# Patient Record
Sex: Female | Born: 1935 | Race: White | Hispanic: No | Smoking: Never smoker
Health system: Southern US, Community
[De-identification: ages and names within clinical notes are randomized; demographics above are authoritative.]

## PROBLEM LIST (undated history)

## (undated) DIAGNOSIS — N281 Cyst of kidney, acquired: Secondary | ICD-10-CM

## (undated) DIAGNOSIS — M4800 Spinal stenosis, site unspecified: Secondary | ICD-10-CM

## (undated) DIAGNOSIS — E119 Type 2 diabetes mellitus without complications: Secondary | ICD-10-CM

## (undated) DIAGNOSIS — F329 Major depressive disorder, single episode, unspecified: Secondary | ICD-10-CM

## (undated) DIAGNOSIS — E1140 Type 2 diabetes mellitus with diabetic neuropathy, unspecified: Secondary | ICD-10-CM

## (undated) DIAGNOSIS — I2510 Atherosclerotic heart disease of native coronary artery without angina pectoris: Secondary | ICD-10-CM

## (undated) DIAGNOSIS — I700 Atherosclerosis of aorta: Secondary | ICD-10-CM

## (undated) DIAGNOSIS — I519 Heart disease, unspecified: Secondary | ICD-10-CM

## (undated) DIAGNOSIS — R55 Syncope and collapse: Secondary | ICD-10-CM

## (undated) DIAGNOSIS — R32 Unspecified urinary incontinence: Secondary | ICD-10-CM

## (undated) DIAGNOSIS — R011 Cardiac murmur, unspecified: Secondary | ICD-10-CM

## (undated) DIAGNOSIS — I10 Essential (primary) hypertension: Secondary | ICD-10-CM

## (undated) DIAGNOSIS — M1990 Unspecified osteoarthritis, unspecified site: Secondary | ICD-10-CM

## (undated) DIAGNOSIS — D649 Anemia, unspecified: Secondary | ICD-10-CM

## (undated) DIAGNOSIS — R51 Headache: Secondary | ICD-10-CM

## (undated) DIAGNOSIS — I200 Unstable angina: Secondary | ICD-10-CM

## (undated) DIAGNOSIS — R112 Nausea with vomiting, unspecified: Secondary | ICD-10-CM

## (undated) DIAGNOSIS — Z955 Presence of coronary angioplasty implant and graft: Secondary | ICD-10-CM

## (undated) DIAGNOSIS — K219 Gastro-esophageal reflux disease without esophagitis: Secondary | ICD-10-CM

## (undated) DIAGNOSIS — G4730 Sleep apnea, unspecified: Secondary | ICD-10-CM

## (undated) DIAGNOSIS — F419 Anxiety disorder, unspecified: Secondary | ICD-10-CM

## (undated) DIAGNOSIS — Z951 Presence of aortocoronary bypass graft: Secondary | ICD-10-CM

## (undated) DIAGNOSIS — N3941 Urge incontinence: Secondary | ICD-10-CM

## (undated) DIAGNOSIS — G4700 Insomnia, unspecified: Secondary | ICD-10-CM

## (undated) DIAGNOSIS — R519 Headache, unspecified: Secondary | ICD-10-CM

## (undated) DIAGNOSIS — N8110 Cystocele, unspecified: Secondary | ICD-10-CM

## (undated) DIAGNOSIS — G4733 Obstructive sleep apnea (adult) (pediatric): Secondary | ICD-10-CM

## (undated) DIAGNOSIS — E669 Obesity, unspecified: Secondary | ICD-10-CM

## (undated) DIAGNOSIS — R0600 Dyspnea, unspecified: Secondary | ICD-10-CM

## (undated) DIAGNOSIS — N3020 Other chronic cystitis without hematuria: Secondary | ICD-10-CM

## (undated) DIAGNOSIS — A419 Sepsis, unspecified organism: Secondary | ICD-10-CM

## (undated) DIAGNOSIS — Z9889 Other specified postprocedural states: Secondary | ICD-10-CM

## (undated) DIAGNOSIS — I4891 Unspecified atrial fibrillation: Secondary | ICD-10-CM

## (undated) DIAGNOSIS — F32A Depression, unspecified: Secondary | ICD-10-CM

## (undated) DIAGNOSIS — E785 Hyperlipidemia, unspecified: Secondary | ICD-10-CM

## (undated) HISTORY — DX: Major depressive disorder, single episode, unspecified: F32.9

## (undated) HISTORY — PX: BUNIONECTOMY: SHX129

## (undated) HISTORY — DX: Obesity, unspecified: E66.9

## (undated) HISTORY — DX: Urge incontinence: N39.41

## (undated) HISTORY — DX: Gastro-esophageal reflux disease without esophagitis: K21.9

## (undated) HISTORY — PX: SEPTOPLASTY: SUR1290

## (undated) HISTORY — DX: Depression, unspecified: F32.A

## (undated) HISTORY — DX: Anxiety disorder, unspecified: F41.9

## (undated) HISTORY — PX: CHOLECYSTECTOMY: SHX55

## (undated) HISTORY — DX: Unspecified urinary incontinence: R32

## (undated) HISTORY — DX: Other chronic cystitis without hematuria: N30.20

## (undated) HISTORY — DX: Headache, unspecified: R51.9

## (undated) HISTORY — DX: Essential (primary) hypertension: I10

## (undated) HISTORY — DX: Headache: R51

## (undated) HISTORY — DX: Heart disease, unspecified: I51.9

## (undated) HISTORY — DX: Anemia, unspecified: D64.9

## (undated) HISTORY — DX: Unspecified osteoarthritis, unspecified site: M19.90

## (undated) HISTORY — DX: Hyperlipidemia, unspecified: E78.5

## (undated) HISTORY — PX: TOTAL KNEE ARTHROPLASTY: SHX125

## (undated) HISTORY — DX: Sleep apnea, unspecified: G47.30

## (undated) HISTORY — PX: CARDIAC SURGERY: SHX584

## (undated) HISTORY — DX: Cardiac murmur, unspecified: R01.1

## (undated) HISTORY — PX: JOINT REPLACEMENT: SHX530

## (undated) SURGERY — LOOP RECORDER INSERTION
Anesthesia: Moderate Sedation

---

## 1998-10-12 ENCOUNTER — Ambulatory Visit (HOSPITAL_COMMUNITY): Admission: RE | Admit: 1998-10-12 | Discharge: 1998-10-12 | Payer: Self-pay | Admitting: Obstetrics & Gynecology

## 2002-06-19 HISTORY — PX: BREAST BIOPSY: SHX20

## 2003-12-17 ENCOUNTER — Ambulatory Visit (HOSPITAL_COMMUNITY): Admission: RE | Admit: 2003-12-17 | Discharge: 2003-12-17 | Payer: Self-pay | Admitting: Neurology

## 2004-04-07 ENCOUNTER — Ambulatory Visit (HOSPITAL_COMMUNITY): Admission: RE | Admit: 2004-04-07 | Discharge: 2004-04-07 | Payer: Self-pay | Admitting: Neurology

## 2004-05-17 ENCOUNTER — Ambulatory Visit (HOSPITAL_COMMUNITY): Admission: RE | Admit: 2004-05-17 | Discharge: 2004-05-17 | Payer: Self-pay | Admitting: Neurology

## 2004-06-09 ENCOUNTER — Ambulatory Visit: Payer: Self-pay | Admitting: Internal Medicine

## 2004-07-14 ENCOUNTER — Ambulatory Visit: Payer: Self-pay | Admitting: Internal Medicine

## 2004-08-09 ENCOUNTER — Ambulatory Visit: Payer: Self-pay | Admitting: Obstetrics and Gynecology

## 2004-08-17 ENCOUNTER — Ambulatory Visit: Payer: Self-pay | Admitting: Internal Medicine

## 2005-10-03 ENCOUNTER — Ambulatory Visit: Payer: Self-pay | Admitting: Internal Medicine

## 2006-02-13 ENCOUNTER — Ambulatory Visit: Payer: Self-pay | Admitting: Gastroenterology

## 2006-10-10 ENCOUNTER — Ambulatory Visit: Payer: Self-pay | Admitting: Gastroenterology

## 2006-12-18 ENCOUNTER — Ambulatory Visit: Payer: Self-pay | Admitting: Internal Medicine

## 2008-01-03 ENCOUNTER — Ambulatory Visit: Payer: Self-pay | Admitting: Internal Medicine

## 2008-09-28 ENCOUNTER — Ambulatory Visit: Payer: Self-pay | Admitting: Specialist

## 2008-10-08 ENCOUNTER — Inpatient Hospital Stay: Payer: Self-pay | Admitting: Specialist

## 2009-01-11 ENCOUNTER — Ambulatory Visit: Payer: Self-pay | Admitting: Gastroenterology

## 2009-02-19 ENCOUNTER — Ambulatory Visit: Payer: Self-pay | Admitting: Internal Medicine

## 2009-08-21 ENCOUNTER — Inpatient Hospital Stay: Payer: Self-pay | Admitting: Internal Medicine

## 2010-04-07 ENCOUNTER — Ambulatory Visit: Payer: Self-pay | Admitting: Internal Medicine

## 2010-04-15 ENCOUNTER — Ambulatory Visit: Payer: Self-pay | Admitting: Internal Medicine

## 2010-08-05 ENCOUNTER — Ambulatory Visit: Payer: Self-pay | Admitting: Internal Medicine

## 2010-08-05 DIAGNOSIS — I2510 Atherosclerotic heart disease of native coronary artery without angina pectoris: Secondary | ICD-10-CM

## 2010-08-05 HISTORY — DX: Atherosclerotic heart disease of native coronary artery without angina pectoris: I25.10

## 2010-08-05 HISTORY — PX: CORONARY ANGIOPLASTY WITH STENT PLACEMENT: SHX49

## 2010-09-28 ENCOUNTER — Encounter: Payer: Self-pay | Admitting: Cardiology

## 2010-10-18 ENCOUNTER — Encounter: Payer: Self-pay | Admitting: Cardiology

## 2010-11-03 ENCOUNTER — Ambulatory Visit: Payer: Self-pay | Admitting: Specialist

## 2010-11-18 ENCOUNTER — Encounter: Payer: Self-pay | Admitting: Cardiology

## 2010-12-07 ENCOUNTER — Ambulatory Visit: Payer: Self-pay | Admitting: Pain Medicine

## 2010-12-15 ENCOUNTER — Ambulatory Visit: Payer: Self-pay | Admitting: Pain Medicine

## 2011-01-02 ENCOUNTER — Ambulatory Visit: Payer: Self-pay | Admitting: Pain Medicine

## 2011-01-17 ENCOUNTER — Ambulatory Visit: Payer: Self-pay | Admitting: Pain Medicine

## 2011-02-01 ENCOUNTER — Ambulatory Visit: Payer: Self-pay | Admitting: Pain Medicine

## 2011-02-18 ENCOUNTER — Inpatient Hospital Stay: Payer: Self-pay | Admitting: Internal Medicine

## 2011-06-26 ENCOUNTER — Ambulatory Visit: Payer: Self-pay | Admitting: Internal Medicine

## 2011-06-29 LAB — TROPONIN I
Troponin-I: <0.02 ng/mL
Troponin-I: <0.02 ng/mL

## 2011-06-29 LAB — COMPREHENSIVE METABOLIC PANEL
Albumin: 3.7 g/dL (ref 3.4–5.0)
Alkaline Phosphatase: 75 U/L (ref 50–136)
Anion Gap: 6 — ABNORMAL LOW (ref 7–16)
BUN: 13 mg/dL (ref 7–18)
Bilirubin,Total: 0.4 mg/dL (ref 0.2–1.0)
Calcium, Total: 8.8 mg/dL (ref 8.5–10.1)
Chloride: 95 mmol/L — ABNORMAL LOW (ref 98–107)
Co2: 29 mmol/L (ref 21–32)
Creatinine: 0.77 mg/dL (ref 0.60–1.30)
EGFR (African American): >60
EGFR (Non-African Amer.): >60
Glucose: 170 mg/dL — ABNORMAL HIGH (ref 65–99)
Osmolality: 265 (ref 275–301)
Potassium: 4.2 mmol/L (ref 3.5–5.1)
SGOT(AST): 16 U/L (ref 15–37)
SGPT (ALT): 20 U/L
Sodium: 130 mmol/L — ABNORMAL LOW (ref 136–145)
Total Protein: 7.1 g/dL (ref 6.4–8.2)

## 2011-06-29 LAB — CBC
HCT: 38.4 % (ref 35.0–47.0)
HGB: 12.7 g/dL (ref 12.0–16.0)
MCH: 26.3 pg (ref 26.0–34.0)
MCHC: 33 g/dL (ref 32.0–36.0)
MCV: 80 fL (ref 80–100)
Platelet: 186 10*3/uL (ref 150–440)
RBC: 4.81 10*6/uL (ref 3.80–5.20)
RDW: 14.8 % — ABNORMAL HIGH (ref 11.5–14.5)
WBC: 6.9 10*3/uL (ref 3.6–11.0)

## 2011-06-29 LAB — CK TOTAL AND CKMB (NOT AT ARMC)
CK, Total: 71 U/L (ref 21–215)
CK-MB: 0.8 ng/mL (ref 0.5–3.6)

## 2011-06-30 LAB — BASIC METABOLIC PANEL
Anion Gap: 10 (ref 7–16)
BUN: 14 mg/dL (ref 7–18)
Calcium, Total: 8.8 mg/dL (ref 8.5–10.1)
Chloride: 101 mmol/L (ref 98–107)
Co2: 28 mmol/L (ref 21–32)
Creatinine: 0.6 mg/dL (ref 0.60–1.30)
EGFR (African American): >60
EGFR (Non-African Amer.): >60
Glucose: 111 mg/dL — ABNORMAL HIGH (ref 65–99)
Osmolality: 279 (ref 275–301)
Potassium: 3.7 mmol/L (ref 3.5–5.1)
Sodium: 139 mmol/L (ref 136–145)

## 2011-06-30 LAB — CBC WITH DIFFERENTIAL/PLATELET
Basophil #: 0 10*3/uL (ref 0.0–0.1)
Basophil %: 0.5 %
Eosinophil #: 0.2 10*3/uL (ref 0.0–0.7)
Eosinophil %: 5.5 %
HCT: 38.2 % (ref 35.0–47.0)
HGB: 12.5 g/dL (ref 12.0–16.0)
Lymphocyte #: 1.6 10*3/uL (ref 1.0–3.6)
Lymphocyte %: 36 %
MCH: 26.1 pg (ref 26.0–34.0)
MCHC: 32.7 g/dL (ref 32.0–36.0)
MCV: 80 fL (ref 80–100)
Monocyte #: 0.5 10*3/uL (ref 0.0–0.7)
Monocyte %: 10.9 %
Neutrophil #: 2.1 10*3/uL (ref 1.4–6.5)
Neutrophil %: 47.1 %
Platelet: 186 10*3/uL (ref 150–440)
RBC: 4.79 10*6/uL (ref 3.80–5.20)
RDW: 15.3 % — ABNORMAL HIGH (ref 11.5–14.5)
WBC: 4.4 10*3/uL (ref 3.6–11.0)

## 2011-06-30 LAB — TROPONIN I: Troponin-I: <0.02 ng/mL

## 2011-06-30 LAB — LIPID PANEL
Cholesterol: 141 mg/dL (ref 0–200)
HDL Cholesterol: 63 mg/dL — ABNORMAL HIGH (ref 40–60)
Ldl Cholesterol, Calc: 64 mg/dL (ref 0–100)
Triglycerides: 71 mg/dL (ref 0–200)
VLDL Cholesterol, Calc: 14 mg/dL (ref 5–40)

## 2011-06-30 LAB — HEMOGLOBIN A1C: Hemoglobin A1C: 7.7 % — ABNORMAL HIGH (ref 4.2–6.3)

## 2011-06-30 LAB — CK TOTAL AND CKMB (NOT AT ARMC)
CK, Total: 54 U/L (ref 21–215)
CK-MB: <0.5 ng/mL — ABNORMAL LOW (ref 0.5–3.6)

## 2011-07-01 ENCOUNTER — Inpatient Hospital Stay: Payer: Self-pay | Admitting: Internal Medicine

## 2011-07-03 HISTORY — PX: LEFT HEART CATH AND CORONARY ANGIOGRAPHY: CATH118249

## 2011-07-03 LAB — BASIC METABOLIC PANEL
Anion Gap: 10 (ref 7–16)
BUN: 12 mg/dL (ref 7–18)
Calcium, Total: 8.9 mg/dL (ref 8.5–10.1)
Chloride: 103 mmol/L (ref 98–107)
Co2: 26 mmol/L (ref 21–32)
Creatinine: 0.69 mg/dL (ref 0.60–1.30)
EGFR (African American): >60
EGFR (Non-African Amer.): >60
Glucose: 127 mg/dL — ABNORMAL HIGH (ref 65–99)
Osmolality: 279 (ref 275–301)
Potassium: 4 mmol/L (ref 3.5–5.1)
Sodium: 139 mmol/L (ref 136–145)

## 2012-01-12 ENCOUNTER — Ambulatory Visit: Payer: Self-pay | Admitting: Gastroenterology

## 2012-02-08 ENCOUNTER — Ambulatory Visit: Payer: Self-pay | Admitting: Gastroenterology

## 2012-02-14 LAB — PATHOLOGY REPORT

## 2012-02-22 ENCOUNTER — Emergency Department: Payer: Self-pay | Admitting: Emergency Medicine

## 2012-02-22 LAB — CBC WITH DIFFERENTIAL/PLATELET
Basophil #: 0 10*3/uL (ref 0.0–0.1)
Basophil %: 0.3 %
Eosinophil #: 0 10*3/uL (ref 0.0–0.7)
Eosinophil %: 0 %
HCT: 39.4 % (ref 35.0–47.0)
HGB: 13.3 g/dL (ref 12.0–16.0)
Lymphocyte #: 0.6 10*3/uL — ABNORMAL LOW (ref 1.0–3.6)
Lymphocyte %: 5.8 %
MCH: 26.1 pg (ref 26.0–34.0)
MCHC: 33.8 g/dL (ref 32.0–36.0)
MCV: 77 fL — ABNORMAL LOW (ref 80–100)
Monocyte #: 1 x10 3/mm — ABNORMAL HIGH (ref 0.2–0.9)
Monocyte %: 9.1 %
Neutrophil #: 9.4 10*3/uL — ABNORMAL HIGH (ref 1.4–6.5)
Neutrophil %: 84.8 %
Platelet: 189 10*3/uL (ref 150–440)
RBC: 5.11 10*6/uL (ref 3.80–5.20)
RDW: 15.2 % — ABNORMAL HIGH (ref 11.5–14.5)
WBC: 11.1 10*3/uL — ABNORMAL HIGH (ref 3.6–11.0)

## 2012-02-22 LAB — URINALYSIS, COMPLETE
Bacteria: NONE SEEN
Bilirubin,UR: NEGATIVE
Glucose,UR: NEGATIVE mg/dL (ref 0–75)
Nitrite: NEGATIVE
Ph: 6 (ref 4.5–8.0)
Protein: NEGATIVE
RBC,UR: 10 /HPF (ref 0–5)
Specific Gravity: 1.01 (ref 1.003–1.030)
Squamous Epithelial: NONE SEEN
WBC UR: 110 /HPF (ref 0–5)

## 2012-02-22 LAB — BASIC METABOLIC PANEL
Anion Gap: 10 (ref 7–16)
BUN: 6 mg/dL — ABNORMAL LOW (ref 7–18)
Calcium, Total: 9.1 mg/dL (ref 8.5–10.1)
Chloride: 91 mmol/L — ABNORMAL LOW (ref 98–107)
Co2: 27 mmol/L (ref 21–32)
Creatinine: 0.65 mg/dL (ref 0.60–1.30)
EGFR (African American): >60
EGFR (Non-African Amer.): >60
Glucose: 192 mg/dL — ABNORMAL HIGH (ref 65–99)
Osmolality: 260 (ref 275–301)
Potassium: 3.2 mmol/L — ABNORMAL LOW (ref 3.5–5.1)
Sodium: 128 mmol/L — ABNORMAL LOW (ref 136–145)

## 2012-02-23 LAB — URINE CULTURE

## 2012-03-11 DIAGNOSIS — N3941 Urge incontinence: Secondary | ICD-10-CM | POA: Insufficient documentation

## 2012-03-11 DIAGNOSIS — N3020 Other chronic cystitis without hematuria: Secondary | ICD-10-CM | POA: Insufficient documentation

## 2012-06-06 ENCOUNTER — Ambulatory Visit: Payer: Self-pay | Admitting: Ophthalmology

## 2012-06-10 ENCOUNTER — Ambulatory Visit: Payer: Self-pay | Admitting: Ophthalmology

## 2012-06-10 LAB — POTASSIUM: Potassium: 4.2 mmol/L (ref 3.5–5.1)

## 2012-06-17 ENCOUNTER — Ambulatory Visit: Payer: Self-pay | Admitting: Ophthalmology

## 2012-07-09 ENCOUNTER — Ambulatory Visit: Payer: Self-pay | Admitting: Ophthalmology

## 2012-07-09 LAB — POTASSIUM: Potassium: 4.5 mmol/L (ref 3.5–5.1)

## 2012-07-15 ENCOUNTER — Ambulatory Visit: Payer: Self-pay | Admitting: Ophthalmology

## 2012-09-03 ENCOUNTER — Ambulatory Visit: Payer: Self-pay | Admitting: Internal Medicine

## 2013-05-02 DIAGNOSIS — N281 Cyst of kidney, acquired: Secondary | ICD-10-CM | POA: Insufficient documentation

## 2013-11-06 ENCOUNTER — Ambulatory Visit: Payer: Self-pay | Admitting: Internal Medicine

## 2013-11-26 ENCOUNTER — Ambulatory Visit: Payer: Self-pay | Admitting: Otolaryngology

## 2013-12-01 DIAGNOSIS — E669 Obesity, unspecified: Secondary | ICD-10-CM | POA: Insufficient documentation

## 2013-12-01 DIAGNOSIS — I10 Essential (primary) hypertension: Secondary | ICD-10-CM | POA: Insufficient documentation

## 2013-12-16 ENCOUNTER — Ambulatory Visit: Payer: Self-pay | Admitting: Otolaryngology

## 2014-01-06 DIAGNOSIS — G4730 Sleep apnea, unspecified: Secondary | ICD-10-CM | POA: Insufficient documentation

## 2014-01-06 DIAGNOSIS — G4733 Obstructive sleep apnea (adult) (pediatric): Secondary | ICD-10-CM | POA: Insufficient documentation

## 2014-01-06 DIAGNOSIS — I2510 Atherosclerotic heart disease of native coronary artery without angina pectoris: Secondary | ICD-10-CM | POA: Insufficient documentation

## 2014-10-09 NOTE — Op Note (Signed)
DATE OF BIRTH:  Dec 02, 1935  DATE OF PROCEDURE:  07/15/2012  PREOPERATIVE DIAGNOSIS:  Cataract, right eye.   POSTOPERATIVE DIAGNOSIS:  Cataract, right eye.   PROCEDURE PERFORMED:  Extracapsular cataract extraction using phacoemulsification with placement of an Alcon SN6CWS 15.5-diopter posterior chamber lens, serial number [id number].   ANESTHESIA:  Four percent lidocaine, 0.75% Marcaine, a 50-50 mixture with 10 units/mL of Hylenex added, given as a peribulbar.   ANESTHESIOLOGIST:  Dr. Boston Service.   COMPLICATIONS:  None.   ESTIMATED BLOOD LOSS:  Less than 1 mL.   DESCRIPTION OF PROCEDURE:  The patient was brought to the operating room and given a peribulbar block.  The patient was then prepped and draped in the usual fashion.  The vertical rectus muscles were imbricated using 5-0 silk sutures.  These sutures were then clamped to the sterile drapes as bridle sutures.  A limbal peritomy was performed extending two clock hours and hemostasis was obtained with cautery.  A partial thickness scleral groove was made at the surgical limbus and dissected anteriorly in a lamellar dissection using an Alcon crescent knife.  The anterior chamber was entered superonasally with a Superblade and through the lamellar dissection with a 2.6 mm keratome.  DisCoVisc was used to replace the aqueous and a continuous tear capsulorrhexis was carried out.  Hydrodissection and hydrodelineation were carried out with balanced salt and a 27 gauge canula.  The nucleus was rotated to confirm the effectiveness of the hydrodissection.  Phacoemulsification was carried out using a divide-and-conquer technique.  Total ultrasound time was 1 minute and 42 seconds with an average power of 18.5 percent, CDE of 31.32.  Irrigation/aspiration was used to remove the residual cortex.  DisCoVisc was used to inflate the capsule and the internal incision was enlarged to 3 mm with the crescent knife.  The intraocular lens was folded and  inserted into the capsular bag using the AcrySert delivery system.  Irrigation/aspiration was used to remove the residual DisCoVisc.  Miostat was injected into the anterior chamber through the paracentesis track to inflate the anterior chamber and induce miosis.  One-tenth of a milliliter of cefuroxime was injected into the anterior chamber via the paracentesis.  The wound was checked for leaks and none were found. The conjunctiva was closed with cautery and the bridle sutures were removed.  Two drops of 0.3% Vigamox were placed on the eye.   An eye shield was placed on the eye.  The patient was discharged to the recovery room in good condition.    ____________________________ Loura Back Tykel Badie, MD sad:ms D: 07/15/2012 13:15:01 ET T: 07/15/2012 22:38:10 ET JOB#: [id number]  cc: Remo Lipps A. Luccas Towell, MD, <Dictator> Martie Lee MD ELECTRONICALLY SIGNED 07/22/2012 12:44

## 2014-10-09 NOTE — Op Note (Signed)
PATIENT NAME:  Brandi [name], Brandi [name] MR#:  [id number] DATE OF BIRTH:  04-06-1936  DATE OF PROCEDURE:  06/17/2012  PREOPERATIVE DIAGNOSIS: Cataract left eye.   POSTOPERATIVE DIAGNOSIS: Cataract left eye.   PROCEDURE PERFORMED: Extracapsular cataract extraction using phacoemulsification with placement of Alcon SN6CWS, 16.0 diopter posterior chamber lens, serial number [id number].   SURGEON: Brandi Back. Oyuki Hogan, M.D.   ANESTHESIA: 4% lidocaine and 0.75% Marcaine 50-50 mixture with 10 units/mL of Hylenex added given as a peribulbar.   ANESTHESIOLOGIST: Dr. Boston Service.   COMPLICATIONS: None.   ESTIMATED BLOOD LOSS: Less than 1 mL.   DESCRIPTION OF PROCEDURE:  The patient was brought to the operating room and given a peribulbar block.  The patient was then prepped and draped in the usual fashion.  The vertical rectus muscles were imbricated using 5-0 silk sutures.  These sutures were then clamped to the sterile drapes as bridle sutures.  A limbal peritomy was performed extending two clock hours and hemostasis was obtained with cautery.  A partial thickness scleral groove was made at the surgical limbus and dissected anteriorly in a lamellar dissection using an Alcon crescent knife.  The anterior chamber was entered supero-temporally with a Superblade and through the lamellar dissection with a 2.6 mm keratome.  DisCoVisc was used to replace the aqueous and a continuous tear capsulorrhexis was carried out.  Hydrodissection and hydrodelineation were carried out with balanced salt and a 27 gauge canula.  The nucleus was rotated to confirm the effectiveness of the hydrodissection.  Phacoemulsification was carried out using a divide-and-conquer technique.  Total ultrasound time was 1 minute and 11 seconds with an average power of 24.3%.  Irrigation/aspiration was used to remove the residual cortex.  DisCoVisc was used to inflate the capsule and the internal incision was enlarged to 3 mm with the crescent  knife.  The intraocular lens was folded and inserted into the capsular bag using the AcrySert delivery system.  Irrigation/aspiration was used to remove the residual DisCoVisc.  Miostat was injected into the anterior chamber through the paracentesis track to inflate the anterior chamber and induce miosis.  The wound was checked for leaks and none were found. The conjunctiva was closed with cautery and the bridle sutures were removed.  Two drops of 0.3% Vigamox were placed on the eye.   An eye shield was placed on the eye.  The patient was discharged to the recovery room in good condition.  CDE 28.82.  No suture was placed.   Keflex 0.1 mL was introduced into the anterior chamber with the 27-gauge cannula pointed underneath the capsular edge.    ____________________________ Brandi Back Cortavius Montesinos, MD sad:cs D: 06/17/2012 13:19:36 ET T: 06/17/2012 14:52:06 ET JOB#: [id number]  cc: Remo Lipps A. Amnah Breuer, MD, <Dictator> Martie Lee MD ELECTRONICALLY SIGNED 06/24/2012 13:46

## 2014-10-11 NOTE — Consult Note (Signed)
PATIENT NAME:  Brandi [name], MURREN MR#:  [id number] DATE OF BIRTH:  1936/01/24  DATE OF CONSULTATION:  06/30/2011  REFERRING PHYSICIAN:   CONSULTING PHYSICIAN:  Isaias Cowman, MD  PRIMARY CARE PHYSICIAN: Dr. Gilford Rile  CARDIOLOGIST: Dr. Ubaldo Glassing.   CHIEF COMPLAINT: Chest pain.   HISTORY OF PRESENT ILLNESS: The patient is a 79 year old female referred for evaluation of chest pain and positive stress test. The patient has known coronary artery disease, status post PCI and stent to proximal left anterior descending coronary artery in 2010. The patient reports that she was in her usual state of health until 06/28/2011 when she developed substernal chest pain described as pressure sensation associated with nausea without radiation. She presented to [facility] Room where EKG was nondiagnostic. The patient ruled out for myocardial infarction by CPK isoenzymes and troponin. The patient underwent Lexiscan sestamibi study today which revealed evidence for anterior wall ischemia.   PAST MEDICAL HISTORY:  1. Status post Xience 3.0 x 23 mm drug-eluting stent proximal left anterior descending coronary artery in 2010.  2. Hypertension.  3. Hyperlipidemia.  4. Diabetes.  5. Hypothyroidism.  6. Obesity.    MEDICATIONS ON ADMISSION:  1. Aspirin 81 mg daily.  2. Plavix 75 mg daily.  3. Diovan 160 mg daily.  4. Klor-Con 20 milliequivalents daily.  5. Simvastatin 40 mg daily.  6. Verapamil 30 mg daily.  7. Alprazolam 0.25 mg b.i.d. p.r.n.  8. Biotin 1 tab b.i.d.  9. Calcium 600 mg with vitamin D b.i.d.  10. Lyrica 75 mg b.i.d.  11. Meloxicam 15 mg daily.  12. Metformin 500 mg daily.  13. Oxybutynin 15 mg daily.  14. PreserVision 1 tab b.i.d.  15. Vitamin C 1500 mg daily.   SOCIAL HISTORY: The patient is a widow. She currently lives with her sons and mother. She denies tobacco abuse.   FAMILY HISTORY: No immediate family history for coronary artery disease or myocardial infarction.   REVIEW OF  SYSTEMS: CONSTITUTIONAL: No fever or chills. EYES: No blurry vision. EARS: No hearing loss. RESPIRATORY: No shortness of breath. CARDIOVASCULAR: Chest pain as described above. GASTROINTESTINAL: Mild nausea. GENITOURINARY: The patient denies dysuria or hematuria. ENDOCRINE: The patient denies polyuria or polydipsia. MUSCULOSKELETAL: The patient has arthritis but denies myalgias. NEUROLOGICAL: The patient does have disk disease and neuropathy in both arms. PSYCHIATRIC: The patient denies anxiety or depression.   PHYSICAL EXAMINATION:  VITAL SIGNS: Blood pressure 166/82, pulse 66, respirations 20, temperature 97.8, pulse oximetry 94%.   HEENT: Pupils equal and reactive to light and accommodation.   NECK: Supple without thyromegaly.   LUNGS: Clear.   HEART: Normal jugular venous pressure. Normal point of maximal impulse. Regular rate and rhythm. Normal S1, S2. No appreciable gallop, murmur, or rub.   ABDOMEN: Soft and nontender. Pulses were intact bilaterally.   MUSCULOSKELETAL: Normal muscle tone.   NEUROLOGIC: The patient is alert and oriented x3. Motor and sensory both grossly intact.   IMPRESSION: This is a 79 year old female with known coronary artery disease who presents with chest pain, has ruled out for myocardial infarction. Lexiscan sestamibi study reveals evidence for anterior wall ischemia.   RECOMMENDATIONS:  1. Agree with current therapy.  2. Defer full dose anticoagulation at this time since the patient is chest pain free.  3. Proceed with cardiac catheterization with selective coronary arteriography on 07/03/2011. The risks, benefits, and alternatives were explained and informed written consent obtained.    ____________________________ Isaias Cowman, MD ap:rbg D: 06/30/2011 15:53:34 ET T: 06/30/2011 16:31:05 ET  JOB#: [id number]  cc: Isaias Cowman, MD, <Dictator> Isaias Cowman MD ELECTRONICALLY SIGNED 07/01/2011 10:04

## 2014-10-11 NOTE — H&P (Signed)
PATIENT NAME:  Brandi [name], Brandi [name] MR#:  [id number] DATE OF BIRTH:  17-Dec-1935  DATE OF ADMISSION:  06/29/2011  REFERRING PHYSICIAN: Fonnie Birkenhead, MD  PRIMARY CARE PHYSICIAN: Lisette Grinder III, MD  CHIEF COMPLAINT: Chest pain last night.   HISTORY OF PRESENT ILLNESS: The patient is a 79 year old Caucasian female with history of coronary artery disease status post stenting in the proximal LAD last year, hypertension, diabetes, and peptic ulcer disease who presents after being referred by her PCP to be evaluated in the ER as she had an episode of chest pain last night. The chest pain was in the sternum and it was more pressure like. The patient does not know how long it lasted and did not time it, however, she stated that it was not hours and it relieved by itself. She had some nausea at the same time however had radiation of this pain, no visual disturbances or dizziness. On arrival here her blood pressure was elevated. Currently her blood pressure is 187. She has no chest pain, shortness of breath, dizziness, or palpitations currently. The hospitalist services were contacted for admission and further evaluation.   PAST MEDICAL HISTORY:  1. Hyperlipidemia.  2. Peptic ulcer disease. 3. Fibrocystic disorder of the breast. 4. Obesity. 5. Urinary incontinence. 6. Macular degeneration. 7. Hypertension. 8. Coronary artery disease status post catheterization in 07/2008.   PAST SURGICAL HISTORY: 1. Right bunionectomy. 2. Nasal septal repair. 3. Laparoscopic cholecystectomy. 4. Bilateral knee replacement.   ALLERGIES: Accupril and Micardis.   CURRENT MEDICATIONS:  1. Alprazolam 0.25 mg twice a day as needed.  2. Aspirin 81 mg daily.  3. Biotin 1 tab p.o. twice a day. 4. Calcium 600 mg with vitamin D twice a day. 5. Diovan 160 mg daily.  6. Klor-Con 20 mEq p.o. twice a day. 7. Lyrica 75 mg twice a day. 8. Meloxicam 15 mg daily.  9. Metformin 500 mg daily.  10. Omeprazole 20 mg  daily. 11. Oxybutynin 15 mg daily, extended-release.  12. Plavix 75 mg daily.  13. PreserVision one tab twice a day. 14. Simvastatin 40 mg daily.  15. Verapamil 300 mg nightly.  16. Vitamin C 1500 mg daily.  17. Viibryd 20 mg at bedtime.   SOCIAL HISTORY: No tobacco, alcohol or drug use. She lives with her sons and mother. 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   FAMILY HISTORY: Cancer and mother with diabetes. Father died at a young age in an airplane crash.  REVIEW OF SYSTEMS: CONSTITUTIONAL: No fever or fatigue. No weakness or weight changes. EYES: No blurry vision or double vision. Has history of macular degeneration. ENT: No tinnitus or hearing loss. RESPIRATORY: No cough, wheezing, or hemoptysis. CARDIOVASCULAR: Chest pain as above. No orthopnea or edema. No arrhythmia or dyspnea on exertion. GASTROINTESTINAL: No nausea, vomiting, diarrhea, or abdominal pain. GENITOURINARY: The patient has a history of incontinence. No dysuria or hematuria. ENDOCRINE: No polyuria, nocturia, or thyroid problems. HEME/LYMPH: No anemia or easy bruising. SKIN: No rashes. MUSCULOSKELETAL: Chronic arthritis. PSYCH: No anxiety. There is history of depression.     PHYSICAL EXAMINATION:   VITAL SIGNS: Temperature 98, respiratory rate 18, pulse 70, blood pressure on arrival 183/86 and currently 187/ 94, and oxygen saturation 100% on room air.   GENERAL: The patient is a pleasant elderly female sitting in bed in no obvious distress.   HEENT: Normocephalic, atraumatic. Pupils are equal and reactive. Anicteric sclerae. Moist mucous membranes.   NECK: Supple. No thyroid tenderness.   HEART: S1 and S2  regular rate and rhythm. No murmurs, rubs, or gallops.   LUNGS: Clear to auscultation. No wheezing.   ABDOMEN: Soft, nontender, and nondistended. Positive bowel sounds.   EXTREMITIES: No significant edema. There are some varicose veins.   NEUROLOGIC: Strength 5/5 in all extremities. Cranial nerves II through XII  grossly intact.   LABS/STUDIES: Glucose 170 sodium 130, potassium 4.2, and chloride 95. LFTs within normal limits. Troponin negative x1. WBC 6.9, hemoglobin 12.7, hematocrit 38.4, and platelets 186.   EKG: Normal sinus rhythm. No acute ST elevations or depressions.   ASSESSMENT AND PLAN: We have a 79 year old Caucasian female with coronary artery disease status post stenting last year on Plavix and aspirin with hypertension, diabetes, and hyperlipidemia who presents with chest pain last night which has resolved. She has had no further chest pain. She has a negative troponin and EKG which does not show any acute ST elevations or depressions. However, the patient has multiple risk factors and stated that the chest pain was just like her previous episode of chest pain last year which ended up with the patient having a stent. At this point we would admit the patient for observation and rule out myocardial infarction with cyclic cardiac markers. Would admit the patient to telemetry with monitoring for arrhythmias. The patient has had an echocardiogram in September of last year showing ejection fraction of 45 to 50% and we would not repeat that. If the patient does rule out for acute myocardial infarction, we would order a stress test which could be done tomorrow. Would check a lipid profile as well. Cardiology can be consulted if the patient does rule in for myocardial infarction. For high blood pressure we would start amlodipine and continue her other outpatient medications and add hydralazine IV as needed. I would hold hydrochlorothiazide given that it was recently added and the patient has hyponatremia now. I would check a BMP tomorrow to reevaluate the hyponatremia. This is the likely culprit of the hyponatremia and I believe hydrochlorothiazide can be held for now. I would continue the patient's other medications.   CODE STATUS: FULL CODE.   TOTAL TIME SPENT: 45 minutes.   ____________________________ Vivien Presto, MD sa:slb D: 06/29/2011 16:34:41 ET T: 06/29/2011 17:19:37 ET JOB#: [id number]  cc: Vivien Presto, MD, <Dictator> John B. Sarina Ser, MD Karel Jarvis [facility] MD ELECTRONICALLY SIGNED 06/30/2011 20:38

## 2014-10-11 NOTE — Consult Note (Signed)
Brief Consult Note: Diagnosis: CP, known CAD prior PCI and stent prox LAD with anterior wall ischemia on Lexiscan-sestamibi.   Patient was seen by consultant.   Consult note dictated.   Comments: REC  Agree with current therapy, cardiac cath 07/03/11.  Electronic Signatures: Isaias Cowman (MD)  (Signed 11-Jan-13 15:55)  Authored: Brief Consult Note   Last Updated: 11-Jan-13 15:55 by Isaias Cowman (MD)

## 2014-10-11 NOTE — Discharge Summary (Signed)
PATIENT NAME:  Brandi [name], Brandi [name] MR#:  [id number] DATE OF BIRTH:  May 28, 1936  DATE OF ADMISSION:  06/29/2011 DATE OF DISCHARGE:  07/04/2011  HISTORY OF PRESENT ILLNESS: Brandi [name] is a 79 year old white lady with known coronary artery disease who was sent to the emergency room after she called the office stating that she had had substernal chest pain overnight that felt like an elephant sitting on her chest. The patient was evaluated in the ER by the hospitalist and admitted for further evaluation.   PAST MEDICAL HISTORY:  1. Coronary artery disease with known stenting of the LAD in the past.  2. History of hypertension.  3. Hyperlipidemia.  4. Peptic ulcer disease. 5. Obesity.  6. Macular degeneration.  7. Urinary incontinence.   ALLERGIES: Accupril and Micardis.   MEDICATIONS ON ADMISSION:  1. Alprazolam 0.25 mg twice a day p.r.n.  2. Aspirin 81 mg daily.  3. Diovan 160 mg daily.  4. Klor-Con 20 mEq twice a day. 5. Lyrica 75 mg twice a day. 6. Meloxicam 15 mg daily.  7. Metformin 500 mg daily.  8. Omeprazole 20 mg daily.  9. Oxybutynin 15 mg extended release daily.  10. Plavix 75 mg daily.  11. PreserVision 1 tablet twice a day. 12. Simvastatin 40 mg at bedtime.  13. Verapamil ER 300 mg daily.  14. Vitamin C 1500 mg daily. 15. Viibryd 20 mg at bedtime.   ADMISSION PHYSICAL EXAMINATION: Temperature was 98, respiratory rate 18, pulse 70, and blood pressure was 183/86. Oxygen saturation was 100% on room air. Admission physical examination as described by the admitting physician was totally within normal limits.   LABS/STUDIES: Admission laboratory included a CBC which showed a hemoglobin of 12.5 with a hematocrit of 38.2. Admission comprehensive metabolic panel showed a random blood sugar of 170, a sodium of 130, and a chloride of 95. Hemoglobin A1c on admission was 7.7%. Admission lipid panel showed triglycerides to be 71, HDL cholesterol 63, and LDL cholesterol was 64.   HOSPITAL  COURSE: The patient was admitted to the regular medical floor where she was placed on telemetry. Serial cardiac enzymes were obtained, all of which were normal. The patient underwent stress Myoview which was interpreted as being positive. She was seen in consultation by cardiology and underwent repeat cardiac catheterization. Cardiac catheterization revealed a patent stent in the proximal LAD with 40 to 50% stenosis in the mid LAD. Continued medical management was recommended as the patient had been asymptomatic since admission. The patient had rather variable blood pressure during her hospitalization. She had been on a diuretic as an outpatient which was discontinued because of her hyponatremia. Her Diovan was increased during the hospitalization, but it was decided to go back to her regular dose at the time of discharge and follow up in the office.   DISCHARGE DIAGNOSIS: Acute angina pectoris.   PROCEDURE: Cardiac catheterization.   DISCHARGE DISPOSITION: The patient is being discharged on a diabetic diet with activity as tolerated. No changes were made in her medications. She will follow up with Dr. Ubaldo Glassing in 1 to 2 weeks. She already has an appointment to see me on 07/31/2011 which time we will recheck her blood pressure. ____________________________ Hewitt Blade. Sarina Ser, MD jbw:slb D: 07/04/2011 08:02:00 ET     T: 07/05/2011 07:55:43 ET        JOB#: [id number] Lottie Mussel III MD ELECTRONICALLY SIGNED 07/07/2011 13:39

## 2014-12-23 ENCOUNTER — Other Ambulatory Visit: Payer: Self-pay | Admitting: Internal Medicine

## 2014-12-23 DIAGNOSIS — D510 Vitamin B12 deficiency anemia due to intrinsic factor deficiency: Secondary | ICD-10-CM | POA: Insufficient documentation

## 2014-12-23 DIAGNOSIS — Z1231 Encounter for screening mammogram for malignant neoplasm of breast: Secondary | ICD-10-CM

## 2014-12-23 DIAGNOSIS — F325 Major depressive disorder, single episode, in full remission: Secondary | ICD-10-CM | POA: Insufficient documentation

## 2014-12-30 ENCOUNTER — Ambulatory Visit
Admission: RE | Admit: 2014-12-30 | Discharge: 2014-12-30 | Disposition: A | Discharge status: Home / Self Care | Payer: Medicare PPO | Source: Ambulatory Visit | Attending: Internal Medicine | Admitting: Internal Medicine

## 2014-12-30 DIAGNOSIS — Z1231 Encounter for screening mammogram for malignant neoplasm of breast: Secondary | ICD-10-CM | POA: Diagnosis not present

## 2015-01-16 ENCOUNTER — Other Ambulatory Visit: Payer: Self-pay

## 2015-01-16 ENCOUNTER — Emergency Department: Payer: Medicare PPO

## 2015-01-16 ENCOUNTER — Encounter: Payer: Self-pay | Admitting: Emergency Medicine

## 2015-01-16 ENCOUNTER — Emergency Department
Admission: EM | Admit: 2015-01-16 | Discharge: 2015-01-16 | Disposition: A | Discharge status: Home / Self Care | Payer: Medicare PPO | Attending: Emergency Medicine | Admitting: Emergency Medicine

## 2015-01-16 DIAGNOSIS — I10 Essential (primary) hypertension: Secondary | ICD-10-CM | POA: Insufficient documentation

## 2015-01-16 DIAGNOSIS — R110 Nausea: Secondary | ICD-10-CM | POA: Diagnosis present

## 2015-01-16 DIAGNOSIS — R55 Syncope and collapse: Secondary | ICD-10-CM | POA: Diagnosis not present

## 2015-01-16 DIAGNOSIS — E119 Type 2 diabetes mellitus without complications: Secondary | ICD-10-CM | POA: Insufficient documentation

## 2015-01-16 DIAGNOSIS — N3090 Cystitis, unspecified without hematuria: Secondary | ICD-10-CM | POA: Diagnosis not present

## 2015-01-16 HISTORY — DX: Type 2 diabetes mellitus without complications: E11.9

## 2015-01-16 HISTORY — DX: Essential (primary) hypertension: I10

## 2015-01-16 LAB — CBC
HEMATOCRIT: 38.4 % (ref 35.0–47.0)
Hemoglobin: 12.5 g/dL (ref 12.0–16.0)
MCH: 24 pg — AB (ref 26.0–34.0)
MCHC: 32.5 g/dL (ref 32.0–36.0)
MCV: 74 fL — ABNORMAL LOW (ref 80.0–100.0)
PLATELETS: 190 10*3/uL (ref 150–440)
RBC: 5.19 MIL/uL (ref 3.80–5.20)
RDW: 16 % — ABNORMAL HIGH (ref 11.5–14.5)
WBC: 9.2 10*3/uL (ref 3.6–11.0)

## 2015-01-16 LAB — URINALYSIS COMPLETE WITH MICROSCOPIC (ARMC ONLY)
BILIRUBIN URINE: NEGATIVE
Bacteria, UA: NONE SEEN
Glucose, UA: NEGATIVE mg/dL
HGB URINE DIPSTICK: NEGATIVE
Ketones, ur: NEGATIVE mg/dL
NITRITE: NEGATIVE
PH: 5 (ref 5.0–8.0)
PROTEIN: 100 mg/dL — AB
Specific Gravity, Urine: 1.013 (ref 1.005–1.030)
Squamous Epithelial / LPF: NONE SEEN

## 2015-01-16 LAB — BASIC METABOLIC PANEL
ANION GAP: 12 (ref 5–15)
BUN: 15 mg/dL (ref 6–20)
CHLORIDE: 99 mmol/L — AB (ref 101–111)
CO2: 25 mmol/L (ref 22–32)
Calcium: 9.3 mg/dL (ref 8.9–10.3)
Creatinine, Ser: 0.97 mg/dL (ref 0.44–1.00)
GFR calc non Af Amer: 55 mL/min — ABNORMAL LOW (ref >60)
Glucose, Bld: 243 mg/dL — ABNORMAL HIGH (ref 65–99)
Potassium: 4.2 mmol/L (ref 3.5–5.1)
Sodium: 136 mmol/L (ref 135–145)

## 2015-01-16 LAB — TROPONIN I: Troponin I: <0.03 ng/mL (ref <0.031)

## 2015-01-16 MED ORDER — SULFAMETHOXAZOLE-TRIMETHOPRIM 800-160 MG PO TABS: 1.0000 | ORAL_TABLET | Freq: Two times a day (BID) | ORAL | Status: DC

## 2015-01-16 MED ORDER — DEXTROSE 5 % IV SOLN
1.0000 g | Freq: Once | INTRAVENOUS | Status: AC
  Administered 2015-01-16: 1 g via INTRAVENOUS
  Filled 2015-01-16: qty 10

## 2015-01-16 NOTE — ED Notes (Signed)
Patient reports she had frequent urination on Thursday night. Developed dysuria on Friday. States she was at [facility] to see if she had UTI. Was standing in line waiting and had syncopal episode. Patient did hit head at time of incident. Also reports nausea.

## 2015-01-16 NOTE — Discharge Instructions (Signed)
Syncope °Syncope is a medical term for fainting or passing out. This means you lose consciousness and drop to the ground. People are generally unconscious for less than 5 minutes. You may have some muscle twitches for up to 15 seconds before waking up and returning to normal. Syncope occurs more often in older adults, but it can happen to anyone. While most causes of syncope are not dangerous, syncope can be a sign of a serious medical problem. It is important to seek medical care.  °CAUSES  °Syncope is caused by a sudden drop in blood flow to the brain. The specific cause is often not determined. Factors that can bring on syncope include: °· Taking medicines that lower blood pressure. °· Sudden changes in posture, such as standing up quickly. °· Taking more medicine than prescribed. °· Standing in one place for too long. °· Seizure disorders. °· Dehydration and excessive exposure to heat. °· Low blood sugar (hypoglycemia). °· Straining to have a bowel movement. °· Heart disease, irregular heartbeat, or other circulatory problems. °· Fear, emotional distress, seeing blood, or severe pain. °SYMPTOMS  °Right before fainting, you may: °· Feel dizzy or light-headed. °· Feel nauseous. °· See all white or all black in your field of vision. °· Have cold, clammy skin. °DIAGNOSIS  °Your health care provider will ask about your symptoms, perform a physical exam, and perform an electrocardiogram (ECG) to record the electrical activity of your heart. Your health care provider may also perform other heart or blood tests to determine the cause of your syncope which may include: °· Transthoracic echocardiogram (TTE). During echocardiography, sound waves are used to evaluate how blood flows through your heart. °· Transesophageal echocardiogram (TEE). °· Cardiac monitoring. This allows your health care provider to monitor your heart rate and rhythm in real time. °· Holter monitor. This is a portable device that records your  heartbeat and can help diagnose heart arrhythmias. It allows your health care provider to track your heart activity for several days, if needed. °· Stress tests by exercise or by giving medicine that makes the heart beat faster. °TREATMENT  °In most cases, no treatment is needed. Depending on the cause of your syncope, your health care provider may recommend changing or stopping some of your medicines. °HOME CARE INSTRUCTIONS °· Have someone stay with you until you feel stable. °· Do not drive, use machinery, or play sports until your health care provider says it is okay. °· Keep all follow-up appointments as directed by your health care provider. °· Lie down right away if you start feeling like you might faint. Breathe deeply and steadily. Wait until all the symptoms have passed. °· Drink enough fluids to keep your urine clear or pale yellow. °· If you are taking blood pressure or heart medicine, get up slowly and take several minutes to sit and then stand. This can reduce dizziness. °SEEK IMMEDIATE MEDICAL CARE IF:  °· You have a severe headache. °· You have unusual pain in the chest, abdomen, or back. °· You are bleeding from your mouth or rectum, or you have black or tarry stool. °· You have an irregular or very fast heartbeat. °· You have pain with breathing. °· You have repeated fainting or seizure-like jerking during an episode. °· You faint when sitting or lying down. °· You have confusion. °· You have trouble walking. °· You have severe weakness. °· You have vision problems. °If you fainted, call your local emergency services (911 in [place] Do not drive   yourself to the hospital.  MAKE SURE YOU:  Understand these instructions.  Will watch your condition.  Will get help right away if you are not doing well or get worse. Document Released: 06/05/2005 Document Revised: 06/10/2013 Document Reviewed: 08/04/2011 [facility] Patient Information 2015 [place], [place]. This information is not intended to replace  advice given to you by your health care provider. Make sure you discuss any questions you have with your health care provider.  Syncope Syncope means a person passes out (faints). The person usually wakes up in less than 5 minutes. It is important to seek medical care for syncope. HOME CARE  Have someone stay with you until you feel normal.  Do not drive, use machines, or play sports until your doctor says it is okay.  Keep all doctor visits as told.  Lie down when you feel like you might pass out. Take deep breaths. Wait until you feel normal before standing up.  Drink enough fluids to keep your pee (urine) clear or pale yellow.  If you take blood pressure or heart medicine, get up slowly. Take several minutes to sit and then stand. GET HELP RIGHT AWAY IF:   You have a severe headache.  You have pain in the chest, belly (abdomen), or back.  You are bleeding from the mouth or butt (rectum).  You have black or tarry poop (stool).  You have an irregular or very fast heartbeat.  You have pain with breathing.  You keep passing out, or you have shaking (seizures) when you pass out.  You pass out when sitting or lying down.  You feel confused.  You have trouble walking.  You have severe weakness.  You have vision problems. If you fainted, call your local emergency services (911 in [place] Do not drive yourself to the hospital. MAKE SURE YOU:   Understand these instructions.  Will watch your condition.  Will get help right away if you are not doing well or get worse. Document Released: 11/22/2007 Document Revised: 12/05/2011 Document Reviewed: 08/04/2011 [facility] Patient Information 2015 [place], [place]. This information is not intended to replace advice given to you by your health care provider. Make sure you discuss any questions you have with your health care provider.  Urinary Tract Infection Urinary tract infections (UTIs) can develop anywhere along your urinary  tract. Your urinary tract is your body's drainage system for removing wastes and extra water. Your urinary tract includes two kidneys, two ureters, a bladder, and a urethra. Your kidneys are a pair of bean-shaped organs. Each kidney is about the size of your fist. They are located below your ribs, one on each side of your spine. CAUSES Infections are caused by microbes, which are microscopic organisms, including fungi, viruses, and bacteria. These organisms are so small that they can only be seen through a microscope. Bacteria are the microbes that most commonly cause UTIs. SYMPTOMS  Symptoms of UTIs may vary by age and gender of the patient and by the location of the infection. Symptoms in young women typically include a frequent and intense urge to urinate and a painful, burning feeling in the bladder or urethra during urination. Older women and men are more likely to be tired, shaky, and weak and have muscle aches and abdominal pain. A fever may mean the infection is in your kidneys. Other symptoms of a kidney infection include pain in your back or sides below the ribs, nausea, and vomiting. DIAGNOSIS To diagnose a UTI, your caregiver will ask you about  your symptoms. Your caregiver also will ask to provide a urine sample. The urine sample will be tested for bacteria and white blood cells. White blood cells are made by your body to help fight infection. TREATMENT  Typically, UTIs can be treated with medication. Because most UTIs are caused by a bacterial infection, they usually can be treated with the use of antibiotics. The choice of antibiotic and length of treatment depend on your symptoms and the type of bacteria causing your infection. HOME CARE INSTRUCTIONS  If you were prescribed antibiotics, take them exactly as your caregiver instructs you. Finish the medication even if you feel better after you have only taken some of the medication.  Drink enough water and fluids to keep your urine clear  or pale yellow.  Avoid caffeine, tea, and carbonated beverages. They tend to irritate your bladder.  Empty your bladder often. Avoid holding urine for long periods of time.  Empty your bladder before and after sexual intercourse.  After a bowel movement, women should cleanse from front to back. Use each tissue only once. SEEK MEDICAL CARE IF:   You have back pain.  You develop a fever.  Your symptoms do not begin to resolve within 3 days. SEEK IMMEDIATE MEDICAL CARE IF:   You have severe back pain or lower abdominal pain.  You develop chills.  You have nausea or vomiting.  You have continued burning or discomfort with urination. MAKE SURE YOU:   Understand these instructions.  Will watch your condition.  Will get help right away if you are not doing well or get worse. Document Released: 03/15/2005 Document Revised: 12/05/2011 Document Reviewed: 07/14/2011 [facility] Patient Information 2015 [place], [place]. This information is not intended to replace advice given to you by your health care provider. Make sure you discuss any questions you have with your health care provider.

## 2015-01-16 NOTE — ED Notes (Signed)
Pt informed to return if any life threatening symptoms occur.  

## 2015-01-16 NOTE — ED Provider Notes (Signed)
[facility] Emergency Department Provider Note     Time seen: ----------------------------------------- 12:11 PM on 01/16/2015 -----------------------------------------    I have reviewed the triage vital signs and the nursing notes.   HISTORY  Chief Complaint Loss of Consciousness and Nausea    HPI Brandi [name] is a 79 y.o. female who presents ER after she had a syncopal episode while waiting in line. Patient was going to see the doctor for having frequent urination and some dysuria this week. She is going to see if she had a UTI. Also had some nausea. Patient denies any fevers chills other complaints, states she woke up just not feeling well. She's had a syncopal episode the past but was never formally diagnosed with anything.   Past Medical History  Diagnosis Date  . Hypertension   . Diabetes mellitus without complication     There are no active problems to display for this patient.   Past Surgical History  Procedure Laterality Date  . Breast biopsy Right 2004    neg  . Cardiac surgery      Stent  . Cholecystectomy    . Joint replacement      TKR Bilateral  . Septoplasty    . Bunionectomy      Allergies Micardis and Accupril  Social History History  Substance Use Topics  . Smoking status: Never Smoker   . Smokeless tobacco: Never Used  . Alcohol Use: No    Review of Systems Constitutional: Negative for fever. Eyes: Negative for visual changes. ENT: Negative for sore throat. Cardiovascular: Negative for chest pain. Positive for syncope Respiratory: Negative for shortness of breath. Gastrointestinal: Negative for abdominal pain, vomiting and diarrhea. Genitourinary: Positive for dysuria Musculoskeletal: Negative for back pain. Skin: Negative for rash. Neurological: Negative for headaches, focal weakness or numbness.  10-point ROS otherwise negative.  ____________________________________________   PHYSICAL  EXAM:  VITAL SIGNS: ED Triage Vitals  Enc Vitals Group     BP 01/16/15 1149 140/72 mmHg     Pulse Rate 01/16/15 1149 92     Resp 01/16/15 1149 20     Temp 01/16/15 1149 98.3 F (36.8 C)     Temp Source 01/16/15 1149 Oral     SpO2 01/16/15 1149 95 %     Weight 01/16/15 1149 185 lb (83.915 kg)     Height 01/16/15 1149 5\' 1"  (1.549 m)     Head Cir --      Peak Flow --      Pain Score 01/16/15 1150 5     Pain Loc --      Pain Edu? --      Excl. in [place]? --     Constitutional: Alert and oriented. Well appearing and in no distress. Eyes: Conjunctivae are normal. PERRL. Normal extraocular movements. ENT   Head: Normocephalic and atraumatic.   Nose: No congestion/rhinnorhea.   Mouth/Throat: Mucous membranes are moist.   Neck: No stridor. Cardiovascular: Normal rate, regular rhythm. Normal and symmetric distal pulses are present in all extremities. No murmurs, rubs, or gallops. Respiratory: Normal respiratory effort without tachypnea nor retractions. Breath sounds are clear and equal bilaterally. No wheezes/rales/rhonchi. Gastrointestinal: Soft and nontender. No distention. No abdominal bruits. Musculoskeletal: Nontender with normal range of motion in all extremities. No joint effusions.  No lower extremity tenderness nor edema. Neurologic:  Normal speech and language. No gross focal neurologic deficits are appreciated. Speech is normal. No gait instability. Skin:  Skin is warm, dry and intact. No  rash noted. Psychiatric: Mood and affect are normal. Speech and behavior are normal. Patient exhibits appropriate insight and judgment. ____________________________________________  EKG: Interpreted by me. Normal sinus rhythm, LVH, normal PR interval, normal QRS with, normal QT interval. Borderline evidence of LVH  ____________________________________________  ED COURSE:  Pertinent labs & imaging results that were available during my care of the patient were reviewed by me and  considered in my medical decision making (see chart for details). We'll check cardiac labs, UA and reevaluate. ____________________________________________    LABS (pertinent positives/negatives)  Labs Reviewed  BASIC METABOLIC PANEL - Abnormal; Notable for the following:    Chloride 99 (*)    Glucose, Bld 243 (*)    GFR calc non Af Amer 55 (*)    All other components within normal limits  CBC - Abnormal; Notable for the following:    MCV 74.0 (*)    MCH 24.0 (*)    RDW 16.0 (*)    All other components within normal limits  URINALYSIS COMPLETEWITH MICROSCOPIC ([facility] ONLY) - Abnormal; Notable for the following:    Color, Urine YELLOW (*)    APPearance TURBID (*)    Protein, ur 100 (*)    Leukocytes, UA 3+ (*)    All other components within normal limits  URINE CULTURE  TROPONIN I    RADIOLOGY   Chest x-ray is unremarkable  ____________________________________________  FINAL ASSESSMENT AND PLAN  Syncope, cystitis  Plan: Patient with labs and imaging as dictated above. No clear etiology for the syncopal event. She does have significant UTI received 1 g of IV Rocephin. She is feeling better, ambulating here without difficulty. I offered her admission again and she declined. She is advised to follow-up with her doctor in 2 days reevaluation.   Earleen Newport, MD   Earleen Newport, MD 01/16/15 [phone]

## 2015-01-18 LAB — URINE CULTURE: Culture: >=100000

## 2015-04-08 ENCOUNTER — Other Ambulatory Visit: Payer: Self-pay | Admitting: Orthopedic Surgery

## 2015-04-08 DIAGNOSIS — M25551 Pain in right hip: Secondary | ICD-10-CM

## 2015-04-09 ENCOUNTER — Ambulatory Visit
Admission: RE | Admit: 2015-04-09 | Discharge: 2015-04-09 | Disposition: A | Discharge status: Home / Self Care | Payer: Medicare PPO | Source: Ambulatory Visit | Attending: Orthopedic Surgery | Admitting: Orthopedic Surgery

## 2015-04-09 DIAGNOSIS — M25551 Pain in right hip: Secondary | ICD-10-CM

## 2015-04-09 DIAGNOSIS — W19XXXA Unspecified fall, initial encounter: Secondary | ICD-10-CM | POA: Diagnosis not present

## 2015-04-09 DIAGNOSIS — S32501A Unspecified fracture of right pubis, initial encounter for closed fracture: Secondary | ICD-10-CM | POA: Diagnosis not present

## 2015-04-09 DIAGNOSIS — S3210XA Unspecified fracture of sacrum, initial encounter for closed fracture: Secondary | ICD-10-CM | POA: Diagnosis not present

## 2015-04-26 ENCOUNTER — Emergency Department: Payer: Medicare PPO

## 2015-04-26 ENCOUNTER — Emergency Department
Admission: EM | Admit: 2015-04-26 | Discharge: 2015-04-26 | Disposition: A | Discharge status: Home / Self Care | Payer: Medicare PPO | Attending: Emergency Medicine | Admitting: Emergency Medicine

## 2015-04-26 DIAGNOSIS — S329XXS Fracture of unspecified parts of lumbosacral spine and pelvis, sequela: Secondary | ICD-10-CM

## 2015-04-26 DIAGNOSIS — Z7982 Long term (current) use of aspirin: Secondary | ICD-10-CM | POA: Diagnosis not present

## 2015-04-26 DIAGNOSIS — S3289XS Fracture of other parts of pelvis, sequela: Secondary | ICD-10-CM | POA: Insufficient documentation

## 2015-04-26 DIAGNOSIS — S0093XA Contusion of unspecified part of head, initial encounter: Secondary | ICD-10-CM

## 2015-04-26 DIAGNOSIS — Z791 Long term (current) use of non-steroidal anti-inflammatories (NSAID): Secondary | ICD-10-CM | POA: Diagnosis not present

## 2015-04-26 DIAGNOSIS — I10 Essential (primary) hypertension: Secondary | ICD-10-CM | POA: Insufficient documentation

## 2015-04-26 DIAGNOSIS — S0083XA Contusion of other part of head, initial encounter: Secondary | ICD-10-CM | POA: Diagnosis not present

## 2015-04-26 DIAGNOSIS — Y998 Other external cause status: Secondary | ICD-10-CM | POA: Diagnosis not present

## 2015-04-26 DIAGNOSIS — W19XXXA Unspecified fall, initial encounter: Secondary | ICD-10-CM

## 2015-04-26 DIAGNOSIS — E119 Type 2 diabetes mellitus without complications: Secondary | ICD-10-CM | POA: Insufficient documentation

## 2015-04-26 DIAGNOSIS — Z79899 Other long term (current) drug therapy: Secondary | ICD-10-CM | POA: Diagnosis not present

## 2015-04-26 DIAGNOSIS — W228XXA Striking against or struck by other objects, initial encounter: Secondary | ICD-10-CM | POA: Diagnosis not present

## 2015-04-26 DIAGNOSIS — S0990XA Unspecified injury of head, initial encounter: Secondary | ICD-10-CM | POA: Diagnosis present

## 2015-04-26 DIAGNOSIS — Y9289 Other specified places as the place of occurrence of the external cause: Secondary | ICD-10-CM | POA: Diagnosis not present

## 2015-04-26 DIAGNOSIS — X58XXXS Exposure to other specified factors, sequela: Secondary | ICD-10-CM | POA: Diagnosis not present

## 2015-04-26 DIAGNOSIS — Y9389 Activity, other specified: Secondary | ICD-10-CM | POA: Insufficient documentation

## 2015-04-26 MED ORDER — HYDROCODONE-ACETAMINOPHEN 5-325 MG PO TABS
1.0000 | ORAL_TABLET | Freq: Once | ORAL | Status: AC
  Administered 2015-04-26: 1 via ORAL
  Filled 2015-04-26: qty 1

## 2015-04-26 MED ORDER — HYDROCODONE-ACETAMINOPHEN 5-325 MG PO TABS: 1.0000 | ORAL_TABLET | Freq: Once | ORAL | Status: DC

## 2015-04-26 NOTE — ED Notes (Signed)
PT mechanical fall with walker today. Hit back left side of head, pt is on blood thinners. Pain to head only. Ambulatory since the fall, but unsteady. Pt also mentions that she has a fractured pelvis X5 weeks-from a fall. Pt denies LOC.

## 2015-04-26 NOTE — Discharge Instructions (Signed)
Your head CT looked okay. Your x-ray of the pelvis was negative for new fracture. Continue light activity as tolerated. Follow-up with orthopedics as needed for the pelvic fractures. Return to the emergency department if you have uncontrolled pain or other urgent concerns.  Contusion A contusion is a deep bruise. Contusions happen when an injury causes bleeding under the skin. Symptoms of bruising include pain, swelling, and discolored skin. The skin may turn blue, purple, or yellow. HOME CARE   Rest the injured area.  If told, put ice on the injured area.  Put ice in a plastic bag.  Place a towel between your skin and the bag.  Leave the ice on for 20 minutes, 2-3 times per day.  If told, put light pressure (compression) on the injured area using an elastic bandage. Make sure the bandage is not too tight. Remove it and put it back on as told by your doctor.  If possible, raise (elevate) the injured area above the level of your heart while you are sitting or lying down.  Take over-the-counter and prescription medicines only as told by your doctor. GET HELP IF:  Your symptoms do not get better after several days of treatment.  Your symptoms get worse.  You have trouble moving the injured area. GET HELP RIGHT AWAY IF:   You have very bad pain.  You have a loss of feeling (numbness) in a hand or foot.  Your hand or foot turns pale or cold.   This information is not intended to replace advice given to you by your health care provider. Make sure you discuss any questions you have with your health care provider.   Document Released: 11/22/2007 Document Revised: 02/24/2015 Document Reviewed: 10/21/2014 Elsevier Interactive Patient Education [employer].

## 2015-04-26 NOTE — ED Provider Notes (Signed)
[facility] Emergency Department Provider Note  ____________________________________________  Time seen: 2020 p.m.  I have reviewed the triage vital signs and the nursing notes.   HISTORY  Chief Complaint Fall     HPI Brandi [name] is a 79 y.o. female who sustained a pelvic fracture on the right approximate 5 weeks ago.  Today, she attempted to sit down on her walker, but it rolled out from underneath her. She landed on her buttocks and rolled backwards hitting the back of her head. This occurred at approximately 2 PM today.  She reports ongoing discomfort in the right pelvis. She denies any significant head pain or neck pain. She is very pleasant and interactive and laughs through much of the interview. No acute distress.  She denies loss of consciousness. She does take Plavix and aspirin and meloxicam.    Past Medical History  Diagnosis Date  . Hypertension   . Diabetes mellitus without complication ([place])     There are no active problems to display for this patient.   Past Surgical History  Procedure Laterality Date  . Breast biopsy Right 2004    neg  . Cardiac surgery      Stent  . Cholecystectomy    . Joint replacement      TKR Bilateral  . Septoplasty    . Bunionectomy      Current Outpatient Rx  Name  Route  Sig  Dispense  Refill  . aspirin EC 81 MG tablet   Oral   Take 81 mg by mouth daily.         . budesonide-formoterol (SYMBICORT) 80-4.5 MCG/ACT inhaler   Inhalation   Inhale 2 puffs into the lungs 2 (two) times daily.         . Calcium Polycarbophil (FIBER-LAX PO)   Oral   Take 1 tablet by mouth daily.         . clopidogrel (PLAVIX) 75 MG tablet   Oral   Take 75 mg by mouth daily.         Marland Kitchen CRANBERRY PO   Oral   Take 1 capsule by mouth daily.         . Cyanocobalamin (VITAMIN B-12 PO)   Oral   Take 1 tablet by mouth daily.         . meloxicam (MOBIC) 15 MG tablet   Oral   Take 15 mg by mouth  daily.         . Multiple Vitamins-Minerals (OCUVITE PRESERVISION PO)   Oral   Take 1 capsule by mouth 2 (two) times daily.         . pantoprazole (PROTONIX) 40 MG tablet   Oral   Take 40 mg by mouth daily.         . potassium chloride SA (K-DUR,KLOR-CON) 20 MEQ tablet   Oral   Take 20 mEq by mouth 2 (two) times daily.         . pregabalin (LYRICA) 75 MG capsule   Oral   Take 75 mg by mouth 2 (two) times daily.         . simvastatin (ZOCOR) 40 MG tablet   Oral   Take 40 mg by mouth daily at 6 PM.         . SitaGLIPtin-MetFORMIN HCl (JANUMET XR) 50-1000 MG TB24   Oral   Take 1 tablet by mouth 2 (two) times daily.         Marland Kitchen sulfamethoxazole-trimethoprim (BACTRIM DS) 800-160  MG per tablet   Oral   Take 1 tablet by mouth 2 (two) times daily.   20 tablet   0   . valsartan (DIOVAN) 160 MG tablet   Oral   Take 160 mg by mouth daily.         . Verapamil HCl CR 300 MG CP24   Oral   Take 1 capsule by mouth daily.           Allergies Micardis and Accupril  Family History  Problem Relation Age of Onset  . Breast cancer Cousin     Social History Social History  Substance Use Topics  . Smoking status: Never Smoker   . Smokeless tobacco: Never Used  . Alcohol Use: No    Review of Systems  Constitutional: Negative for fatigue. ENT: Negative for congestion. Cardiovascular: Negative for chest pain. Respiratory: Negative for cough. Gastrointestinal: Negative for abdominal pain, vomiting and diarrhea. Genitourinary: Negative for dysuria. Musculoskeletal: Recent pelvic fracture with her new fall today.. Skin: Negative for rash. Neurological: Negative for headache or focal weakness   10-point ROS otherwise negative.  ____________________________________________   PHYSICAL EXAM:  VITAL SIGNS: ED Triage Vitals  Enc Vitals Group     BP 04/26/15 1606 151/90 mmHg     Pulse Rate 04/26/15 1606 96     Resp 04/26/15 1606 18     Temp 04/26/15 1606  98.1 F (36.7 C)     Temp Source 04/26/15 1606 Oral     SpO2 04/26/15 1606 95 %     Weight 04/26/15 1606 200 lb (90.719 kg)     Height 04/26/15 1606 5\' 1"  (1.549 m)     Head Cir --      Peak Flow --      Pain Score 04/26/15 1607 5     Pain Loc --      Pain Edu? --      Excl. in [place]? --     Constitutional:  Alert and oriented. Well appearing and in no distress. Very personable. Laughing quite a bit during the examination, including with discomfort. ENT   Head: Normocephalic with a minor hematoma in the posterior area..   Nose: No congestion/rhinnorhea.       Mouth: No erythema, no swelling   Cervical: Nontender with normal spontaneous movement. Cardiovascular: Normal rate, regular rhythm, no murmur noted Respiratory:  Normal respiratory effort, no tachypnea.    Breath sounds are clear and equal bilaterally.  Gastrointestinal: Soft and nontender. No distention.  Back: No muscle spasm, no tenderness, no CVA tenderness. Musculoskeletal: No deformity noted. Normal range of motion in all extremities, but with discomfort in the right pelvis as she flexes the right knee..  No noted edema. Neurologic:  Communicative. Normal appearing spontaneous movement in all 4 extremities. No gross focal neurologic deficits are appreciated.  Skin:  Skin is warm, dry. No rash noted. Psychiatric: Mood and affect are normal. Speech and behavior are normal.  ____________________________________________ ____________________________________________    RADIOLOGY  CT head: IMPRESSION: No acute abnormality.  Mild atrophy and chronic microvascular ischemic change   Pelvic x-ray:  IMPRESSION: Stable right pubic rami fractures. No new/acute pelvic or hip fractures.  ____________________________________________  INITIAL IMPRESSION / ASSESSMENT AND PLAN / ED COURSE  Pertinent labs & imaging results that were available during my care of the patient were reviewed by me and considered in my medical  decision making (see chart for details).   Very pleasant 79 year old female in no acute distress. She has some  tenderness in the right pelvis, but no crepitus and no significant reduction in mobility. She laughs a moderate amount through this part of the exam.  Head CT is negative. X-ray of the pelvis is pending.  ----------------------------------------- 9:43 PM on 04/26/2015 -----------------------------------------  X-ray of the pelvis shows stable right pubic gray my fractures without any acute changes.  We've treated the patient's pain with a Vicodin. She feels little bit better. She is in no acute distress. We will discharge her home to continue recovery for her prior pelvic fractures.  ____________________________________________   FINAL CLINICAL IMPRESSION(S) / ED DIAGNOSES  Final diagnoses:  Fall, initial encounter  Head contusion, initial encounter  Pelvic fracture, sequela      Ahmed Prima, MD 04/26/15 2146

## 2015-06-30 ENCOUNTER — Other Ambulatory Visit: Payer: Self-pay | Admitting: Internal Medicine

## 2015-06-30 ENCOUNTER — Other Ambulatory Visit: Payer: Self-pay

## 2015-06-30 DIAGNOSIS — Z1231 Encounter for screening mammogram for malignant neoplasm of breast: Secondary | ICD-10-CM

## 2015-09-07 ENCOUNTER — Encounter: Payer: Self-pay | Admitting: *Deleted

## 2015-09-08 ENCOUNTER — Ambulatory Visit: Payer: Self-pay | Admitting: Urology

## 2015-09-09 ENCOUNTER — Ambulatory Visit (INDEPENDENT_AMBULATORY_CARE_PROVIDER_SITE_OTHER): Payer: Medicare Other | Admitting: Urology

## 2015-09-09 ENCOUNTER — Encounter: Payer: Self-pay | Admitting: Urology

## 2015-09-09 VITALS — BP 191/74 | HR 83 | Ht 61.0 in | Wt 205.9 lb

## 2015-09-09 DIAGNOSIS — R32 Unspecified urinary incontinence: Secondary | ICD-10-CM

## 2015-09-09 DIAGNOSIS — N390 Urinary tract infection, site not specified: Secondary | ICD-10-CM

## 2015-09-09 DIAGNOSIS — IMO0001 Reserved for inherently not codable concepts without codable children: Secondary | ICD-10-CM

## 2015-09-09 DIAGNOSIS — N8110 Cystocele, unspecified: Secondary | ICD-10-CM

## 2015-09-09 DIAGNOSIS — IMO0002 Reserved for concepts with insufficient information to code with codable children: Secondary | ICD-10-CM

## 2015-09-09 LAB — MICROSCOPIC EXAMINATION

## 2015-09-09 LAB — BLADDER SCAN AMB NON-IMAGING: SCAN RESULT: 0

## 2015-09-09 LAB — URINALYSIS, COMPLETE
Bilirubin, UA: NEGATIVE
GLUCOSE, UA: NEGATIVE
Ketones, UA: NEGATIVE
LEUKOCYTES UA: NEGATIVE
Nitrite, UA: NEGATIVE
Protein, UA: NEGATIVE
RBC, UA: NEGATIVE
Specific Gravity, UA: 1.01 (ref 1.005–1.030)
UUROB: 0.2 mg/dL (ref 0.2–1.0)
pH, UA: 6.5 (ref 5.0–7.5)

## 2015-09-09 MED ORDER — MIRABEGRON ER 25 MG PO TB24: 25.0000 mg | ORAL_TABLET | Freq: Every day | ORAL | Status: DC

## 2015-09-09 NOTE — Progress Notes (Signed)
09/09/2015 2:27 PM   [name] 1935/11/16 [id number]  Referring provider: Madelyn Brunner, MD [facility] [place], [place] [address]  Chief Complaint  Patient presents with  . Recurrent UTI    1 year recheck    HPI: Patient is 80 year old Caucasian female who has a history of recurrent urinary tract infections and incontinence that is controlled Myrbetriq 25 mg daily who presents today for a 1 year follow-up.  Patient has had 3 urinary tract infections over the past year.  One for Escherichia coli and 2 for Enterobacter.  They're treated with the appropriate antibiotics.  She is not complaining of urinary tract infection at today's visit.  She denies gross hematuria, dysuria and suprapubic pain.  She has not had any recent fevers, chills, nausea or vomiting.  Her UA today is unremarkable.  She is still continuing the Myrbetriq 25 mg daily with good control of her incontinence.  Her PVR on today's exam is 0 mL.  She does get up once a night to urinate.     PMH: Past Medical History  Diagnosis Date  . Hypertension   . Diabetes mellitus without complication ([place])   . Incontinence in female   . Chronic cystitis   . Anemia   . Arthritis, degenerative   . HTN (hypertension)   . Depression   . GERD (gastroesophageal reflux disease)   . Heart disease   . Anxiety   . Murmur   . HLD (hyperlipidemia)   . Sleep apnea   . Urge incontinence of urine   . Obesity   . Headache     Surgical History: Past Surgical History  Procedure Laterality Date  . Breast biopsy Right 2004    neg  . Cardiac surgery      Stent  . Cholecystectomy    . Joint replacement      TKR Bilateral  . Septoplasty    . Bunionectomy      Home Medications:    Medication List       This list is accurate as of: 09/09/15  2:27 PM.  Always use your most recent med list.               aspirin EC 81 MG tablet  Take 81 mg by mouth daily.     budesonide-formoterol 80-4.5 MCG/ACT inhaler  Commonly known as:  SYMBICORT  Inhale 2 puffs into the lungs 2 (two) times daily.     Butalbital-APAP-Caffeine 50-325-40 MG capsule  Take by mouth.     clopidogrel 75 MG tablet  Commonly known as:  PLAVIX  Take 75 mg by mouth daily.     CRANBERRY PO  Take 1 capsule by mouth daily.     FIBER-LAX PO  Take 1 tablet by mouth daily.     glipiZIDE 5 MG tablet  Commonly known as:  GLUCOTROL  5 mg 2 (two) times daily.     HYDROcodone-acetaminophen 5-325 MG tablet  Commonly known as:  NORCO/VICODIN  Take 1 tablet by mouth once.     JANUMET XR 50-1000 MG Tb24  Generic drug:  SitaGLIPtin-MetFORMIN HCl  Take 1 tablet by mouth 2 (two) times daily.     LANTUS SOLOSTAR 100 UNIT/ML Solostar Pen  Generic drug:  Insulin Glargine  Inject into the skin.     meloxicam 15 MG tablet  Commonly known as:  MOBIC  Take 15 mg by mouth daily.     metFORMIN 500 MG 24  hr tablet  Commonly known as:  GLUCOPHAGE-XR  Take 500 mg by mouth. Reported on 09/09/2015     mirabegron ER 25 MG Tb24 tablet  Commonly known as:  MYRBETRIQ  Take 1 tablet (25 mg total) by mouth daily.     OCUVITE PRESERVISION PO  Take 1 capsule by mouth 2 (two) times daily. Reported on 09/09/2015     pantoprazole 40 MG tablet  Commonly known as:  PROTONIX  Take 40 mg by mouth daily.     potassium chloride SA 20 MEQ tablet  Commonly known as:  K-DUR,KLOR-CON  Take 20 mEq by mouth 2 (two) times daily.     pregabalin 75 MG capsule  Commonly known as:  LYRICA  Take 75 mg by mouth 2 (two) times daily.     simvastatin 40 MG tablet  Commonly known as:  ZOCOR  Take 40 mg by mouth daily at 6 PM.     sulfamethoxazole-trimethoprim 800-160 MG tablet  Commonly known as:  BACTRIM DS  Take 1 tablet by mouth 2 (two) times daily.     valsartan 160 MG tablet  Commonly known as:  DIOVAN  Take 160 mg by mouth daily. Reported on 09/09/2015     Verapamil HCl CR 300 MG Cp24  Take 1 capsule  by mouth daily.     VIIBRYD 20 MG Tabs  Generic drug:  Vilazodone HCl  Take 40 mg by mouth.     VITAMIN B-12 PO  Take 1 tablet by mouth daily.        Allergies:  Allergies  Allergen Reactions  . Micardis [Telmisartan] Other (See Comments)    Dizziness  . Accupril [Quinapril Hcl] Rash  . Quinapril Rash    Family History: Family History  Problem Relation Age of Onset  . Breast cancer Cousin   . Skin cancer    . Diabetes    . Hypercholesterolemia    . Hypertension    . Kidney disease Neg Hx   . Bladder Cancer Maternal Aunt   . COPD      Social History:  reports that she has never smoked. She has never used smokeless tobacco. She reports that she does not drink alcohol or use illicit drugs.  ROS: UROLOGY Frequent Urination?: No Hard to postpone urination?: No Burning/pain with urination?: No Get up at night to urinate?: Yes Leakage of urine?: No Urine stream starts and stops?: No Trouble starting stream?: No Do you have to strain to urinate?: No Blood in urine?: No Urinary tract infection?: No Sexually transmitted disease?: No Injury to kidneys or bladder?: No Painful intercourse?: No Weak stream?: No Currently pregnant?: No Vaginal bleeding?: No Last menstrual period?: n  Gastrointestinal Nausea?: No Vomiting?: No Indigestion/heartburn?: No Diarrhea?: No Constipation?: No  Constitutional Fever: No Night sweats?: No Weight loss?: No Fatigue?: No  Skin Skin rash/lesions?: Yes Itching?: No  Eyes Blurred vision?: No Double vision?: No  Ears/Nose/Throat Sore throat?: No Sinus problems?: No  Hematologic/Lymphatic Swollen glands?: No Easy bruising?: No  Cardiovascular Leg swelling?: No Chest pain?: No  Respiratory Cough?: No Shortness of breath?: No  Endocrine Excessive thirst?: No  Musculoskeletal Back pain?: No Joint pain?: Yes  Neurological Headaches?: Yes Dizziness?: No  Psychologic Depression?: Yes Anxiety?:  No  Physical Exam: BP 191/74 mmHg  Pulse 83  Ht 5\' 1"  (1.549 m)  Wt 205 lb 14.4 oz (93.396 kg)  BMI 38.92 kg/m2  Constitutional: Well nourished. Alert and oriented, No acute distress. HEENT: [place] AT, moist mucus membranes.  Trachea midline, no masses. Cardiovascular: No clubbing, cyanosis, or edema. Respiratory: Normal respiratory effort, no increased work of breathing. GI: Abdomen is soft, non tender, non distended, no abdominal masses. Liver and spleen not palpable.  No hernias appreciated.  Stool sample for occult testing is not indicated.   GU: No CVA tenderness.  No bladder fullness or masses.  Atrophic external genitalia, normal pubic hair distribution, no lesions.  Normal urethral meatus, no lesions, no prolapse, no discharge.   Urethral caruncle.   No bladder fullness, tenderness or masses. Normal vagina mucosa, good estrogen effect, no discharge, no lesions, poor pelvic support, Grade III cystocele is noted.  Rectocele is  noted.  No cervical motion tenderness.  Uterus is freely mobile and non-fixed.  No adnexal/parametria masses or tenderness noted.  Anus and perineum are without rashes or lesions.    Skin: No rashes, bruises or suspicious lesions. Lymph: No cervical or inguinal adenopathy. Neurologic: Grossly intact, no focal deficits, moving all 4 extremities. Psychiatric: Normal mood and affect.  Laboratory Data: Lab Results  Component Value Date   WBC 9.2 01/16/2015   HGB 12.5 01/16/2015   HCT 38.4 01/16/2015   MCV 74.0* 01/16/2015   PLT 190 01/16/2015   Lab Results  Component Value Date   CREATININE 0.97 01/16/2015   Lab Results  Component Value Date   HGBA1C 7.7* 06/30/2011      Component Value Date/Time   CHOL 141 06/30/2011 0551   HDL 63* 06/30/2011 0551   VLDL 14 06/30/2011 0551   LDLCALC 64 06/30/2011 0551   Lab Results  Component Value Date   AST 16 06/29/2011   Lab Results  Component Value Date   ALT 20 06/29/2011     Urinalysis Microscopic  Examination  Result Value Ref Range   WBC, UA 0-5 0 -  5 /hpf   RBC, UA 0-2 0 -  2 /hpf   Epithelial Cells (non renal) 0-10 0 - 10 /hpf   Mucus, UA Present (A) Not Estab.   Bacteria, UA Few (A) None seen/Few  Urinalysis, Complete  Result Value Ref Range   Specific Gravity, UA 1.010 1.005 - 1.030   pH, UA 6.5 5.0 - 7.5   Color, UA Yellow Yellow   Appearance Ur Clear Clear   Leukocytes, UA Negative Negative   Protein, UA Negative Negative/Trace   Glucose, UA Negative Negative   Ketones, UA Negative Negative   RBC, UA Negative Negative   Bilirubin, UA Negative Negative   Urobilinogen, Ur 0.2 0.2 - 1.0 mg/dL   Nitrite, UA Negative Negative   Microscopic Examination See below:    Pertinent Imaging: Results for MAGALINE, KOSIOREK (MRN [id number]) as of 09/09/2015 14:24  Ref. Range 09/09/2015 14:09  Scan Result Unknown 0     Assessment & Plan:    1. Recurrent UTI:   Patient's UA was unremarkable, but she would like a culture sent to make sure her infection has cleared from January.  She did not find the vaginal estrogen cream helpful.  She will contact us if she should experience symptoms of an UTI.  - Urinalysis, Complete - CULTURE, URINE COMPREHENSIVE  2. Incontinence:   Patient's incontinence is well controlled on Myrbetriq 25 mg daily. She will continue that medication.  Refills are sent to her pharmacy.  She will follow up in one year for a PVR and UA.    - BLADDER SCAN AMB NON-IMAGING  3. Cystocele:   Manage conservatively at this time.  We will reassess  when she returns in one year.     Return in about 1 year (around 09/08/2016) for PVR and UA.  These notes generated with voice recognition software. I apologize for typographical errors.  Zara Council, [facility] [address] [place], [place] [address] [phone]

## 2015-09-11 LAB — CULTURE, URINE COMPREHENSIVE

## 2015-09-13 DIAGNOSIS — R32 Unspecified urinary incontinence: Secondary | ICD-10-CM | POA: Insufficient documentation

## 2015-09-13 DIAGNOSIS — IMO0002 Reserved for concepts with insufficient information to code with codable children: Secondary | ICD-10-CM

## 2015-09-13 DIAGNOSIS — N390 Urinary tract infection, site not specified: Secondary | ICD-10-CM | POA: Insufficient documentation

## 2015-09-13 DIAGNOSIS — IMO0001 Reserved for inherently not codable concepts without codable children: Secondary | ICD-10-CM | POA: Insufficient documentation

## 2015-10-01 ENCOUNTER — Encounter: Payer: Self-pay | Admitting: Emergency Medicine

## 2015-10-01 ENCOUNTER — Emergency Department: Payer: Medicare Other

## 2015-10-01 ENCOUNTER — Emergency Department
Admission: EM | Admit: 2015-10-01 | Discharge: 2015-10-01 | Disposition: A | Discharge status: Home / Self Care | Payer: Medicare Other | Attending: Emergency Medicine | Admitting: Emergency Medicine

## 2015-10-01 DIAGNOSIS — E669 Obesity, unspecified: Secondary | ICD-10-CM | POA: Diagnosis not present

## 2015-10-01 DIAGNOSIS — E119 Type 2 diabetes mellitus without complications: Secondary | ICD-10-CM | POA: Diagnosis not present

## 2015-10-01 DIAGNOSIS — M1990 Unspecified osteoarthritis, unspecified site: Secondary | ICD-10-CM | POA: Diagnosis not present

## 2015-10-01 DIAGNOSIS — F329 Major depressive disorder, single episode, unspecified: Secondary | ICD-10-CM | POA: Insufficient documentation

## 2015-10-01 DIAGNOSIS — R42 Dizziness and giddiness: Secondary | ICD-10-CM | POA: Diagnosis present

## 2015-10-01 DIAGNOSIS — Z7982 Long term (current) use of aspirin: Secondary | ICD-10-CM | POA: Insufficient documentation

## 2015-10-01 DIAGNOSIS — Z7984 Long term (current) use of oral hypoglycemic drugs: Secondary | ICD-10-CM | POA: Insufficient documentation

## 2015-10-01 DIAGNOSIS — I119 Hypertensive heart disease without heart failure: Secondary | ICD-10-CM | POA: Insufficient documentation

## 2015-10-01 DIAGNOSIS — Z79899 Other long term (current) drug therapy: Secondary | ICD-10-CM | POA: Diagnosis not present

## 2015-10-01 DIAGNOSIS — E785 Hyperlipidemia, unspecified: Secondary | ICD-10-CM | POA: Diagnosis not present

## 2015-10-01 DIAGNOSIS — J0120 Acute ethmoidal sinusitis, unspecified: Secondary | ICD-10-CM | POA: Insufficient documentation

## 2015-10-01 DIAGNOSIS — N390 Urinary tract infection, site not specified: Secondary | ICD-10-CM | POA: Insufficient documentation

## 2015-10-01 DIAGNOSIS — Z794 Long term (current) use of insulin: Secondary | ICD-10-CM | POA: Insufficient documentation

## 2015-10-01 LAB — URINALYSIS COMPLETE WITH MICROSCOPIC (ARMC ONLY)
BILIRUBIN URINE: NEGATIVE
Bacteria, UA: NONE SEEN
GLUCOSE, UA: NEGATIVE mg/dL
Hgb urine dipstick: NEGATIVE
NITRITE: NEGATIVE
Protein, ur: 30 mg/dL — AB
Specific Gravity, Urine: 1.017 (ref 1.005–1.030)
pH: 5 (ref 5.0–8.0)

## 2015-10-01 LAB — BASIC METABOLIC PANEL
ANION GAP: 10 (ref 5–15)
BUN: 19 mg/dL (ref 6–20)
CALCIUM: 8.9 mg/dL (ref 8.9–10.3)
CO2: 23 mmol/L (ref 22–32)
Chloride: 97 mmol/L — ABNORMAL LOW (ref 101–111)
Creatinine, Ser: 1.42 mg/dL — ABNORMAL HIGH (ref 0.44–1.00)
GFR calc Af Amer: 40 mL/min — ABNORMAL LOW (ref >60)
GFR, EST NON AFRICAN AMERICAN: 35 mL/min — AB (ref >60)
GLUCOSE: 275 mg/dL — AB (ref 65–99)
Potassium: 4.1 mmol/L (ref 3.5–5.1)
Sodium: 130 mmol/L — ABNORMAL LOW (ref 135–145)

## 2015-10-01 LAB — CBC
HCT: 38.7 % (ref 35.0–47.0)
HEMOGLOBIN: 12.6 g/dL (ref 12.0–16.0)
MCH: 24.6 pg — ABNORMAL LOW (ref 26.0–34.0)
MCHC: 32.5 g/dL (ref 32.0–36.0)
MCV: 75.7 fL — ABNORMAL LOW (ref 80.0–100.0)
Platelets: 228 10*3/uL (ref 150–440)
RBC: 5.12 MIL/uL (ref 3.80–5.20)
RDW: 16.4 % — AB (ref 11.5–14.5)
WBC: 9.5 10*3/uL (ref 3.6–11.0)

## 2015-10-01 MED ORDER — SODIUM CHLORIDE 0.9 % IV BOLUS (SEPSIS)
1000.0000 mL | Freq: Once | INTRAVENOUS | Status: AC
  Administered 2015-10-01: 1000 mL via INTRAVENOUS

## 2015-10-01 MED ORDER — AMOXICILLIN-POT CLAVULANATE 875-125 MG PO TABS: 1.0000 | ORAL_TABLET | Freq: Two times a day (BID) | ORAL | Status: DC

## 2015-10-01 MED ORDER — DEXTROSE 5 % IV SOLN
1.0000 g | Freq: Once | INTRAVENOUS | Status: AC
  Administered 2015-10-01: 1 g via INTRAVENOUS
  Filled 2015-10-01: qty 10

## 2015-10-01 NOTE — ED Notes (Signed)
Patient arrives via [facility] from home with complaint of Syncope. Patient has been seen by PCP for same with a referral to Neurology scheduled in May. Patient states she was seen by Brigitte Pulse MD (Neuro) and sent home with "exercises like im watching a tennis match". + left sided [place] upon arrival. Patient was at home cooking eggs when she had an acute episode of "not feeling well", Patient syncopized upon sitting, witnessed by son. 4mg  Zofran given enroute, CBG 259 via EMS. Pt also c/o of headache for 6 weeks

## 2015-10-01 NOTE — Discharge Instructions (Signed)
Dizziness Dizziness is a common problem. It is a feeling of unsteadiness or light-headedness. You may feel like you are about to faint. Dizziness can lead to injury if you stumble or fall. Anyone can become dizzy, but dizziness is more common in older adults. This condition can be caused by a number of things, including medicines, dehydration, or illness. HOME CARE INSTRUCTIONS Taking these steps may help with your condition: Eating and Drinking  Drink enough fluid to keep your urine clear or pale yellow. This helps to keep you from becoming dehydrated. Try to drink more clear fluids, such as water.  Do not drink alcohol.  Limit your caffeine intake if directed by your health care provider.  Limit your salt intake if directed by your health care provider. Activity  Avoid making quick movements.  Rise slowly from chairs and steady yourself until you feel okay.  In the morning, first sit up on the side of the bed. When you feel okay, stand slowly while you hold onto something until you know that your balance is fine.  Move your legs often if you need to stand in one place for a long time. Tighten and relax your muscles in your legs while you are standing.  Do not drive or operate heavy machinery if you feel dizzy.  Avoid bending down if you feel dizzy. Place items in your home so that they are easy for you to reach without leaning over. Lifestyle  Do not use any tobacco products, including cigarettes, chewing tobacco, or electronic cigarettes. If you need help quitting, ask your health care provider.  Try to reduce your stress level, such as with yoga or meditation. Talk with your health care provider if you need help. General Instructions  Watch your dizziness for any changes.  Take medicines only as directed by your health care provider. Talk with your health care provider if you think that your dizziness is caused by a medicine that you are taking.  Tell a friend or a family  member that you are feeling dizzy. If he or she notices any changes in your behavior, have this person call your health care provider.  Keep all follow-up visits as directed by your health care provider. This is important. SEEK MEDICAL CARE IF:  Your dizziness does not go away.  Your dizziness or light-headedness gets worse.  You feel nauseous.  You have reduced hearing.  You have new symptoms.  You are unsteady on your feet or you feel like the room is spinning. SEEK IMMEDIATE MEDICAL CARE IF:  You vomit or have diarrhea and are unable to eat or drink anything.  You have problems talking, walking, swallowing, or using your arms, hands, or legs.  You feel generally weak.  You are not thinking clearly or you have trouble forming sentences. It may take a friend or family member to notice this.  You have chest pain, abdominal pain, shortness of breath, or sweating.  Your vision changes.  You notice any bleeding.  You have a headache.  You have neck pain or a stiff neck.  You have a fever.   This information is not intended to replace advice given to you by your health care provider. Make sure you discuss any questions you have with your health care provider.   Document Released: 11/29/2000 Document Revised: 10/20/2014 Document Reviewed: 06/01/2014 Elsevier Interactive Patient Education 2016 [employer]  Sinusitis, Adult Sinusitis is redness, soreness, and inflammation of the paranasal sinuses. Paranasal sinuses are air pockets within  the bones of your face. They are located beneath your eyes, in the middle of your forehead, and above your eyes. In healthy paranasal sinuses, mucus is able to drain out, and air is able to circulate through them by way of your nose. However, when your paranasal sinuses are inflamed, mucus and air can become trapped. This can allow bacteria and other germs to grow and cause infection. Sinusitis can develop quickly and last only a short time  (acute) or continue over a long period (chronic). Sinusitis that lasts for more than 12 weeks is considered chronic. CAUSES Causes of sinusitis include:  Allergies.  Structural abnormalities, such as displacement of the cartilage that separates your nostrils (deviated septum), which can decrease the air flow through your nose and sinuses and affect sinus drainage.  Functional abnormalities, such as when the small hairs (cilia) that line your sinuses and help remove mucus do not work properly or are not present. SIGNS AND SYMPTOMS Symptoms of acute and chronic sinusitis are the same. The primary symptoms are pain and pressure around the affected sinuses. Other symptoms include:  Upper toothache.  Earache.  Headache.  Bad breath.  Decreased sense of smell and taste.  A cough, which worsens when you are lying flat.  Fatigue.  Fever.  Thick drainage from your nose, which often is green and may contain pus (purulent).  Swelling and warmth over the affected sinuses. DIAGNOSIS Your health care provider will perform a physical exam. During your exam, your health care provider may perform any of the following to help determine if you have acute sinusitis or chronic sinusitis:  Look in your nose for signs of abnormal growths in your nostrils (nasal polyps).  Tap over the affected sinus to check for signs of infection.  View the inside of your sinuses using an imaging device that has a light attached (endoscope). If your health care provider suspects that you have chronic sinusitis, one or more of the following tests may be recommended:  Allergy tests.  Nasal culture. A sample of mucus is taken from your nose, sent to a lab, and screened for bacteria.  Nasal cytology. A sample of mucus is taken from your nose and examined by your health care provider to determine if your sinusitis is related to an allergy. TREATMENT Most cases of acute sinusitis are related to a viral infection  and will resolve on their own within 10 days. Sometimes, medicines are prescribed to help relieve symptoms of both acute and chronic sinusitis. These may include pain medicines, decongestants, nasal steroid sprays, or saline sprays. However, for sinusitis related to a bacterial infection, your health care provider will prescribe antibiotic medicines. These are medicines that will help kill the bacteria causing the infection. Rarely, sinusitis is caused by a fungal infection. In these cases, your health care provider will prescribe antifungal medicine. For some cases of chronic sinusitis, surgery is needed. Generally, these are cases in which sinusitis recurs more than 3 times per year, despite other treatments. HOME CARE INSTRUCTIONS  Drink plenty of water. Water helps thin the mucus so your sinuses can drain more easily.  Use a humidifier.  Inhale steam 3-4 times a day (for example, sit in the bathroom with the shower running).  Apply a warm, moist washcloth to your face 3-4 times a day, or as directed by your health care provider.  Use saline nasal sprays to help moisten and clean your sinuses.  Take medicines only as directed by your health care provider.  If you were prescribed either an antibiotic or antifungal medicine, finish it all even if you start to feel better. SEEK IMMEDIATE MEDICAL CARE IF:  You have increasing pain or severe headaches.  You have nausea, vomiting, or drowsiness.  You have swelling around your face.  You have vision problems.  You have a stiff neck.  You have difficulty breathing.   This information is not intended to replace advice given to you by your health care provider. Make sure you discuss any questions you have with your health care provider.   Document Released: 06/05/2005 Document Revised: 06/26/2014 Document Reviewed: 06/20/2011 Elsevier Interactive Patient Education 2016 Elsevier Inc.  Urinary Tract Infection Urinary tract infections  (UTIs) can develop anywhere along your urinary tract. Your urinary tract is your body's drainage system for removing wastes and extra water. Your urinary tract includes two kidneys, two ureters, a bladder, and a urethra. Your kidneys are a pair of bean-shaped organs. Each kidney is about the size of your fist. They are located below your ribs, one on each side of your spine. CAUSES Infections are caused by microbes, which are microscopic organisms, including fungi, viruses, and bacteria. These organisms are so small that they can only be seen through a microscope. Bacteria are the microbes that most commonly cause UTIs. SYMPTOMS  Symptoms of UTIs may vary by age and gender of the patient and by the location of the infection. Symptoms in young women typically include a frequent and intense urge to urinate and a painful, burning feeling in the bladder or urethra during urination. Older women and men are more likely to be tired, shaky, and weak and have muscle aches and abdominal pain. A fever may mean the infection is in your kidneys. Other symptoms of a kidney infection include pain in your back or sides below the ribs, nausea, and vomiting. DIAGNOSIS To diagnose a UTI, your caregiver will ask you about your symptoms. Your caregiver will also ask you to provide a urine sample. The urine sample will be tested for bacteria and white blood cells. White blood cells are made by your body to help fight infection. TREATMENT  Typically, UTIs can be treated with medication. Because most UTIs are caused by a bacterial infection, they usually can be treated with the use of antibiotics. The choice of antibiotic and length of treatment depend on your symptoms and the type of bacteria causing your infection. HOME CARE INSTRUCTIONS  If you were prescribed antibiotics, take them exactly as your caregiver instructs you. Finish the medication even if you feel better after you have only taken some of the medication.  Drink  enough water and fluids to keep your urine clear or pale yellow.  Avoid caffeine, tea, and carbonated beverages. They tend to irritate your bladder.  Empty your bladder often. Avoid holding urine for long periods of time.  Empty your bladder before and after sexual intercourse.  After a bowel movement, women should cleanse from front to back. Use each tissue only once. SEEK MEDICAL CARE IF:   You have back pain.  You develop a fever.  Your symptoms do not begin to resolve within 3 days. SEEK IMMEDIATE MEDICAL CARE IF:   You have severe back pain or lower abdominal pain.  You develop chills.  You have nausea or vomiting.  You have continued burning or discomfort with urination. MAKE SURE YOU:   Understand these instructions.  Will watch your condition.  Will get help right away if you are not doing  well or get worse.   This information is not intended to replace advice given to you by your health care provider. Make sure you discuss any questions you have with your health care provider.   Document Released: 03/15/2005 Document Revised: 02/24/2015 Document Reviewed: 07/14/2011 Elsevier Interactive Patient Education [employer].

## 2015-10-01 NOTE — ED Provider Notes (Signed)
[facility] Emergency Department Provider Note  ____________________________________________  Time seen: 6:50 PM  I have reviewed the triage vital signs and the nursing notes.   HISTORY  Chief Complaint Loss of Consciousness    HPI Brandi [name] is a 80 y.o. female brought to the ED after a fall today at home. Patient has had frequent episodes of getting dizzy and lightheaded. Today she got lightheaded and then passed out for a brief moment approximately just a few seconds. She has been treated for vertigo by her doctor, notes that her dizziness does get worse with the sensation of motion when she turns her head as well. Also complaining of pain in the bilateral frontal head but worse on the left. No vision changes numbness tingling or weakness. No vomiting. She also has frequent urinary tract infections.  No sudden or significant preceding pain such as chest pain abdominal pain back pain headache or neurologic symptoms. No acute symptoms after the syncope to suggest a vascular phenomenon.   Past Medical History  Diagnosis Date  . Hypertension   . Diabetes mellitus without complication ([place])   . Incontinence in female   . Chronic cystitis   . Anemia   . Arthritis, degenerative   . HTN (hypertension)   . Depression   . GERD (gastroesophageal reflux disease)   . Heart disease   . Anxiety   . Murmur   . HLD (hyperlipidemia)   . Sleep apnea   . Urge incontinence of urine   . Obesity   . Headache      Patient Active Problem List   Diagnosis Date Noted  . Recurrent UTI 09/13/2015  . Cystocele, grade 3 09/13/2015  . Incontinence 09/13/2015     Past Surgical History  Procedure Laterality Date  . Breast biopsy Right 2004    neg  . Cardiac surgery      Stent  . Cholecystectomy    . Joint replacement      TKR Bilateral  . Septoplasty    . Bunionectomy       Current Outpatient Rx  Name  Route  Sig  Dispense  Refill  .  amoxicillin-clavulanate (AUGMENTIN) 875-125 MG tablet   Oral   Take 1 tablet by mouth 2 (two) times daily.   20 tablet   0   . aspirin EC 81 MG tablet   Oral   Take 81 mg by mouth daily.         . budesonide-formoterol (SYMBICORT) 80-4.5 MCG/ACT inhaler   Inhalation   Inhale 2 puffs into the lungs 2 (two) times daily.         . Calcium Polycarbophil (FIBER-LAX PO)   Oral   Take 1 tablet by mouth daily.         . clopidogrel (PLAVIX) 75 MG tablet   Oral   Take 75 mg by mouth daily.         Marland Kitchen CRANBERRY PO   Oral   Take 1 capsule by mouth daily.         . Cyanocobalamin (VITAMIN B-12 PO)   Oral   Take 1 tablet by mouth daily.         Marland Kitchen glipiZIDE (GLUCOTROL) 5 MG tablet      5 mg 2 (two) times daily.          Marland Kitchen HYDROcodone-acetaminophen (NORCO/VICODIN) 5-325 MG tablet   Oral   Take 1 tablet by mouth once. Patient not taking: Reported on 09/09/2015  12 tablet   0   . Insulin Glargine (LANTUS SOLOSTAR) 100 UNIT/ML Solostar Pen   Subcutaneous   Inject into the skin.         . meloxicam (MOBIC) 15 MG tablet   Oral   Take 15 mg by mouth daily.         . metFORMIN (GLUCOPHAGE-XR) 500 MG 24 hr tablet   Oral   Take 500 mg by mouth. Reported on 09/09/2015         . mirabegron ER (MYRBETRIQ) 25 MG TB24 tablet   Oral   Take 1 tablet (25 mg total) by mouth daily.   90 tablet   3   . Multiple Vitamins-Minerals (OCUVITE PRESERVISION PO)   Oral   Take 1 capsule by mouth 2 (two) times daily. Reported on 09/09/2015         . pantoprazole (PROTONIX) 40 MG tablet   Oral   Take 40 mg by mouth daily.         . potassium chloride SA (K-DUR,KLOR-CON) 20 MEQ tablet   Oral   Take 20 mEq by mouth 2 (two) times daily.         . pregabalin (LYRICA) 75 MG capsule   Oral   Take 75 mg by mouth 2 (two) times daily.         . simvastatin (ZOCOR) 40 MG tablet   Oral   Take 40 mg by mouth daily at 6 PM.         . SitaGLIPtin-MetFORMIN HCl (JANUMET  XR) 50-1000 MG TB24   Oral   Take 1 tablet by mouth 2 (two) times daily.         Marland Kitchen sulfamethoxazole-trimethoprim (BACTRIM DS) 800-160 MG per tablet   Oral   Take 1 tablet by mouth 2 (two) times daily. Patient not taking: Reported on 09/09/2015   20 tablet   0   . valsartan (DIOVAN) 160 MG tablet   Oral   Take 160 mg by mouth daily. Reported on 09/09/2015         . Verapamil HCl CR 300 MG CP24   Oral   Take 1 capsule by mouth daily.         . Vilazodone HCl (VIIBRYD) 20 MG TABS   Oral   Take 40 mg by mouth.             Allergies Micardis; Accupril; and Quinapril   Family History  Problem Relation Age of Onset  . Breast cancer Cousin   . Skin cancer    . Diabetes    . Hypercholesterolemia    . Hypertension    . Kidney disease Neg Hx   . Bladder Cancer Maternal Aunt   . COPD      Social History Social History  Substance Use Topics  . Smoking status: Never Smoker   . Smokeless tobacco: Never Used  . Alcohol Use: No    Review of Systems  Constitutional:   No fever or chills.  Eyes:   No vision changes.  ENT:   No sore throat. No rhinorrhea. Cardiovascular:   No chest pain. Respiratory:   No dyspnea or cough. Gastrointestinal:   Negative for abdominal pain, vomiting and diarrhea.  No bloody stool. Genitourinary:   Negative for dysuria or difficulty urinating. Musculoskeletal:   Left lower back pain, right elbow bruising and pain. Neurological:   Positive headache dizziness and syncope. 10-point ROS otherwise negative.  ____________________________________________   PHYSICAL EXAM:  VITAL SIGNS:  ED Triage Vitals  Enc Vitals Group     BP 10/01/15 1837 150/69 mmHg     Pulse Rate 10/01/15 1837 86     Resp 10/01/15 1837 18     Temp 10/01/15 1837 98.5 F (36.9 C)     Temp Source 10/01/15 1837 Oral     SpO2 10/01/15 1837 99 %     Weight 10/01/15 1837 206 lb (93.441 kg)     Height 10/01/15 1837 5\' 1"  (1.549 m)     Head Cir --      Peak Flow --       Pain Score --      Pain Loc --      Pain Edu? --      Excl. in [place]? --     Vital signs reviewed, nursing assessments reviewed.   Constitutional:   Alert and oriented. Well appearing and in no distress. Eyes:   No scleral icterus. No conjunctival pallor. PERRL. EOMI. No nystagmus ENT   Head:   Normocephalic and atraumatic.TMs normal bilaterally   Nose:   No congestion/rhinnorhea. No septal hematoma   Mouth/Throat:   MMM, no pharyngeal erythema. No peritonsillar mass.    Neck:   No stridor. No SubQ emphysema. No meningismus. Hematological/Lymphatic/Immunilogical:   No cervical lymphadenopathy. Cardiovascular:   RRR. Symmetric bilateral radial and DP pulses.  No murmurs.  Respiratory:   Normal respiratory effort without tachypnea nor retractions. Breath sounds are clear and equal bilaterally. No wheezes/rales/rhonchi. Gastrointestinal:   Soft and nontender. Non distended. There is no CVA tenderness.  No rebound, rigidity, or guarding. Genitourinary:   deferred Musculoskeletal:   Right elbow extensor bruising and mild swelling consistent with contusion. No bony point tenderness. No midline spinal tenderness.. Neurologic:   Normal speech and language.  CN 2-10 normal. Motor grossly intact. No pronator drift. Motor intact in all large muscle groups in upper and lower extremities. HINTS exam unremarkable Dix-Hallpike positive to the left Negative jolt accentuation Normal finger to nose No gross focal neurologic deficits are appreciated.  Skin:    Skin is warm, dry and intact. No rash noted.  No petechiae, purpura, or bullae.  ____________________________________________    LABS (pertinent positives/negatives) (all labs ordered are listed, but only abnormal results are displayed) Labs Reviewed  BASIC METABOLIC PANEL - Abnormal; Notable for the following:    Sodium 130 (*)    Chloride 97 (*)    Glucose, Bld 275 (*)    Creatinine, Ser 1.42 (*)    GFR calc non Af  Amer 35 (*)    GFR calc Af Amer 40 (*)    All other components within normal limits  CBC - Abnormal; Notable for the following:    MCV 75.7 (*)    MCH 24.6 (*)    RDW 16.4 (*)    All other components within normal limits  URINALYSIS COMPLETEWITH MICROSCOPIC ([facility] ONLY) - Abnormal; Notable for the following:    Color, Urine YELLOW (*)    APPearance HAZY (*)    Ketones, ur TRACE (*)    Protein, ur 30 (*)    Leukocytes, UA 1+ (*)    Squamous Epithelial / LPF 0-5 (*)    All other components within normal limits  URINE CULTURE   ____________________________________________   EKG  Interpreted by me Right axis, normal intervals. Normal QRS ST segments. T wave inversions in the high lateral leads.  ____________________________________________    RADIOLOGY  CT head unremarkable except for acute left ethmoidal sinusitis  ____________________________________________   PROCEDURES   ____________________________________________   INITIAL IMPRESSION / ASSESSMENT AND PLAN / ED COURSE  Pertinent labs & imaging results that were available during my care of the patient were reviewed by me and considered in my medical decision making (see chart for details).  Patient presents with dizziness and syncope. Overall well-appearing no acute distress. Does have significant hypertension but low suspicion for intracranial hemorrhage or other vascular rupture or bleeding. No evidence of sepsis or ACS. No PE pneumothorax or dissection. She does have sinusitis and urinary tract infection based on labs. We'll start her on Augmentin which should treat both. She'll continue Dramamine at home that she is taking for the vertigo and follow-up with primary care. Labs do show she is likely mildly dehydrated, she was given IV fluids for this.     ____________________________________________   FINAL CLINICAL IMPRESSION(S) / ED DIAGNOSES  Final diagnoses:  Dizziness  Acute ethmoidal sinusitis,  recurrence not specified  Urinary tract infection without hematuria, site unspecified       Portions of this note were generated with dragon dictation software. Dictation errors may occur despite best attempts at proofreading.   Carrie Mew, MD 10/01/15 2129

## 2015-10-01 NOTE — ED Notes (Signed)
Pt has right elbow bruising from a fall Wednesday at church and a golf ball size bruise on her left lower back from where she fell into the wall to day before her LOC time.

## 2015-10-03 LAB — URINE CULTURE: Culture: 50000 — AB

## 2015-10-06 DIAGNOSIS — E1140 Type 2 diabetes mellitus with diabetic neuropathy, unspecified: Secondary | ICD-10-CM | POA: Insufficient documentation

## 2015-11-10 ENCOUNTER — Ambulatory Visit (INDEPENDENT_AMBULATORY_CARE_PROVIDER_SITE_OTHER): Payer: Medicare Other

## 2015-11-10 VITALS — BP 177/77 | HR 94 | Temp 97.3°F | Wt 206.1 lb

## 2015-11-10 DIAGNOSIS — I951 Orthostatic hypotension: Secondary | ICD-10-CM | POA: Insufficient documentation

## 2015-11-10 DIAGNOSIS — N390 Urinary tract infection, site not specified: Secondary | ICD-10-CM

## 2015-11-10 NOTE — Progress Notes (Signed)
Pt called stating she thinks she has a UTI. VS-177/77, 94, 97.3, 206.1lb. Pt described UTI symptoms to be increased frequency, leakage, dysuria, and lower abd pain. Pt denied n/v, f/c. Urine was sent for u/a and cx.

## 2015-11-10 NOTE — Addendum Note (Signed)
Addended by: Lestine Box on: 11/10/2015 04:15 PM   Modules accepted: Orders

## 2015-11-11 LAB — URINALYSIS, COMPLETE
BILIRUBIN UA: NEGATIVE
Glucose, UA: NEGATIVE
Ketones, UA: NEGATIVE
Nitrite, UA: NEGATIVE
PH UA: 5.5 (ref 5.0–7.5)
PROTEIN UA: NEGATIVE
Specific Gravity, UA: 1.015 (ref 1.005–1.030)
UUROB: 0.2 mg/dL (ref 0.2–1.0)

## 2015-11-11 LAB — MICROSCOPIC EXAMINATION
BACTERIA UA: NONE SEEN
RBC, UA: >30 /hpf — AB (ref 0–?)

## 2015-11-12 LAB — CULTURE, URINE COMPREHENSIVE

## 2015-11-16 ENCOUNTER — Telehealth: Payer: Self-pay

## 2015-11-16 NOTE — Telephone Encounter (Signed)
Spoke with pt in reference to -ucx. Pt voiced understanding.  

## 2015-11-16 NOTE — Telephone Encounter (Signed)
-----   Message from Nori Riis, PA-C sent at 11/13/2015  4:51 PM EDT ----- Please notify the patient that their urine culture is negative.

## 2015-11-21 ENCOUNTER — Emergency Department
Admission: EM | Admit: 2015-11-21 | Discharge: 2015-11-22 | Disposition: A | Discharge status: Home / Self Care | Payer: Medicare Other | Attending: Emergency Medicine | Admitting: Emergency Medicine

## 2015-11-21 DIAGNOSIS — Z7982 Long term (current) use of aspirin: Secondary | ICD-10-CM | POA: Insufficient documentation

## 2015-11-21 DIAGNOSIS — R197 Diarrhea, unspecified: Secondary | ICD-10-CM | POA: Diagnosis present

## 2015-11-21 DIAGNOSIS — A0811 Acute gastroenteropathy due to Norwalk agent: Secondary | ICD-10-CM | POA: Diagnosis not present

## 2015-11-21 DIAGNOSIS — Z7984 Long term (current) use of oral hypoglycemic drugs: Secondary | ICD-10-CM | POA: Diagnosis not present

## 2015-11-21 DIAGNOSIS — Z794 Long term (current) use of insulin: Secondary | ICD-10-CM | POA: Insufficient documentation

## 2015-11-21 DIAGNOSIS — M1990 Unspecified osteoarthritis, unspecified site: Secondary | ICD-10-CM | POA: Diagnosis not present

## 2015-11-21 DIAGNOSIS — E785 Hyperlipidemia, unspecified: Secondary | ICD-10-CM | POA: Insufficient documentation

## 2015-11-21 DIAGNOSIS — E669 Obesity, unspecified: Secondary | ICD-10-CM | POA: Diagnosis not present

## 2015-11-21 DIAGNOSIS — Z79899 Other long term (current) drug therapy: Secondary | ICD-10-CM | POA: Diagnosis not present

## 2015-11-21 DIAGNOSIS — E119 Type 2 diabetes mellitus without complications: Secondary | ICD-10-CM | POA: Insufficient documentation

## 2015-11-21 LAB — URINALYSIS COMPLETE WITH MICROSCOPIC (ARMC ONLY)
Bilirubin Urine: NEGATIVE
Glucose, UA: 150 mg/dL — AB
Nitrite: NEGATIVE
PROTEIN: 100 mg/dL — AB
SQUAMOUS EPITHELIAL / LPF: NONE SEEN
Specific Gravity, Urine: 1.009 (ref 1.005–1.030)
pH: 7 (ref 5.0–8.0)

## 2015-11-21 LAB — TROPONIN I: Troponin I: <0.03 ng/mL (ref <0.031)

## 2015-11-21 LAB — CBC WITH DIFFERENTIAL/PLATELET
Basophils Absolute: 0 10*3/uL (ref 0–0.1)
Basophils Relative: 0 %
Eosinophils Absolute: 0 10*3/uL (ref 0–0.7)
Eosinophils Relative: 1 %
HCT: 43.5 % (ref 35.0–47.0)
HEMOGLOBIN: 14.5 g/dL (ref 12.0–16.0)
LYMPHS ABS: 1.6 10*3/uL (ref 1.0–3.6)
MCH: 24.6 pg — AB (ref 26.0–34.0)
MCHC: 33.2 g/dL (ref 32.0–36.0)
MCV: 74 fL — AB (ref 80.0–100.0)
Monocytes Absolute: 0.5 10*3/uL (ref 0.2–0.9)
Monocytes Relative: 6 %
Neutro Abs: 6.4 10*3/uL (ref 1.4–6.5)
Platelets: 253 10*3/uL (ref 150–440)
RBC: 5.88 MIL/uL — AB (ref 3.80–5.20)
RDW: 16.4 % — ABNORMAL HIGH (ref 11.5–14.5)
WBC: 8.5 10*3/uL (ref 3.6–11.0)

## 2015-11-21 LAB — COMPREHENSIVE METABOLIC PANEL
ALK PHOS: 99 U/L (ref 38–126)
ALT: 18 U/L (ref 14–54)
AST: 21 U/L (ref 15–41)
Albumin: 4.4 g/dL (ref 3.5–5.0)
Anion gap: 14 (ref 5–15)
BUN: 9 mg/dL (ref 6–20)
CALCIUM: 9.5 mg/dL (ref 8.9–10.3)
CO2: 23 mmol/L (ref 22–32)
CREATININE: 0.65 mg/dL (ref 0.44–1.00)
Chloride: 92 mmol/L — ABNORMAL LOW (ref 101–111)
GFR calc non Af Amer: >60 mL/min (ref >60)
Glucose, Bld: 207 mg/dL — ABNORMAL HIGH (ref 65–99)
Potassium: 3.8 mmol/L (ref 3.5–5.1)
SODIUM: 129 mmol/L — AB (ref 135–145)
Total Bilirubin: 0.7 mg/dL (ref 0.3–1.2)
Total Protein: 8.3 g/dL — ABNORMAL HIGH (ref 6.5–8.1)

## 2015-11-21 MED ORDER — SULFAMETHOXAZOLE-TRIMETHOPRIM 800-160 MG PO TABS: 1.0000 | ORAL_TABLET | Freq: Two times a day (BID) | ORAL | Status: DC

## 2015-11-21 MED ORDER — SODIUM CHLORIDE 0.9 % IV SOLN
1000.0000 mL | Freq: Once | INTRAVENOUS | Status: AC
  Administered 2015-11-21: 1000 mL via INTRAVENOUS

## 2015-11-21 MED ORDER — ONDANSETRON HCL 4 MG PO TABS: 4.0000 mg | ORAL_TABLET | Freq: Every day | ORAL | Status: DC | PRN

## 2015-11-21 MED ORDER — METOCLOPRAMIDE HCL 5 MG/ML IJ SOLN
10.0000 mg | Freq: Once | INTRAMUSCULAR | Status: AC
  Administered 2015-11-21: 10 mg via INTRAVENOUS
  Filled 2015-11-21: qty 2

## 2015-11-21 MED ORDER — LOPERAMIDE HCL 2 MG PO CAPS
4.0000 mg | ORAL_CAPSULE | Freq: Once | ORAL | Status: AC
Start: 2015-11-21 — End: 2015-11-21
  Administered 2015-11-21: 4 mg via ORAL
  Filled 2015-11-21: qty 2

## 2015-11-21 MED ORDER — DEXTROSE 5 % IV SOLN
1.0000 g | Freq: Once | INTRAVENOUS | Status: AC
  Administered 2015-11-21: 1 g via INTRAVENOUS
  Filled 2015-11-21: qty 10

## 2015-11-21 MED ORDER — LORAZEPAM 2 MG/ML IJ SOLN
1.0000 mg | Freq: Once | INTRAMUSCULAR | Status: AC
Start: 2015-11-21 — End: 2015-11-21
  Administered 2015-11-21: 1 mg via INTRAVENOUS
  Filled 2015-11-21: qty 1

## 2015-11-21 MED ORDER — DICYCLOMINE HCL 20 MG PO TABS
20.0000 mg | ORAL_TABLET | Freq: Once | ORAL | Status: AC
  Administered 2015-11-21: 20 mg via ORAL
  Filled 2015-11-21: qty 1

## 2015-11-21 MED ORDER — ONDANSETRON HCL 4 MG/2ML IJ SOLN
4.0000 mg | Freq: Once | INTRAMUSCULAR | Status: AC
  Administered 2015-11-21: 4 mg via INTRAVENOUS
  Filled 2015-11-21: qty 2

## 2015-11-21 NOTE — ED Provider Notes (Signed)
[facility] Emergency Department Provider Note        Time seen: ----------------------------------------- 8:49 PM on 11/21/2015 -----------------------------------------    I have reviewed the triage vital signs and the nursing notes.   HISTORY  Chief Complaint Diarrhea; Emesis; and Abdominal Pain    HPI Brandi [name] is a 80 y.o. female who presents ER with nausea vomiting and diarrhea since yesterday. Patient states last episode of each was about 1 hour prior to arrival. She's been feeling weak, complains of generalized abdominal pain but denies any specific pain. She denies any other complaints. Has not had this problem in the past. She denies fevers chills or other complaints.   Past Medical History  Diagnosis Date  . Hypertension   . Diabetes mellitus without complication ([place])   . Incontinence in female   . Chronic cystitis   . Anemia   . Arthritis, degenerative   . HTN (hypertension)   . Depression   . GERD (gastroesophageal reflux disease)   . Heart disease   . Anxiety   . Murmur   . HLD (hyperlipidemia)   . Sleep apnea   . Urge incontinence of urine   . Obesity   . Headache     Patient Active Problem List   Diagnosis Date Noted  . Recurrent UTI 09/13/2015  . Cystocele, grade 3 09/13/2015  . Incontinence 09/13/2015    Past Surgical History  Procedure Laterality Date  . Breast biopsy Right 2004    neg  . Cardiac surgery      Stent  . Cholecystectomy    . Joint replacement      TKR Bilateral  . Septoplasty    . Bunionectomy      Allergies Micardis; Accupril; and Quinapril  Social History Social History  Substance Use Topics  . Smoking status: Never Smoker   . Smokeless tobacco: Never Used  . Alcohol Use: No    Review of Systems Constitutional: Negative for fever. Eyes: Negative for visual changes. ENT: Negative for sore throat. Cardiovascular: Negative for chest pain. Respiratory: Negative for shortness of  breath. Gastrointestinal: Positive for abdominal pain, vomiting and diarrhea Genitourinary: Negative for dysuria. Musculoskeletal: Negative for back pain. Skin: Negative for rash. Neurological: Negative for headaches, positive for weakness  10-point ROS otherwise negative.  ____________________________________________   PHYSICAL EXAM:  VITAL SIGNS: ED Triage Vitals  Enc Vitals Group     BP 11/21/15 2020 122/109 mmHg     Pulse Rate 11/21/15 2020 113     Resp 11/21/15 2020 20     Temp 11/21/15 2020 97.5 F (36.4 C)     Temp Source 11/21/15 2020 Oral     SpO2 11/21/15 2020 97 %     Weight 11/21/15 2020 205 lb (92.987 kg)     Height 11/21/15 2020 5\' 1"  (1.549 m)     Head Cir --      Peak Flow --      Pain Score --      Pain Loc --      Pain Edu? --      Excl. in [place]? --     Constitutional: Alert and oriented. Mild distress Eyes: Conjunctivae are normal. PERRL. Normal extraocular movements. ENT   Head: Normocephalic and atraumatic.   Nose: No congestion/rhinnorhea.   Mouth/Throat: Mucous membranes are moist.   Neck: No stridor. Cardiovascular: Normal rate, regular rhythm. No murmurs, rubs, or gallops. Respiratory: Normal respiratory effort without tachypnea nor retractions. Breath sounds are clear and  equal bilaterally. No wheezes/rales/rhonchi. Gastrointestinal: Soft and nontender. Normal bowel sounds Musculoskeletal: Nontender with normal range of motion in all extremities. No lower extremity tenderness nor edema. Neurologic:  Normal speech and language. No gross focal neurologic deficits are appreciated.  Skin:  Skin is warm, dry and intact. No rash noted. Psychiatric: Mood and affect are normal. Speech and behavior are normal.  ____________________________________________  ED COURSE:  Pertinent labs & imaging results that were available during my care of the patient were reviewed by me and considered in my medical decision making (see chart for  details). Patient presents with what is likely Norovirus infection. She will receive IV fluids and antiemetics. ____________________________________________    LABS (pertinent positives/negatives)  Labs Reviewed  CBC WITH DIFFERENTIAL/PLATELET - Abnormal; Notable for the following:    RBC 5.88 (*)    MCV 74.0 (*)    MCH 24.6 (*)    RDW 16.4 (*)    All other components within normal limits  COMPREHENSIVE METABOLIC PANEL - Abnormal; Notable for the following:    Sodium 129 (*)    Chloride 92 (*)    Glucose, Bld 207 (*)    Total Protein 8.3 (*)    All other components within normal limits  TROPONIN I  URINALYSIS COMPLETEWITH MICROSCOPIC ([facility] ONLY)   ____________________________________________  FINAL ASSESSMENT AND PLAN  Norovirus  Plan: Patient with labs as dictated above. Patient has received IV fluids, IV Zofran as well as oral Bentyl and loperamide.Patient was feeling somewhat better, required additional dose of Ativan and Reglan. There is likely an anxiety component to this as well. She is stable for outpatient follow-up with her doctor.   Earleen Newport, MD   Note: This dictation was prepared with Dragon dictation. Any transcriptional errors that result from this process are unintentional   Earleen Newport, MD 11/21/15 2147

## 2015-11-21 NOTE — ED Notes (Signed)
Pt c/o N/V/D since yesterday; last episode of each was about 1 hour pta; pt feeling weak; c/o low midline abd pain but denies urinary s/s

## 2015-11-21 NOTE — Discharge Instructions (Signed)
Norovirus Infection °A norovirus infection is caused by exposure to a virus in a group of similar viruses (noroviruses). This type of infection causes inflammation in your stomach and intestines (gastroenteritis). Norovirus is the most common cause of gastroenteritis. It also causes food poisoning. °Anyone can get a norovirus infection. It spreads very easily (contagious). You can get it from contaminated food, water, surfaces, or other people. Norovirus is found in the stool or vomit of infected people. You can spread the infection as soon as you feel sick until 2 weeks after you recover.  °Symptoms usually begin within 2 days after you become infected. Most norovirus symptoms affect the digestive system. °CAUSES °Norovirus infection is caused by contact with norovirus. You can catch norovirus if you: °· Eat or drink something contaminated with norovirus. °· Touch surfaces or objects contaminated with norovirus and then put your hand in your mouth. °· Have direct contact with an infected person who has symptoms. °· Share food, drink, or utensils with someone with who is sick with norovirus. °SIGNS AND SYMPTOMS °Symptoms of norovirus may include: °· Nausea. °· Vomiting. °· Diarrhea. °· Stomach cramps. °· Fever. °· Chills. °· Headache. °· Muscle aches. °· Tiredness. °DIAGNOSIS °Your health care provider may suspect norovirus based on your symptoms and physical exam. Your health care provider may also test a sample of your stool or vomit for the virus.  °TREATMENT °There is no specific treatment for norovirus. Most people get better without treatment in about 2 days. °HOME CARE INSTRUCTIONS °· Replace lost fluids by drinking plenty of water or rehydration fluids containing important minerals called electrolytes. This prevents dehydration. Drink enough fluid to keep your urine clear or pale yellow. °· Do not prepare food for others while you are infected. Wait at least 3 days after recovering from the illness to do  that. °PREVENTION  °· Wash your hands often, especially after using the toilet or changing a diaper. °· Wash fruits and vegetables thoroughly before preparing or serving them. °· Throw out any food that a sick person may have touched. °· Disinfect contaminated surfaces immediately after someone in the household has been sick. Use a bleach-based household cleaner. °· Immediately remove and wash soiled clothes or sheets. °SEEK MEDICAL CARE IF: °· Your vomiting, diarrhea, and stomach pain is getting worse. °· Your symptoms of norovirus do not go away after 2-3 days. °SEEK IMMEDIATE MEDICAL CARE IF:  °You develop symptoms of dehydration that do not improve with fluid replacement. This may include: °· Excessive sleepiness. °· Lack of tears. °· Dry mouth. °· Dizziness when standing. °· Weak pulse. °  °This information is not intended to replace advice given to you by your health care provider. Make sure you discuss any questions you have with your health care provider. °  °Document Released: 08/26/2002 Document Revised: 06/26/2014 Document Reviewed: 11/13/2013 °Elsevier Interactive Patient Education ©2016 [employer] ° °

## 2016-01-18 ENCOUNTER — Ambulatory Visit: Payer: Medicare PPO

## 2016-02-01 ENCOUNTER — Ambulatory Visit
Admission: RE | Admit: 2016-02-01 | Discharge: 2016-02-01 | Disposition: A | Discharge status: Home / Self Care | Payer: Medicare Other | Source: Ambulatory Visit | Attending: Internal Medicine | Admitting: Internal Medicine

## 2016-02-01 ENCOUNTER — Other Ambulatory Visit: Payer: Self-pay | Admitting: Internal Medicine

## 2016-02-01 DIAGNOSIS — Z1231 Encounter for screening mammogram for malignant neoplasm of breast: Secondary | ICD-10-CM | POA: Diagnosis not present

## 2016-03-31 ENCOUNTER — Ambulatory Visit: Payer: Medicare Other | Attending: Neurology

## 2016-03-31 DIAGNOSIS — G4733 Obstructive sleep apnea (adult) (pediatric): Secondary | ICD-10-CM | POA: Insufficient documentation

## 2016-04-14 ENCOUNTER — Emergency Department: Payer: Medicare Other

## 2016-04-14 ENCOUNTER — Encounter: Payer: Self-pay | Admitting: Emergency Medicine

## 2016-04-14 ENCOUNTER — Inpatient Hospital Stay
Admission: EM | Admit: 2016-04-14 | Discharge: 2016-04-17 | DRG: 872 | Disposition: A | Discharge status: Home / Self Care | Payer: Medicare Other | Attending: Internal Medicine | Admitting: Internal Medicine

## 2016-04-14 DIAGNOSIS — Z87440 Personal history of urinary (tract) infections: Secondary | ICD-10-CM

## 2016-04-14 DIAGNOSIS — D638 Anemia in other chronic diseases classified elsewhere: Secondary | ICD-10-CM | POA: Diagnosis present

## 2016-04-14 DIAGNOSIS — R0602 Shortness of breath: Secondary | ICD-10-CM | POA: Diagnosis present

## 2016-04-14 DIAGNOSIS — N390 Urinary tract infection, site not specified: Secondary | ICD-10-CM | POA: Diagnosis not present

## 2016-04-14 DIAGNOSIS — Z7902 Long term (current) use of antithrombotics/antiplatelets: Secondary | ICD-10-CM

## 2016-04-14 DIAGNOSIS — A4151 Sepsis due to Escherichia coli [E. coli]: Secondary | ICD-10-CM | POA: Diagnosis not present

## 2016-04-14 DIAGNOSIS — J209 Acute bronchitis, unspecified: Secondary | ICD-10-CM | POA: Diagnosis present

## 2016-04-14 DIAGNOSIS — R011 Cardiac murmur, unspecified: Secondary | ICD-10-CM | POA: Diagnosis present

## 2016-04-14 DIAGNOSIS — Z9049 Acquired absence of other specified parts of digestive tract: Secondary | ICD-10-CM | POA: Diagnosis not present

## 2016-04-14 DIAGNOSIS — Z6839 Body mass index (BMI) 39.0-39.9, adult: Secondary | ICD-10-CM

## 2016-04-14 DIAGNOSIS — Z888 Allergy status to other drugs, medicaments and biological substances status: Secondary | ICD-10-CM

## 2016-04-14 DIAGNOSIS — E871 Hypo-osmolality and hyponatremia: Secondary | ICD-10-CM | POA: Diagnosis present

## 2016-04-14 DIAGNOSIS — F419 Anxiety disorder, unspecified: Secondary | ICD-10-CM | POA: Diagnosis present

## 2016-04-14 DIAGNOSIS — K219 Gastro-esophageal reflux disease without esophagitis: Secondary | ICD-10-CM | POA: Diagnosis present

## 2016-04-14 DIAGNOSIS — I10 Essential (primary) hypertension: Secondary | ICD-10-CM | POA: Diagnosis present

## 2016-04-14 DIAGNOSIS — E669 Obesity, unspecified: Secondary | ICD-10-CM | POA: Diagnosis present

## 2016-04-14 DIAGNOSIS — R6520 Severe sepsis without septic shock: Secondary | ICD-10-CM | POA: Diagnosis present

## 2016-04-14 DIAGNOSIS — G4730 Sleep apnea, unspecified: Secondary | ICD-10-CM | POA: Diagnosis present

## 2016-04-14 DIAGNOSIS — Z7951 Long term (current) use of inhaled steroids: Secondary | ICD-10-CM

## 2016-04-14 DIAGNOSIS — Z7982 Long term (current) use of aspirin: Secondary | ICD-10-CM | POA: Diagnosis not present

## 2016-04-14 DIAGNOSIS — A419 Sepsis, unspecified organism: Secondary | ICD-10-CM | POA: Diagnosis not present

## 2016-04-14 DIAGNOSIS — R7881 Bacteremia: Secondary | ICD-10-CM

## 2016-04-14 DIAGNOSIS — E785 Hyperlipidemia, unspecified: Secondary | ICD-10-CM | POA: Diagnosis present

## 2016-04-14 DIAGNOSIS — F329 Major depressive disorder, single episode, unspecified: Secondary | ICD-10-CM | POA: Diagnosis present

## 2016-04-14 DIAGNOSIS — R410 Disorientation, unspecified: Secondary | ICD-10-CM | POA: Diagnosis present

## 2016-04-14 DIAGNOSIS — E1165 Type 2 diabetes mellitus with hyperglycemia: Secondary | ICD-10-CM | POA: Diagnosis present

## 2016-04-14 DIAGNOSIS — N179 Acute kidney failure, unspecified: Secondary | ICD-10-CM | POA: Diagnosis present

## 2016-04-14 DIAGNOSIS — Z794 Long term (current) use of insulin: Secondary | ICD-10-CM

## 2016-04-14 DIAGNOSIS — Z79899 Other long term (current) drug therapy: Secondary | ICD-10-CM

## 2016-04-14 LAB — URINALYSIS COMPLETE WITH MICROSCOPIC (ARMC ONLY)
BILIRUBIN URINE: NEGATIVE
GLUCOSE, UA: NEGATIVE mg/dL
HGB URINE DIPSTICK: NEGATIVE
KETONES UR: NEGATIVE mg/dL
NITRITE: NEGATIVE
Protein, ur: 100 mg/dL — AB
SPECIFIC GRAVITY, URINE: 1.018 (ref 1.005–1.030)
pH: 5 (ref 5.0–8.0)

## 2016-04-14 LAB — CBC WITH DIFFERENTIAL/PLATELET
BASOS PCT: 0 %
Basophils Absolute: 0 10*3/uL (ref 0–0.1)
EOS ABS: 0 10*3/uL (ref 0–0.7)
EOS PCT: 0 %
HCT: 41.6 % (ref 35.0–47.0)
Hemoglobin: 13.3 g/dL (ref 12.0–16.0)
LYMPHS ABS: 0.5 10*3/uL — AB (ref 1.0–3.6)
Lymphocytes Relative: 5 %
MCH: 25.6 pg — AB (ref 26.0–34.0)
MCHC: 31.9 g/dL — ABNORMAL LOW (ref 32.0–36.0)
MCV: 80.1 fL (ref 80.0–100.0)
Monocytes Absolute: 0.4 10*3/uL (ref 0.2–0.9)
Monocytes Relative: 3 %
Neutro Abs: 10.9 10*3/uL — ABNORMAL HIGH (ref 1.4–6.5)
Neutrophils Relative %: 92 %
PLATELETS: 208 10*3/uL (ref 150–440)
RBC: 5.19 MIL/uL (ref 3.80–5.20)
RDW: 16 % — ABNORMAL HIGH (ref 11.5–14.5)
WBC: 11.9 10*3/uL — AB (ref 3.6–11.0)

## 2016-04-14 LAB — COMPREHENSIVE METABOLIC PANEL
ALT: 17 U/L (ref 14–54)
ANION GAP: 11 (ref 5–15)
AST: 25 U/L (ref 15–41)
Albumin: 3.8 g/dL (ref 3.5–5.0)
Alkaline Phosphatase: 75 U/L (ref 38–126)
BUN: 14 mg/dL (ref 6–20)
CHLORIDE: 98 mmol/L — AB (ref 101–111)
CO2: 25 mmol/L (ref 22–32)
CREATININE: 1.09 mg/dL — AB (ref 0.44–1.00)
Calcium: 9.1 mg/dL (ref 8.9–10.3)
GFR calc non Af Amer: 47 mL/min — ABNORMAL LOW (ref >60)
GFR, EST AFRICAN AMERICAN: 55 mL/min — AB (ref >60)
Glucose, Bld: 270 mg/dL — ABNORMAL HIGH (ref 65–99)
Potassium: 4.3 mmol/L (ref 3.5–5.1)
SODIUM: 134 mmol/L — AB (ref 135–145)
Total Bilirubin: 0.6 mg/dL (ref 0.3–1.2)
Total Protein: 7.5 g/dL (ref 6.5–8.1)

## 2016-04-14 LAB — LACTIC ACID, PLASMA
Lactic Acid, Venous: 3.2 mmol/L (ref 0.5–1.9)
Lactic Acid, Venous: 1.8 mmol/L (ref 0.5–1.9)

## 2016-04-14 LAB — PROCALCITONIN: PROCALCITONIN: 21.83 ng/mL

## 2016-04-14 LAB — GLUCOSE, CAPILLARY
Glucose-Capillary: 166 mg/dL — ABNORMAL HIGH (ref 65–99)
Glucose-Capillary: 180 mg/dL — ABNORMAL HIGH (ref 65–99)

## 2016-04-14 LAB — TROPONIN I: Troponin I: <0.03 ng/mL (ref <0.03)

## 2016-04-14 LAB — FIBRIN DERIVATIVES D-DIMER (ARMC ONLY): FIBRIN DERIVATIVES D-DIMER (ARMC): 2502 — AB (ref 0–499)

## 2016-04-14 LAB — MRSA PCR SCREENING: MRSA BY PCR: NEGATIVE

## 2016-04-14 LAB — BRAIN NATRIURETIC PEPTIDE: B NATRIURETIC PEPTIDE 5: 100 pg/mL (ref 0.0–100.0)

## 2016-04-14 MED ORDER — ASPIRIN 300 MG RE SUPP: 300.0000 mg | RECTAL | Status: DC

## 2016-04-14 MED ORDER — ACETAMINOPHEN 325 MG PO TABS
650.0000 mg | ORAL_TABLET | Freq: Once | ORAL | Status: AC
  Administered 2016-04-14: 650 mg via ORAL
  Filled 2016-04-14: qty 2

## 2016-04-14 MED ORDER — FAMOTIDINE 20 MG PO TABS
40.0000 mg | ORAL_TABLET | Freq: Every day | ORAL | Status: DC
  Administered 2016-04-14: 40 mg via ORAL
  Filled 2016-04-14: qty 2

## 2016-04-14 MED ORDER — DIPHENHYDRAMINE HCL 50 MG/ML IJ SOLN
12.5000 mg | Freq: Once | INTRAMUSCULAR | Status: AC
  Administered 2016-04-14: 12.5 mg via INTRAVENOUS

## 2016-04-14 MED ORDER — IPRATROPIUM-ALBUTEROL 0.5-2.5 (3) MG/3ML IN SOLN: 3.0000 mL | RESPIRATORY_TRACT | Status: DC | PRN

## 2016-04-14 MED ORDER — CEFTRIAXONE SODIUM-DEXTROSE 2-2.22 GM-% IV SOLR
2.0000 g | INTRAVENOUS | Status: DC
  Administered 2016-04-15 – 2016-04-16 (×2): 2 g via INTRAVENOUS
  Filled 2016-04-14 (×4): qty 50

## 2016-04-14 MED ORDER — SODIUM CHLORIDE 0.9 % IV SOLN: 250.0000 mL | INTRAVENOUS | Status: DC | PRN

## 2016-04-14 MED ORDER — CLOPIDOGREL BISULFATE 75 MG PO TABS
75.0000 mg | ORAL_TABLET | Freq: Every day | ORAL | Status: DC
  Administered 2016-04-14 – 2016-04-16 (×3): 75 mg via ORAL
  Filled 2016-04-14 (×3): qty 1

## 2016-04-14 MED ORDER — ACETAMINOPHEN 325 MG PO TABS
650.0000 mg | ORAL_TABLET | ORAL | Status: DC | PRN
  Administered 2016-04-16: 650 mg via ORAL
  Filled 2016-04-14: qty 2

## 2016-04-14 MED ORDER — ASPIRIN EC 81 MG PO TBEC
81.0000 mg | DELAYED_RELEASE_TABLET | Freq: Every day | ORAL | Status: DC
  Administered 2016-04-14 – 2016-04-16 (×3): 81 mg via ORAL
  Filled 2016-04-14 (×3): qty 1

## 2016-04-14 MED ORDER — ASPIRIN 81 MG PO CHEW: 324.0000 mg | CHEWABLE_TABLET | ORAL | Status: DC

## 2016-04-14 MED ORDER — ONDANSETRON HCL 4 MG/2ML IJ SOLN: 4.0000 mg | Freq: Four times a day (QID) | INTRAMUSCULAR | Status: DC | PRN

## 2016-04-14 MED ORDER — CEFTRIAXONE SODIUM-DEXTROSE 1-3.74 GM-% IV SOLR
1.0000 g | Freq: Once | INTRAVENOUS | Status: AC
  Administered 2016-04-14: 1 g via INTRAVENOUS
  Filled 2016-04-14: qty 50

## 2016-04-14 MED ORDER — ONDANSETRON HCL 4 MG/2ML IJ SOLN
4.0000 mg | Freq: Once | INTRAMUSCULAR | Status: AC
  Administered 2016-04-14: 4 mg via INTRAVENOUS
  Filled 2016-04-14: qty 2

## 2016-04-14 MED ORDER — SODIUM CHLORIDE 0.9 % IV BOLUS (SEPSIS)
1000.0000 mL | Freq: Once | INTRAVENOUS | Status: AC
  Administered 2016-04-14: 1000 mL via INTRAVENOUS

## 2016-04-14 MED ORDER — DEXTROSE 5 % IV SOLN: 1.0000 g | Freq: Once | INTRAVENOUS | Status: DC

## 2016-04-14 MED ORDER — ALBUTEROL SULFATE (2.5 MG/3ML) 0.083% IN NEBU
5.0000 mg | INHALATION_SOLUTION | Freq: Once | RESPIRATORY_TRACT | Status: AC
  Administered 2016-04-14: 5 mg via RESPIRATORY_TRACT
  Filled 2016-04-14: qty 6

## 2016-04-14 MED ORDER — INSULIN ASPART 100 UNIT/ML ~~LOC~~ SOLN
2.0000 [IU] | SUBCUTANEOUS | Status: DC
Start: 2016-04-14 — End: 2016-04-15
  Administered 2016-04-14 – 2016-04-15 (×2): 4 [IU] via SUBCUTANEOUS
  Administered 2016-04-15: 2 [IU] via SUBCUTANEOUS
  Filled 2016-04-14: qty 4
  Filled 2016-04-14: qty 2
  Filled 2016-04-14: qty 4

## 2016-04-14 MED ORDER — IOPAMIDOL (ISOVUE-370) INJECTION 76%
75.0000 mL | Freq: Once | INTRAVENOUS | Status: AC | PRN
  Administered 2016-04-14: 75 mL via INTRAVENOUS

## 2016-04-14 MED ORDER — HEPARIN SODIUM (PORCINE) 5000 UNIT/ML IJ SOLN
5000.0000 [IU] | Freq: Three times a day (TID) | INTRAMUSCULAR | Status: DC
  Administered 2016-04-14 – 2016-04-15 (×2): 5000 [IU] via SUBCUTANEOUS
  Filled 2016-04-14 (×2): qty 1

## 2016-04-14 MED ORDER — DIPHENHYDRAMINE HCL 50 MG/ML IJ SOLN
INTRAMUSCULAR | Status: AC
  Administered 2016-04-14: 12.5 mg via INTRAVENOUS
  Filled 2016-04-14: qty 1

## 2016-04-14 MED ORDER — SODIUM CHLORIDE 0.9 % IV SOLN
INTRAVENOUS | Status: DC
  Administered 2016-04-14: 22:00:00 via INTRAVENOUS

## 2016-04-14 MED ORDER — DEXTROSE 5 % IV SOLN: 2.0000 g | Freq: Two times a day (BID) | INTRAVENOUS | Status: DC

## 2016-04-14 MED ORDER — SIMVASTATIN 40 MG PO TABS: 40.0000 mg | ORAL_TABLET | Freq: Every day | ORAL | Status: DC

## 2016-04-14 MED ORDER — VERAPAMIL HCL ER 180 MG PO TBCR
300.0000 mg | EXTENDED_RELEASE_TABLET | Freq: Every day | ORAL | Status: DC
  Administered 2016-04-14 – 2016-04-17 (×4): 300 mg via ORAL
  Filled 2016-04-14: qty 3
  Filled 2016-04-14 (×4): qty 1

## 2016-04-14 NOTE — ED Notes (Signed)
Patient transported to CT 

## 2016-04-14 NOTE — ED Provider Notes (Signed)
[facility] Emergency Department Provider Note   ____________________________________________    I have reviewed the triage vital signs and the nursing notes.   HISTORY  Chief Complaint Shortness of Breath     HPI Brandi [name] is a 80 y.o. female presents with complaints of shortness of breath. Patient reports at 4 AM this morning she woke up feeling short of breath. She had been wearing her CPAP which she normally does. She felt well the night before. She does report history of temperature of 100.4 today. No cough. Very minimal chest discomfort substernally. No pleurisy. No calf pain or swelling. No recent travel. No history of PE. She does not smoke   Past Medical History:  Diagnosis Date  . Anemia   . Anxiety   . Arthritis, degenerative   . Chronic cystitis   . Depression   . Diabetes mellitus without complication ([place])   . GERD (gastroesophageal reflux disease)   . Headache   . Heart disease   . HLD (hyperlipidemia)   . HTN (hypertension)   . Hypertension   . Incontinence in female   . Murmur   . Obesity   . Sleep apnea   . Urge incontinence of urine     Patient Active Problem List   Diagnosis Date Noted  . Recurrent UTI 09/13/2015  . Cystocele, grade 3 09/13/2015  . Incontinence 09/13/2015    Past Surgical History:  Procedure Laterality Date  . BREAST BIOPSY Right 2004   neg  . BUNIONECTOMY    . CARDIAC SURGERY     Stent  . CHOLECYSTECTOMY    . JOINT REPLACEMENT     TKR Bilateral  . SEPTOPLASTY      Prior to Admission medications   Medication Sig Start Date End Date Taking? Authorizing Provider  amoxicillin-clavulanate (AUGMENTIN) 875-125 MG tablet Take 1 tablet by mouth 2 (two) times daily. 10/01/15   Carrie Mew, MD  aspirin EC 81 MG tablet Take 81 mg by mouth daily.    Historical Provider, MD  budesonide-formoterol (SYMBICORT) 80-4.5 MCG/ACT inhaler Inhale 2 puffs into the lungs 2 (two) times daily.     Historical Provider, MD  Calcium Polycarbophil (FIBER-LAX PO) Take 1 tablet by mouth daily.    Historical Provider, MD  clopidogrel (PLAVIX) 75 MG tablet Take 75 mg by mouth daily.    Historical Provider, MD  CRANBERRY PO Take 1 capsule by mouth daily.    Historical Provider, MD  Cyanocobalamin (VITAMIN B-12 PO) Take 1 tablet by mouth daily.    Historical Provider, MD  glipiZIDE (GLUCOTROL) 5 MG tablet 5 mg 2 (two) times daily.  08/19/13   Historical Provider, MD  HYDROcodone-acetaminophen (NORCO/VICODIN) 5-325 MG tablet Take 1 tablet by mouth once. Patient not taking: Reported on 09/09/2015 04/26/15   Ahmed Prima, MD  Insulin Glargine (LANTUS SOLOSTAR) 100 UNIT/ML Solostar Pen Inject into the skin. 07/20/15   Historical Provider, MD  meloxicam (MOBIC) 15 MG tablet Take 15 mg by mouth daily.    Historical Provider, MD  metFORMIN (GLUCOPHAGE-XR) 500 MG 24 hr tablet Take 500 mg by mouth. Reported on 09/09/2015 03/11/12   Historical Provider, MD  mirabegron ER (MYRBETRIQ) 25 MG TB24 tablet Take 1 tablet (25 mg total) by mouth daily. 09/09/15   Nori Riis, PA-C  Multiple Vitamins-Minerals (OCUVITE PRESERVISION PO) Take 1 capsule by mouth 2 (two) times daily. Reported on 09/09/2015    Historical Provider, MD  ondansetron (ZOFRAN) 4 MG tablet Take 1  tablet (4 mg total) by mouth daily as needed for nausea or vomiting. 11/21/15   Earleen Newport, MD  pantoprazole (PROTONIX) 40 MG tablet Take 40 mg by mouth daily.    Historical Provider, MD  potassium chloride SA (K-DUR,KLOR-CON) 20 MEQ tablet Take 20 mEq by mouth 2 (two) times daily.    Historical Provider, MD  pregabalin (LYRICA) 75 MG capsule Take 75 mg by mouth 2 (two) times daily.    Historical Provider, MD  simvastatin (ZOCOR) 40 MG tablet Take 40 mg by mouth daily at 6 PM.    Historical Provider, MD  SitaGLIPtin-MetFORMIN HCl (JANUMET XR) 50-1000 MG TB24 Take 1 tablet by mouth 2 (two) times daily. 12/02/14   Historical Provider, MD    sulfamethoxazole-trimethoprim (BACTRIM DS) 800-160 MG per tablet Take 1 tablet by mouth 2 (two) times daily. Patient not taking: Reported on 09/09/2015 01/16/15   Earleen Newport, MD  sulfamethoxazole-trimethoprim (BACTRIM DS) 800-160 MG tablet Take 1 tablet by mouth 2 (two) times daily. 11/21/15   Earleen Newport, MD  valsartan (DIOVAN) 160 MG tablet Take 160 mg by mouth daily. Reported on 09/09/2015    Historical Provider, MD  Verapamil HCl CR 300 MG CP24 Take 1 capsule by mouth daily.    Historical Provider, MD  Vilazodone HCl (VIIBRYD) 20 MG TABS Take 40 mg by mouth.  03/11/12   Historical Provider, MD     Allergies Micardis [telmisartan]; Accupril [quinapril hcl]; and Quinapril  Family History  Problem Relation Age of Onset  . Breast cancer Cousin   . Skin cancer    . Diabetes    . Hypercholesterolemia    . Hypertension    . Bladder Cancer Maternal Aunt   . COPD    . Kidney disease Neg Hx     Social History Social History  Substance Use Topics  . Smoking status: Never Smoker  . Smokeless tobacco: Never Used  . Alcohol use No    Review of Systems  Constitutional: As above  ENT: No Throat swelling Cardiovascular: As above Respiratory: As above Gastrointestinal: No abdominal pain.  No nausea, no vomiting.   Genitourinary: Negative for dysuria. Musculoskeletal: Negative for back pain. Skin: Negative for rash. Neurological: Negative for headaches or weakness  10-point ROS otherwise negative.  ____________________________________________   PHYSICAL EXAM:  VITAL SIGNS: ED Triage Vitals  Enc Vitals Group     BP 04/14/16 1533 116/72     Pulse Rate 04/14/16 1533 96     Resp 04/14/16 1533 20     Temp 04/14/16 1533 98.8 F (37.1 C)     Temp Source 04/14/16 1533 Oral     SpO2 04/14/16 1533 99 %     Weight 04/14/16 1534 210 lb (95.3 kg)     Height 04/14/16 1534 5\' 1"  (1.549 m)     Head Circumference --      Peak Flow --      Pain Score --      Pain Loc --       Pain Edu? --      Excl. in [place]? --     Constitutional: Alert and oriented. No acute distress. Pleasant and interactive Eyes: Conjunctivae are normal.  Head: Atraumatic. Nose: No congestion/rhinnorhea. Mouth/Throat: Mucous membranes are moist.    Cardiovascular: Normal rate, regular rhythm. Grossly normal heart sounds.  Good peripheral circulation. Respiratory: Normal respiratory effort.  No retractions. Lungs CTAB. Gastrointestinal: Soft and nontender. No distention.  No CVA tenderness. Genitourinary: deferred Musculoskeletal: No  lower extremity tenderness nor edema.  Warm and well perfused Neurologic:  Normal speech and language. No gross focal neurologic deficits are appreciated.  Skin:  Skin is warm, dry and intact. No rash noted. Psychiatric: Mood and affect are normal. Speech and behavior are normal.  ____________________________________________   LABS (all labs ordered are listed, but only abnormal results are displayed)  Labs Reviewed  CBC WITH DIFFERENTIAL/PLATELET - Abnormal; Notable for the following:       Result Value   WBC 11.9 (*)    MCH 25.6 (*)    MCHC 31.9 (*)    RDW 16.0 (*)    Neutro Abs 10.9 (*)    Lymphs Abs 0.5 (*)    All other components within normal limits  COMPREHENSIVE METABOLIC PANEL - Abnormal; Notable for the following:    Sodium 134 (*)    Chloride 98 (*)    Glucose, Bld 270 (*)    Creatinine, Ser 1.09 (*)    GFR calc non Af Amer 47 (*)    GFR calc Af Amer 55 (*)    All other components within normal limits  FIBRIN DERIVATIVES D-DIMER ([facility] ONLY) - Abnormal; Notable for the following:    Fibrin derivatives D-dimer [facility]) 2,502 (*)    All other components within normal limits  URINALYSIS COMPLETEWITH MICROSCOPIC ([facility] ONLY) - Abnormal; Notable for the following:    Color, Urine YELLOW (*)    APPearance CLOUDY (*)    Protein, ur 100 (*)    Leukocytes, UA 3+ (*)    Bacteria, UA FEW (*)    Squamous Epithelial / LPF 0-5 (*)    All  other components within normal limits  CULTURE, BLOOD (ROUTINE X 2)  CULTURE, BLOOD (ROUTINE X 2)  URINE CULTURE  TROPONIN I  BRAIN NATRIURETIC PEPTIDE  LACTIC ACID, PLASMA  LACTIC ACID, PLASMA   ____________________________________________  EKG  ED ECG REPORT I, Lavonia Drafts, the attending physician, personally viewed and interpreted this ECG.  Date: 04/14/2016 EKG Time: 3:45 PM Rate: 91 Rhythm: normal sinus rhythm QRS Axis: normal Intervals: normal ST/T Wave abnormalities: normal Conduction Disturbances: none Narrative Interpretation: unremarkable  ____________________________________________  RADIOLOGY  X-rays unremarkable ____________________________________________   PROCEDURES  Procedure(s) performed: No    Critical Care performed: yes  CRITICAL CARE Performed by: Lavonia Drafts   Total critical care time: 40 minutes  Critical care time was exclusive of separately billable procedures and treating other patients.  Critical care was necessary to treat or prevent imminent or life-threatening deterioration.  Critical care was time spent personally by me on the following activities: development of treatment plan with patient and/or surrogate as well as nursing, discussions with consultants, evaluation of patient's response to treatment, examination of patient, obtaining history from patient or surrogate, ordering and performing treatments and interventions, ordering and review of laboratory studies, ordering and review of radiographic studies, pulse oximetry and re-evaluation of patient's condition.  ____________________________________________   INITIAL IMPRESSION / ASSESSMENT AND PLAN / ED COURSE  Pertinent labs & imaging results that were available during my care of the patient were reviewed by me and considered in my medical decision making (see chart for details).  Patient presents with shortness of breath of relatively sudden onset. She does  report a fever of 100.4 today. Chest x-ray is unremarkable, lab work is pending.  Clinical Course  Patient with significantly elevated d-dimer I will send for CT angiography of her chest ____________________________________________ After CT scan patient's heart rate increased, she reports she is  feeling improved however her heart rate is been as high as 130. We will give IV fluids and attempt to find a cause for this. No symptoms of allergic reaction to contrast.  ----------------------------------------- 7:33 PM on 04/14/2016 -----------------------------------------  Decided to give small amount of Benadryl given that patient's heart rate continues to be elevated and she has nausea although no rash or itching.  Then rechecked temperature and found that she had developed a fever 103.0. I suspect this will be related to a urinary tract infection. We will treat with antibiotics and call a code sepsis.  ----------------------------------------- 7:51 PM on 04/14/2016 -----------------------------------------  Urinalysis is resulted, consistent with UTI. Rocephin infusing. Patient has become more confused with elevated blood pressure. Given that the patient is apparently septic we will not give antihypertensives.   ----------------------------------------- 7:57 PM on 04/14/2016 -----------------------------------------  Discussed with ICU physician, they will admit   FINAL CLINICAL IMPRESSION(S) / ED DIAGNOSES  Final diagnoses:  Sepsis, due to unspecified organism [facility])      NEW MEDICATIONS STARTED DURING THIS VISIT:  New Prescriptions   No medications on file     Note:  This document was prepared using Dragon voice recognition software and may include unintentional dictation errors.    Lavonia Drafts, MD 04/14/16 [phone]

## 2016-04-14 NOTE — ED Notes (Signed)
Upon return from CT, pt shaking uncontrollably and HR 130s-140s A.fib. EKG repeated and given to EDP. Warm blankets  placed on pt

## 2016-04-14 NOTE — ED Notes (Signed)
Pt placed on 2L oxygen 

## 2016-04-14 NOTE — H&P (Signed)
PULMONARY / CRITICAL CARE MEDICINE   Name: TATASHA MI MRN: [id number] DOB: 1935-09-20    ADMISSION DATE:  04/14/2016 CONSULTATION DATE:  04/14/16  REFERRING MD:  Dr. Lavonia Drafts  CHIEF COMPLAINT:  Shortness of breath  HISTORY OF PRESENT ILLNESS:   Nazyiah Wellman is a 80 year old female with past medical history significant for anemia, anxiety, chronic cystitis, depression, diabetes mellitus, GERD, hyperlipidemia, hypertension, obesity and sleep apnea. Patient states that she woke up around 4 AM on 10/27 with shortness of breath. She wears CPAP at night. She noted to have a temp of 100.69f. Patient presented to ED with complaints of shortness of breath and feeling nauseous. Her CT chest was negative for pulmonary embolism. When she came back from CT she was confused and was vomiting. She received Benadryl and Zofran. Upon rechecking her temperature it was noted that her temperature was up to 102.25F. Her UA was concerning for severe sepsis. Patient was started on ceftriaxone and [facility] team was called to admit the patient.  PAST MEDICAL HISTORY :  She  has a past medical history of Anemia; Anxiety; Arthritis, degenerative; Chronic cystitis; Depression; Diabetes mellitus without complication ([place]); GERD (gastroesophageal reflux disease); Headache; Heart disease; HLD (hyperlipidemia); HTN (hypertension); Hypertension; Incontinence in female; Murmur; Obesity; Sleep apnea; and Urge incontinence of urine.  PAST SURGICAL HISTORY: She  has a past surgical history that includes Cardiac surgery; Cholecystectomy; Joint replacement; Septoplasty; Bunionectomy; and Breast biopsy (Right, 2004).  Allergies  Allergen Reactions  . Micardis [Telmisartan] Other (See Comments)    Dizziness  . Accupril [Quinapril Hcl] Rash  . Quinapril Rash    No current facility-administered medications on file prior to encounter.    Current Outpatient Prescriptions on File Prior to Encounter  Medication Sig  . aspirin  EC 81 MG tablet Take 81 mg by mouth daily.  . budesonide-formoterol (SYMBICORT) 80-4.5 MCG/ACT inhaler Inhale 2 puffs into the lungs 2 (two) times daily.  . Calcium Polycarbophil (FIBER-LAX PO) Take 1 tablet by mouth daily.  . clopidogrel (PLAVIX) 75 MG tablet Take 75 mg by mouth daily.  . Cyanocobalamin (VITAMIN B-12 PO) Take 1 tablet by mouth daily.  Marland Kitchen glipiZIDE (GLUCOTROL) 5 MG tablet 5 mg 2 (two) times daily.   . Insulin Glargine (LANTUS SOLOSTAR) 100 UNIT/ML Solostar Pen Inject 5 Units into the skin at bedtime.   . meloxicam (MOBIC) 15 MG tablet Take 15 mg by mouth daily.  . mirabegron ER (MYRBETRIQ) 25 MG TB24 tablet Take 1 tablet (25 mg total) by mouth daily.  . Multiple Vitamins-Minerals (OCUVITE PRESERVISION PO) Take 1 capsule by mouth 2 (two) times daily. Reported on 09/09/2015  . ondansetron (ZOFRAN) 4 MG tablet Take 1 tablet (4 mg total) by mouth daily as needed for nausea or vomiting.  . pantoprazole (PROTONIX) 40 MG tablet Take 40 mg by mouth daily.  . potassium chloride SA (K-DUR,KLOR-CON) 20 MEQ tablet Take 20 mEq by mouth 2 (two) times daily.  . pregabalin (LYRICA) 75 MG capsule Take 75 mg by mouth 2 (two) times daily.  . simvastatin (ZOCOR) 40 MG tablet Take 40 mg by mouth daily at 6 PM.  . SitaGLIPtin-MetFORMIN HCl (JANUMET XR) 50-1000 MG TB24 Take 1 tablet by mouth 2 (two) times daily.  . valsartan (DIOVAN) 160 MG tablet Take 160 mg by mouth daily. Reported on 09/09/2015  . Verapamil HCl CR 300 MG CP24 Take 1 capsule by mouth daily.  . Vilazodone HCl (VIIBRYD) 20 MG TABS Take 40 mg by  mouth.   Marland Kitchen HYDROcodone-acetaminophen (NORCO/VICODIN) 5-325 MG tablet Take 1 tablet by mouth once. (Patient not taking: Reported on 09/09/2015)  . sulfamethoxazole-trimethoprim (BACTRIM DS) 800-160 MG per tablet Take 1 tablet by mouth 2 (two) times daily. (Patient not taking: Reported on 09/09/2015)    FAMILY HISTORY:  Her @FAMSTP (<SUBSCRIPT> error)@  SOCIAL HISTORY: She  reports that she has  never smoked. She has never used smokeless tobacco. She reports that she does not drink alcohol or use drugs.  REVIEW OF SYSTEMS:   Unable to obtain as the patient is confused  SUBJECTIVE:  Unable to obtain as the patient is confused VITAL SIGNS: BP (!) 150/85   Pulse (!) 125   Temp (!) 103 F (39.4 C) (Oral)   Resp (!) 22   Ht 5\' 1"  (1.549 m)   Wt 95.3 kg (210 lb)   SpO2 96%   BMI 39.68 kg/m   HEMODYNAMICS:      INTAKE / OUTPUT: No intake/output data recorded.  PHYSICAL EXAMINATION: General:  Elderly white female, found on stretcher, does not appear to be in acute distress Neuro: Confused HEENT:  Atraumatic, normocephalic, no discharge, no JVD Cardiovascular: Tachycardic, regular, no MRG noted   Lungs: Clear bilaterally, no wheezes, crackles, rhonchi noted Abdomen:  soft, nontender, nondistended, active bowel sounds Musculoskeletal:  no inflammation/deformity noted Skin:  grossly intact  LABS:  BMET  Recent Labs Lab 04/14/16 1548  NA 134*  K 4.3  CL 98*  CO2 25  BUN 14  CREATININE 1.09*  GLUCOSE 270*    Electrolytes  Recent Labs Lab 04/14/16 1548  CALCIUM 9.1    CBC  Recent Labs Lab 04/14/16 1548  WBC 11.9*  HGB 13.3  HCT 41.6  PLT 208    Coag's No results for input(s): APTT, INR in the last 168 hours.  Sepsis Markers  Recent Labs Lab 04/14/16 1935  LATICACIDVEN 3.2*    ABG No results for input(s): PHART, PCO2ART, PO2ART in the last 168 hours.  Liver Enzymes  Recent Labs Lab 04/14/16 1548  AST 25  ALT 17  ALKPHOS 75  BILITOT 0.6  ALBUMIN 3.8    Cardiac Enzymes  Recent Labs Lab 04/14/16 1548  TROPONINI <0.03    Glucose No results for input(s): GLUCAP in the last 168 hours.  Imaging Dg Chest 2 View  Result Date: 04/14/2016 CLINICAL DATA:  Shortness of breath and weakness this morning. Cough. EXAM: CHEST  2 VIEW COMPARISON:  01/16/2015; 06/29/2011; 02/17/2011 FINDINGS: Grossly unchanged cardiac silhouette  and mediastinal contours with atherosclerotic plaque within the thoracic aorta. Evaluation of the retrosternal clear space obscured secondary to overlying soft tissues. Left basilar linear heterogeneous opacities are similar to the 06/2011 examination and favored to represent atelectasis or scar. No new focal airspace opacities. No pleural effusion or pneumothorax. No evidence of edema. No acute osseous abnormalities. IMPRESSION: 1. No acute cardiopulmonary disease. Specifically, no evidence of pneumonia. 2.  Aortic Atherosclerosis (ICD10-170.0) Electronically Signed   By: Sandi Mariscal M.D.   On: 04/14/2016 16:25   Ct Angio Chest Pe W And/or Wo Contrast  Result Date: 04/14/2016 CLINICAL DATA:  Shortness of breath and weakness EXAM: CT ANGIOGRAPHY CHEST WITH CONTRAST TECHNIQUE: Multidetector CT imaging of the chest was performed using the standard protocol during bolus administration of intravenous contrast. Multiplanar CT image reconstructions and MIPs were obtained to evaluate the vascular anatomy. CONTRAST:  75 mL Isovue 370 intravenous COMPARISON:  Chest x-ray 04/14/2016 FINDINGS: Cardiovascular: Respiratory motion artifact limits the exam. Suboptimal contrast  opacification of lower order pulmonary artery branches. No gross filling defect within the central or main segmental pulmonary arteries to suggest an embolus. Atherosclerosis of the aorta. No aneurysm. No mediastinal hematoma. There are coronary artery calcifications. The heart appears slightly enlarged. No pericardial effusion. Mediastinum/Nodes: No significantly enlarged mediastinal or hilar lymph nodes. Trachea and mainstem bronchi appear normal. No axillary adenopathy. Image lower thyroid unremarkable. There is a small to moderate hiatal hernia with herniation of proximal stomach into the lower chest. Herniated stomach appears slightly thickened. Lungs/Pleura: No acute consolidation or pleural effusion. No pneumothorax. Upper Abdomen: No acute  abnormality Musculoskeletal: No acute abnormality Review of the MIP images confirms the above findings. IMPRESSION: 1. Examination is limited by respiratory motion artifact. No definite acute pulmonary embolus identified. 2. Small to moderate hiatal hernia with herniation of proximal stomach into the lower chest. Possible wall thickening of the herniated portion of stomach, cannot exclude gastritis or other cause for gastric wall thickening. Endoscopy may be performed as clinically indicated. 3. Coronary artery calcifications. Atherosclerotic vascular disease of the aorta. Electronically Signed   By: Donavan Foil M.D.   On: 04/14/2016 18:39     STUDIES 10/27 CT chest>> Negative for PE  CULTURE 10/27 urine culture>>  ANTIBIOTICS 10/27 Ceftriaxone  SIGNIFICANT EVENTS: 10/27 Patient admitted to [facility] ICU due to severe sepsis possibly related to UTI  LINES/TUBES: None  DISCUSSION: 80 year old female admitted with Nausea, vomiting, tachycardia and altered mental due to  sepsis related to UTI  ASSESSMENT / PLAN:  PULMONARY A: History of sleep apnea P:   Continue to support with oxygen if needed to keep sats > than 92%  CARDIOVASCULAR A:  Hypertension Hx of Hyperlipidemia CAD s/p stent placement P:  Continuous Telemetry Continue aspirin Continue simvastatin Continue Plavix Continue verapamil  RENAL A:   Acute Kidney Injury possibly due to UTI Hyponatremia P:   Continue Ceftriaxone Replace electrolytes per ICU protocol  GASTROINTESTINAL A:  Nausea/Vomitting   Hx of GERD P:  Nothing by mouth  Zofran when necessary Continue famotidine  HEMATOLOGIC A:   History of anemia of chronic disease P:  Heparin for DVT prophylaxis Transfuse if Hgb<7  INFECTIOUS A:   Severe sepsis related to UTI Leukocytosis  Hx of Chronic Cysti Hyperthermia P:   Monitor fever curve Trend lactic acid Pro-calcitonin Tylenol when necessary Follow cultures   ENDOCRINE A:   Hx  of Diabetes Melitus P:   BS every 4 hours SSI coverage  NEUROLOGIC A:  Altered mental status due to severe sepsis  Hx of anxiety P:   Minimize sedating medication Reorient the patient frequently   Bincy Varughese,AG-ACNP Pulmonary and [place]   04/14/2016, 8:26 PM  PCCM ATTENDING ATTESTATION:  I have evaluated patient and reviewed note above by APP Varughese. I have also reviewed database in its entirety and discussed care plan in detail. In addition, this patient was discussed on multidisciplinary rounds.   Important exam findings: Presently cognition is entirely intact NAD Chest clear RRR s M NABS No edema  Major problems addressed by PCCM team: Severe sepsis E coli UTI with bacteremia Delirium, resolved Mild AKI, resolving Hx of recurrent UTIs  PLAN/REC: Transfer to med-surg floor Continue ceftriaxone Begin diet Mobilize Renal US ordered F/U blood culture sensitivities  Possible discharge home 10/29 depending on findings on renal US  If not ready for DC home 10/29 AM, transfer to Hospitalists Will need 7 days antibiotics if no abnormalities on renal US  Merton Border, MD PCCM service Mobile [phone] Pager [phone]

## 2016-04-14 NOTE — ED Triage Notes (Signed)
Pt from [facility] with SOB and weakness since this morning. States she "threw up" when she was coughing and that it made her throat sore. Pt states she was nauseous this morning but now she is only SOB. Pt alert & oriented with NAD noted.

## 2016-04-14 NOTE — ED Notes (Signed)
Pt vomiting again, pt lethargic

## 2016-04-14 NOTE — ED Notes (Signed)
Patient transported to X-ray 

## 2016-04-14 NOTE — ED Notes (Addendum)
PT vomiting large amounts of yellow bile and HR 145, EDP made aware and is at bedside

## 2016-04-15 ENCOUNTER — Inpatient Hospital Stay: Payer: Medicare Other

## 2016-04-15 LAB — BLOOD CULTURE ID PANEL (REFLEXED)
ACINETOBACTER BAUMANNII: NOT DETECTED
CANDIDA ALBICANS: NOT DETECTED
CANDIDA GLABRATA: NOT DETECTED
CANDIDA TROPICALIS: NOT DETECTED
Candida krusei: NOT DETECTED
Candida parapsilosis: NOT DETECTED
Carbapenem resistance: NOT DETECTED
ENTEROBACTER CLOACAE COMPLEX: NOT DETECTED
ENTEROBACTERIACEAE SPECIES: DETECTED — AB
ENTEROCOCCUS SPECIES: NOT DETECTED
ESCHERICHIA COLI: DETECTED — AB
HAEMOPHILUS INFLUENZAE: NOT DETECTED
Klebsiella oxytoca: NOT DETECTED
Klebsiella pneumoniae: NOT DETECTED
LISTERIA MONOCYTOGENES: NOT DETECTED
NEISSERIA MENINGITIDIS: NOT DETECTED
PROTEUS SPECIES: NOT DETECTED
Pseudomonas aeruginosa: NOT DETECTED
STREPTOCOCCUS AGALACTIAE: NOT DETECTED
STREPTOCOCCUS PNEUMONIAE: NOT DETECTED
STREPTOCOCCUS SPECIES: NOT DETECTED
Serratia marcescens: NOT DETECTED
Staphylococcus aureus (BCID): NOT DETECTED
Staphylococcus species: NOT DETECTED
Streptococcus pyogenes: NOT DETECTED

## 2016-04-15 LAB — BASIC METABOLIC PANEL
ANION GAP: 9 (ref 5–15)
BUN: 14 mg/dL (ref 6–20)
CALCIUM: 8.2 mg/dL — AB (ref 8.9–10.3)
CO2: 23 mmol/L (ref 22–32)
Chloride: 104 mmol/L (ref 101–111)
Creatinine, Ser: 0.88 mg/dL (ref 0.44–1.00)
GLUCOSE: 164 mg/dL — AB (ref 65–99)
Potassium: 3.8 mmol/L (ref 3.5–5.1)
Sodium: 136 mmol/L (ref 135–145)

## 2016-04-15 LAB — CBC WITH DIFFERENTIAL/PLATELET
BASOS ABS: 0 10*3/uL (ref 0–0.1)
BASOS PCT: 0 %
EOS PCT: 0 %
Eosinophils Absolute: 0 10*3/uL (ref 0–0.7)
HCT: 38.5 % (ref 35.0–47.0)
Hemoglobin: 12.2 g/dL (ref 12.0–16.0)
Lymphocytes Relative: 4 %
Lymphs Abs: 0.6 10*3/uL — ABNORMAL LOW (ref 1.0–3.6)
MCH: 25.6 pg — ABNORMAL LOW (ref 26.0–34.0)
MCHC: 31.8 g/dL — ABNORMAL LOW (ref 32.0–36.0)
MCV: 80.5 fL (ref 80.0–100.0)
MONO ABS: 0.7 10*3/uL (ref 0.2–0.9)
Monocytes Relative: 5 %
Neutro Abs: 14.5 10*3/uL — ABNORMAL HIGH (ref 1.4–6.5)
Neutrophils Relative %: 91 %
PLATELETS: 167 10*3/uL (ref 150–440)
RBC: 4.79 MIL/uL (ref 3.80–5.20)
RDW: 15.8 % — AB (ref 11.5–14.5)
WBC: 15.8 10*3/uL — ABNORMAL HIGH (ref 3.6–11.0)

## 2016-04-15 LAB — GLUCOSE, CAPILLARY
GLUCOSE-CAPILLARY: 133 mg/dL — AB (ref 65–99)
GLUCOSE-CAPILLARY: 362 mg/dL — AB (ref 65–99)
Glucose-Capillary: 147 mg/dL — ABNORMAL HIGH (ref 65–99)
Glucose-Capillary: 249 mg/dL — ABNORMAL HIGH (ref 65–99)
Glucose-Capillary: 226 mg/dL — ABNORMAL HIGH (ref 65–99)

## 2016-04-15 LAB — PROCALCITONIN: PROCALCITONIN: 37.04 ng/mL

## 2016-04-15 LAB — PHOSPHORUS: PHOSPHORUS: 3.3 mg/dL (ref 2.5–4.6)

## 2016-04-15 LAB — MAGNESIUM: Magnesium: 1.5 mg/dL — ABNORMAL LOW (ref 1.7–2.4)

## 2016-04-15 MED ORDER — GLIPIZIDE 10 MG PO TABS
5.0000 mg | ORAL_TABLET | Freq: Every day | ORAL | Status: DC
  Administered 2016-04-15: 5 mg via ORAL
  Filled 2016-04-15 (×2): qty 1

## 2016-04-15 MED ORDER — MAGNESIUM SULFATE 4 GM/100ML IV SOLN
4.0000 g | Freq: Once | INTRAVENOUS | Status: AC
  Administered 2016-04-15: 4 g via INTRAVENOUS
  Filled 2016-04-15: qty 100

## 2016-04-15 MED ORDER — CARVEDILOL 3.125 MG PO TABS
3.1250 mg | ORAL_TABLET | Freq: Two times a day (BID) | ORAL | Status: DC
  Administered 2016-04-15 – 2016-04-17 (×4): 3.125 mg via ORAL
  Filled 2016-04-15 (×4): qty 1

## 2016-04-15 MED ORDER — INSULIN GLARGINE 100 UNIT/ML ~~LOC~~ SOLN
5.0000 [IU] | Freq: Every day | SUBCUTANEOUS | Status: DC
  Administered 2016-04-15 – 2016-04-16 (×2): 5 [IU] via SUBCUTANEOUS
  Filled 2016-04-15 (×3): qty 0.05

## 2016-04-15 MED ORDER — ENOXAPARIN SODIUM 40 MG/0.4ML ~~LOC~~ SOLN
40.0000 mg | SUBCUTANEOUS | Status: DC
  Administered 2016-04-15 – 2016-04-16 (×2): 40 mg via SUBCUTANEOUS
  Filled 2016-04-15 (×2): qty 0.4

## 2016-04-15 MED ORDER — ATORVASTATIN CALCIUM 20 MG PO TABS
20.0000 mg | ORAL_TABLET | Freq: Every day | ORAL | Status: DC
  Administered 2016-04-15 – 2016-04-16 (×2): 20 mg via ORAL
  Filled 2016-04-15 (×2): qty 1

## 2016-04-15 MED ORDER — NORTRIPTYLINE HCL 10 MG PO CAPS
10.0000 mg | ORAL_CAPSULE | Freq: Every day | ORAL | Status: DC
  Administered 2016-04-15 – 2016-04-16 (×2): 10 mg via ORAL
  Filled 2016-04-15 (×2): qty 1

## 2016-04-15 MED ORDER — GLIPIZIDE 10 MG PO TABS
5.0000 mg | ORAL_TABLET | Freq: Two times a day (BID) | ORAL | Status: DC
  Administered 2016-04-15 – 2016-04-17 (×4): 5 mg via ORAL
  Filled 2016-04-15 (×4): qty 1

## 2016-04-15 MED ORDER — INSULIN ASPART 100 UNIT/ML ~~LOC~~ SOLN
5.0000 [IU] | Freq: Once | SUBCUTANEOUS | Status: AC
  Administered 2016-04-15: 5 [IU] via SUBCUTANEOUS
  Filled 2016-04-15: qty 5

## 2016-04-15 MED ORDER — INSULIN REGULAR HUMAN 100 UNIT/ML IJ SOLN
5.0000 [IU] | Freq: Once | INTRAMUSCULAR | Status: DC
  Filled 2016-04-15: qty 0.05

## 2016-04-15 NOTE — Progress Notes (Signed)
[facility] Progress Note Patient Name: Brandi [name] DOB: 03/04/1936 MRN: [id number]   Date of Service  04/15/2016  HPI/Events of Note  hypomag  eICU Interventions  Mag replaced     Intervention Category Intermediate Interventions: Electrolyte abnormality - evaluation and management  DETERDING,ELIZABETH 04/15/2016, 5:28 AM

## 2016-04-15 NOTE — Discharge Summary (Deleted)
Physician Discharge Summary  Patient ID: Brandi [name] MRN: [id number] DOB/AGE: 1935-08-31 80 y.o.  Admit date: 04/14/2016 Discharge date: 04/16/2016    Discharge Diagnoses:        Obstructive Sleep Apnea       Hypertension       Hyperlipidemia        Acute kidney injury likely secondary to UTI-resolved       Nausea and vomiting-resolved       GERD       Severe Sepsis secondary to UTI with bacteremia and elevated PCT's       Hyperthermia-resolved       Type II Diabetes Mellitus       Altered mental status secondary to severe sepsis-resolved       Anxiety              DISCHARGE PLAN BY DIAGNOSIS    Severe Sepsis secondary to UTI with bacteremia and elevated PCT's Acute kidney injury likely secondary to UTI Plan: Follow-up with PCP Dr. Gilford Rile in 1 to 2 weeks Ciprofloxacin po x 7days Monitor for s/sx of fever Counseled about s/sx of infection and UTI Monitor urinary output If symptoms worsen or persist notify PCP or go to ER  Hyperthermia-resolved Plan: Monitor for s/sx of fever  Nausea and vomiting-resolved GERD Plan: Continue outpatient protonix and zofran Avoid foods that trigger nausea and vomiting  Hypertension Hyperlipidemia Plan: Continue outpatient aspirin, simvastatin, carvedilol, valsartan, and plavix  Obstructive Sleep Apnea Plan: Continue sumbicort Continue CPAP at night Counseled about avoiding sleeping in a supine position Follow good sleep hygiene  Type II Diabetes Mellitus Plan: Continue outpatient glipizide, lantus, janumet, and lantus   Anxiety Plan: Continue outpatient pamelor                 DISCHARGE SUMMARY   Brandi [name] is a 80 y.o. y/o female with a PMH of anemia, anxiety, chronic cystitis, depression, diabetes mellitus, GERD, hyperlipidemia, hypertension, obesity and sleep apnea.  Patient states that she woke up around 4 AM on 10/27 with shortness of breath. She wears CPAP at night. She noted to have a temp of 100.38f  Patient presented to ED with complaints of shortness of breath and feeling nauseous. Her CT chest was negative for pulmonary embolism. When she came back from CT she was confused and was vomiting. She received Benadryl and Zofran. Upon rechecking her temperature it was noted that her temperature was up to 102.52F. Her UA was concerning for severe sepsis. Patient was started on ceftriaxone and PCCM team was notified to admit patient.    SIGNIFICANT DIAGNOSTIC STUDIES 10/27 CT chest>> Negative for PE  SIGNIFICANT EVENTS [facility]27 [facility] to [facility] sepsis possibly related to UTI  MICRO DATA  Blood cultures x2 10/27>>positive for enterobacteriaceae species, ecoli, and gram negative rods Urine 10/27>>  ANTIBIOTICS Ceftriaxone 10/27>>  CONSULTS Intensivist  TUBES / LINES    Discharge Exam: General: alert and oriented, resting in bed, in NAD. Neuro: A&O x 3, non-focal.  HEENT: [place]/AT. PERRL, sclerae anicteric. Cardiovascular: RRR, no M/R/G.  Lungs: Respirations even and unlabored.  CTA bilaterally, No W/R/R.  Abdomen: BS x 4, soft, NT/ND.  Musculoskeletal: No gross deformities, no edema.  Skin: Intact, warm, no rashes.   Vitals:   04/15/16 1300 04/15/16 1537 04/15/16 1923 04/16/16 0335  BP: (!) 122/44 (!) 127/47 (!) 143/55 (!) 129/48  Pulse: 82 76 80 71  Resp: _0 Temp: 98.9 F (37.2  C) 97.8 F (36.6 C) 98.6 F (37 C) 98.2 F (36.8 C)  TempSrc: Oral Oral Oral Oral  SpO2: 100% 100% 92% 95%  Weight:      Height:         Discharge Labs  BMET  Recent Labs Lab 04/14/16 1548 04/15/16 0423 04/16/16 0405  NA 134* 136 134*  K 4.3 3.8 4.3  CL 98* 104 101  CO2 _0 GLUCOSE 270* 164* 297*  BUN 14 14 21*  CREATININE 1.09* 0.88 1.07*  CALCIUM 9.1 8.2* 8.4*  MG  --  1.5*  --   PHOS  --  3.3  --     CBC  Recent Labs Lab 04/14/16 1548 04/15/16 0423 04/16/16 0405  HGB 13.3 12.2 11.7*  HCT 41.6 38.5 35.0  WBC 11.9* 15.8* 11.1*   PLT 208 167 149*    Anti-Coagulation No results for input(s): INR in the last 168 hours.          Medication List    STOP taking these medications   sulfamethoxazole-trimethoprim 800-160 MG tablet Commonly known as:  BACTRIM DS     TAKE these medications   aspirin EC 81 MG tablet Take 81 mg by mouth daily.   budesonide-formoterol 80-4.5 MCG/ACT inhaler Commonly known as:  SYMBICORT Inhale 2 puffs into the lungs 2 (two) times daily.   carvedilol 3.125 MG tablet Commonly known as:  COREG Take 3.125 mg by mouth 2 (two) times daily with a meal.   ciprofloxacin 500 MG tablet Commonly known as:  CIPRO Take 1 tablet (500 mg total) by mouth 2 (two) times daily.   clopidogrel 75 MG tablet Commonly known as:  PLAVIX Take 75 mg by mouth daily.   Cranberry 500 MG Chew Chew 1 tablet by mouth daily.   ESGIC 50-325-40 MG tablet Generic drug:  butalbital-acetaminophen-caffeine Take 1 tablet by mouth at bedtime as needed for headache.   FIBER-LAX PO Take 1 tablet by mouth daily.   glipiZIDE 5 MG tablet Commonly known as:  GLUCOTROL 5 mg 2 (two) times daily.   HYDROcodone-acetaminophen 5-325 MG tablet Commonly known as:  NORCO/VICODIN Take 1 tablet by mouth once.   JANUMET XR 50-1000 MG Tb24 Generic drug:  SitaGLIPtin-MetFORMIN HCl Take 1 tablet by mouth 2 (two) times daily.   LANTUS SOLOSTAR 100 UNIT/ML Solostar Pen Generic drug:  Insulin Glargine Inject 5 Units into the skin at bedtime.   meloxicam 15 MG tablet Commonly known as:  MOBIC Take 15 mg by mouth daily.   mirabegron ER 25 MG Tb24 tablet Commonly known as:  MYRBETRIQ Take 1 tablet (25 mg total) by mouth daily.   nortriptyline 10 MG capsule Commonly known as:  PAMELOR Take 10 mg by mouth at bedtime.   OCUVITE PRESERVISION PO Take 1 capsule by mouth 2 (two) times daily. Reported on 09/09/2015   ondansetron 4 MG tablet Commonly known as:  ZOFRAN Take 1 tablet (4 mg total) by mouth daily as  needed for nausea or vomiting.   pantoprazole 40 MG tablet Commonly known as:  PROTONIX Take 40 mg by mouth daily.   potassium chloride SA 20 MEQ tablet Commonly known as:  K-DUR,KLOR-CON Take 20 mEq by mouth 2 (two) times daily.   pregabalin 75 MG capsule Commonly known as:  LYRICA Take 75 mg by mouth 2 (two) times daily.   simvastatin 40 MG tablet Commonly known as:  ZOCOR Take 40 mg by mouth daily at 6 PM.   valsartan 160 MG  tablet Commonly known as:  DIOVAN Take 160 mg by mouth daily. Reported on 09/09/2015   Verapamil HCl CR 300 MG Cp24 Take 1 capsule by mouth daily.   VIIBRYD 20 MG Tabs Generic drug:  Vilazodone HCl Take 40 mg by mouth.   VITAMIN B-12 PO Take 1 tablet by mouth daily.        Disposition: Pt stable and discharged home  Discharged Condition: Brandi [name] has met maximum benefit of inpatient care and is medically stable and cleared for discharge.  Patient is pending follow up as above.      Marda Stalker, [place] Pager [phone] (please enter 7 digits) PCCM Consult Pager [phone] (please enter 7 digits)

## 2016-04-15 NOTE — Progress Notes (Signed)
Patient was transferred from ICU via ICU bed. Ambulated to bed with assist x1 and WW. A&O x4. Tele placed. Bed alarm in place for safety. Oriented to room and call light. NSL in place to bilat ACs. No c/o pain at this time.

## 2016-04-15 NOTE — Progress Notes (Signed)
Pt continue on IV NS, Now SR on the monitor. Temperature reduced to  97.7. MG low at 1.5, being replaced at this time. No further confusion since admit to ICU, was able to answer all admission questions, and remains alert and oriented.

## 2016-04-15 NOTE — Progress Notes (Signed)
PHARMACY - PHYSICIAN COMMUNICATION CRITICAL VALUE ALERT - BLOOD CULTURE IDENTIFICATION (BCID)  Lab called in positive BCID - 3/4 bottles positive for GNR, growing E.coli.  Per lab, there is currently a delay with lab interface reporting so BCID results unable to be entered into EPIC at this time.  Name of physician (or Provider) Contacted: Dr. Alva Garnet  Changes to prescribed antibiotics required: No changes to current antibiotics. Patient is currently responding to ceftriaxone 2 g IV daily per MD.   Lenis Noon, PharmD Clinical Pharmacist 04/15/2016  10:32 AM

## 2016-04-15 NOTE — Progress Notes (Signed)
Pt moved from room ICU 4 to ICU room 14. Continue to monitor.

## 2016-04-15 NOTE — Progress Notes (Signed)
Pharmacist - Prescriber Communication  Per [facility] protocol, simvastatin 40 mg po daily has been changed to atorvastatin 20 mg po daily to avoid drug-drug interaction with verapamil.   Viha Kriegel A. [place], [place].D., BCPS Clinical Pharmacist 04/15/2016 [phone]

## 2016-04-15 NOTE — Progress Notes (Signed)
PATIENT IS  ALERT AND ORIENTED X4. SYMMETRICAL CHEST MOVEMENT WITH CLEAR LUNG SOUNDS. PATIENT WILL BE MOVED TO ROOM 157 INSTEAD OF ROOM 151 ORIGINALLY. PATIENT WAS MADE AWARE AND PATIENT'S SON WAS ALSO NOTIFIED. PATIENT IS ON ROOM AIR. NS STOPPED AT 1000 PER MD ORDER. BLOOD GLUCOSE WAS 249. MD WAS NOTIFIED AND ORDERED  A ONE TIME 5 UNITS ON INSULIN PATIENT IS ON HEART HEALTHY DIET. ABLE TO FEED SELF AND HAS GOOD APPETITE. WILL CONTINUE TO MONITOR,.

## 2016-04-16 LAB — GLUCOSE, CAPILLARY
GLUCOSE-CAPILLARY: 226 mg/dL — AB (ref 65–99)
Glucose-Capillary: 151 mg/dL — ABNORMAL HIGH (ref 65–99)
Glucose-Capillary: 258 mg/dL — ABNORMAL HIGH (ref 65–99)
Glucose-Capillary: 202 mg/dL — ABNORMAL HIGH (ref 65–99)

## 2016-04-16 LAB — CBC
HCT: 35 % (ref 35.0–47.0)
Hemoglobin: 11.7 g/dL — ABNORMAL LOW (ref 12.0–16.0)
MCH: 26.5 pg (ref 26.0–34.0)
MCHC: 33.6 g/dL (ref 32.0–36.0)
MCV: 79 fL — AB (ref 80.0–100.0)
PLATELETS: 149 10*3/uL — AB (ref 150–440)
RBC: 4.43 MIL/uL (ref 3.80–5.20)
RDW: 16.4 % — AB (ref 11.5–14.5)
WBC: 11.1 10*3/uL — AB (ref 3.6–11.0)

## 2016-04-16 LAB — BASIC METABOLIC PANEL
Anion gap: 6 (ref 5–15)
BUN: 21 mg/dL — AB (ref 6–20)
CALCIUM: 8.4 mg/dL — AB (ref 8.9–10.3)
CHLORIDE: 101 mmol/L (ref 101–111)
CO2: 27 mmol/L (ref 22–32)
CREATININE: 1.07 mg/dL — AB (ref 0.44–1.00)
GFR, EST AFRICAN AMERICAN: 56 mL/min — AB (ref >60)
GFR, EST NON AFRICAN AMERICAN: 48 mL/min — AB (ref >60)
Glucose, Bld: 297 mg/dL — ABNORMAL HIGH (ref 65–99)
Potassium: 4.3 mmol/L (ref 3.5–5.1)
SODIUM: 134 mmol/L — AB (ref 135–145)

## 2016-04-16 LAB — PROCALCITONIN: PROCALCITONIN: 27.89 ng/mL

## 2016-04-16 MED ORDER — POTASSIUM CHLORIDE CRYS ER 20 MEQ PO TBCR
20.0000 meq | EXTENDED_RELEASE_TABLET | Freq: Two times a day (BID) | ORAL | Status: DC
  Administered 2016-04-16 – 2016-04-17 (×3): 20 meq via ORAL
  Filled 2016-04-16 (×3): qty 1

## 2016-04-16 MED ORDER — CRANBERRY 500 MG PO CHEW: 1.0000 | CHEWABLE_TABLET | Freq: Every day | ORAL | Status: DC

## 2016-04-16 MED ORDER — VILAZODONE HCL 20 MG PO TABS: 40.0000 mg | ORAL_TABLET | Freq: Every day | ORAL | Status: DC

## 2016-04-16 MED ORDER — GLIPIZIDE 10 MG PO TABS: 5.0000 mg | ORAL_TABLET | Freq: Every day | ORAL | Status: DC

## 2016-04-16 MED ORDER — CIPROFLOXACIN HCL 500 MG PO TABS: 500.0000 mg | ORAL_TABLET | Freq: Two times a day (BID) | ORAL | 0 refills | Status: DC

## 2016-04-16 MED ORDER — BUTALBITAL-APAP-CAFFEINE 50-325-40 MG PO TABS: 1.0000 | ORAL_TABLET | Freq: Every evening | ORAL | Status: DC | PRN

## 2016-04-16 MED ORDER — VILAZODONE HCL 20 MG PO TABS
40.0000 mg | ORAL_TABLET | Freq: Every day | ORAL | Status: DC
  Administered 2016-04-16: 40 mg via ORAL
  Filled 2016-04-16: qty 2

## 2016-04-16 MED ORDER — ASPIRIN EC 81 MG PO TBEC
81.0000 mg | DELAYED_RELEASE_TABLET | Freq: Every day | ORAL | Status: DC
  Administered 2016-04-17: 81 mg via ORAL
  Filled 2016-04-16: qty 1

## 2016-04-16 MED ORDER — VERAPAMIL HCL ER 300 MG PO CP24: 1.0000 | ORAL_CAPSULE | Freq: Every day | ORAL | Status: DC

## 2016-04-16 MED ORDER — PREGABALIN 75 MG PO CAPS
75.0000 mg | ORAL_CAPSULE | Freq: Two times a day (BID) | ORAL | Status: DC
  Administered 2016-04-16 – 2016-04-17 (×3): 75 mg via ORAL
  Filled 2016-04-16 (×3): qty 1

## 2016-04-16 MED ORDER — MIRABEGRON ER 25 MG PO TB24
25.0000 mg | ORAL_TABLET | Freq: Every day | ORAL | Status: DC
  Administered 2016-04-16 – 2016-04-17 (×2): 25 mg via ORAL
  Filled 2016-04-16 (×2): qty 1

## 2016-04-16 MED ORDER — PANTOPRAZOLE SODIUM 40 MG PO TBEC
40.0000 mg | DELAYED_RELEASE_TABLET | Freq: Every day | ORAL | Status: DC
  Administered 2016-04-16 – 2016-04-17 (×2): 40 mg via ORAL
  Filled 2016-04-16 (×2): qty 1

## 2016-04-16 MED ORDER — INSULIN ASPART 100 UNIT/ML ~~LOC~~ SOLN
0.0000 [IU] | Freq: Three times a day (TID) | SUBCUTANEOUS | Status: DC
  Administered 2016-04-16: 5 [IU] via SUBCUTANEOUS
  Administered 2016-04-16: 8 [IU] via SUBCUTANEOUS
  Administered 2016-04-16 – 2016-04-17 (×2): 3 [IU] via SUBCUTANEOUS
  Filled 2016-04-16 (×2): qty 3
  Filled 2016-04-16 (×2): qty 5

## 2016-04-16 MED ORDER — ZOLPIDEM TARTRATE 5 MG PO TABS
5.0000 mg | ORAL_TABLET | Freq: Every evening | ORAL | Status: DC | PRN
  Administered 2016-04-16: 5 mg via ORAL
  Filled 2016-04-16: qty 1

## 2016-04-16 MED ORDER — CLOPIDOGREL BISULFATE 75 MG PO TABS
75.0000 mg | ORAL_TABLET | Freq: Every day | ORAL | Status: DC
  Administered 2016-04-17: 75 mg via ORAL
  Filled 2016-04-16: qty 1

## 2016-04-16 MED ORDER — INSULIN ASPART 100 UNIT/ML ~~LOC~~ SOLN: 0.0000 [IU] | Freq: Every day | SUBCUTANEOUS | Status: DC

## 2016-04-16 MED ORDER — CLOPIDOGREL BISULFATE 75 MG PO TABS: 75.0000 mg | ORAL_TABLET | Freq: Every day | ORAL | Status: DC

## 2016-04-16 NOTE — Progress Notes (Signed)
PHARMACIST - PHYSICIAN ORDER COMMUNICATION  CONCERNING: P&T Medication Policy on Herbal Medications  DESCRIPTION:  This patient's order for: cranberry 1 tablet daily has been noted.  This product(s) is classified as an "herbal" or natural product. Due to a lack of definitive safety studies or FDA approval, nonstandard manufacturing practices, plus the potential risk of unknown drug-drug interactions while on inpatient medications, the Pharmacy and Therapeutics Committee does not permit the use of "herbal" or natural products of this type within [facility].   ACTION TAKEN: The pharmacy department is unable to verify this order at this time and your patient has been informed of this safety policy. Please reevaluate patient's clinical condition at discharge and address if the herbal or natural product(s) should be resumed at that time.  Darrow Bussing, PharmD Pharmacy Resident 04/16/2016 11:58 AM

## 2016-04-16 NOTE — Progress Notes (Signed)
No new complaints No distress Still with low grade fever  Vitals:   04/15/16 1537 04/15/16 1923 04/16/16 0335 04/16/16 0738  BP: (!) 127/47 (!) 143/55 (!) 129/48 (!) 151/67  Pulse: 76 80 71 86  Resp: 18 18 18 18   Temp: 97.8 F (36.6 C) 98.6 F (37 C) 98.2 F (36.8 C) 99.3 F (37.4 C)  TempSrc: Oral Oral Oral Oral  SpO2: 100% 92% 95% 95%  Weight:      Height:        Gen: WDWN in NAD HEENT: All WNL Neck: NO LAN, no JVD noted Lungs: full BS, no adventitious sounds Cardiovascular: Reg rate, normal rhythm, no M noted Abdomen: Soft, NT +BS Ext: no C/C/E Neuro: CNs intact, motor/sens grossly intact Skin: No lesions noted  BMP Latest Ref Rng & Units 04/16/2016 04/15/2016 04/14/2016  Glucose 65 - 99 mg/dL 297(H) 164(H) 270(H)  BUN 6 - 20 mg/dL 21(H) 14 14  Creatinine 0.44 - 1.00 mg/dL 1.07(H) 0.88 1.09(H)  Sodium 135 - 145 mmol/L 134(L) 136 134(L)  Potassium 3.5 - 5.1 mmol/L 4.3 3.8 4.3  Chloride 101 - 111 mmol/L 101 104 98(L)  CO2 22 - 32 mmol/L 27 23 25   Calcium 8.9 - 10.3 mg/dL 8.4(L) 8.2(L) 9.1   CBC Latest Ref Rng & Units 04/16/2016 04/15/2016 04/14/2016  WBC 3.6 - 11.0 K/uL 11.1(H) 15.8(H) 11.9(H)  Hemoglobin 12.0 - 16.0 g/dL 11.7(L) 12.2 13.3  Hematocrit 35.0 - 47.0 % 35.0 38.5 41.6  Platelets 150 - 440 K/uL 149(L) 167 208    E coli in blood and urine  No new CXR  Renal US: 2.0 cm right renal simple cyst.  Otherwise, normal examination2.0 cm right renal simple cyst. Otherwise, normal examination  IMPRESSION: Recurrent UTIs, unclear reason Severe sepsis due to UTI E coli bacteremia Elevated PCT and low grade fevers persist - she is not yet ready for transition to PO abx DM 2 - poorly controlled presently  She is responding clinically to ceftriaxone so I assume that the E coli is sensitive She will need outpt eval for recurrent UTIs  PLAN/REC: I have spoken with Dr Darvin Neighbours. [facility] will assume her care as primary service Continue  ceftriaxone Complete 7-10 days abx Hgb A1C has been sent - F/U results Adjustments in DM regimen per Hospitalists Will need outpt eval for recurrent UTIs by Dr Gilford Rile  PCCM will sign off. Please call if we can be of further assistance  Merton Border, MD PCCM service Mobile [phone] Pager [phone] 04/16/2016

## 2016-04-16 NOTE — Plan of Care (Signed)
Problem: Safety: Goal: Ability to remain free from injury will improve Outcome: Progressing No self harm reported or observed   

## 2016-04-16 NOTE — Progress Notes (Signed)
[facility] [place] NAME: Brandi [name]    MR#:  [id number]  DATE OF BIRTH:  [birth year]  SUBJECTIVE:  CHIEF COMPLAINT:   Chief Complaint  Patient presents with  . Shortness of Breath   Patient presented with shortness of breath and sepsis. Admitted to ICU. Transferred out of ICU. Discussed with Dr. Alva Garnet  REVIEW OF SYSTEMS:    Review of Systems  Constitutional: Positive for malaise/fatigue. Negative for chills and fever.  HENT: Negative for sore throat.   Eyes: Negative for blurred vision, double vision and pain.  Respiratory: Negative for cough, hemoptysis, shortness of breath and wheezing.   Cardiovascular: Negative for chest pain, palpitations, orthopnea and leg swelling.  Gastrointestinal: Negative for abdominal pain, constipation, diarrhea, heartburn, nausea and vomiting.  Genitourinary: Negative for dysuria and hematuria.  Musculoskeletal: Negative for back pain and joint pain.  Skin: Negative for rash.  Neurological: Positive for weakness. Negative for sensory change, speech change, focal weakness and headaches.  Endo/Heme/Allergies: Does not bruise/bleed easily.  Psychiatric/Behavioral: Negative for depression. The patient is not nervous/anxious.     DRUG ALLERGIES:   Allergies  Allergen Reactions  . Micardis [Telmisartan] Other (See Comments)    Dizziness  . Accupril [Quinapril Hcl] Rash  . Quinapril Rash    VITALS:  Blood pressure (!) 157/68, pulse 85, temperature 98 F (36.7 C), temperature source Oral, resp. rate 18, height 5\' 1"  (1.549 m), weight 95.2 kg (209 lb 14.1 oz), SpO2 95 %.  PHYSICAL EXAMINATION:   Physical Exam  GENERAL:  80 y.o.-year-old patient lying in the bed with no acute distress.  EYES: Pupils equal, round, reactive to light and accommodation. No scleral icterus. Extraocular muscles intact.  HEENT: Head atraumatic, normocephalic. Oropharynx and nasopharynx clear.  NECK:  Supple, no jugular  venous distention. No thyroid enlargement, no tenderness.  LUNGS: Normal breath sounds bilaterally, no wheezing, rales, rhonchi. No use of accessory muscles of respiration.  CARDIOVASCULAR: S1, S2 normal. No murmurs, rubs, or gallops.  ABDOMEN: Soft, nontender, nondistended. Bowel sounds present. No organomegaly or mass.  EXTREMITIES: No cyanosis, clubbing or edema b/l.    NEUROLOGIC: Cranial nerves II through XII are intact. No focal Motor or sensory deficits b/l.   PSYCHIATRIC: The patient is alert and oriented x 3.  SKIN: No obvious rash, lesion, or ulcer.   LABORATORY PANEL:   CBC  Recent Labs Lab 04/16/16 0405  WBC 11.1*  HGB 11.7*  HCT 35.0  PLT 149*   ------------------------------------------------------------------------------------------------------------------ Chemistries   Recent Labs Lab 04/14/16 1548 04/15/16 0423 04/16/16 0405  NA 134* 136 134*  K 4.3 3.8 4.3  CL 98* 104 101  CO2 25 23 27   GLUCOSE 270* 164* 297*  BUN 14 14 21*  CREATININE 1.09* 0.88 1.07*  CALCIUM 9.1 8.2* 8.4*  MG  --  1.5*  --   AST 25  --   --   ALT 17  --   --   ALKPHOS 75  --   --   BILITOT 0.6  --   --    ------------------------------------------------------------------------------------------------------------------  Cardiac Enzymes  Recent Labs Lab 04/14/16 1548  TROPONINI <0.03   ------------------------------------------------------------------------------------------------------------------  RADIOLOGY:  Dg Chest 2 View  Result Date: 04/14/2016 CLINICAL DATA:  Shortness of breath and weakness this morning. Cough. EXAM: CHEST  2 VIEW COMPARISON:  01/16/2015; 06/29/2011; 02/17/2011 FINDINGS: Grossly unchanged cardiac silhouette and mediastinal contours with atherosclerotic plaque within the thoracic aorta. Evaluation of the retrosternal clear space obscured  secondary to overlying soft tissues. Left basilar linear heterogeneous opacities are similar to the 06/2011  examination and favored to represent atelectasis or scar. No new focal airspace opacities. No pleural effusion or pneumothorax. No evidence of edema. No acute osseous abnormalities. IMPRESSION: 1. No acute cardiopulmonary disease. Specifically, no evidence of pneumonia. 2.  Aortic Atherosclerosis (ICD10-170.0) Electronically Signed   By: Sandi Mariscal M.D.   On: 04/14/2016 16:25   Ct Angio Chest Pe W And/or Wo Contrast  Result Date: 04/14/2016 CLINICAL DATA:  Shortness of breath and weakness EXAM: CT ANGIOGRAPHY CHEST WITH CONTRAST TECHNIQUE: Multidetector CT imaging of the chest was performed using the standard protocol during bolus administration of intravenous contrast. Multiplanar CT image reconstructions and MIPs were obtained to evaluate the vascular anatomy. CONTRAST:  75 mL Isovue 370 intravenous COMPARISON:  Chest x-ray 04/14/2016 FINDINGS: Cardiovascular: Respiratory motion artifact limits the exam. Suboptimal contrast opacification of lower order pulmonary artery branches. No gross filling defect within the central or main segmental pulmonary arteries to suggest an embolus. Atherosclerosis of the aorta. No aneurysm. No mediastinal hematoma. There are coronary artery calcifications. The heart appears slightly enlarged. No pericardial effusion. Mediastinum/Nodes: No significantly enlarged mediastinal or hilar lymph nodes. Trachea and mainstem bronchi appear normal. No axillary adenopathy. Image lower thyroid unremarkable. There is a small to moderate hiatal hernia with herniation of proximal stomach into the lower chest. Herniated stomach appears slightly thickened. Lungs/Pleura: No acute consolidation or pleural effusion. No pneumothorax. Upper Abdomen: No acute abnormality Musculoskeletal: No acute abnormality Review of the MIP images confirms the above findings. IMPRESSION: 1. Examination is limited by respiratory motion artifact. No definite acute pulmonary embolus identified. 2. Small to moderate  hiatal hernia with herniation of proximal stomach into the lower chest. Possible wall thickening of the herniated portion of stomach, cannot exclude gastritis or other cause for gastric wall thickening. Endoscopy may be performed as clinically indicated. 3. Coronary artery calcifications. Atherosclerotic vascular disease of the aorta. Electronically Signed   By: Donavan Foil M.D.   On: 04/14/2016 18:39   US Renal  Result Date: 04/15/2016 CLINICAL DATA:  Recurrent urinary tract infections. EXAM: RENAL / URINARY TRACT ULTRASOUND COMPLETE COMPARISON:  None. FINDINGS: Right Kidney: Length: 10.9 cm. Normal echogenicity. No hydronephrosis. 2.0 cm mid right renal cyst. Left Kidney: Length: 9.4 cm. Echogenicity within normal limits. No mass or hydronephrosis visualized. Bladder: Appears normal for degree of bladder distention. IMPRESSION: 2.0 cm right renal simple cyst.  Otherwise, normal examination. Electronically Signed   By: Claudie Revering M.D.   On: 04/15/2016 15:18     ASSESSMENT AND PLAN:   * E coli bacteremia secondary to UTI Ultrasound renal showed no obstruction or hydronephrosis. Improving well with IV ceftriaxone. Continue IV antibiotics.  * Sepsis present on admission is improving.  * Diabetes mellitus, uncontrolled Increase glipizide to 2 times a day which is her home dose. Sliding scale insulin.  * Hypertension Continue medications.  * DVT prophylaxis with Lovenox   All the records are reviewed and case discussed with Care Management/Social Workerr. Management plans discussed with the patient, family and they are in agreement.  CODE STATUS: FULL CODE  TOTAL TIME TAKING CARE OF THIS PATIENT: 40 minutes.   POSSIBLE D/C IN 1-2 DAYS, DEPENDING ON CLINICAL CONDITION.  Hillary Bow R M.D on 04/16/2016 at 11:47 AM  Between 7am to 6pm - Pager - [phone]  After 6pm go to [email or web address] - password [email or web address] [place] [facility]  Office  [phone]  CC:  Primary  care physician; Madelyn Brunner, MD  Note: This dictation was prepared with Dragon dictation along with smaller phrase technology. Any transcriptional errors that result from this process are unintentional.

## 2016-04-16 NOTE — Care Management Important Message (Signed)
Important Message  Patient Details  Name: MARVETTE FIGUERO MRN: [id number] Date of Birth: 1936/03/05   Medicare Important Message Given:  Yes    Lissete Maestas A, RN 04/16/2016, 3:08 PM

## 2016-04-16 NOTE — Progress Notes (Signed)
Pt had episode of shortness of breath, relieved after standing and oxygen for comfort. Telemetry called, reports no P wave. Writer notified attending doctor. EKG ordered and completed. Pt currently without complaints.

## 2016-04-17 LAB — CULTURE, BLOOD (ROUTINE X 2)

## 2016-04-17 LAB — HEMOGLOBIN A1C
Hgb A1c MFr Bld: 7.3 % — ABNORMAL HIGH (ref 4.8–5.6)
Mean Plasma Glucose: 163 mg/dL

## 2016-04-17 LAB — URINE CULTURE: Culture: >=100000 — AB

## 2016-04-17 LAB — GLUCOSE, CAPILLARY: Glucose-Capillary: 189 mg/dL — ABNORMAL HIGH (ref 65–99)

## 2016-04-17 MED ORDER — CIPROFLOXACIN HCL 500 MG PO TABS: 500.0000 mg | ORAL_TABLET | Freq: Two times a day (BID) | ORAL | 0 refills | Status: DC

## 2016-04-17 MED ORDER — PREDNISONE 50 MG PO TABS
50.0000 mg | ORAL_TABLET | Freq: Once | ORAL | Status: AC
  Administered 2016-04-17: 50 mg via ORAL
  Filled 2016-04-17: qty 1

## 2016-04-17 MED ORDER — PREDNISONE 20 MG PO TABS: 40.0000 mg | ORAL_TABLET | Freq: Every day | ORAL | 0 refills | Status: DC

## 2016-04-17 NOTE — Progress Notes (Signed)
Pt being discharged home today, PIV removed. Discharge instructions reviewed with pt, all questions answered. Prescriptions were sent to her pharmacy for pick up. Will follow up appointment with PCP in 2 weeks has been made. She is leaving with all her belongings, will be transported home via family member.

## 2016-04-17 NOTE — Discharge Instructions (Signed)
Resume diet and activity as before ° ° °

## 2016-04-17 NOTE — Progress Notes (Addendum)
Inpatient Diabetes Program Recommendations  AACE/ADA: New Consensus Statement on Inpatient Glycemic Control (2015)  Target Ranges:  Prepandial:   less than 140 mg/dL      Peak postprandial:   less than 180 mg/dL (1-2 hours)      Critically ill patients:  140 - 180 mg/dL   Lab Results  Component Value Date   GLUCAP 189 (H) 04/17/2016   HGBA1C 7.3 (H) 04/16/2016    Review of Glycemic Control:   Results for CYNDLE, BASTEDO (MRN [id number]) as of 04/17/2016 10:28  Ref. Range 04/16/2016 07:39 04/16/2016 11:22 04/16/2016 16:33 04/16/2016 21:13 04/17/2016 07:42  Glucose-Capillary Latest Ref Range: 65 - 99 mg/dL 226 (H) 151 (H) 258 (H) 202 (H) 189 (H)   Diabetes history: Type 2 diabetes Outpatient Diabetes medications: Glipizide 5 mg bid, Lantus 5 units q HS, Janumet XR 50-1000 bid Current orders for Inpatient glycemic control:  Lantus 5 units q HS, Novolog moderate tid with meals and HS, Glipizide 5 mg bid  Inpatient Diabetes Program Recommendations:   May consider increasing Lantus to 12 units daily. Also consider holding Glipizide while patient is in the hospital.  Thanks, Adah Perl, RN, BC-ADM Inpatient Diabetes Coordinator Pager [phone] (8a-5p)

## 2016-04-20 NOTE — Discharge Summary (Signed)
[facility] [place] NAME: Brandi [name]    MR#:  [id number]  DATE OF BIRTH:  10-28-1935  DATE OF ADMISSION:  04/14/2016 ADMITTING PHYSICIAN: Wilhelmina Mcardle, MD  DATE OF DISCHARGE: 04/17/2016 12:52 PM  PRIMARY CARE PHYSICIAN: Madelyn Brunner, MD   ADMISSION DIAGNOSIS:  Sepsis, due to unspecified organism ([place]) [A41.9]  DISCHARGE DIAGNOSIS:  Active Problems:   Severe sepsis ([place])   SECONDARY DIAGNOSIS:   Past Medical History:  Diagnosis Date  . Anemia   . Anxiety   . Arthritis, degenerative   . Chronic cystitis   . Depression   . Diabetes mellitus without complication ([place])   . GERD (gastroesophageal reflux disease)   . Headache   . Heart disease   . HLD (hyperlipidemia)   . HTN (hypertension)   . Hypertension   . Incontinence in female   . Murmur   . Obesity   . Sleep apnea   . Urge incontinence of urine      ADMITTING HISTORY  HISTORY OF PRESENT ILLNESS:   Brandi [name] is a 80 year old female with past medical history significant for anemia, anxiety, chronic cystitis, depression, diabetes mellitus, GERD, hyperlipidemia, hypertension, obesity and sleep apnea. Patient states that she woke up around 4 AM on 10/27 with shortness of breath. She wears CPAP at night. She noted to have a temp of 100.89f. Patient presented to ED with complaints of shortness of breath and feeling nauseous. Her CT chest was negative for pulmonary embolism. When she came back from CT she was confused and was vomiting. She received Benadryl and Zofran. Upon rechecking her temperature it was noted that her temperature was up to 102.47F. Her UA was concerning for severe sepsis. Patient was started on ceftriaxone and [facility] team was called to admit the patient.  HOSPITAL COURSE:    * E coli bacteremia secondary to UTI With sepsis Ultrasound renal showed no obstruction or hydronephrosis. Improved well with IV ceftriaxone. Initially under care off  pulmonary team and ICU and later transferred to floor. Escherichia coli is pansensitive and patient will be discharged home on ciprofloxacin to finish 14 day course.  * Acute bronchitis Patient is a prednisone the hospitalization be continued after discharge. She has inhalers and nebulizers at home.  * Sepsis present on admission has resolved.  * Diabetes mellitus, uncontrolled Initially uncontrolled due to sepsis. Now being started on prednisone. Short course. She returned to normal once steroid stop.  * Hypertension Continue medications.  * DVT prophylaxis with Lovenox in the hospital  Stable for discharge home on ciprofloxacin.  CONSULTS OBTAINED:    DRUG ALLERGIES:   Allergies  Allergen Reactions  . Micardis [Telmisartan] Other (See Comments)    Dizziness  . Accupril [Quinapril Hcl] Rash  . Quinapril Rash    DISCHARGE MEDICATIONS:   Discharge Medication List as of 04/17/2016 11:08 AM    START taking these medications   Details  predniSONE (DELTASONE) 20 MG tablet Take 2 tablets (40 mg total) by mouth daily with breakfast., Starting Mon 04/17/2016, Normal      CONTINUE these medications which have CHANGED   Details  ciprofloxacin (CIPRO) 500 MG tablet Take 1 tablet (500 mg total) by mouth 2 (two) times daily., Starting Mon 04/17/2016, Normal      CONTINUE these medications which have NOT CHANGED   Details  aspirin EC 81 MG tablet Take 81 mg by mouth daily., Historical Med    budesonide-formoterol (  SYMBICORT) 80-4.5 MCG/ACT inhaler Inhale 2 puffs into the lungs 2 (two) times daily., Historical Med    butalbital-acetaminophen-caffeine (ESGIC) 50-325-40 MG tablet Take 1 tablet by mouth at bedtime as needed for headache., Historical Med    Calcium Polycarbophil (FIBER-LAX PO) Take 1 tablet by mouth daily., Historical Med    carvedilol (COREG) 3.125 MG tablet Take 3.125 mg by mouth 2 (two) times daily with a meal., Historical Med    clopidogrel (PLAVIX) 75 MG  tablet Take 75 mg by mouth daily., Historical Med    Cranberry 500 MG CHEW Chew 1 tablet by mouth daily., Historical Med    Cyanocobalamin (VITAMIN B-12 PO) Take 1 tablet by mouth daily., Historical Med    glipiZIDE (GLUCOTROL) 5 MG tablet 5 mg 2 (two) times daily. , Starting Tue 08/19/2013, Historical Med    Insulin Glargine (LANTUS SOLOSTAR) 100 UNIT/ML Solostar Pen Inject 5 Units into the skin at bedtime. , Starting Tue 07/20/2015, Historical Med    meloxicam (MOBIC) 15 MG tablet Take 15 mg by mouth daily., Historical Med    mirabegron ER (MYRBETRIQ) 25 MG TB24 tablet Take 1 tablet (25 mg total) by mouth daily., Starting Thu 09/09/2015, Normal    Multiple Vitamins-Minerals (OCUVITE PRESERVISION PO) Take 1 capsule by mouth 2 (two) times daily. Reported on 09/09/2015, Historical Med    nortriptyline (PAMELOR) 10 MG capsule Take 10 mg by mouth at bedtime., Historical Med    ondansetron (ZOFRAN) 4 MG tablet Take 1 tablet (4 mg total) by mouth daily as needed for nausea or vomiting., Starting Sun 11/21/2015, Print    pantoprazole (PROTONIX) 40 MG tablet Take 40 mg by mouth daily., Historical Med    potassium chloride SA (K-DUR,KLOR-CON) 20 MEQ tablet Take 20 mEq by mouth 2 (two) times daily., Historical Med    pregabalin (LYRICA) 75 MG capsule Take 75 mg by mouth 2 (two) times daily., Historical Med    simvastatin (ZOCOR) 40 MG tablet Take 40 mg by mouth daily at 6 PM., Historical Med    SitaGLIPtin-MetFORMIN HCl (JANUMET XR) 50-1000 MG TB24 Take 1 tablet by mouth 2 (two) times daily., Starting Wed 12/02/2014, Historical Med    valsartan (DIOVAN) 160 MG tablet Take 160 mg by mouth daily. Reported on 09/09/2015, Historical Med    Verapamil HCl CR 300 MG CP24 Take 1 capsule by mouth daily., Historical Med    Vilazodone HCl (VIIBRYD) 20 MG TABS Take 40 mg by mouth. , Starting Mon 03/11/2012, Historical Med    HYDROcodone-acetaminophen (NORCO/VICODIN) 5-325 MG tablet Take 1 tablet by mouth once.,  Starting Mon 04/26/2015, Print      STOP taking these medications     sulfamethoxazole-trimethoprim (BACTRIM DS) 800-160 MG per tablet         Today   VITAL SIGNS:  Blood pressure (!) 186/87, pulse (!) 101, temperature 98.4 F (36.9 C), resp. rate 18, height 5\' 1"  (1.549 m), weight 95.2 kg (209 lb 14.1 oz), SpO2 97 %.  I/O:  No intake or output data in the 24 hours ending 04/20/16 1615  PHYSICAL EXAMINATION:  Physical Exam  GENERAL:  80 y.o.-year-old patient lying in the bed with no acute distress.  LUNGS: No rales,rhonchi or crepitation. No use of accessory muscles of respiration. Mild expiratory wheezing CARDIOVASCULAR: S1, S2 normal. No murmurs, rubs, or gallops.  ABDOMEN: Soft, non-tender, non-distended. Bowel sounds present. No organomegaly or mass.  NEUROLOGIC: Moves all 4 extremities. PSYCHIATRIC: The patient is alert and oriented x 3.  SKIN: No obvious  rash, lesion, or ulcer.   DATA REVIEW:   CBC  Recent Labs Lab 04/16/16 0405  WBC 11.1*  HGB 11.7*  HCT 35.0  PLT 149*    Chemistries   Recent Labs Lab 04/14/16 1548 04/15/16 0423 04/16/16 0405  NA 134* 136 134*  K 4.3 3.8 4.3  CL 98* 104 101  CO2 25 23 27   GLUCOSE 270* 164* 297*  BUN 14 14 21*  CREATININE 1.09* 0.88 1.07*  CALCIUM 9.1 8.2* 8.4*  MG  --  1.5*  --   AST 25  --   --   ALT 17  --   --   ALKPHOS 75  --   --   BILITOT 0.6  --   --     Cardiac Enzymes  Recent Labs Lab 04/14/16 1548  TROPONINI <0.03    Microbiology Results  Results for orders placed or performed during the hospital encounter of 04/14/16  Urine culture     Status: Abnormal   Collection Time: 04/14/16  3:48 PM  Result Value Ref Range Status   Specimen Description URINE, RANDOM  Final   Special Requests NONE  Final   Culture >=100,000 COLONIES/mL ESCHERICHIA COLI (A)  Final   Report Status 04/17/2016 FINAL  Final   Organism ID, Bacteria ESCHERICHIA COLI (A)  Final      Susceptibility   Escherichia coli -  MIC*    AMPICILLIN <=2 SENSITIVE Sensitive     CEFAZOLIN <=4 SENSITIVE Sensitive     CEFTRIAXONE <=1 SENSITIVE Sensitive     CIPROFLOXACIN <=0.25 SENSITIVE Sensitive     GENTAMICIN <=1 SENSITIVE Sensitive     IMIPENEM <=0.25 SENSITIVE Sensitive     NITROFURANTOIN <=16 SENSITIVE Sensitive     TRIMETH/SULFA <=20 SENSITIVE Sensitive     AMPICILLIN/SULBACTAM <=2 SENSITIVE Sensitive     PIP/TAZO <=4 SENSITIVE Sensitive     Extended ESBL NEGATIVE Sensitive     * >=100,000 COLONIES/mL ESCHERICHIA COLI  Blood Culture (routine x 2)     Status: Abnormal   Collection Time: 04/14/16  7:35 PM  Result Value Ref Range Status   Specimen Description BLOOD RIGHT ASSIST CONTROL  Final   Special Requests BOTTLES DRAWN AEROBIC AND ANAEROBIC 5CC  Final   Culture  Setup Time   Final    Organism ID to follow GRAM NEGATIVE RODS IN BOTH AEROBIC AND ANAEROBIC BOTTLES CRITICAL RESULT CALLED TO, READ BACK BY AND VERIFIED WITH: Theodis Shove @ 04/15/16 by [facility]    Culture ESCHERICHIA COLI (A)  Final   Report Status 04/17/2016 FINAL  Final   Organism ID, Bacteria ESCHERICHIA COLI  Final      Susceptibility   Escherichia coli - MIC*    AMPICILLIN <=2 SENSITIVE Sensitive     CEFAZOLIN <=4 SENSITIVE Sensitive     CEFEPIME <=1 SENSITIVE Sensitive     CEFTAZIDIME <=1 SENSITIVE Sensitive     CEFTRIAXONE <=1 SENSITIVE Sensitive     CIPROFLOXACIN <=0.25 SENSITIVE Sensitive     GENTAMICIN <=1 SENSITIVE Sensitive     IMIPENEM <=0.25 SENSITIVE Sensitive     TRIMETH/SULFA <=20 SENSITIVE Sensitive     AMPICILLIN/SULBACTAM <=2 SENSITIVE Sensitive     PIP/TAZO <=4 SENSITIVE Sensitive     Extended ESBL NEGATIVE Sensitive     * ESCHERICHIA COLI  Blood Culture (routine x 2)     Status: Abnormal   Collection Time: 04/14/16  7:35 PM  Result Value Ref Range Status   Specimen Description BLOOD LEFT ASSIST CONTROL  Final   Special Requests BOTTLES DRAWN AEROBIC AND ANAEROBIC 5CC  Final   Culture  Setup Time   Final    GRAM  NEGATIVE RODS IN BOTH AEROBIC AND ANAEROBIC BOTTLES CRITICAL VALUE NOTED.  VALUE IS CONSISTENT WITH PREVIOUSLY REPORTED AND CALLED VALUE.    Culture (A)  Final    ESCHERICHIA COLI SUSCEPTIBILITIES PERFORMED ON PREVIOUS CULTURE WITHIN THE LAST 5 DAYS. Performed at [facility]    Report Status 04/17/2016 FINAL  Final  Blood Culture ID Panel (Reflexed)     Status: Abnormal   Collection Time: 04/14/16  7:35 PM  Result Value Ref Range Status   Enterococcus species NOT DETECTED NOT DETECTED Final   Listeria monocytogenes NOT DETECTED NOT DETECTED Final   Staphylococcus species NOT DETECTED NOT DETECTED Final   Staphylococcus aureus NOT DETECTED NOT DETECTED Final   Streptococcus species NOT DETECTED NOT DETECTED Final   Streptococcus agalactiae NOT DETECTED NOT DETECTED Final   Streptococcus pneumoniae NOT DETECTED NOT DETECTED Final   Streptococcus pyogenes NOT DETECTED NOT DETECTED Final   Acinetobacter baumannii NOT DETECTED NOT DETECTED Final   Enterobacteriaceae species DETECTED (A) NOT DETECTED Final    Comment: CRITICAL RESULT CALLED TO, READ BACK BY AND VERIFIED WITH: Theodis Shove @ 1024 04/15/16 by [place]    Enterobacter cloacae complex NOT DETECTED NOT DETECTED Final   Escherichia coli DETECTED (A) NOT DETECTED Corrected    Comment: CRITICAL RESULT CALLED TO, READ BACK BY AND VERIFIED WITH: Theodis Shove @ 1024 04/15/16 by [place] ON 10/28 AT [id number]: PREVIOUSLY REPORTED AS DETECTED    Klebsiella oxytoca NOT DETECTED NOT DETECTED Corrected    Comment: CORRECTED ON 10/28 AT 1359: PREVIOUSLY REPORTED AS NOT DETECTED CRITICAL RESULT CALLED TO, READ BACK BY AND VERIFIED WITH: Theodis Shove @ 1024 04/15/16 by [place]   Klebsiella pneumoniae NOT DETECTED NOT DETECTED Final   Proteus species NOT DETECTED NOT DETECTED Final   Serratia marcescens NOT DETECTED NOT DETECTED Final   Carbapenem resistance NOT DETECTED NOT DETECTED Final   Haemophilus influenzae NOT DETECTED NOT DETECTED  Final   Neisseria meningitidis NOT DETECTED NOT DETECTED Final   Pseudomonas aeruginosa NOT DETECTED NOT DETECTED Final   Candida albicans NOT DETECTED NOT DETECTED Final   Candida glabrata NOT DETECTED NOT DETECTED Final   Candida krusei NOT DETECTED NOT DETECTED Final   Candida parapsilosis NOT DETECTED NOT DETECTED Final   Candida tropicalis NOT DETECTED NOT DETECTED Final  MRSA PCR Screening     Status: None   Collection Time: 04/14/16 10:19 PM  Result Value Ref Range Status   MRSA by PCR NEGATIVE NEGATIVE Final    Comment:        The GeneXpert MRSA Assay (FDA approved for NASAL specimens only), is one component of a comprehensive MRSA colonization surveillance program. It is not intended to diagnose MRSA infection nor to guide or monitor treatment for MRSA infections.     RADIOLOGY:  No results found.  Follow up with PCP in 1 week.  Management plans discussed with the patient, family and they are in agreement.  CODE STATUS:  Code Status History    Date Active Date Inactive Code Status Order ID Comments User Context   04/14/2016  8:13 PM 04/17/2016  3:52 PM Full Code [id number]  Holley Raring, NP ED    Advance Directive Documentation   Flowsheet Row Most Recent Value  Type of Advance Directive  Healthcare Power of Attorney  Pre-existing out of facility DNR order (  yellow form or pink MOST form)  No data  "MOST" Form in Place?  No data      TOTAL TIME TAKING CARE OF THIS PATIENT ON DAY OF DISCHARGE: more than 30 minutes.   Hillary Bow R M.D on 04/20/2016 at 4:15 PM  Between 7am to 6pm - Pager - [phone]  After 6pm go to [email or web address] - password [email or web address] [facility]  Office  [phone]  CC: Primary care physician; Madelyn Brunner, MD  Note: This dictation was prepared with Dragon dictation along with smaller phrase technology. Any transcriptional errors that result from this process are unintentional.

## 2016-05-04 ENCOUNTER — Ambulatory Visit: Payer: Medicare Other | Admitting: Urology

## 2016-05-04 ENCOUNTER — Encounter: Payer: Self-pay | Admitting: Urology

## 2016-05-04 VITALS — BP 149/79 | HR 88 | Ht 61.0 in | Wt 205.1 lb

## 2016-05-04 DIAGNOSIS — R351 Nocturia: Secondary | ICD-10-CM | POA: Diagnosis not present

## 2016-05-04 DIAGNOSIS — N3946 Mixed incontinence: Secondary | ICD-10-CM | POA: Diagnosis not present

## 2016-05-04 DIAGNOSIS — N952 Postmenopausal atrophic vaginitis: Secondary | ICD-10-CM

## 2016-05-04 DIAGNOSIS — N390 Urinary tract infection, site not specified: Secondary | ICD-10-CM

## 2016-05-04 DIAGNOSIS — N8110 Cystocele, unspecified: Secondary | ICD-10-CM | POA: Diagnosis not present

## 2016-05-04 LAB — URINALYSIS, COMPLETE
Bilirubin, UA: NEGATIVE
GLUCOSE, UA: NEGATIVE
Ketones, UA: NEGATIVE
LEUKOCYTES UA: NEGATIVE
Nitrite, UA: NEGATIVE
PROTEIN UA: NEGATIVE
Specific Gravity, UA: 1.015 (ref 1.005–1.030)
UUROB: 0.2 mg/dL (ref 0.2–1.0)
pH, UA: 6 (ref 5.0–7.5)

## 2016-05-04 LAB — MICROSCOPIC EXAMINATION: BACTERIA UA: NONE SEEN

## 2016-05-04 MED ORDER — MIRABEGRON ER 25 MG PO TB24: 25.0000 mg | ORAL_TABLET | Freq: Every day | ORAL | 4 refills | Status: DC

## 2016-05-04 MED ORDER — ESTROGENS, CONJUGATED 0.625 MG/GM VA CREA: 1.0000 | TOPICAL_CREAM | Freq: Every day | VAGINAL | 12 refills | Status: DC

## 2016-05-04 MED ORDER — ESTRADIOL 0.1 MG/GM VA CREA: TOPICAL_CREAM | VAGINAL | 12 refills | Status: DC

## 2016-05-04 NOTE — Progress Notes (Signed)
05/04/2016 8:59 PM   Brandi [name] [birth year] [id number]  Referring provider: Madelyn Brunner, MD [facility] [place], [place] [address]  Chief Complaint  Patient presents with  . Follow-up    patient seen at ER for uti    HPI: Patient is 80 year old Caucasian female who presents today for follow up after hospitalization for E. Coli bacteremia secondary to UTI with sepsis.    Patient was admitted to the hospital to weeks ago after experiencing shortness of breath, high fevers, vomiting and mental confusion.  She spent 3 days in the hospital and was discharged on ciprofloxacin.  Today, she is not experiencing any UTI symptoms.  She specifically denies dysuria, gross hematuria and suprapubic pain. She has not had recent fevers, chills, nausea or vomiting.  She is bothered a very great deal with waking at night because she has the urge to urinate.  She is bothered a great deal with nighttime urination. She is bothered quite a bit with the sudden urge to urinate with little or no warning.   She does have sleep apnea and sleeps with a CPAP machine.   She finds the Myrbetriq helpful in controlling her urinary incontinence.   She is cathed for an UA and it is unremarkable except for 3-10 RBC's/hpf.  The RBC's are most likely due to irritation caused by cathing.   Her PVR today was 60 mL.     PMH: Past Medical History:  Diagnosis Date  . Anemia   . Anxiety   . Arthritis, degenerative   . Chronic cystitis   . Depression   . Diabetes mellitus without complication ([place])   . GERD (gastroesophageal reflux disease)   . Headache   . Heart disease   . HLD (hyperlipidemia)   . HTN (hypertension)   . Hypertension   . Incontinence in female   . Murmur   . Obesity   . Sleep apnea   . Urge incontinence of urine     Surgical History: Past Surgical History:  Procedure Laterality Date  . BREAST BIOPSY Right 2004   neg  . BUNIONECTOMY    . CARDIAC  SURGERY     Stent  . CHOLECYSTECTOMY    . JOINT REPLACEMENT     TKR Bilateral  . SEPTOPLASTY      Home Medications:    Medication List       Accurate as of 05/04/16 11:59 PM. Always use your most recent med list.          aspirin EC 81 MG tablet Take 81 mg by mouth daily.   budesonide-formoterol 80-4.5 MCG/ACT inhaler Commonly known as:  SYMBICORT Inhale 2 puffs into the lungs 2 (two) times daily.   carvedilol 3.125 MG tablet Commonly known as:  COREG Take 3.125 mg by mouth 2 (two) times daily with a meal.   ciprofloxacin 500 MG tablet Commonly known as:  CIPRO Take 1 tablet (500 mg total) by mouth 2 (two) times daily.   clopidogrel 75 MG tablet Commonly known as:  PLAVIX Take 75 mg by mouth daily.   conjugated estrogens vaginal cream Commonly known as:  PREMARIN Place 1 Applicatorful vaginally daily. Apply 0.5mg  (pea-sized amount)  just inside the vaginal introitus with a finger-tip every night for two weeks and then Monday, Wednesday and Friday nights.   Cranberry 500 MG Chew Chew 1 tablet by mouth daily.   ESGIC 50-325-40 MG tablet Generic drug:  butalbital-acetaminophen-caffeine Take 1 tablet by  mouth at bedtime as needed for headache.   estradiol 0.1 MG/GM vaginal cream Commonly known as:  ESTRACE VAGINAL Apply 0.5mg  (pea-sized amount)  just inside the vaginal introitus with a finger-tip every night for two weeks and then Monday, Wednesday and Friday nights.   ferrous sulfate 325 (65 FE) MG tablet Take by mouth.   FIBER-LAX PO Take 1 tablet by mouth daily.   glipiZIDE 5 MG tablet Commonly known as:  GLUCOTROL 5 mg 2 (two) times daily.   HYDROcodone-acetaminophen 5-325 MG tablet Commonly known as:  NORCO/VICODIN Take 1 tablet by mouth once.   JANUMET XR 50-1000 MG Tb24 Generic drug:  SitaGLIPtin-MetFORMIN HCl Take 1 tablet by mouth 2 (two) times daily.   LANTUS SOLOSTAR 100 UNIT/ML Solostar Pen Generic drug:  Insulin Glargine Inject 5 Units  into the skin at bedtime.   meloxicam 15 MG tablet Commonly known as:  MOBIC Take 15 mg by mouth daily.   mirabegron ER 25 MG Tb24 tablet Commonly known as:  MYRBETRIQ Take 1 tablet (25 mg total) by mouth daily.   nortriptyline 10 MG capsule Commonly known as:  PAMELOR Take 10 mg by mouth at bedtime.   OCUVITE PRESERVISION PO Take 1 capsule by mouth 2 (two) times daily. Reported on 09/09/2015   PRESERVISION/LUTEIN Caps Take by mouth.   ondansetron 4 MG tablet Commonly known as:  ZOFRAN Take 1 tablet (4 mg total) by mouth daily as needed for nausea or vomiting.   pantoprazole 40 MG tablet Commonly known as:  PROTONIX Take 40 mg by mouth daily.   potassium chloride SA 20 MEQ tablet Commonly known as:  K-DUR,KLOR-CON Take 20 mEq by mouth 2 (two) times daily.   predniSONE 20 MG tablet Commonly known as:  DELTASONE Take 2 tablets (40 mg total) by mouth daily with breakfast.   pregabalin 75 MG capsule Commonly known as:  LYRICA Take 75 mg by mouth 2 (two) times daily.   REFRESH TEARS 0.5 % Soln Generic drug:  carboxymethylcellulose Apply to eye.   simvastatin 40 MG tablet Commonly known as:  ZOCOR Take 40 mg by mouth daily at 6 PM.   valsartan 160 MG tablet Commonly known as:  DIOVAN Take 160 mg by mouth daily. Reported on 09/09/2015   Verapamil HCl CR 300 MG Cp24 Take 1 capsule by mouth daily.   VIIBRYD 20 MG Tabs Generic drug:  Vilazodone HCl Take 40 mg by mouth.   VITAMIN B-12 PO Take 1 tablet by mouth daily.       Allergies:  Allergies  Allergen Reactions  . Micardis [Telmisartan] Other (See Comments)    Dizziness  . Accupril [Quinapril Hcl] Rash  . Quinapril Rash    Family History: Family History  Problem Relation Age of Onset  . Breast cancer Cousin   . Skin cancer    . Diabetes    . Hypercholesterolemia    . Hypertension    . Bladder Cancer Maternal Aunt   . COPD    . Kidney disease Neg Hx     Social History:  reports that she has  never smoked. She has never used smokeless tobacco. She reports that she does not drink alcohol or use drugs.  ROS: UROLOGY Frequent Urination?: No Hard to postpone urination?: No Burning/pain with urination?: No Get up at night to urinate?: No Leakage of urine?: No Urine stream starts and stops?: No Trouble starting stream?: No Do you have to strain to urinate?: No Blood in urine?: No Urinary tract infection?:  Yes Sexually transmitted disease?: No Injury to kidneys or bladder?: No Painful intercourse?: No Weak stream?: No Currently pregnant?: No Vaginal bleeding?: No Last menstrual period?: n  Gastrointestinal Nausea?: No Vomiting?: No Indigestion/heartburn?: No Diarrhea?: No Constipation?: No  Constitutional Fever: No Night sweats?: No Weight loss?: No Fatigue?: No  Skin Skin rash/lesions?: No Itching?: No  Eyes Blurred vision?: No Double vision?: No  Ears/Nose/Throat Sore throat?: No Sinus problems?: No  Hematologic/Lymphatic Swollen glands?: No Easy bruising?: No  Cardiovascular Leg swelling?: No Chest pain?: No  Respiratory Cough?: No Shortness of breath?: No  Endocrine Excessive thirst?: No  Musculoskeletal Back pain?: No Joint pain?: No  Neurological Headaches?: No Dizziness?: No  Psychologic Depression?: No Anxiety?: No  Physical Exam: BP (!) 149/79   Pulse 88   Ht 5\' 1"  (1.549 m)   Wt 205 lb 1.6 oz (93 kg)   BMI 38.75 kg/m   Constitutional: Well nourished. Alert and oriented, No acute distress. HEENT: [place] AT, moist mucus membranes. Trachea midline, no masses. Cardiovascular: No clubbing, cyanosis, or edema. Respiratory: Normal respiratory effort, no increased work of breathing. GI: Abdomen is soft, non tender, non distended, no abdominal masses. Liver and spleen not palpable.  No hernias appreciated.  Stool sample for occult testing is not indicated.   GU: No CVA tenderness.  No bladder fullness or masses.  Atrophic  external genitalia, normal pubic hair distribution, no lesions.  Normal urethral meatus, no lesions, no prolapse, no discharge.   Urethral caruncle.   No bladder fullness, tenderness or masses. Pale vagina mucosa, poor estrogen effect, no discharge, no lesions, poor pelvic support, Grade III cystocele is noted.  Rectocele is  noted.  No cervical motion tenderness.  Uterus is freely mobile and non-fixed.  No adnexal/parametria masses or tenderness noted.  Anus and perineum are without rashes or lesions.    Skin: No rashes, bruises or suspicious lesions. Lymph: No cervical or inguinal adenopathy. Neurologic: Grossly intact, no focal deficits, moving all 4 extremities. Psychiatric: Normal mood and affect.  Laboratory Data: Lab Results  Component Value Date   WBC 11.1 (H) 04/16/2016   HGB 11.7 (L) 04/16/2016   HCT 35.0 04/16/2016   MCV 79.0 (L) 04/16/2016   PLT 149 (L) 04/16/2016   Lab Results  Component Value Date   CREATININE 1.07 (H) 04/16/2016   Lab Results  Component Value Date   HGBA1C 7.3 (H) 04/16/2016      Component Value Date/Time   CHOL 141 06/30/2011 0551   HDL 63 (H) 06/30/2011 0551   VLDL 14 06/30/2011 0551   LDLCALC 64 06/30/2011 0551   Lab Results  Component Value Date   AST 25 04/14/2016   Lab Results  Component Value Date   ALT 17 04/14/2016     Urinalysis 3-10RBC's/hpf- most likely due to cathing.  See EPIC.    Assessment & Plan:    1. Recurrent UTI's  - UA without WBC's and is nitrite negative  - Reviewed UTI preventative strategies  - Asked patient to contact our office for symptoms of an UTI  - Urinalysis, Complete  2. Vaginal atrophy  - I explained to the patient that when women go through menopause and her estrogen levels are severely diminished, the normal vaginal flora will change.  This is due to an increase of the vaginal canal's pH. Because of this, the vaginal canal may be colonized by bacteria from the rectum instead of the protective  lactobacillus.  This accompanied by the loss of the mucus  barrier with vaginal atrophy is a cause of recurrent urinary tract infections.  - I explained to the patient that In some studies, the use of vaginal estrogen cream has been demonstrated to reduce  recurrent urinary tract infections to one a year.   - Patient was given a sample of vaginal estrogen cream (Estrace) and instructed to apply 0.5mg  (pea-sized amount)  just inside the vaginal introitus with a finger-tip every night for two weeks and then Monday, Wednesday and Friday nights.  I explained to the patient that vaginally administered estrogen, which causes only a slight increase in the blood estrogen levels, have fewer contraindications and adverse systemic effects that oral HT.  - I have also given prescriptions for the Estrace cream and Premarin cream, so that the patient may carry them to the pharmacy to see which one of the branded creams would be most economical for her.  If she finds both medications cost prohibitive, she is instructed to call the office.  We can then call in a compounded vaginal estrogen cream for the patient that may be more affordable.    - She will follow up in one year for an exam.    3. Incontinence:   Patient's incontinence is well controlled on Myrbetriq 25 mg daily. She will continue that medication.  Refills are sent to her pharmacy.  She will follow up in one year for a PVR and UA.    4. Cystocele:   Manage conservatively at this time.  We will reassess when she returns in one year.    5. Nocturia  - encouraged the patient to continue using her CPAP machine  - stop fluids at 6 pm  - reduce dietary sodium  - continue the Myrbetriq   Return in about 1 year (around 05/04/2017) for OAB questionnaire and PVR.  These notes generated with voice recognition software. I apologize for typographical errors.  Zara Council, [facility] [address] [place],  [place] [address] [phone]

## 2016-05-04 NOTE — Progress Notes (Signed)
In and Out Catheterization  Patient is present today for a I & O catheterization due to recent ER visit for UTI. Patient was cleaned and prepped in a sterile fashion with betadine and Lidocaine 2% jelly was instilled into the urethra.  A 14FR cath was inserted no complications were noted , 64ml of urine return was noted, urine was yellow in color. A clean urine sample was collected for Urinalysis. Bladder was drained  and catheter was removed with out difficulty.    Preformed by: Zara Council PA-C

## 2016-05-04 NOTE — Patient Instructions (Signed)
I have given you two prescriptions for a vaginal estrogen cream.  Estrace and Premarin.  Please take these to your pharmacy and see which one your insurance covers.  If both are too expensive, please call the office at [phone] for an alternative.  You are given a sample of vaginal estrogen cream (Estrace) and instructed to apply 0.5mg  (pea-sized amount)  just inside the vaginal introitus with a finger-tip every night for two weeks.

## 2016-05-07 ENCOUNTER — Emergency Department: Payer: Medicare Other

## 2016-05-07 ENCOUNTER — Encounter: Payer: Self-pay | Admitting: Emergency Medicine

## 2016-05-07 ENCOUNTER — Emergency Department
Admission: EM | Admit: 2016-05-07 | Discharge: 2016-05-07 | Disposition: A | Discharge status: Home / Self Care | Payer: Medicare Other | Attending: Emergency Medicine | Admitting: Emergency Medicine

## 2016-05-07 DIAGNOSIS — Z79899 Other long term (current) drug therapy: Secondary | ICD-10-CM | POA: Insufficient documentation

## 2016-05-07 DIAGNOSIS — W010XXA Fall on same level from slipping, tripping and stumbling without subsequent striking against object, initial encounter: Secondary | ICD-10-CM | POA: Diagnosis not present

## 2016-05-07 DIAGNOSIS — Z794 Long term (current) use of insulin: Secondary | ICD-10-CM | POA: Insufficient documentation

## 2016-05-07 DIAGNOSIS — Z7982 Long term (current) use of aspirin: Secondary | ICD-10-CM | POA: Insufficient documentation

## 2016-05-07 DIAGNOSIS — Z791 Long term (current) use of non-steroidal anti-inflammatories (NSAID): Secondary | ICD-10-CM | POA: Diagnosis not present

## 2016-05-07 DIAGNOSIS — Y92002 Bathroom of unspecified non-institutional (private) residence single-family (private) house as the place of occurrence of the external cause: Secondary | ICD-10-CM | POA: Diagnosis not present

## 2016-05-07 DIAGNOSIS — S52502A Unspecified fracture of the lower end of left radius, initial encounter for closed fracture: Secondary | ICD-10-CM | POA: Insufficient documentation

## 2016-05-07 DIAGNOSIS — E119 Type 2 diabetes mellitus without complications: Secondary | ICD-10-CM | POA: Diagnosis not present

## 2016-05-07 DIAGNOSIS — I10 Essential (primary) hypertension: Secondary | ICD-10-CM | POA: Diagnosis not present

## 2016-05-07 DIAGNOSIS — Y9389 Activity, other specified: Secondary | ICD-10-CM | POA: Diagnosis not present

## 2016-05-07 DIAGNOSIS — S6992XA Unspecified injury of left wrist, hand and finger(s), initial encounter: Secondary | ICD-10-CM | POA: Diagnosis present

## 2016-05-07 DIAGNOSIS — Y999 Unspecified external cause status: Secondary | ICD-10-CM | POA: Diagnosis not present

## 2016-05-07 MED ORDER — HYDROCODONE-ACETAMINOPHEN 5-325 MG PO TABS: 1.0000 | ORAL_TABLET | ORAL | 0 refills | Status: DC | PRN

## 2016-05-07 NOTE — ED Notes (Signed)
See triage note  States she got up to go to the bathroom  And fell  Having pain /swelling to left wrist  Positive pulses

## 2016-05-07 NOTE — ED Triage Notes (Signed)
Fell with wrist injury going to the bathroom at 4am

## 2016-05-07 NOTE — Discharge Instructions (Signed)
Please follow up with orthopedics for further management of your wrist fracture.

## 2016-05-07 NOTE — ED Provider Notes (Signed)
[facility] Emergency Department Provider Note   ____________________________________________   None    (approximate)  I have reviewed the triage vital signs and the nursing notes.   HISTORY  Chief Complaint Wrist Pain    HPI Brandi [name] is a 80 y.o. female presents to ED with left wrist pain since 4:30 this morning after getting up to go to the bathroom and she tripped and fell onto her left wrist. Denies any head trauma or loss of consciousness, nausea, vomiting, visual changes, or any other trauma.  Took 2 tylenol and one meloxicam with some pain relief. States her pain is 5/10 at this time.    Past Medical History:  Diagnosis Date  . Anemia   . Anxiety   . Arthritis, degenerative   . Chronic cystitis   . Depression   . Diabetes mellitus without complication ([place])   . GERD (gastroesophageal reflux disease)   . Headache   . Heart disease   . HLD (hyperlipidemia)   . HTN (hypertension)   . Hypertension   . Incontinence in female   . Murmur   . Obesity   . Sleep apnea   . Urge incontinence of urine     Patient Active Problem List   Diagnosis Date Noted  . Severe sepsis ([place]) 04/14/2016  . Recurrent UTI 09/13/2015  . Cystocele, grade 3 09/13/2015  . Incontinence 09/13/2015    Past Surgical History:  Procedure Laterality Date  . BREAST BIOPSY Right 2004   neg  . BUNIONECTOMY    . CARDIAC SURGERY     Stent  . CHOLECYSTECTOMY    . JOINT REPLACEMENT     TKR Bilateral  . SEPTOPLASTY      Prior to Admission medications   Medication Sig Start Date End Date Taking? Authorizing Provider  aspirin EC 81 MG tablet Take 81 mg by mouth daily.    Historical Provider, MD  budesonide-formoterol (SYMBICORT) 80-4.5 MCG/ACT inhaler Inhale 2 puffs into the lungs 2 (two) times daily.    Historical Provider, MD  butalbital-acetaminophen-caffeine (ESGIC) 50-325-40 MG tablet Take 1 tablet by mouth at bedtime as needed for headache.    Historical  Provider, MD  Calcium Polycarbophil (FIBER-LAX PO) Take 1 tablet by mouth daily.    Historical Provider, MD  carboxymethylcellulose (REFRESH TEARS) 0.5 % SOLN Apply to eye.    Historical Provider, MD  carvedilol (COREG) 3.125 MG tablet Take 3.125 mg by mouth 2 (two) times daily with a meal.    Historical Provider, MD  clopidogrel (PLAVIX) 75 MG tablet Take 75 mg by mouth daily.    Historical Provider, MD  conjugated estrogens (PREMARIN) vaginal cream Place 1 Applicatorful vaginally daily. Apply 0.5mg  (pea-sized amount)  just inside the vaginal introitus with a finger-tip every night for two weeks and then Monday, Wednesday and Friday nights. 05/04/16   Larene Beach A McGowan, PA-C  Cranberry 500 MG CHEW Chew 1 tablet by mouth daily.    Historical Provider, MD  Cyanocobalamin (VITAMIN B-12 PO) Take 1 tablet by mouth daily.    Historical Provider, MD  estradiol (ESTRACE VAGINAL) 0.1 MG/GM vaginal cream Apply 0.5mg  (pea-sized amount)  just inside the vaginal introitus with a finger-tip every night for two weeks and then Monday, Wednesday and Friday nights. 05/04/16   Nori Riis, PA-C  ferrous sulfate 325 (65 FE) MG tablet Take by mouth.    Historical Provider, MD  glipiZIDE (GLUCOTROL) 5 MG tablet 5 mg 2 (two) times daily.  08/19/13  Historical Provider, MD  HYDROcodone-acetaminophen (NORCO) 5-325 MG tablet Take 1 tablet by mouth every 4 (four) hours as needed for moderate pain. 05/07/16   Pierce Crane Rhaelyn Giron, PA-C  Insulin Glargine (LANTUS SOLOSTAR) 100 UNIT/ML Solostar Pen Inject 5 Units into the skin at bedtime.  07/20/15   Historical Provider, MD  meloxicam (MOBIC) 15 MG tablet Take 15 mg by mouth daily.    Historical Provider, MD  mirabegron ER (MYRBETRIQ) 25 MG TB24 tablet Take 1 tablet (25 mg total) by mouth daily. 05/04/16   Nori Riis, PA-C  Multiple Vitamins-Minerals (OCUVITE PRESERVISION PO) Take 1 capsule by mouth 2 (two) times daily. Reported on 09/09/2015    Historical Provider, MD    Multiple Vitamins-Minerals (PRESERVISION/LUTEIN) CAPS Take by mouth. 03/12/12   Historical Provider, MD  nortriptyline (PAMELOR) 10 MG capsule Take 10 mg by mouth at bedtime.    Historical Provider, MD  ondansetron (ZOFRAN) 4 MG tablet Take 1 tablet (4 mg total) by mouth daily as needed for nausea or vomiting. 11/21/15   Earleen Newport, MD  pantoprazole (PROTONIX) 40 MG tablet Take 40 mg by mouth daily.    Historical Provider, MD  potassium chloride SA (K-DUR,KLOR-CON) 20 MEQ tablet Take 20 mEq by mouth 2 (two) times daily.    Historical Provider, MD  pregabalin (LYRICA) 75 MG capsule Take 75 mg by mouth 2 (two) times daily.    Historical Provider, MD  simvastatin (ZOCOR) 40 MG tablet Take 40 mg by mouth daily at 6 PM.    Historical Provider, MD  SitaGLIPtin-MetFORMIN HCl (JANUMET XR) 50-1000 MG TB24 Take 1 tablet by mouth 2 (two) times daily. 12/02/14   Historical Provider, MD  valsartan (DIOVAN) 160 MG tablet Take 160 mg by mouth daily. Reported on 09/09/2015    Historical Provider, MD  Verapamil HCl CR 300 MG CP24 Take 1 capsule by mouth daily.    Historical Provider, MD  Vilazodone HCl (VIIBRYD) 20 MG TABS Take 40 mg by mouth.  03/11/12   Historical Provider, MD    Allergies Micardis [telmisartan]; Accupril [quinapril hcl]; and Quinapril  Family History  Problem Relation Age of Onset  . Breast cancer Cousin   . Skin cancer    . Diabetes    . Hypercholesterolemia    . Hypertension    . Bladder Cancer Maternal Aunt   . COPD    . Kidney disease Neg Hx     Social History Social History  Substance Use Topics  . Smoking status: Never Smoker  . Smokeless tobacco: Never Used  . Alcohol use No    Review of Systems Constitutional: No fever/chills Eyes: No visual changes. Cardiovascular: Denies chest pain. Respiratory: Denies shortness of breath. Gastrointestinal: No abdominal pain.  No nausea, no vomiting.  No diarrhea.   Genitourinary: Negative for dysuria. Musculoskeletal:  positive for left wrist pain Skin: Negative for rash. Neurological: Negative for headaches, focal weakness or numbness. 10-point ROS otherwise negative.  ____________________________________________   PHYSICAL EXAM:  VITAL SIGNS: ED Triage Vitals  Enc Vitals Group     BP 05/07/16 1246 (!) 187/77     Pulse Rate 05/07/16 1246 88     Resp 05/07/16 1246 20     Temp --      Temp src --      SpO2 05/07/16 1246 98 %     Weight 05/07/16 1246 205 lb (93 kg)     Height 05/07/16 1246 5\' 1"  (1.549 m)     Head Circumference --  Peak Flow --      Pain Score 05/07/16 1247 5     Pain Loc --      Pain Edu? --      Excl. in [place]? --     Constitutional: Alert and oriented. Well appearing and in no acute distress. Eyes: Conjunctivae are normal.  Head: Atraumatic. Cardiovascular: Normal rate, regular rhythm. No murmurs, rubs or gallops. Grossly normal heart sounds.  Good peripheral circulation. Respiratory: Normal respiratory effort.  No retractions. Lungs CTAB. Gastrointestinal: Soft and nontender. No distention. No abdominal bruits.  Musculoskeletal: AROM limited in left wrist flexion, extension, inversion and eversion. Muscle strength 3/5 in left wrist flexion and extension. AROM normal in left digit flexion, extension, adduction and abduction. AROM normal in left elbow flexion and extension. Muscle strength 5/5 in elbow flexion and extension. Tender to palpation on the radial side of the left wrist and left snuffbox.  Edema and ecchymosis noted on exam of left wrist.  Neurologic:  Normal speech and language. No gross focal neurologic deficits are appreciated. No gait instability. Skin:  Skin is warm, dry and intact. No rash noted. Psychiatric: Mood and affect are normal. Speech and behavior are normal.  ____________________________________________   LABS (all labs ordered are listed, but only abnormal results are displayed)  Labs Reviewed - No data to  display ____________________________________________   RADIOLOGY  CLINICAL DATA:  Fall, left wrist pain  EXAM: LEFT WRIST - COMPLETE 3+ VIEW  COMPARISON:  None.  FINDINGS: There is a fracture through the distal left radius. No significant displacement. No ulnar abnormality. No subluxation or dislocation.  IMPRESSION: Nondisplaced distal left radial fracture.  ____________________________________________   PROCEDURES  Procedure(s) performed: None  Procedures  Critical Care performed: No  ____________________________________________   INITIAL IMPRESSION / ASSESSMENT AND PLAN / ED COURSE  Pertinent labs & imaging results that were available during my care of the patient were reviewed by me and considered in my medical decision making (see chart for details).  Tajanique Carrete is a 80 year old female presenting to the ED after falling on her left wrist this morning at 4:30.  Admits to limited range of motion in her left wrist with noticeable swelling and ecchymosis around the left distal radial head. X-ray shows a fracture through the distal left radius, no displacement, no ulnar abnormality, and no subluxation. Pt was placed in a volar splint while here in the ED. Pt will be discharged with a prescription for Norco PO for pain control. Follow-up with orthopedics for further management and treatment. Come back to the ED with any worsening symptoms.   Clinical Course      ____________________________________________   FINAL CLINICAL IMPRESSION(S) / ED DIAGNOSES  Final diagnoses:  Closed fracture of distal end of left radius, unspecified fracture morphology, initial encounter      NEW MEDICATIONS STARTED DURING THIS VISIT:  Discharge Medication List as of 05/07/2016  1:55 PM       Note:  This document was prepared using Dragon voice recognition software and may include unintentional dictation errors.   Arlyss Repress, PA-C 05/07/16 [place], MD 05/07/16 [phone]

## 2016-07-30 ENCOUNTER — Emergency Department
Admission: EM | Admit: 2016-07-30 | Discharge: 2016-07-30 | Disposition: A | Discharge status: Home / Self Care | Payer: Medicare Other | Attending: Emergency Medicine | Admitting: Emergency Medicine

## 2016-07-30 DIAGNOSIS — Z794 Long term (current) use of insulin: Secondary | ICD-10-CM | POA: Insufficient documentation

## 2016-07-30 DIAGNOSIS — Z7982 Long term (current) use of aspirin: Secondary | ICD-10-CM | POA: Diagnosis not present

## 2016-07-30 DIAGNOSIS — Z79899 Other long term (current) drug therapy: Secondary | ICD-10-CM | POA: Insufficient documentation

## 2016-07-30 DIAGNOSIS — I10 Essential (primary) hypertension: Secondary | ICD-10-CM | POA: Insufficient documentation

## 2016-07-30 DIAGNOSIS — E119 Type 2 diabetes mellitus without complications: Secondary | ICD-10-CM | POA: Diagnosis not present

## 2016-07-30 DIAGNOSIS — R55 Syncope and collapse: Secondary | ICD-10-CM | POA: Diagnosis not present

## 2016-07-30 LAB — BASIC METABOLIC PANEL
Anion gap: 9 (ref 5–15)
BUN: 15 mg/dL (ref 6–20)
CHLORIDE: 99 mmol/L — AB (ref 101–111)
CO2: 26 mmol/L (ref 22–32)
CREATININE: 0.83 mg/dL (ref 0.44–1.00)
Calcium: 9.3 mg/dL (ref 8.9–10.3)
GFR calc Af Amer: >60 mL/min (ref >60)
GFR calc non Af Amer: >60 mL/min (ref >60)
Glucose, Bld: 216 mg/dL — ABNORMAL HIGH (ref 65–99)
POTASSIUM: 4.3 mmol/L (ref 3.5–5.1)
Sodium: 134 mmol/L — ABNORMAL LOW (ref 135–145)

## 2016-07-30 LAB — CBC
HEMATOCRIT: 37.4 % (ref 35.0–47.0)
Hemoglobin: 12.5 g/dL (ref 12.0–16.0)
MCH: 26.6 pg (ref 26.0–34.0)
MCHC: 33.5 g/dL (ref 32.0–36.0)
MCV: 79.4 fL — AB (ref 80.0–100.0)
PLATELETS: 196 10*3/uL (ref 150–440)
RBC: 4.71 MIL/uL (ref 3.80–5.20)
RDW: 14.6 % — AB (ref 11.5–14.5)
WBC: 6.7 10*3/uL (ref 3.6–11.0)

## 2016-07-30 LAB — URINALYSIS, COMPLETE (UACMP) WITH MICROSCOPIC
BILIRUBIN URINE: NEGATIVE
Bacteria, UA: NONE SEEN
Glucose, UA: NEGATIVE mg/dL
Hgb urine dipstick: NEGATIVE
KETONES UR: NEGATIVE mg/dL
LEUKOCYTES UA: NEGATIVE
Nitrite: NEGATIVE
PH: 7 (ref 5.0–8.0)
Protein, ur: NEGATIVE mg/dL
RBC / HPF: NONE SEEN RBC/hpf (ref 0–5)
SPECIFIC GRAVITY, URINE: 1.005 (ref 1.005–1.030)

## 2016-07-30 LAB — GLUCOSE, CAPILLARY: GLUCOSE-CAPILLARY: 202 mg/dL — AB (ref 65–99)

## 2016-07-30 LAB — TROPONIN I

## 2016-07-30 MED ORDER — VERAPAMIL HCL ER 180 MG PO TBCR
360.0000 mg | EXTENDED_RELEASE_TABLET | Freq: Once | ORAL | Status: AC
  Administered 2016-07-30: 360 mg via ORAL
  Filled 2016-07-30: qty 2

## 2016-07-30 MED ORDER — SODIUM CHLORIDE 0.9 % IV BOLUS (SEPSIS)
1000.0000 mL | Freq: Once | INTRAVENOUS | Status: AC
  Administered 2016-07-30: 1000 mL via INTRAVENOUS

## 2016-07-30 NOTE — ED Notes (Signed)
Pt up and ambulated to the toilet with minimal assist

## 2016-07-30 NOTE — Discharge Instructions (Signed)
Please follow-up your primary care doctor in the next 2 days for recheck/reevaluation. Please also call the number provided for cardiology to arrange a follow-up appointment for further evaluation. Return to the emergency department for any lightheadedness, if you pass out, develop any chest pain or trouble breathing, or any other symptom personally concerning to yourself.

## 2016-07-30 NOTE — ED Notes (Signed)
Notified pharmacy of verapamil med

## 2016-07-30 NOTE — ED Provider Notes (Signed)
[facility] Emergency Department Provider Note  Time seen: 11:38 AM  I have reviewed the triage vital signs and the nursing notes.   HISTORY  Chief Complaint Loss of Consciousness    HPI Brandi [name] is a 81 y.o. female with a past medical history of anxiety, anemia, diabetes, gastric reflux, hypertension, hyperlipidemia, who presents to the emergency Department after syncopal episode. According to the patient she was singing in church when she began feeling lightheaded any sensation of chest tightness like she could not breathe. Patient states she went to sit down but must have briefly passed out. Upon awakening the patient states she feels normal. Denies any chest discomfort. Currently denies any shortness breath nausea or diaphoresis. Patient states she feels back to normal. States a history of syncopal events with the last one occurring approximately 2 years ago per patient. Denies any cardiac history.  Past Medical History:  Diagnosis Date  . Anemia   . Anxiety   . Arthritis, degenerative   . Chronic cystitis   . Depression   . Diabetes mellitus without complication ([place])   . GERD (gastroesophageal reflux disease)   . Headache   . Heart disease   . HLD (hyperlipidemia)   . HTN (hypertension)   . Hypertension   . Incontinence in female   . Murmur   . Obesity   . Sleep apnea   . Urge incontinence of urine     Patient Active Problem List   Diagnosis Date Noted  . Severe sepsis ([place]) 04/14/2016  . Recurrent UTI 09/13/2015  . Cystocele, grade 3 09/13/2015  . Incontinence 09/13/2015    Past Surgical History:  Procedure Laterality Date  . BREAST BIOPSY Right 2004   neg  . BUNIONECTOMY    . CARDIAC SURGERY     Stent  . CHOLECYSTECTOMY    . JOINT REPLACEMENT     TKR Bilateral  . SEPTOPLASTY      Prior to Admission medications   Medication Sig Start Date End Date Taking? Authorizing Provider  aspirin EC 81 MG tablet Take 81 mg by mouth  daily.    Historical Provider, MD  budesonide-formoterol (SYMBICORT) 80-4.5 MCG/ACT inhaler Inhale 2 puffs into the lungs 2 (two) times daily.    Historical Provider, MD  butalbital-acetaminophen-caffeine (ESGIC) 50-325-40 MG tablet Take 1 tablet by mouth at bedtime as needed for headache.    Historical Provider, MD  Calcium Polycarbophil (FIBER-LAX PO) Take 1 tablet by mouth daily.    Historical Provider, MD  carboxymethylcellulose (REFRESH TEARS) 0.5 % SOLN Apply to eye.    Historical Provider, MD  carvedilol (COREG) 3.125 MG tablet Take 3.125 mg by mouth 2 (two) times daily with a meal.    Historical Provider, MD  clopidogrel (PLAVIX) 75 MG tablet Take 75 mg by mouth daily.    Historical Provider, MD  conjugated estrogens (PREMARIN) vaginal cream Place 1 Applicatorful vaginally daily. Apply 0.5mg  (pea-sized amount)  just inside the vaginal introitus with a finger-tip every night for two weeks and then Monday, Wednesday and Friday nights. 05/04/16   Larene Beach A McGowan, PA-C  Cranberry 500 MG CHEW Chew 1 tablet by mouth daily.    Historical Provider, MD  Cyanocobalamin (VITAMIN B-12 PO) Take 1 tablet by mouth daily.    Historical Provider, MD  estradiol (ESTRACE VAGINAL) 0.1 MG/GM vaginal cream Apply 0.5mg  (pea-sized amount)  just inside the vaginal introitus with a finger-tip every night for two weeks and then Monday, Wednesday and Friday nights. 05/04/16  Shannon A McGowan, PA-C  ferrous sulfate 325 (65 FE) MG tablet Take by mouth.    Historical Provider, MD  glipiZIDE (GLUCOTROL) 5 MG tablet 5 mg 2 (two) times daily.  08/19/13   Historical Provider, MD  HYDROcodone-acetaminophen (NORCO) 5-325 MG tablet Take 1 tablet by mouth every 4 (four) hours as needed for moderate pain. 05/07/16   Pierce Crane Beers, PA-C  Insulin Glargine (LANTUS SOLOSTAR) 100 UNIT/ML Solostar Pen Inject 5 Units into the skin at bedtime.  07/20/15   Historical Provider, MD  meloxicam (MOBIC) 15 MG tablet Take 15 mg by mouth daily.     Historical Provider, MD  mirabegron ER (MYRBETRIQ) 25 MG TB24 tablet Take 1 tablet (25 mg total) by mouth daily. 05/04/16   Nori Riis, PA-C  Multiple Vitamins-Minerals (OCUVITE PRESERVISION PO) Take 1 capsule by mouth 2 (two) times daily. Reported on 09/09/2015    Historical Provider, MD  Multiple Vitamins-Minerals (PRESERVISION/LUTEIN) CAPS Take by mouth. 03/12/12   Historical Provider, MD  nortriptyline (PAMELOR) 10 MG capsule Take 10 mg by mouth at bedtime.    Historical Provider, MD  ondansetron (ZOFRAN) 4 MG tablet Take 1 tablet (4 mg total) by mouth daily as needed for nausea or vomiting. 11/21/15   Earleen Newport, MD  pantoprazole (PROTONIX) 40 MG tablet Take 40 mg by mouth daily.    Historical Provider, MD  potassium chloride SA (K-DUR,KLOR-CON) 20 MEQ tablet Take 20 mEq by mouth 2 (two) times daily.    Historical Provider, MD  pregabalin (LYRICA) 75 MG capsule Take 75 mg by mouth 2 (two) times daily.    Historical Provider, MD  simvastatin (ZOCOR) 40 MG tablet Take 40 mg by mouth daily at 6 PM.    Historical Provider, MD  SitaGLIPtin-MetFORMIN HCl (JANUMET XR) 50-1000 MG TB24 Take 1 tablet by mouth 2 (two) times daily. 12/02/14   Historical Provider, MD  valsartan (DIOVAN) 160 MG tablet Take 160 mg by mouth daily. Reported on 09/09/2015    Historical Provider, MD  Verapamil HCl CR 300 MG CP24 Take 1 capsule by mouth daily.    Historical Provider, MD  Vilazodone HCl (VIIBRYD) 20 MG TABS Take 40 mg by mouth.  03/11/12   Historical Provider, MD    Allergies  Allergen Reactions  . Micardis [Telmisartan] Other (See Comments)    Dizziness  . Accupril [Quinapril Hcl] Rash  . Quinapril Rash    Family History  Problem Relation Age of Onset  . Breast cancer Cousin   . Skin cancer    . Diabetes    . Hypercholesterolemia    . Hypertension    . Bladder Cancer Maternal Aunt   . COPD    . Kidney disease Neg Hx     Social History Social History  Substance Use Topics  . Smoking  status: Never Smoker  . Smokeless tobacco: Never Used  . Alcohol use No    Review of Systems Constitutional: Negative for fever.Positive for LOC. Cardiovascular: Positive for chest tightness, now resolved Respiratory: Positive for shortness breath, now resolved Gastrointestinal: Negative for abdominal pain, vomiting and diarrhea. Neurological: Negative for headache 10-point ROS otherwise negative.  ____________________________________________   PHYSICAL EXAM:  VITAL SIGNS: ED Triage Vitals  Enc Vitals Group     BP 07/30/16 1131 (!) 188/87     Pulse Rate 07/30/16 1131 84     Resp 07/30/16 1131 19     Temp 07/30/16 1131 98.4 F (36.9 C)     Temp Source 07/30/16  1131 Oral     SpO2 07/30/16 1131 99 %     Weight 07/30/16 1131 220 lb 14.4 oz (100.2 kg)     Height 07/30/16 1131 5\' 1"  (1.549 m)     Head Circumference --      Peak Flow --      Pain Score 07/30/16 1132 0     Pain Loc --      Pain Edu? --      Excl. in [place]? --     Constitutional: Alert and oriented. Well appearing and in no distress. Eyes: Normal exam ENT   Head: Normocephalic and atraumatic   Mouth/Throat: Mucous membranes are moist. Cardiovascular: Normal rate, regular rhythm. No murmur Respiratory: Normal respiratory effort without tachypnea nor retractions. Breath sounds are clear Gastrointestinal: Soft and nontender. No distention.   Musculoskeletal: Nontender with normal range of motion in all extremities. No lower extremity tenderness or edema. Neurologic:  Normal speech and language. No gross focal neurologic deficits Skin:  Skin is warm, dry and intact.  Psychiatric: Mood and affect are normal.   ____________________________________________    EKG  EKG reviewed and interpreted by myself shows normal sinus rhythm at 77 bpm, narrow QRS, normal axis, normal intervals, no concerning ST changes. Reassuring EKG.  ____________________________________________    INITIAL IMPRESSION / ASSESSMENT  AND PLAN / ED COURSE  Pertinent labs & imaging results that were available during my care of the patient were reviewed by me and considered in my medical decision making (see chart for details).  The patient presents to the emergency department after a syncopal episode while singing in church. Patient does state some chest tightness and shortness breath during the episode. We will check labs, EKG and troponin. We will IV hydrate. Overall the patient appears very well currently with a normal physical examination denies any symptoms at this time. Patient states a history of syncopal events in the past with the last one occurring approximately 2 years ago.  Patient's EKG is reassuring. Labs are largely within normal limits. Patient continues to feel well, her blood pressure is increased. We will dose her home blood pressure medication continue to monitor closely. Patient will receive a second troponin at 2:15 PM at the second troponin is negative and the patient continues to feel well we will likely discharge home with PCP follow-up.  Repeat troponin is negative. Patient continues to feel well. We will discharge the patient home with PCP follow-up.  ____________________________________________   FINAL CLINICAL IMPRESSION(S) / ED DIAGNOSES  Syncope    Harvest Dark, MD 07/30/16 1527

## 2016-09-07 ENCOUNTER — Ambulatory Visit: Payer: Medicare Other | Admitting: Urology

## 2016-09-11 DIAGNOSIS — R519 Headache, unspecified: Secondary | ICD-10-CM | POA: Insufficient documentation

## 2016-09-11 DIAGNOSIS — G252 Other specified forms of tremor: Secondary | ICD-10-CM | POA: Insufficient documentation

## 2016-09-11 DIAGNOSIS — R51 Headache: Secondary | ICD-10-CM

## 2016-09-13 NOTE — Progress Notes (Signed)
09/14/2016 11:53 AM   Brandi [name] Apr 23, 1936 [id number]  Referring provider: Madelyn Brunner, MD [facility] [place], [place] [address]  Chief Complaint  Patient presents with  . Urinary Incontinence    Stress  1 year follow  up  . Nocturia    HPI: 81 yo WF with history of recurrent UTI's, incontinence, vaginal atrophy, cystocele and nocturia presents for a yearly follow up.   Recurrent UTI's Patient with greater than 3 urinary tract infections over the last year. Her risk factor for recurrent UTIs vaginal atrophy, incontinence and age.   Vaginal atrophy Patient is applying the vaginal cream 3 nights weekly.  She was applying both the Estrace and Premarin cream.  I explained to her that she only needed to use one of the creams.  The two prescriptions were given, so that she could see what brand of estrogen cream her insurance covered.  NOT TO USE BOTH CREAMS.      Incontinence She is experiencing urgency x 4-7, frequency x 8, incontinence x 0-3 and nocturia x 0-3.  She finds the Myrbetriq helpful in controlling her urinary incontinence.   Nocturia Patient is using her CPAP machine.  She is having nocturia x 0-3.    Cystocele Found incidentally on exam. Patient denies any vaginal bulge.   PMH: Past Medical History:  Diagnosis Date  . Anemia   . Anxiety   . Arthritis, degenerative   . Chronic cystitis   . Depression   . Diabetes mellitus without complication ([place])   . GERD (gastroesophageal reflux disease)   . Headache   . Heart disease   . HLD (hyperlipidemia)   . HTN (hypertension)   . Hypertension   . Incontinence in female   . Murmur   . Obesity   . Sleep apnea   . Urge incontinence of urine     Surgical History: Past Surgical History:  Procedure Laterality Date  . BREAST BIOPSY Right 2004   neg  . BUNIONECTOMY    . CARDIAC SURGERY     Stent  . CHOLECYSTECTOMY    . JOINT REPLACEMENT     TKR Bilateral  .  SEPTOPLASTY      Home Medications:  Allergies as of 09/14/2016      Reactions   Micardis [telmisartan] Other (See Comments)   Dizziness   Accupril [quinapril Hcl] Rash   Quinapril Rash      Medication List       Accurate as of 09/14/16 11:53 AM. Always use your most recent med list.          aspirin EC 81 MG tablet Take 81 mg by mouth daily.   budesonide-formoterol 80-4.5 MCG/ACT inhaler Commonly known as:  SYMBICORT Inhale 2 puffs into the lungs 2 (two) times daily.   carvedilol 3.125 MG tablet Commonly known as:  COREG Take 3.125 mg by mouth 2 (two) times daily with a meal.   clopidogrel 75 MG tablet Commonly known as:  PLAVIX Take 75 mg by mouth daily.   conjugated estrogens vaginal cream Commonly known as:  PREMARIN Place 1 Applicatorful vaginally daily. Apply 0.5mg  (pea-sized amount)  just inside the vaginal introitus with a finger-tip every night for two weeks and then Monday, Wednesday and Friday nights.   Cranberry 500 MG Chew Chew 1 tablet by mouth daily.   ESGIC 50-325-40 MG tablet Generic drug:  butalbital-acetaminophen-caffeine Take 1 tablet by mouth at bedtime as needed for headache.  estradiol 0.1 MG/GM vaginal cream Commonly known as:  ESTRACE VAGINAL Apply 0.5mg  (pea-sized amount)  just inside the vaginal introitus with a finger-tip every night for two weeks and then Monday, Wednesday and Friday nights.   ferrous sulfate 325 (65 FE) MG tablet Take by mouth.   FIBER-LAX PO Take 1 tablet by mouth daily.   FIFTY50 PEN NEEDLES 31G X 8 MM Misc Generic drug:  Insulin Pen Needle Use once daily.   glipiZIDE 5 MG tablet Commonly known as:  GLUCOTROL 5 mg 2 (two) times daily.   HYDROcodone-acetaminophen 5-325 MG tablet Commonly known as:  NORCO Take 1 tablet by mouth every 4 (four) hours as needed for moderate pain.   JANUMET XR 50-1000 MG Tb24 Generic drug:  SitaGLIPtin-MetFORMIN HCl Take 1 tablet by mouth 2 (two) times daily.   LANTUS  SOLOSTAR 100 UNIT/ML Solostar Pen Generic drug:  Insulin Glargine Inject 5 Units into the skin at bedtime.   meloxicam 15 MG tablet Commonly known as:  MOBIC Take 15 mg by mouth daily.   mirabegron ER 25 MG Tb24 tablet Commonly known as:  MYRBETRIQ Take 1 tablet (25 mg total) by mouth daily.   nortriptyline 10 MG capsule Commonly known as:  PAMELOR Take 10 mg by mouth at bedtime.   OCUVITE PRESERVISION PO Take 1 capsule by mouth 2 (two) times daily. Reported on 09/09/2015   PRESERVISION/LUTEIN Caps Take by mouth.   ondansetron 4 MG tablet Commonly known as:  ZOFRAN Take 1 tablet (4 mg total) by mouth daily as needed for nausea or vomiting.   pantoprazole 40 MG tablet Commonly known as:  PROTONIX Take 40 mg by mouth daily.   potassium chloride SA 20 MEQ tablet Commonly known as:  K-DUR,KLOR-CON Take 20 mEq by mouth 2 (two) times daily.   pregabalin 75 MG capsule Commonly known as:  LYRICA Take 75 mg by mouth 2 (two) times daily.   REFRESH TEARS 0.5 % Soln Generic drug:  carboxymethylcellulose Apply to eye.   simvastatin 40 MG tablet Commonly known as:  ZOCOR Take 20 mg by mouth daily at 6 PM.   valsartan 160 MG tablet Commonly known as:  DIOVAN Take 160 mg by mouth daily. Reported on 09/09/2015   Verapamil HCl CR 300 MG Cp24 Take 1 capsule by mouth daily.   VIIBRYD 20 MG Tabs Generic drug:  Vilazodone HCl Take 40 mg by mouth.   VITAMIN B-12 PO Take 1 tablet by mouth daily.       Allergies:  Allergies  Allergen Reactions  . Micardis [Telmisartan] Other (See Comments)    Dizziness  . Accupril [Quinapril Hcl] Rash  . Quinapril Rash    Family History: Family History  Problem Relation Age of Onset  . Breast cancer Cousin   . Skin cancer    . Diabetes    . Hypercholesterolemia    . Hypertension    . Bladder Cancer Maternal Aunt   . COPD    . Kidney disease Neg Hx   . Kidney cancer Neg Hx     Social History:  reports that she has never  smoked. She has never used smokeless tobacco. She reports that she does not drink alcohol or use drugs.  ROS: UROLOGY Frequent Urination?: Yes Hard to postpone urination?: No Burning/pain with urination?: No Get up at night to urinate?: Yes Leakage of urine?: No Urine stream starts and stops?: Yes Trouble starting stream?: No Do you have to strain to urinate?: No Blood in urine?: No  Urinary tract infection?: No Sexually transmitted disease?: No Injury to kidneys or bladder?: No Painful intercourse?: No Weak stream?: No Currently pregnant?: No Vaginal bleeding?: No Last menstrual period?: n  Gastrointestinal Nausea?: No Vomiting?: No Indigestion/heartburn?: No Diarrhea?: No Constipation?: No  Constitutional Fever: No Night sweats?: No Weight loss?: No Fatigue?: Yes  Skin Skin rash/lesions?: Yes Itching?: No  Eyes Blurred vision?: No Double vision?: No  Ears/Nose/Throat Sore throat?: No Sinus problems?: No  Hematologic/Lymphatic Swollen glands?: No Easy bruising?: No  Cardiovascular Leg swelling?: No Chest pain?: No  Respiratory Cough?: No Shortness of breath?: No  Endocrine Excessive thirst?: No  Musculoskeletal Back pain?: No Joint pain?: No  Neurological Headaches?: Yes Dizziness?: No  Psychologic Depression?: No Anxiety?: No  Physical Exam: BP (!) 165/92   Pulse 77   Ht 5\' 1"  (1.549 m)   Wt 211 lb (95.7 kg)   BMI 39.87 kg/m   Constitutional: Well nourished. Alert and oriented, No acute distress. HEENT: [place] AT, moist mucus membranes. Trachea midline, no masses. Cardiovascular: No clubbing, cyanosis, or edema. Respiratory: Normal respiratory effort, no increased work of breathing. GI: Abdomen is soft, non tender, non distended, no abdominal masses. Liver and spleen not palpable.  No hernias appreciated.  Stool sample for occult testing is not indicated.   GU: No CVA tenderness.  No bladder fullness or masses.  Atrophic external  genitalia, normal pubic hair distribution, no lesions.  Normal urethral meatus, no lesions, no prolapse, no discharge.   Urethral caruncle.   No bladder fullness, tenderness or masses. Pale vagina mucosa, poor estrogen effect, no discharge, no lesions, poor pelvic support, Grade III cystocele is noted.  Rectocele is  noted.  No cervical motion tenderness.  Uterus is freely mobile and non-fixed.  No adnexal/parametria masses or tenderness noted.  Anus and perineum are without rashes or lesions.    Skin: No rashes, bruises or suspicious lesions. Lymph: No cervical or inguinal adenopathy. Neurologic: Grossly intact, no focal deficits, moving all 4 extremities. Psychiatric: Normal mood and affect.  Laboratory Data: Lab Results  Component Value Date   WBC 6.7 07/30/2016   HGB 12.5 07/30/2016   HCT 37.4 07/30/2016   MCV 79.4 (L) 07/30/2016   PLT 196 07/30/2016   Lab Results  Component Value Date   CREATININE 0.83 07/30/2016   Lab Results  Component Value Date   HGBA1C 7.3 (H) 04/16/2016      Component Value Date/Time   CHOL 141 06/30/2011 0551   HDL 63 (H) 06/30/2011 0551   VLDL 14 06/30/2011 0551   LDLCALC 64 06/30/2011 0551   Lab Results  Component Value Date   AST 25 04/14/2016   Lab Results  Component Value Date   ALT 17 04/14/2016     Assessment & Plan:    1. History of recurrent UTI's  - No symptoms at this time  - Reviewed UTI preventative strategies  - Asked patient to contact our office for symptoms of an UTI   2. Vaginal atrophy  - patient advised to use one of the creams, she will use Premarin  3. Incontinence:   Patient's incontinence is well controlled on Myrbetriq 25 mg daily. She will continue that medication.  Refills are sent to her pharmacy.  She will follow up in one year for a PVR and UA.    4. Cystocele:   Manage conservatively at this time.  We will reassess when she returns in one year.    5. Nocturia  - encouraged the patient  to continue using  her CPAP machine  - stop fluids at 6 pm  - reduce dietary sodium  - continue the Myrbetriq   Return in about 1 year (around 09/14/2017) for PVR and OAB questionnaire.  These notes generated with voice recognition software. I apologize for typographical errors.  Zara Council, [facility] [address] [place], [place] [address] [phone]

## 2016-09-14 ENCOUNTER — Ambulatory Visit: Payer: Medicare Other | Admitting: Urology

## 2016-09-14 ENCOUNTER — Encounter: Payer: Self-pay | Admitting: Urology

## 2016-09-14 VITALS — BP 165/92 | HR 77 | Ht 61.0 in | Wt 211.0 lb

## 2016-09-14 DIAGNOSIS — R351 Nocturia: Secondary | ICD-10-CM

## 2016-09-14 DIAGNOSIS — N3946 Mixed incontinence: Secondary | ICD-10-CM

## 2016-09-14 DIAGNOSIS — Z87440 Personal history of urinary (tract) infections: Secondary | ICD-10-CM

## 2016-09-14 DIAGNOSIS — N8111 Cystocele, midline: Secondary | ICD-10-CM | POA: Diagnosis not present

## 2016-09-14 DIAGNOSIS — N952 Postmenopausal atrophic vaginitis: Secondary | ICD-10-CM

## 2016-09-20 ENCOUNTER — Encounter: Payer: Self-pay | Admitting: Cardiology

## 2016-09-20 ENCOUNTER — Encounter
Admission: RE | Disposition: A | Discharge status: Home / Self Care | Payer: Self-pay | Source: Ambulatory Visit | Attending: Cardiology

## 2016-09-20 ENCOUNTER — Ambulatory Visit
Admission: RE | Admit: 2016-09-20 | Discharge: 2016-09-20 | Disposition: A | Discharge status: Home / Self Care | Payer: Medicare Other | Source: Ambulatory Visit | Attending: Cardiology | Admitting: Cardiology

## 2016-09-20 DIAGNOSIS — E669 Obesity, unspecified: Secondary | ICD-10-CM | POA: Diagnosis not present

## 2016-09-20 DIAGNOSIS — I10 Essential (primary) hypertension: Secondary | ICD-10-CM | POA: Insufficient documentation

## 2016-09-20 DIAGNOSIS — E039 Hypothyroidism, unspecified: Secondary | ICD-10-CM | POA: Insufficient documentation

## 2016-09-20 DIAGNOSIS — Z79899 Other long term (current) drug therapy: Secondary | ICD-10-CM | POA: Diagnosis not present

## 2016-09-20 DIAGNOSIS — D649 Anemia, unspecified: Secondary | ICD-10-CM | POA: Diagnosis not present

## 2016-09-20 DIAGNOSIS — Z87440 Personal history of urinary (tract) infections: Secondary | ICD-10-CM | POA: Diagnosis not present

## 2016-09-20 DIAGNOSIS — Z794 Long term (current) use of insulin: Secondary | ICD-10-CM | POA: Diagnosis not present

## 2016-09-20 DIAGNOSIS — R0602 Shortness of breath: Secondary | ICD-10-CM | POA: Insufficient documentation

## 2016-09-20 DIAGNOSIS — Z888 Allergy status to other drugs, medicaments and biological substances status: Secondary | ICD-10-CM | POA: Insufficient documentation

## 2016-09-20 DIAGNOSIS — R5383 Other fatigue: Secondary | ICD-10-CM | POA: Insufficient documentation

## 2016-09-20 DIAGNOSIS — Z6839 Body mass index (BMI) 39.0-39.9, adult: Secondary | ICD-10-CM | POA: Diagnosis not present

## 2016-09-20 DIAGNOSIS — M4800 Spinal stenosis, site unspecified: Secondary | ICD-10-CM | POA: Diagnosis not present

## 2016-09-20 DIAGNOSIS — E119 Type 2 diabetes mellitus without complications: Secondary | ICD-10-CM | POA: Insufficient documentation

## 2016-09-20 DIAGNOSIS — M1990 Unspecified osteoarthritis, unspecified site: Secondary | ICD-10-CM | POA: Diagnosis not present

## 2016-09-20 DIAGNOSIS — Z95818 Presence of other cardiac implants and grafts: Secondary | ICD-10-CM

## 2016-09-20 DIAGNOSIS — Z9049 Acquired absence of other specified parts of digestive tract: Secondary | ICD-10-CM | POA: Diagnosis not present

## 2016-09-20 DIAGNOSIS — R55 Syncope and collapse: Secondary | ICD-10-CM | POA: Diagnosis not present

## 2016-09-20 DIAGNOSIS — Z7982 Long term (current) use of aspirin: Secondary | ICD-10-CM | POA: Diagnosis not present

## 2016-09-20 DIAGNOSIS — E785 Hyperlipidemia, unspecified: Secondary | ICD-10-CM | POA: Diagnosis not present

## 2016-09-20 DIAGNOSIS — Z8711 Personal history of peptic ulcer disease: Secondary | ICD-10-CM | POA: Insufficient documentation

## 2016-09-20 DIAGNOSIS — I2510 Atherosclerotic heart disease of native coronary artery without angina pectoris: Secondary | ICD-10-CM | POA: Insufficient documentation

## 2016-09-20 HISTORY — PX: LOOP RECORDER INSERTION: EP1214

## 2016-09-20 HISTORY — DX: Presence of other cardiac implants and grafts: Z95.818

## 2016-09-20 LAB — GLUCOSE, CAPILLARY: Glucose-Capillary: 112 mg/dL — ABNORMAL HIGH (ref 65–99)

## 2016-09-20 SURGERY — Surgical Case
Anesthesia: *Unknown

## 2016-09-20 SURGERY — LOOP RECORDER INSERTION
Anesthesia: Moderate Sedation

## 2016-09-20 MED ORDER — LIDOCAINE-EPINEPHRINE (PF) 1 %-1:200000 IJ SOLN
INTRAMUSCULAR | Status: AC
  Filled 2016-09-20: qty 30

## 2016-09-20 MED ORDER — LIDOCAINE HCL (PF) 1 % IJ SOLN
INTRAMUSCULAR | Status: AC
  Filled 2016-09-20: qty 30

## 2016-09-20 MED ORDER — SODIUM CHLORIDE 0.9 % IV SOLN: INTRAVENOUS | Status: DC

## 2016-09-20 MED ORDER — LIDOCAINE-EPINEPHRINE (PF) 1 %-1:200000 IJ SOLN
INTRAMUSCULAR | Status: DC | PRN
  Administered 2016-09-20: 10 mL

## 2016-09-20 MED ORDER — CEFAZOLIN SODIUM-DEXTROSE 2-4 GM/100ML-% IV SOLN: 2.0000 g | INTRAVENOUS | Status: DC

## 2016-09-20 SURGICAL SUPPLY — 2 items
LOOP REVEAL LINQSYS (Prosthesis & Implant Heart) ×2 IMPLANT
PACK LOOP INSERTION (CUSTOM PROCEDURE TRAY) ×2 IMPLANT

## 2016-09-20 NOTE — Op Note (Signed)
[facility] Cardiology   09/20/2016                     8:01 AM  PATIENT:  Brandi [name]    PRE-OPERATIVE DIAGNOSIS:  Link device implant  POST-OPERATIVE DIAGNOSIS:  Same  PROCEDURE:  Loop Recorder Insertion  SURGEON:  Isaias Cowman, MD    ANESTHESIA:     PREOPERATIVE INDICATIONS:  Brandi [name] is a  81 y.o. female with a diagnosis of Link device implant who failed conservative measures and elected for surgical management.    The risks benefits and alternatives were discussed with the patient preoperatively including but not limited to the risks of infection, bleeding, cardiopulmonary complications, the need for revision surgery, among others, and the patient was willing to proceed.   OPERATIVE PROCEDURE: Pectoral region was prepped and draped in usual sterile manner. The seizure was obtained 1% lidocaine locally. A 1 cm incision was performed with a puncture device. The Medtronic Reveal LINQ Loop recorder device was implanted utilizing the insertion device. Steri-Strips and a pressure dressing were applied.

## 2016-09-20 NOTE — Discharge Instructions (Signed)
Implantable Loop Recorder Placement, Care After Refer to this sheet in the next few weeks. These instructions provide you with information about caring for yourself after your procedure. Your health care provider may also give you more specific instructions. Your treatment has been planned according to current medical practices, but problems sometimes occur. Call your health care provider if you have any problems or questions after your procedure. What can I expect after the procedure? After the procedure, it is common to have:  Soreness or pain near the cut from surgery (incision).  Some swelling or bruising near the incision. Follow these instructions at home: Medicines   Take over-the-counter and prescription medicines only as told by your health care provider.  If you were prescribed an antibiotic medicine, take it as told by your health care provider. Do not stop taking the antibiotic even if you start to feel better. Bathing   Do not take baths, swim, or use a hot tub until your health care provider approves. Ask your health care provider if you can take showers. You may only be allowed to take sponge baths for bathing. Incision care   Follow instructions from your health care provider about how to take care of your incision. Make sure you:  Wash your hands with soap and water before you change your bandage (dressing). If soap and water are not available, use hand sanitizer.  Change your dressing as told by your health care provider.  Keep your dressing dry.  Leave stitches (sutures), skin glue, or adhesive strips in place. These skin closures may need to stay in place for 2 weeks or longer. If adhesive strip edges start to loosen and curl up, you may trim the loose edges. Do not remove adhesive strips completely unless your health care provider tells you to do that.  Check your incision area every day for signs of infection. Check for:  More redness, swelling, or pain.  Fluid  or blood.  Warmth.  Pus or a bad smell. Driving   If you received a sedative, do not drive for 24 hours after the procedure.  If you did not receive a sedative, ask your health care provider when it is safe to drive. Activity   Return to your normal activities as told by your health care provider. Ask your health care provider what activities are safe for you.  Until your health care provider says it is safe:  Do not lift anything that is heavier than 10 lb (4.5 kg).  Do not do activities that involve lifting your arms over your head. General instructions    Follow instructions from your health care provider about how and when to use your implantable loop recorder.  Do not go through a metal detection gate, and do not let someone hold a metal detector over your chest. Show your ID card.  Do not have an MRI unless you check with your health care provider first.  Do not use any tobacco products, such as cigarettes, chewing tobacco, and e-cigarettes. Tobacco can delay healing. If you need help quitting, ask your health care provider.  Keep all follow-up visits as told by your health care provider. This is important. Contact a health care provider if:  You have more redness, swelling, or pain around your incision.  You have more fluid or blood coming from your incision.  Your incision feels warm to the touch.  You have pus or a bad smell coming from your incision.  You have a fever.  You have pain that is not relieved by your pain medicine.  You have triggered your device because of fainting (syncope) or because of a heartbeat that feels like it is racing, slow, fluttering, or skipping (palpitations). Get help right away if:  You have chest pain.  You have difficulty breathing. This information is not intended to replace advice given to you by your health care provider. Make sure you discuss any questions you have with your health care provider. Document Released:  05/17/2015 Document Revised: 11/11/2015 Document Reviewed: 03/10/2015 Elsevier Interactive Patient Education  2017 [employer].

## 2016-12-27 ENCOUNTER — Other Ambulatory Visit: Payer: Self-pay | Admitting: Internal Medicine

## 2016-12-27 DIAGNOSIS — Z1231 Encounter for screening mammogram for malignant neoplasm of breast: Secondary | ICD-10-CM

## 2017-02-02 ENCOUNTER — Ambulatory Visit
Admission: RE | Admit: 2017-02-02 | Discharge: 2017-02-02 | Disposition: A | Discharge status: Home / Self Care | Payer: Medicare Other | Source: Ambulatory Visit | Attending: Internal Medicine | Admitting: Internal Medicine

## 2017-02-02 DIAGNOSIS — Z1231 Encounter for screening mammogram for malignant neoplasm of breast: Secondary | ICD-10-CM | POA: Insufficient documentation

## 2017-02-28 ENCOUNTER — Encounter: Payer: Self-pay | Admitting: *Deleted

## 2017-02-28 ENCOUNTER — Emergency Department
Admission: EM | Admit: 2017-02-28 | Discharge: 2017-02-28 | Disposition: A | Discharge status: Home / Self Care | Payer: Medicare Other | Attending: Emergency Medicine | Admitting: Emergency Medicine

## 2017-02-28 DIAGNOSIS — Z794 Long term (current) use of insulin: Secondary | ICD-10-CM | POA: Diagnosis not present

## 2017-02-28 DIAGNOSIS — E1165 Type 2 diabetes mellitus with hyperglycemia: Secondary | ICD-10-CM | POA: Diagnosis not present

## 2017-02-28 DIAGNOSIS — Z7982 Long term (current) use of aspirin: Secondary | ICD-10-CM | POA: Insufficient documentation

## 2017-02-28 DIAGNOSIS — I119 Hypertensive heart disease without heart failure: Secondary | ICD-10-CM | POA: Diagnosis not present

## 2017-02-28 DIAGNOSIS — Z96653 Presence of artificial knee joint, bilateral: Secondary | ICD-10-CM | POA: Insufficient documentation

## 2017-02-28 DIAGNOSIS — Z79899 Other long term (current) drug therapy: Secondary | ICD-10-CM | POA: Insufficient documentation

## 2017-02-28 DIAGNOSIS — R739 Hyperglycemia, unspecified: Secondary | ICD-10-CM | POA: Diagnosis present

## 2017-02-28 LAB — CBC
HCT: 37.8 % (ref 35.0–47.0)
Hemoglobin: 12.4 g/dL (ref 12.0–16.0)
MCH: 25.7 pg — AB (ref 26.0–34.0)
MCHC: 32.9 g/dL (ref 32.0–36.0)
MCV: 78.3 fL — ABNORMAL LOW (ref 80.0–100.0)
Platelets: 218 10*3/uL (ref 150–440)
RBC: 4.83 MIL/uL (ref 3.80–5.20)
RDW: 14.9 % — AB (ref 11.5–14.5)
WBC: 8.8 10*3/uL (ref 3.6–11.0)

## 2017-02-28 LAB — GLUCOSE, CAPILLARY
GLUCOSE-CAPILLARY: 446 mg/dL — AB (ref 65–99)
GLUCOSE-CAPILLARY: 351 mg/dL — AB (ref 65–99)

## 2017-02-28 LAB — BASIC METABOLIC PANEL
Anion gap: 11 (ref 5–15)
BUN: 18 mg/dL (ref 6–20)
CALCIUM: 9 mg/dL (ref 8.9–10.3)
CO2: 24 mmol/L (ref 22–32)
Chloride: 93 mmol/L — ABNORMAL LOW (ref 101–111)
Creatinine, Ser: 1.11 mg/dL — ABNORMAL HIGH (ref 0.44–1.00)
GFR calc Af Amer: 53 mL/min — ABNORMAL LOW (ref >60)
GFR, EST NON AFRICAN AMERICAN: 46 mL/min — AB (ref >60)
GLUCOSE: 462 mg/dL — AB (ref 65–99)
Potassium: 4 mmol/L (ref 3.5–5.1)
Sodium: 128 mmol/L — ABNORMAL LOW (ref 135–145)

## 2017-02-28 MED ORDER — INSULIN ASPART 100 UNIT/ML ~~LOC~~ SOLN
10.0000 [IU] | Freq: Once | SUBCUTANEOUS | Status: AC
  Administered 2017-02-28: 10 [IU] via SUBCUTANEOUS
  Filled 2017-02-28: qty 1

## 2017-02-28 NOTE — ED Notes (Signed)
ED Provider at bedside. 

## 2017-02-28 NOTE — ED Provider Notes (Signed)
[facility] Emergency Department Provider Note  ____________________________________________   First MD Initiated Contact with Patient 02/28/17 1740     (approximate)  I have reviewed the triage vital signs and the nursing notes.   HISTORY  Chief Complaint Hyperglycemia    HPI Brandi [name] is a 81 y.o. female who is sent to the emergency department from the [facility] for evaluation of hyperglycemia. The patient has a past medical history of diabetes mellitus and currently takes Lantus as well as Januvia and glipizide. One week ago she had a cortisone injection into her right hip and she is currently taking antibiotics for a sinus infection. She initially went to clinic today because she has had some polyuria and she is concerned that she may have a urinary tract infection. She denies dysuria or hesitancy. She denies fevers or chills. Her symptoms have been insidious in onset slowly progressive.   Past Medical History:  Diagnosis Date  . Anemia   . Anxiety   . Arthritis, degenerative   . Chronic cystitis   . Depression   . Diabetes mellitus without complication ([place])   . GERD (gastroesophageal reflux disease)   . Headache   . Heart disease   . HLD (hyperlipidemia)   . HTN (hypertension)   . Hypertension   . Incontinence in female   . Murmur   . Obesity   . Sleep apnea   . Urge incontinence of urine     Patient Active Problem List   Diagnosis Date Noted  . Severe sepsis ([place]) 04/14/2016  . Recurrent UTI 09/13/2015  . Cystocele, grade 3 09/13/2015  . Incontinence 09/13/2015    Past Surgical History:  Procedure Laterality Date  . BREAST BIOPSY Right 2004   neg  . BUNIONECTOMY    . CARDIAC SURGERY     Stent  . CHOLECYSTECTOMY    . JOINT REPLACEMENT     TKR Bilateral  . LOOP RECORDER INSERTION N/A 09/20/2016   Procedure: Loop Recorder Insertion;  Surgeon: Isaias Cowman, MD;  Location: [facility];  Service:  Cardiovascular;  Laterality: N/A;  . SEPTOPLASTY      Prior to Admission medications   Medication Sig Start Date End Date Taking? Authorizing Provider  aspirin EC 81 MG tablet Take 81 mg by mouth daily.    [provider]  budesonide-formoterol (SYMBICORT) 80-4.5 MCG/ACT inhaler Inhale 2 puffs into the lungs 2 (two) times daily.    [provider]  Calcium Carb-Cholecalciferol (CALCIUM + D3 PO) Take 1 tablet by mouth daily.    [provider]  carboxymethylcellulose (REFRESH TEARS) 0.5 % SOLN Apply 1-2 drops to eye at bedtime.     [provider]  carvedilol (COREG) 3.125 MG tablet Take 3.125 mg by mouth 2 (two) times daily with a meal.    [provider]  clopidogrel (PLAVIX) 75 MG tablet Take 75 mg by mouth daily.    [provider]  conjugated estrogens (PREMARIN) vaginal cream Place 1 Applicatorful vaginally daily. Apply 0.5mg  (pea-sized amount)  just inside the vaginal introitus with a finger-tip every night for two weeks and then Monday, Wednesday and Friday nights. 05/04/16   Zara Council A, PA-C  Cranberry 500 MG CHEW Chew 1 tablet by mouth daily.    [provider]  Cyanocobalamin (VITAMIN B-12 PO) Take 1 tablet by mouth daily.    [provider]  estradiol (ESTRACE VAGINAL) 0.1 MG/GM vaginal cream Apply 0.5mg  (pea-sized amount)  just inside  the vaginal introitus with a finger-tip every night for two weeks and then Monday, Wednesday and Friday nights. Patient not taking: Reported on 09/14/2016 05/04/16   Zara Council A, PA-C  ferrous sulfate 325 (65 FE) MG tablet Take 325 mg by mouth daily with breakfast.     [provider]  glipiZIDE (GLUCOTROL) 5 MG tablet Take 5 mg by mouth 2 (two) times daily.  08/19/13   [provider]  Insulin Glargine (LANTUS SOLOSTAR) 100 UNIT/ML Solostar Pen Inject 8 Units into the skin at bedtime.  07/20/15   [provider]  Insulin Pen Needle (FIFTY50 PEN  NEEDLES) 31G X 8 MM MISC Use once daily. 08/16/16 08/16/17  [provider]  meloxicam (MOBIC) 15 MG tablet Take 15 mg by mouth daily.    [provider]  mirabegron ER (MYRBETRIQ) 25 MG TB24 tablet Take 1 tablet (25 mg total) by mouth daily. 05/04/16   Zara Council A, PA-C  Multiple Vitamins-Minerals (PRESERVISION/LUTEIN) CAPS Take 1 capsule by mouth 2 (two) times daily.  03/12/12   [provider]  nortriptyline (PAMELOR) 25 MG capsule Take 25 mg by mouth at bedtime.     [provider]  ondansetron (ZOFRAN) 4 MG tablet Take 1 tablet (4 mg total) by mouth daily as needed for nausea or vomiting. 11/21/15   Earleen Newport, MD  pantoprazole (PROTONIX) 40 MG tablet Take 40 mg by mouth daily.    [provider]  polycarbophil (FIBER-CAPS) 625 MG tablet Take 625 mg by mouth daily.    [provider]  prasugrel (EFFIENT) 10 MG TABS tablet Take 10 mg by mouth daily.    [provider]  pregabalin (LYRICA) 75 MG capsule Take 75 mg by mouth 2 (two) times daily.    [provider]  simvastatin (ZOCOR) 40 MG tablet Take 20 mg by mouth daily at 6 PM.     [provider]  SitaGLIPtin-MetFORMIN HCl (JANUMET XR) 50-1000 MG TB24 Take 1 tablet by mouth 2 (two) times daily. 12/02/14   [provider]  valsartan (DIOVAN) 160 MG tablet Take 160 mg by mouth daily. Reported on 09/09/2015    [provider]  Verapamil HCl CR 300 MG CP24 Take 1 capsule by mouth daily.    [provider]    Allergies Micardis [telmisartan]; Accupril [quinapril hcl]; and Quinapril  Family History  Problem Relation Age of Onset  . Breast cancer Cousin   . Skin cancer Unknown   . Diabetes Unknown   . Hypercholesterolemia Unknown   . Hypertension Unknown   . Bladder Cancer Maternal Aunt   . COPD Unknown   . Kidney disease Neg Hx   . Kidney cancer Neg Hx     Social History Social History  Substance Use Topics  .  Smoking status: Never Smoker  . Smokeless tobacco: Never Used  . Alcohol use No    Review of Systems Constitutional: No fever/chills Eyes: No visual changes. ENT: No sore throat. Cardiovascular: Denies chest pain. Respiratory: Denies shortness of breath. Gastrointestinal: No abdominal pain.  No nausea, no vomiting.  No diarrhea.  No constipation. Genitourinary: Negative for dysuria. Musculoskeletal: Negative for back pain. Skin: Negative for rash. Neurological: Negative for headaches, focal weakness or numbness.   ____________________________________________   PHYSICAL EXAM:  VITAL SIGNS: ED Triage Vitals  Enc Vitals Group     BP 02/28/17 1706 (!) 177/62     Pulse Rate 02/28/17 1706 80     Resp 02/28/17 1706  16     Temp 02/28/17 1706 97.6 F (36.4 C)     Temp Source 02/28/17 1706 Oral     SpO2 02/28/17 1706 98 %     Weight 02/28/17 1708 209 lb 2 oz (94.9 kg)     Height 02/28/17 1708 5\' 1"  (1.549 m)     Head Circumference --      Peak Flow --      Pain Score --      Pain Loc --      Pain Edu? --      Excl. in [place]? --     Constitutional: Alert and oriented 4 jerking laughing well appearing nontoxic speaks in full clear sentences Eyes: PERRL EOMI. Head: Atraumatic. Nose: No congestion/rhinnorhea. Mouth/Throat: No trismus Neck: No stridor.   Cardiovascular: Normal rate, regular rhythm. Grossly normal heart sounds.  Good peripheral circulation. Respiratory: Normal respiratory effort.  No retractions. Lungs CTAB and moving good air Gastrointestinal: Soft nontender Musculoskeletal: No lower extremity edema   Neurologic:  Normal speech and language. No gross focal neurologic deficits are appreciated. Skin:  Skin is warm, dry and intact. No rash noted. Psychiatric: Mood and affect are normal. Speech and behavior are normal.    ____________________________________________   DIFFERENTIAL includes but not limited to  Dehydration, hyperglycemia, DKA,  HHS ____________________________________________   LABS (all labs ordered are listed, but only abnormal results are displayed)  Labs Reviewed  BASIC METABOLIC PANEL - Abnormal; Notable for the following:       Result Value   Sodium 128 (*)    Chloride 93 (*)    Glucose, Bld 462 (*)    Creatinine, Ser 1.11 (*)    GFR calc non Af Amer 46 (*)    GFR calc Af Amer 53 (*)    All other components within normal limits  CBC - Abnormal; Notable for the following:    MCV 78.3 (*)    MCH 25.7 (*)    RDW 14.9 (*)    All other components within normal limits  GLUCOSE, CAPILLARY - Abnormal; Notable for the following:    Glucose-Capillary 446 (*)    All other components within normal limits  URINALYSIS, COMPLETE (UACMP) WITH MICROSCOPIC  CBG MONITORING, ED    Elevated blood sugar with pseudohyponatremia__________________________________________  EKG   ____________________________________________  RADIOLOGY   ____________________________________________   PROCEDURES  Procedure(s) performed: no  Procedures  Critical Care performed: no  Observation: no ____________________________________________   INITIAL IMPRESSION / ASSESSMENT AND PLAN / ED COURSE  Pertinent labs & imaging results that were available during my care of the patient were reviewed by me and considered in my medical decision making (see chart for details).  The patient arrives hemodynamically stable and extremely well-appearing. At the time I saw her we had a chemistry back which did show hyperglycemia but no elevated anion gap. Her increase in her sugar is likely secondary to cortisol as well as her acute infection. I've given her a single dose of insulin to help cover her to get down but I will not increase her diabetic medications as I think she has 2 events to elevate her blood sugar which are not prominent. Strict return precautions are given.      ____________________________________________   FINAL  CLINICAL IMPRESSION(S) / ED DIAGNOSES  Final diagnoses:  Hyperglycemia      NEW MEDICATIONS STARTED DURING THIS VISIT:  New Prescriptions   No medications on file     Note:  This document was prepared  using Systems analyst and may include unintentional dictation errors.     Darel Hong, MD 02/28/17 1850

## 2017-02-28 NOTE — Discharge Instructions (Signed)
Please continue taking all of your medications as prescribed and follow-up with your primary care physician this coming Monday for reexamination. Return to the emergency department for any concerns.  It was a pleasure to take care of you today, and thank you for coming to our emergency department.  If you have any questions or concerns before leaving please ask the nurse to grab me and I'm more than happy to go through your aftercare instructions again.  If you were prescribed any opioid pain medication today such as Norco, Vicodin, Percocet, morphine, hydrocodone, or oxycodone please make sure you do not drive when you are taking this medication as it can alter your ability to drive safely.  If you have any concerns once you are home that you are not improving or are in fact getting worse before you can make it to your follow-up appointment, please do not hesitate to call 911 and come back for further evaluation.  Darel Hong, MD  Results for orders placed or performed during the hospital encounter of 22/29/79  Basic metabolic panel  Result Value Ref Range   Sodium 128 (L) 135 - 145 mmol/L   Potassium 4.0 3.5 - 5.1 mmol/L   Chloride 93 (L) 101 - 111 mmol/L   CO2 24 22 - 32 mmol/L   Glucose, Bld 462 (H) 65 - 99 mg/dL   BUN 18 6 - 20 mg/dL   Creatinine, Ser 1.11 (H) 0.44 - 1.00 mg/dL   Calcium 9.0 8.9 - 10.3 mg/dL   GFR calc non Af Amer 46 (L) >60 mL/min   GFR calc Af Amer 53 (L) >60 mL/min   Anion gap 11 5 - 15  CBC  Result Value Ref Range   WBC 8.8 3.6 - 11.0 K/uL   RBC 4.83 3.80 - 5.20 MIL/uL   Hemoglobin 12.4 12.0 - 16.0 g/dL   HCT 37.8 35.0 - 47.0 %   MCV 78.3 (L) 80.0 - 100.0 fL   MCH 25.7 (L) 26.0 - 34.0 pg   MCHC 32.9 32.0 - 36.0 g/dL   RDW 14.9 (H) 11.5 - 14.5 %   Platelets 218 150 - 440 K/uL  Glucose, capillary  Result Value Ref Range   Glucose-Capillary 446 (H) 65 - 99 mg/dL   Mm Screening Breast Tomo Bilateral  Result Date: 02/02/2017 CLINICAL DATA:  Screening.  EXAM: 2D DIGITAL SCREENING BILATERAL MAMMOGRAM WITH CAD AND ADJUNCT TOMO COMPARISON:  Previous exam(s). ACR Breast Density Category b: There are scattered areas of fibroglandular density. FINDINGS: There are no findings suspicious for malignancy. Images were processed with CAD. IMPRESSION: No mammographic evidence of malignancy. A result letter of this screening mammogram will be mailed directly to the patient. RECOMMENDATION: Screening mammogram in one year. (Code:SM-B-01Y) BI-RADS CATEGORY  1: Negative. Electronically Signed   By: Lajean Manes M.D.   On: 02/02/2017 12:22

## 2017-02-28 NOTE — ED Triage Notes (Signed)
Pt brought over from [facility] for eval of high blood sugar, states she had a cortisol shot last week and her sugar has been elevated since, states she takes insulin and janumet, awake and alert in no acute distress

## 2017-02-28 NOTE — ED Notes (Signed)
Pt verbalizes understanding of d/c teaching, pt in NAD at time of d/c, pt wheeled in lobby with family for discharge.

## 2017-03-14 DIAGNOSIS — E119 Type 2 diabetes mellitus without complications: Secondary | ICD-10-CM | POA: Insufficient documentation

## 2017-03-14 DIAGNOSIS — Z794 Long term (current) use of insulin: Secondary | ICD-10-CM

## 2017-03-14 DIAGNOSIS — R42 Dizziness and giddiness: Secondary | ICD-10-CM | POA: Insufficient documentation

## 2017-03-19 ENCOUNTER — Other Ambulatory Visit: Payer: Self-pay | Admitting: Orthopedic Surgery

## 2017-03-19 DIAGNOSIS — M5441 Lumbago with sciatica, right side: Secondary | ICD-10-CM

## 2017-04-06 ENCOUNTER — Ambulatory Visit
Admission: RE | Admit: 2017-04-06 | Discharge: 2017-04-06 | Disposition: A | Discharge status: Home / Self Care | Payer: Medicare Other | Source: Ambulatory Visit | Attending: Orthopedic Surgery | Admitting: Orthopedic Surgery

## 2017-04-06 DIAGNOSIS — M5136 Other intervertebral disc degeneration, lumbar region: Secondary | ICD-10-CM | POA: Diagnosis not present

## 2017-04-06 DIAGNOSIS — M4316 Spondylolisthesis, lumbar region: Secondary | ICD-10-CM | POA: Diagnosis not present

## 2017-04-06 DIAGNOSIS — M461 Sacroiliitis, not elsewhere classified: Secondary | ICD-10-CM | POA: Diagnosis present

## 2017-04-06 DIAGNOSIS — M48061 Spinal stenosis, lumbar region without neurogenic claudication: Secondary | ICD-10-CM | POA: Diagnosis not present

## 2017-04-06 DIAGNOSIS — M5441 Lumbago with sciatica, right side: Secondary | ICD-10-CM

## 2017-05-30 ENCOUNTER — Other Ambulatory Visit: Payer: Self-pay | Admitting: Urology

## 2017-05-30 DIAGNOSIS — N3946 Mixed incontinence: Secondary | ICD-10-CM

## 2017-05-31 NOTE — Telephone Encounter (Signed)
Patient is requesting a refill on her Myrbetriq, but there is an interaction between the Myrbetriq and her Pamelor.  It states it could cause heart arrhythmias.  Is she still taking the Pamelor?

## 2017-06-08 ENCOUNTER — Encounter: Payer: Self-pay | Admitting: *Deleted

## 2017-06-14 ENCOUNTER — Ambulatory Visit
Admission: RE | Admit: 2017-06-14 | Discharge: 2017-06-14 | Disposition: A | Discharge status: Home / Self Care | Payer: Medicare Other | Source: Ambulatory Visit | Attending: Gastroenterology | Admitting: Gastroenterology

## 2017-06-14 ENCOUNTER — Ambulatory Visit: Payer: Medicare Other | Admitting: Anesthesiology

## 2017-06-14 ENCOUNTER — Encounter
Admission: RE | Disposition: A | Discharge status: Home / Self Care | Payer: Self-pay | Source: Ambulatory Visit | Attending: Gastroenterology

## 2017-06-14 ENCOUNTER — Encounter: Payer: Self-pay | Admitting: *Deleted

## 2017-06-14 DIAGNOSIS — E119 Type 2 diabetes mellitus without complications: Secondary | ICD-10-CM | POA: Diagnosis not present

## 2017-06-14 DIAGNOSIS — Z7982 Long term (current) use of aspirin: Secondary | ICD-10-CM | POA: Diagnosis not present

## 2017-06-14 DIAGNOSIS — Q438 Other specified congenital malformations of intestine: Secondary | ICD-10-CM | POA: Diagnosis not present

## 2017-06-14 DIAGNOSIS — M1990 Unspecified osteoarthritis, unspecified site: Secondary | ICD-10-CM | POA: Insufficient documentation

## 2017-06-14 DIAGNOSIS — Z79899 Other long term (current) drug therapy: Secondary | ICD-10-CM | POA: Insufficient documentation

## 2017-06-14 DIAGNOSIS — I10 Essential (primary) hypertension: Secondary | ICD-10-CM | POA: Diagnosis not present

## 2017-06-14 DIAGNOSIS — F419 Anxiety disorder, unspecified: Secondary | ICD-10-CM | POA: Insufficient documentation

## 2017-06-14 DIAGNOSIS — K640 First degree hemorrhoids: Secondary | ICD-10-CM | POA: Diagnosis not present

## 2017-06-14 DIAGNOSIS — K219 Gastro-esophageal reflux disease without esophagitis: Secondary | ICD-10-CM | POA: Insufficient documentation

## 2017-06-14 DIAGNOSIS — Z6841 Body Mass Index (BMI) 40.0 and over, adult: Secondary | ICD-10-CM | POA: Diagnosis not present

## 2017-06-14 DIAGNOSIS — F329 Major depressive disorder, single episode, unspecified: Secondary | ICD-10-CM | POA: Insufficient documentation

## 2017-06-14 DIAGNOSIS — D124 Benign neoplasm of descending colon: Secondary | ICD-10-CM | POA: Insufficient documentation

## 2017-06-14 DIAGNOSIS — Z1211 Encounter for screening for malignant neoplasm of colon: Secondary | ICD-10-CM | POA: Diagnosis present

## 2017-06-14 DIAGNOSIS — R011 Cardiac murmur, unspecified: Secondary | ICD-10-CM | POA: Insufficient documentation

## 2017-06-14 DIAGNOSIS — Z794 Long term (current) use of insulin: Secondary | ICD-10-CM | POA: Diagnosis not present

## 2017-06-14 DIAGNOSIS — Z86010 Personal history of colonic polyps: Secondary | ICD-10-CM | POA: Insufficient documentation

## 2017-06-14 DIAGNOSIS — E785 Hyperlipidemia, unspecified: Secondary | ICD-10-CM | POA: Insufficient documentation

## 2017-06-14 DIAGNOSIS — G4730 Sleep apnea, unspecified: Secondary | ICD-10-CM | POA: Insufficient documentation

## 2017-06-14 DIAGNOSIS — D122 Benign neoplasm of ascending colon: Secondary | ICD-10-CM | POA: Diagnosis not present

## 2017-06-14 DIAGNOSIS — E669 Obesity, unspecified: Secondary | ICD-10-CM | POA: Diagnosis not present

## 2017-06-14 DIAGNOSIS — Z888 Allergy status to other drugs, medicaments and biological substances status: Secondary | ICD-10-CM | POA: Diagnosis not present

## 2017-06-14 DIAGNOSIS — Z7902 Long term (current) use of antithrombotics/antiplatelets: Secondary | ICD-10-CM | POA: Diagnosis not present

## 2017-06-14 HISTORY — PX: COLONOSCOPY WITH PROPOFOL: SHX5780

## 2017-06-14 LAB — GLUCOSE, CAPILLARY: Glucose-Capillary: 105 mg/dL — ABNORMAL HIGH (ref 65–99)

## 2017-06-14 SURGERY — COLONOSCOPY WITH PROPOFOL
Anesthesia: General

## 2017-06-14 MED ORDER — PROPOFOL 10 MG/ML IV BOLUS
INTRAVENOUS | Status: DC | PRN
  Administered 2017-06-14: 30 mg via INTRAVENOUS

## 2017-06-14 MED ORDER — SODIUM CHLORIDE 0.9 % IV SOLN: INTRAVENOUS | Status: DC

## 2017-06-14 MED ORDER — PROPOFOL 10 MG/ML IV BOLUS
INTRAVENOUS | Status: AC
  Filled 2017-06-14: qty 20

## 2017-06-14 MED ORDER — PROPOFOL 500 MG/50ML IV EMUL
INTRAVENOUS | Status: DC | PRN
  Administered 2017-06-14: 120 ug/kg/min via INTRAVENOUS

## 2017-06-14 MED ORDER — SODIUM CHLORIDE 0.9 % IV SOLN
INTRAVENOUS | Status: DC
  Administered 2017-06-14: 08:00:00 via INTRAVENOUS

## 2017-06-14 MED ORDER — LIDOCAINE HCL (PF) 2 % IJ SOLN
INTRAMUSCULAR | Status: AC
  Filled 2017-06-14: qty 10

## 2017-06-14 MED ORDER — SODIUM CHLORIDE 0.9 % IJ SOLN
INTRAMUSCULAR | Status: DC | PRN
  Administered 2017-06-14: 5 mL

## 2017-06-14 NOTE — H&P (Signed)
Outpatient short stay form Pre-procedure 06/14/2017 8:14 AM Brandi Sails MD  Primary Physician: Dr. Harrel Lemon  Reason for visit:  Colonoscopy  History of present illness:  Patient is a 81 year old female presenting today as above. Chest personal history of adenomatous colon polyps. She tolerated her prep well. She does take Plavix and aspirin has not taken aspirin for over a week. Her last Plavix was on December 20. She takes no other aspirin or blood thinning agents.    Current Facility-Administered Medications:  .  0.9 %  sodium chloride infusion, , Intravenous, Continuous, Brandi Sails, MD .  0.9 %  sodium chloride infusion, , Intravenous, Continuous, Brandi Sails, MD .  0.9 %  sodium chloride infusion, , Intravenous, Continuous, Brandi Sails, MD  Medications Prior to Admission  Medication Sig Dispense Refill Last Dose  . aspirin EC 81 MG tablet Take 81 mg by mouth daily.   Past Month at Unknown time  . budesonide-formoterol (SYMBICORT) 80-4.5 MCG/ACT inhaler Inhale 2 puffs into the lungs 2 (two) times daily.   06/13/2017 at Unknown time  . Calcium Carb-Cholecalciferol (CALCIUM + D3 PO) Take 1 tablet by mouth daily.   Past Week at Unknown time  . carvedilol (COREG) 3.125 MG tablet Take 3.125 mg by mouth 2 (two) times daily with a meal.   06/13/2017 at Unknown time  . Cranberry 500 MG CHEW Chew 1 tablet by mouth daily.   Past Week at Unknown time  . Cyanocobalamin (VITAMIN B-12 PO) Take 1 tablet by mouth daily.   Past Week at Unknown time  . glipiZIDE (GLUCOTROL) 5 MG tablet Take 5 mg by mouth 2 (two) times daily.    06/13/2017 at Unknown time  . Insulin Glargine (LANTUS SOLOSTAR) 100 UNIT/ML Solostar Pen Inject 8 Units into the skin at bedtime.    06/13/2017 at Unknown time  . meloxicam (MOBIC) 15 MG tablet Take 15 mg by mouth daily.   06/13/2017 at Unknown time  . mirabegron ER (MYRBETRIQ) 25 MG TB24 tablet Take 1 tablet (25 mg total) by mouth daily. 90  tablet 4 06/13/2017 at Unknown time  . Multiple Vitamins-Minerals (PRESERVISION/LUTEIN) CAPS Take 1 capsule by mouth 2 (two) times daily.    Past Week at Unknown time  . nortriptyline (PAMELOR) 25 MG capsule Take 25 mg by mouth at bedtime.    06/13/2017 at Unknown time  . pantoprazole (PROTONIX) 40 MG tablet Take 40 mg by mouth daily.   06/13/2017 at Unknown time  . polycarbophil (FIBER-CAPS) 625 MG tablet Take 625 mg by mouth daily.   Past Week at Unknown time  . pregabalin (LYRICA) 75 MG capsule Take 75 mg by mouth 2 (two) times daily.   06/13/2017 at Unknown time  . simvastatin (ZOCOR) 40 MG tablet Take 20 mg by mouth daily at 6 PM.    06/13/2017 at Unknown time  . SitaGLIPtin-MetFORMIN HCl (JANUMET XR) 50-1000 MG TB24 Take 1 tablet by mouth 2 (two) times daily.   06/13/2017 at Unknown time  . Verapamil HCl CR 300 MG CP24 Take 1 capsule by mouth daily.   06/13/2017 at Unknown time  . carboxymethylcellulose (REFRESH TEARS) 0.5 % SOLN Apply 1-2 drops to eye at bedtime.    09/19/2016 at Unknown time  . clopidogrel (PLAVIX) 75 MG tablet Take 75 mg by mouth daily.   06/07/2017  . conjugated estrogens (PREMARIN) vaginal cream Place 1 Applicatorful vaginally daily. Apply 0.5mg  (pea-sized amount)  just inside the vaginal introitus with a finger-tip  every night for two weeks and then Monday, Wednesday and Friday nights. 30 g 12 Taking  . estradiol (ESTRACE VAGINAL) 0.1 MG/GM vaginal cream Apply 0.5mg  (pea-sized amount)  just inside the vaginal introitus with a finger-tip every night for two weeks and then Monday, Wednesday and Friday nights. (Patient not taking: Reported on 09/14/2016) 30 g 12 Not Taking at Unknown time  . ferrous sulfate 325 (65 FE) MG tablet Take 325 mg by mouth daily with breakfast.    Not Taking at Unknown time  . Insulin Pen Needle (FIFTY50 PEN NEEDLES) 31G X 8 MM MISC Use once daily.   Taking  . ondansetron (ZOFRAN) 4 MG tablet Take 1 tablet (4 mg total) by mouth daily as needed for  nausea or vomiting. 20 tablet 1 Taking  . prasugrel (EFFIENT) 10 MG TABS tablet Take 10 mg by mouth daily.   Not Taking at Unknown time  . valsartan (DIOVAN) 160 MG tablet Take 160 mg by mouth daily. Reported on 09/09/2015   Not Taking at Unknown time     Allergies  Allergen Reactions  . Micardis [Telmisartan] Other (See Comments)    Dizziness  . Accupril [Quinapril Hcl] Rash  . Quinapril Rash     Past Medical History:  Diagnosis Date  . Anemia   . Anxiety   . Arthritis, degenerative   . Chronic cystitis   . Depression   . Diabetes mellitus without complication ([place])   . GERD (gastroesophageal reflux disease)   . Headache   . Heart disease   . HLD (hyperlipidemia)   . HTN (hypertension)   . Hypertension   . Incontinence in female   . Murmur   . Obesity   . Sleep apnea   . Urge incontinence of urine     Review of systems:      Physical Exam    Heart and lungs: Regular rate and rhythm without rub or gallop, lungs are bilaterally clear.    HEENT: Normocephalic atraumatic eyes are anicteric    Other:     Pertinant exam for procedure: Soft nontender nondistended bowel sounds positive normoactive.  Planned proceedures: Colonoscopy and indicated procedures. I have discussed the risks benefits and complications of procedures to include not limited to bleeding, infection, perforation and the risk of sedation and the patient wishes to proceed.    Brandi Sails, MD Gastroenterology 06/14/2017  8:14 AM

## 2017-06-14 NOTE — Transfer of Care (Signed)
Immediate Anesthesia Transfer of Care Note  Patient: Brandi [name]  Procedure(s) Performed: COLONOSCOPY WITH PROPOFOL (N/A )  Patient Location: PACU  Anesthesia Type:General  Level of Consciousness: awake  Airway & Oxygen Therapy: Patient Spontanous Breathing and Patient connected to nasal cannula oxygen  Post-op Assessment: Report given to RN and Post -op Vital signs reviewed and stable  Post vital signs: Reviewed  Last Vitals:  Vitals:   06/14/17 0756  BP: (!) 172/110  Pulse: 92  Resp: 18  Temp: (!) 35.6 C  SpO2: 98%    Last Pain:  Vitals:   06/14/17 0756  TempSrc: Tympanic         Complications: No apparent anesthesia complications

## 2017-06-14 NOTE — Anesthesia Postprocedure Evaluation (Signed)
Anesthesia Post Note  Patient: Brandi [name]  Procedure(s) Performed: COLONOSCOPY WITH PROPOFOL (N/A )  Patient location during evaluation: PACU Anesthesia Type: General Level of consciousness: awake Pain management: pain level controlled Vital Signs Assessment: post-procedure vital signs reviewed and stable Respiratory status: spontaneous breathing Cardiovascular status: stable Anesthetic complications: no     Last Vitals:  Vitals:   06/14/17 1010 06/14/17 1020  BP: (!) 148/74 (!) 184/76  Pulse: 80 76  Resp: 15 16  Temp:    SpO2: 100% 100%    Last Pain:  Vitals:   06/14/17 0951  TempSrc: Tympanic                 VAN STAVEREN,Kazuo Durnil

## 2017-06-14 NOTE — Op Note (Signed)
[facility] Gastroenterology Patient Name: Brandi [name] Procedure Date: 06/14/2017 8:46 AM MRN: [id number] Account #: [id number] Date of Birth: April 23, 1936 Admit Type: Outpatient Age: 81 Room: [facility] ENDO ROOM 1 Gender: Female Note Status: Finalized Procedure:            Colonoscopy Indications:          Personal history of colonic polyps Providers:            Lollie Sails, MD Referring MD:         Baxter Hire, MD (Referring MD) Medicines:            Monitored Anesthesia Care Complications:        No immediate complications. Procedure:            Pre-Anesthesia Assessment:                       - ASA Grade Assessment: III - A patient with severe                        systemic disease.                       After obtaining informed consent, the colonoscope was                        passed under direct vision. Throughout the procedure,                        the patient's blood pressure, pulse, and oxygen                        saturations were monitored continuously. The                        Colonoscope was introduced through the anus and                        advanced to the the cecum, identified by appendiceal                        orifice and ileocecal valve. The colonoscopy was                        unusually difficult due to significant looping and a                        tortuous colon. Successful completion of the procedure                        was aided by changing the patient to a supine position,                        changing the patient to a prone position, using manual                        pressure and withdrawing and reinserting the scope. The                        patient tolerated the procedure well. The quality of  the bowel preparation was fair. Findings:      A 3 mm polyp was found in the proximal descending colon. The polyp was       sessile. The polyp was removed with a cold biopsy forceps. Resection  and       retrieval were complete.      A 11 mm polyp was found in the proximal ascending colon. The polyp was       sessile. The polyp was removed with a saline injection-lift technique       using a cold snare and cold forcep. Resection and retrieval were       complete.      Non-bleeding internal hemorrhoids were found during anoscopy. The       hemorrhoids were small and Grade I (internal hemorrhoids that do not       prolapse).      The digital rectal exam was normal. Impression:           - Preparation of the colon was fair.                       - One 3 mm polyp in the proximal descending colon,                        removed with a cold biopsy forceps. Resected and                        retrieved.                       - One 11 mm polyp in the proximal ascending colon,                        removed using injection-lift and a cold snare. Resected                        and retrieved.                       - Non-bleeding internal hemorrhoids. Recommendation:       - Discharge patient to home.                       - Low residue diet today. Procedure Code(s):    --- Professional ---                       [phone], Colonoscopy, flexible; with removal of tumor(s),                        polyp(s), or other lesion(s) by snare technique                       45380, 59, Colonoscopy, flexible; with biopsy, single                        or multiple                       45381, Colonoscopy, flexible; with directed submucosal                        injection(s), any substance Diagnosis Code(s):    --- Professional ---  K64.0, First degree hemorrhoids                       D12.4, Benign neoplasm of descending colon                       D12.2, Benign neoplasm of ascending colon                       Z86.010, Personal history of colonic polyps CPT copyright 2016 [employer]. All rights reserved. The codes documented in this report are preliminary and  upon coder review may  be revised to meet current compliance requirements. Lollie Sails, MD 06/14/2017 9:51:11 AM This report has been signed electronically. Number of Addenda: 0 Note Initiated On: 06/14/2017 8:46 AM Scope Withdrawal Time: 0 hours 15 minutes 4 seconds  Total Procedure Duration: 0 hours 45 minutes 51 seconds       [facility]

## 2017-06-14 NOTE — Anesthesia Preprocedure Evaluation (Signed)
Anesthesia Evaluation  Patient identified by MRN, date of birth, ID band Patient awake    Reviewed: Allergy & Precautions, NPO status , Patient's Chart, lab work & pertinent test results  Airway Mallampati: III       Dental  (+) Teeth Intact, Caps   Pulmonary sleep apnea ,    breath sounds clear to auscultation       Cardiovascular Exercise Tolerance: Good hypertension, Pt. on medications and Pt. on home beta blockers  Rhythm:Regular     Neuro/Psych  Headaches, Anxiety    GI/Hepatic Neg liver ROS, GERD  ,  Endo/Other  diabetes, Type 1  Renal/GU negative Renal ROS     Musculoskeletal   Abdominal (+) + obese,   Peds negative pediatric ROS (+)  Hematology  (+) anemia ,   Anesthesia Other Findings   Reproductive/Obstetrics                             Anesthesia Physical Anesthesia Plan  ASA: III  Anesthesia Plan: General   Post-op Pain Management:    Induction: Intravenous  PONV Risk Score and Plan:   Airway Management Planned: Natural Airway and Nasal Cannula  Additional Equipment:   Intra-op Plan:   Post-operative Plan:   Informed Consent: I have reviewed the patients History and Physical, chart, labs and discussed the procedure including the risks, benefits and alternatives for the proposed anesthesia with the patient or authorized representative who has indicated his/her understanding and acceptance.     Plan Discussed with: Surgeon  Anesthesia Plan Comments:         Anesthesia Quick Evaluation

## 2017-06-14 NOTE — Anesthesia Post-op Follow-up Note (Signed)
Anesthesia QCDR form completed.        

## 2017-06-15 ENCOUNTER — Encounter: Payer: Self-pay | Admitting: Gastroenterology

## 2017-06-15 LAB — SURGICAL PATHOLOGY

## 2017-06-19 HISTORY — PX: CORONARY ARTERY BYPASS GRAFT: SHX141

## 2017-06-20 ENCOUNTER — Other Ambulatory Visit: Payer: Self-pay

## 2017-06-20 DIAGNOSIS — N3946 Mixed incontinence: Secondary | ICD-10-CM

## 2017-06-20 MED ORDER — MIRABEGRON ER 25 MG PO TB24: ORAL_TABLET | ORAL | 0 refills | Status: DC

## 2017-09-12 NOTE — Progress Notes (Deleted)
09/13/2017 9:53 PM   Brandi [name] 04/28/1936 [id number]  Referring provider: Madelyn Brunner, MD No address on file  No chief complaint on file.   HPI: 82 yo WF with history of recurrent UTI's, incontinence, vaginal atrophy, cystocele and nocturia presents for a yearly follow up.   Recurrent UTI's Patient with greater than 3 urinary tract infections over the last year. Her risk factor for recurrent UTIs vaginal atrophy, incontinence and age.   Vaginal atrophy Patient is applying the vaginal cream 3 nights weekly.  She was applying both the Estrace and Premarin cream.  I explained to her that she only needed to use one of the creams.  The two prescriptions were given, so that she could see what brand of estrogen cream her insurance covered.  NOT TO USE BOTH CREAMS.      Incontinence She is experiencing urgency x 4-7, frequency x 8, incontinence x 0-3 and nocturia x 0-3.  She finds the Myrbetriq helpful in controlling her urinary incontinence.   Nocturia Patient is using her CPAP machine.  She is having nocturia x 0-3.    Cystocele Found incidentally on exam. Patient denies any vaginal bulge.   PMH: Past Medical History:  Diagnosis Date  . Anemia   . Anxiety   . Arthritis, degenerative   . Chronic cystitis   . Depression   . Diabetes mellitus without complication ([place])   . GERD (gastroesophageal reflux disease)   . Headache   . Heart disease   . HLD (hyperlipidemia)   . HTN (hypertension)   . Hypertension   . Incontinence in female   . Murmur   . Obesity   . Sleep apnea   . Urge incontinence of urine     Surgical History: Past Surgical History:  Procedure Laterality Date  . BREAST BIOPSY Right 2004   neg  . BUNIONECTOMY    . CARDIAC SURGERY     Stent  . CHOLECYSTECTOMY    . COLONOSCOPY WITH PROPOFOL N/A 06/14/2017   Procedure: COLONOSCOPY WITH PROPOFOL;  Surgeon: Lollie Sails, MD;  Location[facility] ENDOSCOPY;  Service: Endoscopy;  Laterality:  N/A;  . JOINT REPLACEMENT     TKR Bilateral  . LOOP RECORDER INSERTION N/A 09/20/2016   Procedure: Loop Recorder Insertion;  Surgeon: Isaias Cowman, MD;  Location: [place] [facility];  Service: Cardiovascular;  Laterality: N/A;  . SEPTOPLASTY      Home Medications:  Allergies as of 09/13/2017      Reactions   Micardis [telmisartan] Other (See Comments)   Dizziness   Accupril [quinapril Hcl] Rash   Quinapril Rash      Medication List        Accurate as of 09/12/17  9:53 PM. Always use your most recent med list.          aspirin EC 81 MG tablet Take 81 mg by mouth daily.   budesonide-formoterol 80-4.5 MCG/ACT inhaler Commonly known as:  SYMBICORT Inhale 2 puffs into the lungs 2 (two) times daily.   CALCIUM + D3 PO Take 1 tablet by mouth daily.   carvedilol 3.125 MG tablet Commonly known as:  COREG Take 3.125 mg by mouth 2 (two) times daily with a meal.   clopidogrel 75 MG tablet Commonly known as:  PLAVIX Take 75 mg by mouth daily.   conjugated estrogens vaginal cream Commonly known as:  PREMARIN Place 1 Applicatorful vaginally daily. Apply 0.5mg  (pea-sized amount)  just inside the vaginal introitus with a  finger-tip every night for two weeks and then Monday, Wednesday and Friday nights.   Cranberry 500 MG Chew Chew 1 tablet by mouth daily.   estradiol 0.1 MG/GM vaginal cream Commonly known as:  ESTRACE VAGINAL Apply 0.5mg  (pea-sized amount)  just inside the vaginal introitus with a finger-tip every night for two weeks and then Monday, Wednesday and Friday nights.   ferrous sulfate 325 (65 FE) MG tablet Take 325 mg by mouth daily with breakfast.   FIBER-CAPS 625 MG tablet Generic drug:  polycarbophil Take 625 mg by mouth daily.   glipiZIDE 5 MG tablet Commonly known as:  GLUCOTROL Take 5 mg by mouth 2 (two) times daily.   JANUMET XR 50-1000 MG Tb24 Generic drug:  SitaGLIPtin-MetFORMIN HCl Take 1 tablet by mouth 2 (two) times daily.   LANTUS  SOLOSTAR 100 UNIT/ML Solostar Pen Generic drug:  Insulin Glargine Inject 8 Units into the skin at bedtime.   meloxicam 15 MG tablet Commonly known as:  MOBIC Take 15 mg by mouth daily.   mirabegron ER 25 MG Tb24 tablet Commonly known as:  MYRBETRIQ TAKE 1 TABLET(25 MG) BY MOUTH DAILY   nortriptyline 25 MG capsule Commonly known as:  PAMELOR Take 25 mg by mouth at bedtime.   ondansetron 4 MG tablet Commonly known as:  ZOFRAN Take 1 tablet (4 mg total) by mouth daily as needed for nausea or vomiting.   pantoprazole 40 MG tablet Commonly known as:  PROTONIX Take 40 mg by mouth daily.   prasugrel 10 MG Tabs tablet Commonly known as:  EFFIENT Take 10 mg by mouth daily.   pregabalin 75 MG capsule Commonly known as:  LYRICA Take 75 mg by mouth 2 (two) times daily.   PRESERVISION/LUTEIN Caps Take 1 capsule by mouth 2 (two) times daily.   REFRESH TEARS 0.5 % Soln Generic drug:  carboxymethylcellulose Apply 1-2 drops to eye at bedtime.   simvastatin 40 MG tablet Commonly known as:  ZOCOR Take 20 mg by mouth daily at 6 PM.   valsartan 160 MG tablet Commonly known as:  DIOVAN Take 160 mg by mouth daily. Reported on 09/09/2015   Verapamil HCl CR 300 MG Cp24 Take 1 capsule by mouth daily.   VITAMIN B-12 PO Take 1 tablet by mouth daily.       Allergies:  Allergies  Allergen Reactions  . Micardis [Telmisartan] Other (See Comments)    Dizziness  . Accupril [Quinapril Hcl] Rash  . Quinapril Rash    Family History: Family History  Problem Relation Age of Onset  . Breast cancer Cousin   . Skin cancer Unknown   . Diabetes Unknown   . Hypercholesterolemia Unknown   . Hypertension Unknown   . Bladder Cancer Maternal Aunt   . COPD Unknown   . Kidney disease Neg Hx   . Kidney cancer Neg Hx     Social History:  reports that she has never smoked. She has never used smokeless tobacco. She reports that she does not drink alcohol or use drugs.  ROS:                                          Physical Exam: There were no vitals taken for this visit.  Constitutional: Well nourished. Alert and oriented, No acute distress. HEENT: [place] AT, moist mucus membranes. Trachea midline, no masses. Cardiovascular: No clubbing, cyanosis, or edema. Respiratory: Normal  respiratory effort, no increased work of breathing. GI: Abdomen is soft, non tender, non distended, no abdominal masses. Liver and spleen not palpable.  No hernias appreciated.  Stool sample for occult testing is not indicated.   GU: No CVA tenderness.  No bladder fullness or masses.  Atrophic external genitalia, normal pubic hair distribution, no lesions.  Normal urethral meatus, no lesions, no prolapse, no discharge.   Urethral caruncle.   No bladder fullness, tenderness or masses. Pale vagina mucosa, poor estrogen effect, no discharge, no lesions, poor pelvic support, Grade III cystocele is noted.  Rectocele is  noted.  No cervical motion tenderness.  Uterus is freely mobile and non-fixed.  No adnexal/parametria masses or tenderness noted.  Anus and perineum are without rashes or lesions.    Skin: No rashes, bruises or suspicious lesions. Lymph: No cervical or inguinal adenopathy. Neurologic: Grossly intact, no focal deficits, moving all 4 extremities. Psychiatric: Normal mood and affect.  Constitutional: Well nourished. Alert and oriented, No acute distress. HEENT: [place] AT, moist mucus membranes. Trachea midline, no masses. Cardiovascular: No clubbing, cyanosis, or edema. Respiratory: Normal respiratory effort, no increased work of breathing. GI: Abdomen is soft, non tender, non distended, no abdominal masses. Liver and spleen not palpable.  No hernias appreciated.  Stool sample for occult testing is not indicated.   GU: No CVA tenderness.  No bladder fullness or masses.  Atrophic external genitalia, normal pubic hair distribution, no lesions.  Normal urethral meatus, no lesions, no  prolapse, no discharge.   Urethral caruncle.  No bladder fullness, tenderness or masses. Pale vagina mucosa, poor estrogen effect, no discharge, no lesions, poor pelvic support, Grade III cystocele is noted.  Rectocele noted.  No cervical motion tenderness.  Uterus is freely mobile and non-fixed.  No adnexal/parametria masses or tenderness noted.  Anus and perineum are without rashes or lesions.   *** Skin: No rashes, bruises or suspicious lesions. Lymph: No cervical or inguinal adenopathy. Neurologic: Grossly intact, no focal deficits, moving all 4 extremities. Psychiatric: Normal mood and affect.   Laboratory Data: Lab Results  Component Value Date   WBC 8.8 02/28/2017   HGB 12.4 02/28/2017   HCT 37.8 02/28/2017   MCV 78.3 (L) 02/28/2017   PLT 218 02/28/2017   Lab Results  Component Value Date   CREATININE 1.11 (H) 02/28/2017   Lab Results  Component Value Date   HGBA1C 7.3 (H) 04/16/2016      Component Value Date/Time   CHOL 141 06/30/2011 0551   HDL 63 (H) 06/30/2011 0551   VLDL 14 06/30/2011 0551   LDLCALC 64 06/30/2011 0551   Lab Results  Component Value Date   AST 25 04/14/2016   Lab Results  Component Value Date   ALT 17 04/14/2016   I have reviewed the labs.  Assessment & Plan:    1. History of recurrent UTI's  - No symptoms at this time  - Reviewed UTI preventative strategies  - Asked patient to contact our office for symptoms of an UTI   2. Vaginal atrophy  - patient advised to use one of the creams, she will use Premarin  3. Incontinence:   Patient's incontinence is well controlled on Myrbetriq 25 mg daily. She will continue that medication.  Refills are sent to her pharmacy.  She will follow up in one year for a PVR and UA.    4. Cystocele:   Manage conservatively at this time.  We will reassess when she returns in one year.  5. Nocturia  - encouraged the patient to continue using her CPAP machine  - stop fluids at 6 pm  - reduce dietary  sodium  - continue the Myrbetriq   No follow-ups on file.  These notes generated with voice recognition software. I apologize for typographical errors.  Zara Council, [facility] [address], [place], [place] [address] [phone]

## 2017-09-13 ENCOUNTER — Encounter: Payer: Self-pay | Admitting: Urology

## 2017-09-13 ENCOUNTER — Ambulatory Visit: Payer: Medicare Other | Admitting: Urology

## 2017-09-20 ENCOUNTER — Encounter: Payer: Self-pay | Admitting: Urology

## 2017-09-20 ENCOUNTER — Ambulatory Visit: Payer: Medicare Other | Admitting: Urology

## 2017-09-20 VITALS — BP 167/80 | HR 79 | Ht 61.0 in | Wt 214.5 lb

## 2017-09-20 DIAGNOSIS — N952 Postmenopausal atrophic vaginitis: Secondary | ICD-10-CM | POA: Diagnosis not present

## 2017-09-20 DIAGNOSIS — Z87440 Personal history of urinary (tract) infections: Secondary | ICD-10-CM | POA: Diagnosis not present

## 2017-09-20 DIAGNOSIS — N3946 Mixed incontinence: Secondary | ICD-10-CM | POA: Diagnosis not present

## 2017-09-20 DIAGNOSIS — R351 Nocturia: Secondary | ICD-10-CM

## 2017-09-20 DIAGNOSIS — N8111 Cystocele, midline: Secondary | ICD-10-CM

## 2017-09-20 LAB — URINALYSIS, COMPLETE
BILIRUBIN UA: NEGATIVE
GLUCOSE, UA: NEGATIVE
NITRITE UA: POSITIVE — AB
Specific Gravity, UA: 1.01 (ref 1.005–1.030)
Urobilinogen, Ur: 0.2 mg/dL (ref 0.2–1.0)
pH, UA: 6.5 (ref 5.0–7.5)

## 2017-09-20 LAB — MICROSCOPIC EXAMINATION

## 2017-09-20 MED ORDER — FESOTERODINE FUMARATE ER 8 MG PO TB24: 8.0000 mg | ORAL_TABLET | Freq: Every day | ORAL | 0 refills | Status: DC

## 2017-09-20 NOTE — Progress Notes (Signed)
09/20/2017 11:29 AM   [name] 07-Feb-1936 [id number]  Referring provider: Katheren Shams [address] [place], [place] [address]  Chief Complaint  Patient presents with  . Recurrent UTI    HPI: 82 yo WF with history of recurrent UTI's, incontinence, vaginal atrophy, cystocele and nocturia presents for a yearly follow up.   Recurrent UTI's Her risk factor for recurrent UTI's vaginal atrophy, incontinence and age.  She had one documented positive culture for pseudomonas in 02/2017.   She states she is not currently having symptoms of an UTI.  Patient denies any gross hematuria, dysuria or suprapubic/flank pain.  Patient denies any fevers, chills, nausea or vomiting.  Her UA today is positive for > 30 WBC's, many bacteria and nitrite positive.    Vaginal atrophy Patient is applying the vaginal cream 3 nights weekly, when she remembers.        Incontinence She is experiencing urgency x 0-3 (improved), frequency x 8 (unchanged), incontinence x 0-3 (unchanged), nocturia x 0-3 (unchanged), she is not engaging in toilet mapping, she is not limiting her fluid intake to avoid trips to the restroom.  Her most bothersome symptoms are frequency, nocturia and hesitancy.  She finds the Myrbetriq helpful in controlling her urinary incontinence.  Her PVR is 0 mL.  Her BP is 167/80.    Nocturia Patient is using her CPAP machine.  She is having nocturia x 0-3 (unchanged).    Cystocele Found incidentally on exam. Patient denies any vaginal bulge.   PMH: Past Medical History:  Diagnosis Date  . Anemia   . Anxiety   . Arthritis, degenerative   . Chronic cystitis   . Depression   . Diabetes mellitus without complication ([place])   . GERD (gastroesophageal reflux disease)   . Headache   . Heart disease   . HLD (hyperlipidemia)   . HTN (hypertension)   . Hypertension   . Incontinence in female   . Murmur   . Obesity   . Sleep apnea   . Urge incontinence of urine      Surgical History: Past Surgical History:  Procedure Laterality Date  . BREAST BIOPSY Right 2004   neg  . BUNIONECTOMY    . CARDIAC SURGERY     Stent  . CHOLECYSTECTOMY    . COLONOSCOPY WITH PROPOFOL N/A 06/14/2017   Procedure: COLONOSCOPY WITH PROPOFOL;  Surgeon: Lollie Sails, MD;  Location: [facility] ENDOSCOPY;  Service: Endoscopy;  Laterality: N/A;  . JOINT REPLACEMENT     TKR Bilateral  . LOOP RECORDER INSERTION N/A 09/20/2016   Procedure: Loop Recorder Insertion;  Surgeon: Isaias Cowman, MD;  Location: [place] [facility];  Service: Cardiovascular;  Laterality: N/A;  . SEPTOPLASTY      Home Medications:  Allergies as of 09/20/2017      Reactions   Micardis [telmisartan] Other (See Comments)   Dizziness   Accupril [quinapril Hcl] Rash   Quinapril Rash      Medication List        Accurate as of 09/20/17 11:29 AM. Always use your most recent med list.          aspirin EC 81 MG tablet Take 81 mg by mouth daily.   budesonide-formoterol 80-4.5 MCG/ACT inhaler Commonly known as:  SYMBICORT Inhale 2 puffs into the lungs 2 (two) times daily.   CALCIUM + D3 PO Take 1 tablet by mouth daily.   carvedilol 3.125 MG tablet Commonly known as:  COREG Take 3.125 mg by  mouth 2 (two) times daily with a meal.   clopidogrel 75 MG tablet Commonly known as:  PLAVIX Take 75 mg by mouth daily.   conjugated estrogens vaginal cream Commonly known as:  PREMARIN Place 1 Applicatorful vaginally daily. Apply 0.5mg  (pea-sized amount)  just inside the vaginal introitus with a finger-tip every night for two weeks and then Monday, Wednesday and Friday nights.   Cranberry 500 MG Chew Chew 1 tablet by mouth daily.   ferrous sulfate 325 (65 FE) MG tablet Take 325 mg by mouth daily with breakfast.   fesoterodine 8 MG Tb24 tablet Commonly known as:  TOVIAZ Take 1 tablet (8 mg total) by mouth daily.   FIBER-CAPS 625 MG tablet Generic drug:  polycarbophil Take 625 mg by mouth  daily.   glipiZIDE 5 MG tablet Commonly known as:  GLUCOTROL Take 5 mg by mouth 2 (two) times daily.   JANUMET XR 50-1000 MG Tb24 Generic drug:  SitaGLIPtin-MetFORMIN HCl Take 1 tablet by mouth 2 (two) times daily.   LANTUS SOLOSTAR 100 UNIT/ML Solostar Pen Generic drug:  Insulin Glargine Inject 8 Units into the skin at bedtime.   losartan 100 MG tablet Commonly known as:  COZAAR Take 100 mg by mouth daily.   meloxicam 15 MG tablet Commonly known as:  MOBIC Take 15 mg by mouth daily.   mirabegron ER 25 MG Tb24 tablet Commonly known as:  MYRBETRIQ TAKE 1 TABLET(25 MG) BY MOUTH DAILY   nortriptyline 25 MG capsule Commonly known as:  PAMELOR Take 25 mg by mouth at bedtime.   ondansetron 4 MG tablet Commonly known as:  ZOFRAN Take 1 tablet (4 mg total) by mouth daily as needed for nausea or vomiting.   pantoprazole 40 MG tablet Commonly known as:  PROTONIX Take 40 mg by mouth daily.   prasugrel 10 MG Tabs tablet Commonly known as:  EFFIENT Take 10 mg by mouth daily.   pregabalin 75 MG capsule Commonly known as:  LYRICA Take 75 mg by mouth 2 (two) times daily.   PRESERVISION/LUTEIN Caps Take 1 capsule by mouth 2 (two) times daily.   REFRESH TEARS 0.5 % Soln Generic drug:  carboxymethylcellulose Apply 1-2 drops to eye at bedtime.   simvastatin 40 MG tablet Commonly known as:  ZOCOR Take 20 mg by mouth daily at 6 PM.   Verapamil HCl CR 300 MG Cp24 Take 1 capsule by mouth daily.   VITAMIN B-12 PO Take 1 tablet by mouth daily.       Allergies:  Allergies  Allergen Reactions  . Micardis [Telmisartan] Other (See Comments)    Dizziness  . Accupril [Quinapril Hcl] Rash  . Quinapril Rash    Family History: Family History  Problem Relation Age of Onset  . Breast cancer Cousin   . Skin cancer Unknown   . Diabetes Unknown   . Hypercholesterolemia Unknown   . Hypertension Unknown   . Bladder Cancer Maternal Aunt   . COPD Unknown   . Kidney disease  Neg Hx   . Kidney cancer Neg Hx     Social History:  reports that she has never smoked. She has never used smokeless tobacco. She reports that she does not drink alcohol or use drugs.  ROS: UROLOGY Frequent Urination?: Yes Hard to postpone urination?: No Burning/pain with urination?: No Get up at night to urinate?: Yes Leakage of urine?: No Urine stream starts and stops?: No Trouble starting stream?: Yes Do you have to strain to urinate?: No Blood in urine?:  No Urinary tract infection?: No Sexually transmitted disease?: No Injury to kidneys or bladder?: No Painful intercourse?: No Weak stream?: No Currently pregnant?: No Vaginal bleeding?: No Last menstrual period?: n  Gastrointestinal Nausea?: No Vomiting?: No Indigestion/heartburn?: No Diarrhea?: No Constipation?: No  Constitutional Fever: No Night sweats?: No Weight loss?: No Fatigue?: No  Skin Skin rash/lesions?: No Itching?: No  Eyes Blurred vision?: No Double vision?: No  Ears/Nose/Throat Sore throat?: No Sinus problems?: No  Hematologic/Lymphatic Swollen glands?: No Easy bruising?: No  Cardiovascular Leg swelling?: No Chest pain?: No  Respiratory Cough?: No Shortness of breath?: No  Endocrine Excessive thirst?: No  Musculoskeletal Back pain?: No Joint pain?: No  Neurological Headaches?: No Dizziness?: No  Psychologic Depression?: No Anxiety?: No  Physical Exam: BP (!) 167/80 (BP Location: Right Arm, Patient Position: Sitting, Cuff Size: Large)   Pulse 79   Ht 5\' 1"  (1.549 m)   Wt 214 lb 8 oz (97.3 kg)   BMI 40.53 kg/m   Constitutional: Well nourished. Alert and oriented, No acute distress. HEENT: [place] AT, moist mucus membranes. Trachea midline, no masses. Cardiovascular: No clubbing, cyanosis, or edema. Respiratory: Normal respiratory effort, no increased work of breathing. Skin: No rashes, bruises or suspicious lesions. Lymph: No cervical or inguinal  adenopathy. Neurologic: Grossly intact, no focal deficits, moving all 4 extremities. Psychiatric: Normal mood and affect.   Laboratory Data: Lab Results  Component Value Date   WBC 8.8 02/28/2017   HGB 12.4 02/28/2017   HCT 37.8 02/28/2017   MCV 78.3 (L) 02/28/2017   PLT 218 02/28/2017   Lab Results  Component Value Date   CREATININE 1.11 (H) 02/28/2017   Lab Results  Component Value Date   HGBA1C 7.3 (H) 04/16/2016      Component Value Date/Time   CHOL 141 06/30/2011 0551   HDL 63 (H) 06/30/2011 0551   VLDL 14 06/30/2011 0551   LDLCALC 64 06/30/2011 0551   Lab Results  Component Value Date   AST 25 04/14/2016   Lab Results  Component Value Date   ALT 17 04/14/2016   I have reviewed the labs.  Assessment & Plan:    1. History of recurrent UTI's  - No symptoms at this time - will send urine for culture in case she does develop symptoms of an UTI we have a culture on file to guide in antibiotic choice  - Reviewed UTI preventative strategies  - Asked patient to contact our office for symptoms of an UTI   2. Vaginal atrophy  - patient advised to use one of the creams, she will use Premarin  - encouraged her to apply three nights weekly  3. Incontinence:   Patient's incontinence is well controlled on Myrbetriq 25 mg daily, but her blood pressure is elevated.  We will have a trial of Toviaz 8 mg, # 28 samples given,  I have advised her of the side effects of Toviaz, such as: Dry eyes, dry mouth, constipation, mental confusion and/or urinary retention.  4. Cystocele:   Continue to manage conservatively at this time.  We will reassess when she returns in one year.    5. Nocturia  - encouraged the patient to continue using her CPAP machine  - stop fluids at 6 pm  - reduce dietary sodium   Return in about 3 weeks (around 10/11/2017) for PVR and OAB questionnaire.  These notes generated with voice recognition software. I apologize for typographical errors.  Zara Council, PA-C  [facility]  [address], [place], [place] [address] [phone]

## 2017-09-22 LAB — CULTURE, URINE COMPREHENSIVE

## 2017-10-11 ENCOUNTER — Ambulatory Visit: Payer: Medicare Other | Admitting: Urology

## 2017-11-18 NOTE — Progress Notes (Signed)
11/19/2017 11:30 AM   Brandi [name] 1935/07/31 [id number]  Referring provider: Katheren Shams [address] [place], [place] [address]  Chief Complaint  Patient presents with  . Follow-up    HPI: 82 yo WF with history of recurrent UTI's, incontinence, vaginal atrophy, cystocele and nocturia presents for a three week follow up after a trial of Toviaz.     Recurrent UTI's Her risk factor for recurrent UTI's vaginal atrophy, incontinence and age.  She had one documented positive culture for pseudomonas in 02/2017.   Patient denies any gross hematuria, dysuria or suprapubic/flank pain.  Patient denies any fevers, chills, nausea or vomiting.   Vaginal atrophy Patient is applying the vaginal cream 3 nights weekly, when she remembers.        Incontinence She is experiencing urgency x 0-3 (stable), frequency x 8 (stable), incontinence x 0-3 (stable), nocturia x 0-3 (stable), she is not engaging in toilet mapping, she is not limiting her fluid intake to avoid trips to the restroom.  Her most bothersome symptoms are urgency and nocturia.  She finds the Myrbetriq helpful in controlling her urinary incontinence.  We did switch her to Toviaz 8 mg daily, but she does not remember receiving the samples and she is still on Myrbetriq 25 mg.  Her BP is 181/83    Nocturia Patient is using her CPAP machine.  She is having nocturia x 0-3 (unchanged).    Cystocele Found incidentally on exam. Patient denies any vaginal bulge.   PMH: Past Medical History:  Diagnosis Date  . Anemia   . Anxiety   . Arthritis, degenerative   . Chronic cystitis   . Depression   . Diabetes mellitus without complication ([place])   . GERD (gastroesophageal reflux disease)   . Headache   . Heart disease   . HLD (hyperlipidemia)   . HTN (hypertension)   . Hypertension   . Incontinence in female   . Murmur   . Obesity   . Sleep apnea   . Urge incontinence of urine     Surgical History: Past  Surgical History:  Procedure Laterality Date  . BREAST BIOPSY Right 2004   neg  . BUNIONECTOMY    . CARDIAC SURGERY     Stent  . CHOLECYSTECTOMY    . COLONOSCOPY WITH PROPOFOL N/A 06/14/2017   Procedure: COLONOSCOPY WITH PROPOFOL;  Surgeon: Lollie Sails, MD;  Location: [facility] ENDOSCOPY;  Service: Endoscopy;  Laterality: N/A;  . JOINT REPLACEMENT     TKR Bilateral  . LOOP RECORDER INSERTION N/A 09/20/2016   Procedure: Loop Recorder Insertion;  Surgeon: Isaias Cowman, MD;  Location: [facility] CV LAB;  Service: Cardiovascular;  Laterality: N/A;  . SEPTOPLASTY      Home Medications:  Allergies as of 11/19/2017      Reactions   Micardis [telmisartan] Other (See Comments)   Dizziness   Accupril [quinapril Hcl] Rash   Quinapril Rash      Medication List        Accurate as of 11/19/17 11:30 AM. Always use your most recent med list.          aspirin EC 81 MG tablet Take 81 mg by mouth daily.   budesonide-formoterol 80-4.5 MCG/ACT inhaler Commonly known as:  SYMBICORT Inhale 2 puffs into the lungs 2 (two) times daily.   CALCIUM + D3 PO Take 1 tablet by mouth daily.   carvedilol 3.125 MG tablet Commonly known as:  COREG Take 3.125 mg by mouth 2 (  two) times daily with a meal.   clopidogrel 75 MG tablet Commonly known as:  PLAVIX Take 75 mg by mouth daily.   conjugated estrogens vaginal cream Commonly known as:  PREMARIN Place 1 Applicatorful vaginally daily. Apply 0.5mg  (pea-sized amount)  just inside the vaginal introitus with a finger-tip every night for two weeks and then Monday, Wednesday and Friday nights.   Cranberry 500 MG Chew Chew 1 tablet by mouth daily.   ferrous sulfate 325 (65 FE) MG tablet Take 325 mg by mouth daily with breakfast.   fesoterodine 8 MG Tb24 tablet Commonly known as:  TOVIAZ Take 1 tablet (8 mg total) by mouth daily.   FIBER-CAPS 625 MG tablet Generic drug:  polycarbophil Take 625 mg by mouth daily.   glipiZIDE 5 MG  tablet Commonly known as:  GLUCOTROL Take 5 mg by mouth 2 (two) times daily.   JANUMET XR 50-1000 MG Tb24 Generic drug:  SitaGLIPtin-MetFORMIN HCl Take 1 tablet by mouth 2 (two) times daily.   LANTUS SOLOSTAR 100 UNIT/ML Solostar Pen Generic drug:  Insulin Glargine Inject 8 Units into the skin at bedtime.   losartan 100 MG tablet Commonly known as:  COZAAR Take 100 mg by mouth daily.   meloxicam 15 MG tablet Commonly known as:  MOBIC Take 15 mg by mouth daily.   mirabegron ER 25 MG Tb24 tablet Commonly known as:  MYRBETRIQ TAKE 1 TABLET(25 MG) BY MOUTH DAILY   nortriptyline 25 MG capsule Commonly known as:  PAMELOR Take 25 mg by mouth at bedtime.   ondansetron 4 MG tablet Commonly known as:  ZOFRAN Take 1 tablet (4 mg total) by mouth daily as needed for nausea or vomiting.   pantoprazole 40 MG tablet Commonly known as:  PROTONIX Take 40 mg by mouth daily.   prasugrel 10 MG Tabs tablet Commonly known as:  EFFIENT Take 10 mg by mouth daily.   pregabalin 75 MG capsule Commonly known as:  LYRICA Take 75 mg by mouth 2 (two) times daily.   PRESERVISION/LUTEIN Caps Take 1 capsule by mouth 2 (two) times daily.   REFRESH TEARS 0.5 % Soln Generic drug:  carboxymethylcellulose Apply 1-2 drops to eye at bedtime.   simvastatin 40 MG tablet Commonly known as:  ZOCOR Take 20 mg by mouth daily at 6 PM.   Verapamil HCl CR 300 MG Cp24 Take 1 capsule by mouth daily.   VITAMIN B-12 PO Take 1 tablet by mouth daily.       Allergies:  Allergies  Allergen Reactions  . Micardis [Telmisartan] Other (See Comments)    Dizziness  . Accupril [Quinapril Hcl] Rash  . Quinapril Rash    Family History: Family History  Problem Relation Age of Onset  . Breast cancer Cousin   . Skin cancer Unknown   . Diabetes Unknown   . Hypercholesterolemia Unknown   . Hypertension Unknown   . Bladder Cancer Maternal Aunt   . COPD Unknown   . Kidney disease Neg Hx   . Kidney cancer  Neg Hx     Social History:  reports that she has never smoked. She has never used smokeless tobacco. She reports that she does not drink alcohol or use drugs.  ROS: UROLOGY Frequent Urination?: Yes Hard to postpone urination?: No Burning/pain with urination?: No Get up at night to urinate?: Yes Leakage of urine?: No Urine stream starts and stops?: No Trouble starting stream?: No Do you have to strain to urinate?: No Blood in urine?: No Urinary  tract infection?: No Sexually transmitted disease?: No Injury to kidneys or bladder?: No Painful intercourse?: No Weak stream?: No Currently pregnant?: No Vaginal bleeding?: No Last menstrual period?: n  Gastrointestinal Nausea?: No Vomiting?: No Indigestion/heartburn?: No Diarrhea?: No Constipation?: No  Constitutional Fever: No Night sweats?: No Weight loss?: No Fatigue?: No  Skin Skin rash/lesions?: No Itching?: No  Eyes Blurred vision?: No Double vision?: No  Ears/Nose/Throat Sore throat?: No Sinus problems?: No  Hematologic/Lymphatic Swollen glands?: No Easy bruising?: No  Cardiovascular Leg swelling?: No Chest pain?: No  Respiratory Cough?: No Shortness of breath?: No  Endocrine Excessive thirst?: No  Musculoskeletal Back pain?: No Joint pain?: No  Neurological Headaches?: No Dizziness?: No  Psychologic Depression?: No Anxiety?: No  Physical Exam: BP (!) 181/83   Pulse 80   Ht 5' (1.524 m)   Wt 210 lb (95.3 kg)   BMI 41.01 kg/m   Constitutional: Well nourished. Alert and oriented, No acute distress. HEENT: [place] AT, moist mucus membranes. Trachea midline, no masses. Cardiovascular: No clubbing, cyanosis, or edema. Respiratory: Normal respiratory effort, no increased work of breathing. GI: Abdomen is soft, non tender, non distended, no abdominal masses. Liver and spleen not palpable.  No hernias appreciated.  Stool sample for occult testing is not indicated.   GU: No CVA tenderness.   No bladder fullness no masses.  Atroph external genitalia, normal pubic hair distribution, no lesions.  Normal urethral meatus, no lesions, no prolapse, no discharge.   No urethral masses, tenderness and/or tenderness. No bladder fullness, tenderness or masses.  Pale vagina mucosa, poor estrogen effect, no discharge, no lesions, good pelvic support, grade II cystocele. No rectocele noted.  No cervical motion tenderness.  Uterus is freely mobile and non-fixed.  No adnexal/parametria masses or tenderness noted.  Anus and perineum are without rashes or lesions.    Skin: No rashes, bruises or suspicious lesions. Lymph: No cervical or inguinal adenopathy. Neurologic: Grossly intact, no focal deficits, moving all 4 extremities. Psychiatric: Normal mood and affect.  Laboratory Data: Lab Results  Component Value Date   WBC 8.8 02/28/2017   HGB 12.4 02/28/2017   HCT 37.8 02/28/2017   MCV 78.3 (L) 02/28/2017   PLT 218 02/28/2017   Lab Results  Component Value Date   CREATININE 1.11 (H) 02/28/2017   Lab Results  Component Value Date   HGBA1C 7.3 (H) 04/16/2016      Component Value Date/Time   CHOL 141 06/30/2011 0551   HDL 63 (H) 06/30/2011 0551   VLDL 14 06/30/2011 0551   LDLCALC 64 06/30/2011 0551   Lab Results  Component Value Date   AST 25 04/14/2016   Lab Results  Component Value Date   ALT 17 04/14/2016   I have reviewed the labs.  Assessment & Plan:    1. History of recurrent UTI's No symptoms at this time  Asked patient to contact our office for symptoms of an UTI   2. Vaginal atrophy Applying the cream when she remembers  3. Incontinence Patient's incontinence is well controlled on Myrbetriq 25 mg daily, but her blood pressure is elevated.  She did not take or remember taking the Toviaz 8 mg, # 28 samples so more samples are given,  I have advised her of the side effects of Toviaz, such as: Dry eyes, dry mouth, constipation, mental confusion and/or urinary  retention.  4. Cystocele:   Continue to manage conservatively at this time.  We will reassess when she returns in one year.  5. Nocturia Encouraged the patient to continue using her CPAP machine Advised to stop fluids at 6 pm Reduce dietary sodium   Return in about 3 weeks (around 12/10/2017) for PVR and OAB questionnaire.  These notes generated with voice recognition software. I apologize for typographical errors.  Zara Council, PA-C  [facility] [address] [place], [place] [address] [phone]

## 2017-11-19 ENCOUNTER — Encounter: Payer: Self-pay | Admitting: Urology

## 2017-11-19 ENCOUNTER — Ambulatory Visit: Payer: Medicare Other | Admitting: Urology

## 2017-11-19 VITALS — BP 181/83 | HR 80 | Ht 60.0 in | Wt 210.0 lb

## 2017-11-19 DIAGNOSIS — N3946 Mixed incontinence: Secondary | ICD-10-CM | POA: Diagnosis not present

## 2017-11-19 DIAGNOSIS — R351 Nocturia: Secondary | ICD-10-CM | POA: Diagnosis not present

## 2017-11-19 DIAGNOSIS — Z87440 Personal history of urinary (tract) infections: Secondary | ICD-10-CM | POA: Diagnosis not present

## 2017-11-19 DIAGNOSIS — N952 Postmenopausal atrophic vaginitis: Secondary | ICD-10-CM

## 2017-11-19 DIAGNOSIS — N8111 Cystocele, midline: Secondary | ICD-10-CM | POA: Diagnosis not present

## 2017-12-08 NOTE — Progress Notes (Signed)
12/10/2017 9:20 PM   Brandi [name] [birth year] [id number]  Referring provider: Katheren Shams [address] [place], [place] [address]  Chief Complaint  Patient presents with  . Recurrent UTI  . Urinary Incontinence    HPI: 82 yo WF with history of recurrent UTI's, incontinence, vaginal atrophy, cystocele and nocturia presents for a three week follow up after a trial of Toviaz.     Recurrent UTI's Her risk factor for recurrent UTI's vaginal atrophy, incontinence and age.  She had one documented positive culture for pseudomonas in 02/2017.   Patient denies any gross hematuria, dysuria or suprapubic/flank pain.  Patient denies any fevers, chills, nausea or vomiting.   She is not symptomatic at this time.  Vaginal atrophy Patient is applying the vaginal cream 3 nights weekly, when she remembers.        Incontinence She is experiencing urgency x 0-3 (stable), frequency x 8 or more (stable), incontinence x 0-3 (stable), nocturia x 0-3 (stable), she is not engaging in toilet mapping, she is not limiting her fluid intake to avoid trips to the restroom.  Her most bothersome symptoms are urgency and nocturia.  She is currently on Toviaz 8 mg daily, but she felt the Myrbetriq was more effective at reaching her goals. The Toviaz caused extreme dry mouth.  Her BP is 127/79.    Nocturia Patient is using her CPAP machine.  She is having nocturia x 0-3 (unchanged).    Cystocele Found incidentally on exam. Patient denies any vaginal bulge.   PMH: Past Medical History:  Diagnosis Date  . Anemia   . Anxiety   . Arthritis, degenerative   . Chronic cystitis   . Depression   . Diabetes mellitus without complication ([place])   . GERD (gastroesophageal reflux disease)   . Headache   . Heart disease   . HLD (hyperlipidemia)   . HTN (hypertension)   . Hypertension   . Incontinence in female   . Murmur   . Obesity   . Sleep apnea   . Urge incontinence of urine      Surgical History: Past Surgical History:  Procedure Laterality Date  . BREAST BIOPSY Right 2004   neg  . BUNIONECTOMY    . CARDIAC SURGERY     Stent  . CHOLECYSTECTOMY    . COLONOSCOPY WITH PROPOFOL N/A 06/14/2017   Procedure: COLONOSCOPY WITH PROPOFOL;  Surgeon: Lollie Sails, MD;  Location: [facility] ENDOSCOPY;  Service: Endoscopy;  Laterality: N/A;  . JOINT REPLACEMENT     TKR Bilateral  . LOOP RECORDER INSERTION N/A 09/20/2016   Procedure: Loop Recorder Insertion;  Surgeon: Isaias Cowman, MD;  [facility];  Service: Cardiovascular;  Laterality: N/A;  . SEPTOPLASTY      Home Medications:  Allergies as of 12/10/2017      Reactions   Micardis [telmisartan] Other (See Comments)   Dizziness   Accupril [quinapril Hcl] Rash   Quinapril Rash      Medication List        Accurate as of 12/10/17  9:20 PM. Always use your most recent med list.          aspirin EC 81 MG tablet Take 81 mg by mouth daily.   budesonide-formoterol 80-4.5 MCG/ACT inhaler Commonly known as:  SYMBICORT Inhale 2 puffs into the lungs 2 (two) times daily.   CALCIUM + D3 PO Take 1 tablet by mouth daily.   carvedilol 3.125 MG tablet Commonly known as:  COREG  Take 3.125 mg by mouth 2 (two) times daily with a meal.   clopidogrel 75 MG tablet Commonly known as:  PLAVIX Take 75 mg by mouth daily.   conjugated estrogens vaginal cream Commonly known as:  PREMARIN Place 1 Applicatorful vaginally daily. Apply 0.5mg  (pea-sized amount)  just inside the vaginal introitus with a finger-tip every night for two weeks and then Monday, Wednesday and Friday nights.   Cranberry 500 MG Chew Chew 1 tablet by mouth daily.   ferrous sulfate 325 (65 FE) MG tablet Take 325 mg by mouth daily with breakfast.   fesoterodine 8 MG Tb24 tablet Commonly known as:  TOVIAZ Take 1 tablet (8 mg total) by mouth daily.   FIBER-CAPS 625 MG tablet Generic drug:  polycarbophil Take 625 mg by mouth  daily.   glipiZIDE 5 MG tablet Commonly known as:  GLUCOTROL Take 5 mg by mouth 2 (two) times daily.   hydrochlorothiazide 12.5 MG tablet Commonly known as:  HYDRODIURIL   JANUMET XR 50-1000 MG Tb24 Generic drug:  SitaGLIPtin-MetFORMIN HCl Take 1 tablet by mouth 2 (two) times daily.   LANTUS SOLOSTAR 100 UNIT/ML Solostar Pen Generic drug:  Insulin Glargine Inject 8 Units into the skin at bedtime.   losartan 100 MG tablet Commonly known as:  COZAAR Take 100 mg by mouth daily.   meloxicam 15 MG tablet Commonly known as:  MOBIC Take 15 mg by mouth daily.   mirabegron ER 25 MG Tb24 tablet Commonly known as:  MYRBETRIQ TAKE 1 TABLET(25 MG) BY MOUTH DAILY   nortriptyline 25 MG capsule Commonly known as:  PAMELOR Take 25 mg by mouth at bedtime.   ondansetron 4 MG tablet Commonly known as:  ZOFRAN Take 1 tablet (4 mg total) by mouth daily as needed for nausea or vomiting.   pantoprazole 40 MG tablet Commonly known as:  PROTONIX Take 40 mg by mouth daily.   prasugrel 10 MG Tabs tablet Commonly known as:  EFFIENT Take 10 mg by mouth daily.   pregabalin 75 MG capsule Commonly known as:  LYRICA Take 75 mg by mouth 2 (two) times daily.   PRESERVISION/LUTEIN Caps Take 1 capsule by mouth 2 (two) times daily.   REFRESH TEARS 0.5 % Soln Generic drug:  carboxymethylcellulose Apply 1-2 drops to eye at bedtime.   simvastatin 40 MG tablet Commonly known as:  ZOCOR Take 20 mg by mouth daily at 6 PM.   Verapamil HCl CR 300 MG Cp24 Take 1 capsule by mouth daily.   VITAMIN B-12 PO Take 1 tablet by mouth daily.       Allergies:  Allergies  Allergen Reactions  . Micardis [Telmisartan] Other (See Comments)    Dizziness  . Accupril [Quinapril Hcl] Rash  . Quinapril Rash    Family History: Family History  Problem Relation Age of Onset  . Breast cancer Cousin   . Skin cancer Unknown   . Diabetes Unknown   . Hypercholesterolemia Unknown   . Hypertension Unknown     . Bladder Cancer Maternal Aunt   . COPD Unknown   . Kidney disease Neg Hx   . Kidney cancer Neg Hx     Social History:  reports that she has never smoked. She has never used smokeless tobacco. She reports that she does not drink alcohol or use drugs.  ROS: UROLOGY Frequent Urination?: Yes Hard to postpone urination?: No Burning/pain with urination?: No Get up at night to urinate?: Yes Leakage of urine?: No Urine stream starts and stops?:  No Trouble starting stream?: No Do you have to strain to urinate?: No Blood in urine?: No Urinary tract infection?: No Sexually transmitted disease?: No Injury to kidneys or bladder?: No Painful intercourse?: No Weak stream?: No Currently pregnant?: No Vaginal bleeding?: No Last menstrual period?: n  Gastrointestinal Nausea?: No Vomiting?: No Indigestion/heartburn?: No Diarrhea?: No Constipation?: No  Constitutional Fever: No Night sweats?: No Weight loss?: No Fatigue?: No  Skin Skin rash/lesions?: No Itching?: No  Eyes Blurred vision?: No Double vision?: No  Ears/Nose/Throat Sore throat?: No Sinus problems?: No  Hematologic/Lymphatic Swollen glands?: No Easy bruising?: No  Cardiovascular Leg swelling?: No Chest pain?: No  Respiratory Cough?: No Shortness of breath?: No  Endocrine Excessive thirst?: Yes  Musculoskeletal Back pain?: No Joint pain?: No  Neurological Headaches?: No Dizziness?: No  Psychologic Depression?: No Anxiety?: No  Physical Exam: BP 127/79 (BP Location: Right Arm, Patient Position: Sitting, Cuff Size: Large)   Pulse 89   Ht 5' (1.524 m)   Wt 208 lb 9.6 oz (94.6 kg)   BMI 40.74 kg/m   Constitutional: Well nourished. Alert and oriented, No acute distress. HEENT: [place] AT, moist mucus membranes. Trachea midline, no masses. Cardiovascular: No clubbing, cyanosis, or edema. Respiratory: Normal respiratory effort, no increased work of breathing. Skin: No rashes, bruises or  suspicious lesions. Lymph: No cervical or inguinal adenopathy. Neurologic: Grossly intact, no focal deficits, moving all 4 extremities. Psychiatric: Normal mood and affect.   Laboratory Data: Lab Results  Component Value Date   WBC 8.8 02/28/2017   HGB 12.4 02/28/2017   HCT 37.8 02/28/2017   MCV 78.3 (L) 02/28/2017   PLT 218 02/28/2017   Lab Results  Component Value Date   CREATININE 1.11 (H) 02/28/2017   Lab Results  Component Value Date   HGBA1C 7.3 (H) 04/16/2016      Component Value Date/Time   CHOL 141 06/30/2011 0551   HDL 63 (H) 06/30/2011 0551   VLDL 14 06/30/2011 0551   LDLCALC 64 06/30/2011 0551   Lab Results  Component Value Date   AST 25 04/14/2016   Lab Results  Component Value Date   ALT 17 04/14/2016   I have reviewed the labs.  Assessment & Plan:    1. History of recurrent UTI's No symptoms at this time  Asked patient to contact our office for symptoms of an UTI   2. Vaginal atrophy Applying the cream when she remembers  3. Incontinence Patient would like to go back on Myrbetriq 25 mg daily at this time She is given samples and will return in 1 week for BP recheck  4. Cystocele:   Continue to manage conservatively at this time.  We will reassess when she returns in one year.      Return for BP check only .  These notes generated with voice recognition software. I apologize for typographical errors.  Zara Council, PA-C  [facility] [address] [place], [place] [address] [phone]

## 2017-12-10 ENCOUNTER — Ambulatory Visit: Payer: Medicare Other | Admitting: Urology

## 2017-12-10 ENCOUNTER — Encounter: Payer: Self-pay | Admitting: Urology

## 2017-12-10 VITALS — BP 127/79 | HR 89 | Ht 60.0 in | Wt 208.6 lb

## 2017-12-10 DIAGNOSIS — N8111 Cystocele, midline: Secondary | ICD-10-CM | POA: Diagnosis not present

## 2017-12-10 DIAGNOSIS — N952 Postmenopausal atrophic vaginitis: Secondary | ICD-10-CM | POA: Diagnosis not present

## 2017-12-10 DIAGNOSIS — N3946 Mixed incontinence: Secondary | ICD-10-CM

## 2017-12-10 DIAGNOSIS — Z87440 Personal history of urinary (tract) infections: Secondary | ICD-10-CM

## 2017-12-10 LAB — BLADDER SCAN AMB NON-IMAGING

## 2017-12-13 ENCOUNTER — Other Ambulatory Visit: Payer: Self-pay | Admitting: Urology

## 2017-12-13 ENCOUNTER — Telehealth: Payer: Self-pay | Admitting: Urology

## 2017-12-13 DIAGNOSIS — N3946 Mixed incontinence: Secondary | ICD-10-CM

## 2017-12-13 NOTE — Telephone Encounter (Signed)
Please call Brandi [name] to see if she is on nortriptyline Engineer, civil (consulting)).  If she is, she cannot take the Myrbetriq as they interact and could cause heart arrhythmias.

## 2017-12-14 NOTE — Telephone Encounter (Signed)
LMOM for patient to return call. Need to know abourt medications.

## 2017-12-17 ENCOUNTER — Ambulatory Visit: Payer: Medicare Other

## 2017-12-17 VITALS — BP 193/80

## 2017-12-17 DIAGNOSIS — N3946 Mixed incontinence: Secondary | ICD-10-CM

## 2017-12-17 NOTE — Telephone Encounter (Signed)
lmom for pt to call office

## 2017-12-17 NOTE — Progress Notes (Signed)
Pt is present in office today for blood pressure check. Pt was started on Myrbetriq recently and must maintain a safe blood pressure level to continue. Pt blood pressure was high today, after consulting with Zara Council, pt will continue Myrbetriq, but must follow-up with her primary care provider to manage blood pressure. Pt stated understanding.

## 2017-12-18 NOTE — Telephone Encounter (Signed)
Called pt no answer. LM for pt to call back. Letter mailed as this is 3rd attempt to contact pt.

## 2017-12-19 NOTE — Telephone Encounter (Signed)
Patient returned the call to the office.  She is no longer taking nortriptyline (Pamelor).  She will continue with the Myrbetriq.  Patient reports that she went to her PCP about her BP and is doing much better.

## 2017-12-27 ENCOUNTER — Other Ambulatory Visit: Payer: Self-pay | Admitting: Internal Medicine

## 2017-12-27 DIAGNOSIS — Z1231 Encounter for screening mammogram for malignant neoplasm of breast: Secondary | ICD-10-CM

## 2018-02-04 ENCOUNTER — Ambulatory Visit
Admission: RE | Admit: 2018-02-04 | Discharge: 2018-02-04 | Disposition: A | Discharge status: Home / Self Care | Payer: Medicare Other | Source: Ambulatory Visit | Attending: Internal Medicine | Admitting: Internal Medicine

## 2018-02-04 DIAGNOSIS — Z1231 Encounter for screening mammogram for malignant neoplasm of breast: Secondary | ICD-10-CM | POA: Diagnosis not present

## 2018-02-13 ENCOUNTER — Other Ambulatory Visit: Payer: Self-pay | Admitting: Urology

## 2018-02-13 DIAGNOSIS — N3946 Mixed incontinence: Secondary | ICD-10-CM

## 2018-02-21 ENCOUNTER — Other Ambulatory Visit: Payer: Self-pay

## 2018-02-21 DIAGNOSIS — M461 Sacroiliitis, not elsewhere classified: Secondary | ICD-10-CM | POA: Insufficient documentation

## 2018-03-02 ENCOUNTER — Other Ambulatory Visit: Payer: Self-pay | Admitting: Urology

## 2018-03-02 DIAGNOSIS — N3946 Mixed incontinence: Secondary | ICD-10-CM

## 2018-05-20 ENCOUNTER — Inpatient Hospital Stay
Admission: EM | Admit: 2018-05-20 | Discharge: 2018-05-25 | DRG: 281 | Disposition: A | Discharge status: Other Acute Inpatient Hospital | Payer: Medicare Other | Attending: Internal Medicine | Admitting: Internal Medicine

## 2018-05-20 ENCOUNTER — Emergency Department: Payer: Medicare Other

## 2018-05-20 ENCOUNTER — Other Ambulatory Visit: Payer: Self-pay

## 2018-05-20 ENCOUNTER — Encounter: Payer: Self-pay | Admitting: Emergency Medicine

## 2018-05-20 DIAGNOSIS — T82855A Stenosis of coronary artery stent, initial encounter: Secondary | ICD-10-CM | POA: Diagnosis not present

## 2018-05-20 DIAGNOSIS — N3020 Other chronic cystitis without hematuria: Secondary | ICD-10-CM | POA: Diagnosis present

## 2018-05-20 DIAGNOSIS — Z791 Long term (current) use of non-steroidal anti-inflammatories (NSAID): Secondary | ICD-10-CM

## 2018-05-20 DIAGNOSIS — Z7951 Long term (current) use of inhaled steroids: Secondary | ICD-10-CM

## 2018-05-20 DIAGNOSIS — I214 Non-ST elevation (NSTEMI) myocardial infarction: Secondary | ICD-10-CM

## 2018-05-20 DIAGNOSIS — N3941 Urge incontinence: Secondary | ICD-10-CM | POA: Diagnosis present

## 2018-05-20 DIAGNOSIS — E782 Mixed hyperlipidemia: Secondary | ICD-10-CM | POA: Diagnosis present

## 2018-05-20 DIAGNOSIS — G4730 Sleep apnea, unspecified: Secondary | ICD-10-CM | POA: Diagnosis present

## 2018-05-20 DIAGNOSIS — R9439 Abnormal result of other cardiovascular function study: Secondary | ICD-10-CM | POA: Diagnosis present

## 2018-05-20 DIAGNOSIS — I200 Unstable angina: Secondary | ICD-10-CM | POA: Diagnosis present

## 2018-05-20 DIAGNOSIS — Z7982 Long term (current) use of aspirin: Secondary | ICD-10-CM

## 2018-05-20 DIAGNOSIS — R072 Precordial pain: Secondary | ICD-10-CM | POA: Diagnosis not present

## 2018-05-20 DIAGNOSIS — Z888 Allergy status to other drugs, medicaments and biological substances status: Secondary | ICD-10-CM

## 2018-05-20 DIAGNOSIS — R7989 Other specified abnormal findings of blood chemistry: Secondary | ICD-10-CM | POA: Diagnosis present

## 2018-05-20 DIAGNOSIS — Z6839 Body mass index (BMI) 39.0-39.9, adult: Secondary | ICD-10-CM

## 2018-05-20 DIAGNOSIS — Z95818 Presence of other cardiac implants and grafts: Secondary | ICD-10-CM

## 2018-05-20 DIAGNOSIS — Y712 Prosthetic and other implants, materials and accessory cardiovascular devices associated with adverse incidents: Secondary | ICD-10-CM | POA: Diagnosis present

## 2018-05-20 DIAGNOSIS — I25110 Atherosclerotic heart disease of native coronary artery with unstable angina pectoris: Secondary | ICD-10-CM | POA: Diagnosis present

## 2018-05-20 DIAGNOSIS — E1122 Type 2 diabetes mellitus with diabetic chronic kidney disease: Secondary | ICD-10-CM | POA: Diagnosis present

## 2018-05-20 DIAGNOSIS — Z96653 Presence of artificial knee joint, bilateral: Secondary | ICD-10-CM | POA: Diagnosis present

## 2018-05-20 DIAGNOSIS — Z79899 Other long term (current) drug therapy: Secondary | ICD-10-CM

## 2018-05-20 DIAGNOSIS — R079 Chest pain, unspecified: Secondary | ICD-10-CM | POA: Diagnosis present

## 2018-05-20 DIAGNOSIS — N183 Chronic kidney disease, stage 3 (moderate): Secondary | ICD-10-CM | POA: Diagnosis present

## 2018-05-20 DIAGNOSIS — Z7902 Long term (current) use of antithrombotics/antiplatelets: Secondary | ICD-10-CM

## 2018-05-20 DIAGNOSIS — K219 Gastro-esophageal reflux disease without esophagitis: Secondary | ICD-10-CM | POA: Diagnosis present

## 2018-05-20 DIAGNOSIS — E669 Obesity, unspecified: Secondary | ICD-10-CM | POA: Diagnosis present

## 2018-05-20 DIAGNOSIS — Z9989 Dependence on other enabling machines and devices: Secondary | ICD-10-CM

## 2018-05-20 DIAGNOSIS — I129 Hypertensive chronic kidney disease with stage 1 through stage 4 chronic kidney disease, or unspecified chronic kidney disease: Secondary | ICD-10-CM | POA: Diagnosis present

## 2018-05-20 DIAGNOSIS — Z955 Presence of coronary angioplasty implant and graft: Secondary | ICD-10-CM

## 2018-05-20 DIAGNOSIS — Z79890 Hormone replacement therapy (postmenopausal): Secondary | ICD-10-CM

## 2018-05-20 DIAGNOSIS — Z794 Long term (current) use of insulin: Secondary | ICD-10-CM

## 2018-05-20 DIAGNOSIS — M1990 Unspecified osteoarthritis, unspecified site: Secondary | ICD-10-CM | POA: Diagnosis present

## 2018-05-20 HISTORY — DX: Non-ST elevation (NSTEMI) myocardial infarction: I21.4

## 2018-05-20 LAB — CBC
HEMATOCRIT: 41.6 % (ref 36.0–46.0)
Hemoglobin: 13.3 g/dL (ref 12.0–15.0)
MCH: 25.7 pg — AB (ref 26.0–34.0)
MCHC: 32 g/dL (ref 30.0–36.0)
MCV: 80.5 fL (ref 80.0–100.0)
PLATELETS: 240 10*3/uL (ref 150–400)
RBC: 5.17 MIL/uL — AB (ref 3.87–5.11)
RDW: 14.3 % (ref 11.5–15.5)
WBC: 7.2 10*3/uL (ref 4.0–10.5)
nRBC: 0 % (ref 0.0–0.2)

## 2018-05-20 LAB — TROPONIN I
TROPONIN I: 0.25 ng/mL — AB (ref <0.03)
Troponin I: 0.3 ng/mL (ref <0.03)

## 2018-05-20 LAB — GLUCOSE, CAPILLARY
Glucose-Capillary: 100 mg/dL — ABNORMAL HIGH (ref 70–99)
Glucose-Capillary: 175 mg/dL — ABNORMAL HIGH (ref 70–99)

## 2018-05-20 LAB — BASIC METABOLIC PANEL
ANION GAP: 14 (ref 5–15)
BUN: 15 mg/dL (ref 8–23)
CALCIUM: 9.1 mg/dL (ref 8.9–10.3)
CO2: 25 mmol/L (ref 22–32)
CREATININE: 1.1 mg/dL — AB (ref 0.44–1.00)
Chloride: 96 mmol/L — ABNORMAL LOW (ref 98–111)
GFR calc Af Amer: 55 mL/min — ABNORMAL LOW (ref >60)
GFR calc non Af Amer: 47 mL/min — ABNORMAL LOW (ref >60)
GLUCOSE: 264 mg/dL — AB (ref 70–99)
Potassium: 4 mmol/L (ref 3.5–5.1)
Sodium: 135 mmol/L (ref 135–145)

## 2018-05-20 MED ORDER — ONDANSETRON HCL 4 MG PO TABS: 4.0000 mg | ORAL_TABLET | Freq: Four times a day (QID) | ORAL | Status: DC | PRN

## 2018-05-20 MED ORDER — ONDANSETRON HCL 4 MG/2ML IJ SOLN: 4.0000 mg | Freq: Four times a day (QID) | INTRAMUSCULAR | Status: DC | PRN

## 2018-05-20 MED ORDER — ACETAMINOPHEN 650 MG RE SUPP: 650.0000 mg | Freq: Four times a day (QID) | RECTAL | Status: DC | PRN

## 2018-05-20 MED ORDER — GLIPIZIDE 5 MG PO TABS
5.0000 mg | ORAL_TABLET | Freq: Two times a day (BID) | ORAL | Status: DC
  Administered 2018-05-20: 5 mg via ORAL
  Filled 2018-05-20 (×3): qty 1

## 2018-05-20 MED ORDER — INSULIN GLARGINE 100 UNIT/ML ~~LOC~~ SOLN
12.0000 [IU] | Freq: Every day | SUBCUTANEOUS | Status: DC
  Filled 2018-05-20: qty 0.12

## 2018-05-20 MED ORDER — CARVEDILOL 3.125 MG PO TABS
3.1250 mg | ORAL_TABLET | Freq: Two times a day (BID) | ORAL | Status: DC
  Administered 2018-05-20 – 2018-05-24 (×7): 3.125 mg via ORAL
  Filled 2018-05-20 (×7): qty 1

## 2018-05-20 MED ORDER — SODIUM CHLORIDE 0.9% FLUSH
3.0000 mL | Freq: Two times a day (BID) | INTRAVENOUS | Status: DC
  Administered 2018-05-20 – 2018-05-24 (×8): 3 mL via INTRAVENOUS

## 2018-05-20 MED ORDER — VITAMIN B-12 100 MCG PO TABS
100.0000 ug | ORAL_TABLET | Freq: Every day | ORAL | Status: DC
  Administered 2018-05-21 – 2018-05-24 (×4): 100 ug via ORAL
  Filled 2018-05-20 (×6): qty 1

## 2018-05-20 MED ORDER — ENOXAPARIN SODIUM 100 MG/ML ~~LOC~~ SOLN
95.0000 mg | Freq: Two times a day (BID) | SUBCUTANEOUS | Status: DC
  Administered 2018-05-21 (×2): 95 mg via SUBCUTANEOUS
  Filled 2018-05-20 (×3): qty 1

## 2018-05-20 MED ORDER — ASPIRIN 81 MG PO CHEW
324.0000 mg | CHEWABLE_TABLET | Freq: Once | ORAL | Status: AC
  Administered 2018-05-20: 324 mg via ORAL
  Filled 2018-05-20: qty 4

## 2018-05-20 MED ORDER — ASPIRIN EC 81 MG PO TBEC
81.0000 mg | DELAYED_RELEASE_TABLET | Freq: Every day | ORAL | Status: DC
  Administered 2018-05-21 – 2018-05-24 (×3): 81 mg via ORAL
  Filled 2018-05-20 (×3): qty 1

## 2018-05-20 MED ORDER — CLOPIDOGREL BISULFATE 75 MG PO TABS
75.0000 mg | ORAL_TABLET | Freq: Every day | ORAL | Status: DC
  Administered 2018-05-20 – 2018-05-21 (×2): 75 mg via ORAL
  Filled 2018-05-20 (×2): qty 1

## 2018-05-20 MED ORDER — CALCIUM CARBONATE-VITAMIN D 500-200 MG-UNIT PO TABS
1.0000 | ORAL_TABLET | Freq: Every day | ORAL | Status: DC
  Administered 2018-05-21 – 2018-05-24 (×4): 1 via ORAL
  Filled 2018-05-20 (×4): qty 1

## 2018-05-20 MED ORDER — METFORMIN HCL 500 MG PO TABS: 500.0000 mg | ORAL_TABLET | Freq: Two times a day (BID) | ORAL | Status: DC

## 2018-05-20 MED ORDER — INSULIN ASPART 100 UNIT/ML ~~LOC~~ SOLN
0.0000 [IU] | Freq: Three times a day (TID) | SUBCUTANEOUS | Status: DC
  Administered 2018-05-21 – 2018-05-22 (×2): 3 [IU] via SUBCUTANEOUS
  Administered 2018-05-22: 5 [IU] via SUBCUTANEOUS
  Administered 2018-05-23: 3 [IU] via SUBCUTANEOUS
  Administered 2018-05-23: 2 [IU] via SUBCUTANEOUS
  Administered 2018-05-23: 3 [IU] via SUBCUTANEOUS
  Administered 2018-05-24: 5 [IU] via SUBCUTANEOUS
  Administered 2018-05-24: 3 [IU] via SUBCUTANEOUS
  Administered 2018-05-24: 1 [IU] via SUBCUTANEOUS
  Filled 2018-05-20 (×9): qty 1

## 2018-05-20 MED ORDER — LINAGLIPTIN 5 MG PO TABS: 5.0000 mg | ORAL_TABLET | Freq: Two times a day (BID) | ORAL | Status: DC

## 2018-05-20 MED ORDER — SODIUM CHLORIDE 0.9 % IV SOLN: 250.0000 mL | INTRAVENOUS | Status: DC | PRN

## 2018-05-20 MED ORDER — POLYVINYL ALCOHOL 1.4 % OP SOLN
1.0000 [drp] | Freq: Every day | OPHTHALMIC | Status: DC
  Administered 2018-05-20: 2 [drp] via OPHTHALMIC
  Administered 2018-05-21 – 2018-05-24 (×4): 1 [drp] via OPHTHALMIC
  Filled 2018-05-20: qty 15

## 2018-05-20 MED ORDER — LOSARTAN POTASSIUM 50 MG PO TABS
100.0000 mg | ORAL_TABLET | Freq: Every day | ORAL | Status: DC
  Administered 2018-05-20 – 2018-05-24 (×5): 100 mg via ORAL
  Filled 2018-05-20 (×5): qty 2

## 2018-05-20 MED ORDER — PREGABALIN 75 MG PO CAPS
75.0000 mg | ORAL_CAPSULE | Freq: Two times a day (BID) | ORAL | Status: DC
  Administered 2018-05-20 – 2018-05-24 (×8): 75 mg via ORAL
  Filled 2018-05-20 (×8): qty 1

## 2018-05-20 MED ORDER — MOMETASONE FURO-FORMOTEROL FUM 100-5 MCG/ACT IN AERO
2.0000 | INHALATION_SPRAY | Freq: Two times a day (BID) | RESPIRATORY_TRACT | Status: DC
  Administered 2018-05-20 – 2018-05-24 (×7): 2 via RESPIRATORY_TRACT
  Filled 2018-05-20: qty 8.8

## 2018-05-20 MED ORDER — VERAPAMIL HCL ER 120 MG PO TBCR
120.0000 mg | EXTENDED_RELEASE_TABLET | Freq: Every day | ORAL | Status: DC
  Administered 2018-05-20 – 2018-05-24 (×5): 120 mg via ORAL
  Filled 2018-05-20 (×6): qty 1

## 2018-05-20 MED ORDER — PRASUGREL HCL 10 MG PO TABS: 10.0000 mg | ORAL_TABLET | Freq: Every day | ORAL | Status: DC

## 2018-05-20 MED ORDER — MIRABEGRON ER 25 MG PO TB24
25.0000 mg | ORAL_TABLET | Freq: Every day | ORAL | Status: DC
  Administered 2018-05-21 – 2018-05-24 (×4): 25 mg via ORAL
  Filled 2018-05-20 (×6): qty 1

## 2018-05-20 MED ORDER — HYDROCHLOROTHIAZIDE 25 MG PO TABS
12.5000 mg | ORAL_TABLET | Freq: Every day | ORAL | Status: DC
  Administered 2018-05-21 – 2018-05-24 (×4): 12.5 mg via ORAL
  Filled 2018-05-20 (×4): qty 1

## 2018-05-20 MED ORDER — OCUVITE-LUTEIN PO CAPS
1.0000 | ORAL_CAPSULE | Freq: Two times a day (BID) | ORAL | Status: DC
  Administered 2018-05-20 – 2018-05-24 (×7): 1 via ORAL
  Filled 2018-05-20 (×11): qty 1

## 2018-05-20 MED ORDER — ENOXAPARIN SODIUM 100 MG/ML ~~LOC~~ SOLN
1.0000 mg/kg | Freq: Once | SUBCUTANEOUS | Status: AC
  Administered 2018-05-20: 95 mg via SUBCUTANEOUS
  Filled 2018-05-20: qty 1

## 2018-05-20 MED ORDER — PANTOPRAZOLE SODIUM 40 MG PO TBEC
40.0000 mg | DELAYED_RELEASE_TABLET | Freq: Every day | ORAL | Status: DC
  Administered 2018-05-21 – 2018-05-24 (×4): 40 mg via ORAL
  Filled 2018-05-20 (×4): qty 1

## 2018-05-20 MED ORDER — SODIUM CHLORIDE 0.9% FLUSH: 3.0000 mL | INTRAVENOUS | Status: DC | PRN

## 2018-05-20 MED ORDER — SITAGLIP PHOS-METFORMIN HCL ER 50-1000 MG PO TB24: 1.0000 | ORAL_TABLET | Freq: Two times a day (BID) | ORAL | Status: DC

## 2018-05-20 MED ORDER — VERAPAMIL HCL ER 180 MG PO TBCR
180.0000 mg | EXTENDED_RELEASE_TABLET | Freq: Every day | ORAL | Status: DC
  Administered 2018-05-21 – 2018-05-24 (×4): 180 mg via ORAL
  Filled 2018-05-20 (×6): qty 1

## 2018-05-20 MED ORDER — VERAPAMIL HCL ER 180 MG PO TBCR
300.0000 mg | EXTENDED_RELEASE_TABLET | Freq: Every day | ORAL | Status: DC
  Filled 2018-05-20 (×2): qty 1

## 2018-05-20 MED ORDER — FESOTERODINE FUMARATE ER 8 MG PO TB24
8.0000 mg | ORAL_TABLET | Freq: Every day | ORAL | Status: DC
  Administered 2018-05-24: 8 mg via ORAL
  Filled 2018-05-20 (×6): qty 1

## 2018-05-20 MED ORDER — SIMVASTATIN 20 MG PO TABS
20.0000 mg | ORAL_TABLET | Freq: Every day | ORAL | Status: DC
  Administered 2018-05-20: 20 mg via ORAL
  Filled 2018-05-20: qty 1

## 2018-05-20 MED ORDER — ACETAMINOPHEN 325 MG PO TABS
650.0000 mg | ORAL_TABLET | Freq: Four times a day (QID) | ORAL | Status: DC | PRN
  Administered 2018-05-23: 650 mg via ORAL
  Filled 2018-05-20: qty 2

## 2018-05-20 MED ORDER — ESTROGENS, CONJUGATED 0.625 MG/GM VA CREA
1.0000 | TOPICAL_CREAM | VAGINAL | Status: DC
  Administered 2018-05-20 – 2018-05-24 (×3): 1 via VAGINAL
  Filled 2018-05-20: qty 30

## 2018-05-20 MED ORDER — MELOXICAM 7.5 MG PO TABS
15.0000 mg | ORAL_TABLET | Freq: Every day | ORAL | Status: DC
  Administered 2018-05-21 – 2018-05-24 (×4): 15 mg via ORAL
  Filled 2018-05-20 (×4): qty 2

## 2018-05-20 NOTE — Progress Notes (Signed)
Advanced care plan.  Purpose of the Encounter: CODE STATUS  Parties in Attendance: Patient herself  Patient's Decision Capacity: Intact  Subjective/Patient's story:  Patient is 82 year old with history of coronary artery disease hypertension, diabetes presenting with chest pain  Objective/Medical story Due to her advanced age I discussed with the patient regarding her desires for cardiac and pulmonary resuscitation   Goals of care determination:   Patient states that she would like to be a full code would want everything to be done  CODE STATUS: Full code   Time spent discussing advanced care planning: 16 minutes

## 2018-05-20 NOTE — H&P (Signed)
[facility] NAME: Brandi [name]    MR#:  [id number]  DATE OF BIRTH:  Nov 22, 1935  DATE OF ADMISSION:  05/20/2018  PRIMARY CARE PHYSICIAN: Katheren Shams   REQUESTING/REFERRING PHYSICIAN: Carrie Mew, MD  CHIEF COMPLAINT:   Chief Complaint  Patient presents with  . Chest Pain    HISTORY OF PRESENT ILLNESS: Brandi [name]  is a 82 y.o. female with a known history of anxiety, coronary artery disease, diabetes type 2, GERD, hyperlipidemia, essential hypertension, sleep apnea presenting to the hospital with complaint of left-sided chest pain ongoing for the past 5 days.  He thought it was indigestion because it was in center of her chest.  He states that it felt like burning substernally.  She also had radiation of the pain to her jaw and arm.  Noted to have troponin that is 0.30.  Were asked to admit the patient.  She also had some shortness of breath denies any nausea or vomiting.      PAST MEDICAL HISTORY:   Past Medical History:  Diagnosis Date  . Anemia   . Anxiety   . Arthritis, degenerative   . Chronic cystitis   . Depression   . Diabetes mellitus without complication ([place])   . GERD (gastroesophageal reflux disease)   . Headache   . Heart disease   . HLD (hyperlipidemia)   . HTN (hypertension)   . Hypertension   . Incontinence in female   . Murmur   . Obesity   . Sleep apnea   . Urge incontinence of urine     PAST SURGICAL HISTORY:  Past Surgical History:  Procedure Laterality Date  . BREAST BIOPSY Right 2004   neg  . BUNIONECTOMY    . CARDIAC SURGERY     Stent  . CHOLECYSTECTOMY    . COLONOSCOPY WITH PROPOFOL N/A 06/14/2017   Procedure: COLONOSCOPY WITH PROPOFOL;  Surgeon: Lollie Sails, MD;  Location: [facility] ENDOSCOPY;  Service: Endoscopy;  Laterality: N/A;  . JOINT REPLACEMENT     TKR Bilateral  . LOOP RECORDER INSERTION N/A 09/20/2016   Procedure: Loop Recorder Insertion;  Surgeon: Isaias Cowman, MD;  Location: [place] [facility];  Service: Cardiovascular;  Laterality: N/A;  . SEPTOPLASTY      SOCIAL HISTORY:  Social History   Tobacco Use  . Smoking status: Never Smoker  . Smokeless tobacco: Never Used  Substance Use Topics  . Alcohol use: No    FAMILY HISTORY:  Family History  Problem Relation Age of Onset  . Breast cancer Cousin   . Skin cancer Unknown   . Diabetes Unknown   . Hypercholesterolemia Unknown   . Hypertension Unknown   . Bladder Cancer Maternal Aunt   . COPD Unknown   . Kidney disease Neg Hx   . Kidney cancer Neg Hx     DRUG ALLERGIES:  Allergies  Allergen Reactions  . Micardis [Telmisartan] Other (See Comments)    Dizziness  . Accupril [Quinapril Hcl] Rash  . Quinapril Rash    REVIEW OF SYSTEMS:   CONSTITUTIONAL: No fever, fatigue or weakness.  EYES: No blurred or double vision.  EARS, NOSE, AND THROAT: No tinnitus or ear pain.  RESPIRATORY: No cough, positive shortness of breath, wheezing or hemoptysis.  CARDIOVASCULAR: Positive chest pain, orthopnea, edema.  GASTROINTESTINAL: No nausea, vomiting, diarrhea or abdominal pain.  GENITOURINARY: No dysuria, hematuria.  ENDOCRINE: No polyuria, nocturia,  HEMATOLOGY: No anemia, easy bruising or bleeding SKIN:  No rash or lesion. MUSCULOSKELETAL: No joint pain or arthritis.   NEUROLOGIC: No tingling, numbness, weakness.  PSYCHIATRY: No anxiety or depression.   MEDICATIONS AT HOME:  Prior to Admission medications   Medication Sig Start Date End Date Taking? Authorizing Provider  aspirin EC 81 MG tablet Take 81 mg by mouth daily.    [provider]  budesonide-formoterol (SYMBICORT) 80-4.5 MCG/ACT inhaler Inhale 2 puffs into the lungs 2 (two) times daily.    [provider]  Calcium Carb-Cholecalciferol (CALCIUM + D3 PO) Take 1 tablet by mouth daily.    [provider]  carboxymethylcellulose (REFRESH TEARS) 0.5 % SOLN Apply 1-2 drops to eye at bedtime.      [provider]  carvedilol (COREG) 3.125 MG tablet Take 3.125 mg by mouth 2 (two) times daily with a meal.    [provider]  clopidogrel (PLAVIX) 75 MG tablet Take 75 mg by mouth daily.    [provider]  conjugated estrogens (PREMARIN) vaginal cream Place 1 Applicatorful vaginally daily. Apply 0.5mg  (pea-sized amount)  just inside the vaginal introitus with a finger-tip every night for two weeks and then Monday, Wednesday and Friday nights. 05/04/16   Zara Council A, PA-C  Cranberry 500 MG CHEW Chew 1 tablet by mouth daily.    [provider]  Cyanocobalamin (VITAMIN B-12 PO) Take 1 tablet by mouth daily.    [provider]  ferrous sulfate 325 (65 FE) MG tablet Take 325 mg by mouth daily with breakfast.     [provider]  fesoterodine (TOVIAZ) 8 MG TB24 tablet Take 1 tablet (8 mg total) by mouth daily. 09/20/17   Zara Council A, PA-C  glipiZIDE (GLUCOTROL) 5 MG tablet Take 5 mg by mouth 2 (two) times daily.  08/19/13   [provider]  hydrochlorothiazide (HYDRODIURIL) 12.5 MG tablet  12/02/17   [provider]  Insulin Glargine (LANTUS SOLOSTAR) 100 UNIT/ML Solostar Pen Inject 8 Units into the skin at bedtime.  07/20/15   [provider]  losartan (COZAAR) 100 MG tablet Take 100 mg by mouth daily.    [provider]  meloxicam (MOBIC) 15 MG tablet Take 15 mg by mouth daily.    [provider]  mirabegron ER (MYRBETRIQ) 25 MG TB24 tablet TAKE 1 TABLET(25 MG) BY MOUTH DAILY 06/20/17   McGowan, Larene Beach A, PA-C  Multiple Vitamins-Minerals (PRESERVISION/LUTEIN) CAPS Take 1 capsule by mouth 2 (two) times daily.  03/12/12   [provider]  MYRBETRIQ 25 MG TB24 tablet TAKE 1 TABLET(25 MG) BY MOUTH DAILY 03/04/18   McGowan, Larene Beach A, PA-C  ondansetron (ZOFRAN) 4 MG tablet Take 1 tablet (4 mg total) by mouth daily as needed for nausea or vomiting. 11/21/15   Earleen Newport, MD   pantoprazole (PROTONIX) 40 MG tablet Take 40 mg by mouth daily.    [provider]  polycarbophil (FIBER-CAPS) 625 MG tablet Take 625 mg by mouth daily.    [provider]  prasugrel (EFFIENT) 10 MG TABS tablet Take 10 mg by mouth daily.    [provider]  pregabalin (LYRICA) 75 MG capsule Take 75 mg by mouth 2 (two) times daily.    [provider]  simvastatin (ZOCOR) 40 MG tablet Take 20 mg by mouth daily at 6 PM.     [provider]  SitaGLIPtin-MetFORMIN HCl (JANUMET XR) 50-1000 MG TB24 Take 1 tablet by mouth 2 (two) times daily. 12/02/14   [provider]  Verapamil  HCl CR 300 MG CP24 Take 1 capsule by mouth daily.    [provider]      PHYSICAL EXAMINATION:   VITAL SIGNS: Blood pressure (!) 159/98, pulse 81, temperature 98.3 F (36.8 C), temperature source Oral, resp. rate 18, height 5\' 1"  (1.549 m), weight 94.3 kg, SpO2 97 %.  GENERAL:  82 y.o.-year-old patient lying in the bed with no acute distress.  EYES: Pupils equal, round, reactive to light and accommodation. No scleral icterus. Extraocular muscles intact.  HEENT: Head atraumatic, normocephalic. Oropharynx and nasopharynx clear.  NECK:  Supple, no jugular venous distention. No thyroid enlargement, no tenderness.  LUNGS: Normal breath sounds bilaterally, no wheezing, rales,rhonchi or crepitation. No use of accessory muscles of respiration.  CARDIOVASCULAR: S1, S2 normal. No murmurs, rubs, or gallops.  ABDOMEN: Soft, nontender, nondistended. Bowel sounds present. No organomegaly or mass.  EXTREMITIES: No pedal edema, cyanosis, or clubbing.  NEUROLOGIC: Cranial nerves II through XII are intact. Muscle strength 5/5 in all extremities. Sensation intact. Gait not checked.  PSYCHIATRIC: The patient is alert and oriented x 3.  SKIN: No obvious rash, lesion, or ulcer.   LABORATORY PANEL:   CBC Recent Labs  Lab 05/20/18 1220  WBC 7.2  HGB 13.3  HCT 41.6  PLT 240   MCV 80.5  MCH 25.7*  MCHC 32.0  RDW 14.3   ------------------------------------------------------------------------------------------------------------------  Chemistries  Recent Labs  Lab 05/20/18 1220  NA 135  K 4.0  CL 96*  CO2 25  GLUCOSE 264*  BUN 15  CREATININE 1.10*  CALCIUM 9.1   ------------------------------------------------------------------------------------------------------------------ estimated creatinine clearance is 42.8 mL/min (A) (by C-G formula based on SCr of 1.1 mg/dL (H)). ------------------------------------------------------------------------------------------------------------------ No results for input(s): TSH, T4TOTAL, T3FREE, THYROIDAB in the last 72 hours.  Invalid input(s): FREET3   Coagulation profile No results for input(s): INR, PROTIME in the last 168 hours. ------------------------------------------------------------------------------------------------------------------- No results for input(s): DDIMER in the last 72 hours. -------------------------------------------------------------------------------------------------------------------  Cardiac Enzymes Recent Labs  Lab 05/20/18 1220  TROPONINI 0.30*   ------------------------------------------------------------------------------------------------------------------ Invalid input(s): POCBNP  ---------------------------------------------------------------------------------------------------------------  Urinalysis    Component Value Date/Time   COLORURINE STRAW (A) 07/30/2016 1309   APPEARANCEUR Cloudy (A) 09/20/2017 1106   LABSPEC 1.005 07/30/2016 1309   LABSPEC 1.010 02/22/2012 1204   PHURINE 7.0 07/30/2016 1309   GLUCOSEU Negative 09/20/2017 1106   GLUCOSEU Negative 02/22/2012 1204   HGBUR NEGATIVE 07/30/2016 1309   BILIRUBINUR Negative 09/20/2017 1106   BILIRUBINUR Negative 02/22/2012 1204   KETONESUR NEGATIVE 07/30/2016 1309   PROTEINUR Trace (A) 09/20/2017 1106    PROTEINUR NEGATIVE 07/30/2016 1309   NITRITE Positive (A) 09/20/2017 1106   NITRITE NEGATIVE 07/30/2016 1309   LEUKOCYTESUR 3+ (A) 09/20/2017 1106   LEUKOCYTESUR 2+ 02/22/2012 1204     RADIOLOGY: Dg Chest 2 View  Result Date: 05/20/2018 CLINICAL DATA:  Chest pain/burning at night for several days. EXAM: CHEST - 2 VIEW COMPARISON:  Chest x-ray dated 04/14/2016. FINDINGS: Heart size and mediastinal contours are within normal limits. Loop recorder projecting over the LEFT heart border. Atherosclerotic changes noted at the aortic arch. Lungs are clear. No pleural effusion or pneumothorax seen. No acute or suspicious osseous finding. IMPRESSION: 1. No active cardiopulmonary disease. No evidence of pneumonia or pulmonary edema. 2. Aortic atherosclerosis. Electronically Signed   By: Franki Cabot M.D.   On: 05/20/2018 13:01    EKG: Orders placed or performed during the hospital encounter of 05/20/18  . EKG 12-Lead  . EKG 12-Lead  . ED EKG within  10 minutes  . ED EKG within 10 minutes    IMPRESSION AND PLAN: Patient is 82 year old presenting with chest pain  1.  Chest pain with history of coronary artery disease Elevated troponin We will start patient on full dose Lovenox Use nitroglycerin as needed Cardiology has been notified of admission further work-up per cardiology Trend trop Continue aspirin Plavix  2.  Hypertension continue HCTZ, Cozaar  3.  Diabetes type 2 Place patient on sliding scale insulin continue her oral medications as well as Lantus   4.  Sleep apnea CPAP machine has been ordered  5.  GERD continue Protonix  6.  Lipidemia continue Zocor   All the records are reviewed and case discussed with ED provider. Management plans discussed with the patient, family and they are in agreement.  CODE STATUS: Code Status History    Date Active Date Inactive Code Status Order ID Comments User Context   04/14/2016 2013 04/17/2016 1552 Full Code [id number]  Holley Raring, NP ED    Advance Directive Documentation     Most Recent Value  Type of Advance Directive  Healthcare Power of Attorney, Living will  Pre-existing out of facility DNR order (yellow form or pink MOST form)  -  "MOST" Form in Place?  -       TOTAL TIME TAKING CARE OF THIS PATIENT: 55 minutes.    Dustin Flock M.D on 05/20/2018 at 2:30 PM  Between 7am to 6pm - Pager - [phone]  After 6pm go to [email or web address] - Proofreader  [employer] [facility]  [phone]  CC: Primary care physician; Katheren Shams

## 2018-05-20 NOTE — Consult Note (Signed)
[facility] Card[facility] Consultation Note  Patient ID: Brandi [name], MRN: [id number], DOB/AGE: 07-25-1935 82 y.o. Admit date: 05/20/2018   Date of Consult: 05/20/2018 Primary Physician: Baxter Hire, MD Primary Cardiologist: Ubaldo Glassing  Chief Complaint:  Chief Complaint  Patient presents with  . Chest Pain   Reason for Consult: Chest pain  HPI: 82 y.o. female with known essential hypertension mixed hyperlipidemia sleep apnea diabetes chronic kidney disease stage III with some new onset of waxing and waning episodes of chest burning in the center of the chest especially when she lies back in her bed.  This occurring at rest and increasing in frequency and intensity but when up moving around walking she has some relief.  The patient was seen in the emergency room with an EKG of sinus tachycardia with a troponin level of 0.3.  She has spontaneous resolution of chest discomfort at this time and no current concerns.  The patient is hemodynamically stable  Past Medical History:  Diagnosis Date  . Anemia   . Anxiety   . Arthritis, degenerative   . Chronic cystitis   . Depression   . Diabetes mellitus without complication ([place])   . GERD (gastroesophageal reflux disease)   . Headache   . Heart disease   . HLD (hyperlipidemia)   . HTN (hypertension)   . Hypertension   . Incontinence in female   . Murmur   . Obesity   . Sleep apnea   . Urge incontinence of urine       Surgical History:  Past Surgical History:  Procedure Laterality Date  . BREAST BIOPSY Right 2004   neg  . BUNIONECTOMY    . CARDIAC SURGERY     Stent  . CHOLECYSTECTOMY    . COLONOSCOPY WITH PROPOFOL N/A 06/14/2017   Procedure: COLONOSCOPY WITH PROPOFOL;  Surgeon: Lollie Sails, MD;  Location: [facility] ENDOSCOPY;  Service: Endoscopy;  Laterality: N/A;  . JOINT REPLACEMENT     TKR Bilateral  . LOOP RECORDER INSERTION N/A 09/20/2016   Procedure: Loop Recorder Insertion;  Surgeon: Isaias Cowman, MD;  Location:  [place] [facility];  Service: Cardiovascular;  Laterality: N/A;  . SEPTOPLASTY       Home Meds: Prior to Admission medications   Medication Sig Start Date End Date Taking? Authorizing Provider  aspirin EC 81 MG tablet Take 81 mg by mouth daily.   Yes [provider]  budesonide-formoterol (SYMBICORT) 80-4.5 MCG/ACT inhaler Inhale 2 puffs into the lungs 2 (two) times daily.   Yes [provider]  Calcium Carb-Cholecalciferol (CALCIUM + D3 PO) Take 1 tablet by mouth daily.   Yes [provider]  carboxymethylcellulose (REFRESH TEARS) 0.5 % SOLN Apply 1-2 drops to eye at bedtime.    Yes [provider]  carvedilol (COREG) 3.125 MG tablet Take 3.125 mg by mouth 2 (two) times daily with a meal.   Yes [provider]  clopidogrel (PLAVIX) 75 MG tablet Take 75 mg by mouth daily.   Yes [provider]  conjugated estrogens (PREMARIN) vaginal cream Place 1 Applicatorful vaginally daily. Apply 0.5mg  (pea-sized amount)  just inside the vaginal introitus with a finger-tip every night for two weeks and then Monday, Wednesday and Friday nights. 05/04/16  Yes McGowan, Larene Beach A, PA-C  Cyanocobalamin (VITAMIN B-12 PO) Take 1 tablet by mouth daily.   Yes [provider]  glipiZIDE (GLUCOTROL) 5 MG tablet Take 5 mg by mouth 2 (two) times daily.  08/19/13  Yes [provider]  hydrochlorothiazide (HYDRODIURIL) 12.5 MG tablet  12/02/17  Yes [provider]  Insulin Glargine (LANTUS SOLOSTAR) 100 UNIT/ML Solostar Pen Inject 12 Units into the skin at bedtime.  07/20/15  Yes [provider]  losartan (COZAAR) 100 MG tablet Take 100 mg by mouth daily.   Yes [provider]  meloxicam (MOBIC) 15 MG tablet Take 15 mg by mouth daily.   Yes [provider]  mirabegron ER (MYRBETRIQ) 25 MG TB24 tablet TAKE 1 TABLET(25 MG) BY MOUTH DAILY Patient taking differently: Take 25 mg by mouth daily.  06/20/17  Yes McGowan, Larene Beach  A, PA-C  Multiple Vitamins-Minerals (PRESERVISION/LUTEIN) CAPS Take 1 capsule by mouth 2 (two) times daily.  03/12/12  Yes [provider]  pantoprazole (PROTONIX) 40 MG tablet Take 40 mg by mouth daily.   Yes [provider]  prasugrel (EFFIENT) 10 MG TABS tablet Take 10 mg by mouth daily.   Yes [provider]  simvastatin (ZOCOR) 20 MG tablet Take 20 mg by mouth daily at 6 PM.    Yes [provider]  SitaGLIPtin-MetFORMIN HCl (JANUMET XR) 50-1000 MG TB24 Take 1 tablet by mouth 2 (two) times daily. 12/02/14  Yes [provider]  Verapamil HCl CR 300 MG CP24 Take 1 capsule by mouth daily.   Yes [provider]  fesoterodine (TOVIAZ) 8 MG TB24 tablet Take 1 tablet (8 mg total) by mouth daily. Patient not taking: Reported on 05/20/2018 09/20/17   Zara Council A, PA-C  pregabalin (LYRICA) 75 MG capsule Take 75 mg by mouth 2 (two) times daily.    [provider]    Inpatient Medications:  . [START ON 05/21/2018] enoxaparin (LOVENOX) injection  95 mg Subcutaneous Q12H  . insulin aspart  0-9 Units Subcutaneous TID WC     Allergies:  Allergies  Allergen Reactions  . Micardis [Telmisartan] Other (See Comments)    Dizziness  . Accupril [Quinapril Hcl] Rash  . Quinapril Rash    Social History   Socioeconomic History  . Marital status: Widowed    Spouse name: Not on file  . Number of children: Not on file  . Years of education: Not on file  . Highest education level: Not on file  Occupational History  . Not on file  Social Needs  . Financial resource strain: Not on file  . Food insecurity:    Worry: Not on file    Inability: Not on file  . Transportation needs:    Medical: Not on file    Non-medical: Not on file  Tobacco Use  . Smoking status: Never Smoker  . Smokeless tobacco: Never Used  Substance and Sexual Activity  . Alcohol use: No  . Drug use: No  . Sexual activity: Not on file  Lifestyle  . Physical  activity:    Days per week: Not on file    Minutes per session: Not on file  . Stress: Not on file  Relationships  . Social connections:    Talks on phone: Not on file    Gets together: Not on file    Attends religious service: Not on file    Active member of club or organization: Not on file    Attends meetings of clubs or organizations: Not on file    Relationship status: Not on file  . Intimate partner violence:    Fear of current or ex partner: Not on file    Emotionally abused: Not on file    Physically  abused: Not on file    Forced sexual activity: Not on file  Other Topics Concern  . Not on file  Social History Narrative  . Not on file     Family History  Problem Relation Age of Onset  . Breast cancer Cousin   . Skin cancer Unknown   . Diabetes Unknown   . Hypercholesterolemia Unknown   . Hypertension Unknown   . Bladder Cancer Maternal Aunt   . COPD Unknown   . Kidney disease Neg Hx   . Kidney cancer Neg Hx      Review of Systems Positive for heartburn burning chest pain Negative for: General:  chills, fever, night sweats or weight changes.  Cardiovascular: PND orthopnea syncope dizziness  Dermatological skin lesions rashes Respiratory: Cough congestion Urologic: Frequent urination urination at night and hematuria Abdominal: negative for nausea, vomiting, diarrhea, bright red blood per rectum, melena, or hematemesis Neurologic: negative for visual changes, and/or hearing changes  All other systems reviewed and are otherwise negative except as noted above.  Labs: Recent Labs    05/20/18 1220  TROPONINI 0.30*   Lab Results  Component Value Date   WBC 7.2 05/20/2018   HGB 13.3 05/20/2018   HCT 41.6 05/20/2018   MCV 80.5 05/20/2018   PLT 240 05/20/2018    Recent Labs  Lab 05/20/18 1220  NA 135  K 4.0  CL 96*  CO2 25  BUN 15  CREATININE 1.10*  CALCIUM 9.1  GLUCOSE 264*   Lab Results  Component Value Date   CHOL 141 06/30/2011   HDL 63  (H) 06/30/2011   LDLCALC 64 06/30/2011   TRIG 71 06/30/2011   No results found for: DDIMER  Radiology/Studies:  Dg Chest 2 View  Result Date: 05/20/2018 CLINICAL DATA:  Chest pain/burning at night for several days. EXAM: CHEST - 2 VIEW COMPARISON:  Chest x-ray dated 04/14/2016. FINDINGS: Heart size and mediastinal contours are within normal limits. Loop recorder projecting over the LEFT heart border. Atherosclerotic changes noted at the aortic arch. Lungs are clear. No pleural effusion or pneumothorax seen. No acute or suspicious osseous finding. IMPRESSION: 1. No active cardiopulmonary disease. No evidence of pneumonia or pulmonary edema. 2. Aortic atherosclerosis. Electronically Signed   By: Franki Cabot M.D.   On: 05/20/2018 13:01    EKG: This tachycardia  Weights: Filed Weights   05/20/18 1217  Weight: 94.3 kg     Physical Exam: Blood pressure (!) 215/90, pulse 78, temperature 97.8 F (36.6 C), temperature source Oral, resp. rate 18, height 5\' 1"  (1.549 m), weight 94.3 kg, SpO2 96 %. Body mass index is 39.3 kg/m. General: Well developed, well nourished, in no acute distress. Head eyes ears nose throat: Normocephalic, atraumatic, sclera non-icteric, no xanthomas, nares are without discharge. No apparent thyromegaly and/or mass  Lungs: Normal respiratory effort.  no wheezes, no rales, no rhonchi.  Heart: RRR with normal S1 S2. no murmur gallop, no rub, PMI is normal size and placement, carotid upstroke normal without bruit, jugular venous pressure is normal Abdomen: Soft, non-tender, non-distended with normoactive bowel sounds. No hepatomegaly. No rebound/guarding. No obvious abdominal masses. Abdominal aorta is normal size without bruit Extremities: No edema. no cyanosis, no clubbing, no ulcers  Peripheral : 2+ bilateral upper extremity pulses, 2+ bilateral femoral pulses, 2+ bilateral dorsal pedal pulse Neuro: Alert and oriented. No facial asymmetry. No focal deficit. Moves all  extremities spontaneously. Musculoskeletal: Normal muscle tone without kyphosis Psych:  Responds to questions appropriately with a normal  affect.    Assessment: 82 year old female with chronic kidney disease stage III essential hypertension mixed hyperlipidemia sleep apnea with acute burning in her chest atypical in nature for cardiovascular disease with an elevated troponin and normal EKG without evidence of heart failure  Plan: 1.  Serial ECG and enzymes to assess for possible myocardial infarction 2.  Continue hypertension and lipid management as before 3.  Nitrates for chest pain as necessary 4.  Possible stress test if no further issues of chest pain and no elevation of troponin although if further concerns would proceed to cardiac catheterization to assess coronary anatomy and further treatment thereof is necessary.  Patient understands risk and benefits of cardiac catheterization.  This includes a possibility of death stroke heart attack infection bleeding or blood clot.  She is at low risk for conscious sedation  Signed, Corey Skains M.D. [facility] 05/20/2018, 5:11 PM

## 2018-05-20 NOTE — ED Triage Notes (Signed)
Pt sent here from [facility] with c/o chest burning every night she lays down to go to bed for the past few days, denies any pain during the day, states she has been on acid reflux medicine for years. Denies pain at this time, NAD.

## 2018-05-20 NOTE — ED Provider Notes (Addendum)
[facility] Emergency Department Provider Note  ____________________________________________  Time seen: Approximately 2:22 PM  I have reviewed the triage vital signs and the nursing notes.   HISTORY  Chief Complaint Chest Pain    HPI Brandi [name] is a 82 y.o. female with a history of GERD hypertension hyperlipidemia and diabetes who comes to the ED complaining of intermittent chest pain for the past 5 days.  She has symptoms both with walking and trying to put on her clothes which also causes her to feel dizzy and near syncopal, she is also had intermittent symptoms at night over the past 5 days burning chest pain radiating to the left arm.    She does not wake up with sore throat or cough.  She does feel short of breath.   She has a history of coronary stents, followed by Dr. Ubaldo Glassing of cardiology.  She reports that she is scheduled to have a stress test tomorrow.  Patient reports currently she is chest pain-free and symptoms have resolved for the moment.   Past Medical History:  Diagnosis Date  . Anemia   . Anxiety   . Arthritis, degenerative   . Chronic cystitis   . Depression   . Diabetes mellitus without complication ([place])   . GERD (gastroesophageal reflux disease)   . Headache   . Heart disease   . HLD (hyperlipidemia)   . HTN (hypertension)   . Hypertension   . Incontinence in female   . Murmur   . Obesity   . Sleep apnea   . Urge incontinence of urine      Patient Active Problem List   Diagnosis Date Noted  . Positional lightheadedness 03/14/2017  . Diabetes mellitus type 2, insulin dependent ([place]) 03/14/2017  . Action tremor 09/11/2016  . Headache disorder 09/11/2016  . Severe sepsis ([place]) 04/14/2016  . Orthostatic hypotension 11/10/2015  . Diabetic neuropathy ([place]) 10/06/2015  . Recurrent UTI 09/13/2015  . Cystocele, grade 3 09/13/2015  . Incontinence 09/13/2015  . Depression, major, in remission ([place]) 12/23/2014  . Pernicious  anemia 12/23/2014  . Coronary arteriosclerosis 01/06/2014  . Sleep apnea in adult 01/06/2014  . Benign essential hypertension 12/01/2013  . Obesity, unspecified 12/01/2013  . Acquired cyst of kidney 05/02/2013  . Chronic cystitis 03/11/2012     Past Surgical History:  Procedure Laterality Date  . BREAST BIOPSY Right 2004   neg  . BUNIONECTOMY    . CARDIAC SURGERY     Stent  . CHOLECYSTECTOMY    . COLONOSCOPY WITH PROPOFOL N/A 06/14/2017   Procedure: COLONOSCOPY WITH PROPOFOL;  Surgeon: Lollie Sails, MD;  Location: [facility] ENDOSCOPY;  Service: Endoscopy;  Laterality: N/A;  . JOINT REPLACEMENT     TKR Bilateral  . LOOP RECORDER INSERTION N/A 09/20/2016   Procedure: Loop Recorder Insertion;  Surgeon: Isaias Cowman, MD;  Location: [facility] LAB;  Service: Cardiovascular;  Laterality: N/A;  . SEPTOPLASTY       Prior to Admission medications   Medication Sig Start Date End Date Taking? Authorizing Provider  aspirin EC 81 MG tablet Take 81 mg by mouth daily.    [provider]  budesonide-formoterol (SYMBICORT) 80-4.5 MCG/ACT inhaler Inhale 2 puffs into the lungs 2 (two) times daily.    [provider]  Calcium Carb-Cholecalciferol (CALCIUM + D3 PO) Take 1 tablet by mouth daily.    [provider]  carboxymethylcellulose (REFRESH TEARS) 0.5 % SOLN Apply 1-2 drops to eye at bedtime.  [provider]  carvedilol (COREG) 3.125 MG tablet Take 3.125 mg by mouth 2 (two) times daily with a meal.    [provider]  clopidogrel (PLAVIX) 75 MG tablet Take 75 mg by mouth daily.    [provider]  conjugated estrogens (PREMARIN) vaginal cream Place 1 Applicatorful vaginally daily. Apply 0.5mg  (pea-sized amount)  just inside the vaginal introitus with a finger-tip every night for two weeks and then Monday, Wednesday and Friday nights. 05/04/16   Zara Council A, PA-C  Cranberry 500 MG CHEW Chew 1 tablet by mouth daily.     [provider]  Cyanocobalamin (VITAMIN B-12 PO) Take 1 tablet by mouth daily.    [provider]  ferrous sulfate 325 (65 FE) MG tablet Take 325 mg by mouth daily with breakfast.     [provider]  fesoterodine (TOVIAZ) 8 MG TB24 tablet Take 1 tablet (8 mg total) by mouth daily. 09/20/17   Zara Council A, PA-C  glipiZIDE (GLUCOTROL) 5 MG tablet Take 5 mg by mouth 2 (two) times daily.  08/19/13   [provider]  hydrochlorothiazide (HYDRODIURIL) 12.5 MG tablet  12/02/17   [provider]  Insulin Glargine (LANTUS SOLOSTAR) 100 UNIT/ML Solostar Pen Inject 8 Units into the skin at bedtime.  07/20/15   [provider]  losartan (COZAAR) 100 MG tablet Take 100 mg by mouth daily.    [provider]  meloxicam (MOBIC) 15 MG tablet Take 15 mg by mouth daily.    [provider]  mirabegron ER (MYRBETRIQ) 25 MG TB24 tablet TAKE 1 TABLET(25 MG) BY MOUTH DAILY 06/20/17   McGowan, Larene Beach A, PA-C  Multiple Vitamins-Minerals (PRESERVISION/LUTEIN) CAPS Take 1 capsule by mouth 2 (two) times daily.  03/12/12   [provider]  MYRBETRIQ 25 MG TB24 tablet TAKE 1 TABLET(25 MG) BY MOUTH DAILY 03/04/18   McGowan, Larene Beach A, PA-C  ondansetron (ZOFRAN) 4 MG tablet Take 1 tablet (4 mg total) by mouth daily as needed for nausea or vomiting. 11/21/15   Earleen Newport, MD  pantoprazole (PROTONIX) 40 MG tablet Take 40 mg by mouth daily.    [provider]  polycarbophil (FIBER-CAPS) 625 MG tablet Take 625 mg by mouth daily.    [provider]  prasugrel (EFFIENT) 10 MG TABS tablet Take 10 mg by mouth daily.    [provider]  pregabalin (LYRICA) 75 MG capsule Take 75 mg by mouth 2 (two) times daily.    [provider]  simvastatin (ZOCOR) 40 MG tablet Take 20 mg by mouth daily at 6 PM.     [provider]  SitaGLIPtin-MetFORMIN HCl (JANUMET XR) 50-1000 MG TB24 Take 1 tablet by mouth 2 (two) times  daily. 12/02/14   [provider]  Verapamil HCl CR 300 MG CP24 Take 1 capsule by mouth daily.    [provider]     Allergies Micardis [telmisartan]; Accupril [quinapril hcl]; and Quinapril   Family History  Problem Relation Age of Onset  . Breast cancer Cousin   . Skin cancer Unknown   . Diabetes Unknown   . Hypercholesterolemia Unknown   . Hypertension Unknown   . Bladder Cancer Maternal Aunt   . COPD Unknown   . Kidney disease Neg Hx   . Kidney cancer Neg Hx     Social History Social History   Tobacco Use  . Smoking status: Never Smoker  . Smokeless tobacco: Never Used  Substance Use Topics  .  Alcohol use: No  . Drug use: No    Review of Systems  Constitutional:   No fever or chills.  ENT:   No sore throat. No rhinorrhea. Cardiovascular:   Positive as above for chest pain and near syncope. Respiratory: Positive intermittent shortness of breath without cough. Gastrointestinal:   Negative for abdominal pain, vomiting and diarrhea.  Musculoskeletal:   Negative for focal pain or swelling All other systems reviewed and are negative except as documented above in ROS and HPI.  ____________________________________________   PHYSICAL EXAM:  VITAL SIGNS: ED Triage Vitals  Enc Vitals Group     BP 05/20/18 1216 (!) 155/81     Pulse Rate 05/20/18 1216 91     Resp 05/20/18 1216 18     Temp 05/20/18 1216 98.3 F (36.8 C)     Temp Source 05/20/18 1216 Oral     SpO2 05/20/18 1216 96 %     Weight 05/20/18 1217 208 lb (94.3 kg)     Height 05/20/18 1217 5\' 1"  (1.549 m)     Head Circumference --      Peak Flow --      Pain Score 05/20/18 1217 0     Pain Loc --      Pain Edu? --      Excl. in [place]? --     Vital signs reviewed, nursing assessments reviewed.   Constitutional:   Alert and oriented. Non-toxic appearance. Eyes:   Conjunctivae are normal. EOMI. PERRL. ENT      Head:   Normocephalic and atraumatic.      Nose:   No  congestion/rhinnorhea.       Mouth/Throat:   MMM, no pharyngeal erythema. No peritonsillar mass.       Neck:   No meningismus. Full ROM. Hematological/Lymphatic/Immunilogical:   No cervical lymphadenopathy. Cardiovascular:   RRR. Symmetric bilateral radial and DP pulses.  No murmurs. Cap refill less than 2 seconds. Respiratory:   Normal respiratory effort without tachypnea/retractions. Breath sounds are clear and equal bilaterally. No wheezes/rales/rhonchi. Gastrointestinal:   Soft and nontender. Non distended. There is no CVA tenderness.  No rebound, rigidity, or guarding.  Musculoskeletal:   Normal range of motion in all extremities. No joint effusions.  No lower extremity tenderness.  No edema. Neurologic:   Normal speech and language.  Motor grossly intact. No acute focal neurologic deficits are appreciated.  Skin:    Skin is warm, dry and intact. No rash noted.  No petechiae, purpura, or bullae.  ____________________________________________    LABS (pertinent positives/negatives) (all labs ordered are listed, but only abnormal results are displayed) Labs Reviewed  BASIC METABOLIC PANEL - Abnormal; Notable for the following components:      Result Value   Chloride 96 (*)    Glucose, Bld 264 (*)    Creatinine, Ser 1.10 (*)    GFR calc non Af Amer 47 (*)    GFR calc Af Amer 55 (*)    All other components within normal limits  CBC - Abnormal; Notable for the following components:   RBC 5.17 (*)    MCH 25.7 (*)    All other components within normal limits  TROPONIN I - Abnormal; Notable for the following components:   Troponin I 0.30 (*)    All other components within normal limits   ____________________________________________   EKG  Interpreted by me Sinus rhythm rate of 100, normal axis and intervals.  Prominent Q wave in V2.  Normal ST segments and T  waves.  ____________________________________________    RADIOLOGY  Dg Chest 2 View  Result Date:  05/20/2018 CLINICAL DATA:  Chest pain/burning at night for several days. EXAM: CHEST - 2 VIEW COMPARISON:  Chest x-ray dated 04/14/2016. FINDINGS: Heart size and mediastinal contours are within normal limits. Loop recorder projecting over the LEFT heart border. Atherosclerotic changes noted at the aortic arch. Lungs are clear. No pleural effusion or pneumothorax seen. No acute or suspicious osseous finding. IMPRESSION: 1. No active cardiopulmonary disease. No evidence of pneumonia or pulmonary edema. 2. Aortic atherosclerosis. Electronically Signed   By: Franki Cabot M.D.   On: 05/20/2018 13:01    ____________________________________________   PROCEDURES .Critical Care Performed by: Carrie Mew, MD Authorized by: Carrie Mew, MD   Critical care provider statement:    Critical care time (minutes):  30   Critical care time was exclusive of:  Separately billable procedures and treating other patients   Critical care was necessary to treat or prevent imminent or life-threatening deterioration of the following conditions:  Cardiac failure   Critical care was time spent personally by me on the following activities:  Development of treatment plan with patient or surrogate, discussions with consultants, evaluation of patient's response to treatment, examination of patient, obtaining history from patient or surrogate, ordering and performing treatments and interventions, ordering and review of laboratory studies, ordering and review of radiographic studies, pulse oximetry, re-evaluation of patient's condition and review of old charts    ____________________________________________  DIFFERENTIAL DIAGNOSIS   Non-STEMI, GERD/gastritis  CLINICAL IMPRESSION / [place] / ED COURSE  Pertinent labs & imaging results that were available during my care of the patient were reviewed by me and considered in my medical decision making (see chart for details).    Patient presents with  multiple episodes of chest pain with an exertional component although somewhat atypical.  She has multiple risk factors including comorbidities and age and known CAD.  Troponin elevated to 0.30 compared to negative a year ago, all concerning for non-STEMI.    Clinical Course as of May 20 1421  Mon May 20, 2018  1415 Elevated trop in setting of concerning cp. Currently sx free. Will give iv lovenox, plan to admit for further cardiac eval  Troponin I(!!): 0.30 [PS]    Clinical Course User Index [PS] Carrie Mew, MD     ____________________________________________   FINAL CLINICAL IMPRESSION(S) / ED DIAGNOSES    Final diagnoses:  Precordial pain  NSTEMI (non-ST elevated myocardial infarction) [facility])     ED Discharge Orders    None      Portions of this note were generated with dragon dictation software. Dictation errors may occur despite best attempts at proofreading.    Carrie Mew, MD 05/20/18 [place]    Carrie Mew, MD 05/20/18 [phone]

## 2018-05-20 NOTE — Consult Note (Signed)
ANTICOAGULATION CONSULT NOTE - Initial Consult  Pharmacy Consult for Lovenox Indication: chest pain/ACS (note indicates suspected NSTEMI)  Allergies  Allergen Reactions  . Micardis [Telmisartan] Other (See Comments)    Dizziness  . Accupril [Quinapril Hcl] Rash  . Quinapril Rash    Patient Measurements: Height: 5\' 1"  (154.9 cm) Weight: 208 lb (94.3 kg) IBW/kg (Calculated) : 47.8   Vital Signs: Temp: 98.3 F (36.8 C) (12/02 1216) Temp Source: Oral (12/02 1216) BP: 159/98 (12/02 1424) Pulse Rate: 81 (12/02 1424)  Labs: Recent Labs    05/20/18 1220  HGB 13.3  HCT 41.6  PLT 240  CREATININE 1.10*  TROPONINI 0.30*    Estimated Creatinine Clearance: 42.8 mL/min (A) (by C-G formula based on SCr of 1.1 mg/dL (H)).   Medical History: Past Medical History:  Diagnosis Date  . Anemia   . Anxiety   . Arthritis, degenerative   . Chronic cystitis   . Depression   . Diabetes mellitus without complication ([place])   . GERD (gastroesophageal reflux disease)   . Headache   . Heart disease   . HLD (hyperlipidemia)   . HTN (hypertension)   . Hypertension   . Incontinence in female   . Murmur   . Obesity   . Sleep apnea   . Urge incontinence of urine     Medications:  ASA 81mg , carvedilol 3.125mg , Prasugrel  Assessment: Patient is being treated with Lovenox therapy for NSTEMI  Goal of Therapy:  Monitor platelets by anticoagulation protocol: Yes  Plan:   Lovenox dosed at 1mg /kg q12hr at actual body weight (95mg  q 12) Will monitor CBCs and Scr daily for any dosing adjustments  Lu Duffel, PharmD Clinical Pharmacist 05/20/2018 3:07 PM

## 2018-05-20 NOTE — ED Notes (Signed)
Patient reports burning in her chest when she lies down to go to sleep since Wednesday.  Patient states she has felt nauseous but has not vomited.  Patient states she has had pain every night when she has tried to sleep since Wednesday.  Patient states, "I thought this was just indigestion."  Patient reports history of acid reflux and states she takes medication for it.

## 2018-05-21 ENCOUNTER — Observation Stay: Payer: Medicare Other

## 2018-05-21 DIAGNOSIS — E1122 Type 2 diabetes mellitus with diabetic chronic kidney disease: Secondary | ICD-10-CM | POA: Diagnosis present

## 2018-05-21 DIAGNOSIS — E782 Mixed hyperlipidemia: Secondary | ICD-10-CM | POA: Diagnosis present

## 2018-05-21 DIAGNOSIS — N3020 Other chronic cystitis without hematuria: Secondary | ICD-10-CM | POA: Diagnosis present

## 2018-05-21 DIAGNOSIS — I25110 Atherosclerotic heart disease of native coronary artery with unstable angina pectoris: Secondary | ICD-10-CM | POA: Diagnosis present

## 2018-05-21 DIAGNOSIS — R072 Precordial pain: Secondary | ICD-10-CM | POA: Diagnosis present

## 2018-05-21 DIAGNOSIS — Z7951 Long term (current) use of inhaled steroids: Secondary | ICD-10-CM | POA: Diagnosis not present

## 2018-05-21 DIAGNOSIS — G4730 Sleep apnea, unspecified: Secondary | ICD-10-CM | POA: Diagnosis present

## 2018-05-21 DIAGNOSIS — Z955 Presence of coronary angioplasty implant and graft: Secondary | ICD-10-CM | POA: Diagnosis not present

## 2018-05-21 DIAGNOSIS — Z7902 Long term (current) use of antithrombotics/antiplatelets: Secondary | ICD-10-CM | POA: Diagnosis not present

## 2018-05-21 DIAGNOSIS — Z79890 Hormone replacement therapy (postmenopausal): Secondary | ICD-10-CM | POA: Diagnosis not present

## 2018-05-21 DIAGNOSIS — N3941 Urge incontinence: Secondary | ICD-10-CM | POA: Diagnosis present

## 2018-05-21 DIAGNOSIS — R7989 Other specified abnormal findings of blood chemistry: Secondary | ICD-10-CM | POA: Diagnosis present

## 2018-05-21 DIAGNOSIS — Z794 Long term (current) use of insulin: Secondary | ICD-10-CM | POA: Diagnosis not present

## 2018-05-21 DIAGNOSIS — I200 Unstable angina: Secondary | ICD-10-CM | POA: Diagnosis present

## 2018-05-21 DIAGNOSIS — N183 Chronic kidney disease, stage 3 (moderate): Secondary | ICD-10-CM | POA: Diagnosis present

## 2018-05-21 DIAGNOSIS — Z79899 Other long term (current) drug therapy: Secondary | ICD-10-CM | POA: Diagnosis not present

## 2018-05-21 DIAGNOSIS — Z888 Allergy status to other drugs, medicaments and biological substances status: Secondary | ICD-10-CM | POA: Diagnosis not present

## 2018-05-21 DIAGNOSIS — Z96653 Presence of artificial knee joint, bilateral: Secondary | ICD-10-CM | POA: Diagnosis present

## 2018-05-21 DIAGNOSIS — E669 Obesity, unspecified: Secondary | ICD-10-CM | POA: Diagnosis present

## 2018-05-21 DIAGNOSIS — I129 Hypertensive chronic kidney disease with stage 1 through stage 4 chronic kidney disease, or unspecified chronic kidney disease: Secondary | ICD-10-CM | POA: Diagnosis present

## 2018-05-21 DIAGNOSIS — M1990 Unspecified osteoarthritis, unspecified site: Secondary | ICD-10-CM | POA: Diagnosis present

## 2018-05-21 DIAGNOSIS — Y712 Prosthetic and other implants, materials and accessory cardiovascular devices associated with adverse incidents: Secondary | ICD-10-CM | POA: Diagnosis present

## 2018-05-21 DIAGNOSIS — I214 Non-ST elevation (NSTEMI) myocardial infarction: Secondary | ICD-10-CM | POA: Diagnosis present

## 2018-05-21 DIAGNOSIS — K219 Gastro-esophageal reflux disease without esophagitis: Secondary | ICD-10-CM | POA: Diagnosis present

## 2018-05-21 DIAGNOSIS — Z7982 Long term (current) use of aspirin: Secondary | ICD-10-CM | POA: Diagnosis not present

## 2018-05-21 DIAGNOSIS — T82855A Stenosis of coronary artery stent, initial encounter: Secondary | ICD-10-CM | POA: Diagnosis present

## 2018-05-21 DIAGNOSIS — R9439 Abnormal result of other cardiovascular function study: Secondary | ICD-10-CM | POA: Diagnosis present

## 2018-05-21 LAB — CBC
HCT: 38.3 % (ref 36.0–46.0)
Hemoglobin: 12.1 g/dL (ref 12.0–15.0)
MCH: 25.8 pg — ABNORMAL LOW (ref 26.0–34.0)
MCHC: 31.6 g/dL (ref 30.0–36.0)
MCV: 81.7 fL (ref 80.0–100.0)
Platelets: 224 10*3/uL (ref 150–400)
RBC: 4.69 MIL/uL (ref 3.87–5.11)
RDW: 14.2 % (ref 11.5–15.5)
WBC: 5.5 10*3/uL (ref 4.0–10.5)
nRBC: 0 % (ref 0.0–0.2)

## 2018-05-21 LAB — NM MYOCAR MULTI W/SPECT W/WALL MOTION / EF
CHL CUP RESTING HR STRESS: 75 {beats}/min
CSEPED: 1 min
CSEPEW: 1 METS
Exercise duration (sec): 0 s
LV dias vol: 57 mL (ref 46–106)
LV sys vol: 19 mL
MPHR: 140 {beats}/min
Peak HR: 125 {beats}/min
Percent HR: 89 %
SDS: 5
SRS: 2
SSS: 0
TID: 1.02

## 2018-05-21 LAB — CREATININE, SERUM
Creatinine, Ser: 0.9 mg/dL (ref 0.44–1.00)
GFR calc Af Amer: >60 mL/min (ref >60)
GFR calc non Af Amer: >60 mL/min (ref >60)

## 2018-05-21 LAB — TROPONIN I
Troponin I: 0.22 ng/mL (ref <0.03)
Troponin I: 0.24 ng/mL (ref <0.03)

## 2018-05-21 LAB — GLUCOSE, CAPILLARY
Glucose-Capillary: 140 mg/dL — ABNORMAL HIGH (ref 70–99)
Glucose-Capillary: 224 mg/dL — ABNORMAL HIGH (ref 70–99)
Glucose-Capillary: 183 mg/dL — ABNORMAL HIGH (ref 70–99)

## 2018-05-21 MED ORDER — SODIUM CHLORIDE 0.9 % WEIGHT BASED INFUSION
3.0000 mL/kg/h | INTRAVENOUS | Status: AC
  Administered 2018-05-22: 3 mL/kg/h via INTRAVENOUS

## 2018-05-21 MED ORDER — LORATADINE 10 MG PO TABS
10.0000 mg | ORAL_TABLET | Freq: Every day | ORAL | Status: DC
  Administered 2018-05-21 – 2018-05-24 (×4): 10 mg via ORAL
  Filled 2018-05-21 (×4): qty 1

## 2018-05-21 MED ORDER — HYDRALAZINE HCL 20 MG/ML IJ SOLN
10.0000 mg | INTRAMUSCULAR | Status: DC | PRN
  Administered 2018-05-21 – 2018-05-23 (×2): 10 mg via INTRAVENOUS
  Filled 2018-05-21 (×2): qty 1

## 2018-05-21 MED ORDER — NITROGLYCERIN 2 % TD OINT
1.0000 [in_us] | TOPICAL_OINTMENT | Freq: Four times a day (QID) | TRANSDERMAL | Status: DC
  Administered 2018-05-21 – 2018-05-24 (×14): 1 [in_us] via TOPICAL
  Filled 2018-05-21 (×15): qty 1

## 2018-05-21 MED ORDER — SODIUM CHLORIDE 0.9 % WEIGHT BASED INFUSION: 1.0000 mL/kg/h | INTRAVENOUS | Status: DC

## 2018-05-21 MED ORDER — ASPIRIN 81 MG PO CHEW
81.0000 mg | CHEWABLE_TABLET | ORAL | Status: AC
  Administered 2018-05-22: 81 mg via ORAL
  Filled 2018-05-21: qty 1

## 2018-05-21 MED ORDER — TECHNETIUM TC 99M TETROFOSMIN IV KIT
10.0000 | PACK | Freq: Once | INTRAVENOUS | Status: AC | PRN
  Administered 2018-05-21: 10.08 via INTRAVENOUS

## 2018-05-21 MED ORDER — REGADENOSON 0.4 MG/5ML IV SOLN
0.4000 mg | Freq: Once | INTRAVENOUS | Status: AC
  Administered 2018-05-21: 0.4 mg via INTRAVENOUS

## 2018-05-21 MED ORDER — SODIUM CHLORIDE 0.9% FLUSH: 3.0000 mL | INTRAVENOUS | Status: DC | PRN

## 2018-05-21 MED ORDER — TECHNETIUM TC 99M TETROFOSMIN IV KIT
30.0000 | PACK | Freq: Once | INTRAVENOUS | Status: AC | PRN
  Administered 2018-05-21: 29.47 via INTRAVENOUS

## 2018-05-21 MED ORDER — NITROGLYCERIN 0.4 MG SL SUBL
0.4000 mg | SUBLINGUAL_TABLET | SUBLINGUAL | Status: DC | PRN
  Administered 2018-05-21 – 2018-05-24 (×5): 0.4 mg via SUBLINGUAL
  Filled 2018-05-21 (×2): qty 1

## 2018-05-21 MED ORDER — SODIUM CHLORIDE 0.9% FLUSH
3.0000 mL | Freq: Two times a day (BID) | INTRAVENOUS | Status: DC
  Administered 2018-05-21: 3 mL via INTRAVENOUS

## 2018-05-21 MED ORDER — SODIUM CHLORIDE 0.9 % IV SOLN: 250.0000 mL | INTRAVENOUS | Status: DC | PRN

## 2018-05-21 MED ORDER — INSULIN GLARGINE 100 UNIT/ML ~~LOC~~ SOLN
9.0000 [IU] | Freq: Every day | SUBCUTANEOUS | Status: DC
  Administered 2018-05-22 – 2018-05-24 (×3): 9 [IU] via SUBCUTANEOUS
  Filled 2018-05-21 (×4): qty 0.09

## 2018-05-21 MED ORDER — INSULIN GLARGINE 100 UNIT/ML ~~LOC~~ SOLN
12.0000 [IU] | Freq: Every day | SUBCUTANEOUS | Status: DC
  Filled 2018-05-21: qty 0.12

## 2018-05-21 MED ORDER — ATORVASTATIN CALCIUM 10 MG PO TABS
10.0000 mg | ORAL_TABLET | Freq: Every day | ORAL | Status: DC
  Administered 2018-05-21 – 2018-05-24 (×4): 10 mg via ORAL
  Filled 2018-05-21 (×4): qty 1

## 2018-05-21 NOTE — Progress Notes (Signed)
[facility] Encounter Note  Patient: Brandi [name] / Admit Date: 05/20/2018 / Date of Encounter: 05/21/2018, 1:38 PM   Subjective: Patient had full resolution of chest discomfort overnight with appropriate medication management.  Small elevation of troponin although steady throughout the entire day into today at peak of 0.3 and steady at 0.24 and unchanged. Stress test showing normal myocardial perfusion although the patient had chest pain and significant 2 mm ST depression with severe hypertension resolved with nitroglycerin x3  Review of Systems: Positive for: Shortness of breath chest pain Negative for: Vision change, hearing change, syncope, dizziness, nausea, vomiting,diarrhea, bloody stool, stomach pain, cough, congestion, diaphoresis, urinary frequency, urinary pain,skin lesions, skin rashes Others previously listed  Objective: Telemetry: Normal sinus rhythm Physical Exam: Blood pressure (!) 155/135, pulse 94, temperature 98.4 F (36.9 C), temperature source Oral, resp. rate 19, height 5\' 1"  (1.549 m), weight 93.6 kg, SpO2 99 %. Body mass index is 38.98 kg/m. General: Well developed, well nourished, in no acute distress. Head: Normocephalic, atraumatic, sclera non-icteric, no xanthomas, nares are without discharge. Neck: No apparent masses Lungs: Normal respirations with no wheezes, no rhonchi, no rales , no crackles   Heart: Regular rate and rhythm, normal S1 S2, no murmur, no rub, no gallop, PMI is normal size and placement, carotid upstroke normal without bruit, jugular venous pressure normal Abdomen: Soft, non-tender, non-distended with normoactive bowel sounds. No hepatosplenomegaly. Abdominal aorta is normal size without bruit Extremities: No edema, no clubbing, no cyanosis, no ulcers,  Peripheral: 2+ radial, 2+ femoral, 2+ dorsal pedal pulses Neuro: Alert and oriented. Moves all extremities spontaneously. Psych:  Responds to questions appropriately with  a normal affect.   Intake/Output Summary (Last 24 hours) at 05/21/2018 1338 Last data filed at 05/21/2018 0732 Gross per 24 hour  Intake 240 ml  Output 2250 ml  Net -2010 ml    Inpatient Medications:  . aspirin EC  81 mg Oral Daily  . atorvastatin  10 mg Oral q1800  . calcium-vitamin D  1 tablet Oral Daily  . carvedilol  3.125 mg Oral BID WC  . clopidogrel  75 mg Oral Daily  . conjugated estrogens  1 Applicatorful Vaginal Q M,W,F-2000  . enoxaparin (LOVENOX) injection  95 mg Subcutaneous Q12H  . fesoterodine  8 mg Oral Daily  . hydrochlorothiazide  12.5 mg Oral Daily  . insulin aspart  0-9 Units Subcutaneous TID WC  . insulin glargine  12 Units Subcutaneous Daily  . losartan  100 mg Oral Daily  . meloxicam  15 mg Oral Daily  . mirabegron ER  25 mg Oral Daily  . mometasone-formoterol  2 puff Inhalation BID  . multivitamin-lutein  1 capsule Oral BID  . nitroGLYCERIN  1 inch Topical Q6H  . pantoprazole  40 mg Oral Daily  . polyvinyl alcohol  1-2 drop Both Eyes QHS  . pregabalin  75 mg Oral BID  . sodium chloride flush  3 mL Intravenous Q12H  . verapamil  120 mg Oral Daily   And  . verapamil  180 mg Oral Daily  . vitamin B-12  100 mcg Oral Daily   Infusions:  . sodium chloride      Labs: Recent Labs    05/20/18 1220 05/21/18 0457  NA 135  --   K 4.0  --   CL 96*  --   CO2 25  --   GLUCOSE 264*  --   BUN 15  --   CREATININE 1.10* 0.90  CALCIUM  9.1  --    No results for input(s): AST, ALT, ALKPHOS, BILITOT, PROT, ALBUMIN in the last 72 hours. Recent Labs    05/20/18 1220 05/21/18 0457  WBC 7.2 5.5  HGB 13.3 12.1  HCT 41.6 38.3  MCV 80.5 81.7  PLT 240 224   Recent Labs    05/20/18 1220 05/20/18 1812 05/20/18 2339 05/21/18 0457  TROPONINI 0.30* 0.25* 0.22* 0.24*   Invalid input(s): POCBNP No results for input(s): HGBA1C in the last 72 hours.   Weights: Filed Weights   05/20/18 1217 05/21/18 0305  Weight: 94.3 kg 93.6 kg     Radiology/Studies:   Dg Chest 2 View  Result Date: 05/20/2018 CLINICAL DATA:  Chest pain/burning at night for several days. EXAM: CHEST - 2 VIEW COMPARISON:  Chest x-ray dated 04/14/2016. FINDINGS: Heart size and mediastinal contours are within normal limits. Loop recorder projecting over the LEFT heart border. Atherosclerotic changes noted at the aortic arch. Lungs are clear. No pleural effusion or pneumothorax seen. No acute or suspicious osseous finding. IMPRESSION: 1. No active cardiopulmonary disease. No evidence of pneumonia or pulmonary edema. 2. Aortic atherosclerosis. Electronically Signed   By: Franki Cabot M.D.   On: 05/20/2018 13:01     Assessment and Recommendation  82 y.o. female 82 year old female with essential hypertension mixed hyperlipidemia diabetes and unstable angina with elevated troponin of possible ischemic causes with abnormal stress test EKG with ST depression chest pain and hypertension blood perfusion normal concerning for true cardiovascular cause of chest pain 1.  Continue medication management for hypertension control high intensity cholesterol therapy and diabetes 2.  Continue Lovenox for risk reduction in myocardial infarction 3.  Proceed to cardiac catheterization to assess coronary anatomy and further treatment thereof is necessary.  Patient understands risk and benefits of cardiac catheterization.  This includes a possibility of death stroke heart attack infection bleeding blood clot.  She is at low risk for conscious sedation Plan for cardiac catheterization tomorrow due to tight schedule  Signed, Serafina Royals M.D. FACC

## 2018-05-21 NOTE — Progress Notes (Signed)
Notified MD of blood pressure. Orders placed. Will continue to monitor and assess.  

## 2018-05-21 NOTE — Progress Notes (Signed)
Notified MD of pt taking her Lantus at home in the am instead of pm. Orders placed. Will continue to monitor and assess.

## 2018-05-21 NOTE — Care Management Obs Status (Signed)
[place] NOTIFICATION   Patient Details  Name: Brandi [name] MRN: [id number] Date of Birth: 04-21-1936   Medicare Observation Status Notification Given:  Yes    Elza Rafter, RN 05/21/2018, 2:55 PM

## 2018-05-21 NOTE — Care Management Note (Signed)
Case Management Note  Patient Details  Name: Brandi [name] MRN: [id number] Date of Birth: 26-Aug-1935  Subjective/Objective:       Patient is from home and lives with son.  She has been placed in observation for chest pain.  Cardiac catheterization is scheduled for 12-4.  Current with PCP.  Denies difficulties obtaining medications or with medical care.  Uses a cane and a walker at home as needed.  No current needs identified at this time by [facility].               Action/Plan:   Expected Discharge Date:                  Expected Discharge Plan:  Home/Self Care  In-House Referral:     Discharge planning Services  CM Consult  Post Acute Care Choice:    Choice offered to:     DME Arranged:    DME Agency:     HH Arranged:    HH Agency:     Status of Service:  Completed, signed off  If discussed at [employer] of Stay Meetings, dates discussed:    Additional Comments:  Elza Rafter, RN 05/21/2018, 2:56 PM

## 2018-05-21 NOTE — Progress Notes (Addendum)
Inpatient Diabetes Program Recommendations  AACE/ADA: New Consensus Statement on Inpatient Glycemic Control (2019)  Target Ranges:  Prepandial:   less than 140 mg/dL      Peak postprandial:   less than 180 mg/dL (1-2 hours)      Critically ill patients:  140 - 180 mg/dL  Results for Brandi [name], Brandi [name] (MRN [id number]) as of 05/21/2018 10:20  Ref. Range 05/20/2018 17:50 05/20/2018 20:54 05/21/2018 07:33  Glucose-Capillary Latest Ref Range: 70 - 99 mg/dL 100 (H) 175 (H) 140 (H)    Review of Glycemic Control  Diabetes history: DM2 Outpatient Diabetes medications: Lantus 12 units QAM, Glipizide 5 mg BID, Janumet XR 50-1000 mg BID Current orders for Inpatient glycemic control: Latnus 12 units daily, Novolog 0-9 units TID with meals, Glipizide 5 mg BID, Tradjenta 5 mg daily, Metformin 500 mg BID  Inpatient Diabetes Program Recommendations:  Insulin - Basal: No Lantus given since admitted and fasting glucose 140 mg/dl today. Please consider decreasing Lantus to 9 units daily (based on 93 kg x 0.1 units). Oral Agents: Patient is currently NPO, please discontinue all oral DM medications while inpatient.  Thanks, Barnie Alderman, RN, MSN, CDE Diabetes Coordinator Inpatient Diabetes Program [phone] (Team Pager from 8am to 5pm)

## 2018-05-21 NOTE — Progress Notes (Signed)
Received call from IV team nurse stating that patient is declining to have a second PIV inserted for cardiac catheterization until the morning (unsure of exact time). Will pass along in shift report. Wenda Low [facility]

## 2018-05-21 NOTE — Plan of Care (Signed)
  Problem: Health Behavior/Discharge Planning: Goal: Ability to manage health-related needs will improve Outcome: Progressing Note:  Patient for cardiac catheterization in the morning. Consent has been obtained. Patient for second IV later in shift. Will continue to monitor. Wenda Low [facility]

## 2018-05-21 NOTE — Progress Notes (Signed)
[facility] [place] NAME: Brandi [name]    MR#:  [id number]  DATE OF BIRTH:  1936/03/17  SUBJECTIVE:  CHIEF COMPLAINT:   Chief Complaint  Patient presents with  . Chest Pain  no symptoms, husband at bedside REVIEW OF SYSTEMS:  Review of Systems  Constitutional: Negative for diaphoresis, fever, malaise/fatigue and weight loss.  HENT: Negative for ear discharge, ear pain, hearing loss, nosebleeds, sore throat and tinnitus.   Eyes: Negative for blurred vision and pain.  Respiratory: Negative for cough, hemoptysis, shortness of breath and wheezing.   Cardiovascular: Negative for chest pain, palpitations, orthopnea and leg swelling.  Gastrointestinal: Negative for abdominal pain, blood in stool, constipation, diarrhea, heartburn, nausea and vomiting.  Genitourinary: Negative for dysuria, frequency and urgency.  Musculoskeletal: Negative for back pain and myalgias.  Skin: Negative for itching and rash.  Neurological: Negative for dizziness, tingling, tremors, focal weakness, seizures, weakness and headaches.  Psychiatric/Behavioral: Negative for depression. The patient is not nervous/anxious.     DRUG ALLERGIES:   Allergies  Allergen Reactions  . Micardis [Telmisartan] Other (See Comments)    Dizziness  . Accupril [Quinapril Hcl] Rash  . Quinapril Rash   VITALS:  Blood pressure (!) 155/135, pulse 94, temperature 98.4 F (36.9 C), temperature source Oral, resp. rate 19, height 5\' 1"  (1.549 m), weight 93.6 kg, SpO2 99 %. PHYSICAL EXAMINATION:  Physical Exam  Constitutional: She is oriented to person, place, and time.  HENT:  Head: Normocephalic and atraumatic.  Eyes: Pupils are equal, round, and reactive to light. Conjunctivae and EOM are normal.  Neck: Normal range of motion. Neck supple. No tracheal deviation present. No thyromegaly present.  Cardiovascular: Normal rate, regular rhythm and normal heart sounds.  Pulmonary/Chest:  Effort normal and breath sounds normal. No respiratory distress. She has no wheezes. She exhibits no tenderness.  Abdominal: Soft. Bowel sounds are normal. She exhibits no distension. There is no tenderness.  Musculoskeletal: Normal range of motion.  Neurological: She is alert and oriented to person, place, and time. No cranial nerve deficit.  Skin: Skin is warm and dry. No rash noted.   LABORATORY PANEL:  Female CBC Recent Labs  Lab 05/21/18 0457  WBC 5.5  HGB 12.1  HCT 38.3  PLT 224   ------------------------------------------------------------------------------------------------------------------ Chemistries  Recent Labs  Lab 05/20/18 1220 05/21/18 0457  NA 135  --   K 4.0  --   CL 96*  --   CO2 25  --   GLUCOSE 264*  --   BUN 15  --   CREATININE 1.10* 0.90  CALCIUM 9.1  --    RADIOLOGY:  Nm Myocar Multi W/spect W/wall Motion / Ef  Result Date: 05/21/2018  Findings consistent with ischemia.  This is a high risk study.  The left ventricular ejection fraction is normal (55-65%).  Blood pressure demonstrated a hypertensive response to exercise.  Horizontal ST segment depression ST segment depression was noted during stress in the II, III and aVF leads, beginning at 8 minutes of stress, ending at 1 minutes of stress, and returning to baseline after 5-9 minutes of recovery.  T wave inversion was noted during stress.    ASSESSMENT AND PLAN:  Patient is 82 year old presenting with chest pain  1.  Unstable Angina with history of coronary artery disease - Per cardio - abnormal stress test, EKG with ST depression. concerning for true cardiovascular cause of chest pain Continue aspirin, Plavix - cath in morning  2.  Hypertension - continue Coreg, HCTZ, Losartan, Verapamil.  3.  Diabetes type 2 Place patient on sliding scale insulin continue her oral medications as well as Lantus (cut back to 9 units)  4.  Sleep apnea CPAP machine has been ordered  5.  GERD  continue Protonix  6.  Hyperlipidemia continue Zocor     All the records are reviewed and case discussed with Care Management/Social Worker. Management plans discussed with the patient, family (husband at bedside) and they are in agreement.  CODE STATUS: Full Code  TOTAL TIME TAKING CARE OF THIS PATIENT: 35 minutes.   More than 50% of the time was spent in counseling/coordination of care: YES  POSSIBLE D/C IN 1 DAYS, DEPENDING ON CLINICAL CONDITION.   Max Sane M.D on 05/21/2018 at 1:57 PM  Between 7am to 6pm - Pager - [phone]  After 6pm go to [email or web address] - Proofreader  [facility]  Office  [phone]  CC: Primary care physician; Baxter Hire, MD  Note: This dictation was prepared with Dragon dictation along with smaller phrase technology. Any transcriptional errors that result from this process are unintentional.

## 2018-05-21 NOTE — Progress Notes (Signed)
PHARMACIST - PHYSICIAN COMMUNICATION  CONCERNING: Simvastatin 20 mg daily and verapamil - rhabdomyolysis risk    RECOMMENDATION: Patients on verapamil and simvastatin >10 mg/day have reported cases of rhabdomyolysis. Pharmacy is to assess simvastatin dose. If >10 mg, substitute atorvastatin (Lipitor) 1mg  for each 2mg  simvastatin  DESCRIPTION: Pharmacy has discontinued simvastatin 20mg  and ordered atorvastatin 10mg  daily per protocol.   Pernell Dupre, PharmD, BCPS Clinical Pharmacist 05/21/2018 12:24 PM

## 2018-05-21 NOTE — Plan of Care (Signed)
  Problem: Clinical Measurements: Goal: Cardiovascular complication will be avoided Outcome: Progressing   Problem: Cardiac: Goal: Ability to achieve and maintain adequate cardiovascular perfusion will improve Outcome: Progressing

## 2018-05-22 ENCOUNTER — Encounter
Admission: EM | Disposition: A | Discharge status: Other Acute Inpatient Hospital | Payer: Self-pay | Source: Home / Self Care | Attending: Internal Medicine

## 2018-05-22 ENCOUNTER — Encounter: Payer: Self-pay | Admitting: Internal Medicine

## 2018-05-22 HISTORY — PX: LEFT HEART CATH AND CORONARY ANGIOGRAPHY: CATH118249

## 2018-05-22 LAB — BASIC METABOLIC PANEL
Anion gap: 12 (ref 5–15)
BUN: 12 mg/dL (ref 8–23)
CO2: 26 mmol/L (ref 22–32)
Calcium: 9.2 mg/dL (ref 8.9–10.3)
Chloride: 99 mmol/L (ref 98–111)
Creatinine, Ser: 0.79 mg/dL (ref 0.44–1.00)
GFR calc Af Amer: >60 mL/min (ref >60)
GFR calc non Af Amer: >60 mL/min (ref >60)
Glucose, Bld: 201 mg/dL — ABNORMAL HIGH (ref 70–99)
Potassium: 3.5 mmol/L (ref 3.5–5.1)
Sodium: 137 mmol/L (ref 135–145)

## 2018-05-22 LAB — CBC
HCT: 38.3 % (ref 36.0–46.0)
Hemoglobin: 12.4 g/dL (ref 12.0–15.0)
MCH: 25.8 pg — ABNORMAL LOW (ref 26.0–34.0)
MCHC: 32.4 g/dL (ref 30.0–36.0)
MCV: 79.8 fL — ABNORMAL LOW (ref 80.0–100.0)
Platelets: 227 10*3/uL (ref 150–400)
RBC: 4.8 MIL/uL (ref 3.87–5.11)
RDW: 14.1 % (ref 11.5–15.5)
WBC: 6.6 10*3/uL (ref 4.0–10.5)
nRBC: 0 % (ref 0.0–0.2)

## 2018-05-22 LAB — GLUCOSE, CAPILLARY
GLUCOSE-CAPILLARY: 256 mg/dL — AB (ref 70–99)
Glucose-Capillary: 178 mg/dL — ABNORMAL HIGH (ref 70–99)
Glucose-Capillary: 191 mg/dL — ABNORMAL HIGH (ref 70–99)
Glucose-Capillary: 239 mg/dL — ABNORMAL HIGH (ref 70–99)
Glucose-Capillary: 193 mg/dL — ABNORMAL HIGH (ref 70–99)

## 2018-05-22 SURGERY — LEFT HEART CATH AND CORONARY ANGIOGRAPHY
Anesthesia: Moderate Sedation

## 2018-05-22 MED ORDER — FENTANYL CITRATE (PF) 100 MCG/2ML IJ SOLN
INTRAMUSCULAR | Status: AC
  Filled 2018-05-22: qty 2

## 2018-05-22 MED ORDER — FENTANYL CITRATE (PF) 100 MCG/2ML IJ SOLN
INTRAMUSCULAR | Status: DC | PRN
  Administered 2018-05-22: 25 ug via INTRAVENOUS

## 2018-05-22 MED ORDER — LABETALOL HCL 5 MG/ML IV SOLN
INTRAVENOUS | Status: DC | PRN
  Administered 2018-05-22: 20 mg via INTRAVENOUS

## 2018-05-22 MED ORDER — MIDAZOLAM HCL 2 MG/2ML IJ SOLN
INTRAMUSCULAR | Status: AC
  Filled 2018-05-22: qty 2

## 2018-05-22 MED ORDER — ENOXAPARIN SODIUM 100 MG/ML ~~LOC~~ SOLN
1.0000 mg/kg | Freq: Two times a day (BID) | SUBCUTANEOUS | Status: DC
  Administered 2018-05-22 – 2018-05-24 (×5): 95 mg via SUBCUTANEOUS
  Filled 2018-05-22 (×6): qty 1

## 2018-05-22 MED ORDER — HYDRALAZINE HCL 20 MG/ML IJ SOLN
INTRAMUSCULAR | Status: AC
  Filled 2018-05-22: qty 1

## 2018-05-22 MED ORDER — HEPARIN (PORCINE) IN NACL 1000-0.9 UT/500ML-% IV SOLN
INTRAVENOUS | Status: AC
  Filled 2018-05-22: qty 1000

## 2018-05-22 MED ORDER — MIDAZOLAM HCL 2 MG/2ML IJ SOLN
INTRAMUSCULAR | Status: DC | PRN
  Administered 2018-05-22: 1 mg via INTRAVENOUS

## 2018-05-22 MED ORDER — LABETALOL HCL 5 MG/ML IV SOLN
INTRAVENOUS | Status: AC
  Filled 2018-05-22: qty 4

## 2018-05-22 SURGICAL SUPPLY — 10 items
CATH INFINITI 5FR ANG PIGTAIL (CATHETERS) ×2 IMPLANT
CATH INFINITI 5FR JL4 (CATHETERS) ×2 IMPLANT
CATH INFINITI JR4 5F (CATHETERS) ×2 IMPLANT
DEVICE CLOSURE MYNXGRIP 5F (Vascular Products) ×2 IMPLANT
KIT MANI 3VAL PERCEP (MISCELLANEOUS) ×3 IMPLANT
NDL PERC 18GX7CM (NEEDLE) IMPLANT
NEEDLE PERC 18GX7CM (NEEDLE) ×3 IMPLANT
PACK CARDIAC CATH (CUSTOM PROCEDURE TRAY) ×3 IMPLANT
SHEATH AVANTI 5FR X 11CM (SHEATH) ×2 IMPLANT
WIRE GUIDERIGHT .035X150 (WIRE) ×2 IMPLANT

## 2018-05-22 NOTE — Progress Notes (Signed)
Patient resting comfortably in bed with family at bedside.  Patient with no c/o chest pain.  R femoral site s/p cardiac cath non-tender, without noted hematoma or active bleeding. Dressing D/I. +CMST.  Plan is for patient to be transferred to [facility] when bed comes available.  Call bell in reach.

## 2018-05-22 NOTE — Progress Notes (Signed)
Patient off floor to cath lab prior to report to this RN from night shift.

## 2018-05-22 NOTE — Progress Notes (Signed)
[facility] [place] NAME: Brandi [name]    MR#:  [id number]  DATE OF BIRTH:  [birth year]  SUBJECTIVE:  CHIEF COMPLAINT:   Chief Complaint  Patient presents with  . Chest Pain  teary eyes as she was hoping she would be ok with stent and now needing CABG. No complaints REVIEW OF SYSTEMS:  Review of Systems  Constitutional: Negative for diaphoresis, fever, malaise/fatigue and weight loss.  HENT: Negative for ear discharge, ear pain, hearing loss, nosebleeds, sore throat and tinnitus.   Eyes: Negative for blurred vision and pain.  Respiratory: Negative for cough, hemoptysis, shortness of breath and wheezing.   Cardiovascular: Negative for chest pain, palpitations, orthopnea and leg swelling.  Gastrointestinal: Negative for abdominal pain, blood in stool, constipation, diarrhea, heartburn, nausea and vomiting.  Genitourinary: Negative for dysuria, frequency and urgency.  Musculoskeletal: Negative for back pain and myalgias.  Skin: Negative for itching and rash.  Neurological: Negative for dizziness, tingling, tremors, focal weakness, seizures, weakness and headaches.  Psychiatric/Behavioral: Negative for depression. The patient is not nervous/anxious.     DRUG ALLERGIES:   Allergies  Allergen Reactions  . Micardis [Telmisartan] Other (See Comments)    Dizziness  . Accupril [Quinapril Hcl] Rash  . Quinapril Rash   VITALS:  Blood pressure (!) 153/65, pulse 81, temperature 98.3 F (36.8 C), temperature source Oral, resp. rate (!) 22, height 5\' 1"  (1.549 m), weight 93 kg, SpO2 93 %. PHYSICAL EXAMINATION:  Physical Exam  Constitutional: She is oriented to person, place, and time.  HENT:  Head: Normocephalic and atraumatic.  Eyes: Pupils are equal, round, and reactive to light. Conjunctivae and EOM are normal.  Neck: Normal range of motion. Neck supple. No tracheal deviation present. No thyromegaly present.  Cardiovascular: Normal rate,  regular rhythm and normal heart sounds.  Pulmonary/Chest: Effort normal and breath sounds normal. No respiratory distress. She has no wheezes. She exhibits no tenderness.  Abdominal: Soft. Bowel sounds are normal. She exhibits no distension. There is no tenderness.  Musculoskeletal: Normal range of motion.  Neurological: She is alert and oriented to person, place, and time. No cranial nerve deficit.  Skin: Skin is warm and dry. No rash noted.   LABORATORY PANEL:  Female CBC Recent Labs  Lab 05/22/18 0347  WBC 6.6  HGB 12.4  HCT 38.3  PLT 227   ------------------------------------------------------------------------------------------------------------------ Chemistries  Recent Labs  Lab 05/22/18 0347  NA 137  K 3.5  CL 99  CO2 26  GLUCOSE 201*  BUN 12  CREATININE 0.79  CALCIUM 9.2   RADIOLOGY:  No results found. ASSESSMENT AND PLAN:  Patient is 82 year old presenting with chest pain  1.  Unstable Angina with history of coronary artery disease - Per cardio - abnormal stress test, EKG with ST depression. concerning for true cardiovascular cause of chest pain Continue aspirin, Plavix - Cardiac catheterization showing normal LV systolic function with ejection fraction of 55 to 60% Significant restenosis of proximal LAD of 90% with some distal 50% stenosis at first diagonal, 80% circumflex and 90% proximal right coronary artery - recommend CABG and waiting for transfer to [facility] per Dr Nehemiah Massed  2.  Hypertension - continue Coreg, HCTZ, Losartan, Verapamil.  3.  Diabetes type 2 Place patient on sliding scale insulin continue her oral medications as well as Lantus (cut back to 9 units)  4.  Sleep apnea CPAP machine has been ordered  5.  GERD continue Protonix  6.  Hyperlipidemia continue  Zocor     All the records are reviewed and case discussed with Care Management/Social Worker. Management plans discussed with the patient, family (husband at bedside) and they are  in agreement.  CODE STATUS: Full Code  TOTAL TIME TAKING CARE OF THIS PATIENT: 35 minutes.   More than 50% of the time was spent in counseling/coordination of care: YES  POSSIBLE D/C IN 1 DAYS, DEPENDING ON CLINICAL CONDITION.   Max Sane M.D on 05/22/2018 at 5:10 PM  Between 7am to 6pm - Pager - [phone]  After 6pm go to [email or web address] - Proofreader  [facility]  Office  [phone]  CC: Primary care physician; Baxter Hire, MD  Note: This dictation was prepared with Dragon dictation along with smaller phrase technology. Any transcriptional errors that result from this process are unintentional.

## 2018-05-22 NOTE — Progress Notes (Signed)
[facility] Encounter Note  Patient: Brandi [name] / Admit Date: 05/20/2018 / Date of Encounter: 05/22/2018, 8:11 AM   Subjective: Patient had full resolution of chest discomfort overnight with appropriate medication management.  Small elevation of troponin although steady throughout the entire day into today at peak of 0.3 and steady at 0.24 and unchanged. Stress test showing normal myocardial perfusion although the patient had chest pain and significant 2 mm ST depression with severe hypertension resolved with nitroglycerin x3  Cardiac catheterization showing normal LV systolic function with ejection fraction of 55 to 60% Significant restenosis of proximal LAD of 90% with some distal 50% stenosis at first diagonal, 80% circumflex and 90% proximal right coronary artery  Review of Systems: Positive for: Shortness of breath chest pain Negative for: Vision change, hearing change, syncope, dizziness, nausea, vomiting,diarrhea, bloody stool, stomach pain, cough, congestion, diaphoresis, urinary frequency, urinary pain,skin lesions, skin rashes Others previously listed  Objective: Telemetry: Normal sinus rhythm Physical Exam: Blood pressure (!) 228/96, pulse 84, temperature 98.4 F (36.9 C), temperature source Oral, resp. rate 12, height 5\' 1"  (1.549 m), weight 93 kg, SpO2 96 %. Body mass index is 38.73 kg/m. General: Well developed, well nourished, in no acute distress. Head: Normocephalic, atraumatic, sclera non-icteric, no xanthomas, nares are without discharge. Neck: No apparent masses Lungs: Normal respirations with no wheezes, no rhonchi, no rales , no crackles   Heart: Regular rate and rhythm, normal S1 S2, no murmur, no rub, no gallop, PMI is normal size and placement, carotid upstroke normal without bruit, jugular venous pressure normal Abdomen: Soft, non-tender, non-distended with normoactive bowel sounds. No hepatosplenomegaly. Abdominal aorta is normal size  without bruit Extremities: No edema, no clubbing, no cyanosis, no ulcers,  Peripheral: 2+ radial, 2+ femoral, 2+ dorsal pedal pulses Neuro: Alert and oriented. Moves all extremities spontaneously. Psych:  Responds to questions appropriately with a normal affect.   Intake/Output Summary (Last 24 hours) at 05/22/2018 0811 Last data filed at 05/22/2018 0458 Gross per 24 hour  Intake 243 ml  Output 1250 ml  Net -1007 ml    Inpatient Medications:  . [MAR Hold] aspirin EC  81 mg Oral Daily  . [MAR Hold] atorvastatin  10 mg Oral q1800  . [MAR Hold] calcium-vitamin D  1 tablet Oral Daily  . [MAR Hold] carvedilol  3.125 mg Oral BID WC  . [MAR Hold] clopidogrel  75 mg Oral Daily  . [MAR Hold] conjugated estrogens  1 Applicatorful Vaginal Q M,W,F-2000  . [MAR Hold] fesoterodine  8 mg Oral Daily  . [MAR Hold] hydrochlorothiazide  12.5 mg Oral Daily  . [MAR Hold] insulin aspart  0-9 Units Subcutaneous TID WC  . [MAR Hold] insulin glargine  9 Units Subcutaneous Daily  . [MAR Hold] loratadine  10 mg Oral Daily  . [MAR Hold] losartan  100 mg Oral Daily  . [MAR Hold] meloxicam  15 mg Oral Daily  . [MAR Hold] mirabegron ER  25 mg Oral Daily  . [MAR Hold] mometasone-formoterol  2 puff Inhalation BID  . [MAR Hold] multivitamin-lutein  1 capsule Oral BID  . [MAR Hold] nitroGLYCERIN  1 inch Topical Q6H  . [MAR Hold] pantoprazole  40 mg Oral Daily  . [MAR Hold] polyvinyl alcohol  1-2 drop Both Eyes QHS  . [MAR Hold] pregabalin  75 mg Oral BID  . [MAR Hold] sodium chloride flush  3 mL Intravenous Q12H  . sodium chloride flush  3 mL Intravenous Q12H  . [facility] Hold]  verapamil  120 mg Oral Daily   And  . [MAR Hold] verapamil  180 mg Oral Daily  . [MAR Hold] vitamin B-12  100 mcg Oral Daily   Infusions:  . [MAR Hold] sodium chloride    . sodium chloride    . sodium chloride      Labs: Recent Labs    05/20/18 1220 05/21/18 0457 05/22/18 0347  NA 135  --  137  K 4.0  --  3.5  CL 96*  --  99   CO2 25  --  26  GLUCOSE 264*  --  201*  BUN 15  --  12  CREATININE 1.10* 0.90 0.79  CALCIUM 9.1  --  9.2   No results for input(s): AST, ALT, ALKPHOS, BILITOT, PROT, ALBUMIN in the last 72 hours. Recent Labs    05/21/18 0457 05/22/18 0347  WBC 5.5 6.6  HGB 12.1 12.4  HCT 38.3 38.3  MCV 81.7 79.8*  PLT 224 227   Recent Labs    05/20/18 1220 05/20/18 1812 05/20/18 2339 05/21/18 0457  TROPONINI 0.30* 0.25* 0.22* 0.24*   Invalid input(s): POCBNP No results for input(s): HGBA1C in the last 72 hours.   Weights: Filed Weights   05/20/18 1217 05/21/18 0305 05/22/18 0458  Weight: 94.3 kg 93.6 kg 93 kg     Radiology/Studies:  Dg Chest 2 View  Result Date: 05/20/2018 CLINICAL DATA:  Chest pain/burning at night for several days. EXAM: CHEST - 2 VIEW COMPARISON:  Chest x-ray dated 04/14/2016. FINDINGS: Heart size and mediastinal contours are within normal limits. Loop recorder projecting over the LEFT heart border. Atherosclerotic changes noted at the aortic arch. Lungs are clear. No pleural effusion or pneumothorax seen. No acute or suspicious osseous finding. IMPRESSION: 1. No active cardiopulmonary disease. No evidence of pneumonia or pulmonary edema. 2. Aortic atherosclerosis. Electronically Signed   By: Franki Cabot M.D.   On: 05/20/2018 13:01   Nm Myocar Multi W/spect W/wall Motion / Ef  Result Date: 05/21/2018  Findings consistent with ischemia.  This is a high risk study.  The left ventricular ejection fraction is normal (55-65%).  Blood pressure demonstrated a hypertensive response to exercise.  Horizontal ST segment depression ST segment depression was noted during stress in the II, III and aVF leads, beginning at 8 minutes of stress, ending at 1 minutes of stress, and returning to baseline after 5-9 minutes of recovery.  T wave inversion was noted during stress.      Assessment and Recommendation  82 y.o. female 82 year old female with essential hypertension mixed  hyperlipidemia diabetes and unstable angina with elevated troponin of possible ischemic causes with abnormal stress test EKG with ST depression chest pain and hypertension blood perfusion normal concerning for true cardiovascular cause of chest pain Cardiac catheterization showing normal LV systolic function with critical three-vessel coronary artery disease 1.  Continue medication management for hypertension control high intensity cholesterol therapy and diabetes 2.  Continue Lovenox for risk reduction in myocardial infarction 3.  Further consideration of coronary artery bypass surgery for significant three-vessel coronary artery disease Signed, Serafina Royals M.D. FACC

## 2018-05-22 NOTE — Plan of Care (Signed)
  Problem: Activity: Goal: Risk for activity intolerance will decrease Outcome: Progressing Note:  Up to bathroom with standby assist tolerating well   Problem: Nutrition: Goal: Adequate nutrition will be maintained Outcome: Progressing   Problem: Coping: Goal: Level of anxiety will decrease Outcome: Progressing   Problem: Elimination: Goal: Will not experience complications related to urinary retention Outcome: Progressing   Problem: Pain Managment: Goal: General experience of comfort will improve Outcome: Progressing Note:  No complaints of pain this shift   Problem: Safety: Goal: Ability to remain free from injury will improve Outcome: Progressing   Problem: Activity: Goal: Ability to tolerate increased activity will improve Outcome: Progressing   Problem: Cardiac: Goal: Ability to achieve and maintain adequate cardiovascular perfusion will improve Outcome: Progressing Note:  No chest pain, but BP elevated, PRN hydralazine given once, and nitro paste reapplied   Problem: Education: Goal: Knowledge of General Education information will improve Description Including pain rating scale, medication(s)/side effects and non-pharmacologic comfort measures Outcome: Completed/Met

## 2018-05-22 NOTE — Progress Notes (Signed)
Report given to [facility] in cath lab, pt was on the schedule for 1230, but got bumped to first case. Updated pt, and pt is to update her family. Second IV site placed- 22g left forearm. Consent signed. Conley Simmonds, RN, BSN

## 2018-05-22 NOTE — Progress Notes (Signed)
Patient returned to floor from cath lab via bed s/p R femoral heart cath approach.  Site soft and without hematoma or active bleeding.  Dressing D/I. Patient with +CMST of R extremity.  Ambualted to bathroom without difficulty. Call bell in reach.

## 2018-05-23 LAB — GLUCOSE, CAPILLARY
Glucose-Capillary: 182 mg/dL — ABNORMAL HIGH (ref 70–99)
Glucose-Capillary: 228 mg/dL — ABNORMAL HIGH (ref 70–99)
Glucose-Capillary: 201 mg/dL — ABNORMAL HIGH (ref 70–99)
Glucose-Capillary: 178 mg/dL — ABNORMAL HIGH (ref 70–99)

## 2018-05-23 LAB — BASIC METABOLIC PANEL
Anion gap: 10 (ref 5–15)
BUN: 16 mg/dL (ref 8–23)
CO2: 26 mmol/L (ref 22–32)
Calcium: 8.8 mg/dL — ABNORMAL LOW (ref 8.9–10.3)
Chloride: 99 mmol/L (ref 98–111)
Creatinine, Ser: 1.01 mg/dL — ABNORMAL HIGH (ref 0.44–1.00)
GFR calc Af Amer: >60 mL/min (ref >60)
GFR calc non Af Amer: 53 mL/min — ABNORMAL LOW (ref >60)
Glucose, Bld: 201 mg/dL — ABNORMAL HIGH (ref 70–99)
POTASSIUM: 3.3 mmol/L — AB (ref 3.5–5.1)
Sodium: 135 mmol/L (ref 135–145)

## 2018-05-23 LAB — CBC
HCT: 36.2 % (ref 36.0–46.0)
Hemoglobin: 11.6 g/dL — ABNORMAL LOW (ref 12.0–15.0)
MCH: 25.7 pg — AB (ref 26.0–34.0)
MCHC: 32 g/dL (ref 30.0–36.0)
MCV: 80.1 fL (ref 80.0–100.0)
PLATELETS: 213 10*3/uL (ref 150–400)
RBC: 4.52 MIL/uL (ref 3.87–5.11)
RDW: 14.4 % (ref 11.5–15.5)
WBC: 6 10*3/uL (ref 4.0–10.5)
nRBC: 0 % (ref 0.0–0.2)

## 2018-05-23 MED ORDER — POTASSIUM CHLORIDE CRYS ER 20 MEQ PO TBCR
40.0000 meq | EXTENDED_RELEASE_TABLET | Freq: Two times a day (BID) | ORAL | Status: AC
  Administered 2018-05-23 (×2): 40 meq via ORAL
  Filled 2018-05-23 (×2): qty 2

## 2018-05-23 NOTE — Plan of Care (Signed)
  Problem: Health Behavior/Discharge Planning: Goal: Ability to manage health-related needs will improve Outcome: Progressing   Problem: Clinical Measurements: Goal: Cardiovascular complication will be avoided Outcome: Progressing   Problem: Activity: Goal: Risk for activity intolerance will decrease Outcome: Progressing   Problem: Pain Managment: Goal: General experience of comfort will improve Outcome: Progressing   Problem: Safety: Goal: Ability to remain free from injury will improve Outcome: Progressing   

## 2018-05-23 NOTE — Progress Notes (Signed)
Inpatient Diabetes Program Recommendations  AACE/ADA: New Consensus Statement on Inpatient Glycemic Control (2019)  Target Ranges:  Prepandial:   less than 140 mg/dL      Peak postprandial:   less than 180 mg/dL (1-2 hours)      Critically ill patients:  140 - 180 mg/dL   Results for SHERON, TALLMAN (MRN [id number]) as of 05/23/2018 12:00  Ref. Range 05/22/2018 07:42 05/22/2018 08:33 05/22/2018 11:44 05/22/2018 16:53 05/22/2018 21:02 05/23/2018 07:39 05/23/2018 11:31  Glucose-Capillary Latest Ref Range: 70 - 99 mg/dL 178 (H) 191 (H) 239 (H) 256 (H) 193 (H) 182 (H) 228 (H)   Review of Glycemic Control  Diabetes history: DM2 Outpatient Diabetes medications: Lantus 12 units QAM, Glipizide 5 mg BID, Janumet XR 50-1000 mg BID Current orders for Inpatient glycemic control: Lantus 9 units daily, Novolog 0-9 units TID with meals  Inpatient Diabetes Program Recommendations:  Insulin - Meal Coverage:Post prandial glucose is consistently elevated.  Please consider ordering Novolog 4 units TID with meals for meal coverage if patient eats at least 50% of meals.  Thanks, Barnie Alderman, RN, MSN, CDE Diabetes Coordinator Inpatient Diabetes Program [phone] (Team Pager from 8am to 5pm)

## 2018-05-23 NOTE — Consult Note (Signed)
ANTICOAGULATION CONSULT NOTE - Initial Consult  Pharmacy Consult for Lovenox Indication: chest pain/ACS (note indicates suspected NSTEMI)  Allergies  Allergen Reactions  . Micardis [Telmisartan] Other (See Comments)    Dizziness  . Accupril [Quinapril Hcl] Rash  . Quinapril Rash    Patient Measurements: Height: 5\' 1"  (154.9 cm) Weight: 205 lb (93 kg) IBW/kg (Calculated) : 47.8   Vital Signs: Temp: 98.3 F (36.8 C) (12/05 0733) Temp Source: Oral (12/05 0733) BP: 172/60 (12/05 1151) Pulse Rate: 85 (12/05 0932)  Labs: Recent Labs    05/20/18 1812 05/20/18 2339  05/21/18 0457 05/22/18 0347 05/23/18 0315  HGB  --   --    < > 12.1 12.4 11.6*  HCT  --   --   --  38.3 38.3 36.2  PLT  --   --   --  224 227 213  CREATININE  --   --   --  0.90 0.79 1.01*  TROPONINI 0.25* 0.22*  --  0.24*  --   --    < > = values in this interval not displayed.    Estimated Creatinine Clearance: 46.2 mL/min (A) (by C-G formula based on SCr of 1.01 mg/dL (H)).   Medical History: Past Medical History:  Diagnosis Date  . Anemia   . Anxiety   . Arthritis, degenerative   . Chronic cystitis   . Depression   . Diabetes mellitus without complication ([place])   . GERD (gastroesophageal reflux disease)   . Headache   . Heart disease   . HLD (hyperlipidemia)   . HTN (hypertension)   . Hypertension   . Incontinence in female   . Murmur   . Obesity   . Sleep apnea   . Urge incontinence of urine     Medications:  ASA 81mg , carvedilol 3.125mg , Prasugrel  Assessment: Patient is being treated with Lovenox therapy for NSTEMI  Goal of Therapy:  Monitor platelets by anticoagulation protocol: Yes  Plan:   Lovenox dosed at 1mg /kg q12hr at actual body weight (95mg  q 12) Will monitor CBCs and Scr daily for any dosing adjustments  Forrest Moron, PharmD Clinical Pharmacist 05/23/2018 2:05 PM

## 2018-05-23 NOTE — Progress Notes (Signed)
[facility] Encounter Note  Patient: Brandi [name] / Admit Date: 05/20/2018 / Date of Encounter: 05/23/2018, 9:04 AM   Subjective: Patient had full resolution of chest discomfort overnight with appropriate medication management.  Small elevation of troponin although steady throughout the entire day into today at peak of 0.3 and steady at 0.24 and unchanged. Stress test showing normal myocardial perfusion although the patient had chest pain and significant 2 mm ST depression with severe hypertension resolved with nitroglycerin x3  Cardiac catheterization showing normal LV systolic function with ejection fraction of 55 to 60% Significant restenosis of proximal LAD of 90% with some distal 50% stenosis at first diagonal, 80% circumflex and 90% proximal right coronary artery  Patient continues to be hemodynamically stable at this time without evidence of chest discomfort or shortness of breath  Review of Systems: Positive for: None Negative for: Vision change, hearing change, syncope, dizziness, nausea, vomiting,diarrhea, bloody stool, stomach pain, cough, congestion, diaphoresis, urinary frequency, urinary pain,skin lesions, skin rashes Others previously listed  Objective: Telemetry: Normal sinus rhythm Physical Exam: Blood pressure (!) 176/74, pulse 81, temperature 98.3 F (36.8 C), temperature source Oral, resp. rate 19, height 5\' 1"  (1.549 m), weight 93 kg, SpO2 98 %. Body mass index is 38.73 kg/m. General: Well developed, well nourished, in no acute distress. Head: Normocephalic, atraumatic, sclera non-icteric, no xanthomas, nares are without discharge. Neck: No apparent masses Lungs: Normal respirations with no wheezes, no rhonchi, no rales , no crackles   Heart: Regular rate and rhythm, normal S1 S2, no murmur, no rub, no gallop, PMI is normal size and placement, carotid upstroke normal without bruit, jugular venous pressure normal Abdomen: Soft, non-tender,  non-distended with normoactive bowel sounds. No hepatosplenomegaly. Abdominal aorta is normal size without bruit Extremities: No edema, no clubbing, no cyanosis, no ulcers,  Peripheral: 2+ radial, 2+ femoral, 2+ dorsal pedal pulses Neuro: Alert and oriented. Moves all extremities spontaneously. Psych:  Responds to questions appropriately with a normal affect.   Intake/Output Summary (Last 24 hours) at 05/23/2018 0904 Last data filed at 05/23/2018 0400 Gross per 24 hour  Intake 240 ml  Output 700 ml  Net -460 ml    Inpatient Medications:  . aspirin EC  81 mg Oral Daily  . atorvastatin  10 mg Oral q1800  . calcium-vitamin D  1 tablet Oral Daily  . carvedilol  3.125 mg Oral BID WC  . conjugated estrogens  1 Applicatorful Vaginal Q M,W,F-2000  . enoxaparin (LOVENOX) injection  1 mg/kg Subcutaneous Q12H  . fesoterodine  8 mg Oral Daily  . hydrochlorothiazide  12.5 mg Oral Daily  . insulin aspart  0-9 Units Subcutaneous TID WC  . insulin glargine  9 Units Subcutaneous Daily  . loratadine  10 mg Oral Daily  . losartan  100 mg Oral Daily  . meloxicam  15 mg Oral Daily  . mirabegron ER  25 mg Oral Daily  . mometasone-formoterol  2 puff Inhalation BID  . multivitamin-lutein  1 capsule Oral BID  . nitroGLYCERIN  1 inch Topical Q6H  . pantoprazole  40 mg Oral Daily  . polyvinyl alcohol  1-2 drop Both Eyes QHS  . potassium chloride  40 mEq Oral BID  . pregabalin  75 mg Oral BID  . sodium chloride flush  3 mL Intravenous Q12H  . verapamil  120 mg Oral Daily   And  . verapamil  180 mg Oral Daily  . vitamin B-12  100 mcg Oral Daily  Infusions:  . sodium chloride      Labs: Recent Labs    05/22/18 0347 05/23/18 0315  NA 137 135  K 3.5 3.3*  CL 99 99  CO2 26 26  GLUCOSE 201* 201*  BUN 12 16  CREATININE 0.79 1.01*  CALCIUM 9.2 8.8*   No results for input(s): AST, ALT, ALKPHOS, BILITOT, PROT, ALBUMIN in the last 72 hours. Recent Labs    05/22/18 0347 05/23/18 0315  WBC  6.6 6.0  HGB 12.4 11.6*  HCT 38.3 36.2  MCV 79.8* 80.1  PLT 227 213   Recent Labs    05/20/18 1220 05/20/18 1812 05/20/18 2339 05/21/18 0457  TROPONINI 0.30* 0.25* 0.22* 0.24*   Invalid input(s): POCBNP No results for input(s): HGBA1C in the last 72 hours.   Weights: Filed Weights   05/20/18 1217 05/21/18 0305 05/22/18 0458  Weight: 94.3 kg 93.6 kg 93 kg     Radiology/Studies:  Dg Chest 2 View  Result Date: 05/20/2018 CLINICAL DATA:  Chest pain/burning at night for several days. EXAM: CHEST - 2 VIEW COMPARISON:  Chest x-ray dated 04/14/2016. FINDINGS: Heart size and mediastinal contours are within normal limits. Loop recorder projecting over the LEFT heart border. Atherosclerotic changes noted at the aortic arch. Lungs are clear. No pleural effusion or pneumothorax seen. No acute or suspicious osseous finding. IMPRESSION: 1. No active cardiopulmonary disease. No evidence of pneumonia or pulmonary edema. 2. Aortic atherosclerosis. Electronically Signed   By: Franki Cabot M.D.   On: 05/20/2018 13:01   Nm Myocar Multi W/spect W/wall Motion / Ef  Result Date: 05/21/2018  Findings consistent with ischemia.  This is a high risk study.  The left ventricular ejection fraction is normal (55-65%).  Blood pressure demonstrated a hypertensive response to exercise.  Horizontal ST segment depression ST segment depression was noted during stress in the II, III and aVF leads, beginning at 8 minutes of stress, ending at 1 minutes of stress, and returning to baseline after 5-9 minutes of recovery.  T wave inversion was noted during stress.      Assessment and Recommendation  82 y.o. female 82 year old female with essential hypertension mixed hyperlipidemia diabetes and unstable angina with elevated troponin of possible ischemic causes with abnormal stress test EKG with ST depression chest pain and hypertension blood perfusion normal concerning for true cardiovascular cause of chest  pain Cardiac catheterization showing normal LV systolic function with critical three-vessel coronary artery disease 1.  Continue medication management for hypertension control high intensity cholesterol therapy and diabetes 2.  Continue Lovenox for risk reduction in myocardial infarction 3.  Transfer for coronary artery bypass surgery and further treatment options Signed, Serafina Royals M.D. FACC

## 2018-05-23 NOTE — Progress Notes (Signed)
[facility] at [place] NAME: Brandi [name]    MR#:  [id number]  DATE OF BIRTH:  1935/11/14  SUBJECTIVE:  CHIEF COMPLAINT:   Chief Complaint  Patient presents with  . Chest Pain  no complaints, pressure up REVIEW OF SYSTEMS:  Review of Systems  Constitutional: Negative for diaphoresis, fever, malaise/fatigue and weight loss.  HENT: Negative for ear discharge, ear pain, hearing loss, nosebleeds, sore throat and tinnitus.   Eyes: Negative for blurred vision and pain.  Respiratory: Negative for cough, hemoptysis, shortness of breath and wheezing.   Cardiovascular: Negative for chest pain, palpitations, orthopnea and leg swelling.  Gastrointestinal: Negative for abdominal pain, blood in stool, constipation, diarrhea, heartburn, nausea and vomiting.  Genitourinary: Negative for dysuria, frequency and urgency.  Musculoskeletal: Negative for back pain and myalgias.  Skin: Negative for itching and rash.  Neurological: Negative for dizziness, tingling, tremors, focal weakness, seizures, weakness and headaches.  Psychiatric/Behavioral: Negative for depression. The patient is not nervous/anxious.    DRUG ALLERGIES:   Allergies  Allergen Reactions  . Micardis [Telmisartan] Other (See Comments)    Dizziness  . Accupril [Quinapril Hcl] Rash  . Quinapril Rash   VITALS:  Blood pressure (!) 172/60, pulse 85, temperature 98.3 F (36.8 C), temperature source Oral, resp. rate 19, height 5\' 1"  (1.549 m), weight 93 kg, SpO2 98 %. PHYSICAL EXAMINATION:  Physical Exam  Constitutional: She is oriented to person, place, and time.  HENT:  Head: Normocephalic and atraumatic.  Eyes: Pupils are equal, round, and reactive to light. Conjunctivae and EOM are normal.  Neck: Normal range of motion. Neck supple. No tracheal deviation present. No thyromegaly present.  Cardiovascular: Normal rate, regular rhythm and normal heart sounds.  Pulmonary/Chest: Effort normal  and breath sounds normal. No respiratory distress. She has no wheezes. She exhibits no tenderness.  Abdominal: Soft. Bowel sounds are normal. She exhibits no distension. There is no tenderness.  Musculoskeletal: Normal range of motion.  Neurological: She is alert and oriented to person, place, and time. No cranial nerve deficit.  Skin: Skin is warm and dry. No rash noted.   LABORATORY PANEL:  Female CBC Recent Labs  Lab 05/23/18 0315  WBC 6.0  HGB 11.6*  HCT 36.2  PLT 213   ------------------------------------------------------------------------------------------------------------------ Chemistries  Recent Labs  Lab 05/23/18 0315  NA 135  K 3.3*  CL 99  CO2 26  GLUCOSE 201*  BUN 16  CREATININE 1.01*  CALCIUM 8.8*   RADIOLOGY:  No results found. ASSESSMENT AND PLAN:  Patient is 82 year old presenting with chest pain  1.  Unstable Angina with history of coronary artery disease - Per cardio - abnormal stress test - Continue aspirin, Plavix - Cardiac catheterization showing normal LV systolic function with ejection fraction of 55 to 60%. Significant restenosis of proximal LAD of 90% with some distal 50% stenosis at first diagonal, 80% circumflex and 90% proximal right coronary artery -Cardiology recommends CABG and waiting for transfer to [facility] per Dr Nehemiah Massed  2.  Hypertension - continue Coreg, HCTZ, Losartan, Verapamil.  3.  Diabetes type 2 - sliding scale insulin as well as Lantus 9 units  4.  Sleep apnea CPAP as need  5.  GERD continue Protonix  6.  Hyperlipidemia continue Zocor    waiting for bed at [facility] records are reviewed and case discussed with Care Management/Social Worker. Management plans discussed with the patient, family (husband at bedside), Dr. Nehemiah Massed and they are in  agreement.  CODE STATUS: Full Code  TOTAL TIME TAKING CARE OF THIS PATIENT: 35 minutes.   More than 50% of the time was spent in counseling/coordination of care:  YES  POSSIBLE D/C IN 1 DAYS, DEPENDING ON CLINICAL CONDITION.   Max Sane M.D on 05/23/2018 at 12:41 PM  Between 7am to 6pm - Pager - [phone]  After 6pm go to [email or web address] - Proofreader  [facility]  Office  [phone]  CC: Primary care physician; Baxter Hire, MD  Note: This dictation was prepared with Dragon dictation along with smaller phrase technology. Any transcriptional errors that result from this process are unintentional.

## 2018-05-23 NOTE — Discharge Summary (Addendum)
[place] at [place] NAME: Brandi [name]    MR#:  [id number]  DATE OF BIRTH:  11/21/1935  DATE OF ADMISSION:  05/20/2018   ADMITTING PHYSICIAN: Dustin Flock, MD  DATE OF DISCHARGE: 05/24/2018   PRIMARY CARE PHYSICIAN: Baxter Hire, MD   ADMISSION DIAGNOSIS:  Precordial pain [R07.2] NSTEMI (non-ST elevated myocardial infarction) ([place]) [I21.4] DISCHARGE DIAGNOSIS:  Active Problems:   Chest pain   Unstable angina ([place])  SECONDARY DIAGNOSIS:   Past Medical History:  Diagnosis Date  . Anemia   . Anxiety   . Arthritis, degenerative   . Chronic cystitis   . Depression   . Diabetes mellitus without complication ([place])   . GERD (gastroesophageal reflux disease)   . Headache   . Heart disease   . HLD (hyperlipidemia)   . HTN (hypertension)   . Hypertension   . Incontinence in female   . Murmur   . Obesity   . Sleep apnea   . Urge incontinence of urine    HOSPITAL COURSE:  Patient is 82 year old presenting with chest pain  1. Unstable Angina with history of coronary artery disease - Abnormal stress test - Continue aspirin, Plavix - Cardiac catheterization showing normal LV systolic function with ejection fraction of 55 to 60%. Significant restenosis of proximal LAD of 90% with some distal 50% stenosis at first diagonal, 80% circumflex and 90% proximal right coronary artery -Cardiology recommends CABG and waiting for transfer to [facility] per Dr Nehemiah Massed  2.Hypertension - continue Coreg, HCTZ, Losartan, Verapamil.  3.Diabetes type 2 - sliding scale insulin as well as Lantus 9 units  4.Sleep apnea CPAP as need  5.GERD continue Protonix  6.Hyperlipidemia continue Zocor DISCHARGE CONDITIONS:  stable CONSULTS OBTAINED:  Treatment Team:  Corey Skains, MD DRUG ALLERGIES:   Allergies  Allergen Reactions  . Micardis [Telmisartan] Other (See Comments)    Dizziness  . Accupril [Quinapril Hcl] Rash  .  Quinapril Rash   DISCHARGE MEDICATIONS:   Allergies as of 05/24/2018      Reactions   Micardis [telmisartan] Other (See Comments)   Dizziness   Accupril [quinapril Hcl] Rash   Quinapril Rash      Medication List    STOP taking these medications   fesoterodine 8 MG Tb24 tablet Commonly known as:  TOVIAZ     TAKE these medications   aspirin EC 81 MG tablet Take 81 mg by mouth daily.   budesonide-formoterol 80-4.5 MCG/ACT inhaler Commonly known as:  SYMBICORT Inhale 2 puffs into the lungs 2 (two) times daily.   CALCIUM + D3 PO Take 1 tablet by mouth daily.   carvedilol 3.125 MG tablet Commonly known as:  COREG Take 3.125 mg by mouth 2 (two) times daily with a meal.   clopidogrel 75 MG tablet Commonly known as:  PLAVIX Take 75 mg by mouth daily.   conjugated estrogens vaginal cream Commonly known as:  PREMARIN Place 1 Applicatorful vaginally daily. Apply 0.5mg  (pea-sized amount)  just inside the vaginal introitus with a finger-tip every night for two weeks and then Monday, Wednesday and Friday nights.   glipiZIDE 5 MG tablet Commonly known as:  GLUCOTROL Take 5 mg by mouth 2 (two) times daily.   hydrochlorothiazide 12.5 MG tablet Commonly known as:  HYDRODIURIL   JANUMET XR 50-1000 MG Tb24 Generic drug:  SitaGLIPtin-MetFORMIN HCl Take 1 tablet by mouth 2 (two) times daily.   LANTUS SOLOSTAR 100 UNIT/ML Solostar Pen Generic drug:  Insulin  Glargine Inject 12 Units into the skin at bedtime.   losartan 100 MG tablet Commonly known as:  COZAAR Take 100 mg by mouth daily.   meloxicam 15 MG tablet Commonly known as:  MOBIC Take 15 mg by mouth daily.   mirabegron ER 25 MG Tb24 tablet Commonly known as:  MYRBETRIQ TAKE 1 TABLET(25 MG) BY MOUTH DAILY What changed:    how much to take  how to take this  when to take this  additional instructions   pantoprazole 40 MG tablet Commonly known as:  PROTONIX Take 40 mg by mouth daily.   prasugrel 10 MG Tabs  tablet Commonly known as:  EFFIENT Take 10 mg by mouth daily.   pregabalin 75 MG capsule Commonly known as:  LYRICA Take 75 mg by mouth 2 (two) times daily.   PRESERVISION/LUTEIN Caps Take 1 capsule by mouth 2 (two) times daily.   REFRESH TEARS 0.5 % Soln Generic drug:  carboxymethylcellulose Apply 1-2 drops to eye at bedtime.   senna-docusate 8.6-50 MG tablet Commonly known as:  Senokot-S Take 1 tablet by mouth at bedtime as needed for mild constipation.   simvastatin 20 MG tablet Commonly known as:  ZOCOR Take 20 mg by mouth daily at 6 PM.   Verapamil HCl CR 300 MG Cp24 Take 1 capsule by mouth daily.   VITAMIN B-12 PO Take 1 tablet by mouth daily.        DISCHARGE INSTRUCTIONS:   DIET:  Cardiac diet DISCHARGE CONDITION:  Stable ACTIVITY:  Activity as tolerated OXYGEN:  Home Oxygen: No.  Oxygen Delivery: room air DISCHARGE LOCATION:  [facility] for CABG  If you experience worsening of your admission symptoms, develop shortness of breath, life threatening emergency, suicidal or homicidal thoughts you must seek medical attention immediately by calling 911 or calling your MD immediately  if symptoms less severe.  You Must read complete instructions/literature along with all the possible adverse reactions/side effects for all the Medicines you take and that have been prescribed to you. Take any new Medicines after you have completely understood and accpet all the possible adverse reactions/side effects.   Please note  You were cared for by a hospitalist during your hospital stay. If you have any questions about your discharge medications or the care you received while you were in the hospital after you are discharged, you can call the unit and asked to speak with the hospitalist on call if the hospitalist that took care of you is not available. Once you are discharged, your primary care physician will handle any further medical issues. Please note that NO REFILLS for any  discharge medications will be authorized once you are discharged, as it is imperative that you return to your primary care physician (or establish a relationship with a primary care physician if you do not have one) for your aftercare needs so that they can reassess your need for medications and monitor your lab values.    On the day of Discharge:  VITAL SIGNS:  Blood pressure (!) 149/68, pulse 85, temperature 98 F (36.7 C), temperature source Oral, resp. rate 19, height 5\' 1"  (1.549 m), weight 92.3 kg, SpO2 99 %. PHYSICAL EXAMINATION:  GENERAL:  82 y.o.-year-old patient lying in the bed with no acute distress.  EYES: Pupils equal, round, reactive to light and accommodation. No scleral icterus. Extraocular muscles intact.  HEENT: Head atraumatic, normocephalic. Oropharynx and nasopharynx clear.  NECK:  Supple, no jugular venous distention. No thyroid enlargement, no tenderness.  LUNGS:  Normal breath sounds bilaterally, no wheezing, rales,rhonchi or crepitation. No use of accessory muscles of respiration.  CARDIOVASCULAR: S1, S2 normal. No murmurs, rubs, or gallops.  ABDOMEN: Soft, non-tender, non-distended. Bowel sounds present. No organomegaly or mass.  EXTREMITIES: No pedal edema, cyanosis, or clubbing.  NEUROLOGIC: Cranial nerves II through XII are intact. Muscle strength 5/5 in all extremities. Sensation intact. Gait not checked.  PSYCHIATRIC: The patient is alert and oriented x 3.  SKIN: No obvious rash, lesion, or ulcer.  DATA REVIEW:   CBC Recent Labs  Lab 05/24/18 0310  WBC 6.3  HGB 11.4*  HCT 35.3*  PLT 213    Chemistries  Recent Labs  Lab 05/23/18 0315 05/24/18 0310  NA 135  --   K 3.3* 4.2  CL 99  --   CO2 26  --   GLUCOSE 201*  --   BUN 16  --   CREATININE 1.01* 1.12*  CALCIUM 8.8*  --      Microbiology Results    RADIOLOGY:  No results found.   Management plans discussed with the patient, family and they are in agreement.  CODE STATUS: Full Code     TOTAL TIME TAKING CARE OF THIS PATIENT: 45 minutes.    [name] M.D on 05/24/2018 at 6:08 PM  Between 7am to 6pm - Pager - [phone]  After 6pm go to [email or web address] - Proofreader  [facility]  Office  [phone]  CC: Primary care physician; Baxter Hire, MD   Note: This dictation was prepared with Dragon dictation along with smaller phrase technology. Any transcriptional errors that result from this process are unintentional.

## 2018-05-23 NOTE — Progress Notes (Signed)
Pt potassium was at 3.3. Notify prime.Will continue to monitor.  Update 0524. Dr. Aliene Altes ordered potassium chloride CR tablet 40 mEq two time daily. Will continue to monitor.

## 2018-05-24 LAB — GLUCOSE, CAPILLARY
GLUCOSE-CAPILLARY: 263 mg/dL — AB (ref 70–99)
Glucose-Capillary: 229 mg/dL — ABNORMAL HIGH (ref 70–99)
Glucose-Capillary: 138 mg/dL — ABNORMAL HIGH (ref 70–99)
Glucose-Capillary: 158 mg/dL — ABNORMAL HIGH (ref 70–99)

## 2018-05-24 LAB — CBC
HCT: 35.3 % — ABNORMAL LOW (ref 36.0–46.0)
Hemoglobin: 11.4 g/dL — ABNORMAL LOW (ref 12.0–15.0)
MCH: 25.9 pg — ABNORMAL LOW (ref 26.0–34.0)
MCHC: 32.3 g/dL (ref 30.0–36.0)
MCV: 80.2 fL (ref 80.0–100.0)
Platelets: 213 10*3/uL (ref 150–400)
RBC: 4.4 MIL/uL (ref 3.87–5.11)
RDW: 14.5 % (ref 11.5–15.5)
WBC: 6.3 10*3/uL (ref 4.0–10.5)
nRBC: 0 % (ref 0.0–0.2)

## 2018-05-24 LAB — CREATININE, SERUM
Creatinine, Ser: 1.12 mg/dL — ABNORMAL HIGH (ref 0.44–1.00)
GFR calc Af Amer: 54 mL/min — ABNORMAL LOW (ref >60)
GFR calc non Af Amer: 46 mL/min — ABNORMAL LOW (ref >60)

## 2018-05-24 LAB — POTASSIUM: Potassium: 4.2 mmol/L (ref 3.5–5.1)

## 2018-05-24 MED ORDER — SENNOSIDES-DOCUSATE SODIUM 8.6-50 MG PO TABS: 1.0000 | ORAL_TABLET | Freq: Every evening | ORAL | Status: DC | PRN

## 2018-05-24 MED ORDER — BISACODYL 5 MG PO TBEC
5.0000 mg | DELAYED_RELEASE_TABLET | Freq: Every day | ORAL | Status: DC | PRN
  Administered 2018-05-24: 5 mg via ORAL
  Filled 2018-05-24: qty 1

## 2018-05-24 MED ORDER — POLYETHYLENE GLYCOL 3350 17 G PO PACK
17.0000 g | PACK | Freq: Every day | ORAL | Status: DC
  Administered 2018-05-24: 17 g via ORAL
  Filled 2018-05-24: qty 1

## 2018-05-24 MED ORDER — SENNOSIDES-DOCUSATE SODIUM 8.6-50 MG PO TABS: 1.0000 | ORAL_TABLET | Freq: Every evening | ORAL | 0 refills | Status: DC | PRN

## 2018-05-24 MED ORDER — MENTHOL 3 MG MT LOZG
1.0000 | LOZENGE | OROMUCOSAL | Status: DC | PRN
  Administered 2018-05-24: 3 mg via ORAL
  Filled 2018-05-24: qty 9

## 2018-05-24 NOTE — Progress Notes (Signed)
[facility] at [place] NAME: Brandi [name]    MR#:  [id number]  DATE OF BIRTH:  1935-06-29  SUBJECTIVE:  CHIEF COMPLAINT:   Chief Complaint  Patient presents with  . Chest Pain  She had some chest pain today, responded to nitroglycerin tablet and started on oxygen and comfortable when I saw her.  REVIEW OF SYSTEMS:  Review of Systems  Constitutional: Negative for diaphoresis, fever, malaise/fatigue and weight loss.  HENT: Negative for ear discharge, ear pain, hearing loss, nosebleeds, sore throat and tinnitus.   Eyes: Negative for blurred vision and pain.  Respiratory: Negative for cough, hemoptysis, shortness of breath and wheezing.   Cardiovascular: Negative for chest pain, palpitations, orthopnea and leg swelling.  Gastrointestinal: Negative for abdominal pain, blood in stool, constipation, diarrhea, heartburn, nausea and vomiting.  Genitourinary: Negative for dysuria, frequency and urgency.  Musculoskeletal: Negative for back pain and myalgias.  Skin: Negative for itching and rash.  Neurological: Negative for dizziness, tingling, tremors, focal weakness, seizures, weakness and headaches.  Psychiatric/Behavioral: Negative for depression. The patient is not nervous/anxious.    DRUG ALLERGIES:   Allergies  Allergen Reactions  . Micardis [Telmisartan] Other (See Comments)    Dizziness  . Accupril [Quinapril Hcl] Rash  . Quinapril Rash   VITALS:  Blood pressure (!) 149/68, pulse 85, temperature 98 F (36.7 C), temperature source Oral, resp. rate 19, height 5\' 1"  (1.549 m), weight 92.3 kg, SpO2 99 %. PHYSICAL EXAMINATION:  Physical Exam  Constitutional: She is oriented to person, place, and time.  HENT:  Head: Normocephalic and atraumatic.  Eyes: Pupils are equal, round, and reactive to light. Conjunctivae and EOM are normal.  Neck: Normal range of motion. Neck supple. No tracheal deviation present. No thyromegaly present.    Cardiovascular: Normal rate, regular rhythm and normal heart sounds.  Pulmonary/Chest: Effort normal and breath sounds normal. No respiratory distress. She has no wheezes. She exhibits no tenderness.  Abdominal: Soft. Bowel sounds are normal. She exhibits no distension. There is no tenderness.  Musculoskeletal: Normal range of motion.  Neurological: She is alert and oriented to person, place, and time. No cranial nerve deficit.  Skin: Skin is warm and dry. No rash noted.   LABORATORY PANEL:  Female CBC Recent Labs  Lab 05/24/18 0310  WBC 6.3  HGB 11.4*  HCT 35.3*  PLT 213   ------------------------------------------------------------------------------------------------------------------ Chemistries  Recent Labs  Lab 05/23/18 0315 05/24/18 0310  NA 135  --   K 3.3* 4.2  CL 99  --   CO2 26  --   GLUCOSE 201*  --   BUN 16  --   CREATININE 1.01* 1.12*  CALCIUM 8.8*  --    RADIOLOGY:  No results found. ASSESSMENT AND PLAN:  Patient is 82 year old presenting with chest pain  1.  Unstable Angina with  coronary artery disease - Per cardio - abnormal stress test - Continue aspirin, Plavix - Cardiac catheterization showing normal LV systolic function with ejection fraction of 55 to 60%. Significant restenosis of proximal LAD of 90% with some distal 50% stenosis at first diagonal, 80% circumflex and 90% proximal right coronary artery -Cardiology recommends CABG and waiting for transfer to [facility] per Dr Nehemiah Massed -Patient had some anginal chest pain, responded to nitroglycerin today.  Started on oxygen. -Still waiting for bed at [facility].  2.  Hypertension - continue Coreg, HCTZ, Losartan, Verapamil.  3.  Diabetes type 2 - sliding scale insulin as well as Lantus  9 units  4.  Sleep apnea CPAP as need  5.  GERD continue Protonix  6.  Hyperlipidemia continue Zocor    waiting for bed at [facility] records are reviewed and case discussed with Care Management/Social  Worker. Management plans discussed with the patient, family (husband at bedside), Dr. Nehemiah Massed and they are in agreement.  CODE STATUS: Full Code  TOTAL TIME TAKING CARE OF THIS PATIENT: 35 minutes.   More than 50% of the time was spent in counseling/coordination of care: YES  POSSIBLE D/C IN 1 DAYS, DEPENDING ON CLINICAL CONDITION.   [name] M.D on 05/24/2018 at 6:02 PM  Between 7am to 6pm - Pager - [phone]  After 6pm go to [email or web address] - Proofreader  [facility]  Office  [phone]  CC: Primary care physician; Baxter Hire, MD  Note: This dictation was prepared with Dragon dictation along with smaller phrase technology. Any transcriptional errors that result from this process are unintentional.

## 2018-05-24 NOTE — Consult Note (Signed)
ANTICOAGULATION CONSULT NOTE - Initial Consult  Pharmacy Consult for Lovenox Indication: chest pain/ACS (note indicates suspected NSTEMI)  Allergies  Allergen Reactions  . Micardis [Telmisartan] Other (See Comments)    Dizziness  . Accupril [Quinapril Hcl] Rash  . Quinapril Rash    Patient Measurements: Height: 5\' 1"  (154.9 cm) Weight: 203 lb 7.8 oz (92.3 kg) IBW/kg (Calculated) : 47.8   Vital Signs: Temp: 98.6 F (37 C) (12/06 0609) Temp Source: Axillary (12/06 0527) BP: 153/58 (12/06 0609) Pulse Rate: 76 (12/06 0609)  Labs: Recent Labs    05/22/18 0347 05/23/18 0315 05/24/18 0310  HGB 12.4 11.6* 11.4*  HCT 38.3 36.2 35.3*  PLT 227 213 213  CREATININE 0.79 1.01* 1.12*    Estimated Creatinine Clearance: 41.5 mL/min (A) (by C-G formula based on SCr of 1.12 mg/dL (H)).   Medical History: Past Medical History:  Diagnosis Date  . Anemia   . Anxiety   . Arthritis, degenerative   . Chronic cystitis   . Depression   . Diabetes mellitus without complication ([place])   . GERD (gastroesophageal reflux disease)   . Headache   . Heart disease   . HLD (hyperlipidemia)   . HTN (hypertension)   . Hypertension   . Incontinence in female   . Murmur   . Obesity   . Sleep apnea   . Urge incontinence of urine     Medications:  ASA 81mg , carvedilol 3.125mg , Prasugrel  Assessment: Patient is being treated with Lovenox therapy for NSTEMI  Goal of Therapy:  Monitor platelets by anticoagulation protocol: Yes  Plan:   Lovenox dosed at 1mg /kg q12hr at actual body weight (95mg  q 12) Will monitor CBCs and Scr daily for any dosing adjustments  Forrest Moron, PharmD Clinical Pharmacist 05/24/2018 7:42 AM

## 2018-05-24 NOTE — Plan of Care (Signed)
  Problem: Health Behavior/Discharge Planning: Goal: Ability to manage health-related needs will improve Outcome: Progressing   Problem: Clinical Measurements: Goal: Ability to maintain clinical measurements within normal limits will improve Outcome: Progressing   Problem: Pain Managment: Goal: General experience of comfort will improve Outcome: Progressing   Problem: Safety: Goal: Ability to remain free from injury will improve Outcome: Progressing   Problem: Education: Goal: Understanding of cardiac disease, CV risk reduction, and recovery process will improve Outcome: Progressing   Problem: Activity: Goal: Ability to tolerate increased activity will improve Outcome: Progressing   Problem: Cardiovascular: Goal: Vascular access site(s) Level 0-1 will be maintained Outcome: Progressing

## 2018-05-24 NOTE — Care Management Important Message (Signed)
Copy of signed IM left with patient in room.  

## 2018-05-24 NOTE — Progress Notes (Signed)
Pt. C/o 4/10 chest discomfort- pt states " its not really pain but discomfort"/ no changes on tele - NSR/ no distress noted/ BP elevated- meds given/ 1 nitro given - pain improved 2/10/ o2 applied for comfort/ will repeat EKG/ Dr. Nehemiah Massed paged to make aware- MD also made aware that pt went into afib but has returned to a NSR during the night/ orders to give 2 nd nitro and monitor close.

## 2018-05-24 NOTE — Progress Notes (Addendum)
Pt states that her LBM was 05/20/18. Notify prime. Will continue to monitor.  Update 0358: Dr. Aliene Altes ordered miralax/glycolax packet 17 g oral daily, Senna-docusate ( Senokot-S) 1 tablet oral at bedtime PRN, and dulcolax EC tablet 5 mg oral daily PRN. Will continue to monitor.  Update 0520: CCMD called and reported that pt HR went up to 150's and have an episode of SR going to A-flutter then SR. Pt was on CPAP and was sleep when nurse come to the room.VSS stable and no sign of distress on assessment. Notify prime. Will continue to monitor.  Update 0528: Dulcolax was administered. Will continue to monitor.

## 2018-05-25 DIAGNOSIS — F419 Anxiety disorder, unspecified: Secondary | ICD-10-CM | POA: Insufficient documentation

## 2018-05-25 DIAGNOSIS — Z8719 Personal history of other diseases of the digestive system: Secondary | ICD-10-CM | POA: Insufficient documentation

## 2018-05-25 DIAGNOSIS — I4891 Unspecified atrial fibrillation: Secondary | ICD-10-CM | POA: Insufficient documentation

## 2018-05-25 DIAGNOSIS — E785 Hyperlipidemia, unspecified: Secondary | ICD-10-CM | POA: Insufficient documentation

## 2018-05-27 DIAGNOSIS — Z955 Presence of coronary angioplasty implant and graft: Secondary | ICD-10-CM | POA: Insufficient documentation

## 2018-05-29 DIAGNOSIS — Z9889 Other specified postprocedural states: Secondary | ICD-10-CM

## 2018-05-29 DIAGNOSIS — Z951 Presence of aortocoronary bypass graft: Secondary | ICD-10-CM

## 2018-05-29 HISTORY — PX: LEFT ATRIAL APPENDAGE OCCLUSION: EP1229

## 2018-05-29 HISTORY — DX: Presence of aortocoronary bypass graft: Z95.1

## 2018-05-29 HISTORY — PX: CORONARY ARTERY BYPASS GRAFT: SHX141

## 2018-05-29 HISTORY — DX: Other specified postprocedural states: Z98.890

## 2018-06-04 DIAGNOSIS — Z951 Presence of aortocoronary bypass graft: Secondary | ICD-10-CM | POA: Insufficient documentation

## 2018-06-05 DIAGNOSIS — Z789 Other specified health status: Secondary | ICD-10-CM | POA: Insufficient documentation

## 2018-06-05 DIAGNOSIS — R6889 Other general symptoms and signs: Secondary | ICD-10-CM | POA: Insufficient documentation

## 2018-06-05 DIAGNOSIS — R531 Weakness: Secondary | ICD-10-CM | POA: Insufficient documentation

## 2018-07-03 ENCOUNTER — Other Ambulatory Visit: Payer: Self-pay | Admitting: Urology

## 2018-07-03 DIAGNOSIS — N3946 Mixed incontinence: Secondary | ICD-10-CM

## 2018-10-24 ENCOUNTER — Other Ambulatory Visit: Payer: Self-pay | Admitting: Urology

## 2018-10-24 DIAGNOSIS — N3946 Mixed incontinence: Secondary | ICD-10-CM

## 2018-10-24 MED ORDER — MIRABEGRON ER 25 MG PO TB24: 25.0000 mg | ORAL_TABLET | Freq: Every day | ORAL | 3 refills | Status: DC

## 2018-10-24 NOTE — Telephone Encounter (Signed)
Pt would like a refill of Myrbetriq 25 mg sent to [facility]. [address] and [place].

## 2018-10-24 NOTE — Telephone Encounter (Signed)
Refill sent.

## 2018-12-04 ENCOUNTER — Other Ambulatory Visit: Payer: Self-pay

## 2018-12-06 DIAGNOSIS — E113299 Type 2 diabetes mellitus with mild nonproliferative diabetic retinopathy without macular edema, unspecified eye: Secondary | ICD-10-CM | POA: Insufficient documentation

## 2018-12-26 ENCOUNTER — Other Ambulatory Visit: Payer: Self-pay | Admitting: Internal Medicine

## 2018-12-26 DIAGNOSIS — Z1231 Encounter for screening mammogram for malignant neoplasm of breast: Secondary | ICD-10-CM

## 2019-01-23 ENCOUNTER — Encounter: Payer: Medicare Other | Attending: Cardiology | Admitting: *Deleted

## 2019-01-23 ENCOUNTER — Other Ambulatory Visit: Payer: Self-pay

## 2019-01-23 DIAGNOSIS — Z951 Presence of aortocoronary bypass graft: Secondary | ICD-10-CM | POA: Insufficient documentation

## 2019-01-23 DIAGNOSIS — Z791 Long term (current) use of non-steroidal anti-inflammatories (NSAID): Secondary | ICD-10-CM | POA: Insufficient documentation

## 2019-01-23 DIAGNOSIS — Z7982 Long term (current) use of aspirin: Secondary | ICD-10-CM | POA: Insufficient documentation

## 2019-01-23 DIAGNOSIS — E669 Obesity, unspecified: Secondary | ICD-10-CM | POA: Insufficient documentation

## 2019-01-23 DIAGNOSIS — Z79899 Other long term (current) drug therapy: Secondary | ICD-10-CM | POA: Insufficient documentation

## 2019-01-23 DIAGNOSIS — K219 Gastro-esophageal reflux disease without esophagitis: Secondary | ICD-10-CM | POA: Insufficient documentation

## 2019-01-23 DIAGNOSIS — Z794 Long term (current) use of insulin: Secondary | ICD-10-CM | POA: Insufficient documentation

## 2019-01-23 DIAGNOSIS — F419 Anxiety disorder, unspecified: Secondary | ICD-10-CM | POA: Insufficient documentation

## 2019-01-23 DIAGNOSIS — E119 Type 2 diabetes mellitus without complications: Secondary | ICD-10-CM | POA: Insufficient documentation

## 2019-01-23 DIAGNOSIS — E785 Hyperlipidemia, unspecified: Secondary | ICD-10-CM | POA: Insufficient documentation

## 2019-01-23 DIAGNOSIS — M1990 Unspecified osteoarthritis, unspecified site: Secondary | ICD-10-CM | POA: Insufficient documentation

## 2019-01-23 DIAGNOSIS — Z7902 Long term (current) use of antithrombotics/antiplatelets: Secondary | ICD-10-CM | POA: Insufficient documentation

## 2019-01-23 DIAGNOSIS — F329 Major depressive disorder, single episode, unspecified: Secondary | ICD-10-CM | POA: Insufficient documentation

## 2019-01-23 DIAGNOSIS — G4730 Sleep apnea, unspecified: Secondary | ICD-10-CM | POA: Insufficient documentation

## 2019-01-23 NOTE — Progress Notes (Signed)
Virtual Orientation with RN completed. Diagnosis can be found in [facility] 05/25/18. EP/RD orientation scheduled 8/10 at 2

## 2019-01-27 ENCOUNTER — Encounter: Payer: Medicare Other | Admitting: *Deleted

## 2019-01-27 ENCOUNTER — Other Ambulatory Visit: Payer: Self-pay

## 2019-01-27 VITALS — Ht 61.0 in | Wt 198.4 lb

## 2019-01-27 DIAGNOSIS — K219 Gastro-esophageal reflux disease without esophagitis: Secondary | ICD-10-CM | POA: Diagnosis not present

## 2019-01-27 DIAGNOSIS — Z794 Long term (current) use of insulin: Secondary | ICD-10-CM | POA: Diagnosis not present

## 2019-01-27 DIAGNOSIS — Z7982 Long term (current) use of aspirin: Secondary | ICD-10-CM | POA: Diagnosis not present

## 2019-01-27 DIAGNOSIS — E669 Obesity, unspecified: Secondary | ICD-10-CM | POA: Diagnosis not present

## 2019-01-27 DIAGNOSIS — G4730 Sleep apnea, unspecified: Secondary | ICD-10-CM | POA: Diagnosis not present

## 2019-01-27 DIAGNOSIS — M1990 Unspecified osteoarthritis, unspecified site: Secondary | ICD-10-CM | POA: Diagnosis not present

## 2019-01-27 DIAGNOSIS — E119 Type 2 diabetes mellitus without complications: Secondary | ICD-10-CM | POA: Diagnosis not present

## 2019-01-27 DIAGNOSIS — F329 Major depressive disorder, single episode, unspecified: Secondary | ICD-10-CM | POA: Diagnosis not present

## 2019-01-27 DIAGNOSIS — Z951 Presence of aortocoronary bypass graft: Secondary | ICD-10-CM | POA: Diagnosis present

## 2019-01-27 DIAGNOSIS — Z7902 Long term (current) use of antithrombotics/antiplatelets: Secondary | ICD-10-CM | POA: Diagnosis not present

## 2019-01-27 DIAGNOSIS — F419 Anxiety disorder, unspecified: Secondary | ICD-10-CM | POA: Diagnosis not present

## 2019-01-27 DIAGNOSIS — E785 Hyperlipidemia, unspecified: Secondary | ICD-10-CM | POA: Diagnosis not present

## 2019-01-27 DIAGNOSIS — Z791 Long term (current) use of non-steroidal anti-inflammatories (NSAID): Secondary | ICD-10-CM | POA: Diagnosis not present

## 2019-01-27 DIAGNOSIS — Z79899 Other long term (current) drug therapy: Secondary | ICD-10-CM | POA: Diagnosis not present

## 2019-01-27 NOTE — Progress Notes (Signed)
Cardiac Individual Treatment Plan  Patient Details  Name: Brandi [name] MRN: [id number] Date of Birth: [birth year] Referring Provider:     Cardiac Rehab from 01/27/2019 in [facility] Cardiac and Pulmonary Rehab  Referring Provider  Bartholome Bill MD      Initial Encounter Date:    Cardiac Rehab from 01/27/2019 in [facility] Cardiac and Pulmonary Rehab  Date  01/27/19      Visit Diagnosis: S/P CABG x 3  Patient's Home Medications on Admission:  Current Outpatient Medications:  .  acetaminophen (TYLENOL) 500 MG tablet, Take by mouth., Disp: , Rfl:  .  albuterol (VENTOLIN HFA) 108 (90 Base) MCG/ACT inhaler, , Disp: , Rfl:  .  aspirin EC 81 MG tablet, Take 81 mg by mouth daily., Disp: , Rfl:  .  atorvastatin (LIPITOR) 40 MG tablet, Take by mouth., Disp: , Rfl:  .  budesonide-formoterol (SYMBICORT) 80-4.5 MCG/ACT inhaler, Inhale 2 puffs into the lungs 2 (two) times daily., Disp: , Rfl:  .  Calcium Carb-Cholecalciferol (CALCIUM + D3 PO), Take 1 tablet by mouth daily., Disp: , Rfl:  .  carboxymethylcellulose (REFRESH TEARS) 0.5 % SOLN, Apply 1-2 drops to eye at bedtime. , Disp: , Rfl:  .  carvedilol (COREG) 6.25 MG tablet, Take 6.25 mg by mouth 2 (two) times daily with a meal. , Disp: , Rfl:  .  clopidogrel (PLAVIX) 75 MG tablet, Take 75 mg by mouth daily., Disp: , Rfl:  .  conjugated estrogens (PREMARIN) vaginal cream, Place 1 Applicatorful vaginally daily. Apply 0.43m (pea-sized amount)  just inside the vaginal introitus with a finger-tip every night for two weeks and then Monday, Wednesday and Friday nights. (Patient not taking: Reported on 01/23/2019), Disp: 30 g, Rfl: 12 .  Cyanocobalamin (VITAMIN B-12 PO), Take 1 tablet by mouth daily., Disp: , Rfl:  .  gabapentin (NEURONTIN) 100 MG capsule, , Disp: , Rfl:  .  glipiZIDE (GLUCOTROL) 5 MG tablet, Take 5 mg by mouth 2 (two) times daily. , Disp: , Rfl:  .  hydrochlorothiazide (HYDRODIURIL) 12.5 MG tablet, , Disp: , Rfl: 1 .  Insulin Glargine (LANTUS  SOLOSTAR) 100 UNIT/ML Solostar Pen, Inject 12 Units into the skin at bedtime. , Disp: , Rfl:  .  JANUMET XR 50-1000 MG TB24, Take 1 tablet by mouth 2 (two) times daily., Disp: , Rfl:  .  losartan (COZAAR) 100 MG tablet, Take 100 mg by mouth daily., Disp: , Rfl:  .  meloxicam (MOBIC) 15 MG tablet, Take 15 mg by mouth daily., Disp: , Rfl:  .  mirabegron ER (MYRBETRIQ) 25 MG TB24 tablet, Take 1 tablet (25 mg total) by mouth daily., Disp: 30 tablet, Rfl: 3 .  Multiple Vitamins-Minerals (PRESERVISION/LUTEIN) CAPS, Take 1 capsule by mouth 2 (two) times daily. , Disp: , Rfl:  .  pantoprazole (PROTONIX) 40 MG tablet, Take 40 mg by mouth daily., Disp: , Rfl:  .  prasugrel (EFFIENT) 10 MG TABS tablet, Take 10 mg by mouth daily., Disp: , Rfl:  .  pregabalin (LYRICA) 75 MG capsule, Take 75 mg by mouth 2 (two) times daily., Disp: , Rfl:  .  senna-docusate (SENOKOT-S) 8.6-50 MG tablet, Take 1 tablet by mouth at bedtime as needed for mild constipation., Disp: 10 tablet, Rfl: 0 .  simvastatin (ZOCOR) 20 MG tablet, Take 20 mg by mouth daily at 6 PM. , Disp: , Rfl:  .  SitaGLIPtin-MetFORMIN HCl (JANUMET XR) 50-1000 MG TB24, Take 1 tablet by mouth 2 (two) times daily., Disp: , Rfl:  .  sodium chloride (ALTAMIST SPRAY) 0.65 % nasal spray, Place into the nose., Disp: , Rfl:  .  traZODone (DESYREL) 50 MG tablet, , Disp: , Rfl:  .  venlafaxine (EFFEXOR) 37.5 MG tablet, , Disp: , Rfl:  .  Verapamil HCl CR 300 MG CP24, Take 1 capsule by mouth daily., Disp: , Rfl:   Past Medical History: Past Medical History:  Diagnosis Date  . Anemia   . Anxiety   . Arthritis, degenerative   . Chronic cystitis   . Depression   . Diabetes mellitus without complication ([place])   . GERD (gastroesophageal reflux disease)   . Headache   . Heart disease   . HLD (hyperlipidemia)   . HTN (hypertension)   . Hypertension   . Incontinence in female   . Murmur   . Obesity   . Sleep apnea   . Urge incontinence of urine     Tobacco  Use: Social History   Tobacco Use  Smoking Status Never Smoker  Smokeless Tobacco Never Used    Labs: Recent Review Flowsheet Data    Labs for ITP Cardiac and Pulmonary Rehab Latest Ref Rng & Units 06/30/2011 04/16/2016   Cholestrol 0 - 200 mg/dL 141 -   LDLCALC 0 - 100 mg/dL 64 -   HDL 40 - 60 mg/dL 63(H) -   Trlycerides 0 - 200 mg/dL 71 -   Hemoglobin A1c 4.8 - 5.6 % 7.7(H) 7.3(H)       Exercise Target Goals: Exercise Program Goal: Individual exercise prescription set using results from initial 6 min walk test and THRR while considering  patient's activity barriers and safety.   Exercise Prescription Goal: Initial exercise prescription builds to 30-45 minutes a day of aerobic activity, 2-3 days per week.  Home exercise guidelines will be given to patient during program as part of exercise prescription that the participant will acknowledge.  Activity Barriers & Risk Stratification: Activity Barriers & Cardiac Risk Stratification - 01/27/19 1613      Activity Barriers & Cardiac Risk Stratification   Activity Barriers  Back Problems;Arthritis;Joint Problems;Deconditioning;Balance Concerns;Muscular Weakness;Shortness of Breath;Assistive Device;History of Falls    Cardiac Risk Stratification  High       6 Minute Walk: 6 Minute Walk    Row Name 01/27/19 1612         6 Minute Walk   Phase  Initial     Distance  620 feet     Walk Time  5.5 minutes     # of Rest Breaks  0     MPH  1.28     METS  1.15     RPE  17     Perceived Dyspnea   2     VO2 Peak  4.02     Symptoms  Yes (comment)     Comments  winded, fatigue     Resting HR  81 bpm     Resting BP  142/74     Resting Oxygen Saturation   97 %     Exercise Oxygen Saturation  during 6 min walk  96 %     Max Ex. HR  134 bpm     Max Ex. BP  156/76     2 Minute Post BP  142/70        Oxygen Initial Assessment:   Oxygen Re-Evaluation:   Oxygen Discharge (Final Oxygen Re-Evaluation):   Initial Exercise  Prescription: Initial Exercise Prescription - 01/27/19 1600      Date of  Initial Exercise RX and Referring Provider   Date  01/27/19    Referring Provider  Bartholome Bill MD      Recumbant Bike   Level  1    RPM  50    Watts  5    Minutes  15    METs  1.1      NuStep   Level  1    SPM  80    Minutes  15    METs  1      Arm Ergometer   Level  1    RPM  25    Minutes  15    METs  1      Recumbant Elliptical   Level  1    RPM  50    Minutes  15    METs  1      REL-XR   Level  1    Speed  50    Minutes  15    METs  1.2      T5 Nustep   Level  1    SPM  80    Minutes  15    METs  1      Biostep-RELP   Level  1    SPM  50    Minutes  15    METs  1      Prescription Details   Frequency (times per week)  3    Duration  Progress to 30 minutes of continuous aerobic without signs/symptoms of physical distress      Intensity   THRR 40-80% of Max Heartrate  105-128    Ratings of Perceived Exertion  11-13    Perceived Dyspnea  0-4      Progression   Progression  Continue to progress workloads to maintain intensity without signs/symptoms of physical distress.      Resistance Training   Training Prescription  Yes    Weight  2 lbs    Reps  10-15       Perform Capillary Blood Glucose checks as needed.  Exercise Prescription Changes: Exercise Prescription Changes    Row Name 01/27/19 1600             Response to Exercise   Blood Pressure (Admit)  142/74       Blood Pressure (Exercise)  156/76       Blood Pressure (Exit)  142/70       Heart Rate (Admit)  81 bpm       Heart Rate (Exercise)  134 bpm       Heart Rate (Exit)  83 bpm       Oxygen Saturation (Admit)  97 %       Oxygen Saturation (Exercise)  96 %       Rating of Perceived Exertion (Exercise)  17       Perceived Dyspnea (Exercise)  2       Symptoms  winded, fatigue       Comments  walk test results          Exercise Comments:   Exercise Goals and Review: Exercise Goals    Row  Name 01/27/19 1617             Exercise Goals   Increase Physical Activity  Yes       Intervention  Provide advice, education, support and counseling about physical activity/exercise needs.;Develop an individualized exercise prescription for aerobic and resistive training based on initial evaluation findings,  risk stratification, comorbidities and participant's personal goals.       Expected Outcomes  Short Term: Attend rehab on a regular basis to increase amount of physical activity.;Long Term: Add in home exercise to make exercise part of routine and to increase amount of physical activity.;Long Term: Exercising regularly at least 3-5 days a week.       Increase Strength and Stamina  Yes       Intervention  Provide advice, education, support and counseling about physical activity/exercise needs.;Develop an individualized exercise prescription for aerobic and resistive training based on initial evaluation findings, risk stratification, comorbidities and participant's personal goals.       Expected Outcomes  Short Term: Increase workloads from initial exercise prescription for resistance, speed, and METs.;Short Term: Perform resistance training exercises routinely during rehab and add in resistance training at home;Long Term: Improve cardiorespiratory fitness, muscular endurance and strength as measured by increased METs and functional capacity (6MWT)       Able to understand and use rate of perceived exertion (RPE) scale  Yes       Intervention  Provide education and explanation on how to use RPE scale       Expected Outcomes  Long Term:  Able to use RPE to guide intensity level when exercising independently;Short Term: Able to use RPE daily in rehab to express subjective intensity level       Able to understand and use Dyspnea scale  Yes       Intervention  Provide education and explanation on how to use Dyspnea scale       Expected Outcomes  Short Term: Able to use Dyspnea scale daily in rehab  to express subjective sense of shortness of breath during exertion;Long Term: Able to use Dyspnea scale to guide intensity level when exercising independently       Knowledge and understanding of Target Heart Rate Range (THRR)  Yes       Intervention  Provide education and explanation of THRR including how the numbers were predicted and where they are located for reference       Expected Outcomes  Short Term: Able to state/look up THRR;Short Term: Able to use daily as guideline for intensity in rehab;Long Term: Able to use THRR to govern intensity when exercising independently       Able to check pulse independently  Yes       Intervention  Provide education and demonstration on how to check pulse in carotid and radial arteries.;Review the importance of being able to check your own pulse for safety during independent exercise       Expected Outcomes  Short Term: Able to explain why pulse checking is important during independent exercise;Long Term: Able to check pulse independently and accurately       Understanding of Exercise Prescription  Yes       Intervention  Provide education, explanation, and written materials on patient's individual exercise prescription       Expected Outcomes  Short Term: Able to explain program exercise prescription;Long Term: Able to explain home exercise prescription to exercise independently          Exercise Goals Re-Evaluation :   Discharge Exercise Prescription (Final Exercise Prescription Changes): Exercise Prescription Changes - 01/27/19 1600      Response to Exercise   Blood Pressure (Admit)  142/74    Blood Pressure (Exercise)  156/76    Blood Pressure (Exit)  142/70    Heart Rate (Admit)  81 bpm  Heart Rate (Exercise)  134 bpm    Heart Rate (Exit)  83 bpm    Oxygen Saturation (Admit)  97 %    Oxygen Saturation (Exercise)  96 %    Rating of Perceived Exertion (Exercise)  17    Perceived Dyspnea (Exercise)  2    Symptoms  winded, fatigue     Comments  walk test results       Nutrition:  Target Goals: Understanding of nutrition guidelines, daily intake of sodium '1500mg'$ , cholesterol '200mg'$ , calories 30% from fat and 7% or less from saturated fats, daily to have 5 or more servings of fruits and vegetables.  Biometrics: Pre Biometrics - 01/27/19 1617      Pre Biometrics   Height  '5\' 1"'$  (1.549 m)    Weight  198 lb 6.4 oz (90 kg)    BMI (Calculated)  37.51    Single Leg Stand  0.37 seconds        Nutrition Therapy Plan and Nutrition Goals: Nutrition Therapy & Goals - 01/27/19 1526      Nutrition Therapy   Diet  Low Na, HH, DM diet    Drug/Food Interactions  Statins/Certain Fruits   lipitor   Protein (specify units)  75g    Fiber  25 grams    Whole Grain Foods  3 servings    Saturated Fats  12 max. grams    Fruits and Vegetables  5 servings/day    Sodium  1.5 grams      Personal Nutrition Goals   Nutrition Goal  ST: increase vegetable intake (+1 serving 3x/week) LT: walk again and get rid of cane.    Comments  Lantus, janumet, glipizidesenokot, coregCa + D3 A1c 10/2018 was 6.7 (WNL). Pt reports for B she eats grits, eggos, special K or cinnamon cheerios with whole milk. L tomato sandwich with full fat mayo and white bread. Dinner is chicken with the skin and/or bacon, baked pork chops and instant potatoes or white pasta. Pt will use margarine, high fat protein, whole fat dairy, fried foods at least 2x/week, minimal F and V. Pt suggested she needed to eat more vegetables, but would still like to review the content. Discussed HH DM eating.      Intervention Plan   Intervention  Prescribe, educate and counsel regarding individualized specific dietary modifications aiming towards targeted core components such as weight, hypertension, lipid management, diabetes, heart failure and other comorbidities.;Nutrition handout(s) given to patient.    Expected Outcomes  Short Term Goal: Understand basic principles of dietary content,  such as calories, fat, sodium, cholesterol and nutrients.;Long Term Goal: Adherence to prescribed nutrition plan.;Short Term Goal: A plan has been developed with personal nutrition goals set during dietitian appointment.       Nutrition Assessments: Nutrition Assessments - 01/27/19 1542      MEDFICTS Scores   Pre Score  100       Nutrition Goals Re-Evaluation:   Nutrition Goals Discharge (Final Nutrition Goals Re-Evaluation):   Psychosocial: Target Goals: Acknowledge presence or absence of significant depression and/or stress, maximize coping skills, provide positive support system. Participant is able to verbalize types and ability to use techniques and skills needed for reducing stress and depression.   Initial Review & Psychosocial Screening: Initial Psych Review & Screening - 01/23/19 1151      Initial Review   Current issues with  Current Sleep Concerns;Current Stress Concerns    Comments  Suella is trying to regulate her sleep still from her admission  in December. She is taking trazodone, but is still drowsy most of the morning so she is trying to slowly come off of it. She reported her only source of stress right now is Covid. She lives with 2 of her children, so they have been making sure she has everything she needs. She has not been out much since March.      Family Dynamics   Good Support System?  Yes      Barriers   Psychosocial barriers to participate in program  There are no identifiable barriers or psychosocial needs.;The patient should benefit from training in stress management and relaxation.      Screening Interventions   Interventions  Encouraged to exercise;Provide feedback about the scores to participant    Expected Outcomes  Short Term goal: Utilizing psychosocial counselor, staff and physician to assist with identification of specific Stressors or current issues interfering with healing process. Setting desired goal for each stressor or current issue  identified.;Long Term Goal: Stressors or current issues are controlled or eliminated.;Short Term goal: Identification and review with participant of any Quality of Life or Depression concerns found by scoring the questionnaire.;Long Term goal: The participant improves quality of Life and PHQ9 Scores as seen by post scores and/or verbalization of changes       Quality of Life Scores:  Quality of Life - 01/27/19 1618      Quality of Life   Select  Quality of Life      Quality of Life Scores   Health/Function Pre  17.58 %    Socioeconomic Pre  20.86 %    Psych/Spiritual Pre  20 %    Family Pre  23.63 %    GLOBAL Pre  19.72 %      Scores of 19 and below usually indicate a poorer quality of life in these areas.  A difference of  2-3 points is a clinically meaningful difference.  A difference of 2-3 points in the total score of the Quality of Life Index has been associated with significant improvement in overall quality of life, self-image, physical symptoms, and general health in studies assessing change in quality of life.  PHQ-9: Recent Review Flowsheet Data    Depression screen [facility] 2/9 01/27/2019   Decreased Interest 1   Down, Depressed, Hopeless 1   PHQ - 2 Score 2   Altered sleeping 2   Tired, decreased energy 1   Change in appetite 2   Feeling bad or failure about yourself  1   Trouble concentrating 0   Moving slowly or fidgety/restless 0   Suicidal thoughts 0   PHQ-9 Score 8   Difficult doing work/chores Somewhat difficult     Interpretation of Total Score  Total Score Depression Severity:  1-4 = Minimal depression, 5-9 = Mild depression, 10-14 = Moderate depression, 15-19 = Moderately severe depression, 20-27 = Severe depression   Psychosocial Evaluation and Intervention:   Psychosocial Re-Evaluation:   Psychosocial Discharge (Final Psychosocial Re-Evaluation):   Vocational Rehabilitation: Provide vocational rehab assistance to qualifying candidates.    Vocational Rehab Evaluation & Intervention: Vocational Rehab - 01/23/19 1143      Initial Vocational Rehab Evaluation & Intervention   Assessment shows need for Vocational Rehabilitation  No       Education: Education Goals: Education classes will be provided on a variety of topics geared toward better understanding of heart health and risk factor modification. Participant will state understanding/return demonstration of topics presented as noted by education test scores.  Learning Barriers/Preferences: Learning Barriers/Preferences - 01/23/19 1143      Learning Barriers/Preferences   Learning Barriers  None    Learning Preferences  Individual Instruction       Education Topics:  AED/CPR: - Group verbal and written instruction with the use of models to demonstrate the basic use of the AED with the basic ABC's of resuscitation.   General Nutrition Guidelines/Fats and Fiber: -Group instruction provided by verbal, written material, models and posters to present the general guidelines for heart healthy nutrition. Gives an explanation and review of dietary fats and fiber.   Controlling Sodium/Reading Food Labels: -Group verbal and written material supporting the discussion of sodium use in heart healthy nutrition. Review and explanation with models, verbal and written materials for utilization of the food label.   Exercise Physiology & General Exercise Guidelines: - Group verbal and written instruction with models to review the exercise physiology of the cardiovascular system and associated critical values. Provides general exercise guidelines with specific guidelines to those with heart or lung disease.    Aerobic Exercise & Resistance Training: - Gives group verbal and written instruction on the various components of exercise. Focuses on aerobic and resistive training programs and the benefits of this training and how to safely progress through these programs..   Flexibility,  Balance, Mind/Body Relaxation: Provides group verbal/written instruction on the benefits of flexibility and balance training, including mind/body exercise modes such as yoga, pilates and tai chi.  Demonstration and skill practice provided.   Stress and Anxiety: - Provides group verbal and written instruction about the health risks of elevated stress and causes of high stress.  Discuss the correlation between heart/lung disease and anxiety and treatment options. Review healthy ways to manage with stress and anxiety.   Depression: - Provides group verbal and written instruction on the correlation between heart/lung disease and depressed mood, treatment options, and the stigmas associated with seeking treatment.   Anatomy & Physiology of the Heart: - Group verbal and written instruction and models provide basic cardiac anatomy and physiology, with the coronary electrical and arterial systems. Review of Valvular disease and Heart Failure   Cardiac Procedures: - Group verbal and written instruction to review commonly prescribed medications for heart disease. Reviews the medication, class of the drug, and side effects. Includes the steps to properly store meds and maintain the prescription regimen. (beta blockers and nitrates)   Cardiac Medications I: - Group verbal and written instruction to review commonly prescribed medications for heart disease. Reviews the medication, class of the drug, and side effects. Includes the steps to properly store meds and maintain the prescription regimen.   Cardiac Medications II: -Group verbal and written instruction to review commonly prescribed medications for heart disease. Reviews the medication, class of the drug, and side effects. (all other drug classes)    Go Sex-Intimacy & Heart Disease, Get SMART - Goal Setting: - Group verbal and written instruction through game format to discuss heart disease and the return to sexual intimacy. Provides group  verbal and written material to discuss and apply goal setting through the application of the S.M.A.R.T. Method.   Other Matters of the Heart: - Provides group verbal, written materials and models to describe Stable Angina and Peripheral Artery. Includes description of the disease process and treatment options available to the cardiac patient.   Exercise & Equipment Safety: - Individual verbal instruction and demonstration of equipment use and safety with use of the equipment.   Cardiac Rehab from 01/27/2019 in [facility]  Cardiac and Pulmonary Rehab  Date  01/27/19  Educator  [facility]  Instruction Review Code  1- Verbalizes Understanding      Infection Prevention: - Provides verbal and written material to individual with discussion of infection control including proper hand washing and proper equipment cleaning during exercise session.   Cardiac Rehab from 01/27/2019 in [facility] Cardiac and Pulmonary Rehab  Date  01/27/19  Educator  [facility]  Instruction Review Code  1- Verbalizes Understanding      Falls Prevention: - Provides verbal and written material to individual with discussion of falls prevention and safety.   Cardiac Rehab from 01/27/2019 in [facility] Cardiac and Pulmonary Rehab  Date  01/27/19  Educator  [facility]  Instruction Review Code  1- Verbalizes Understanding      Diabetes: - Individual verbal and written instruction to review signs/symptoms of diabetes, desired ranges of glucose level fasting, after meals and with exercise. Acknowledge that pre and post exercise glucose checks will be done for 3 sessions at entry of program.   Cardiac Rehab from 01/27/2019 in [facility] Cardiac and Pulmonary Rehab  Date  01/23/19  Educator  [facility]  Instruction Review Code  1- Verbalizes Understanding      Know Your Numbers and Risk Factors: -Group verbal and written instruction about important numbers in your health.  Discussion of what are risk factors and how they play a role in the disease process.  Review of  Cholesterol, Blood Pressure, Diabetes, and BMI and the role they play in your overall health.   Sleep Hygiene: -Provides group verbal and written instruction about how sleep can affect your health.  Define sleep hygiene, discuss sleep cycles and impact of sleep habits. Review good sleep hygiene tips.    Other: -Provides group and verbal instruction on various topics (see comments)   Knowledge Questionnaire Score:   Core Components/Risk Factors/Patient Goals at Admission: Personal Goals and Risk Factors at Admission - 01/27/19 1618      Core Components/Risk Factors/Patient Goals on Admission    Weight Management  Yes;Obesity;Weight Loss    Intervention  Weight Management: Develop a combined nutrition and exercise program designed to reach desired caloric intake, while maintaining appropriate intake of nutrient and fiber, sodium and fats, and appropriate energy expenditure required for the weight goal.;Weight Management: Provide education and appropriate resources to help participant work on and attain dietary goals.;Weight Management/Obesity: Establish reasonable short term and long term weight goals.;Obesity: Provide education and appropriate resources to help participant work on and attain dietary goals.    Admit Weight  198 lb 6.4 oz (90 kg)    Goal Weight: Short Term  193 lb (87.5 kg)    Goal Weight: Long Term  188 lb (85.3 kg)    Expected Outcomes  Short Term: Continue to assess and modify interventions until short term weight is achieved;Long Term: Adherence to nutrition and physical activity/exercise program aimed toward attainment of established weight goal;Weight Loss: Understanding of general recommendations for a balanced deficit meal plan, which promotes 1-2 lb weight loss per week and includes a negative energy balance of [phone] kcal/d;Understanding recommendations for meals to include 15-35% energy as protein, 25-35% energy from fat, 35-60% energy from carbohydrates, less than  '200mg'$  of dietary cholesterol, 20-35 gm of total fiber daily;Understanding of distribution of calorie intake throughout the day with the consumption of 4-5 meals/snacks    Diabetes  Yes    Intervention  Provide education about signs/symptoms and action to take for hypo/hyperglycemia.;Provide education about proper nutrition, including hydration,  and aerobic/resistive exercise prescription along with prescribed medications to achieve blood glucose in normal ranges: Fasting glucose 65-99 mg/dL    Expected Outcomes  Short Term: Participant verbalizes understanding of the signs/symptoms and immediate care of hyper/hypoglycemia, proper foot care and importance of medication, aerobic/resistive exercise and nutrition plan for blood glucose control.;Long Term: Attainment of HbA1C < 7%.    Hypertension  Yes    Intervention  Provide education on lifestyle modifcations including regular physical activity/exercise, weight management, moderate sodium restriction and increased consumption of fresh fruit, vegetables, and low fat dairy, alcohol moderation, and smoking cessation.;Monitor prescription use compliance.    Expected Outcomes  Short Term: Continued assessment and intervention until BP is < 140/40m HG in hypertensive participants. < 130/840mHG in hypertensive participants with diabetes, heart failure or chronic kidney disease.;Long Term: Maintenance of blood pressure at goal levels.    Lipids  Yes    Intervention  Provide education and support for participant on nutrition & aerobic/resistive exercise along with prescribed medications to achieve LDL '70mg'$ , HDL >'40mg'$ .    Expected Outcomes  Short Term: Participant states understanding of desired cholesterol values and is compliant with medications prescribed. Participant is following exercise prescription and nutrition guidelines.;Long Term: Cholesterol controlled with medications as prescribed, with individualized exercise RX and with personalized nutrition plan.  Value goals: LDL < '70mg'$ , HDL > 40 mg.       Core Components/Risk Factors/Patient Goals Review:    Core Components/Risk Factors/Patient Goals at Discharge (Final Review):    ITP Comments: ITP Comments    Row Name 01/23/19 1139 01/27/19 1547         ITP Comments  Virtual Orientation with RN completed. [facility] EP/RD orientation scheduled 8/10 at 2  Completed 6MWT, gym orientation, and RD evaluation. Initial ITP created and sent for review to Dr. MaEmily FilbertMedical Director.         Comments: Initial ITP

## 2019-01-27 NOTE — Patient Instructions (Signed)
Patient Instructions  Patient Details  Name: Brandi [name] MRN: [id number] Date of Birth: 1935/09/16 Referring Provider:  Teodoro Spray, MD  Below are your personal goals for exercise, nutrition, and risk factors. Our goal is to help you stay on track towards obtaining and maintaining these goals. We will be discussing your progress on these goals with you throughout the program.  Initial Exercise Prescription: Initial Exercise Prescription - 01/27/19 1600      Date of Initial Exercise RX and Referring Provider   Date  01/27/19    Referring Provider  Bartholome Bill MD      Recumbant Bike   Level  1    RPM  50    Watts  5    Minutes  15    METs  1.1      NuStep   Level  1    SPM  80    Minutes  15    METs  1      Arm Ergometer   Level  1    RPM  25    Minutes  15    METs  1      Recumbant Elliptical   Level  1    RPM  50    Minutes  15    METs  1      REL-XR   Level  1    Speed  50    Minutes  15    METs  1.2      T5 Nustep   Level  1    SPM  80    Minutes  15    METs  1      Biostep-RELP   Level  1    SPM  50    Minutes  15    METs  1      Prescription Details   Frequency (times per week)  3    Duration  Progress to 30 minutes of continuous aerobic without signs/symptoms of physical distress      Intensity   THRR 40-80% of Max Heartrate  105-128    Ratings of Perceived Exertion  11-13    Perceived Dyspnea  0-4      Progression   Progression  Continue to progress workloads to maintain intensity without signs/symptoms of physical distress.      Resistance Training   Training Prescription  Yes    Weight  2 lbs    Reps  10-15       Exercise Goals: Frequency: Be able to perform aerobic exercise two to three times per week in program working toward 2-5 days per week of home exercise.  Intensity: Work with a perceived exertion of 11 (fairly light) - 15 (hard) while following your exercise prescription.  We will make changes to your  prescription with you as you progress through the program.   Duration: Be able to do 30 to 45 minutes of continuous aerobic exercise in addition to a 5 minute warm-up and a 5 minute cool-down routine.   Nutrition Goals: Your personal nutrition goals will be established when you do your nutrition analysis with the dietician.  The following are general nutrition guidelines to follow: Cholesterol < 200mg /day Sodium < 1500mg /day Fiber: Women over 50 yrs - 21 grams per day  Personal Goals: Personal Goals and Risk Factors at Admission - 01/27/19 1618      Core Components/Risk Factors/Patient Goals on Admission    Weight Management  Yes;Obesity;Weight Loss  Intervention  Weight Management: Develop a combined nutrition and exercise program designed to reach desired caloric intake, while maintaining appropriate intake of nutrient and fiber, sodium and fats, and appropriate energy expenditure required for the weight goal.;Weight Management: Provide education and appropriate resources to help participant work on and attain dietary goals.;Weight Management/Obesity: Establish reasonable short term and long term weight goals.;Obesity: Provide education and appropriate resources to help participant work on and attain dietary goals.    Admit Weight  198 lb 6.4 oz (90 kg)    Goal Weight: Short Term  193 lb (87.5 kg)    Goal Weight: Long Term  188 lb (85.3 kg)    Expected Outcomes  Short Term: Continue to assess and modify interventions until short term weight is achieved;Long Term: Adherence to nutrition and physical activity/exercise program aimed toward attainment of established weight goal;Weight Loss: Understanding of general recommendations for a balanced deficit meal plan, which promotes 1-2 lb weight loss per week and includes a negative energy balance of [phone] kcal/d;Understanding recommendations for meals to include 15-35% energy as protein, 25-35% energy from fat, 35-60% energy from carbohydrates,  less than 200mg  of dietary cholesterol, 20-35 gm of total fiber daily;Understanding of distribution of calorie intake throughout the day with the consumption of 4-5 meals/snacks    Diabetes  Yes    Intervention  Provide education about signs/symptoms and action to take for hypo/hyperglycemia.;Provide education about proper nutrition, including hydration, and aerobic/resistive exercise prescription along with prescribed medications to achieve blood glucose in normal ranges: Fasting glucose 65-99 mg/dL    Expected Outcomes  Short Term: Participant verbalizes understanding of the signs/symptoms and immediate care of hyper/hypoglycemia, proper foot care and importance of medication, aerobic/resistive exercise and nutrition plan for blood glucose control.;Long Term: Attainment of HbA1C < 7%.    Hypertension  Yes    Intervention  Provide education on lifestyle modifcations including regular physical activity/exercise, weight management, moderate sodium restriction and increased consumption of fresh fruit, vegetables, and low fat dairy, alcohol moderation, and smoking cessation.;Monitor prescription use compliance.    Expected Outcomes  Short Term: Continued assessment and intervention until BP is < 140/19mm HG in hypertensive participants. < 130/36mm HG in hypertensive participants with diabetes, heart failure or chronic kidney disease.;Long Term: Maintenance of blood pressure at goal levels.    Lipids  Yes    Intervention  Provide education and support for participant on nutrition & aerobic/resistive exercise along with prescribed medications to achieve LDL 70mg , HDL >40mg .    Expected Outcomes  Short Term: Participant states understanding of desired cholesterol values and is compliant with medications prescribed. Participant is following exercise prescription and nutrition guidelines.;Long Term: Cholesterol controlled with medications as prescribed, with individualized exercise RX and with personalized  nutrition plan. Value goals: LDL < 70mg , HDL > 40 mg.       Tobacco Use Initial Evaluation: Social History   Tobacco Use  Smoking Status Never Smoker  Smokeless Tobacco Never Used    Exercise Goals and Review: Exercise Goals    Row Name 01/27/19 1617             Exercise Goals   Increase Physical Activity  Yes       Intervention  Provide advice, education, support and counseling about physical activity/exercise needs.;Develop an individualized exercise prescription for aerobic and resistive training based on initial evaluation findings, risk stratification, comorbidities and participant's personal goals.       Expected Outcomes  Short Term: Attend rehab on a regular basis to  increase amount of physical activity.;Long Term: Add in home exercise to make exercise part of routine and to increase amount of physical activity.;Long Term: Exercising regularly at least 3-5 days a week.       Increase Strength and Stamina  Yes       Intervention  Provide advice, education, support and counseling about physical activity/exercise needs.;Develop an individualized exercise prescription for aerobic and resistive training based on initial evaluation findings, risk stratification, comorbidities and participant's personal goals.       Expected Outcomes  Short Term: Increase workloads from initial exercise prescription for resistance, speed, and METs.;Short Term: Perform resistance training exercises routinely during rehab and add in resistance training at home;Long Term: Improve cardiorespiratory fitness, muscular endurance and strength as measured by increased METs and functional capacity (6MWT)       Able to understand and use rate of perceived exertion (RPE) scale  Yes       Intervention  Provide education and explanation on how to use RPE scale       Expected Outcomes  Long Term:  Able to use RPE to guide intensity level when exercising independently;Short Term: Able to use RPE daily in rehab to  express subjective intensity level       Able to understand and use Dyspnea scale  Yes       Intervention  Provide education and explanation on how to use Dyspnea scale       Expected Outcomes  Short Term: Able to use Dyspnea scale daily in rehab to express subjective sense of shortness of breath during exertion;Long Term: Able to use Dyspnea scale to guide intensity level when exercising independently       Knowledge and understanding of Target Heart Rate Range (THRR)  Yes       Intervention  Provide education and explanation of THRR including how the numbers were predicted and where they are located for reference       Expected Outcomes  Short Term: Able to state/look up THRR;Short Term: Able to use daily as guideline for intensity in rehab;Long Term: Able to use THRR to govern intensity when exercising independently       Able to check pulse independently  Yes       Intervention  Provide education and demonstration on how to check pulse in carotid and radial arteries.;Review the importance of being able to check your own pulse for safety during independent exercise       Expected Outcomes  Short Term: Able to explain why pulse checking is important during independent exercise;Long Term: Able to check pulse independently and accurately       Understanding of Exercise Prescription  Yes       Intervention  Provide education, explanation, and written materials on patient's individual exercise prescription       Expected Outcomes  Short Term: Able to explain program exercise prescription;Long Term: Able to explain home exercise prescription to exercise independently          Copy of goals given to participant.

## 2019-01-28 ENCOUNTER — Encounter: Payer: Medicare Other | Admitting: *Deleted

## 2019-01-28 DIAGNOSIS — Z951 Presence of aortocoronary bypass graft: Secondary | ICD-10-CM | POA: Diagnosis not present

## 2019-01-28 LAB — GLUCOSE, CAPILLARY
Glucose-Capillary: 121 mg/dL — ABNORMAL HIGH (ref 70–99)
Glucose-Capillary: 185 mg/dL — ABNORMAL HIGH (ref 70–99)

## 2019-01-28 NOTE — Progress Notes (Signed)
Daily Session Note  Patient Details  Name: Brandi [name] MRN: [id number] Date of Birth: Apr 18, 1936 Referring Provider:     Cardiac Rehab from 01/27/2019 in [facility]  Referring Provider  Bartholome Bill MD      Encounter Date: 01/28/2019  Check In: Session Check In - 01/28/19 1025      Check-In   Supervising physician immediately available to respond to emergencies  See telemetry face sheet for immediately available ER MD    Location  [facility]    Staff Present  Justin Mend RCP,RRT,BSRT;Charie Pinkus [place], MA, RCEP, CCRP, CCET;Susanne Bice, RN, BSN, [place], BA, ACSM CEP, Exercise Physiologist    Virtual Visit  No    Medication changes reported      No    Fall or balance concerns reported     No    Warm-up and Cool-down  Performed on first and last piece of equipment    Resistance Training Performed  Yes    VAD Patient?  No    PAD/SET Patient?  No      Pain Assessment   Currently in Pain?  No/denies          Social History   Tobacco Use  Smoking Status Never Smoker  Smokeless Tobacco Never Used    Goals Met:  Exercise tolerated well Personal goals reviewed No report of cardiac concerns or symptoms Strength training completed today  Goals Unmet:  Not Applicable  Comments: First full day of exercise!  Patient was oriented to gym and equipment including functions, settings, policies, and procedures.  Patient's individual exercise prescription and treatment plan were reviewed.  All starting workloads were established based on the results of the 6 minute walk test done at initial orientation visit.  The plan for exercise progression was also introduced and progression will be customized based on patient's performance and goals.    Dr. Emily Filbert is Medical Director for [facility] and [facility].

## 2019-01-29 ENCOUNTER — Encounter: Payer: Self-pay | Admitting: *Deleted

## 2019-01-29 DIAGNOSIS — Z951 Presence of aortocoronary bypass graft: Secondary | ICD-10-CM

## 2019-01-29 NOTE — Progress Notes (Signed)
Cardiac Individual Treatment Plan  Patient Details  Name: Brandi [name] MRN: [id number] Date of Birth: [birth year] Referring Provider:     Cardiac Rehab from 01/27/2019 in [facility] Cardiac and Pulmonary Rehab  Referring Provider  Bartholome Bill MD      Initial Encounter Date:    Cardiac Rehab from 01/27/2019 in [facility] Cardiac and Pulmonary Rehab  Date  01/27/19      Visit Diagnosis: S/P CABG x 3  Patient's Home Medications on Admission:  Current Outpatient Medications:  .  acetaminophen (TYLENOL) 500 MG tablet, Take by mouth., Disp: , Rfl:  .  albuterol (VENTOLIN HFA) 108 (90 Base) MCG/ACT inhaler, , Disp: , Rfl:  .  aspirin EC 81 MG tablet, Take 81 mg by mouth daily., Disp: , Rfl:  .  atorvastatin (LIPITOR) 40 MG tablet, Take by mouth., Disp: , Rfl:  .  budesonide-formoterol (SYMBICORT) 80-4.5 MCG/ACT inhaler, Inhale 2 puffs into the lungs 2 (two) times daily., Disp: , Rfl:  .  Calcium Carb-Cholecalciferol (CALCIUM + D3 PO), Take 1 tablet by mouth daily., Disp: , Rfl:  .  carboxymethylcellulose (REFRESH TEARS) 0.5 % SOLN, Apply 1-2 drops to eye at bedtime. , Disp: , Rfl:  .  carvedilol (COREG) 6.25 MG tablet, Take 6.25 mg by mouth 2 (two) times daily with a meal. , Disp: , Rfl:  .  clopidogrel (PLAVIX) 75 MG tablet, Take 75 mg by mouth daily., Disp: , Rfl:  .  conjugated estrogens (PREMARIN) vaginal cream, Place 1 Applicatorful vaginally daily. Apply 0.43m (pea-sized amount)  just inside the vaginal introitus with a finger-tip every night for two weeks and then Monday, Wednesday and Friday nights. (Patient not taking: Reported on 01/23/2019), Disp: 30 g, Rfl: 12 .  Cyanocobalamin (VITAMIN B-12 PO), Take 1 tablet by mouth daily., Disp: , Rfl:  .  gabapentin (NEURONTIN) 100 MG capsule, , Disp: , Rfl:  .  glipiZIDE (GLUCOTROL) 5 MG tablet, Take 5 mg by mouth 2 (two) times daily. , Disp: , Rfl:  .  hydrochlorothiazide (HYDRODIURIL) 12.5 MG tablet, , Disp: , Rfl: 1 .  Insulin Glargine (LANTUS  SOLOSTAR) 100 UNIT/ML Solostar Pen, Inject 12 Units into the skin at bedtime. , Disp: , Rfl:  .  JANUMET XR 50-1000 MG TB24, Take 1 tablet by mouth 2 (two) times daily., Disp: , Rfl:  .  losartan (COZAAR) 100 MG tablet, Take 100 mg by mouth daily., Disp: , Rfl:  .  meloxicam (MOBIC) 15 MG tablet, Take 15 mg by mouth daily., Disp: , Rfl:  .  mirabegron ER (MYRBETRIQ) 25 MG TB24 tablet, Take 1 tablet (25 mg total) by mouth daily., Disp: 30 tablet, Rfl: 3 .  Multiple Vitamins-Minerals (PRESERVISION/LUTEIN) CAPS, Take 1 capsule by mouth 2 (two) times daily. , Disp: , Rfl:  .  pantoprazole (PROTONIX) 40 MG tablet, Take 40 mg by mouth daily., Disp: , Rfl:  .  prasugrel (EFFIENT) 10 MG TABS tablet, Take 10 mg by mouth daily., Disp: , Rfl:  .  pregabalin (LYRICA) 75 MG capsule, Take 75 mg by mouth 2 (two) times daily., Disp: , Rfl:  .  senna-docusate (SENOKOT-S) 8.6-50 MG tablet, Take 1 tablet by mouth at bedtime as needed for mild constipation., Disp: 10 tablet, Rfl: 0 .  simvastatin (ZOCOR) 20 MG tablet, Take 20 mg by mouth daily at 6 PM. , Disp: , Rfl:  .  SitaGLIPtin-MetFORMIN HCl (JANUMET XR) 50-1000 MG TB24, Take 1 tablet by mouth 2 (two) times daily., Disp: , Rfl:  .  sodium chloride (ALTAMIST SPRAY) 0.65 % nasal spray, Place into the nose., Disp: , Rfl:  .  traZODone (DESYREL) 50 MG tablet, , Disp: , Rfl:  .  venlafaxine (EFFEXOR) 37.5 MG tablet, , Disp: , Rfl:  .  Verapamil HCl CR 300 MG CP24, Take 1 capsule by mouth daily., Disp: , Rfl:   Past Medical History: Past Medical History:  Diagnosis Date  . Anemia   . Anxiety   . Arthritis, degenerative   . Chronic cystitis   . Depression   . Diabetes mellitus without complication ([place])   . GERD (gastroesophageal reflux disease)   . Headache   . Heart disease   . HLD (hyperlipidemia)   . HTN (hypertension)   . Hypertension   . Incontinence in female   . Murmur   . Obesity   . Sleep apnea   . Urge incontinence of urine     Tobacco  Use: Social History   Tobacco Use  Smoking Status Never Smoker  Smokeless Tobacco Never Used    Labs: Recent Review Flowsheet Data    Labs for ITP Cardiac and Pulmonary Rehab Latest Ref Rng & Units 06/30/2011 04/16/2016   Cholestrol 0 - 200 mg/dL 141 -   LDLCALC 0 - 100 mg/dL 64 -   HDL 40 - 60 mg/dL 63(H) -   Trlycerides 0 - 200 mg/dL 71 -   Hemoglobin A1c 4.8 - 5.6 % 7.7(H) 7.3(H)       Exercise Target Goals: Exercise Program Goal: Individual exercise prescription set using results from initial 6 min walk test and THRR while considering  patient's activity barriers and safety.   Exercise Prescription Goal: Initial exercise prescription builds to 30-45 minutes a day of aerobic activity, 2-3 days per week.  Home exercise guidelines will be given to patient during program as part of exercise prescription that the participant will acknowledge.  Activity Barriers & Risk Stratification: Activity Barriers & Cardiac Risk Stratification - 01/27/19 1613      Activity Barriers & Cardiac Risk Stratification   Activity Barriers  Back Problems;Arthritis;Joint Problems;Deconditioning;Balance Concerns;Muscular Weakness;Shortness of Breath;Assistive Device;History of Falls    Cardiac Risk Stratification  High       6 Minute Walk: 6 Minute Walk    Row Name 01/27/19 1612         6 Minute Walk   Phase  Initial     Distance  620 feet     Walk Time  5.5 minutes     # of Rest Breaks  0     MPH  1.28     METS  1.15     RPE  17     Perceived Dyspnea   2     VO2 Peak  4.02     Symptoms  Yes (comment)     Comments  winded, fatigue     Resting HR  81 bpm     Resting BP  142/74     Resting Oxygen Saturation   97 %     Exercise Oxygen Saturation  during 6 min walk  96 %     Max Ex. HR  134 bpm     Max Ex. BP  156/76     2 Minute Post BP  142/70        Oxygen Initial Assessment:   Oxygen Re-Evaluation:   Oxygen Discharge (Final Oxygen Re-Evaluation):   Initial Exercise  Prescription: Initial Exercise Prescription - 01/27/19 1600      Date of  Initial Exercise RX and Referring Provider   Date  01/27/19    Referring Provider  Bartholome Bill MD      Recumbant Bike   Level  1    RPM  50    Watts  5    Minutes  15    METs  1.1      NuStep   Level  1    SPM  80    Minutes  15    METs  1      Arm Ergometer   Level  1    RPM  25    Minutes  15    METs  1      Recumbant Elliptical   Level  1    RPM  50    Minutes  15    METs  1      REL-XR   Level  1    Speed  50    Minutes  15    METs  1.2      T5 Nustep   Level  1    SPM  80    Minutes  15    METs  1      Biostep-RELP   Level  1    SPM  50    Minutes  15    METs  1      Prescription Details   Frequency (times per week)  3    Duration  Progress to 30 minutes of continuous aerobic without signs/symptoms of physical distress      Intensity   THRR 40-80% of Max Heartrate  105-128    Ratings of Perceived Exertion  11-13    Perceived Dyspnea  0-4      Progression   Progression  Continue to progress workloads to maintain intensity without signs/symptoms of physical distress.      Resistance Training   Training Prescription  Yes    Weight  2 lbs    Reps  10-15       Perform Capillary Blood Glucose checks as needed.  Exercise Prescription Changes: Exercise Prescription Changes    Row Name 01/27/19 1600             Response to Exercise   Blood Pressure (Admit)  142/74       Blood Pressure (Exercise)  156/76       Blood Pressure (Exit)  142/70       Heart Rate (Admit)  81 bpm       Heart Rate (Exercise)  134 bpm       Heart Rate (Exit)  83 bpm       Oxygen Saturation (Admit)  97 %       Oxygen Saturation (Exercise)  96 %       Rating of Perceived Exertion (Exercise)  17       Perceived Dyspnea (Exercise)  2       Symptoms  winded, fatigue       Comments  walk test results          Exercise Comments: Exercise Comments    Row Name 01/28/19 1026            Exercise Comments  First full day of exercise!  Patient was oriented to gym and equipment including functions, settings, policies, and procedures.  Patient's individual exercise prescription and treatment plan were reviewed.  All starting workloads were established based on the results of the 6 minute walk test done  at initial orientation visit.  The plan for exercise progression was also introduced and progression will be customized based on patient's performance and goals.          Exercise Goals and Review: Exercise Goals    Row Name 01/27/19 1617             Exercise Goals   Increase Physical Activity  Yes       Intervention  Provide advice, education, support and counseling about physical activity/exercise needs.;Develop an individualized exercise prescription for aerobic and resistive training based on initial evaluation findings, risk stratification, comorbidities and participant's personal goals.       Expected Outcomes  Short Term: Attend rehab on a regular basis to increase amount of physical activity.;Long Term: Add in home exercise to make exercise part of routine and to increase amount of physical activity.;Long Term: Exercising regularly at least 3-5 days a week.       Increase Strength and Stamina  Yes       Intervention  Provide advice, education, support and counseling about physical activity/exercise needs.;Develop an individualized exercise prescription for aerobic and resistive training based on initial evaluation findings, risk stratification, comorbidities and participant's personal goals.       Expected Outcomes  Short Term: Increase workloads from initial exercise prescription for resistance, speed, and METs.;Short Term: Perform resistance training exercises routinely during rehab and add in resistance training at home;Long Term: Improve cardiorespiratory fitness, muscular endurance and strength as measured by increased METs and functional capacity (6MWT)       Able to  understand and use rate of perceived exertion (RPE) scale  Yes       Intervention  Provide education and explanation on how to use RPE scale       Expected Outcomes  Long Term:  Able to use RPE to guide intensity level when exercising independently;Short Term: Able to use RPE daily in rehab to express subjective intensity level       Able to understand and use Dyspnea scale  Yes       Intervention  Provide education and explanation on how to use Dyspnea scale       Expected Outcomes  Short Term: Able to use Dyspnea scale daily in rehab to express subjective sense of shortness of breath during exertion;Long Term: Able to use Dyspnea scale to guide intensity level when exercising independently       Knowledge and understanding of Target Heart Rate Range (THRR)  Yes       Intervention  Provide education and explanation of THRR including how the numbers were predicted and where they are located for reference       Expected Outcomes  Short Term: Able to state/look up THRR;Short Term: Able to use daily as guideline for intensity in rehab;Long Term: Able to use THRR to govern intensity when exercising independently       Able to check pulse independently  Yes       Intervention  Provide education and demonstration on how to check pulse in carotid and radial arteries.;Review the importance of being able to check your own pulse for safety during independent exercise       Expected Outcomes  Short Term: Able to explain why pulse checking is important during independent exercise;Long Term: Able to check pulse independently and accurately       Understanding of Exercise Prescription  Yes       Intervention  Provide education, explanation, and written materials on patient's  individual exercise prescription       Expected Outcomes  Short Term: Able to explain program exercise prescription;Long Term: Able to explain home exercise prescription to exercise independently          Exercise Goals Re-Evaluation  : Exercise Goals Re-Evaluation    Row Name 01/28/19 1028             Exercise Goal Re-Evaluation   Exercise Goals Review  Increase Physical Activity;Increase Strength and Stamina;Able to understand and use rate of perceived exertion (RPE) scale;Able to understand and use Dyspnea scale;Knowledge and understanding of Target Heart Rate Range (THRR);Understanding of Exercise Prescription       Comments  Reviewed RPE scale, THR and program prescription with pt today.  Pt voiced understanding and was given a copy of goals to take home.       Expected Outcomes  Short: Use RPE daily to regulate intensity. Long: Follow program prescription in THR.          Discharge Exercise Prescription (Final Exercise Prescription Changes): Exercise Prescription Changes - 01/27/19 1600      Response to Exercise   Blood Pressure (Admit)  142/74    Blood Pressure (Exercise)  156/76    Blood Pressure (Exit)  142/70    Heart Rate (Admit)  81 bpm    Heart Rate (Exercise)  134 bpm    Heart Rate (Exit)  83 bpm    Oxygen Saturation (Admit)  97 %    Oxygen Saturation (Exercise)  96 %    Rating of Perceived Exertion (Exercise)  17    Perceived Dyspnea (Exercise)  2    Symptoms  winded, fatigue    Comments  walk test results       Nutrition:  Target Goals: Understanding of nutrition guidelines, daily intake of sodium <1540m, cholesterol <2030m calories 30% from fat and 7% or less from saturated fats, daily to have 5 or more servings of fruits and vegetables.  Biometrics: Pre Biometrics - 01/27/19 1617      Pre Biometrics   Height  _0  (1.549 m)    Weight  198 lb 6.4 oz (90 kg)    BMI (Calculated)  37.51    Single Leg Stand  0.37 seconds        Nutrition Therapy Plan and Nutrition Goals: Nutrition Therapy & Goals - 01/27/19 1526      Nutrition Therapy   Diet  Low Na, HH, DM diet    Drug/Food Interactions  Statins/Certain Fruits   lipitor   Protein (specify units)  75g    Fiber  25 grams     Whole Grain Foods  3 servings    Saturated Fats  12 max. grams    Fruits and Vegetables  5 servings/day    Sodium  1.5 grams      Personal Nutrition Goals   Nutrition Goal  ST: increase vegetable intake (+1 serving 3x/week) LT: walk again and get rid of cane.    Comments  Lantus, janumet, glipizidesenokot, coregCa + D3 A1c 10/2018 was 6.7 (WNL). Pt reports for B she eats grits, eggos, special K or cinnamon cheerios with whole milk. L tomato sandwich with full fat mayo and white bread. Dinner is chicken with the skin and/or bacon, baked pork chops and instant potatoes or white pasta. Pt will use margarine, high fat protein, whole fat dairy, fried foods at least 2x/week, minimal F and V. Pt suggested she needed to eat more vegetables, but would still like to  review the content. Discussed HH DM eating.      Intervention Plan   Intervention  Prescribe, educate and counsel regarding individualized specific dietary modifications aiming towards targeted core components such as weight, hypertension, lipid management, diabetes, heart failure and other comorbidities.;Nutrition handout(s) given to patient.    Expected Outcomes  Short Term Goal: Understand basic principles of dietary content, such as calories, fat, sodium, cholesterol and nutrients.;Long Term Goal: Adherence to prescribed nutrition plan.;Short Term Goal: A plan has been developed with personal nutrition goals set during dietitian appointment.       Nutrition Assessments: Nutrition Assessments - 01/27/19 1542      MEDFICTS Scores   Pre Score  100       Nutrition Goals Re-Evaluation:   Nutrition Goals Discharge (Final Nutrition Goals Re-Evaluation):   Psychosocial: Target Goals: Acknowledge presence or absence of significant depression and/or stress, maximize coping skills, provide positive support system. Participant is able to verbalize types and ability to use techniques and skills needed for reducing stress and depression.    Initial Review & Psychosocial Screening: Initial Psych Review & Screening - 01/23/19 1151      Initial Review   Current issues with  Current Sleep Concerns;Current Stress Concerns    Comments  Anorah is trying to regulate her sleep still from her admission in December. She is taking trazodone, but is still drowsy most of the morning so she is trying to slowly come off of it. She reported her only source of stress right now is Covid. She lives with 2 of her children, so they have been making sure she has everything she needs. She has not been out much since March.      Family Dynamics   Good Support System?  Yes      Barriers   Psychosocial barriers to participate in program  There are no identifiable barriers or psychosocial needs.;The patient should benefit from training in stress management and relaxation.      Screening Interventions   Interventions  Encouraged to exercise;Provide feedback about the scores to participant    Expected Outcomes  Short Term goal: Utilizing psychosocial counselor, staff and physician to assist with identification of specific Stressors or current issues interfering with healing process. Setting desired goal for each stressor or current issue identified.;Long Term Goal: Stressors or current issues are controlled or eliminated.;Short Term goal: Identification and review with participant of any Quality of Life or Depression concerns found by scoring the questionnaire.;Long Term goal: The participant improves quality of Life and PHQ9 Scores as seen by post scores and/or verbalization of changes       Quality of Life Scores:  Quality of Life - 01/27/19 1618      Quality of Life   Select  Quality of Life      Quality of Life Scores   Health/Function Pre  17.58 %    Socioeconomic Pre  20.86 %    Psych/Spiritual Pre  20 %    Family Pre  23.63 %    GLOBAL Pre  19.72 %      Scores of 19 and below usually indicate a poorer quality of life in these areas.  A  difference of  2-3 points is a clinically meaningful difference.  A difference of 2-3 points in the total score of the Quality of Life Index has been associated with significant improvement in overall quality of life, self-image, physical symptoms, and general health in studies assessing change in quality of life.  PHQ-9:  Recent Review Flowsheet Data    Depression screen [facility] 2/9 01/27/2019   Decreased Interest 1   Down, Depressed, Hopeless 1   PHQ - 2 Score 2   Altered sleeping 2   Tired, decreased energy 1   Change in appetite 2   Feeling bad or failure about yourself  1   Trouble concentrating 0   Moving slowly or fidgety/restless 0   Suicidal thoughts 0   PHQ-9 Score 8   Difficult doing work/chores Somewhat difficult     Interpretation of Total Score  Total Score Depression Severity:  1-4 = Minimal depression, 5-9 = Mild depression, 10-14 = Moderate depression, 15-19 = Moderately severe depression, 20-27 = Severe depression   Psychosocial Evaluation and Intervention:   Psychosocial Re-Evaluation:   Psychosocial Discharge (Final Psychosocial Re-Evaluation):   Vocational Rehabilitation: Provide vocational rehab assistance to qualifying candidates.   Vocational Rehab Evaluation & Intervention: Vocational Rehab - 01/23/19 1143      Initial Vocational Rehab Evaluation & Intervention   Assessment shows need for Vocational Rehabilitation  No       Education: Education Goals: Education classes will be provided on a variety of topics geared toward better understanding of heart health and risk factor modification. Participant will state understanding/return demonstration of topics presented as noted by education test scores.  Learning Barriers/Preferences: Learning Barriers/Preferences - 01/23/19 1143      Learning Barriers/Preferences   Learning Barriers  None    Learning Preferences  Individual Instruction       Education Topics:  AED/CPR: - Group verbal and  written instruction with the use of models to demonstrate the basic use of the AED with the basic ABC's of resuscitation.   General Nutrition Guidelines/Fats and Fiber: -Group instruction provided by verbal, written material, models and posters to present the general guidelines for heart healthy nutrition. Gives an explanation and review of dietary fats and fiber.   Controlling Sodium/Reading Food Labels: -Group verbal and written material supporting the discussion of sodium use in heart healthy nutrition. Review and explanation with models, verbal and written materials for utilization of the food label.   Exercise Physiology & General Exercise Guidelines: - Group verbal and written instruction with models to review the exercise physiology of the cardiovascular system and associated critical values. Provides general exercise guidelines with specific guidelines to those with heart or lung disease.    Aerobic Exercise & Resistance Training: - Gives group verbal and written instruction on the various components of exercise. Focuses on aerobic and resistive training programs and the benefits of this training and how to safely progress through these programs..   Flexibility, Balance, Mind/Body Relaxation: Provides group verbal/written instruction on the benefits of flexibility and balance training, including mind/body exercise modes such as yoga, pilates and tai chi.  Demonstration and skill practice provided.   Stress and Anxiety: - Provides group verbal and written instruction about the health risks of elevated stress and causes of high stress.  Discuss the correlation between heart/lung disease and anxiety and treatment options. Review healthy ways to manage with stress and anxiety.   Depression: - Provides group verbal and written instruction on the correlation between heart/lung disease and depressed mood, treatment options, and the stigmas associated with seeking treatment.   Anatomy  & Physiology of the Heart: - Group verbal and written instruction and models provide basic cardiac anatomy and physiology, with the coronary electrical and arterial systems. Review of Valvular disease and Heart Failure   Cardiac Procedures: - Group verbal  and written instruction to review commonly prescribed medications for heart disease. Reviews the medication, class of the drug, and side effects. Includes the steps to properly store meds and maintain the prescription regimen. (beta blockers and nitrates)   Cardiac Medications I: - Group verbal and written instruction to review commonly prescribed medications for heart disease. Reviews the medication, class of the drug, and side effects. Includes the steps to properly store meds and maintain the prescription regimen.   Cardiac Medications II: -Group verbal and written instruction to review commonly prescribed medications for heart disease. Reviews the medication, class of the drug, and side effects. (all other drug classes)    Go Sex-Intimacy & Heart Disease, Get SMART - Goal Setting: - Group verbal and written instruction through game format to discuss heart disease and the return to sexual intimacy. Provides group verbal and written material to discuss and apply goal setting through the application of the S.M.A.R.T. Method.   Other Matters of the Heart: - Provides group verbal, written materials and models to describe Stable Angina and Peripheral Artery. Includes description of the disease process and treatment options available to the cardiac patient.   Exercise & Equipment Safety: - Individual verbal instruction and demonstration of equipment use and safety with use of the equipment.   Cardiac Rehab from 01/27/2019 in [facility] Cardiac and Pulmonary Rehab  Date  01/27/19  Educator  [facility]  Instruction Review Code  1- Verbalizes Understanding      Infection Prevention: - Provides verbal and written material to individual with discussion  of infection control including proper hand washing and proper equipment cleaning during exercise session.   Cardiac Rehab from 01/27/2019 in [facility] Cardiac and Pulmonary Rehab  Date  01/27/19  Educator  [facility]  Instruction Review Code  1- Verbalizes Understanding      Falls Prevention: - Provides verbal and written material to individual with discussion of falls prevention and safety.   Cardiac Rehab from 01/27/2019 in [facility] Cardiac and Pulmonary Rehab  Date  01/27/19  Educator  [facility]  Instruction Review Code  1- Verbalizes Understanding      Diabetes: - Individual verbal and written instruction to review signs/symptoms of diabetes, desired ranges of glucose level fasting, after meals and with exercise. Acknowledge that pre and post exercise glucose checks will be done for 3 sessions at entry of program.   Cardiac Rehab from 01/27/2019 in [place] [facility] Cardiac and Pulmonary Rehab  Date  01/23/19  Educator  [facility]  Instruction Review Code  1- Verbalizes Understanding      Know Your Numbers and Risk Factors: -Group verbal and written instruction about important numbers in your health.  Discussion of what are risk factors and how they play a role in the disease process.  Review of Cholesterol, Blood Pressure, Diabetes, and BMI and the role they play in your overall health.   Sleep Hygiene: -Provides group verbal and written instruction about how sleep can affect your health.  Define sleep hygiene, discuss sleep cycles and impact of sleep habits. Review good sleep hygiene tips.    Other: -Provides group and verbal instruction on various topics (see comments)   Knowledge Questionnaire Score: Knowledge Questionnaire Score - 01/28/19 1026      Knowledge Questionnaire Score   Pre Score  21/26   test reviewed with pt today  Education Focus:  Angina, Heart Failure, PAD, Nutrition, Exercise      Core Components/Risk Factors/Patient Goals at Admission: Personal Goals and Risk Factors at Admission -  01/27/19 1618  Core Components/Risk Factors/Patient Goals on Admission    Weight Management  Yes;Obesity;Weight Loss    Intervention  Weight Management: Develop a combined nutrition and exercise program designed to reach desired caloric intake, while maintaining appropriate intake of nutrient and fiber, sodium and fats, and appropriate energy expenditure required for the weight goal.;Weight Management: Provide education and appropriate resources to help participant work on and attain dietary goals.;Weight Management/Obesity: Establish reasonable short term and long term weight goals.;Obesity: Provide education and appropriate resources to help participant work on and attain dietary goals.    Admit Weight  198 lb 6.4 oz (90 kg)    Goal Weight: Short Term  193 lb (87.5 kg)    Goal Weight: Long Term  188 lb (85.3 kg)    Expected Outcomes  Short Term: Continue to assess and modify interventions until short term weight is achieved;Long Term: Adherence to nutrition and physical activity/exercise program aimed toward attainment of established weight goal;Weight Loss: Understanding of general recommendations for a balanced deficit meal plan, which promotes 1-2 lb weight loss per week and includes a negative energy balance of [phone] kcal/d;Understanding recommendations for meals to include 15-35% energy as protein, 25-35% energy from fat, 35-60% energy from carbohydrates, less than 26m of dietary cholesterol, 20-35 gm of total fiber daily;Understanding of distribution of calorie intake throughout the day with the consumption of 4-5 meals/snacks    Diabetes  Yes    Intervention  Provide education about signs/symptoms and action to take for hypo/hyperglycemia.;Provide education about proper nutrition, including hydration, and aerobic/resistive exercise prescription along with prescribed medications to achieve blood glucose in normal ranges: Fasting glucose 65-99 mg/dL    Expected Outcomes  Short Term:  Participant verbalizes understanding of the signs/symptoms and immediate care of hyper/hypoglycemia, proper foot care and importance of medication, aerobic/resistive exercise and nutrition plan for blood glucose control.;Long Term: Attainment of HbA1C < 7%.    Hypertension  Yes    Intervention  Provide education on lifestyle modifcations including regular physical activity/exercise, weight management, moderate sodium restriction and increased consumption of fresh fruit, vegetables, and low fat dairy, alcohol moderation, and smoking cessation.;Monitor prescription use compliance.    Expected Outcomes  Short Term: Continued assessment and intervention until BP is < 140/995mHG in hypertensive participants. < 130/8051mG in hypertensive participants with diabetes, heart failure or chronic kidney disease.;Long Term: Maintenance of blood pressure at goal levels.    Lipids  Yes    Intervention  Provide education and support for participant on nutrition & aerobic/resistive exercise along with prescribed medications to achieve LDL <51m30mDL >40mg42m Expected Outcomes  Short Term: Participant states understanding of desired cholesterol values and is compliant with medications prescribed. Participant is following exercise prescription and nutrition guidelines.;Long Term: Cholesterol controlled with medications as prescribed, with individualized exercise RX and with personalized nutrition plan. Value goals: LDL < 51mg,28m > 40 mg.       Core Components/Risk Factors/Patient Goals Review:    Core Components/Risk Factors/Patient Goals at Discharge (Final Review):    ITP Comments: ITP Comments    Row Name 01/23/19 1139 01/27/19 1547 01/29/19 0635  3300 ITP Comments  Virtual Orientation with RN completed. [facility]. EP/RD orientation scheduled 8/10 at 2  Completed 6MWT, gym orientation, and RD evaluation. Initial ITP created and sent for review to Dr. Mark MEmily Filbertcal  Director.  30 Day Review Completed today. Continue with ITP unless changed by Medical Director review.  New start to program        Comments:

## 2019-01-30 ENCOUNTER — Encounter: Payer: Medicare Other | Admitting: *Deleted

## 2019-01-30 ENCOUNTER — Other Ambulatory Visit: Payer: Self-pay

## 2019-01-30 DIAGNOSIS — Z951 Presence of aortocoronary bypass graft: Secondary | ICD-10-CM | POA: Diagnosis not present

## 2019-01-30 LAB — GLUCOSE, CAPILLARY
Glucose-Capillary: 192 mg/dL — ABNORMAL HIGH (ref 70–99)
Glucose-Capillary: 152 mg/dL — ABNORMAL HIGH (ref 70–99)

## 2019-01-30 NOTE — Progress Notes (Signed)
Daily Session Note  Patient Details  Name: GENICE KIMBERLIN MRN: [id number] Date of Birth: 09-16-1935 Referring Provider:     Cardiac Rehab from 01/27/2019 in [facility] Cardiac and Pulmonary Rehab  Referring Provider  Bartholome Bill MD      Encounter Date: 01/30/2019  Check In: Session Check In - 01/30/19 1026      Check-In   Supervising physician immediately available to respond to emergencies  See telemetry face sheet for immediately available ER MD    Location  [facility]-Cardiac & Pulmonary Rehab    Staff Present  Renita Papa, RN Vickki Hearing, BA, ACSM CEP, Exercise Physiologist;Laureen Owens Shark, BS, RRT, CPFT    Virtual Visit  No    Medication changes reported      No    Fall or balance concerns reported     No    Warm-up and Cool-down  Performed on first and last piece of equipment    Resistance Training Performed  Yes    VAD Patient?  No    PAD/SET Patient?  No      Pain Assessment   Currently in Pain?  No/denies          Social History   Tobacco Use  Smoking Status Never Smoker  Smokeless Tobacco Never Used    Goals Met:  Independence with exercise equipment Exercise tolerated well No report of cardiac concerns or symptoms Strength training completed today  Goals Unmet:  Not Applicable  Comments: Pt able to follow exercise prescription today without complaint.  Will continue to monitor for progression.    Dr. Emily Filbert is Medical Director for [place] and [facility].

## 2019-01-31 ENCOUNTER — Encounter: Payer: Medicare Other | Admitting: *Deleted

## 2019-01-31 DIAGNOSIS — Z951 Presence of aortocoronary bypass graft: Secondary | ICD-10-CM

## 2019-01-31 LAB — GLUCOSE, CAPILLARY
Glucose-Capillary: 164 mg/dL — ABNORMAL HIGH (ref 70–99)
Glucose-Capillary: 181 mg/dL — ABNORMAL HIGH (ref 70–99)

## 2019-01-31 NOTE — Progress Notes (Signed)
Daily Session Note  Patient Details  Name: YESLY GERETY MRN: [id number] Date of Birth: 10-Apr-1936 Referring Provider:     Cardiac Rehab from 01/27/2019 in [facility] Cardiac and Pulmonary Rehab  Referring Provider  Bartholome Bill MD      Encounter Date: 01/31/2019  Check In: Session Check In - 01/31/19 1013      Check-In   Supervising physician immediately available to respond to emergencies  See telemetry face sheet for immediately available ER MD    Location  [facility]-Cardiac & Pulmonary Rehab    Staff Present  Renita Papa, RN Vickki Hearing, BA, ACSM CEP, Exercise Physiologist;Joseph Tessie Fass RCP,RRT,BSRT    Virtual Visit  No    Medication changes reported      No    Fall or balance concerns reported     No    Warm-up and Cool-down  Performed on first and last piece of equipment    Resistance Training Performed  Yes    VAD Patient?  No    PAD/SET Patient?  No      Pain Assessment   Currently in Pain?  No/denies          Social History   Tobacco Use  Smoking Status Never Smoker  Smokeless Tobacco Never Used    Goals Met:  Independence with exercise equipment Exercise tolerated well No report of cardiac concerns or symptoms Strength training completed today  Goals Unmet:  Not Applicable  Comments: Pt able to follow exercise prescription today without complaint.  Will continue to monitor for progression.    Dr. Emily Filbert is Medical Director for [place] and [facility].

## 2019-02-04 ENCOUNTER — Other Ambulatory Visit: Payer: Self-pay

## 2019-02-04 DIAGNOSIS — Z951 Presence of aortocoronary bypass graft: Secondary | ICD-10-CM | POA: Diagnosis not present

## 2019-02-04 NOTE — Progress Notes (Signed)
Daily Session Note  Patient Details  Name: Brandi [name] MRN: [id number] Date of Birth: 06-17-1936 Referring Provider:     Cardiac Rehab from 01/27/2019 in [facility] Cardiac and Pulmonary Rehab  Referring Provider  Bartholome Bill MD      Encounter Date: 02/04/2019  Check In:      Social History   Tobacco Use  Smoking Status Never Smoker  Smokeless Tobacco Never Used    Goals Met:  Independence with exercise equipment Exercise tolerated well No report of cardiac concerns or symptoms Strength training completed today  Goals Unmet:  Not Applicable  Comments: Pt able to follow exercise prescription today without complaint.  Will continue to monitor for progression.    Dr. Emily Filbert is Medical Director for [place] and [facility].

## 2019-02-07 ENCOUNTER — Other Ambulatory Visit: Payer: Self-pay

## 2019-02-07 ENCOUNTER — Encounter: Payer: Medicare Other | Admitting: *Deleted

## 2019-02-07 DIAGNOSIS — Z951 Presence of aortocoronary bypass graft: Secondary | ICD-10-CM

## 2019-02-07 NOTE — Progress Notes (Signed)
Daily Session Note  Patient Details  Name: Brandi [name] MRN: [id number] Date of Birth: 01-03-1936 Referring Provider:     Cardiac Rehab from 01/27/2019 in [facility] Cardiac and Pulmonary Rehab  Referring Provider  Brandi Bill MD      Encounter Date: 02/07/2019  Check In: Session Check In - 02/07/19 1016      Check-In   Supervising physician immediately available to respond to emergencies  See telemetry face sheet for immediately available ER MD    Location  [facility]-Cardiac & Pulmonary Rehab    Staff Present  Brandi Lark, RN, BSN, CCRP;Brandi Frostproof, MA, RCEP, CCRP, Brandi [name], Brandi [name], ACSM CEP, Exercise Physiologist    Virtual Visit  No    Medication changes reported      No    Fall or balance concerns reported     No    Warm-up and Cool-down  Performed on first and last piece of equipment    Resistance Training Performed  Yes    VAD Patient?  No    PAD/SET Patient?  No      Pain Assessment   Currently in Pain?  No/denies          Social History   Tobacco Use  Smoking Status Never Smoker  Smokeless Tobacco Never Used    Goals Met:  Exercise tolerated well Strength training completed today  Goals Unmet:  Not Applicable  Comments: Pt able to follow exercise prescription today without complaint.  Will continue to monitor for progression. Weight up 2 pounds- some SOB noted by Brandi [name].  Advised call MD/ED if gets worse   Dr. Emily [name] is Medical Director for [name] and [facility].

## 2019-02-11 ENCOUNTER — Encounter: Payer: Medicare Other | Admitting: *Deleted

## 2019-02-11 ENCOUNTER — Other Ambulatory Visit: Payer: Self-pay

## 2019-02-11 DIAGNOSIS — Z951 Presence of aortocoronary bypass graft: Secondary | ICD-10-CM

## 2019-02-11 NOTE — Progress Notes (Signed)
Daily Session Note  Patient Details  Name: Brandi [name] MRN: [id number] Date of Birth: 06/06/1936 Referring Provider:     Cardiac Rehab from 01/27/2019 in [facility] Cardiac and Pulmonary Rehab  Referring Provider  Bartholome Bill MD      Encounter Date: 02/11/2019  Check In: Session Check In - 02/11/19 1022      Check-In   Supervising physician immediately available to respond to emergencies  See telemetry face sheet for immediately available ER MD    Location  [facility]-Cardiac & Pulmonary Rehab    Staff Present  Nada Maclachlan, BA, ACSM CEP, Exercise Physiologist;Susanne Bice, RN, BSN, CCRP;Jeanna Durrell BS, Exercise Physiologist    Virtual Visit  No    Medication changes reported      No    Fall or balance concerns reported     No    Warm-up and Cool-down  Performed on first and last piece of equipment    Resistance Training Performed  Yes    VAD Patient?  No    PAD/SET Patient?  No      Pain Assessment   Currently in Pain?  No/denies          Social History   Tobacco Use  Smoking Status Never Smoker  Smokeless Tobacco Never Used    Goals Met:  Exercise tolerated well No report of cardiac concerns or symptoms Strength training completed today  Goals Unmet:  Not Applicable  Comments: Pt able to follow exercise prescription today without complaint.  Will continue to monitor for progression.    Dr. Emily Filbert is Medical Director for [place] and [facility].

## 2019-02-12 DIAGNOSIS — N183 Chronic kidney disease, stage 3 unspecified: Secondary | ICD-10-CM | POA: Insufficient documentation

## 2019-02-14 ENCOUNTER — Other Ambulatory Visit: Payer: Self-pay

## 2019-02-14 DIAGNOSIS — Z951 Presence of aortocoronary bypass graft: Secondary | ICD-10-CM

## 2019-02-14 NOTE — Progress Notes (Signed)
Daily Session Note  Patient Details  Name: WYONIA FONTANELLA MRN: [id number] Date of Birth: [birth year] Referring Provider:     Cardiac Rehab from 01/27/2019 in [facility] Cardiac and Pulmonary Rehab  Referring Provider  Bartholome Bill MD      Encounter Date: 02/14/2019  Check In: Session Check In - 02/14/19 1016      Check-In   Supervising physician immediately available to respond to emergencies  See telemetry face sheet for immediately available ER MD    Location  [facility]-Cardiac & Pulmonary Rehab    Staff Present  Vida Rigger RN, Vickki Hearing, BA, ACSM CEP, Exercise Physiologist;Jessica Luan Pulling, MA, RCEP, CCRP, CCET    Virtual Visit  No    Medication changes reported      No    Fall or balance concerns reported     No    Warm-up and Cool-down  Performed on first and last piece of equipment    Resistance Training Performed  Yes    VAD Patient?  No    PAD/SET Patient?  No      Pain Assessment   Currently in Pain?  No/denies    Multiple Pain Sites  No          Social History   Tobacco Use  Smoking Status Never Smoker  Smokeless Tobacco Never Used    Goals Met:  Independence with exercise equipment Exercise tolerated well No report of cardiac concerns or symptoms Strength training completed today  Goals Unmet:  Not Applicable  Comments: Reviewed home exercise with pt today.  Pt plans to use bike at home for exercise.  Reviewed THR, pulse, RPE, sign and symptoms, NTG use, and when to call 911 or MD.  Also discussed weather considerations and indoor options.  Pt voiced understanding.     Dr. Emily Filbert is Medical Director for [place] and [facility].

## 2019-02-18 ENCOUNTER — Encounter: Payer: Medicare Other | Attending: Cardiology

## 2019-02-18 ENCOUNTER — Other Ambulatory Visit: Payer: Self-pay

## 2019-02-18 DIAGNOSIS — Z7902 Long term (current) use of antithrombotics/antiplatelets: Secondary | ICD-10-CM | POA: Insufficient documentation

## 2019-02-18 DIAGNOSIS — E669 Obesity, unspecified: Secondary | ICD-10-CM | POA: Insufficient documentation

## 2019-02-18 DIAGNOSIS — Z794 Long term (current) use of insulin: Secondary | ICD-10-CM | POA: Insufficient documentation

## 2019-02-18 DIAGNOSIS — F419 Anxiety disorder, unspecified: Secondary | ICD-10-CM | POA: Insufficient documentation

## 2019-02-18 DIAGNOSIS — Z79899 Other long term (current) drug therapy: Secondary | ICD-10-CM | POA: Insufficient documentation

## 2019-02-18 DIAGNOSIS — Z7982 Long term (current) use of aspirin: Secondary | ICD-10-CM | POA: Insufficient documentation

## 2019-02-18 DIAGNOSIS — K219 Gastro-esophageal reflux disease without esophagitis: Secondary | ICD-10-CM | POA: Insufficient documentation

## 2019-02-18 DIAGNOSIS — Z791 Long term (current) use of non-steroidal anti-inflammatories (NSAID): Secondary | ICD-10-CM | POA: Insufficient documentation

## 2019-02-18 DIAGNOSIS — M1990 Unspecified osteoarthritis, unspecified site: Secondary | ICD-10-CM | POA: Insufficient documentation

## 2019-02-18 DIAGNOSIS — E785 Hyperlipidemia, unspecified: Secondary | ICD-10-CM | POA: Insufficient documentation

## 2019-02-18 DIAGNOSIS — E119 Type 2 diabetes mellitus without complications: Secondary | ICD-10-CM | POA: Insufficient documentation

## 2019-02-18 DIAGNOSIS — Z951 Presence of aortocoronary bypass graft: Secondary | ICD-10-CM | POA: Insufficient documentation

## 2019-02-18 DIAGNOSIS — G4730 Sleep apnea, unspecified: Secondary | ICD-10-CM | POA: Insufficient documentation

## 2019-02-18 DIAGNOSIS — F329 Major depressive disorder, single episode, unspecified: Secondary | ICD-10-CM | POA: Insufficient documentation

## 2019-02-18 NOTE — Progress Notes (Signed)
Incomplete Session Note  Patient Details  Name: Brandi [name] MRN: [id number] Date of Birth: April 04, 1936 Referring Provider:     Cardiac Rehab from 01/27/2019 in [facility] Cardiac and Pulmonary Rehab  Referring Provider  Bartholome Bill MD      Brandi [name] did not complete her rehab session.  Patient felt nauseous when she was getting out of her car. Patient felt fine as she was being checked in for Cardiac Rehab but as soon as she got on the machine started to feel nauseous again. Patient had checked blood glucose at home 108 mg/dL, ate Cheerios just before coming. Patient HR 80bpm. Patient BP 122/60. Patient recently start Albuterol inhaler. Patient thinks that may be making her feel nauseous. Patient is going home to rest. Patient's son will drive. Patient voiced understanding to call 911 in case of an emergency.

## 2019-02-20 ENCOUNTER — Encounter: Payer: Medicare Other | Admitting: *Deleted

## 2019-02-20 ENCOUNTER — Other Ambulatory Visit: Payer: Self-pay

## 2019-02-20 ENCOUNTER — Other Ambulatory Visit: Payer: Self-pay | Admitting: Urology

## 2019-02-20 DIAGNOSIS — G4730 Sleep apnea, unspecified: Secondary | ICD-10-CM | POA: Diagnosis not present

## 2019-02-20 DIAGNOSIS — N3946 Mixed incontinence: Secondary | ICD-10-CM

## 2019-02-20 DIAGNOSIS — M1990 Unspecified osteoarthritis, unspecified site: Secondary | ICD-10-CM | POA: Diagnosis not present

## 2019-02-20 DIAGNOSIS — K219 Gastro-esophageal reflux disease without esophagitis: Secondary | ICD-10-CM | POA: Diagnosis not present

## 2019-02-20 DIAGNOSIS — Z7902 Long term (current) use of antithrombotics/antiplatelets: Secondary | ICD-10-CM | POA: Diagnosis not present

## 2019-02-20 DIAGNOSIS — F329 Major depressive disorder, single episode, unspecified: Secondary | ICD-10-CM | POA: Diagnosis not present

## 2019-02-20 DIAGNOSIS — E669 Obesity, unspecified: Secondary | ICD-10-CM | POA: Diagnosis not present

## 2019-02-20 DIAGNOSIS — Z7982 Long term (current) use of aspirin: Secondary | ICD-10-CM | POA: Diagnosis not present

## 2019-02-20 DIAGNOSIS — Z951 Presence of aortocoronary bypass graft: Secondary | ICD-10-CM

## 2019-02-20 DIAGNOSIS — E119 Type 2 diabetes mellitus without complications: Secondary | ICD-10-CM | POA: Diagnosis not present

## 2019-02-20 DIAGNOSIS — Z79899 Other long term (current) drug therapy: Secondary | ICD-10-CM | POA: Diagnosis not present

## 2019-02-20 DIAGNOSIS — F419 Anxiety disorder, unspecified: Secondary | ICD-10-CM | POA: Diagnosis not present

## 2019-02-20 DIAGNOSIS — Z794 Long term (current) use of insulin: Secondary | ICD-10-CM | POA: Diagnosis not present

## 2019-02-20 DIAGNOSIS — E785 Hyperlipidemia, unspecified: Secondary | ICD-10-CM | POA: Diagnosis not present

## 2019-02-20 DIAGNOSIS — Z791 Long term (current) use of non-steroidal anti-inflammatories (NSAID): Secondary | ICD-10-CM | POA: Diagnosis not present

## 2019-02-20 LAB — GLUCOSE, CAPILLARY: Glucose-Capillary: 178 mg/dL — ABNORMAL HIGH (ref 70–99)

## 2019-02-20 NOTE — Progress Notes (Signed)
Daily Session Note  Patient Details  Name: Brandi [name] MRN: [id number] Date of Birth: 1936-03-16 Referring Provider:     Cardiac Rehab from 01/27/2019 in [facility] Cardiac and Pulmonary Rehab  Referring Provider  Bartholome Bill MD      Encounter Date: 02/20/2019  Check In:      Social History   Tobacco Use  Smoking Status Never Smoker  Smokeless Tobacco Never Used    Goals Met:  Independence with exercise equipment Exercise tolerated well No report of cardiac concerns or symptoms Strength training completed today  Goals Unmet:  Not Applicable  Comments: Pt able to follow exercise prescription today without complaint.  Will continue to monitor for progression.  Brandi [name] has complaint of nausea and had some clear emesis today. After talking with her-this has been a recurrent problem. She was advised to call her PMD and review her persistent symptoms.   Dr. Emily Filbert is Medical Director for [place] and [facility].

## 2019-02-25 ENCOUNTER — Other Ambulatory Visit: Payer: Self-pay

## 2019-02-25 ENCOUNTER — Other Ambulatory Visit: Payer: Self-pay | Admitting: Urology

## 2019-02-25 ENCOUNTER — Encounter: Payer: Medicare Other | Admitting: *Deleted

## 2019-02-25 DIAGNOSIS — N3946 Mixed incontinence: Secondary | ICD-10-CM

## 2019-02-25 DIAGNOSIS — Z951 Presence of aortocoronary bypass graft: Secondary | ICD-10-CM

## 2019-02-25 NOTE — Progress Notes (Signed)
Daily Session Note  Patient Details  Name: Brandi [name] MRN: [id number] Date of Birth: 01/27/1936 Referring Provider:     Cardiac Rehab from 01/27/2019 in [facility] Cardiac and Pulmonary Rehab  Referring Provider  Bartholome Bill MD      Encounter Date: 02/25/2019  Check In: Session Check In - 02/25/19 1025      Check-In   Supervising physician immediately available to respond to emergencies  See telemetry face sheet for immediately available ER MD    Location  [facility]-Cardiac & Pulmonary Rehab    Staff Present  Heath Lark, RN, BSN, CCRP;Amanda Sommer, BA, ACSM CEP, Exercise Physiologist;Joseph Hood RCP,RRT,BSRT    Virtual Visit  No    Medication changes reported      No    Fall or balance concerns reported     No    Warm-up and Cool-down  Performed on first and last piece of equipment    Resistance Training Performed  Yes    VAD Patient?  No    PAD/SET Patient?  No      Pain Assessment   Currently in Pain?  No/denies          Social History   Tobacco Use  Smoking Status Never Smoker  Smokeless Tobacco Never Used    Goals Met:  Independence with exercise equipment Exercise tolerated well No report of cardiac concerns or symptoms Strength training completed today  Goals Unmet:  Not Applicable  Comments: Pt able to follow exercise prescription today without complaint.  Will continue to monitor for progression. New med for nausea control- took one this AM   Dr. Emily Filbert is Medical Director for [place] and [facility].

## 2019-02-26 ENCOUNTER — Encounter: Payer: Self-pay | Admitting: *Deleted

## 2019-02-26 ENCOUNTER — Other Ambulatory Visit: Payer: Self-pay | Admitting: Urology

## 2019-02-26 DIAGNOSIS — Z951 Presence of aortocoronary bypass graft: Secondary | ICD-10-CM

## 2019-02-26 DIAGNOSIS — N3946 Mixed incontinence: Secondary | ICD-10-CM

## 2019-02-26 NOTE — Progress Notes (Signed)
Cardiac Individual Treatment Plan  Patient Details  Name: Brandi [name] MRN: [id number] Date of Birth: 12-19-1935 Referring Provider:     Cardiac Rehab from 01/27/2019 in [facility] Cardiac and Pulmonary Rehab  Referring Provider  Bartholome Bill MD      Initial Encounter Date:    Cardiac Rehab from 01/27/2019 in [facility] Cardiac and Pulmonary Rehab  Date  01/27/19      Visit Diagnosis: S/P CABG x 3  Patient's Home Medications on Admission:  Current Outpatient Medications:  .  acetaminophen (TYLENOL) 500 MG tablet, Take by mouth., Disp: , Rfl:  .  albuterol (VENTOLIN HFA) 108 (90 Base) MCG/ACT inhaler, , Disp: , Rfl:  .  aspirin EC 81 MG tablet, Take 81 mg by mouth daily., Disp: , Rfl:  .  atorvastatin (LIPITOR) 40 MG tablet, Take by mouth., Disp: , Rfl:  .  budesonide-formoterol (SYMBICORT) 80-4.5 MCG/ACT inhaler, Inhale 2 puffs into the lungs 2 (two) times daily., Disp: , Rfl:  .  Calcium Carb-Cholecalciferol (CALCIUM + D3 PO), Take 1 tablet by mouth daily., Disp: , Rfl:  .  carboxymethylcellulose (REFRESH TEARS) 0.5 % SOLN, Apply 1-2 drops to eye at bedtime. , Disp: , Rfl:  .  carvedilol (COREG) 6.25 MG tablet, Take 6.25 mg by mouth 2 (two) times daily with a meal. , Disp: , Rfl:  .  clopidogrel (PLAVIX) 75 MG tablet, Take 75 mg by mouth daily., Disp: , Rfl:  .  conjugated estrogens (PREMARIN) vaginal cream, Place 1 Applicatorful vaginally daily. Apply 0.3m (pea-sized amount)  just inside the vaginal introitus with a finger-tip every night for two weeks and then Monday, Wednesday and Friday nights. (Patient not taking: Reported on 01/23/2019), Disp: 30 g, Rfl: 12 .  Cyanocobalamin (VITAMIN B-12 PO), Take 1 tablet by mouth daily., Disp: , Rfl:  .  gabapentin (NEURONTIN) 100 MG capsule, , Disp: , Rfl:  .  glipiZIDE (GLUCOTROL) 5 MG tablet, Take 5 mg by mouth 2 (two) times daily. , Disp: , Rfl:  .  hydrochlorothiazide (HYDRODIURIL) 12.5 MG tablet, , Disp: , Rfl: 1 .  Insulin Glargine (LANTUS  SOLOSTAR) 100 UNIT/ML Solostar Pen, Inject 12 Units into the skin at bedtime. , Disp: , Rfl:  .  JANUMET XR 50-1000 MG TB24, Take 1 tablet by mouth 2 (two) times daily., Disp: , Rfl:  .  losartan (COZAAR) 100 MG tablet, Take 100 mg by mouth daily., Disp: , Rfl:  .  meloxicam (MOBIC) 15 MG tablet, Take 15 mg by mouth daily., Disp: , Rfl:  .  mirabegron ER (MYRBETRIQ) 25 MG TB24 tablet, Take 1 tablet (25 mg total) by mouth daily., Disp: 30 tablet, Rfl: 3 .  Multiple Vitamins-Minerals (PRESERVISION/LUTEIN) CAPS, Take 1 capsule by mouth 2 (two) times daily. , Disp: , Rfl:  .  ondansetron (ZOFRAN-ODT) 4 MG disintegrating tablet, Take by mouth., Disp: , Rfl:  .  pantoprazole (PROTONIX) 40 MG tablet, Take 40 mg by mouth daily., Disp: , Rfl:  .  prasugrel (EFFIENT) 10 MG TABS tablet, Take 10 mg by mouth daily., Disp: , Rfl:  .  pregabalin (LYRICA) 75 MG capsule, Take 75 mg by mouth 2 (two) times daily., Disp: , Rfl:  .  senna-docusate (SENOKOT-S) 8.6-50 MG tablet, Take 1 tablet by mouth at bedtime as needed for mild constipation., Disp: 10 tablet, Rfl: 0 .  simvastatin (ZOCOR) 20 MG tablet, Take 20 mg by mouth daily at 6 PM. , Disp: , Rfl:  .  SitaGLIPtin-MetFORMIN HCl (JANUMET XR) 50-1000  MG TB24, Take 1 tablet by mouth 2 (two) times daily., Disp: , Rfl:  .  sodium chloride (ALTAMIST SPRAY) 0.65 % nasal spray, Place into the nose., Disp: , Rfl:  .  traZODone (DESYREL) 50 MG tablet, , Disp: , Rfl:  .  venlafaxine (EFFEXOR) 37.5 MG tablet, , Disp: , Rfl:  .  Verapamil HCl CR 300 MG CP24, Take 1 capsule by mouth daily., Disp: , Rfl:   Past Medical History: Past Medical History:  Diagnosis Date  . Anemia   . Anxiety   . Arthritis, degenerative   . Chronic cystitis   . Depression   . Diabetes mellitus without complication ([place])   . GERD (gastroesophageal reflux disease)   . Headache   . Heart disease   . HLD (hyperlipidemia)   . HTN (hypertension)   . Hypertension   . Incontinence in female   .  Murmur   . Obesity   . Sleep apnea   . Urge incontinence of urine     Tobacco Use: Social History   Tobacco Use  Smoking Status Never Smoker  Smokeless Tobacco Never Used    Labs: Recent Review Flowsheet Data    Labs for ITP Cardiac and Pulmonary Rehab Latest Ref Rng & Units 06/30/2011 04/16/2016   Cholestrol 0 - 200 mg/dL 141 -   LDLCALC 0 - 100 mg/dL 64 -   HDL 40 - 60 mg/dL 63(H) -   Trlycerides 0 - 200 mg/dL 71 -   Hemoglobin A1c 4.8 - 5.6 % 7.7(H) 7.3(H)       Exercise Target Goals: Exercise Program Goal: Individual exercise prescription set using results from initial 6 min walk test and THRR while considering  patient's activity barriers and safety.   Exercise Prescription Goal: Initial exercise prescription builds to 30-45 minutes a day of aerobic activity, 2-3 days per week.  Home exercise guidelines will be given to patient during program as part of exercise prescription that the participant will acknowledge.  Activity Barriers & Risk Stratification: Activity Barriers & Cardiac Risk Stratification - 01/27/19 1613      Activity Barriers & Cardiac Risk Stratification   Activity Barriers  Back Problems;Arthritis;Joint Problems;Deconditioning;Balance Concerns;Muscular Weakness;Shortness of Breath;Assistive Device;History of Falls    Cardiac Risk Stratification  High       6 Minute Walk: 6 Minute Walk    Row Name 01/27/19 1612         6 Minute Walk   Phase  Initial     Distance  620 feet     Walk Time  5.5 minutes     # of Rest Breaks  0     MPH  1.28     METS  1.15     RPE  17     Perceived Dyspnea   2     VO2 Peak  4.02     Symptoms  Yes (comment)     Comments  winded, fatigue     Resting HR  81 bpm     Resting BP  142/74     Resting Oxygen Saturation   97 %     Exercise Oxygen Saturation  during 6 min walk  96 %     Max Ex. HR  134 bpm     Max Ex. BP  156/76     2 Minute Post BP  142/70        Oxygen Initial Assessment:   Oxygen  Re-Evaluation:   Oxygen Discharge (Final Oxygen Re-Evaluation):  Initial Exercise Prescription: Initial Exercise Prescription - 01/27/19 1600      Date of Initial Exercise RX and Referring Provider   Date  01/27/19    Referring Provider  Bartholome Bill MD      Recumbant Bike   Level  1    RPM  50    Watts  5    Minutes  15    METs  1.1      NuStep   Level  1    SPM  80    Minutes  15    METs  1      Arm Ergometer   Level  1    RPM  25    Minutes  15    METs  1      Recumbant Elliptical   Level  1    RPM  50    Minutes  15    METs  1      REL-XR   Level  1    Speed  50    Minutes  15    METs  1.2      T5 Nustep   Level  1    SPM  80    Minutes  15    METs  1      Biostep-RELP   Level  1    SPM  50    Minutes  15    METs  1      Prescription Details   Frequency (times per week)  3    Duration  Progress to 30 minutes of continuous aerobic without signs/symptoms of physical distress      Intensity   THRR 40-80% of Max Heartrate  105-128    Ratings of Perceived Exertion  11-13    Perceived Dyspnea  0-4      Progression   Progression  Continue to progress workloads to maintain intensity without signs/symptoms of physical distress.      Resistance Training   Training Prescription  Yes    Weight  2 lbs    Reps  10-15       Perform Capillary Blood Glucose checks as needed.  Exercise Prescription Changes: Exercise Prescription Changes    Row Name 01/27/19 1600 02/06/19 1500 02/18/19 1200         Response to Exercise   Blood Pressure (Admit)  142/74  122/72  124/80     Blood Pressure (Exercise)  156/76  148/74  136/80     Blood Pressure (Exit)  142/70  128/88  130/80     Heart Rate (Admit)  81 bpm  76 bpm  83 bpm     Heart Rate (Exercise)  134 bpm  111 bpm  112 bpm     Heart Rate (Exit)  83 bpm  84 bpm  76 bpm     Oxygen Saturation (Admit)  97 %  -  -     Oxygen Saturation (Exercise)  96 %  -  -     Rating of Perceived Exertion  (Exercise)  17  15  13      Perceived Dyspnea (Exercise)  2  -  -     Symptoms  winded, fatigue  fatigue  fatigue     Comments  walk test results  -  -     Duration  -  Continue with 30 min of aerobic exercise without signs/symptoms of physical distress.  Continue with 30 min of aerobic exercise without signs/symptoms of  physical distress.     Intensity  -  THRR unchanged  THRR unchanged       Progression   Progression  -  Continue to progress workloads to maintain intensity without signs/symptoms of physical distress.  Continue to progress workloads to maintain intensity without signs/symptoms of physical distress.     Average METs  -  1.57  1.27       Resistance Training   Training Prescription  -  Yes  Yes     Weight  -  3 lbs  3 lbs     Reps  -  10-15  10-15       Interval Training   Interval Training  -  No  No       NuStep   Level  -  -  1     Minutes  -  -  15     METs  -  -  1.8       Arm Ergometer   Level  -  1  1     Minutes  -  15  15     METs  -  1.2  1       REL-XR   Level  -  1  -     Minutes  -  15  -     METs  -  1  -       T5 Nustep   Level  -  1  -     Minutes  -  15  -     METs  -  1.7  -       Biostep-RELP   Level  -  1  1     Minutes  -  15  15     METs  -  2  1       Home Exercise Plan   Plans to continue exercise at  -  -  Home (comment) walking     Frequency  -  -  Add 1 additional day to program exercise sessions.     Initial Home Exercises Provided  -  -  02/14/19        Exercise Comments: Exercise Comments    Row Name 01/28/19 1026 02/07/19 1018 02/20/19 1153       Exercise Comments  First full day of exercise!  Patient was oriented to gym and equipment including functions, settings, policies, and procedures.  Patient's individual exercise prescription and treatment plan were reviewed.  All starting workloads were established based on the results of the 6 minute walk test done at initial orientation visit.  The plan for exercise  progression was also introduced and progression will be customized based on patient's performance and goals.  Weight up 2 pounds- some SOB noted by Brandi [name].  Advised call MD/ED if gets worse  Brandi [name] has complaint of nausea and had some clear emesis today. After talking with her-this has been a recurrent problem. She was advised to call her PMD and review her persistent symptoms.        Exercise Goals and Review: Exercise Goals    Row Name 01/27/19 1617             Exercise Goals   Increase Physical Activity  Yes       Intervention  Provide advice, education, support and counseling about physical activity/exercise needs.;Develop an individualized exercise prescription for aerobic and resistive training based on initial evaluation findings, risk stratification,  comorbidities and participant's personal goals.       Expected Outcomes  Short Term: Attend rehab on a regular basis to increase amount of physical activity.;Long Term: Add in home exercise to make exercise part of routine and to increase amount of physical activity.;Long Term: Exercising regularly at least 3-5 days a week.       Increase Strength and Stamina  Yes       Intervention  Provide advice, education, support and counseling about physical activity/exercise needs.;Develop an individualized exercise prescription for aerobic and resistive training based on initial evaluation findings, risk stratification, comorbidities and participant's personal goals.       Expected Outcomes  Short Term: Increase workloads from initial exercise prescription for resistance, speed, and METs.;Short Term: Perform resistance training exercises routinely during rehab and add in resistance training at home;Long Term: Improve cardiorespiratory fitness, muscular endurance and strength as measured by increased METs and functional capacity (6MWT)       Able to understand and use rate of perceived exertion (RPE) scale  Yes       Intervention  Provide education and  explanation on how to use RPE scale       Expected Outcomes  Long Term:  Able to use RPE to guide intensity level when exercising independently;Short Term: Able to use RPE daily in rehab to express subjective intensity level       Able to understand and use Dyspnea scale  Yes       Intervention  Provide education and explanation on how to use Dyspnea scale       Expected Outcomes  Short Term: Able to use Dyspnea scale daily in rehab to express subjective sense of shortness of breath during exertion;Long Term: Able to use Dyspnea scale to guide intensity level when exercising independently       Knowledge and understanding of Target Heart Rate Range (THRR)  Yes       Intervention  Provide education and explanation of THRR including how the numbers were predicted and where they are located for reference       Expected Outcomes  Short Term: Able to state/look up THRR;Short Term: Able to use daily as guideline for intensity in rehab;Long Term: Able to use THRR to govern intensity when exercising independently       Able to check pulse independently  Yes       Intervention  Provide education and demonstration on how to check pulse in carotid and radial arteries.;Review the importance of being able to check your own pulse for safety during independent exercise       Expected Outcomes  Short Term: Able to explain why pulse checking is important during independent exercise;Long Term: Able to check pulse independently and accurately       Understanding of Exercise Prescription  Yes       Intervention  Provide education, explanation, and written materials on patient's individual exercise prescription       Expected Outcomes  Short Term: Able to explain program exercise prescription;Long Term: Able to explain home exercise prescription to exercise independently          Exercise Goals Re-Evaluation : Exercise Goals Re-Evaluation    Row Name 01/28/19 1028 02/06/19 1541 02/14/19 1032 02/18/19 1253        Exercise Goal Re-Evaluation   Exercise Goals Review  Increase Physical Activity;Increase Strength and Stamina;Able to understand and use rate of perceived exertion (RPE) scale;Able to understand and use Dyspnea scale;Knowledge and understanding of Target  Heart Rate Range (THRR);Understanding of Exercise Prescription  Increase Physical Activity;Increase Strength and Stamina;Understanding of Exercise Prescription  Increase Physical Activity;Increase Strength and Stamina;Able to understand and use rate of perceived exertion (RPE) scale;Knowledge and understanding of Target Heart Rate Range (THRR);Able to check pulse independently  Increase Physical Activity;Increase Strength and Stamina;Able to understand and use rate of perceived exertion (RPE) scale;Knowledge and understanding of Target Heart Rate Range (THRR);Able to check pulse independently    Comments  Reviewed RPE scale, THR and program prescription with pt today.  Pt voiced understanding and was given a copy of goals to take home.  Brandi [name] is off to a good start in rehab.  She getting in all 30 of her minutes much to her surprise.  We will continue to monitor her progress.  Brandi [name] has a stationary bike at home.  She plans to start by adding one day a week in addition to HT sessions.  Brandi [name] is doing well in rehab.  She is now on level 2 on the NuStep.  We will continue to monitor her progress.    Expected Outcomes  Short: Use RPE daily to regulate intensity. Long: Follow program prescription in THR.  Short: Continue to attend class regulalry.  Long: Continue to follow program prescription.  Short - add one exrecise session in addition to HT  Short: Continue to increase speed on the BioStep.  Long: Continue to add in exercise at home on off days.       Discharge Exercise Prescription (Final Exercise Prescription Changes): Exercise Prescription Changes - 02/18/19 1200      Response to Exercise   Blood Pressure (Admit)  124/80    Blood Pressure  (Exercise)  136/80    Blood Pressure (Exit)  130/80    Heart Rate (Admit)  83 bpm    Heart Rate (Exercise)  112 bpm    Heart Rate (Exit)  76 bpm    Rating of Perceived Exertion (Exercise)  13    Symptoms  fatigue    Duration  Continue with 30 min of aerobic exercise without signs/symptoms of physical distress.    Intensity  THRR unchanged      Progression   Progression  Continue to progress workloads to maintain intensity without signs/symptoms of physical distress.    Average METs  1.27      Resistance Training   Training Prescription  Yes    Weight  3 lbs    Reps  10-15      Interval Training   Interval Training  No      NuStep   Level  1    Minutes  15    METs  1.8      Arm Ergometer   Level  1    Minutes  15    METs  1      Biostep-RELP   Level  1    Minutes  15    METs  1      Home Exercise Plan   Plans to continue exercise at  Home (comment)   walking   Frequency  Add 1 additional day to program exercise sessions.    Initial Home Exercises Provided  02/14/19       Nutrition:  Target Goals: Understanding of nutrition guidelines, daily intake of sodium <1554m, cholesterol <2037m calories 30% from fat and 7% or less from saturated fats, daily to have 5 or more servings of fruits and vegetables.  Biometrics: Pre Biometrics - 01/27/19 1617  Pre Biometrics   Height  5' 1"  (1.549 m)    Weight  198 lb 6.4 oz (90 kg)    BMI (Calculated)  37.51    Single Leg Stand  0.37 seconds        Nutrition Therapy Plan and Nutrition Goals: Nutrition Therapy & Goals - 01/27/19 1526      Nutrition Therapy   Diet  Low Na, HH, DM diet    Drug/Food Interactions  Statins/Certain Fruits   lipitor   Protein (specify units)  75g    Fiber  25 grams    Whole Grain Foods  3 servings    Saturated Fats  12 max. grams    Fruits and Vegetables  5 servings/day    Sodium  1.5 grams      Personal Nutrition Goals   Nutrition Goal  ST: increase vegetable intake (+1 serving  3x/week) LT: walk again and get rid of cane.    Comments  Lantus, janumet, glipizidesenokot, coregCa + D3 A1c 10/2018 was 6.7 (WNL). Pt reports for B she eats grits, eggos, special K or cinnamon cheerios with whole milk. L tomato sandwich with full fat mayo and white bread. Dinner is chicken with the skin and/or bacon, baked pork chops and instant potatoes or white pasta. Pt will use margarine, high fat protein, whole fat dairy, fried foods at least 2x/week, minimal F and V. Pt suggested she needed to eat more vegetables, but would still like to review the content. Discussed HH DM eating.      Intervention Plan   Intervention  Prescribe, educate and counsel regarding individualized specific dietary modifications aiming towards targeted core components such as weight, hypertension, lipid management, diabetes, heart failure and other comorbidities.;Nutrition handout(s) given to patient.    Expected Outcomes  Short Term Goal: Understand basic principles of dietary content, such as calories, fat, sodium, cholesterol and nutrients.;Long Term Goal: Adherence to prescribed nutrition plan.;Short Term Goal: A plan has been developed with personal nutrition goals set during dietitian appointment.       Nutrition Assessments: Nutrition Assessments - 01/27/19 1542      MEDFICTS Scores   Pre Score  100       Nutrition Goals Re-Evaluation: Nutrition Goals Re-Evaluation    Row Name 02/11/19 1034             Goals   Current Weight  198 lb 6.4 oz (90 kg)       Nutrition Goal  ST: increase vegetable intake (+1 serving everyday/week) LT: walk again and get rid of cane.       Comment  Pt reports now eating lima beans and corn 3x/week, now moving to everyday for next goal. Discussed the importance of variety in the diet and in vegetables. Pt asked about saturated vs unsaturated fat, discussed what they are and what some healthy swaps would be.       Expected Outcome  ST: increase vegetable intake (+1 serving  everyday/week) LT: walk again and get rid of cane.          Nutrition Goals Discharge (Final Nutrition Goals Re-Evaluation): Nutrition Goals Re-Evaluation - 02/11/19 1034      Goals   Current Weight  198 lb 6.4 oz (90 kg)    Nutrition Goal  ST: increase vegetable intake (+1 serving everyday/week) LT: walk again and get rid of cane.    Comment  Pt reports now eating lima beans and corn 3x/week, now moving to everyday for next goal. Discussed the importance  of variety in the diet and in vegetables. Pt asked about saturated vs unsaturated fat, discussed what they are and what some healthy swaps would be.    Expected Outcome  ST: increase vegetable intake (+1 serving everyday/week) LT: walk again and get rid of cane.       Psychosocial: Target Goals: Acknowledge presence or absence of significant depression and/or stress, maximize coping skills, provide positive support system. Participant is able to verbalize types and ability to use techniques and skills needed for reducing stress and depression.   Initial Review & Psychosocial Screening: Initial Psych Review & Screening - 01/23/19 1151      Initial Review   Current issues with  Current Sleep Concerns;Current Stress Concerns    Comments  Brandi [name] is trying to regulate her sleep still from her admission in December. She is taking trazodone, but is still drowsy most of the morning so she is trying to slowly come off of it. She reported her only source of stress right now is Covid. She lives with 2 of her children, so they have been making sure she has everything she needs. She has not been out much since March.      Family Dynamics   Good Support System?  Yes      Barriers   Psychosocial barriers to participate in program  There are no identifiable barriers or psychosocial needs.;The patient should benefit from training in stress management and relaxation.      Screening Interventions   Interventions  Encouraged to exercise;Provide feedback  about the scores to participant    Expected Outcomes  Short Term goal: Utilizing psychosocial counselor, staff and physician to assist with identification of specific Stressors or current issues interfering with healing process. Setting desired goal for each stressor or current issue identified.;Long Term Goal: Stressors or current issues are controlled or eliminated.;Short Term goal: Identification and review with participant of any Quality of Life or Depression concerns found by scoring the questionnaire.;Long Term goal: The participant improves quality of Life and PHQ9 Scores as seen by post scores and/or verbalization of changes       Quality of Life Scores:  Quality of Life - 01/27/19 1618      Quality of Life   Select  Quality of Life      Quality of Life Scores   Health/Function Pre  17.58 %    Socioeconomic Pre  20.86 %    Psych/Spiritual Pre  20 %    Family Pre  23.63 %    GLOBAL Pre  19.72 %      Scores of 19 and below usually indicate a poorer quality of life in these areas.  A difference of  2-3 points is a clinically meaningful difference.  A difference of 2-3 points in the total score of the Quality of Life Index has been associated with significant improvement in overall quality of life, self-image, physical symptoms, and general health in studies assessing change in quality of life.  PHQ-9: Recent Review Flowsheet Data    Depression screen [facility] 2/9 01/27/2019   Decreased Interest 1   Down, Depressed, Hopeless 1   PHQ - 2 Score 2   Altered sleeping 2   Tired, decreased energy 1   Change in appetite 2   Feeling bad or failure about yourself  1   Trouble concentrating 0   Moving slowly or fidgety/restless 0   Suicidal thoughts 0   PHQ-9 Score 8   Difficult doing work/chores Somewhat difficult  Interpretation of Total Score  Total Score Depression Severity:  1-4 = Minimal depression, 5-9 = Mild depression, 10-14 = Moderate depression, 15-19 = Moderately severe  depression, 20-27 = Severe depression   Psychosocial Evaluation and Intervention:   Psychosocial Re-Evaluation:   Psychosocial Discharge (Final Psychosocial Re-Evaluation):   Vocational Rehabilitation: Provide vocational rehab assistance to qualifying candidates.   Vocational Rehab Evaluation & Intervention: Vocational Rehab - 01/23/19 1143      Initial Vocational Rehab Evaluation & Intervention   Assessment shows need for Vocational Rehabilitation  No       Education: Education Goals: Education classes will be provided on a variety of topics geared toward better understanding of heart health and risk factor modification. Participant will state understanding/return demonstration of topics presented as noted by education test scores.  Learning Barriers/Preferences: Learning Barriers/Preferences - 01/23/19 1143      Learning Barriers/Preferences   Learning Barriers  None    Learning Preferences  Individual Instruction       Education Topics:  AED/CPR: - Group verbal and written instruction with the use of models to demonstrate the basic use of the AED with the basic ABC's of resuscitation.   General Nutrition Guidelines/Fats and Fiber: -Group instruction provided by verbal, written material, models and posters to present the general guidelines for heart healthy nutrition. Gives an explanation and review of dietary fats and fiber.   Controlling Sodium/Reading Food Labels: -Group verbal and written material supporting the discussion of sodium use in heart healthy nutrition. Review and explanation with models, verbal and written materials for utilization of the food label.   Exercise Physiology & General Exercise Guidelines: - Group verbal and written instruction with models to review the exercise physiology of the cardiovascular system and associated critical values. Provides general exercise guidelines with specific guidelines to those with heart or lung disease.     Aerobic Exercise & Resistance Training: - Gives group verbal and written instruction on the various components of exercise. Focuses on aerobic and resistive training programs and the benefits of this training and how to safely progress through these programs..   Flexibility, Balance, Mind/Body Relaxation: Provides group verbal/written instruction on the benefits of flexibility and balance training, including mind/body exercise modes such as yoga, pilates and tai chi.  Demonstration and skill practice provided.   Stress and Anxiety: - Provides group verbal and written instruction about the health risks of elevated stress and causes of high stress.  Discuss the correlation between heart/lung disease and anxiety and treatment options. Review healthy ways to manage with stress and anxiety.   Depression: - Provides group verbal and written instruction on the correlation between heart/lung disease and depressed mood, treatment options, and the stigmas associated with seeking treatment.   Anatomy & Physiology of the Heart: - Group verbal and written instruction and models provide basic cardiac anatomy and physiology, with the coronary electrical and arterial systems. Review of Valvular disease and Heart Failure   Cardiac Procedures: - Group verbal and written instruction to review commonly prescribed medications for heart disease. Reviews the medication, class of the drug, and side effects. Includes the steps to properly store meds and maintain the prescription regimen. (beta blockers and nitrates)   Cardiac Medications I: - Group verbal and written instruction to review commonly prescribed medications for heart disease. Reviews the medication, class of the drug, and side effects. Includes the steps to properly store meds and maintain the prescription regimen.   Cardiac Medications II: -Group verbal and written instruction to review  commonly prescribed medications for heart disease.  Reviews the medication, class of the drug, and side effects. (all other drug classes)    Go Sex-Intimacy & Heart Disease, Get SMART - Goal Setting: - Group verbal and written instruction through game format to discuss heart disease and the return to sexual intimacy. Provides group verbal and written material to discuss and apply goal setting through the application of the S.M.A.R.T. Method.   Other Matters of the Heart: - Provides group verbal, written materials and models to describe Stable Angina and Peripheral Artery. Includes description of the disease process and treatment options available to the cardiac patient.   Exercise & Equipment Safety: - Individual verbal instruction and demonstration of equipment use and safety with use of the equipment.   Cardiac Rehab from 01/27/2019 in [facility] Cardiac and Pulmonary Rehab  Date  01/27/19  Educator  [facility]  Instruction Review Code  1- Verbalizes Understanding      Infection Prevention: - Provides verbal and written material to individual with discussion of infection control including proper hand washing and proper equipment cleaning during exercise session.   Cardiac Rehab from 01/27/2019 in [facility] Cardiac and Pulmonary Rehab  Date  01/27/19  Educator  [facility]  Instruction Review Code  1- Verbalizes Understanding      Falls Prevention: - Provides verbal and written material to individual with discussion of falls prevention and safety.   Cardiac Rehab from 01/27/2019 in [facility] Cardiac and Pulmonary Rehab  Date  01/27/19  Educator  [facility]  Instruction Review Code  1- Verbalizes Understanding      Diabetes: - Individual verbal and written instruction to review signs/symptoms of diabetes, desired ranges of glucose level fasting, after meals and with exercise. Acknowledge that pre and post exercise glucose checks will be done for 3 sessions at entry of program.   Cardiac Rehab from 01/27/2019 in [facility] Cardiac and Pulmonary Rehab  Date  01/23/19  Educator   [facility]  Instruction Review Code  1- Verbalizes Understanding      Know Your Numbers and Risk Factors: -Group verbal and written instruction about important numbers in your health.  Discussion of what are risk factors and how they play a role in the disease process.  Review of Cholesterol, Blood Pressure, Diabetes, and BMI and the role they play in your overall health.   Sleep Hygiene: -Provides group verbal and written instruction about how sleep can affect your health.  Define sleep hygiene, discuss sleep cycles and impact of sleep habits. Review good sleep hygiene tips.    Other: -Provides group and verbal instruction on various topics (see comments)   Knowledge Questionnaire Score: Knowledge Questionnaire Score - 01/28/19 1026      Knowledge Questionnaire Score   Pre Score  21/26   test reviewed with pt today  Education Focus:  Angina, Heart Failure, PAD, Nutrition, Exercise      Core Components/Risk Factors/Patient Goals at Admission: Personal Goals and Risk Factors at Admission - 01/27/19 1618      Core Components/Risk Factors/Patient Goals on Admission    Weight Management  Yes;Obesity;Weight Loss    Intervention  Weight Management: Develop a combined nutrition and exercise program designed to reach desired caloric intake, while maintaining appropriate intake of nutrient and fiber, sodium and fats, and appropriate energy expenditure required for the weight goal.;Weight Management: Provide education and appropriate resources to help participant work on and attain dietary goals.;Weight Management/Obesity: Establish reasonable short term and long term weight goals.;Obesity: Provide education and appropriate resources to  help participant work on and attain dietary goals.    Admit Weight  198 lb 6.4 oz (90 kg)    Goal Weight: Short Term  193 lb (87.5 kg)    Goal Weight: Long Term  188 lb (85.3 kg)    Expected Outcomes  Short Term: Continue to assess and modify interventions until  short term weight is achieved;Long Term: Adherence to nutrition and physical activity/exercise program aimed toward attainment of established weight goal;Weight Loss: Understanding of general recommendations for a balanced deficit meal plan, which promotes 1-2 lb weight loss per week and includes a negative energy balance of [phone] kcal/d;Understanding recommendations for meals to include 15-35% energy as protein, 25-35% energy from fat, 35-60% energy from carbohydrates, less than 213m of dietary cholesterol, 20-35 gm of total fiber daily;Understanding of distribution of calorie intake throughout the day with the consumption of 4-5 meals/snacks    Diabetes  Yes    Intervention  Provide education about signs/symptoms and action to take for hypo/hyperglycemia.;Provide education about proper nutrition, including hydration, and aerobic/resistive exercise prescription along with prescribed medications to achieve blood glucose in normal ranges: Fasting glucose 65-99 mg/dL    Expected Outcomes  Short Term: Participant verbalizes understanding of the signs/symptoms and immediate care of hyper/hypoglycemia, proper foot care and importance of medication, aerobic/resistive exercise and nutrition plan for blood glucose control.;Long Term: Attainment of HbA1C < 7%.    Hypertension  Yes    Intervention  Provide education on lifestyle modifcations including regular physical activity/exercise, weight management, moderate sodium restriction and increased consumption of fresh fruit, vegetables, and low fat dairy, alcohol moderation, and smoking cessation.;Monitor prescription use compliance.    Expected Outcomes  Short Term: Continued assessment and intervention until BP is < 140/982mHG in hypertensive participants. < 130/8067mG in hypertensive participants with diabetes, heart failure or chronic kidney disease.;Long Term: Maintenance of blood pressure at goal levels.    Lipids  Yes    Intervention  Provide education and  support for participant on nutrition & aerobic/resistive exercise along with prescribed medications to achieve LDL <41m24mDL >40mg34m Expected Outcomes  Short Term: Participant states understanding of desired cholesterol values and is compliant with medications prescribed. Participant is following exercise prescription and nutrition guidelines.;Long Term: Cholesterol controlled with medications as prescribed, with individualized exercise RX and with personalized nutrition plan. Value goals: LDL < 41mg,31m > 40 mg.       Core Components/Risk Factors/Patient Goals Review:  Goals and Risk Factor Review    Row Name 02/14/19 1033             Core Components/Risk Factors/Patient Goals Review   Personal Goals Review  Hypertension;Lipids;Improve shortness of breath with ADL's       Review  Brandi [name] meds as prescribed.  She recently started taking albuterol.  She reports not being as sore from exercise as in the first week.       Expected Outcomes  Short - continue to take meds as directed Long - manage risk factors          Core Components/Risk Factors/Patient Goals at Discharge (Final Review):  Goals and Risk Factor Review - 02/14/19 1033      Core Components/Risk Factors/Patient Goals Review   Personal Goals Review  Hypertension;Lipids;Improve shortness of breath with ADL's    Review  Brandi Earlean Fidalgoking meds as prescribed.  She recently started taking albuterol.  She reports not being as sore from exercise as in the  first week.    Expected Outcomes  Short - continue to take meds as directed Long - manage risk factors       ITP Comments: ITP Comments    Row Name 01/23/19 1139 01/27/19 1547 01/29/19 0635 02/07/19 1018 02/20/19 1152   ITP Comments  Virtual Orientation with RN completed. Diagnosis can be found in [facility] 05/25/18. EP/RD orientation scheduled 8/10 at 2  Completed 6MWT, gym orientation, and RD evaluation. Initial ITP created and sent for review to Dr. Emily Filbert,  Medical Director.  30 Day Review Completed today. Continue with ITP unless changed by Medical Director review.  New start to program  Weight up 2 pounds- some SOB noted by Brandi [name].  Advised call MD/ED if gets worse  Brandi [name] has complaint of nausea and had some clear emesis today. After talking with her-this has been a recurrent problem. She was advised to call her PMD and review her persistent symptoms.   Brandi [name] Name 02/26/19 0653           ITP Comments  30 Day review. Continue with ITP unless directed changes per Medical Director review.  Brandi [name] has new anti nausea med          Comments:

## 2019-02-27 ENCOUNTER — Encounter: Payer: Medicare Other | Admitting: *Deleted

## 2019-02-27 ENCOUNTER — Other Ambulatory Visit: Payer: Self-pay

## 2019-02-27 DIAGNOSIS — Z951 Presence of aortocoronary bypass graft: Secondary | ICD-10-CM

## 2019-02-27 NOTE — Progress Notes (Signed)
Daily Session Note  Patient Details  Name: Brandi [name] MRN: [id number] Date of Birth: 05/15/1936 Referring Provider:     Cardiac Rehab from 01/27/2019 in [facility] Cardiac and Pulmonary Rehab  Referring Provider  Fath, Kenneth MD      Encounter Date: 02/27/2019  Check In: Session Check In - 02/27/19 1030      Check-In   Supervising physician immediately available to respond to emergencies  See telemetry face sheet for immediately available ER MD    Location  [facility]-Cardiac & Pulmonary Rehab    Staff Present  Meredith Craven, RN BSN;Amanda Sommer, BA, ACSM CEP, Exercise Physiologist;Jeanna Durrell BS, Exercise Physiologist    Virtual Visit  No    Medication changes reported      Yes    Comments  on miralax and sucralfate    Fall or balance concerns reported     No    Warm-up and Cool-down  Performed on first and last piece of equipment    Resistance Training Performed  Yes    VAD Patient?  No    PAD/SET Patient?  No      Pain Assessment   Currently in Pain?  No/denies          Social History   Tobacco Use  Smoking Status Never Smoker  Smokeless Tobacco Never Used    Goals Met:  Independence with exercise equipment Exercise tolerated well No report of cardiac concerns or symptoms Strength training completed today  Goals Unmet:  Not Applicable  Comments: Pt able to follow exercise prescription today without complaint.  Will continue to monitor for progression.    Dr. Mark Miller is Medical Director for [facility] and [facility]. 

## 2019-03-04 ENCOUNTER — Other Ambulatory Visit: Payer: Self-pay

## 2019-03-04 ENCOUNTER — Encounter: Payer: Medicare Other | Admitting: *Deleted

## 2019-03-04 DIAGNOSIS — Z951 Presence of aortocoronary bypass graft: Secondary | ICD-10-CM | POA: Diagnosis not present

## 2019-03-04 NOTE — Progress Notes (Signed)
Daily Session Note  Patient Details  Name: Brandi [name] MRN: [id number] Date of Birth: 14-Apr-1936 Referring Provider:     Cardiac Rehab from 01/27/2019 in [facility] Cardiac and Pulmonary Rehab  Referring Provider  Bartholome Bill MD      Encounter Date: 03/04/2019  Check In: Session Check In - 03/04/19 1028      Check-In   Supervising physician immediately available to respond to emergencies  See telemetry face sheet for immediately available ER MD    Location  [facility]-Cardiac & Pulmonary Rehab    Staff Present  Heath Lark, RN, BSN, CCRP;Melissa Bolivar RDN, LDN;Joseph [employer], [place], ACSM CEP, Exercise Physiologist    Virtual Visit  No    Medication changes reported      No    Fall or balance concerns reported     No    Warm-up and Cool-down  Performed on first and last piece of equipment    Resistance Training Performed  Yes    VAD Patient?  No    PAD/SET Patient?  No      Pain Assessment   Currently in Pain?  No/denies          Social History   Tobacco Use  Smoking Status Never Smoker  Smokeless Tobacco Never Used    Goals Met:  Independence with exercise equipment Exercise tolerated well No report of cardiac concerns or symptoms  Goals Unmet:  Not Applicable  Comments: Pt able to follow exercise prescription today without complaint.  Will continue to monitor for progression.    Dr. Emily Filbert is Medical Director for [place] and [facility].

## 2019-03-06 ENCOUNTER — Other Ambulatory Visit: Payer: Self-pay

## 2019-03-06 DIAGNOSIS — Z951 Presence of aortocoronary bypass graft: Secondary | ICD-10-CM | POA: Diagnosis not present

## 2019-03-06 NOTE — Progress Notes (Signed)
Daily Session Note  Patient Details  Name: Brandi [name] MRN: [id number] Date of Birth: [birth year] Referring Provider:     Cardiac Rehab from 01/27/2019 in [facility] Cardiac and Pulmonary Rehab  Referring Provider  Bartholome Bill MD      Encounter Date: 03/06/2019  Check In: Session Check In - 03/06/19 1018      Check-In   Supervising physician immediately available to respond to emergencies  See telemetry face sheet for immediately available ER MD    Location  [facility]-Cardiac & Pulmonary Rehab    Staff Present  Vida Rigger RN, Vickki Hearing, BA, ACSM CEP, Exercise Physiologist;Jeanna Durrell BS, Exercise Physiologist    Virtual Visit  No    Medication changes reported      No    Fall or balance concerns reported     No    Warm-up and Cool-down  Performed on first and last piece of equipment    Resistance Training Performed  Yes    VAD Patient?  No    PAD/SET Patient?  No      Pain Assessment   Currently in Pain?  No/denies    Multiple Pain Sites  No          Social History   Tobacco Use  Smoking Status Never Smoker  Smokeless Tobacco Never Used    Goals Met:  Independence with exercise equipment Exercise tolerated well No report of cardiac concerns or symptoms Strength training completed today  Goals Unmet:  Not Applicable  Comments: Pt able to follow exercise prescription today without complaint.  Will continue to monitor for progression.   Dr. Emily Filbert is Medical Director for [place] and [facility].

## 2019-03-07 DIAGNOSIS — Z951 Presence of aortocoronary bypass graft: Secondary | ICD-10-CM

## 2019-03-07 NOTE — Progress Notes (Signed)
Daily Session Note  Patient Details  Name: JACKELYN ILLINGWORTH MRN: [id number] Date of Birth: 01-29-1936 Referring Provider:     Cardiac Rehab from 01/27/2019 in [facility] Cardiac and Pulmonary Rehab  Referring Provider  Bartholome Bill MD      Encounter Date: 03/07/2019  Check In:      Social History   Tobacco Use  Smoking Status Never Smoker  Smokeless Tobacco Never Used    Goals Met:  Independence with exercise equipment Exercise tolerated well No report of cardiac concerns or symptoms Strength training completed today  Goals Unmet:  Not Applicable  Comments: Pt able to follow exercise prescription today without complaint.  Will continue to monitor for progression.   Dr. Emily Filbert is Medical Director for [place] and [facility].

## 2019-03-10 NOTE — Progress Notes (Signed)
03/11/2019 11:54 AM   Brandi [name] 06/13/1936 [id number]  Referring provider: Baxter Hire, MD [place],  [place] [id number]  Chief Complaint  Patient presents with  . Urinary Incontinence    HPI: 83 yo female with history of recurrent UTI's, incontinence, vaginal atrophy, cystocele and nocturia presents for follow up.    Recurrent UTI's Her risk factor for recurrent UTI's vaginal atrophy, incontinence and age.  She had one documented positive culture for pseudomonas in 02/2017.   She has not had an UTI in over two years.    Vaginal atrophy She is not using the cream.     Incontinence She is experiencing urgency x 4-7 (worsened), frequency x 8 or more (stable), incontinence x 4-7 (worsened), nocturia x 4-7 (worsened), she is not engaging in toilet mapping, she is not limiting her fluid intake to avoid trips to the restroom.  Her most bothersome symptoms are frequency, urgency and nocturia.  She is currently on Myrbetriq 25 mg daily.  Her BP is 147/81.  PVR is 2 mL.   She states she feels the Myrbetriq is helpful with controlling her urinary symptoms.    Nocturia Patient is using her CPAP machine.  She is having nocturia x 4-7 (worsened)    Cystocele Found incidentally on exam. Patient denies any vaginal bulge.  PMH: Past Medical History:  Diagnosis Date  . Anemia   . Anxiety   . Arthritis, degenerative   . Chronic cystitis   . Depression   . Diabetes mellitus without complication ([place])   . GERD (gastroesophageal reflux disease)   . Headache   . Heart disease   . HLD (hyperlipidemia)   . HTN (hypertension)   . Hypertension   . Incontinence in female   . Murmur   . Obesity   . Sleep apnea   . Urge incontinence of urine     Surgical History: Past Surgical History:  Procedure Laterality Date  . BREAST BIOPSY Right 2004   neg  . BUNIONECTOMY    . CARDIAC SURGERY     Stent  . CHOLECYSTECTOMY    . COLONOSCOPY WITH PROPOFOL N/A  06/14/2017   Procedure: COLONOSCOPY WITH PROPOFOL;  Surgeon: Lollie Sails, MD;  Location: [facility] ENDOSCOPY;  Service: Endoscopy;  Laterality: N/A;  . JOINT REPLACEMENT     TKR Bilateral  . LEFT HEART CATH AND CORONARY ANGIOGRAPHY N/A 05/22/2018   Procedure: LEFT HEART CATH AND CORONARY ANGIOGRAPHY;  Surgeon: Corey Skains, MD;  Location: [facility] CV LAB;  Service: Cardiovascular;  Laterality: N/A;  . LOOP RECORDER INSERTION N/A 09/20/2016   Procedure: Loop Recorder Insertion;  Surgeon: Isaias Cowman, MD;  Location: [facility];  Service: Cardiovascular;  Laterality: N/A;  . SEPTOPLASTY      Home Medications:  Allergies as of 03/11/2019      Reactions   Micardis [telmisartan] Other (See Comments)   Dizziness   Accupril [quinapril Hcl] Rash   Quinapril Rash      Medication List       Accurate as of March 11, 2019 11:54 AM. If you have any questions, ask your nurse or doctor.        acetaminophen 500 MG tablet Commonly known as: TYLENOL Take by mouth.   albuterol 108 (90 Base) MCG/ACT inhaler Commonly known as: VENTOLIN HFA   [place] Spray 0.65 % nasal spray Generic drug: sodium chloride Place into the nose.   aspirin EC 81 MG tablet Take 81  mg by mouth daily.   atorvastatin 40 MG tablet Commonly known as: LIPITOR Take by mouth.   budesonide-formoterol 80-4.5 MCG/ACT inhaler Commonly known as: SYMBICORT Inhale 2 puffs into the lungs 2 (two) times daily.   CALCIUM + D3 PO Take 1 tablet by mouth daily.   carvedilol 6.25 MG tablet Commonly known as: COREG Take 6.25 mg by mouth 2 (two) times daily with a meal.   clopidogrel 75 MG tablet Commonly known as: PLAVIX Take 75 mg by mouth daily.   conjugated estrogens vaginal cream Commonly known as: Premarin Place 1 Applicatorful vaginally daily. Apply 0.5mg  (pea-sized amount)  just inside the vaginal introitus with a finger-tip every night for two weeks and then Monday, Wednesday and  Friday nights.   gabapentin 100 MG capsule Commonly known as: NEURONTIN   glipiZIDE 5 MG tablet Commonly known as: GLUCOTROL Take 5 mg by mouth 2 (two) times daily.   hydrochlorothiazide 12.5 MG tablet Commonly known as: HYDRODIURIL   Janumet XR 50-1000 MG Tb24 Generic drug: SitaGLIPtin-MetFORMIN HCl Take 1 tablet by mouth 2 (two) times daily.   Janumet XR 50-1000 MG Tb24 Generic drug: SitaGLIPtin-MetFORMIN HCl Take 1 tablet by mouth 2 (two) times daily.   Lantus SoloStar 100 UNIT/ML Solostar Pen Generic drug: Insulin Glargine Inject 12 Units into the skin at bedtime.   losartan 100 MG tablet Commonly known as: COZAAR Take 100 mg by mouth daily.   meloxicam 15 MG tablet Commonly known as: MOBIC Take 15 mg by mouth daily.   mirabegron ER 25 MG Tb24 tablet Commonly known as: Myrbetriq Take 1 tablet (25 mg total) by mouth daily.   pantoprazole 40 MG tablet Commonly known as: PROTONIX Take 40 mg by mouth daily.   prasugrel 10 MG Tabs tablet Commonly known as: EFFIENT Take 10 mg by mouth daily.   pregabalin 75 MG capsule Commonly known as: LYRICA Take 75 mg by mouth 2 (two) times daily.   PreserVision/Lutein Caps Take 1 capsule by mouth 2 (two) times daily.   Refresh Tears 0.5 % Soln Generic drug: carboxymethylcellulose Apply 1-2 drops to eye at bedtime.   senna-docusate 8.6-50 MG tablet Commonly known as: Senokot-S Take 1 tablet by mouth at bedtime as needed for mild constipation.   simvastatin 20 MG tablet Commonly known as: ZOCOR Take 20 mg by mouth daily at 6 PM.   traZODone 50 MG tablet Commonly known as: DESYREL   venlafaxine 37.5 MG tablet Commonly known as: EFFEXOR   Verapamil HCl CR 300 MG Cp24 Take 1 capsule by mouth daily.   VITAMIN B-12 PO Take 1 tablet by mouth daily.       Allergies:  Allergies  Allergen Reactions  . Micardis [Telmisartan] Other (See Comments)    Dizziness  . Accupril [Quinapril Hcl] Rash  . Quinapril Rash     Family History: Family History  Problem Relation Age of Onset  . Breast cancer Cousin   . Skin cancer Unknown   . Diabetes Unknown   . Hypercholesterolemia Unknown   . Hypertension Unknown   . Bladder Cancer Maternal Aunt   . COPD Unknown   . Kidney disease Neg Hx   . Kidney cancer Neg Hx     Social History:  reports that she has never smoked. She has never used smokeless tobacco. She reports that she does not drink alcohol or use drugs.  ROS: UROLOGY Frequent Urination?: Yes Hard to postpone urination?: Yes Burning/pain with urination?: No Get up at night to urinate?: Yes Leakage  of urine?: No Urine stream starts and stops?: No Trouble starting stream?: No Do you have to strain to urinate?: No Blood in urine?: No Urinary tract infection?: No Sexually transmitted disease?: No Injury to kidneys or bladder?: No Painful intercourse?: No Weak stream?: No Currently pregnant?: No Vaginal bleeding?: No Last menstrual period?: n  Gastrointestinal Nausea?: Yes Vomiting?: No Indigestion/heartburn?: No Diarrhea?: No Constipation?: No  Constitutional Fever: No Night sweats?: No Weight loss?: No Fatigue?: No  Skin Skin rash/lesions?: Yes Itching?: No  Eyes Blurred vision?: No Double vision?: No  Ears/Nose/Throat Sore throat?: No Sinus problems?: No  Hematologic/Lymphatic Swollen glands?: No Easy bruising?: Yes  Cardiovascular Leg swelling?: No Chest pain?: Yes  Respiratory Cough?: Yes Shortness of breath?: No  Endocrine Excessive thirst?: No  Musculoskeletal Back pain?: Yes Joint pain?: No  Neurological Headaches?: No Dizziness?: No  Psychologic Depression?: No Anxiety?: No  Physical Exam: BP (!) 147/81   Pulse 85   Ht 5\' 1"  (1.549 m)   Wt 198 lb 1.6 oz (89.9 kg)   BMI 37.43 kg/m   Constitutional:  Well nourished. Alert and oriented, No acute distress. HEENT: [place] AT, moist mucus membranes.  Trachea midline, no masses.  Cardiovascular: No clubbing, cyanosis, or edema. Respiratory: Normal respiratory effort, no increased work of breathing. Neurologic: Grossly intact, no focal deficits, moving all 4 extremities. Psychiatric: Normal mood and affect.   Laboratory Data: Lab Results  Component Value Date   WBC 6.3 05/24/2018   HGB 11.4 (L) 05/24/2018   HCT 35.3 (L) 05/24/2018   MCV 80.2 05/24/2018   PLT 213 05/24/2018   Lab Results  Component Value Date   CREATININE 1.12 (H) 05/24/2018   Lab Results  Component Value Date   HGBA1C 7.3 (H) 04/16/2016      Component Value Date/Time   CHOL 141 06/30/2011 0551   HDL 63 (H) 06/30/2011 0551   VLDL 14 06/30/2011 0551   LDLCALC 64 06/30/2011 0551   Lab Results  Component Value Date   AST 25 04/14/2016   Lab Results  Component Value Date   ALT 17 04/14/2016   I have reviewed the labs.  Pertinent Imaging Results for KALANY, SHIPMAN (MRN [id number]) as of 03/11/2019 11:56  Ref. Range 03/11/2019 11:45  Scan Result Unknown 2    Assessment & Plan:    1. History of recurrent UTI's Asymptomatic at this visit Asked to contact office for symptoms of UTI's   2. Vaginal atrophy Not applying the vaginal estrogen cream   3. Incontinence Continue the Myrbetriq 25 mg daily - refills given RTC in one year for OAB questionnaire and PVR  4. Cystocele:   Continue to manage conservatively at this time.  We will reassess when she returns in one year.      Return in about 1 year (around 03/10/2020) for PVR and OAB questionnaire.  These notes generated with voice recognition software. I apologize for typographical errors.  Zara Council, PA-C  [facility] [address] [place], [place] [id number] [phone]

## 2019-03-11 ENCOUNTER — Encounter: Payer: Medicare Other | Admitting: *Deleted

## 2019-03-11 ENCOUNTER — Ambulatory Visit (INDEPENDENT_AMBULATORY_CARE_PROVIDER_SITE_OTHER): Payer: Medicare Other | Admitting: Urology

## 2019-03-11 ENCOUNTER — Other Ambulatory Visit: Payer: Self-pay

## 2019-03-11 ENCOUNTER — Encounter: Payer: Self-pay | Admitting: Urology

## 2019-03-11 VITALS — BP 147/81 | HR 85 | Ht 61.0 in | Wt 198.1 lb

## 2019-03-11 DIAGNOSIS — N8111 Cystocele, midline: Secondary | ICD-10-CM | POA: Diagnosis not present

## 2019-03-11 DIAGNOSIS — Z951 Presence of aortocoronary bypass graft: Secondary | ICD-10-CM | POA: Diagnosis not present

## 2019-03-11 DIAGNOSIS — N952 Postmenopausal atrophic vaginitis: Secondary | ICD-10-CM | POA: Diagnosis not present

## 2019-03-11 DIAGNOSIS — N3946 Mixed incontinence: Secondary | ICD-10-CM | POA: Diagnosis not present

## 2019-03-11 DIAGNOSIS — R351 Nocturia: Secondary | ICD-10-CM | POA: Diagnosis not present

## 2019-03-11 LAB — BLADDER SCAN AMB NON-IMAGING: Scan Result: 2

## 2019-03-11 MED ORDER — MIRABEGRON ER 25 MG PO TB24: 25.0000 mg | ORAL_TABLET | Freq: Every day | ORAL | 3 refills | Status: DC

## 2019-03-11 NOTE — Progress Notes (Signed)
Daily Session Note  Patient Details  Name: Brandi [name] MRN: [id number] Date of Birth: 07-01-1935 Referring Provider:     Cardiac Rehab from 01/27/2019 in [facility] Cardiac and Pulmonary Rehab  Referring Provider  Bartholome Bill MD      Encounter Date: 03/11/2019  Check In: Session Check In - 03/11/19 1018      Check-In   Supervising physician immediately available to respond to emergencies  See telemetry face sheet for immediately available ER MD    Location  [facility]-Cardiac & Pulmonary Rehab    Staff Present  Heath Lark, RN, BSN, CCRP;Joseph Hood RCP,RRT,BSRT;Amanda Oletta Darter, [place], ACSM CEP, Exercise Physiologist    Virtual Visit  No    Medication changes reported      No    Fall or balance concerns reported     No    Warm-up and Cool-down  Performed on first and last piece of equipment    Resistance Training Performed  Yes    VAD Patient?  No    PAD/SET Patient?  No      Pain Assessment   Currently in Pain?  No/denies          Social History   Tobacco Use  Smoking Status Never Smoker  Smokeless Tobacco Never Used    Goals Met:  Independence with exercise equipment Exercise tolerated well No report of cardiac concerns or symptoms  Goals Unmet:  Not Applicable  Comments: Pt able to follow exercise prescription today without complaint.  Will continue to monitor for progression.  Sanye stated that she had some pulling pain in her chest on Sunday.  None today. She does see her physician this week to review the symptoms. Explained most likely post surgical healing.  No weights today.   Dr. Emily Filbert is Medical Director for [place] and [facility].

## 2019-03-13 ENCOUNTER — Encounter: Payer: Medicare Other | Admitting: *Deleted

## 2019-03-13 ENCOUNTER — Other Ambulatory Visit: Payer: Self-pay

## 2019-03-13 DIAGNOSIS — Z951 Presence of aortocoronary bypass graft: Secondary | ICD-10-CM

## 2019-03-13 NOTE — Progress Notes (Signed)
Daily Session Note  Patient Details  Name: Brandi [name] MRN: [id number] Date of Birth: [birth year] Referring Provider:     Cardiac Rehab from 01/27/2019 in [facility] Cardiac and Pulmonary Rehab  Referring Provider  Bartholome Bill MD      Encounter Date: 03/13/2019  Check In: Session Check In - 03/13/19 1023      Check-In   Supervising physician immediately available to respond to emergencies  See telemetry face sheet for immediately available ER MD    Location  [facility]-Cardiac & Pulmonary Rehab    Staff Present  Jasper Loser BS, Exercise Physiologist;Ahijah Devery [place], MA, RCEP, CCRP, CCET;Amanda Sommer, BA, ACSM CEP, Exercise Physiologist;Carroll Enterkin, RN, BSN-BC, CCRP    Virtual Visit  No    Medication changes reported      Yes    Comments  started clobetasol shampoo and triamcinolone cream    Fall or balance concerns reported     No    Warm-up and Cool-down  Performed on first and last piece of equipment    Resistance Training Performed  Yes    VAD Patient?  No    PAD/SET Patient?  No      Pain Assessment   Currently in Pain?  No/denies          Social History   Tobacco Use  Smoking Status Never Smoker  Smokeless Tobacco Never Used    Goals Met:  Exercise tolerated well No report of cardiac concerns or symptoms Strength training completed today  Goals Unmet:  Not Applicable  Comments: Pt able to follow exercise prescription today without complaint.  Will continue to monitor for progression.    Dr. Emily Filbert is Medical Director for [place] and [facility].

## 2019-03-14 DIAGNOSIS — Z951 Presence of aortocoronary bypass graft: Secondary | ICD-10-CM | POA: Diagnosis not present

## 2019-03-14 NOTE — Progress Notes (Signed)
Daily Session Note  Patient Details  Name: Brandi [name] MRN: [id number] Date of Birth: 1936-02-25 Referring Provider:     Cardiac Rehab from 01/27/2019 in [facility] Cardiac and Pulmonary Rehab  Referring Provider  Bartholome Bill MD      Encounter Date: 03/14/2019  Check In: Session Check In - 03/14/19 1014      Check-In   Supervising physician immediately available to respond to emergencies  See telemetry face sheet for immediately available ER MD    Location  [facility]-Cardiac & Pulmonary Rehab    Staff Present  Vida Rigger RN, Vickki Hearing, BA, ACSM CEP, Exercise Physiologist;Jessica [place], MA, RCEP, CCRP, CCET;Joseph [place]    Virtual Visit  No    Medication changes reported      No    Fall or balance concerns reported     No    Warm-up and Cool-down  Performed on first and last piece of equipment    Resistance Training Performed  Yes    VAD Patient?  No    PAD/SET Patient?  No      Pain Assessment   Currently in Pain?  No/denies    Multiple Pain Sites  No          Social History   Tobacco Use  Smoking Status Never Smoker  Smokeless Tobacco Never Used    Goals Met:  Exercise tolerated well No report of cardiac concerns or symptoms Strength training completed today  Goals Unmet:  Not Applicable  Comments: Pt able to follow exercise prescription today without complaint.  Will continue to monitor for progression.   Dr. Emily Filbert is Medical Director for [place] and [facility].

## 2019-03-17 ENCOUNTER — Ambulatory Visit
Admission: RE | Admit: 2019-03-17 | Discharge: 2019-03-17 | Disposition: A | Discharge status: Home / Self Care | Payer: Medicare Other | Source: Ambulatory Visit | Attending: Internal Medicine | Admitting: Internal Medicine

## 2019-03-17 DIAGNOSIS — Z1231 Encounter for screening mammogram for malignant neoplasm of breast: Secondary | ICD-10-CM | POA: Diagnosis present

## 2019-03-18 ENCOUNTER — Other Ambulatory Visit: Payer: Self-pay

## 2019-03-18 ENCOUNTER — Encounter: Payer: Medicare Other | Admitting: *Deleted

## 2019-03-18 DIAGNOSIS — Z951 Presence of aortocoronary bypass graft: Secondary | ICD-10-CM

## 2019-03-18 NOTE — Progress Notes (Signed)
Daily Session Note  Patient Details  Name: Brandi [name] MRN: [id number] Date of Birth: 1935-08-27 Referring Provider:     Cardiac Rehab from 01/27/2019 in [facility] Cardiac and Pulmonary Rehab  Referring Provider  Bartholome Bill MD      Encounter Date: 03/18/2019  Check In: Session Check In - 03/18/19 1045      Check-In   Supervising physician immediately available to respond to emergencies  See telemetry face sheet for immediately available ER MD    Location  [facility]-Cardiac & Pulmonary Rehab    Staff Present  Darel Hong, RN Vickki Hearing, BA, ACSM CEP, Exercise Physiologist;Diane Joya Gaskins RN,BSN;Joseph Tessie Fass RCP,RRT,BSRT    Virtual Visit  No    Medication changes reported      No    Fall or balance concerns reported     No    Warm-up and Cool-down  Performed on first and last piece of equipment    Resistance Training Performed  Yes    VAD Patient?  No    PAD/SET Patient?  No      Pain Assessment   Currently in Pain?  No/denies          Social History   Tobacco Use  Smoking Status Never Smoker  Smokeless Tobacco Never Used    Goals Met:  Independence with exercise equipment Exercise tolerated well No report of cardiac concerns or symptoms Strength training completed today  Goals Unmet:  Not Applicable  Comments: Pt able to follow exercise prescription today without complaint.  Will continue to monitor for progression.    Dr. Emily Filbert is Medical Director for [place] and [facility].

## 2019-03-20 ENCOUNTER — Other Ambulatory Visit: Payer: Self-pay

## 2019-03-20 ENCOUNTER — Encounter: Payer: Medicare Other | Attending: Cardiology | Admitting: *Deleted

## 2019-03-20 DIAGNOSIS — E785 Hyperlipidemia, unspecified: Secondary | ICD-10-CM | POA: Insufficient documentation

## 2019-03-20 DIAGNOSIS — K219 Gastro-esophageal reflux disease without esophagitis: Secondary | ICD-10-CM | POA: Diagnosis not present

## 2019-03-20 DIAGNOSIS — Z79899 Other long term (current) drug therapy: Secondary | ICD-10-CM | POA: Diagnosis not present

## 2019-03-20 DIAGNOSIS — M1990 Unspecified osteoarthritis, unspecified site: Secondary | ICD-10-CM | POA: Diagnosis not present

## 2019-03-20 DIAGNOSIS — F419 Anxiety disorder, unspecified: Secondary | ICD-10-CM | POA: Insufficient documentation

## 2019-03-20 DIAGNOSIS — E669 Obesity, unspecified: Secondary | ICD-10-CM | POA: Insufficient documentation

## 2019-03-20 DIAGNOSIS — Z951 Presence of aortocoronary bypass graft: Secondary | ICD-10-CM | POA: Insufficient documentation

## 2019-03-20 DIAGNOSIS — E119 Type 2 diabetes mellitus without complications: Secondary | ICD-10-CM | POA: Diagnosis not present

## 2019-03-20 DIAGNOSIS — Z7982 Long term (current) use of aspirin: Secondary | ICD-10-CM | POA: Insufficient documentation

## 2019-03-20 DIAGNOSIS — Z791 Long term (current) use of non-steroidal anti-inflammatories (NSAID): Secondary | ICD-10-CM | POA: Diagnosis not present

## 2019-03-20 DIAGNOSIS — Z7902 Long term (current) use of antithrombotics/antiplatelets: Secondary | ICD-10-CM | POA: Diagnosis not present

## 2019-03-20 DIAGNOSIS — F329 Major depressive disorder, single episode, unspecified: Secondary | ICD-10-CM | POA: Insufficient documentation

## 2019-03-20 DIAGNOSIS — G4730 Sleep apnea, unspecified: Secondary | ICD-10-CM | POA: Diagnosis not present

## 2019-03-20 DIAGNOSIS — Z794 Long term (current) use of insulin: Secondary | ICD-10-CM | POA: Insufficient documentation

## 2019-03-20 NOTE — Progress Notes (Signed)
Daily Session Note  Patient Details  Name: Brandi [name] MRN: [id number] Date of Birth: 04-27-1936 Referring Provider:     Cardiac Rehab from 01/27/2019 in [facility] Cardiac and Pulmonary Rehab  Referring Provider  Bartholome Bill MD      Encounter Date: 03/20/2019  Check In: Session Check In - 03/20/19 1019      Check-In   Supervising physician immediately available to respond to emergencies  See telemetry face sheet for immediately available ER MD    Location  [facility]-Cardiac & Pulmonary Rehab    Staff Present  Renita Papa, RN BSN;Jessica [place], MA, RCEP, CCRP, CCET;Amanda Sommer, [place], ACSM CEP, Exercise Physiologist    Virtual Visit  No    Medication changes reported      No    Fall or balance concerns reported     No    Warm-up and Cool-down  Performed on first and last piece of equipment    Resistance Training Performed  Yes    VAD Patient?  No    PAD/SET Patient?  No      Pain Assessment   Currently in Pain?  No/denies          Social History   Tobacco Use  Smoking Status Never Smoker  Smokeless Tobacco Never Used    Goals Met:  Independence with exercise equipment Exercise tolerated well No report of cardiac concerns or symptoms Strength training completed today  Goals Unmet:  Not Applicable  Comments: Pt able to follow exercise prescription today without complaint.  Will continue to monitor for progression.    Dr. Emily Filbert is Medical Director for [place] [facility].

## 2019-03-26 ENCOUNTER — Encounter: Payer: Self-pay | Admitting: *Deleted

## 2019-03-26 DIAGNOSIS — Z951 Presence of aortocoronary bypass graft: Secondary | ICD-10-CM

## 2019-03-26 NOTE — Progress Notes (Signed)
Cardiac Individual Treatment Plan  Patient Details  Name: Brandi [name] MRN: [id number] Date of Birth: September 19, 1935 Referring Provider:     Cardiac Rehab from 01/27/2019 in [facility] Cardiac and Pulmonary Rehab  Referring Provider  Bartholome Bill MD      Initial Encounter Date:    Cardiac Rehab from 01/27/2019 in [facility] Cardiac and Pulmonary Rehab  Date  01/27/19      Visit Diagnosis: S/P CABG x 3  Patient's Home Medications on Admission:  Current Outpatient Medications:  .  acetaminophen (TYLENOL) 500 MG tablet, Take by mouth., Disp: , Rfl:  .  albuterol (VENTOLIN HFA) 108 (90 Base) MCG/ACT inhaler, , Disp: , Rfl:  .  aspirin EC 81 MG tablet, Take 81 mg by mouth daily., Disp: , Rfl:  .  atorvastatin (LIPITOR) 40 MG tablet, Take by mouth., Disp: , Rfl:  .  budesonide-formoterol (SYMBICORT) 80-4.5 MCG/ACT inhaler, Inhale 2 puffs into the lungs 2 (two) times daily., Disp: , Rfl:  .  Calcium Carb-Cholecalciferol (CALCIUM + D3 PO), Take 1 tablet by mouth daily., Disp: , Rfl:  .  carboxymethylcellulose (REFRESH TEARS) 0.5 % SOLN, Apply 1-2 drops to eye at bedtime. , Disp: , Rfl:  .  carvedilol (COREG) 6.25 MG tablet, Take 6.25 mg by mouth 2 (two) times daily with a meal. , Disp: , Rfl:  .  clopidogrel (PLAVIX) 75 MG tablet, Take 75 mg by mouth daily., Disp: , Rfl:  .  conjugated estrogens (PREMARIN) vaginal cream, Place 1 Applicatorful vaginally daily. Apply 0.71m (pea-sized amount)  just inside the vaginal introitus with a finger-tip every night for two weeks and then Monday, Wednesday and Friday nights. (Patient not taking: Reported on 01/23/2019), Disp: 30 g, Rfl: 12 .  Cyanocobalamin (VITAMIN B-12 PO), Take 1 tablet by mouth daily., Disp: , Rfl:  .  gabapentin (NEURONTIN) 100 MG capsule, , Disp: , Rfl:  .  glipiZIDE (GLUCOTROL) 5 MG tablet, Take 5 mg by mouth 2 (two) times daily. , Disp: , Rfl:  .  hydrochlorothiazide (HYDRODIURIL) 12.5 MG tablet, , Disp: , Rfl: 1 .  Insulin Glargine (LANTUS  SOLOSTAR) 100 UNIT/ML Solostar Pen, Inject 12 Units into the skin at bedtime. , Disp: , Rfl:  .  JANUMET XR 50-1000 MG TB24, Take 1 tablet by mouth 2 (two) times daily., Disp: , Rfl:  .  losartan (COZAAR) 100 MG tablet, Take 100 mg by mouth daily., Disp: , Rfl:  .  meloxicam (MOBIC) 15 MG tablet, Take 15 mg by mouth daily., Disp: , Rfl:  .  mirabegron ER (MYRBETRIQ) 25 MG TB24 tablet, Take 1 tablet (25 mg total) by mouth daily., Disp: 90 tablet, Rfl: 3 .  Multiple Vitamins-Minerals (PRESERVISION/LUTEIN) CAPS, Take 1 capsule by mouth 2 (two) times daily. , Disp: , Rfl:  .  pantoprazole (PROTONIX) 40 MG tablet, Take 40 mg by mouth daily., Disp: , Rfl:  .  prasugrel (EFFIENT) 10 MG TABS tablet, Take 10 mg by mouth daily., Disp: , Rfl:  .  pregabalin (LYRICA) 75 MG capsule, Take 75 mg by mouth 2 (two) times daily., Disp: , Rfl:  .  senna-docusate (SENOKOT-S) 8.6-50 MG tablet, Take 1 tablet by mouth at bedtime as needed for mild constipation., Disp: 10 tablet, Rfl: 0 .  simvastatin (ZOCOR) 20 MG tablet, Take 20 mg by mouth daily at 6 PM. , Disp: , Rfl:  .  SitaGLIPtin-MetFORMIN HCl (JANUMET XR) 50-1000 MG TB24, Take 1 tablet by mouth 2 (two) times daily., Disp: , Rfl:  .  sodium chloride (ALTAMIST SPRAY) 0.65 % nasal spray, Place into the nose., Disp: , Rfl:  .  traZODone (DESYREL) 50 MG tablet, , Disp: , Rfl:  .  venlafaxine (EFFEXOR) 37.5 MG tablet, , Disp: , Rfl:  .  Verapamil HCl CR 300 MG CP24, Take 1 capsule by mouth daily., Disp: , Rfl:   Past Medical History: Past Medical History:  Diagnosis Date  . Anemia   . Anxiety   . Arthritis, degenerative   . Chronic cystitis   . Depression   . Diabetes mellitus without complication ([place])   . GERD (gastroesophageal reflux disease)   . Headache   . Heart disease   . HLD (hyperlipidemia)   . HTN (hypertension)   . Hypertension   . Incontinence in female   . Murmur   . Obesity   . Sleep apnea   . Urge incontinence of urine     Tobacco  Use: Social History   Tobacco Use  Smoking Status Never Smoker  Smokeless Tobacco Never Used    Labs: Recent Review Flowsheet Data    Labs for ITP Cardiac and Pulmonary Rehab Latest Ref Rng & Units 06/30/2011 04/16/2016   Cholestrol 0 - 200 mg/dL 141 -   LDLCALC 0 - 100 mg/dL 64 -   HDL 40 - 60 mg/dL 63(H) -   Trlycerides 0 - 200 mg/dL 71 -   Hemoglobin A1c 4.8 - 5.6 % 7.7(H) 7.3(H)       Exercise Target Goals: Exercise Program Goal: Individual exercise prescription set using results from initial 6 min walk test and THRR while considering  patient's activity barriers and safety.   Exercise Prescription Goal: Initial exercise prescription builds to 30-45 minutes a day of aerobic activity, 2-3 days per week.  Home exercise guidelines will be given to patient during program as part of exercise prescription that the participant will acknowledge.  Activity Barriers & Risk Stratification: Activity Barriers & Cardiac Risk Stratification - 01/27/19 1613      Activity Barriers & Cardiac Risk Stratification   Activity Barriers  Back Problems;Arthritis;Joint Problems;Deconditioning;Balance Concerns;Muscular Weakness;Shortness of Breath;Assistive Device;History of Falls    Cardiac Risk Stratification  High       6 Minute Walk: 6 Minute Walk    Row Name 01/27/19 1612         6 Minute Walk   Phase  Initial     Distance  620 feet     Walk Time  5.5 minutes     # of Rest Breaks  0     MPH  1.28     METS  1.15     RPE  17     Perceived Dyspnea   2     VO2 Peak  4.02     Symptoms  Yes (comment)     Comments  winded, fatigue     Resting HR  81 bpm     Resting BP  142/74     Resting Oxygen Saturation   97 %     Exercise Oxygen Saturation  during 6 min walk  96 %     Max Ex. HR  134 bpm     Max Ex. BP  156/76     2 Minute Post BP  142/70        Oxygen Initial Assessment:   Oxygen Re-Evaluation:   Oxygen Discharge (Final Oxygen Re-Evaluation):   Initial Exercise  Prescription: Initial Exercise Prescription - 01/27/19 1600      Date of  Initial Exercise RX and Referring Provider   Date  01/27/19    Referring Provider  Bartholome Bill MD      Recumbant Bike   Level  1    RPM  50    Watts  5    Minutes  15    METs  1.1      NuStep   Level  1    SPM  80    Minutes  15    METs  1      Arm Ergometer   Level  1    RPM  25    Minutes  15    METs  1      Recumbant Elliptical   Level  1    RPM  50    Minutes  15    METs  1      REL-XR   Level  1    Speed  50    Minutes  15    METs  1.2      T5 Nustep   Level  1    SPM  80    Minutes  15    METs  1      Biostep-RELP   Level  1    SPM  50    Minutes  15    METs  1      Prescription Details   Frequency (times per week)  3    Duration  Progress to 30 minutes of continuous aerobic without signs/symptoms of physical distress      Intensity   THRR 40-80% of Max Heartrate  105-128    Ratings of Perceived Exertion  11-13    Perceived Dyspnea  0-4      Progression   Progression  Continue to progress workloads to maintain intensity without signs/symptoms of physical distress.      Resistance Training   Training Prescription  Yes    Weight  2 lbs    Reps  10-15       Perform Capillary Blood Glucose checks as needed.  Exercise Prescription Changes: Exercise Prescription Changes    Row Name 01/27/19 1600 02/06/19 1500 02/18/19 1200 03/04/19 1400 03/19/19 1300     Response to Exercise   Blood Pressure (Admit)  142/74  122/72  124/80  122/70  154/80   Blood Pressure (Exercise)  156/76  148/74  136/80  138/70  160/84   Blood Pressure (Exit)  142/70  128/88  130/80  138/74  150/80   Heart Rate (Admit)  81 bpm  76 bpm  83 bpm  80 bpm  88 bpm   Heart Rate (Exercise)  134 bpm  111 bpm  112 bpm  95 bpm  114 bpm   Heart Rate (Exit)  83 bpm  84 bpm  76 bpm  48 bpm  70 bpm   Oxygen Saturation (Admit)  97 %  -  -  -  -   Oxygen Saturation (Exercise)  96 %  -  -  -  -   Rating of  Perceived Exertion (Exercise)  '17  15  13  13  13   '$ Perceived Dyspnea (Exercise)  2  -  -  -  -   Symptoms  winded, fatigue  fatigue  fatigue  nausea  -   Comments  walk test results  -  -  -  -   Duration  -  Continue with 30 min of aerobic exercise without signs/symptoms  of physical distress.  Continue with 30 min of aerobic exercise without signs/symptoms of physical distress.  Continue with 30 min of aerobic exercise without signs/symptoms of physical distress.  Continue with 30 min of aerobic exercise without signs/symptoms of physical distress.   Intensity  -  THRR unchanged  THRR unchanged  THRR unchanged  THRR unchanged     Progression   Progression  -  Continue to progress workloads to maintain intensity without signs/symptoms of physical distress.  Continue to progress workloads to maintain intensity without signs/symptoms of physical distress.  Continue to progress workloads to maintain intensity without signs/symptoms of physical distress.  Continue to progress workloads to maintain intensity without signs/symptoms of physical distress.   Average METs  -  1.57  1.27  1.6  1.95     Resistance Training   Training Prescription  -  Yes  Yes  Yes  Yes   Weight  -  3 lbs  3 lbs  3 lbs  3 lb   Reps  -  10-15  10-15  10-15  10-15     Interval Training   Interval Training  -  No  No  No  No     NuStep   Level  -  -  '1  2  2   '$ Minutes  -  -  '15  15  25   '$ METs  -  -  1.8  1.6  1.9     Arm Ergometer   Level  -  1  1  -  -   Minutes  -  15  15  -  -   METs  -  1.2  1  -  -     Recumbant Elliptical   Level  -  -  -  -  - hurt arms - stayed on T4     REL-XR   Level  -  1  -  1  -   Minutes  -  15  -  15  -   METs  -  1  -  -  -     T5 Nustep   Level  -  1  -  -  -   Minutes  -  15  -  -  -   METs  -  1.7  -  -  -     Biostep-RELP   Level  -  1  1  -  -   Minutes  -  15  15  -  -   METs  -  2  1  -  -     Home Exercise Plan   Plans to continue exercise at  -  -  Home  (comment) walking  Home (comment) walking  Home (comment) walking   Frequency  -  -  Add 1 additional day to program exercise sessions.  Add 1 additional day to program exercise sessions.  Add 1 additional day to program exercise sessions.   Initial Home Exercises Provided  -  -  02/14/19  02/14/19  02/14/19      Exercise Comments: Exercise Comments    Row Name 01/28/19 1026 02/07/19 1018 02/20/19 1153 03/11/19 1114     Exercise Comments  First full day of exercise!  Patient was oriented to gym and equipment including functions, settings, policies, and procedures.  Patient's individual exercise prescription and treatment plan were reviewed.  All starting workloads were established based on the  results of the 6 minute walk test done at initial orientation visit.  The plan for exercise progression was also introduced and progression will be customized based on patient's performance and goals.  Weight up 2 pounds- some SOB noted by [name].  Advised call MD/ED if gets worse  Karigan has complaint of nausea and had some clear emesis today. After talking with her-this has been a recurrent problem. She was advised to call her PMD and review her persistent symptoms.  Starlene stated that she had some pulling pain in her chest on Sunday.  None today. She does see her physician this week to review the symptoms. Explained most likely post surgical healing.  No weights today.       Exercise Goals and Review: Exercise Goals    Row Name 01/27/19 1617             Exercise Goals   Increase Physical Activity  Yes       Intervention  Provide advice, education, support and counseling about physical activity/exercise needs.;Develop an individualized exercise prescription for aerobic and resistive training based on initial evaluation findings, risk stratification, comorbidities and participant's personal goals.       Expected Outcomes  Short Term: Attend rehab on a regular basis to increase amount of physical  activity.;Long Term: Add in home exercise to make exercise part of routine and to increase amount of physical activity.;Long Term: Exercising regularly at least 3-5 days a week.       Increase Strength and Stamina  Yes       Intervention  Provide advice, education, support and counseling about physical activity/exercise needs.;Develop an individualized exercise prescription for aerobic and resistive training based on initial evaluation findings, risk stratification, comorbidities and participant's personal goals.       Expected Outcomes  Short Term: Increase workloads from initial exercise prescription for resistance, speed, and METs.;Short Term: Perform resistance training exercises routinely during rehab and add in resistance training at home;Long Term: Improve cardiorespiratory fitness, muscular endurance and strength as measured by increased METs and functional capacity (6MWT)       Able to understand and use rate of perceived exertion (RPE) scale  Yes       Intervention  Provide education and explanation on how to use RPE scale       Expected Outcomes  Long Term:  Able to use RPE to guide intensity level when exercising independently;Short Term: Able to use RPE daily in rehab to express subjective intensity level       Able to understand and use Dyspnea scale  Yes       Intervention  Provide education and explanation on how to use Dyspnea scale       Expected Outcomes  Short Term: Able to use Dyspnea scale daily in rehab to express subjective sense of shortness of breath during exertion;Long Term: Able to use Dyspnea scale to guide intensity level when exercising independently       Knowledge and understanding of Target Heart Rate Range (THRR)  Yes       Intervention  Provide education and explanation of THRR including how the numbers were predicted and where they are located for reference       Expected Outcomes  Short Term: Able to state/look up THRR;Short Term: Able to use daily as guideline for  intensity in rehab;Long Term: Able to use THRR to govern intensity when exercising independently       Able to check pulse independently  Yes  Intervention  Provide education and demonstration on how to check pulse in carotid and radial arteries.;Review the importance of being able to check your own pulse for safety during independent exercise       Expected Outcomes  Short Term: Able to explain why pulse checking is important during independent exercise;Long Term: Able to check pulse independently and accurately       Understanding of Exercise Prescription  Yes       Intervention  Provide education, explanation, and written materials on patient's individual exercise prescription       Expected Outcomes  Short Term: Able to explain program exercise prescription;Long Term: Able to explain home exercise prescription to exercise independently          Exercise Goals Re-Evaluation : Exercise Goals Re-Evaluation    Row Name 01/28/19 1028 02/06/19 1541 02/14/19 1032 02/18/19 1253 03/04/19 1341     Exercise Goal Re-Evaluation   Exercise Goals Review  Increase Physical Activity;Increase Strength and Stamina;Able to understand and use rate of perceived exertion (RPE) scale;Able to understand and use Dyspnea scale;Knowledge and understanding of Target Heart Rate Range (THRR);Understanding of Exercise Prescription  Increase Physical Activity;Increase Strength and Stamina;Understanding of Exercise Prescription  Increase Physical Activity;Increase Strength and Stamina;Able to understand and use rate of perceived exertion (RPE) scale;Knowledge and understanding of Target Heart Rate Range (THRR);Able to check pulse independently  Increase Physical Activity;Increase Strength and Stamina;Able to understand and use rate of perceived exertion (RPE) scale;Knowledge and understanding of Target Heart Rate Range (THRR);Able to check pulse independently  -   Comments  Reviewed RPE scale, THR and program prescription  with pt today.  Pt voiced understanding and was given a copy of goals to take home.  Jerzi Tigert is off to a good start in rehab.  She getting in all 30 of her minutes much to her surprise.  We will continue to monitor her progress.  Guliana Weyandt has a stationary bike at home.  She plans to start by adding one day a week in addition to HT sessions.  Lissett Favorite is doing well in rehab.  She is now on level 2 on the NuStep.  We will continue to monitor her progress.  Joceline Hinchcliff continues to do well in rehab.  She had an epsiode last week of nausea in class and was not able to go continuously like she had been doing.  She is doing well on the NuStep.  We will continue to monitor her progress.   Expected Outcomes  Short: Use RPE daily to regulate intensity. Long: Follow program prescription in THR.  Short: Continue to attend class regulalry.  Long: Continue to follow program prescription.  Short - add one exrecise session in addition to HT  Short: Continue to increase speed on the BioStep.  Long: Continue to add in exercise at home on off days.  Short: Continue to increase workloads.  Long: Continue to exercise at home.   [place] Name 03/19/19 1340             Exercise Goal Re-Evaluation   Exercise Goals Review  Increase Physical Activity;Increase Strength and Stamina;Able to understand and use rate of perceived exertion (RPE) scale;Knowledge and understanding of Target Heart Rate Range (THRR);Able to check pulse independently;Understanding of Exercise Prescription       Comments  Reannah attends consistently.  She has had less nausea since starting a new med from her Dr.       Noberto Retort Outcomes  Short - continue to attend  consistently Long - maintain regular exercise routine          Discharge Exercise Prescription (Final Exercise Prescription Changes): Exercise Prescription Changes - 03/19/19 1300      Response to Exercise   Blood Pressure (Admit)  154/80    Blood Pressure (Exercise)  160/84    Blood Pressure  (Exit)  150/80    Heart Rate (Admit)  88 bpm    Heart Rate (Exercise)  114 bpm    Heart Rate (Exit)  70 bpm    Rating of Perceived Exertion (Exercise)  13    Duration  Continue with 30 min of aerobic exercise without signs/symptoms of physical distress.    Intensity  THRR unchanged      Progression   Progression  Continue to progress workloads to maintain intensity without signs/symptoms of physical distress.    Average METs  1.95      Resistance Training   Training Prescription  Yes    Weight  3 lb    Reps  10-15      Interval Training   Interval Training  No      NuStep   Level  2    Minutes  25    METs  1.9      Recumbant Elliptical   Level  --   hurt arms - stayed on T4     Home Exercise Plan   Plans to continue exercise at  Home (comment)   walking   Frequency  Add 1 additional day to program exercise sessions.    Initial Home Exercises Provided  02/14/19       Nutrition:  Target Goals: Understanding of nutrition guidelines, daily intake of sodium '1500mg'$ , cholesterol '200mg'$ , calories 30% from fat and 7% or less from saturated fats, daily to have 5 or more servings of fruits and vegetables.  Biometrics: Pre Biometrics - 01/27/19 1617      Pre Biometrics   Height  '5\' 1"'$  (8.101 m)    Weight  198 lb 6.4 oz (90 kg)    BMI (Calculated)  37.51    Single Leg Stand  0.37 seconds        Nutrition Therapy Plan and Nutrition Goals: Nutrition Therapy & Goals - 01/27/19 1526      Nutrition Therapy   Diet  Low Na, HH, DM diet    Drug/Food Interactions  Statins/Certain Fruits   lipitor   Protein (specify units)  75g    Fiber  25 grams    Whole Grain Foods  3 servings    Saturated Fats  12 max. grams    Fruits and Vegetables  5 servings/day    Sodium  1.5 grams      Personal Nutrition Goals   Nutrition Goal  ST: increase vegetable intake (+1 serving 3x/week) LT: walk again and get rid of cane.    Comments  Lantus, janumet, glipizidesenokot, coregCa + D3 A1c  10/2018 was 6.7 (WNL). Pt reports for B she eats grits, eggos, special K or cinnamon cheerios with whole milk. L tomato sandwich with full fat mayo and white bread. Dinner is chicken with the skin and/or bacon, baked pork chops and instant potatoes or white pasta. Pt will use margarine, high fat protein, whole fat dairy, fried foods at least 2x/week, minimal F and V. Pt suggested she needed to eat more vegetables, but would still like to review the content. Discussed HH DM eating.      Intervention Plan   Intervention  Prescribe,  educate and counsel regarding individualized specific dietary modifications aiming towards targeted core components such as weight, hypertension, lipid management, diabetes, heart failure and other comorbidities.;Nutrition handout(s) given to patient.    Expected Outcomes  Short Term Goal: Understand basic principles of dietary content, such as calories, fat, sodium, cholesterol and nutrients.;Long Term Goal: Adherence to prescribed nutrition plan.;Short Term Goal: A plan has been developed with personal nutrition goals set during dietitian appointment.       Nutrition Assessments: Nutrition Assessments - 01/27/19 1542      MEDFICTS Scores   Pre Score  100       Nutrition Goals Re-Evaluation: Nutrition Goals Re-Evaluation    [place] Name 02/11/19 1034 03/11/19 1044           Goals   Current Weight  198 lb 6.4 oz (90 kg)  -      Nutrition Goal  ST: increase vegetable intake (+1 serving everyday/week) LT: walk again and get rid of cane.  ST: Try to eat broccoli 2x week LT: walk again and get rid of cane.      Comment  Pt reports now eating lima beans and corn 3x/week, now moving to everyday for next goal. Discussed the importance of variety in the diet and in vegetables. Pt asked about saturated vs unsaturated fat, discussed what they are and what some healthy swaps would be.  Pt reports now eating lima beans and corn lunch and dinner. Discussed the importance of variety  in the diet and in vegetables. pt reports not liking greens but likes broccoli but hasnt had it in a while.      Expected Outcome  ST: increase vegetable intake (+1 serving everyday/week) LT: walk again and get rid of cane.  ST: Try to eat broccoli 2x week LT: walk again and get rid of cane.         Nutrition Goals Discharge (Final Nutrition Goals Re-Evaluation): Nutrition Goals Re-Evaluation - 03/11/19 1044      Goals   Nutrition Goal  ST: Try to eat broccoli 2x week LT: walk again and get rid of cane.    Comment  Pt reports now eating lima beans and corn lunch and dinner. Discussed the importance of variety in the diet and in vegetables. pt reports not liking greens but likes broccoli but hasnt had it in a while.    Expected Outcome  ST: Try to eat broccoli 2x week LT: walk again and get rid of cane.       Psychosocial: Target Goals: Acknowledge presence or absence of significant depression and/or stress, maximize coping skills, provide positive support system. Participant is able to verbalize types and ability to use techniques and skills needed for reducing stress and depression.   Initial Review & Psychosocial Screening: Initial Psych Review & Screening - 01/23/19 1151      Initial Review   Current issues with  Current Sleep Concerns;Current Stress Concerns    Comments  Toriann is trying to regulate her sleep still from her admission in December. She is taking trazodone, but is still drowsy most of the morning so she is trying to slowly come off of it. She reported her only source of stress right now is Covid. She lives with 2 of her children, so they have been making sure she has everything she needs. She has not been out much since March.      Family Dynamics   Good Support System?  Yes      Barriers  Psychosocial barriers to participate in program  There are no identifiable barriers or psychosocial needs.;The patient should benefit from training in stress management and  relaxation.      Screening Interventions   Interventions  Encouraged to exercise;Provide feedback about the scores to participant    Expected Outcomes  Short Term goal: Utilizing psychosocial counselor, staff and physician to assist with identification of specific Stressors or current issues interfering with healing process. Setting desired goal for each stressor or current issue identified.;Long Term Goal: Stressors or current issues are controlled or eliminated.;Short Term goal: Identification and review with participant of any Quality of Life or Depression concerns found by scoring the questionnaire.;Long Term goal: The participant improves quality of Life and PHQ9 Scores as seen by post scores and/or verbalization of changes       Quality of Life Scores:  Quality of Life - 01/27/19 1618      Quality of Life   Select  Quality of Life      Quality of Life Scores   Health/Function Pre  17.58 %    Socioeconomic Pre  20.86 %    Psych/Spiritual Pre  20 %    Family Pre  23.63 %    GLOBAL Pre  19.72 %      Scores of 19 and below usually indicate a poorer quality of life in these areas.  A difference of  2-3 points is a clinically meaningful difference.  A difference of 2-3 points in the total score of the Quality of Life Index has been associated with significant improvement in overall quality of life, self-image, physical symptoms, and general health in studies assessing change in quality of life.  PHQ-9: Recent Review Flowsheet Data    Depression screen [facility] 2/9 02/27/2019 01/27/2019   Decreased Interest 0 1   Down, Depressed, Hopeless 0 1   PHQ - 2 Score 0 2   Altered sleeping 0 2   Tired, decreased energy 1 1   Change in appetite 1 2   Feeling bad or failure about yourself  0 1   Trouble concentrating 0 0   Moving slowly or fidgety/restless 1 0   Suicidal thoughts 0 0   PHQ-9 Score 3 8   Difficult doing work/chores Not difficult at all Somewhat difficult     Interpretation of  Total Score  Total Score Depression Severity:  1-4 = Minimal depression, 5-9 = Mild depression, 10-14 = Moderate depression, 15-19 = Moderately severe depression, 20-27 = Severe depression   Psychosocial Evaluation and Intervention:   Psychosocial Re-Evaluation: Psychosocial Re-Evaluation    [place] Name 03/07/19 1029             Psychosocial Re-Evaluation   Current issues with  Current Sleep Concerns;History of Depression;Current Stress Concerns       Comments  She had nausea last night whick did not help her sleeping routine. She is able to sleep with her sleeping medication but is groggy in the morning. She is going to the doctors next week to talk about cutting back on her medications to help her to not be so groggy.       Expected Outcomes  Short: talk with her doctor about sleeping medications. Long: maintain a good sleeping routine.       Interventions  Encouraged to attend Cardiac Rehabilitation for the exercise       Continue Psychosocial Services   Follow up required by staff          Psychosocial Discharge (Final  Psychosocial Re-Evaluation): Psychosocial Re-Evaluation - 03/07/19 1029      Psychosocial Re-Evaluation   Current issues with  Current Sleep Concerns;History of Depression;Current Stress Concerns    Comments  She had nausea last night whick did not help her sleeping routine. She is able to sleep with her sleeping medication but is groggy in the morning. She is going to the doctors next week to talk about cutting back on her medications to help her to not be so groggy.    Expected Outcomes  Short: talk with her doctor about sleeping medications. Long: maintain a good sleeping routine.    Interventions  Encouraged to attend Cardiac Rehabilitation for the exercise    Continue Psychosocial Services   Follow up required by staff       Vocational Rehabilitation: Provide vocational rehab assistance to qualifying candidates.   Vocational Rehab Evaluation &  Intervention: Vocational Rehab - 01/23/19 1143      Initial Vocational Rehab Evaluation & Intervention   Assessment shows need for Vocational Rehabilitation  No       Education: Education Goals: Education classes will be provided on a variety of topics geared toward better understanding of heart health and risk factor modification. Participant will state understanding/return demonstration of topics presented as noted by education test scores.  Learning Barriers/Preferences: Learning Barriers/Preferences - 01/23/19 1143      Learning Barriers/Preferences   Learning Barriers  None    Learning Preferences  Individual Instruction       Education Topics:  AED/CPR: - Group verbal and written instruction with the use of models to demonstrate the basic use of the AED with the basic ABC's of resuscitation.   General Nutrition Guidelines/Fats and Fiber: -Group instruction provided by verbal, written material, models and posters to present the general guidelines for heart healthy nutrition. Gives an explanation and review of dietary fats and fiber.   Controlling Sodium/Reading Food Labels: -Group verbal and written material supporting the discussion of sodium use in heart healthy nutrition. Review and explanation with models, verbal and written materials for utilization of the food label.   Exercise Physiology & General Exercise Guidelines: - Group verbal and written instruction with models to review the exercise physiology of the cardiovascular system and associated critical values. Provides general exercise guidelines with specific guidelines to those with heart or lung disease.    Aerobic Exercise & Resistance Training: - Gives group verbal and written instruction on the various components of exercise. Focuses on aerobic and resistive training programs and the benefits of this training and how to safely progress through these programs..   Flexibility, Balance, Mind/Body  Relaxation: Provides group verbal/written instruction on the benefits of flexibility and balance training, including mind/body exercise modes such as yoga, pilates and tai chi.  Demonstration and skill practice provided.   Stress and Anxiety: - Provides group verbal and written instruction about the health risks of elevated stress and causes of high stress.  Discuss the correlation between heart/lung disease and anxiety and treatment options. Review healthy ways to manage with stress and anxiety.   Depression: - Provides group verbal and written instruction on the correlation between heart/lung disease and depressed mood, treatment options, and the stigmas associated with seeking treatment.   Anatomy & Physiology of the Heart: - Group verbal and written instruction and models provide basic cardiac anatomy and physiology, with the coronary electrical and arterial systems. Review of Valvular disease and Heart Failure   Cardiac Procedures: - Group verbal and written instruction to review  commonly prescribed medications for heart disease. Reviews the medication, class of the drug, and side effects. Includes the steps to properly store meds and maintain the prescription regimen. (beta blockers and nitrates)   Cardiac Medications I: - Group verbal and written instruction to review commonly prescribed medications for heart disease. Reviews the medication, class of the drug, and side effects. Includes the steps to properly store meds and maintain the prescription regimen.   Cardiac Medications II: -Group verbal and written instruction to review commonly prescribed medications for heart disease. Reviews the medication, class of the drug, and side effects. (all other drug classes)    Go Sex-Intimacy & Heart Disease, Get SMART - Goal Setting: - Group verbal and written instruction through game format to discuss heart disease and the return to sexual intimacy. Provides group verbal and written  material to discuss and apply goal setting through the application of the S.M.A.R.T. Method.   Other Matters of the Heart: - Provides group verbal, written materials and models to describe Stable Angina and Peripheral Artery. Includes description of the disease process and treatment options available to the cardiac patient.   Exercise & Equipment Safety: - Individual verbal instruction and demonstration of equipment use and safety with use of the equipment.   Cardiac Rehab from 01/27/2019 in [facility] Cardiac and Pulmonary Rehab  Date  01/27/19  Educator  [facility]  Instruction Review Code  1- Verbalizes Understanding      Infection Prevention: - Provides verbal and written material to individual with discussion of infection control including proper hand washing and proper equipment cleaning during exercise session.   Cardiac Rehab from 01/27/2019 in [facility] Cardiac and Pulmonary Rehab  Date  01/27/19  Educator  [facility]  Instruction Review Code  1- Verbalizes Understanding      Falls Prevention: - Provides verbal and written material to individual with discussion of falls prevention and safety.   Cardiac Rehab from 01/27/2019 in [facility] Cardiac and Pulmonary Rehab  Date  01/27/19  Educator  [facility]  Instruction Review Code  1- Verbalizes Understanding      Diabetes: - Individual verbal and written instruction to review signs/symptoms of diabetes, desired ranges of glucose level fasting, after meals and with exercise. Acknowledge that pre and post exercise glucose checks will be done for 3 sessions at entry of program.   Cardiac Rehab from 01/27/2019 in [facility] Cardiac and Pulmonary Rehab  Date  01/23/19  Educator  [facility]  Instruction Review Code  1- Verbalizes Understanding      Know Your Numbers and Risk Factors: -Group verbal and written instruction about important numbers in your health.  Discussion of what are risk factors and how they play a role in the disease process.  Review of Cholesterol, Blood  Pressure, Diabetes, and BMI and the role they play in your overall health.   Sleep Hygiene: -Provides group verbal and written instruction about how sleep can affect your health.  Define sleep hygiene, discuss sleep cycles and impact of sleep habits. Review good sleep hygiene tips.    Other: -Provides group and verbal instruction on various topics (see comments)   Knowledge Questionnaire Score: Knowledge Questionnaire Score - 01/28/19 1026      Knowledge Questionnaire Score   Pre Score  21/26   test reviewed with pt today  Education Focus:  Angina, Heart Failure, PAD, Nutrition, Exercise      Core Components/Risk Factors/Patient Goals at Admission: Personal Goals and Risk Factors at Admission - 01/27/19 1618  Core Components/Risk Factors/Patient Goals on Admission    Weight Management  Yes;Obesity;Weight Loss    Intervention  Weight Management: Develop a combined nutrition and exercise program designed to reach desired caloric intake, while maintaining appropriate intake of nutrient and fiber, sodium and fats, and appropriate energy expenditure required for the weight goal.;Weight Management: Provide education and appropriate resources to help participant work on and attain dietary goals.;Weight Management/Obesity: Establish reasonable short term and long term weight goals.;Obesity: Provide education and appropriate resources to help participant work on and attain dietary goals.    Admit Weight  198 lb 6.4 oz (90 kg)    Goal Weight: Short Term  193 lb (87.5 kg)    Goal Weight: Long Term  188 lb (85.3 kg)    Expected Outcomes  Short Term: Continue to assess and modify interventions until short term weight is achieved;Long Term: Adherence to nutrition and physical activity/exercise program aimed toward attainment of established weight goal;Weight Loss: Understanding of general recommendations for a balanced deficit meal plan, which promotes 1-2 lb weight loss per week and includes a  negative energy balance of [phone] kcal/d;Understanding recommendations for meals to include 15-35% energy as protein, 25-35% energy from fat, 35-60% energy from carbohydrates, less than '200mg'$  of dietary cholesterol, 20-35 gm of total fiber daily;Understanding of distribution of calorie intake throughout the day with the consumption of 4-5 meals/snacks    Diabetes  Yes    Intervention  Provide education about signs/symptoms and action to take for hypo/hyperglycemia.;Provide education about proper nutrition, including hydration, and aerobic/resistive exercise prescription along with prescribed medications to achieve blood glucose in normal ranges: Fasting glucose 65-99 mg/dL    Expected Outcomes  Short Term: Participant verbalizes understanding of the signs/symptoms and immediate care of hyper/hypoglycemia, proper foot care and importance of medication, aerobic/resistive exercise and nutrition plan for blood glucose control.;Long Term: Attainment of HbA1C < 7%.    Hypertension  Yes    Intervention  Provide education on lifestyle modifcations including regular physical activity/exercise, weight management, moderate sodium restriction and increased consumption of fresh fruit, vegetables, and low fat dairy, alcohol moderation, and smoking cessation.;Monitor prescription use compliance.    Expected Outcomes  Short Term: Continued assessment and intervention until BP is < 140/55m HG in hypertensive participants. < 130/878mHG in hypertensive participants with diabetes, heart failure or chronic kidney disease.;Long Term: Maintenance of blood pressure at goal levels.    Lipids  Yes    Intervention  Provide education and support for participant on nutrition & aerobic/resistive exercise along with prescribed medications to achieve LDL '70mg'$ , HDL >'40mg'$ .    Expected Outcomes  Short Term: Participant states understanding of desired cholesterol values and is compliant with medications prescribed. Participant is  following exercise prescription and nutrition guidelines.;Long Term: Cholesterol controlled with medications as prescribed, with individualized exercise RX and with personalized nutrition plan. Value goals: LDL < '70mg'$ , HDL > 40 mg.       Core Components/Risk Factors/Patient Goals Review:  Goals and Risk Factor Review    Row Name 02/14/19 1033 03/07/19 1032           Core Components/Risk Factors/Patient Goals Review   Personal Goals Review  Hypertension;Lipids;Improve shortness of breath with ADL's  Hypertension;Lipids;Weight Management/Obesity;Diabetes      Review  LiAngelicia Lessners taking meds as prescribed.  She recently started taking albuterol.  She reports not being as sore from exercise as in the first week.  Patient wants to lose some weight. She wants to lose 20 pounds  in a healthy manner. She has tried [employer] and used her stationary bike back in 2012 and lost 40 pounds. She want to continue to be active and lose weight again.Her blood pressure has been stable and she checks her blood pressure at home everyday. Darria states that she checks her sugar every morning.      Expected Outcomes  Short - continue to take meds as directed Long - manage risk factors  Short: maintain exercise routine to help with weight loss. Long: lose 20 pounds.         Core Components/Risk Factors/Patient Goals at Discharge (Final Review):  Goals and Risk Factor Review - 03/07/19 1032      Core Components/Risk Factors/Patient Goals Review   Personal Goals Review  Hypertension;Lipids;Weight Management/Obesity;Diabetes    Review  Patient wants to lose some weight. She wants to lose 20 pounds in a healthy manner. She has tried [employer] and used her stationary bike back in 2012 and lost 40 pounds. She want to continue to be active and lose weight again.Her blood pressure has been stable and she checks her blood pressure at home everyday. Fonnie states that she checks her sugar every morning.    Expected  Outcomes  Short: maintain exercise routine to help with weight loss. Long: lose 20 pounds.       ITP Comments: ITP Comments    Row Name 01/23/19 1139 01/27/19 1547 01/29/19 0635 02/07/19 1018 02/20/19 1152   ITP Comments  Virtual Orientation with RN completed. Diagnosis can be found in [facility] 05/25/18. EP/RD orientation scheduled 8/10 at 2  Completed 6MWT, gym orientation, and RD evaluation. Initial ITP created and sent for review to Dr. Emily Filbert, Medical Director.  30 Day Review Completed today. Continue with ITP unless changed by Medical Director review.  New start to program  Weight up 2 pounds- some SOB noted by [name].  Advised call MD/ED if gets worse  Livier has complaint of nausea and had some clear emesis today. After talking with her-this has been a recurrent problem. She was advised to call her PMD and review her persistent symptoms.   [place] Name 02/26/19 0653 03/11/19 1114 03/26/19 1332       ITP Comments  30 Day review. Continue with ITP unless directed changes per Medical Director review.  Yalanda has new anti nausea med  Calah stated that she had some pulling pain in her chest on Sunday.  None today. She does see her physician this week to review the symptoms. Explained most likely post surgical healing.  No weights today.  30 day review completed. ITP sent to Dr. Emily Filbert, Medical Director of Cardiac and Pulmonary Rehab. Continue with ITP unless changes are made by physician.  Department closed starting 10/2 until further notice by infection prevention and Health at Work teams for [place].        Comments: 30 day review

## 2019-04-10 ENCOUNTER — Encounter: Payer: Medicare Other | Admitting: *Deleted

## 2019-04-10 ENCOUNTER — Other Ambulatory Visit: Payer: Self-pay

## 2019-04-10 DIAGNOSIS — Z951 Presence of aortocoronary bypass graft: Secondary | ICD-10-CM | POA: Diagnosis not present

## 2019-04-10 NOTE — Progress Notes (Signed)
Daily Session Note  Patient Details  Name: Brandi [name] MRN: [id number] Date of Birth: 10/09/1935 Referring Provider:     Cardiac Rehab from 01/27/2019 in [facility] Cardiac and Pulmonary Rehab  Referring Provider  Bartholome Bill MD      Encounter Date: 04/10/2019  Check In: Session Check In - 04/10/19 1129      Check-In   Supervising physician immediately available to respond to emergencies  See telemetry face sheet for immediately available ER MD    Location  [facility]-Cardiac & Pulmonary Rehab    Staff Present  Heath Lark, RN, BSN, CCRP;Jeanna Durrell BS, Exercise Physiologist;Joseph Hood RCP,RRT,BSRT    Virtual Visit  No    Medication changes reported      No    Fall or balance concerns reported     No    Warm-up and Cool-down  Performed on first and last piece of equipment    Resistance Training Performed  Yes    VAD Patient?  No    PAD/SET Patient?  No      Pain Assessment   Currently in Pain?  No/denies          Social History   Tobacco Use  Smoking Status Never Smoker  Smokeless Tobacco Never Used    Goals Met:  Independence with exercise equipment Exercise tolerated well No report of cardiac concerns or symptoms  Goals Unmet:  Not Applicable  Comments: Pt able to follow exercise prescription today without complaint.  Will continue to monitor for progression.    Dr. Emily Filbert is Medical Director for [place] and [facility].

## 2019-04-11 ENCOUNTER — Encounter: Payer: Medicare Other | Admitting: *Deleted

## 2019-04-11 DIAGNOSIS — Z951 Presence of aortocoronary bypass graft: Secondary | ICD-10-CM

## 2019-04-11 NOTE — Progress Notes (Signed)
Daily Session Note  Patient Details  Name: Brandi [name] MRN: [id number] Date of Birth: [birth year] Referring Provider:     Cardiac Rehab from 01/27/2019 in [facility] Cardiac and Pulmonary Rehab  Referring Provider  Bartholome Bill MD      Encounter Date: 04/11/2019  Check In: Session Check In - 04/11/19 1021      Check-In   Supervising physician immediately available to respond to emergencies  See telemetry face sheet for immediately available ER MD    Location  [facility]-Cardiac & Pulmonary Rehab    Staff Present  Heath Lark, RN, BSN, CCRP;Jessica Bricelyn, MA, RCEP, CCRP, [place], [place], ACSM CEP, Exercise Physiologist    Virtual Visit  No    Medication changes reported      No    Fall or balance concerns reported     No    Warm-up and Cool-down  Performed on first and last piece of equipment    Resistance Training Performed  Yes    VAD Patient?  No    PAD/SET Patient?  No      Pain Assessment   Currently in Pain?  No/denies          Social History   Tobacco Use  Smoking Status Never Smoker  Smokeless Tobacco Never Used    Goals Met:  Independence with exercise equipment Exercise tolerated well Personal goals reviewed No report of cardiac concerns or symptoms  Goals Unmet:  Not Applicable  Comments: Pt able to follow exercise prescription today without complaint.  Will continue to monitor for progression.    Dr. Emily Filbert is Medical Director for [place] and [facility].

## 2019-04-15 ENCOUNTER — Other Ambulatory Visit: Payer: Self-pay

## 2019-04-15 ENCOUNTER — Encounter: Payer: Medicare Other | Admitting: *Deleted

## 2019-04-15 DIAGNOSIS — Z951 Presence of aortocoronary bypass graft: Secondary | ICD-10-CM

## 2019-04-15 NOTE — Progress Notes (Signed)
Daily Session Note  Patient Details  Name: Brandi [name] MRN: [id number] Date of Birth: 27-Sep-1935 Referring Provider:     Cardiac Rehab from 01/27/2019 in [facility] Cardiac and Pulmonary Rehab  Referring Provider  Bartholome Bill MD      Encounter Date: 04/15/2019  Check In: Session Check In - 04/15/19 1034      Check-In   Supervising physician immediately available to respond to emergencies  See telemetry face sheet for immediately available ER MD    Location  [facility]-Cardiac & Pulmonary Rehab    Staff Present  Darel Hong, RN BSN;Joseph Foy [place], [place], ACSM CEP, Exercise Physiologist    Virtual Visit  No    Medication changes reported      No    Fall or balance concerns reported     No    Warm-up and Cool-down  Performed on first and last piece of equipment    Resistance Training Performed  Yes    VAD Patient?  No    PAD/SET Patient?  No      Pain Assessment   Currently in Pain?  No/denies          Social History   Tobacco Use  Smoking Status Never Smoker  Smokeless Tobacco Never Used    Goals Met:  Independence with exercise equipment Exercise tolerated well No report of cardiac concerns or symptoms Strength training completed today  Goals Unmet:  Not Applicable  Comments: Pt able to follow exercise prescription today without complaint.  Will continue to monitor for progression.    Dr. Emily Filbert is Medical Director for [place] and [facility].

## 2019-04-17 ENCOUNTER — Encounter: Payer: Medicare Other | Admitting: *Deleted

## 2019-04-17 ENCOUNTER — Other Ambulatory Visit: Payer: Self-pay

## 2019-04-17 DIAGNOSIS — Z951 Presence of aortocoronary bypass graft: Secondary | ICD-10-CM

## 2019-04-17 NOTE — Progress Notes (Signed)
Daily Session Note  Patient Details  Name: Brandi [name] MRN: [id number] Date of Birth: 02/19/1936 Referring Provider:     Cardiac Rehab from 01/27/2019 in [facility] Cardiac and Pulmonary Rehab  Referring Provider  Fath, Kenneth MD      Encounter Date: 04/17/2019  Check In: Session Check In - 04/17/19 1031      Check-In   Supervising physician immediately available to respond to emergencies  See telemetry face sheet for immediately available ER MD    Location  [facility]-Cardiac & Pulmonary Rehab    Staff Present   , RN BSN;Amanda Sommer, BA, ACSM CEP, Exercise Physiologist;Jeanna Durrell BS, Exercise Physiologist    Virtual Visit  No    Medication changes reported      No    Fall or balance concerns reported     No    Warm-up and Cool-down  Performed on first and last piece of equipment    Resistance Training Performed  Yes    VAD Patient?  No    PAD/SET Patient?  No      Pain Assessment   Currently in Pain?  No/denies          Social History   Tobacco Use  Smoking Status Never Smoker  Smokeless Tobacco Never Used    Goals Met:  Independence with exercise equipment Exercise tolerated well No report of cardiac concerns or symptoms Strength training completed today  Goals Unmet:  Not Applicable  Comments: Pt able to follow exercise prescription today without complaint.  Will continue to monitor for progression.    Dr. Mark Miller is Medical Director for [facility] and [facility]. 

## 2019-04-18 ENCOUNTER — Encounter: Payer: Medicare Other | Admitting: *Deleted

## 2019-04-18 DIAGNOSIS — Z951 Presence of aortocoronary bypass graft: Secondary | ICD-10-CM | POA: Diagnosis not present

## 2019-04-18 NOTE — Progress Notes (Signed)
Daily Session Note  Patient Details  Name: Brandi [name] MRN: [id number] Date of Birth: Sep 12, 1935 Referring Provider:     Cardiac Rehab from 01/27/2019 in [facility] Cardiac and Pulmonary Rehab  Referring Provider  Bartholome Bill MD      Encounter Date: 04/18/2019  Check In: Session Check In - 04/18/19 1017      Check-In   Supervising physician immediately available to respond to emergencies  See telemetry face sheet for immediately available ER MD    Location  [facility]-Cardiac & Pulmonary Rehab    Staff Present  Renita Papa, RN Vickki Hearing, BA, ACSM CEP, Exercise Physiologist;Jessica [place], MA, RCEP, CCRP, CCET;Joseph [place]    Virtual Visit  No    Medication changes reported      No    Fall or balance concerns reported     No    Warm-up and Cool-down  Performed on first and last piece of equipment    Resistance Training Performed  Yes    VAD Patient?  No    PAD/SET Patient?  No      Pain Assessment   Currently in Pain?  No/denies          Social History   Tobacco Use  Smoking Status Never Smoker  Smokeless Tobacco Never Used    Goals Met:  Independence with exercise equipment Exercise tolerated well No report of cardiac concerns or symptoms Strength training completed today  Goals Unmet:  Not Applicable  Comments: Pt able to follow exercise prescription today without complaint.  Will continue to monitor for progression.    Dr. Emily Filbert is Medical Director for [place] and [facility].

## 2019-04-22 ENCOUNTER — Other Ambulatory Visit: Payer: Self-pay

## 2019-04-22 ENCOUNTER — Encounter: Payer: Medicare Other | Attending: Cardiology | Admitting: *Deleted

## 2019-04-22 DIAGNOSIS — K219 Gastro-esophageal reflux disease without esophagitis: Secondary | ICD-10-CM | POA: Insufficient documentation

## 2019-04-22 DIAGNOSIS — Z791 Long term (current) use of non-steroidal anti-inflammatories (NSAID): Secondary | ICD-10-CM | POA: Insufficient documentation

## 2019-04-22 DIAGNOSIS — Z794 Long term (current) use of insulin: Secondary | ICD-10-CM | POA: Diagnosis not present

## 2019-04-22 DIAGNOSIS — Z7902 Long term (current) use of antithrombotics/antiplatelets: Secondary | ICD-10-CM | POA: Diagnosis not present

## 2019-04-22 DIAGNOSIS — G4730 Sleep apnea, unspecified: Secondary | ICD-10-CM | POA: Insufficient documentation

## 2019-04-22 DIAGNOSIS — E119 Type 2 diabetes mellitus without complications: Secondary | ICD-10-CM | POA: Insufficient documentation

## 2019-04-22 DIAGNOSIS — F419 Anxiety disorder, unspecified: Secondary | ICD-10-CM | POA: Diagnosis not present

## 2019-04-22 DIAGNOSIS — Z951 Presence of aortocoronary bypass graft: Secondary | ICD-10-CM | POA: Diagnosis not present

## 2019-04-22 DIAGNOSIS — Z7982 Long term (current) use of aspirin: Secondary | ICD-10-CM | POA: Insufficient documentation

## 2019-04-22 DIAGNOSIS — M1990 Unspecified osteoarthritis, unspecified site: Secondary | ICD-10-CM | POA: Diagnosis not present

## 2019-04-22 DIAGNOSIS — E785 Hyperlipidemia, unspecified: Secondary | ICD-10-CM | POA: Diagnosis not present

## 2019-04-22 DIAGNOSIS — Z79899 Other long term (current) drug therapy: Secondary | ICD-10-CM | POA: Insufficient documentation

## 2019-04-22 DIAGNOSIS — F329 Major depressive disorder, single episode, unspecified: Secondary | ICD-10-CM | POA: Insufficient documentation

## 2019-04-22 DIAGNOSIS — E669 Obesity, unspecified: Secondary | ICD-10-CM | POA: Diagnosis not present

## 2019-04-22 NOTE — Progress Notes (Signed)
Daily Session Note  Patient Details  Name: Brandi [name] MRN: [id number] Date of Birth: 1936/02/09 Referring Provider:     Cardiac Rehab from 01/27/2019 in [facility]  Referring Provider  Bartholome Bill MD      Encounter Date: 04/22/2019  Check In: Session Check In - 04/22/19 1024      Check-In   Supervising physician immediately available to respond to emergencies  See telemetry face sheet for immediately available ER MD    Location  [facility]    Staff Present  Heath Lark, RN, BSN, CCRP;Amanda Sommer, BA, ACSM CEP, Exercise Physiologist    Virtual Visit  No    Medication changes reported      No    Fall or balance concerns reported     No    Warm-up and Cool-down  Performed on first and last piece of equipment    Resistance Training Performed  Yes    VAD Patient?  No    PAD/SET Patient?  No      Pain Assessment   Currently in Pain?  No/denies          Social History   Tobacco Use  Smoking Status Never Smoker  Smokeless Tobacco Never Used    Goals Met:  Independence with exercise equipment Exercise tolerated well No report of cardiac concerns or symptoms  Goals Unmet:  BP  Comments: Arrived with high BP readings. Sat for 5-8 min and BP did come down.  Had "Thanksgiving dinner" with family yesterday. Has taken all AM meds. BP remained improved.   Dr. Emily Filbert is Medical Director for [facility].

## 2019-04-23 ENCOUNTER — Encounter: Payer: Self-pay | Admitting: *Deleted

## 2019-04-23 DIAGNOSIS — Z951 Presence of aortocoronary bypass graft: Secondary | ICD-10-CM

## 2019-04-23 NOTE — Progress Notes (Signed)
Cardiac Individual Treatment Plan  Patient Details  Name: Brandi [name] MRN: [id number] Date of Birth: September 19, 1935 Referring Provider:     Cardiac Rehab from 01/27/2019 in [facility] Cardiac and Pulmonary Rehab  Referring Provider  Bartholome Bill MD      Initial Encounter Date:    Cardiac Rehab from 01/27/2019 in [facility] Cardiac and Pulmonary Rehab  Date  01/27/19      Visit Diagnosis: S/P CABG x 3  Patient's Home Medications on Admission:  Current Outpatient Medications:  .  acetaminophen (TYLENOL) 500 MG tablet, Take by mouth., Disp: , Rfl:  .  albuterol (VENTOLIN HFA) 108 (90 Base) MCG/ACT inhaler, , Disp: , Rfl:  .  aspirin EC 81 MG tablet, Take 81 mg by mouth daily., Disp: , Rfl:  .  atorvastatin (LIPITOR) 40 MG tablet, Take by mouth., Disp: , Rfl:  .  budesonide-formoterol (SYMBICORT) 80-4.5 MCG/ACT inhaler, Inhale 2 puffs into the lungs 2 (two) times daily., Disp: , Rfl:  .  Calcium Carb-Cholecalciferol (CALCIUM + D3 PO), Take 1 tablet by mouth daily., Disp: , Rfl:  .  carboxymethylcellulose (REFRESH TEARS) 0.5 % SOLN, Apply 1-2 drops to eye at bedtime. , Disp: , Rfl:  .  carvedilol (COREG) 6.25 MG tablet, Take 6.25 mg by mouth 2 (two) times daily with a meal. , Disp: , Rfl:  .  clopidogrel (PLAVIX) 75 MG tablet, Take 75 mg by mouth daily., Disp: , Rfl:  .  conjugated estrogens (PREMARIN) vaginal cream, Place 1 Applicatorful vaginally daily. Apply 0.71m (pea-sized amount)  just inside the vaginal introitus with a finger-tip every night for two weeks and then Monday, Wednesday and Friday nights. (Patient not taking: Reported on 01/23/2019), Disp: 30 g, Rfl: 12 .  Cyanocobalamin (VITAMIN B-12 PO), Take 1 tablet by mouth daily., Disp: , Rfl:  .  gabapentin (NEURONTIN) 100 MG capsule, , Disp: , Rfl:  .  glipiZIDE (GLUCOTROL) 5 MG tablet, Take 5 mg by mouth 2 (two) times daily. , Disp: , Rfl:  .  hydrochlorothiazide (HYDRODIURIL) 12.5 MG tablet, , Disp: , Rfl: 1 .  Insulin Glargine (LANTUS  SOLOSTAR) 100 UNIT/ML Solostar Pen, Inject 12 Units into the skin at bedtime. , Disp: , Rfl:  .  JANUMET XR 50-1000 MG TB24, Take 1 tablet by mouth 2 (two) times daily., Disp: , Rfl:  .  losartan (COZAAR) 100 MG tablet, Take 100 mg by mouth daily., Disp: , Rfl:  .  meloxicam (MOBIC) 15 MG tablet, Take 15 mg by mouth daily., Disp: , Rfl:  .  mirabegron ER (MYRBETRIQ) 25 MG TB24 tablet, Take 1 tablet (25 mg total) by mouth daily., Disp: 90 tablet, Rfl: 3 .  Multiple Vitamins-Minerals (PRESERVISION/LUTEIN) CAPS, Take 1 capsule by mouth 2 (two) times daily. , Disp: , Rfl:  .  pantoprazole (PROTONIX) 40 MG tablet, Take 40 mg by mouth daily., Disp: , Rfl:  .  prasugrel (EFFIENT) 10 MG TABS tablet, Take 10 mg by mouth daily., Disp: , Rfl:  .  pregabalin (LYRICA) 75 MG capsule, Take 75 mg by mouth 2 (two) times daily., Disp: , Rfl:  .  senna-docusate (SENOKOT-S) 8.6-50 MG tablet, Take 1 tablet by mouth at bedtime as needed for mild constipation., Disp: 10 tablet, Rfl: 0 .  simvastatin (ZOCOR) 20 MG tablet, Take 20 mg by mouth daily at 6 PM. , Disp: , Rfl:  .  SitaGLIPtin-MetFORMIN HCl (JANUMET XR) 50-1000 MG TB24, Take 1 tablet by mouth 2 (two) times daily., Disp: , Rfl:  .  sodium chloride (ALTAMIST SPRAY) 0.65 % nasal spray, Place into the nose., Disp: , Rfl:  .  traZODone (DESYREL) 50 MG tablet, , Disp: , Rfl:  .  venlafaxine (EFFEXOR) 37.5 MG tablet, , Disp: , Rfl:  .  Verapamil HCl CR 300 MG CP24, Take 1 capsule by mouth daily., Disp: , Rfl:   Past Medical History: Past Medical History:  Diagnosis Date  . Anemia   . Anxiety   . Arthritis, degenerative   . Chronic cystitis   . Depression   . Diabetes mellitus without complication ([place])   . GERD (gastroesophageal reflux disease)   . Headache   . Heart disease   . HLD (hyperlipidemia)   . HTN (hypertension)   . Hypertension   . Incontinence in female   . Murmur   . Obesity   . Sleep apnea   . Urge incontinence of urine     Tobacco  Use: Social History   Tobacco Use  Smoking Status Never Smoker  Smokeless Tobacco Never Used    Labs: Recent Review Flowsheet Data    Labs for ITP Cardiac and Pulmonary Rehab Latest Ref Rng & Units 06/30/2011 04/16/2016   Cholestrol 0 - 200 mg/dL 141 -   LDLCALC 0 - 100 mg/dL 64 -   HDL 40 - 60 mg/dL 63(H) -   Trlycerides 0 - 200 mg/dL 71 -   Hemoglobin A1c 4.8 - 5.6 % 7.7(H) 7.3(H)       Exercise Target Goals: Exercise Program Goal: Individual exercise prescription set using results from initial 6 min walk test and THRR while considering  patient's activity barriers and safety.   Exercise Prescription Goal: Initial exercise prescription builds to 30-45 minutes a day of aerobic activity, 2-3 days per week.  Home exercise guidelines will be given to patient during program as part of exercise prescription that the participant will acknowledge.  Activity Barriers & Risk Stratification: Activity Barriers & Cardiac Risk Stratification - 01/27/19 1613      Activity Barriers & Cardiac Risk Stratification   Activity Barriers  Back Problems;Arthritis;Joint Problems;Deconditioning;Balance Concerns;Muscular Weakness;Shortness of Breath;Assistive Device;History of Falls    Cardiac Risk Stratification  High       6 Minute Walk: 6 Minute Walk    Row Name 01/27/19 1612         6 Minute Walk   Phase  Initial     Distance  620 feet     Walk Time  5.5 minutes     # of Rest Breaks  0     MPH  1.28     METS  1.15     RPE  17     Perceived Dyspnea   2     VO2 Peak  4.02     Symptoms  Yes (comment)     Comments  winded, fatigue     Resting HR  81 bpm     Resting BP  142/74     Resting Oxygen Saturation   97 %     Exercise Oxygen Saturation  during 6 min walk  96 %     Max Ex. HR  134 bpm     Max Ex. BP  156/76     2 Minute Post BP  142/70        Oxygen Initial Assessment:   Oxygen Re-Evaluation:   Oxygen Discharge (Final Oxygen Re-Evaluation):   Initial Exercise  Prescription: Initial Exercise Prescription - 01/27/19 1600      Date of  Initial Exercise RX and Referring Provider   Date  01/27/19    Referring Provider  Bartholome Bill MD      Recumbant Bike   Level  1    RPM  50    Watts  5    Minutes  15    METs  1.1      NuStep   Level  1    SPM  80    Minutes  15    METs  1      Arm Ergometer   Level  1    RPM  25    Minutes  15    METs  1      Recumbant Elliptical   Level  1    RPM  50    Minutes  15    METs  1      REL-XR   Level  1    Speed  50    Minutes  15    METs  1.2      T5 Nustep   Level  1    SPM  80    Minutes  15    METs  1      Biostep-RELP   Level  1    SPM  50    Minutes  15    METs  1      Prescription Details   Frequency (times per week)  3    Duration  Progress to 30 minutes of continuous aerobic without signs/symptoms of physical distress      Intensity   THRR 40-80% of Max Heartrate  105-128    Ratings of Perceived Exertion  11-13    Perceived Dyspnea  0-4      Progression   Progression  Continue to progress workloads to maintain intensity without signs/symptoms of physical distress.      Resistance Training   Training Prescription  Yes    Weight  2 lbs    Reps  10-15       Perform Capillary Blood Glucose checks as needed.  Exercise Prescription Changes: Exercise Prescription Changes    Row Name 01/27/19 1600 02/06/19 1500 02/18/19 1200 03/04/19 1400 03/19/19 1300     Response to Exercise   Blood Pressure (Admit)  142/74  122/72  124/80  122/70  154/80   Blood Pressure (Exercise)  156/76  148/74  136/80  138/70  160/84   Blood Pressure (Exit)  142/70  128/88  130/80  138/74  150/80   Heart Rate (Admit)  81 bpm  76 bpm  83 bpm  80 bpm  88 bpm   Heart Rate (Exercise)  134 bpm  111 bpm  112 bpm  95 bpm  114 bpm   Heart Rate (Exit)  83 bpm  84 bpm  76 bpm  48 bpm  70 bpm   Oxygen Saturation (Admit)  97 %  -  -  -  -   Oxygen Saturation (Exercise)  96 %  -  -  -  -   Rating of  Perceived Exertion (Exercise)  '17  15  13  13  13   '$ Perceived Dyspnea (Exercise)  2  -  -  -  -   Symptoms  winded, fatigue  fatigue  fatigue  nausea  -   Comments  walk test results  -  -  -  -   Duration  -  Continue with 30 min of aerobic exercise without signs/symptoms  of physical distress.  Continue with 30 min of aerobic exercise without signs/symptoms of physical distress.  Continue with 30 min of aerobic exercise without signs/symptoms of physical distress.  Continue with 30 min of aerobic exercise without signs/symptoms of physical distress.   Intensity  -  THRR unchanged  THRR unchanged  THRR unchanged  THRR unchanged     Progression   Progression  -  Continue to progress workloads to maintain intensity without signs/symptoms of physical distress.  Continue to progress workloads to maintain intensity without signs/symptoms of physical distress.  Continue to progress workloads to maintain intensity without signs/symptoms of physical distress.  Continue to progress workloads to maintain intensity without signs/symptoms of physical distress.   Average METs  -  1.57  1.27  1.6  1.95     Resistance Training   Training Prescription  -  Yes  Yes  Yes  Yes   Weight  -  3 lbs  3 lbs  3 lbs  3 lb   Reps  -  10-15  10-15  10-15  10-15     Interval Training   Interval Training  -  No  No  No  No     NuStep   Level  -  -  '1  2  2   '$ Minutes  -  -  '15  15  25   '$ METs  -  -  1.8  1.6  1.9     Arm Ergometer   Level  -  1  1  -  -   Minutes  -  15  15  -  -   METs  -  1.2  1  -  -     Recumbant Elliptical   Level  -  -  -  -  - hurt arms - stayed on T4     REL-XR   Level  -  1  -  1  -   Minutes  -  15  -  15  -   METs  -  1  -  -  -     T5 Nustep   Level  -  1  -  -  -   Minutes  -  15  -  -  -   METs  -  1.7  -  -  -     Biostep-RELP   Level  -  1  1  -  -   Minutes  -  15  15  -  -   METs  -  2  1  -  -     Home Exercise Plan   Plans to continue exercise at  -  -  Home  (comment) walking  Home (comment) walking  Home (comment) walking   Frequency  -  -  Add 1 additional day to program exercise sessions.  Add 1 additional day to program exercise sessions.  Add 1 additional day to program exercise sessions.   Initial Home Exercises Provided  -  -  02/14/19  02/14/19  02/14/19   Row Name 04/01/19 1400 04/16/19 1400           Response to Exercise   Blood Pressure (Admit)  126/90  136/76      Blood Pressure (Exercise)  158/88  177/88      Blood Pressure (Exit)  168/76  134/78      Heart Rate (Admit)  80 bpm  108  bpm      Heart Rate (Exercise)  114 bpm  122 bpm      Heart Rate (Exit)  85 bpm  92 bpm      Rating of Perceived Exertion (Exercise)  14  13      Symptoms  none  none      Duration  Continue with 30 min of aerobic exercise without signs/symptoms of physical distress.  Continue with 30 min of aerobic exercise without signs/symptoms of physical distress.      Intensity  THRR unchanged  THRR unchanged        Progression   Progression  Continue to progress workloads to maintain intensity without signs/symptoms of physical distress.  Continue to progress workloads to maintain intensity without signs/symptoms of physical distress.      Average METs  2.41  1.62        Resistance Training   Training Prescription  Yes  Yes      Weight  3 lbs  3 lbs      Reps  10-15  10-15        Interval Training   Interval Training  No  No        Recumbant Bike   Level  1  -      Watts  13  -      Minutes  15  -      METs  2.52  -        NuStep   Level  2  -      Minutes  15  -      METs  2.1  -        Arm Ergometer   Level  1  1      Minutes  15  15      METs  1.3  1.2        Recumbant Elliptical   Level  1  -      Minutes  15  -      METs  3.2  -        REL-XR   Level  -  1      Minutes  -  15      METs  -  1.5        T5 Nustep   Level  -  1      Minutes  -  15      METs  -  1.8        Biostep-RELP   Level  1  1      Minutes  15  15       METs  2  2        Home Exercise Plan   Plans to continue exercise at  Home (comment) walking  Home (comment) walking      Frequency  Add 1 additional day to program exercise sessions.  Add 1 additional day to program exercise sessions.      Initial Home Exercises Provided  02/14/19  02/14/19         Exercise Comments: Exercise Comments    Row Name 01/28/19 1026 02/07/19 1018 02/20/19 1153 03/11/19 1114     Exercise Comments  First full day of exercise!  Patient was oriented to gym and equipment including functions, settings, policies, and procedures.  Patient's individual exercise prescription and treatment plan were reviewed.  All starting workloads were established based on the results of the 6 minute walk test  done at initial orientation visit.  The plan for exercise progression was also introduced and progression will be customized based on patient's performance and goals.  Weight up 2 pounds- some SOB noted by [name].  Advised call MD/ED if gets worse  Kahlee has complaint of nausea and had some clear emesis today. After talking with her-this has been a recurrent problem. She was advised to call her PMD and review her persistent symptoms.  Harlean stated that she had some pulling pain in her chest on Sunday.  None today. She does see her physician this week to review the symptoms. Explained most likely post surgical healing.  No weights today.       Exercise Goals and Review: Exercise Goals    Row Name 01/27/19 1617             Exercise Goals   Increase Physical Activity  Yes       Intervention  Provide advice, education, support and counseling about physical activity/exercise needs.;Develop an individualized exercise prescription for aerobic and resistive training based on initial evaluation findings, risk stratification, comorbidities and participant's personal goals.       Expected Outcomes  Short Term: Attend rehab on a regular basis to increase amount of physical activity.;Long  Term: Add in home exercise to make exercise part of routine and to increase amount of physical activity.;Long Term: Exercising regularly at least 3-5 days a week.       Increase Strength and Stamina  Yes       Intervention  Provide advice, education, support and counseling about physical activity/exercise needs.;Develop an individualized exercise prescription for aerobic and resistive training based on initial evaluation findings, risk stratification, comorbidities and participant's personal goals.       Expected Outcomes  Short Term: Increase workloads from initial exercise prescription for resistance, speed, and METs.;Short Term: Perform resistance training exercises routinely during rehab and add in resistance training at home;Long Term: Improve cardiorespiratory fitness, muscular endurance and strength as measured by increased METs and functional capacity (6MWT)       Able to understand and use rate of perceived exertion (RPE) scale  Yes       Intervention  Provide education and explanation on how to use RPE scale       Expected Outcomes  Long Term:  Able to use RPE to guide intensity level when exercising independently;Short Term: Able to use RPE daily in rehab to express subjective intensity level       Able to understand and use Dyspnea scale  Yes       Intervention  Provide education and explanation on how to use Dyspnea scale       Expected Outcomes  Short Term: Able to use Dyspnea scale daily in rehab to express subjective sense of shortness of breath during exertion;Long Term: Able to use Dyspnea scale to guide intensity level when exercising independently       Knowledge and understanding of Target Heart Rate Range (THRR)  Yes       Intervention  Provide education and explanation of THRR including how the numbers were predicted and where they are located for reference       Expected Outcomes  Short Term: Able to state/look up THRR;Short Term: Able to use daily as guideline for intensity in  rehab;Long Term: Able to use THRR to govern intensity when exercising independently       Able to check pulse independently  Yes       Intervention  Provide education and demonstration on how to check pulse in carotid and radial arteries.;Review the importance of being able to check your own pulse for safety during independent exercise       Expected Outcomes  Short Term: Able to explain why pulse checking is important during independent exercise;Long Term: Able to check pulse independently and accurately       Understanding of Exercise Prescription  Yes       Intervention  Provide education, explanation, and written materials on patient's individual exercise prescription       Expected Outcomes  Short Term: Able to explain program exercise prescription;Long Term: Able to explain home exercise prescription to exercise independently          Exercise Goals Re-Evaluation : Exercise Goals Re-Evaluation    Row Name 01/28/19 1028 02/06/19 1541 02/14/19 1032 02/18/19 1253 03/04/19 1341     Exercise Goal Re-Evaluation   Exercise Goals Review  Increase Physical Activity;Increase Strength and Stamina;Able to understand and use rate of perceived exertion (RPE) scale;Able to understand and use Dyspnea scale;Knowledge and understanding of Target Heart Rate Range (THRR);Understanding of Exercise Prescription  Increase Physical Activity;Increase Strength and Stamina;Understanding of Exercise Prescription  Increase Physical Activity;Increase Strength and Stamina;Able to understand and use rate of perceived exertion (RPE) scale;Knowledge and understanding of Target Heart Rate Range (THRR);Able to check pulse independently  Increase Physical Activity;Increase Strength and Stamina;Able to understand and use rate of perceived exertion (RPE) scale;Knowledge and understanding of Target Heart Rate Range (THRR);Able to check pulse independently  -   Comments  Reviewed RPE scale, THR and program prescription with pt today.   Pt voiced understanding and was given a copy of goals to take home.  Charmon Thorson is off to a good start in rehab.  She getting in all 30 of her minutes much to her surprise.  We will continue to monitor her progress.  Lerae Langham has a stationary bike at home.  She plans to start by adding one day a week in addition to HT sessions.  Eugene Zeiders is doing well in rehab.  She is now on level 2 on the NuStep.  We will continue to monitor her progress.  Evalena Fujii continues to do well in rehab.  She had an epsiode last week of nausea in class and was not able to go continuously like she had been doing.  She is doing well on the NuStep.  We will continue to monitor her progress.   Expected Outcomes  Short: Use RPE daily to regulate intensity. Long: Follow program prescription in THR.  Short: Continue to attend class regulalry.  Long: Continue to follow program prescription.  Short - add one exrecise session in addition to HT  Short: Continue to increase speed on the BioStep.  Long: Continue to add in exercise at home on off days.  Short: Continue to increase workloads.  Long: Continue to exercise at home.   [place] Name 03/19/19 1340 04/01/19 1414 04/11/19 1025 04/16/19 1455       Exercise Goal Re-Evaluation   Exercise Goals Review  Increase Physical Activity;Increase Strength and Stamina;Able to understand and use rate of perceived exertion (RPE) scale;Knowledge and understanding of Target Heart Rate Range (THRR);Able to check pulse independently;Understanding of Exercise Prescription  Increase Physical Activity;Increase Strength and Stamina;Understanding of Exercise Prescription  Increase Physical Activity;Increase Strength and Stamina  Increase Physical Activity;Increase Strength and Stamina;Understanding of Exercise Prescription    Comments  Shiasia attends consistently.  She has had less nausea  since starting a new med from her Dr.  Zenovia Jarred has been doing well in rehab.  She is up to 13 watts on the bike.  We will continue to  monitor her progress.  When she is not at rehab she is out shopping and doing chores at home. She states that her neighborhood does not have sidewalks and the ground is uneven so she does not walk.  Cartha Rotert is doing well in rehab.  She is holding steady with her METs and RPEs usually between 12-13.  We will continue to monitor her progress.    Expected Outcomes  Short - continue to attend consistently Long - maintain regular exercise routine  Short: Increase BioStep.  Long: Continue to improve stamina.  Short:come up with a home workout plan. Long: maintain exercise at home independently.  Short: Continue to attend regularly  Long: Find home exercise routine.       Discharge Exercise Prescription (Final Exercise Prescription Changes): Exercise Prescription Changes - 04/16/19 1400      Response to Exercise   Blood Pressure (Admit)  136/76    Blood Pressure (Exercise)  177/88    Blood Pressure (Exit)  134/78    Heart Rate (Admit)  108 bpm    Heart Rate (Exercise)  122 bpm    Heart Rate (Exit)  92 bpm    Rating of Perceived Exertion (Exercise)  13    Symptoms  none    Duration  Continue with 30 min of aerobic exercise without signs/symptoms of physical distress.    Intensity  THRR unchanged      Progression   Progression  Continue to progress workloads to maintain intensity without signs/symptoms of physical distress.    Average METs  1.62      Resistance Training   Training Prescription  Yes    Weight  3 lbs    Reps  10-15      Interval Training   Interval Training  No      Arm Ergometer   Level  1    Minutes  15    METs  1.2      REL-XR   Level  1    Minutes  15    METs  1.5      T5 Nustep   Level  1    Minutes  15    METs  1.8      Biostep-RELP   Level  1    Minutes  15    METs  2      Home Exercise Plan   Plans to continue exercise at  Home (comment)   walking   Frequency  Add 1 additional day to program exercise sessions.    Initial Home Exercises Provided   02/14/19       Nutrition:  Target Goals: Understanding of nutrition guidelines, daily intake of sodium '1500mg'$ , cholesterol '200mg'$ , calories 30% from fat and 7% or less from saturated fats, daily to have 5 or more servings of fruits and vegetables.  Biometrics: Pre Biometrics - 01/27/19 1617      Pre Biometrics   Height  '5\' 1"'$  (1.549 m)    Weight  198 lb 6.4 oz (90 kg)    BMI (Calculated)  37.51    Single Leg Stand  0.37 seconds        Nutrition Therapy Plan and Nutrition Goals: Nutrition Therapy & Goals - 01/27/19 1526      Nutrition Therapy   Diet  Low  Na, HH, DM diet    Drug/Food Interactions  Statins/Certain Fruits   lipitor   Protein (specify units)  75g    Fiber  25 grams    Whole Grain Foods  3 servings    Saturated Fats  12 max. grams    Fruits and Vegetables  5 servings/day    Sodium  1.5 grams      Personal Nutrition Goals   Nutrition Goal  ST: increase vegetable intake (+1 serving 3x/week) LT: walk again and get rid of cane.    Comments  Lantus, janumet, glipizidesenokot, coregCa + D3 A1c 10/2018 was 6.7 (WNL). Pt reports for B she eats grits, eggos, special K or cinnamon cheerios with whole milk. L tomato sandwich with full fat mayo and white bread. Dinner is chicken with the skin and/or bacon, baked pork chops and instant potatoes or white pasta. Pt will use margarine, high fat protein, whole fat dairy, fried foods at least 2x/week, minimal F and V. Pt suggested she needed to eat more vegetables, but would still like to review the content. Discussed HH DM eating.      Intervention Plan   Intervention  Prescribe, educate and counsel regarding individualized specific dietary modifications aiming towards targeted core components such as weight, hypertension, lipid management, diabetes, heart failure and other comorbidities.;Nutrition handout(s) given to patient.    Expected Outcomes  Short Term Goal: Understand basic principles of dietary content, such as calories,  fat, sodium, cholesterol and nutrients.;Long Term Goal: Adherence to prescribed nutrition plan.;Short Term Goal: A plan has been developed with personal nutrition goals set during dietitian appointment.       Nutrition Assessments: Nutrition Assessments - 01/27/19 1542      MEDFICTS Scores   Pre Score  100       Nutrition Goals Re-Evaluation: Nutrition Goals Re-Evaluation    [place] Name 02/11/19 1034 03/11/19 1044 04/22/19 1138         Goals   Current Weight  198 lb 6.4 oz (90 kg)  -  -     Nutrition Goal  ST: increase vegetable intake (+1 serving everyday/week) LT: walk again and get rid of cane.  ST: Try to eat broccoli 2x week LT: walk again and get rid of cane.  ST: Try to eat broccoli 2x week LT: walk again and get rid of cane.     Comment  Pt reports now eating lima beans and corn 3x/week, now moving to everyday for next goal. Discussed the importance of variety in the diet and in vegetables. Pt asked about saturated vs unsaturated fat, discussed what they are and what some healthy swaps would be.  Pt reports now eating lima beans and corn lunch and dinner. Discussed the importance of variety in the diet and in vegetables. pt reports not liking greens but likes broccoli but hasnt had it in a while.  Continue with current changes     Expected Outcome  ST: increase vegetable intake (+1 serving everyday/week) LT: walk again and get rid of cane.  ST: Try to eat broccoli 2x week LT: walk again and get rid of cane.  ST: Try to eat broccoli 2x week LT: walk again and get rid of cane.        Nutrition Goals Discharge (Final Nutrition Goals Re-Evaluation): Nutrition Goals Re-Evaluation - 04/22/19 1138      Goals   Nutrition Goal  ST: Try to eat broccoli 2x week LT: walk again and get rid of cane.  Comment  Continue with current changes    Expected Outcome  ST: Try to eat broccoli 2x week LT: walk again and get rid of cane.       Psychosocial: Target Goals: Acknowledge presence or  absence of significant depression and/or stress, maximize coping skills, provide positive support system. Participant is able to verbalize types and ability to use techniques and skills needed for reducing stress and depression.   Initial Review & Psychosocial Screening: Initial Psych Review & Screening - 01/23/19 1151      Initial Review   Current issues with  Current Sleep Concerns;Current Stress Concerns    Comments  Shruthi is trying to regulate her sleep still from her admission in December. She is taking trazodone, but is still drowsy most of the morning so she is trying to slowly come off of it. She reported her only source of stress right now is Covid. She lives with 2 of her children, so they have been making sure she has everything she needs. She has not been out much since March.      Family Dynamics   Good Support System?  Yes      Barriers   Psychosocial barriers to participate in program  There are no identifiable barriers or psychosocial needs.;The patient should benefit from training in stress management and relaxation.      Screening Interventions   Interventions  Encouraged to exercise;Provide feedback about the scores to participant    Expected Outcomes  Short Term goal: Utilizing psychosocial counselor, staff and physician to assist with identification of specific Stressors or current issues interfering with healing process. Setting desired goal for each stressor or current issue identified.;Long Term Goal: Stressors or current issues are controlled or eliminated.;Short Term goal: Identification and review with participant of any Quality of Life or Depression concerns found by scoring the questionnaire.;Long Term goal: The participant improves quality of Life and PHQ9 Scores as seen by post scores and/or verbalization of changes       Quality of Life Scores:  Quality of Life - 01/27/19 1618      Quality of Life   Select  Quality of Life      Quality of Life Scores    Health/Function Pre  17.58 %    Socioeconomic Pre  20.86 %    Psych/Spiritual Pre  20 %    Family Pre  23.63 %    GLOBAL Pre  19.72 %      Scores of 19 and below usually indicate a poorer quality of life in these areas.  A difference of  2-3 points is a clinically meaningful difference.  A difference of 2-3 points in the total score of the Quality of Life Index has been associated with significant improvement in overall quality of life, self-image, physical symptoms, and general health in studies assessing change in quality of life.  PHQ-9: Recent Review Flowsheet Data    Depression screen [facility] 2/9 02/27/2019 01/27/2019   Decreased Interest 0 1   Down, Depressed, Hopeless 0 1   PHQ - 2 Score 0 2   Altered sleeping 0 2   Tired, decreased energy 1 1   Change in appetite 1 2   Feeling bad or failure about yourself  0 1   Trouble concentrating 0 0   Moving slowly or fidgety/restless 1 0   Suicidal thoughts 0 0   PHQ-9 Score 3 8   Difficult doing work/chores Not difficult at all Somewhat difficult  Interpretation of Total Score  Total Score Depression Severity:  1-4 = Minimal depression, 5-9 = Mild depression, 10-14 = Moderate depression, 15-19 = Moderately severe depression, 20-27 = Severe depression   Psychosocial Evaluation and Intervention:   Psychosocial Re-Evaluation: Psychosocial Re-Evaluation    [place] Name 03/07/19 1029 04/11/19 1028           Psychosocial Re-Evaluation   Current issues with  Current Sleep Concerns;History of Depression;Current Stress Concerns  Current Sleep Concerns;History of Depression;Current Stress Concerns      Comments  She had nausea last night whick did not help her sleeping routine. She is able to sleep with her sleeping medication but is groggy in the morning. She is going to the doctors next week to talk about cutting back on her medications to help her to not be so groggy.  Tamie is taking medication to help her sleep. She does get up during  the night at times to go to the bathroom. Her two sons live with her and help her get to places and run errends when she needs. Her nausea has been better this past week. She has been dizzy and it concerns her because she does not want to fall. Her disszy spells are stressing her out because she does not want to fall.      Expected Outcomes  Short: talk with her doctor about sleeping medications. Long: maintain a good sleeping routine.  Short: Attend [facility] stress management education to decrease stress. Long: Maintain exercise Post HeartTrack to keep stress at a minimum.      Interventions  Encouraged to attend Cardiac Rehabilitation for the exercise  Encouraged to attend Cardiac Rehabilitation for the exercise      Continue Psychosocial Services   Follow up required by staff  Follow up required by staff         Psychosocial Discharge (Final Psychosocial Re-Evaluation): Psychosocial Re-Evaluation - 04/11/19 1028      Psychosocial Re-Evaluation   Current issues with  Current Sleep Concerns;History of Depression;Current Stress Concerns    Comments  Shallyn is taking medication to help her sleep. She does get up during the night at times to go to the bathroom. Her two sons live with her and help her get to places and run errends when she needs. Her nausea has been better this past week. She has been dizzy and it concerns her because she does not want to fall. Her disszy spells are stressing her out because she does not want to fall.    Expected Outcomes  Short: Attend HeartTrack stress management education to decrease stress. Long: Maintain exercise Post HeartTrack to keep stress at a minimum.    Interventions  Encouraged to attend Cardiac Rehabilitation for the exercise    Continue Psychosocial Services   Follow up required by staff       Vocational Rehabilitation: Provide vocational rehab assistance to qualifying candidates.   Vocational Rehab Evaluation & Intervention: Vocational Rehab -  01/23/19 1143      Initial Vocational Rehab Evaluation & Intervention   Assessment shows need for Vocational Rehabilitation  No       Education: Education Goals: Education classes will be provided on a variety of topics geared toward better understanding of heart health and risk factor modification. Participant will state understanding/return demonstration of topics presented as noted by education test scores.  Learning Barriers/Preferences: Learning Barriers/Preferences - 01/23/19 1143      Learning Barriers/Preferences   Learning Barriers  None    Learning  Preferences  Individual Instruction       Education Topics:  AED/CPR: - Group verbal and written instruction with the use of models to demonstrate the basic use of the AED with the basic ABC's of resuscitation.   General Nutrition Guidelines/Fats and Fiber: -Group instruction provided by verbal, written material, models and posters to present the general guidelines for heart healthy nutrition. Gives an explanation and review of dietary fats and fiber.   Controlling Sodium/Reading Food Labels: -Group verbal and written material supporting the discussion of sodium use in heart healthy nutrition. Review and explanation with models, verbal and written materials for utilization of the food label.   Exercise Physiology & General Exercise Guidelines: - Group verbal and written instruction with models to review the exercise physiology of the cardiovascular system and associated critical values. Provides general exercise guidelines with specific guidelines to those with heart or lung disease.    Aerobic Exercise & Resistance Training: - Gives group verbal and written instruction on the various components of exercise. Focuses on aerobic and resistive training programs and the benefits of this training and how to safely progress through these programs..   Flexibility, Balance, Mind/Body Relaxation: Provides group verbal/written  instruction on the benefits of flexibility and balance training, including mind/body exercise modes such as yoga, pilates and tai chi.  Demonstration and skill practice provided.   Stress and Anxiety: - Provides group verbal and written instruction about the health risks of elevated stress and causes of high stress.  Discuss the correlation between heart/lung disease and anxiety and treatment options. Review healthy ways to manage with stress and anxiety.   Depression: - Provides group verbal and written instruction on the correlation between heart/lung disease and depressed mood, treatment options, and the stigmas associated with seeking treatment.   Anatomy & Physiology of the Heart: - Group verbal and written instruction and models provide basic cardiac anatomy and physiology, with the coronary electrical and arterial systems. Review of Valvular disease and Heart Failure   Cardiac Procedures: - Group verbal and written instruction to review commonly prescribed medications for heart disease. Reviews the medication, class of the drug, and side effects. Includes the steps to properly store meds and maintain the prescription regimen. (beta blockers and nitrates)   Cardiac Medications I: - Group verbal and written instruction to review commonly prescribed medications for heart disease. Reviews the medication, class of the drug, and side effects. Includes the steps to properly store meds and maintain the prescription regimen.   Cardiac Medications II: -Group verbal and written instruction to review commonly prescribed medications for heart disease. Reviews the medication, class of the drug, and side effects. (all other drug classes)    Go Sex-Intimacy & Heart Disease, Get SMART - Goal Setting: - Group verbal and written instruction through game format to discuss heart disease and the return to sexual intimacy. Provides group verbal and written material to discuss and apply goal setting  through the application of the S.M.A.R.T. Method.   Other Matters of the Heart: - Provides group verbal, written materials and models to describe Stable Angina and Peripheral Artery. Includes description of the disease process and treatment options available to the cardiac patient.   Exercise & Equipment Safety: - Individual verbal instruction and demonstration of equipment use and safety with use of the equipment.   Cardiac Rehab from 01/27/2019 in [facility] Cardiac and Pulmonary Rehab  Date  01/27/19  Educator  [facility]  Instruction Review Code  1- Verbalizes Understanding  Infection Prevention: - Provides verbal and written material to individual with discussion of infection control including proper hand washing and proper equipment cleaning during exercise session.   Cardiac Rehab from 01/27/2019 in [facility] Cardiac and Pulmonary Rehab  Date  01/27/19  Educator  [facility]  Instruction Review Code  1- Verbalizes Understanding      Falls Prevention: - Provides verbal and written material to individual with discussion of falls prevention and safety.   Cardiac Rehab from 01/27/2019 in [facility] Cardiac and Pulmonary Rehab  Date  01/27/19  Educator  [facility]  Instruction Review Code  1- Verbalizes Understanding      Diabetes: - Individual verbal and written instruction to review signs/symptoms of diabetes, desired ranges of glucose level fasting, after meals and with exercise. Acknowledge that pre and post exercise glucose checks will be done for 3 sessions at entry of program.   Cardiac Rehab from 01/27/2019 in [facility] Cardiac and Pulmonary Rehab  Date  01/23/19  Educator  [facility]  Instruction Review Code  1- Verbalizes Understanding      Know Your Numbers and Risk Factors: -Group verbal and written instruction about important numbers in your health.  Discussion of what are risk factors and how they play a role in the disease process.  Review of Cholesterol, Blood Pressure, Diabetes, and BMI and the role they  play in your overall health.   Sleep Hygiene: -Provides group verbal and written instruction about how sleep can affect your health.  Define sleep hygiene, discuss sleep cycles and impact of sleep habits. Review good sleep hygiene tips.    Other: -Provides group and verbal instruction on various topics (see comments)   Knowledge Questionnaire Score: Knowledge Questionnaire Score - 01/28/19 1026      Knowledge Questionnaire Score   Pre Score  21/26   test reviewed with pt today  Education Focus:  Angina, Heart Failure, PAD, Nutrition, Exercise      Core Components/Risk Factors/Patient Goals at Admission: Personal Goals and Risk Factors at Admission - 01/27/19 1618      Core Components/Risk Factors/Patient Goals on Admission    Weight Management  Yes;Obesity;Weight Loss    Intervention  Weight Management: Develop a combined nutrition and exercise program designed to reach desired caloric intake, while maintaining appropriate intake of nutrient and fiber, sodium and fats, and appropriate energy expenditure required for the weight goal.;Weight Management: Provide education and appropriate resources to help participant work on and attain dietary goals.;Weight Management/Obesity: Establish reasonable short term and long term weight goals.;Obesity: Provide education and appropriate resources to help participant work on and attain dietary goals.    Admit Weight  198 lb 6.4 oz (90 kg)    Goal Weight: Short Term  193 lb (87.5 kg)    Goal Weight: Long Term  188 lb (85.3 kg)    Expected Outcomes  Short Term: Continue to assess and modify interventions until short term weight is achieved;Long Term: Adherence to nutrition and physical activity/exercise program aimed toward attainment of established weight goal;Weight Loss: Understanding of general recommendations for a balanced deficit meal plan, which promotes 1-2 lb weight loss per week and includes a negative energy balance of [phone]  kcal/d;Understanding recommendations for meals to include 15-35% energy as protein, 25-35% energy from fat, 35-60% energy from carbohydrates, less than '200mg'$  of dietary cholesterol, 20-35 gm of total fiber daily;Understanding of distribution of calorie intake throughout the day with the consumption of 4-5 meals/snacks    Diabetes  Yes    Intervention  Provide  education about signs/symptoms and action to take for hypo/hyperglycemia.;Provide education about proper nutrition, including hydration, and aerobic/resistive exercise prescription along with prescribed medications to achieve blood glucose in normal ranges: Fasting glucose 65-99 mg/dL    Expected Outcomes  Short Term: Participant verbalizes understanding of the signs/symptoms and immediate care of hyper/hypoglycemia, proper foot care and importance of medication, aerobic/resistive exercise and nutrition plan for blood glucose control.;Long Term: Attainment of HbA1C < 7%.    Hypertension  Yes    Intervention  Provide education on lifestyle modifcations including regular physical activity/exercise, weight management, moderate sodium restriction and increased consumption of fresh fruit, vegetables, and low fat dairy, alcohol moderation, and smoking cessation.;Monitor prescription use compliance.    Expected Outcomes  Short Term: Continued assessment and intervention until BP is < 140/45m HG in hypertensive participants. < 130/857mHG in hypertensive participants with diabetes, heart failure or chronic kidney disease.;Long Term: Maintenance of blood pressure at goal levels.    Lipids  Yes    Intervention  Provide education and support for participant on nutrition & aerobic/resistive exercise along with prescribed medications to achieve LDL '70mg'$ , HDL >'40mg'$ .    Expected Outcomes  Short Term: Participant states understanding of desired cholesterol values and is compliant with medications prescribed. Participant is following exercise prescription and  nutrition guidelines.;Long Term: Cholesterol controlled with medications as prescribed, with individualized exercise RX and with personalized nutrition plan. Value goals: LDL < '70mg'$ , HDL > 40 mg.       Core Components/Risk Factors/Patient Goals Review:  Goals and Risk Factor Review    Row Name 02/14/19 1033 03/07/19 1032 04/11/19 1034         Core Components/Risk Factors/Patient Goals Review   Personal Goals Review  Hypertension;Lipids;Improve shortness of breath with ADL's  Hypertension;Lipids;Weight Management/Obesity;Diabetes  Weight Management/Obesity;Lipids;Hypertension;Improve shortness of breath with ADL's     Review  LiKylii Enniss taking meds as prescribed.  She recently started taking albuterol.  She reports not being as sore from exercise as in the first week.  Patient wants to lose some weight. She wants to lose 20 pounds in a healthy manner. She has tried [employer] used her stationary bike back in 2012 and lost 40 pounds. She want to continue to be active and lose weight again.Her blood pressure has been stable and she checks her blood pressure at home everyday. LiParisatates that she checks her sugar every morning.  LiLamaeants to get her weight down. She is not sticking with a diet at the moment. Informed her to talk with our Dietician to get a hold on her diet and weight. Patient is focusing on her exercise and will try to lose a few pounds. Most of the time she can clean and cook without her getting short of breath.     Expected Outcomes  Short - continue to take meds as directed Long - manage risk factors  Short: maintain exercise routine to help with weight loss. Long: lose 20 pounds.  Short: Attend HeartTrack regularly to improve shortness of breath with ADL's. Long: maintain independence with ADL's        Core Components/Risk Factors/Patient Goals at Discharge (Final Review):  Goals and Risk Factor Review - 04/11/19 1034      Core Components/Risk Factors/Patient Goals  Review   Personal Goals Review  Weight Management/Obesity;Lipids;Hypertension;Improve shortness of breath with ADL's    Review  LiNealyants to get her weight down. She is not sticking with a diet at the moment. Informed her  to talk with our Dietician to get a hold on her diet and weight. Patient is focusing on her exercise and will try to lose a few pounds. Most of the time she can clean and cook without her getting short of breath.    Expected Outcomes  Short: Attend HeartTrack regularly to improve shortness of breath with ADL's. Long: maintain independence with ADL's       ITP Comments: ITP Comments    Row Name 01/23/19 1139 01/27/19 1547 01/29/19 0635 02/07/19 1018 02/20/19 1152   ITP Comments  Virtual Orientation with RN completed. Diagnosis can be found in [facility] 05/25/18. EP/RD orientation scheduled 8/10 at 2  Completed 6MWT, gym orientation, and RD evaluation. Initial ITP created and sent for review to Dr. Emily Filbert, Medical Director.  30 Day Review Completed today. Continue with ITP unless changed by Medical Director review.  New start to program  Weight up 2 pounds- some SOB noted by [name].  Advised call MD/ED if gets worse  Marijane has complaint of nausea and had some clear emesis today. After talking with her-this has been a recurrent problem. She was advised to call her PMD and review her persistent symptoms.   [place] Name 02/26/19 1216 03/11/19 1114 03/26/19 1332 04/22/19 1025 04/23/19 0706   ITP Comments  30 Day review. Continue with ITP unless directed changes per Medical Director review.  Zyrah has new anti nausea med  Alandria stated that she had some pulling pain in her chest on Sunday.  None today. She does see her physician this week to review the symptoms. Explained most likely post surgical healing.  No weights today.  30 day review completed. ITP sent to Dr. Emily Filbert, Medical Director of Cardiac and Pulmonary Rehab. Continue with ITP unless changes are made by physician.  Department  closed starting 10/2 until further notice by infection prevention and Health at Work teams for [place].  Arrived with high BP readings. Sat for 5-8 min and BP did come down.  Had "Thanksgiving dinner" with family yesterday. Has taken all AM meds.  30 day review completed. Continue with ITP sent to Dr. Emily Filbert, Medical Director of Cardiac and Pulmonary Rehab for review , changes as needed and signature.      Comments:

## 2019-04-24 ENCOUNTER — Encounter: Payer: Medicare Other | Admitting: *Deleted

## 2019-04-24 ENCOUNTER — Other Ambulatory Visit: Payer: Self-pay

## 2019-04-24 DIAGNOSIS — Z951 Presence of aortocoronary bypass graft: Secondary | ICD-10-CM

## 2019-04-24 NOTE — Progress Notes (Signed)
Daily Session Note  Patient Details  Name: Brandi [name] MRN: [id number] Date of Birth: March 25, 1936 Referring Provider:     Cardiac Rehab from 01/27/2019 in [facility] Cardiac and Pulmonary Rehab  Referring Provider  Bartholome Bill MD      Encounter Date: 04/24/2019  Check In: Session Check In - 04/24/19 1023      Check-In   Supervising physician immediately available to respond to emergencies  See telemetry face sheet for immediately available ER MD    Staff Present  Heath Lark, RN, BSN, CCRP;Jeanna Durrell BS, Exercise Physiologist;Amanda Oletta Darter, BA, ACSM CEP, Exercise Physiologist    Virtual Visit  No    Medication changes reported      No    Fall or balance concerns reported     Yes    Warm-up and Cool-down  Performed on first and last piece of equipment    Resistance Training Performed  Yes    VAD Patient?  No    PAD/SET Patient?  No      Pain Assessment   Currently in Pain?  No/denies          Social History   Tobacco Use  Smoking Status Never Smoker  Smokeless Tobacco Never Used    Goals Met:  Independence with exercise equipment Exercise tolerated well No report of cardiac concerns or symptoms  Goals Unmet:  Not Applicable  Comments: Pt able to follow exercise prescription today without complaint.  Will continue to monitor for progression.    Dr. Emily Filbert is Medical Director for [place] and [facility].

## 2019-04-25 ENCOUNTER — Encounter: Payer: Medicare Other | Admitting: *Deleted

## 2019-04-25 DIAGNOSIS — Z951 Presence of aortocoronary bypass graft: Secondary | ICD-10-CM | POA: Diagnosis not present

## 2019-04-25 NOTE — Progress Notes (Signed)
Daily Session Note  Patient Details  Name: Brandi [name] MRN: [id number] Date of Birth: 1935/10/17 Referring Provider:     Cardiac Rehab from 01/27/2019 in [facility] Cardiac and Pulmonary Rehab  Referring Provider  Bartholome Bill MD      Encounter Date: 04/25/2019  Check In: Session Check In - 04/25/19 1031      Check-In   Supervising physician immediately available to respond to emergencies  See telemetry face sheet for immediately available ER MD    Location  [facility]-Cardiac & Pulmonary Rehab    Staff Present  Renita Papa, RN BSN;Jessica Bancroft, MA, RCEP, CCRP, CCET;Amanda Sommer, [place], ACSM CEP, Exercise Physiologist    Virtual Visit  No    Medication changes reported      No    Fall or balance concerns reported     No    Warm-up and Cool-down  Performed on first and last piece of equipment    Resistance Training Performed  Yes    VAD Patient?  No    PAD/SET Patient?  No      Pain Assessment   Currently in Pain?  No/denies          Social History   Tobacco Use  Smoking Status Never Smoker  Smokeless Tobacco Never Used    Goals Met:  Independence with exercise equipment Exercise tolerated well Personal goals reviewed No report of cardiac concerns or symptoms Strength training completed today  Goals Unmet:  Not Applicable  Comments: Pt able to follow exercise prescription today without complaint.  Will continue to monitor for progression.    Dr. Emily Filbert is Medical Director for [place] and [facility].

## 2019-05-02 ENCOUNTER — Encounter: Payer: Medicare Other | Admitting: *Deleted

## 2019-05-02 ENCOUNTER — Other Ambulatory Visit: Payer: Self-pay

## 2019-05-02 DIAGNOSIS — Z951 Presence of aortocoronary bypass graft: Secondary | ICD-10-CM | POA: Diagnosis not present

## 2019-05-02 NOTE — Progress Notes (Signed)
Daily Session Note  Patient Details  Name: Brandi [name] MRN: [id number] Date of Birth: 08-14-1935 Referring Provider:     Cardiac Rehab from 01/27/2019 in [facility]  Referring Provider  Bartholome Bill MD      Encounter Date: 05/02/2019  Check In: Session Check In - 05/02/19 1009      Check-In   Supervising physician immediately available to respond to emergencies  See telemetry face sheet for immediately available ER MD    Location  [facility]    Staff Present  Renita Papa, RN BSN;Jessica Luan Pulling, MA, RCEP, CCRP, CCET;Joseph Castlewood RCP,RRT,BSRT    Virtual Visit  No    Medication changes reported      No    Fall or balance concerns reported     No    Warm-up and Cool-down  Performed on first and last piece of equipment    Resistance Training Performed  Yes    VAD Patient?  No    PAD/SET Patient?  No      Pain Assessment   Currently in Pain?  No/denies          Social History   Tobacco Use  Smoking Status Never Smoker  Smokeless Tobacco Never Used    Goals Met:  Independence with exercise equipment Exercise tolerated well No report of cardiac concerns or symptoms Strength training completed today  Goals Unmet:  Not Applicable  Comments: Pt able to follow exercise prescription today without complaint.  Will continue to monitor for progression.    Dr. Emily Filbert is Medical Director for [facility].

## 2019-05-07 ENCOUNTER — Telehealth: Payer: Self-pay | Admitting: *Deleted

## 2019-05-07 NOTE — Telephone Encounter (Signed)
Brandi [name] called to inform staff that she is diagnosed with bronchitis and is also being tested for COVID. Will call staff back with results.

## 2019-05-20 ENCOUNTER — Telehealth: Payer: Self-pay | Admitting: *Deleted

## 2019-05-20 ENCOUNTER — Encounter: Payer: Self-pay | Admitting: *Deleted

## 2019-05-20 DIAGNOSIS — Z951 Presence of aortocoronary bypass graft: Secondary | ICD-10-CM

## 2019-05-20 NOTE — Telephone Encounter (Signed)
Called to check on pt.  She is still coughing.  She hopes to return next week.

## 2019-05-21 ENCOUNTER — Encounter: Payer: Self-pay | Admitting: *Deleted

## 2019-05-21 DIAGNOSIS — Z951 Presence of aortocoronary bypass graft: Secondary | ICD-10-CM

## 2019-05-21 NOTE — Progress Notes (Signed)
Cardiac Individual Treatment Plan  Patient Details  Name: Brandi [name] MRN: [id number] Date of Birth: September 19, 1935 Referring Provider:     Cardiac Rehab from 01/27/2019 in [facility] Cardiac and Pulmonary Rehab  Referring Provider  Bartholome Bill MD      Initial Encounter Date:    Cardiac Rehab from 01/27/2019 in [facility] Cardiac and Pulmonary Rehab  Date  01/27/19      Visit Diagnosis: S/P CABG x 3  Patient's Home Medications on Admission:  Current Outpatient Medications:  .  acetaminophen (TYLENOL) 500 MG tablet, Take by mouth., Disp: , Rfl:  .  albuterol (VENTOLIN HFA) 108 (90 Base) MCG/ACT inhaler, , Disp: , Rfl:  .  aspirin EC 81 MG tablet, Take 81 mg by mouth daily., Disp: , Rfl:  .  atorvastatin (LIPITOR) 40 MG tablet, Take by mouth., Disp: , Rfl:  .  budesonide-formoterol (SYMBICORT) 80-4.5 MCG/ACT inhaler, Inhale 2 puffs into the lungs 2 (two) times daily., Disp: , Rfl:  .  Calcium Carb-Cholecalciferol (CALCIUM + D3 PO), Take 1 tablet by mouth daily., Disp: , Rfl:  .  carboxymethylcellulose (REFRESH TEARS) 0.5 % SOLN, Apply 1-2 drops to eye at bedtime. , Disp: , Rfl:  .  carvedilol (COREG) 6.25 MG tablet, Take 6.25 mg by mouth 2 (two) times daily with a meal. , Disp: , Rfl:  .  clopidogrel (PLAVIX) 75 MG tablet, Take 75 mg by mouth daily., Disp: , Rfl:  .  conjugated estrogens (PREMARIN) vaginal cream, Place 1 Applicatorful vaginally daily. Apply 0.71m (pea-sized amount)  just inside the vaginal introitus with a finger-tip every night for two weeks and then Monday, Wednesday and Friday nights. (Patient not taking: Reported on 01/23/2019), Disp: 30 g, Rfl: 12 .  Cyanocobalamin (VITAMIN B-12 PO), Take 1 tablet by mouth daily., Disp: , Rfl:  .  gabapentin (NEURONTIN) 100 MG capsule, , Disp: , Rfl:  .  glipiZIDE (GLUCOTROL) 5 MG tablet, Take 5 mg by mouth 2 (two) times daily. , Disp: , Rfl:  .  hydrochlorothiazide (HYDRODIURIL) 12.5 MG tablet, , Disp: , Rfl: 1 .  Insulin Glargine (LANTUS  SOLOSTAR) 100 UNIT/ML Solostar Pen, Inject 12 Units into the skin at bedtime. , Disp: , Rfl:  .  JANUMET XR 50-1000 MG TB24, Take 1 tablet by mouth 2 (two) times daily., Disp: , Rfl:  .  losartan (COZAAR) 100 MG tablet, Take 100 mg by mouth daily., Disp: , Rfl:  .  meloxicam (MOBIC) 15 MG tablet, Take 15 mg by mouth daily., Disp: , Rfl:  .  mirabegron ER (MYRBETRIQ) 25 MG TB24 tablet, Take 1 tablet (25 mg total) by mouth daily., Disp: 90 tablet, Rfl: 3 .  Multiple Vitamins-Minerals (PRESERVISION/LUTEIN) CAPS, Take 1 capsule by mouth 2 (two) times daily. , Disp: , Rfl:  .  pantoprazole (PROTONIX) 40 MG tablet, Take 40 mg by mouth daily., Disp: , Rfl:  .  prasugrel (EFFIENT) 10 MG TABS tablet, Take 10 mg by mouth daily., Disp: , Rfl:  .  pregabalin (LYRICA) 75 MG capsule, Take 75 mg by mouth 2 (two) times daily., Disp: , Rfl:  .  senna-docusate (SENOKOT-S) 8.6-50 MG tablet, Take 1 tablet by mouth at bedtime as needed for mild constipation., Disp: 10 tablet, Rfl: 0 .  simvastatin (ZOCOR) 20 MG tablet, Take 20 mg by mouth daily at 6 PM. , Disp: , Rfl:  .  SitaGLIPtin-MetFORMIN HCl (JANUMET XR) 50-1000 MG TB24, Take 1 tablet by mouth 2 (two) times daily., Disp: , Rfl:  .  sodium chloride (ALTAMIST SPRAY) 0.65 % nasal spray, Place into the nose., Disp: , Rfl:  .  traZODone (DESYREL) 50 MG tablet, , Disp: , Rfl:  .  venlafaxine (EFFEXOR) 37.5 MG tablet, , Disp: , Rfl:  .  Verapamil HCl CR 300 MG CP24, Take 1 capsule by mouth daily., Disp: , Rfl:   Past Medical History: Past Medical History:  Diagnosis Date  . Anemia   . Anxiety   . Arthritis, degenerative   . Chronic cystitis   . Depression   . Diabetes mellitus without complication ([place])   . GERD (gastroesophageal reflux disease)   . Headache   . Heart disease   . HLD (hyperlipidemia)   . HTN (hypertension)   . Hypertension   . Incontinence in female   . Murmur   . Obesity   . Sleep apnea   . Urge incontinence of urine     Tobacco  Use: Social History   Tobacco Use  Smoking Status Never Smoker  Smokeless Tobacco Never Used    Labs: Recent Review Flowsheet Data    Labs for ITP Cardiac and Pulmonary Rehab Latest Ref Rng & Units 06/30/2011 04/16/2016   Cholestrol 0 - 200 mg/dL 141 -   LDLCALC 0 - 100 mg/dL 64 -   HDL 40 - 60 mg/dL 63(H) -   Trlycerides 0 - 200 mg/dL 71 -   Hemoglobin A1c 4.8 - 5.6 % 7.7(H) 7.3(H)       Exercise Target Goals: Exercise Program Goal: Individual exercise prescription set using results from initial 6 min walk test and THRR while considering  patient's activity barriers and safety.   Exercise Prescription Goal: Initial exercise prescription builds to 30-45 minutes a day of aerobic activity, 2-3 days per week.  Home exercise guidelines will be given to patient during program as part of exercise prescription that the participant will acknowledge.  Activity Barriers & Risk Stratification: Activity Barriers & Cardiac Risk Stratification - 01/27/19 1613      Activity Barriers & Cardiac Risk Stratification   Activity Barriers  Back Problems;Arthritis;Joint Problems;Deconditioning;Balance Concerns;Muscular Weakness;Shortness of Breath;Assistive Device;History of Falls    Cardiac Risk Stratification  High       6 Minute Walk: 6 Minute Walk    Row Name 01/27/19 1612         6 Minute Walk   Phase  Initial     Distance  620 feet     Walk Time  5.5 minutes     # of Rest Breaks  0     MPH  1.28     METS  1.15     RPE  17     Perceived Dyspnea   2     VO2 Peak  4.02     Symptoms  Yes (comment)     Comments  winded, fatigue     Resting HR  81 bpm     Resting BP  142/74     Resting Oxygen Saturation   97 %     Exercise Oxygen Saturation  during 6 min walk  96 %     Max Ex. HR  134 bpm     Max Ex. BP  156/76     2 Minute Post BP  142/70        Oxygen Initial Assessment:   Oxygen Re-Evaluation:   Oxygen Discharge (Final Oxygen Re-Evaluation):   Initial Exercise  Prescription: Initial Exercise Prescription - 01/27/19 1600      Date of  Initial Exercise RX and Referring Provider   Date  01/27/19    Referring Provider  Bartholome Bill MD      Recumbant Bike   Level  1    RPM  50    Watts  5    Minutes  15    METs  1.1      NuStep   Level  1    SPM  80    Minutes  15    METs  1      Arm Ergometer   Level  1    RPM  25    Minutes  15    METs  1      Recumbant Elliptical   Level  1    RPM  50    Minutes  15    METs  1      REL-XR   Level  1    Speed  50    Minutes  15    METs  1.2      T5 Nustep   Level  1    SPM  80    Minutes  15    METs  1      Biostep-RELP   Level  1    SPM  50    Minutes  15    METs  1      Prescription Details   Frequency (times per week)  3    Duration  Progress to 30 minutes of continuous aerobic without signs/symptoms of physical distress      Intensity   THRR 40-80% of Max Heartrate  105-128    Ratings of Perceived Exertion  11-13    Perceived Dyspnea  0-4      Progression   Progression  Continue to progress workloads to maintain intensity without signs/symptoms of physical distress.      Resistance Training   Training Prescription  Yes    Weight  2 lbs    Reps  10-15       Perform Capillary Blood Glucose checks as needed.  Exercise Prescription Changes: Exercise Prescription Changes    Row Name 01/27/19 1600 02/06/19 1500 02/18/19 1200 03/04/19 1400 03/19/19 1300     Response to Exercise   Blood Pressure (Admit)  142/74  122/72  124/80  122/70  154/80   Blood Pressure (Exercise)  156/76  148/74  136/80  138/70  160/84   Blood Pressure (Exit)  142/70  128/88  130/80  138/74  150/80   Heart Rate (Admit)  81 bpm  76 bpm  83 bpm  80 bpm  88 bpm   Heart Rate (Exercise)  134 bpm  111 bpm  112 bpm  95 bpm  114 bpm   Heart Rate (Exit)  83 bpm  84 bpm  76 bpm  48 bpm  70 bpm   Oxygen Saturation (Admit)  97 %  -  -  -  -   Oxygen Saturation (Exercise)  96 %  -  -  -  -   Rating of  Perceived Exertion (Exercise)  '17  15  13  13  13   '$ Perceived Dyspnea (Exercise)  2  -  -  -  -   Symptoms  winded, fatigue  fatigue  fatigue  nausea  -   Comments  walk test results  -  -  -  -   Duration  -  Continue with 30 min of aerobic exercise without signs/symptoms  of physical distress.  Continue with 30 min of aerobic exercise without signs/symptoms of physical distress.  Continue with 30 min of aerobic exercise without signs/symptoms of physical distress.  Continue with 30 min of aerobic exercise without signs/symptoms of physical distress.   Intensity  -  THRR unchanged  THRR unchanged  THRR unchanged  THRR unchanged     Progression   Progression  -  Continue to progress workloads to maintain intensity without signs/symptoms of physical distress.  Continue to progress workloads to maintain intensity without signs/symptoms of physical distress.  Continue to progress workloads to maintain intensity without signs/symptoms of physical distress.  Continue to progress workloads to maintain intensity without signs/symptoms of physical distress.   Average METs  -  1.57  1.27  1.6  1.95     Resistance Training   Training Prescription  -  Yes  Yes  Yes  Yes   Weight  -  3 lbs  3 lbs  3 lbs  3 lb   Reps  -  10-15  10-15  10-15  10-15     Interval Training   Interval Training  -  No  No  No  No     NuStep   Level  -  -  '1  2  2   '$ Minutes  -  -  '15  15  25   '$ METs  -  -  1.8  1.6  1.9     Arm Ergometer   Level  -  1  1  -  -   Minutes  -  15  15  -  -   METs  -  1.2  1  -  -     Recumbant Elliptical   Level  -  -  -  -  - hurt arms - stayed on T4     REL-XR   Level  -  1  -  1  -   Minutes  -  15  -  15  -   METs  -  1  -  -  -     T5 Nustep   Level  -  1  -  -  -   Minutes  -  15  -  -  -   METs  -  1.7  -  -  -     Biostep-RELP   Level  -  1  1  -  -   Minutes  -  15  15  -  -   METs  -  2  1  -  -     Home Exercise Plan   Plans to continue exercise at  -  -  Home  (comment) walking  Home (comment) walking  Home (comment) walking   Frequency  -  -  Add 1 additional day to program exercise sessions.  Add 1 additional day to program exercise sessions.  Add 1 additional day to program exercise sessions.   Initial Home Exercises Provided  -  -  02/14/19  02/14/19  02/14/19   Row Name 04/01/19 1400 04/16/19 1400 04/30/19 1300 05/12/19 1300       Response to Exercise   Blood Pressure (Admit)  126/90  136/76  166/84  132/70    Blood Pressure (Exercise)  158/88  177/88  178/84  190/92    Blood Pressure (Exit)  168/76  134/78  158/84  144/66    Heart Rate (  Admit)  80 bpm  108 bpm  86 bpm  106 bpm    Heart Rate (Exercise)  114 bpm  122 bpm  126 bpm  113 bpm    Heart Rate (Exit)  85 bpm  92 bpm  90 bpm  101 bpm    Rating of Perceived Exertion (Exercise)  '14  13  13  13    '$ Symptoms  none  none  none  none    Duration  Continue with 30 min of aerobic exercise without signs/symptoms of physical distress.  Continue with 30 min of aerobic exercise without signs/symptoms of physical distress.  Continue with 30 min of aerobic exercise without signs/symptoms of physical distress.  Continue with 30 min of aerobic exercise without signs/symptoms of physical distress.    Intensity  THRR unchanged  THRR unchanged  THRR unchanged  THRR unchanged      Progression   Progression  Continue to progress workloads to maintain intensity without signs/symptoms of physical distress.  Continue to progress workloads to maintain intensity without signs/symptoms of physical distress.  Continue to progress workloads to maintain intensity without signs/symptoms of physical distress.  Continue to progress workloads to maintain intensity without signs/symptoms of physical distress.    Average METs  2.41  1.62  1.57  1.85      Resistance Training   Training Prescription  Yes  Yes  Yes  Yes    Weight  3 lbs  3 lbs  3 lb  3 lb    Reps  10-15  10-15  10-15  10-15      Interval Training    Interval Training  No  No  No  No      Recumbant Bike   Level  1  -  -  -    Watts  13  -  -  -    Minutes  15  -  -  -    METs  2.52  -  -  -      NuStep   Level  2  -  3  -    Minutes  15  -  15  -    METs  2.1  -  1.9  -      Arm Ergometer   Level  1  1  -  1    Minutes  15  15  -  15    METs  1.3  1.2  -  1.7      Recumbant Elliptical   Level  1  -  -  -    Minutes  15  -  -  -    METs  3.2  -  -  -      REL-XR   Level  -  1  1  -    Minutes  -  15  15  -    METs  -  1.5  1  -      T5 Nustep   Level  -  1  1  -    Minutes  -  15  -  -    METs  -  1.8  1.8  -      Biostep-RELP   Level  '1  1  1  1    '$ Minutes  '15  15  15  15    '$ METs  '2  2  2  2      '$ Home Exercise  Plan   Plans to continue exercise at  Home (comment) walking  Home (comment) walking  Home (comment) walking  Home (comment) walking    Frequency  Add 1 additional day to program exercise sessions.  Add 1 additional day to program exercise sessions.  Add 2 additional days to program exercise sessions.  Add 2 additional days to program exercise sessions.    Initial Home Exercises Provided  02/14/19  02/14/19  02/14/19  02/14/19       Exercise Comments: Exercise Comments    Row Name 01/28/19 1026 02/07/19 1018 02/20/19 1153 03/11/19 1114     Exercise Comments  First full day of exercise!  Patient was oriented to gym and equipment including functions, settings, policies, and procedures.  Patient's individual exercise prescription and treatment plan were reviewed.  All starting workloads were established based on the results of the 6 minute walk test done at initial orientation visit.  The plan for exercise progression was also introduced and progression will be customized based on patient's performance and goals.  Weight up 2 pounds- some SOB noted by [name].  Advised call MD/ED if gets worse  Cheyann has complaint of nausea and had some clear emesis today. After talking with her-this has been a recurrent problem.  She was advised to call her PMD and review her persistent symptoms.  Jamaris stated that she had some pulling pain in her chest on Sunday.  None today. She does see her physician this week to review the symptoms. Explained most likely post surgical healing.  No weights today.       Exercise Goals and Review: Exercise Goals    Row Name 01/27/19 1617             Exercise Goals   Increase Physical Activity  Yes       Intervention  Provide advice, education, support and counseling about physical activity/exercise needs.;Develop an individualized exercise prescription for aerobic and resistive training based on initial evaluation findings, risk stratification, comorbidities and participant's personal goals.       Expected Outcomes  Short Term: Attend rehab on a regular basis to increase amount of physical activity.;Long Term: Add in home exercise to make exercise part of routine and to increase amount of physical activity.;Long Term: Exercising regularly at least 3-5 days a week.       Increase Strength and Stamina  Yes       Intervention  Provide advice, education, support and counseling about physical activity/exercise needs.;Develop an individualized exercise prescription for aerobic and resistive training based on initial evaluation findings, risk stratification, comorbidities and participant's personal goals.       Expected Outcomes  Short Term: Increase workloads from initial exercise prescription for resistance, speed, and METs.;Short Term: Perform resistance training exercises routinely during rehab and add in resistance training at home;Long Term: Improve cardiorespiratory fitness, muscular endurance and strength as measured by increased METs and functional capacity (6MWT)       Able to understand and use rate of perceived exertion (RPE) scale  Yes       Intervention  Provide education and explanation on how to use RPE scale       Expected Outcomes  Long Term:  Able to use RPE to guide  intensity level when exercising independently;Short Term: Able to use RPE daily in rehab to express subjective intensity level       Able to understand and use Dyspnea scale  Yes       Intervention  Provide  education and explanation on how to use Dyspnea scale       Expected Outcomes  Short Term: Able to use Dyspnea scale daily in rehab to express subjective sense of shortness of breath during exertion;Long Term: Able to use Dyspnea scale to guide intensity level when exercising independently       Knowledge and understanding of Target Heart Rate Range (THRR)  Yes       Intervention  Provide education and explanation of THRR including how the numbers were predicted and where they are located for reference       Expected Outcomes  Short Term: Able to state/look up THRR;Short Term: Able to use daily as guideline for intensity in rehab;Long Term: Able to use THRR to govern intensity when exercising independently       Able to check pulse independently  Yes       Intervention  Provide education and demonstration on how to check pulse in carotid and radial arteries.;Review the importance of being able to check your own pulse for safety during independent exercise       Expected Outcomes  Short Term: Able to explain why pulse checking is important during independent exercise;Long Term: Able to check pulse independently and accurately       Understanding of Exercise Prescription  Yes       Intervention  Provide education, explanation, and written materials on patient's individual exercise prescription       Expected Outcomes  Short Term: Able to explain program exercise prescription;Long Term: Able to explain home exercise prescription to exercise independently          Exercise Goals Re-Evaluation : Exercise Goals Re-Evaluation    Row Name 01/28/19 1028 02/06/19 1541 02/14/19 1032 02/18/19 1253 03/04/19 1341     Exercise Goal Re-Evaluation   Exercise Goals Review  Increase Physical Activity;Increase  Strength and Stamina;Able to understand and use rate of perceived exertion (RPE) scale;Able to understand and use Dyspnea scale;Knowledge and understanding of Target Heart Rate Range (THRR);Understanding of Exercise Prescription  Increase Physical Activity;Increase Strength and Stamina;Understanding of Exercise Prescription  Increase Physical Activity;Increase Strength and Stamina;Able to understand and use rate of perceived exertion (RPE) scale;Knowledge and understanding of Target Heart Rate Range (THRR);Able to check pulse independently  Increase Physical Activity;Increase Strength and Stamina;Able to understand and use rate of perceived exertion (RPE) scale;Knowledge and understanding of Target Heart Rate Range (THRR);Able to check pulse independently  -   Comments  Reviewed RPE scale, THR and program prescription with pt today.  Pt voiced understanding and was given a copy of goals to take home.  Lunetta Marina is off to a good start in rehab.  She getting in all 30 of her minutes much to her surprise.  We will continue to monitor her progress.  Torianna Junio has a stationary bike at home.  She plans to start by adding one day a week in addition to HT sessions.  Roger Fasnacht is doing well in rehab.  She is now on level 2 on the NuStep.  We will continue to monitor her progress.  Nasreen Goedecke continues to do well in rehab.  She had an epsiode last week of nausea in class and was not able to go continuously like she had been doing.  She is doing well on the NuStep.  We will continue to monitor her progress.   Expected Outcomes  Short: Use RPE daily to regulate intensity. Long: Follow program prescription in THR.  Short: Continue to  attend class regulalry.  Long: Continue to follow program prescription.  Short - add one exrecise session in addition to HT  Short: Continue to increase speed on the BioStep.  Long: Continue to add in exercise at home on off days.  Short: Continue to increase workloads.  Long: Continue to exercise at  home.   [place] Name 03/19/19 1340 04/01/19 1414 04/11/19 1025 04/16/19 1455 04/30/19 1332     Exercise Goal Re-Evaluation   Exercise Goals Review  Increase Physical Activity;Increase Strength and Stamina;Able to understand and use rate of perceived exertion (RPE) scale;Knowledge and understanding of Target Heart Rate Range (THRR);Able to check pulse independently;Understanding of Exercise Prescription  Increase Physical Activity;Increase Strength and Stamina;Understanding of Exercise Prescription  Increase Physical Activity;Increase Strength and Stamina  Increase Physical Activity;Increase Strength and Stamina;Understanding of Exercise Prescription  Increase Physical Activity;Increase Strength and Stamina;Understanding of Exercise Prescription   Comments  Sharonna attends consistently.  She has had less nausea since starting a new med from her Dr.  Zenovia Jarred has been doing well in rehab.  She is up to 13 watts on the bike.  We will continue to monitor her progress.  When she is not at rehab she is out shopping and doing chores at home. She states that her neighborhood does not have sidewalks and the ground is uneven so she does not walk.  Maurica Omura is doing well in rehab.  She is holding steady with her METs and RPEs usually between 12-13.  We will continue to monitor her progress.  Maydell Knoebel continues to do well in rehab.  She is nearing graduation and will be doing her post 6MWT next week.  She likes the Nustep and XR.  We will continue to monitor her progress.   Expected Outcomes  Short - continue to attend consistently Long - maintain regular exercise routine  Short: Increase BioStep.  Long: Continue to improve stamina.  Short:come up with a home workout plan. Long: maintain exercise at home independently.  Short: Continue to attend regularly  Long: Find home exercise routine.  Short: Improve post 6MWT.  Long: Continue to exercise independently.   [place] Name 05/02/19 1030 05/12/19 1338           Exercise Goal  Re-Evaluation   Exercise Goals Review  Increase Physical Activity;Increase Strength and Stamina;Understanding of Exercise Prescription  -      Comments  Nirvana Blanchett is doing well.  She is planning to use her recumbent bike at home for after graduation.  This last week she was decorating for Christmas.  Reneta was out sick last week.  She has only attended once since last review.      Expected Outcomes  Short: Improve post 6MWT.  Long: Continue to exercise independently.  Short: Improve post 6MWT.  Long: Continue to exercise independently.         Discharge Exercise Prescription (Final Exercise Prescription Changes): Exercise Prescription Changes - 05/12/19 1300      Response to Exercise   Blood Pressure (Admit)  132/70    Blood Pressure (Exercise)  190/92    Blood Pressure (Exit)  144/66    Heart Rate (Admit)  106 bpm    Heart Rate (Exercise)  113 bpm    Heart Rate (Exit)  101 bpm    Rating of Perceived Exertion (Exercise)  13    Symptoms  none    Duration  Continue with 30 min of aerobic exercise without signs/symptoms of physical distress.    Intensity  THRR unchanged      Progression   Progression  Continue to progress workloads to maintain intensity without signs/symptoms of physical distress.    Average METs  1.85      Resistance Training   Training Prescription  Yes    Weight  3 lb    Reps  10-15      Interval Training   Interval Training  No      Arm Ergometer   Level  1    Minutes  15    METs  1.7      Biostep-RELP   Level  1    Minutes  15    METs  2      Home Exercise Plan   Plans to continue exercise at  Home (comment)   walking   Frequency  Add 2 additional days to program exercise sessions.    Initial Home Exercises Provided  02/14/19       Nutrition:  Target Goals: Understanding of nutrition guidelines, daily intake of sodium '1500mg'$ , cholesterol '200mg'$ , calories 30% from fat and 7% or less from saturated fats, daily to have 5 or more servings of fruits  and vegetables.  Biometrics: Pre Biometrics - 01/27/19 1617      Pre Biometrics   Height  '5\' 1"'$  (1.549 m)    Weight  198 lb 6.4 oz (90 kg)    BMI (Calculated)  37.51    Single Leg Stand  0.37 seconds        Nutrition Therapy Plan and Nutrition Goals: Nutrition Therapy & Goals - 01/27/19 1526      Nutrition Therapy   Diet  Low Na, HH, DM diet    Drug/Food Interactions  Statins/Certain Fruits   lipitor   Protein (specify units)  75g    Fiber  25 grams    Whole Grain Foods  3 servings    Saturated Fats  12 max. grams    Fruits and Vegetables  5 servings/day    Sodium  1.5 grams      Personal Nutrition Goals   Nutrition Goal  ST: increase vegetable intake (+1 serving 3x/week) LT: walk again and get rid of cane.    Comments  Lantus, janumet, glipizidesenokot, coregCa + D3 A1c 10/2018 was 6.7 (WNL). Pt reports for B she eats grits, eggos, special K or cinnamon cheerios with whole milk. L tomato sandwich with full fat mayo and white bread. Dinner is chicken with the skin and/or bacon, baked pork chops and instant potatoes or white pasta. Pt will use margarine, high fat protein, whole fat dairy, fried foods at least 2x/week, minimal F and V. Pt suggested she needed to eat more vegetables, but would still like to review the content. Discussed HH DM eating.      Intervention Plan   Intervention  Prescribe, educate and counsel regarding individualized specific dietary modifications aiming towards targeted core components such as weight, hypertension, lipid management, diabetes, heart failure and other comorbidities.;Nutrition handout(s) given to patient.    Expected Outcomes  Short Term Goal: Understand basic principles of dietary content, such as calories, fat, sodium, cholesterol and nutrients.;Long Term Goal: Adherence to prescribed nutrition plan.;Short Term Goal: A plan has been developed with personal nutrition goals set during dietitian appointment.       Nutrition  Assessments: Nutrition Assessments - 01/27/19 1542      MEDFICTS Scores   Pre Score  100       Nutrition Goals Re-Evaluation: Nutrition Goals Re-Evaluation  [place] Name 02/11/19 1034 03/11/19 1044 04/22/19 1138         Goals   Current Weight  198 lb 6.4 oz (90 kg)  -  -     Nutrition Goal  ST: increase vegetable intake (+1 serving everyday/week) LT: walk again and get rid of cane.  ST: Try to eat broccoli 2x week LT: walk again and get rid of cane.  ST: Try to eat broccoli 2x week LT: walk again and get rid of cane.     Comment  Pt reports now eating lima beans and corn 3x/week, now moving to everyday for next goal. Discussed the importance of variety in the diet and in vegetables. Pt asked about saturated vs unsaturated fat, discussed what they are and what some healthy swaps would be.  Pt reports now eating lima beans and corn lunch and dinner. Discussed the importance of variety in the diet and in vegetables. pt reports not liking greens but likes broccoli but hasnt had it in a while.  Continue with current changes     Expected Outcome  ST: increase vegetable intake (+1 serving everyday/week) LT: walk again and get rid of cane.  ST: Try to eat broccoli 2x week LT: walk again and get rid of cane.  ST: Try to eat broccoli 2x week LT: walk again and get rid of cane.        Nutrition Goals Discharge (Final Nutrition Goals Re-Evaluation): Nutrition Goals Re-Evaluation - 04/22/19 1138      Goals   Nutrition Goal  ST: Try to eat broccoli 2x week LT: walk again and get rid of cane.    Comment  Continue with current changes    Expected Outcome  ST: Try to eat broccoli 2x week LT: walk again and get rid of cane.       Psychosocial: Target Goals: Acknowledge presence or absence of significant depression and/or stress, maximize coping skills, provide positive support system. Participant is able to verbalize types and ability to use techniques and skills needed for reducing stress and  depression.   Initial Review & Psychosocial Screening: Initial Psych Review & Screening - 01/23/19 1151      Initial Review   Current issues with  Current Sleep Concerns;Current Stress Concerns    Comments  Navi is trying to regulate her sleep still from her admission in December. She is taking trazodone, but is still drowsy most of the morning so she is trying to slowly come off of it. She reported her only source of stress right now is Covid. She lives with 2 of her children, so they have been making sure she has everything she needs. She has not been out much since March.      Family Dynamics   Good Support System?  Yes      Barriers   Psychosocial barriers to participate in program  There are no identifiable barriers or psychosocial needs.;The patient should benefit from training in stress management and relaxation.      Screening Interventions   Interventions  Encouraged to exercise;Provide feedback about the scores to participant    Expected Outcomes  Short Term goal: Utilizing psychosocial counselor, staff and physician to assist with identification of specific Stressors or current issues interfering with healing process. Setting desired goal for each stressor or current issue identified.;Long Term Goal: Stressors or current issues are controlled or eliminated.;Short Term goal: Identification and review with participant of any Quality of Life or Depression concerns found by scoring the  questionnaire.;Long Term goal: The participant improves quality of Life and PHQ9 Scores as seen by post scores and/or verbalization of changes       Quality of Life Scores:  Quality of Life - 01/27/19 1618      Quality of Life   Select  Quality of Life      Quality of Life Scores   Health/Function Pre  17.58 %    Socioeconomic Pre  20.86 %    Psych/Spiritual Pre  20 %    Family Pre  23.63 %    GLOBAL Pre  19.72 %      Scores of 19 and below usually indicate a poorer quality of life in these  areas.  A difference of  2-3 points is a clinically meaningful difference.  A difference of 2-3 points in the total score of the Quality of Life Index has been associated with significant improvement in overall quality of life, self-image, physical symptoms, and general health in studies assessing change in quality of life.  PHQ-9: Recent Review Flowsheet Data    Depression screen [facility] 2/9 02/27/2019 01/27/2019   Decreased Interest 0 1   Down, Depressed, Hopeless 0 1   PHQ - 2 Score 0 2   Altered sleeping 0 2   Tired, decreased energy 1 1   Change in appetite 1 2   Feeling bad or failure about yourself  0 1   Trouble concentrating 0 0   Moving slowly or fidgety/restless 1 0   Suicidal thoughts 0 0   PHQ-9 Score 3 8   Difficult doing work/chores Not difficult at all Somewhat difficult     Interpretation of Total Score  Total Score Depression Severity:  1-4 = Minimal depression, 5-9 = Mild depression, 10-14 = Moderate depression, 15-19 = Moderately severe depression, 20-27 = Severe depression   Psychosocial Evaluation and Intervention:   Psychosocial Re-Evaluation: Psychosocial Re-Evaluation    [place] Name 03/07/19 1029 04/11/19 1028 05/02/19 1031         Psychosocial Re-Evaluation   Current issues with  Current Sleep Concerns;History of Depression;Current Stress Concerns  Current Sleep Concerns;History of Depression;Current Stress Concerns  Current Sleep Concerns;History of Depression;Current Stress Concerns     Comments  She had nausea last night whick did not help her sleeping routine. She is able to sleep with her sleeping medication but is groggy in the morning. She is going to the doctors next week to talk about cutting back on her medications to help her to not be so groggy.  Kendel is taking medication to help her sleep. She does get up during the night at times to go to the bathroom. Her two sons live with her and help her get to places and run errends when she needs. Her nausea has  been better this past week. She has been dizzy and it concerns her because she does not want to fall. Her disszy spells are stressing her out because she does not want to fall.  This week she decorated for Christmas as she missed it last year.  She was also able to go shopping at [employer]!  She is doing well with self care.  Overall she is doing well.     Expected Outcomes  Short: talk with her doctor about sleeping medications. Long: maintain a good sleeping routine.  Short: Attend [facility] stress management education to decrease stress. Long: Maintain exercise Post HeartTrack to keep stress at a minimum.  Short: Continue to practice self care and enjoy  Christmas.  Long: Continue stay positive.     Interventions  Encouraged to attend Cardiac Rehabilitation for the exercise  Encouraged to attend Cardiac Rehabilitation for the exercise  -     Continue Psychosocial Services   Follow up required by staff  Follow up required by staff  -        Psychosocial Discharge (Final Psychosocial Re-Evaluation): Psychosocial Re-Evaluation - 05/02/19 1031      Psychosocial Re-Evaluation   Current issues with  Current Sleep Concerns;History of Depression;Current Stress Concerns    Comments  This week she decorated for Christmas as she missed it last year.  She was also able to go shopping at [employer]  She is doing well with self care.  Overall she is doing well.    Expected Outcomes  Short: Continue to practice self care and enjoy Christmas.  Long: Continue stay positive.       Vocational Rehabilitation: Provide vocational rehab assistance to qualifying candidates.   Vocational Rehab Evaluation & Intervention: Vocational Rehab - 01/23/19 1143      Initial Vocational Rehab Evaluation & Intervention   Assessment shows need for Vocational Rehabilitation  No       Education: Education Goals: Education classes will be provided on a variety of topics geared toward better understanding of heart health and risk  factor modification. Participant will state understanding/return demonstration of topics presented as noted by education test scores.  Learning Barriers/Preferences: Learning Barriers/Preferences - 01/23/19 1143      Learning Barriers/Preferences   Learning Barriers  None    Learning Preferences  Individual Instruction       Education Topics:  AED/CPR: - Group verbal and written instruction with the use of models to demonstrate the basic use of the AED with the basic ABC's of resuscitation.   General Nutrition Guidelines/Fats and Fiber: -Group instruction provided by verbal, written material, models and posters to present the general guidelines for heart healthy nutrition. Gives an explanation and review of dietary fats and fiber.   Controlling Sodium/Reading Food Labels: -Group verbal and written material supporting the discussion of sodium use in heart healthy nutrition. Review and explanation with models, verbal and written materials for utilization of the food label.   Exercise Physiology & General Exercise Guidelines: - Group verbal and written instruction with models to review the exercise physiology of the cardiovascular system and associated critical values. Provides general exercise guidelines with specific guidelines to those with heart or lung disease.    Aerobic Exercise & Resistance Training: - Gives group verbal and written instruction on the various components of exercise. Focuses on aerobic and resistive training programs and the benefits of this training and how to safely progress through these programs..   Cardiac Rehab from 04/24/2019 in [facility] Cardiac and Pulmonary Rehab  Date  04/24/19  Educator  jh  Instruction Review Code  1- Verbalizes Understanding      Flexibility, Balance, Mind/Body Relaxation: Provides group verbal/written instruction on the benefits of flexibility and balance training, including mind/body exercise modes such as yoga, pilates and tai  chi.  Demonstration and skill practice provided.   Stress and Anxiety: - Provides group verbal and written instruction about the health risks of elevated stress and causes of high stress.  Discuss the correlation between heart/lung disease and anxiety and treatment options. Review healthy ways to manage with stress and anxiety.   Depression: - Provides group verbal and written instruction on the correlation between heart/lung disease and depressed mood, treatment options, and the  stigmas associated with seeking treatment.   Anatomy & Physiology of the Heart: - Group verbal and written instruction and models provide basic cardiac anatomy and physiology, with the coronary electrical and arterial systems. Review of Valvular disease and Heart Failure   Cardiac Procedures: - Group verbal and written instruction to review commonly prescribed medications for heart disease. Reviews the medication, class of the drug, and side effects. Includes the steps to properly store meds and maintain the prescription regimen. (beta blockers and nitrates)   Cardiac Rehab from 04/24/2019 in [facility] Cardiac and Pulmonary Rehab  Date  04/24/19  Educator  mc  Instruction Review Code  1- Verbalizes Understanding      Cardiac Medications I: - Group verbal and written instruction to review commonly prescribed medications for heart disease. Reviews the medication, class of the drug, and side effects. Includes the steps to properly store meds and maintain the prescription regimen.   Cardiac Medications II: -Group verbal and written instruction to review commonly prescribed medications for heart disease. Reviews the medication, class of the drug, and side effects. (all other drug classes)    Go Sex-Intimacy & Heart Disease, Get SMART - Goal Setting: - Group verbal and written instruction through game format to discuss heart disease and the return to sexual intimacy. Provides group verbal and written material to  discuss and apply goal setting through the application of the S.M.A.R.T. Method.   Cardiac Rehab from 04/24/2019 in [facility] Cardiac and Pulmonary Rehab  Date  04/24/19  Educator  mc  Instruction Review Code  1- Verbalizes Understanding      Other Matters of the Heart: - Provides group verbal, written materials and models to describe Stable Angina and Peripheral Artery. Includes description of the disease process and treatment options available to the cardiac patient.   Exercise & Equipment Safety: - Individual verbal instruction and demonstration of equipment use and safety with use of the equipment.   Cardiac Rehab from 01/27/2019 in [facility] Cardiac and Pulmonary Rehab  Date  01/27/19  Educator  [facility]  Instruction Review Code  1- Verbalizes Understanding      Infection Prevention: - Provides verbal and written material to individual with discussion of infection control including proper hand washing and proper equipment cleaning during exercise session.   Cardiac Rehab from 01/27/2019 in [facility] Cardiac and Pulmonary Rehab  Date  01/27/19  Educator  [facility]  Instruction Review Code  1- Verbalizes Understanding      Falls Prevention: - Provides verbal and written material to individual with discussion of falls prevention and safety.   Cardiac Rehab from 01/27/2019 in [facility] Cardiac and Pulmonary Rehab  Date  01/27/19  Educator  [facility]  Instruction Review Code  1- Verbalizes Understanding      Diabetes: - Individual verbal and written instruction to review signs/symptoms of diabetes, desired ranges of glucose level fasting, after meals and with exercise. Acknowledge that pre and post exercise glucose checks will be done for 3 sessions at entry of program.   Cardiac Rehab from 01/27/2019 in [facility] Cardiac and Pulmonary Rehab  Date  01/23/19  Educator  [facility]  Instruction Review Code  1- Verbalizes Understanding      Know Your Numbers and Risk Factors: -Group verbal and written instruction about important  numbers in your health.  Discussion of what are risk factors and how they play a role in the disease process.  Review of Cholesterol, Blood Pressure, Diabetes, and BMI and the role they play in your overall health.  Sleep Hygiene: -Provides group verbal and written instruction about how sleep can affect your health.  Define sleep hygiene, discuss sleep cycles and impact of sleep habits. Review good sleep hygiene tips.    Other: -Provides group and verbal instruction on various topics (see comments)   Knowledge Questionnaire Score: Knowledge Questionnaire Score - 01/28/19 1026      Knowledge Questionnaire Score   Pre Score  21/26   test reviewed with pt today  Education Focus:  Angina, Heart Failure, PAD, Nutrition, Exercise      Core Components/Risk Factors/Patient Goals at Admission: Personal Goals and Risk Factors at Admission - 01/27/19 1618      Core Components/Risk Factors/Patient Goals on Admission    Weight Management  Yes;Obesity;Weight Loss    Intervention  Weight Management: Develop a combined nutrition and exercise program designed to reach desired caloric intake, while maintaining appropriate intake of nutrient and fiber, sodium and fats, and appropriate energy expenditure required for the weight goal.;Weight Management: Provide education and appropriate resources to help participant work on and attain dietary goals.;Weight Management/Obesity: Establish reasonable short term and long term weight goals.;Obesity: Provide education and appropriate resources to help participant work on and attain dietary goals.    Admit Weight  198 lb 6.4 oz (90 kg)    Goal Weight: Short Term  193 lb (87.5 kg)    Goal Weight: Long Term  188 lb (85.3 kg)    Expected Outcomes  Short Term: Continue to assess and modify interventions until short term weight is achieved;Long Term: Adherence to nutrition and physical activity/exercise program aimed toward attainment of established weight goal;Weight  Loss: Understanding of general recommendations for a balanced deficit meal plan, which promotes 1-2 lb weight loss per week and includes a negative energy balance of [id number] kcal/d;Understanding recommendations for meals to include 15-35% energy as protein, 25-35% energy from fat, 35-60% energy from carbohydrates, less than '200mg'$  of dietary cholesterol, 20-35 gm of total fiber daily;Understanding of distribution of calorie intake throughout the day with the consumption of 4-5 meals/snacks    Diabetes  Yes    Intervention  Provide education about signs/symptoms and action to take for hypo/hyperglycemia.;Provide education about proper nutrition, including hydration, and aerobic/resistive exercise prescription along with prescribed medications to achieve blood glucose in normal ranges: Fasting glucose 65-99 mg/dL    Expected Outcomes  Short Term: Participant verbalizes understanding of the signs/symptoms and immediate care of hyper/hypoglycemia, proper foot care and importance of medication, aerobic/resistive exercise and nutrition plan for blood glucose control.;Long Term: Attainment of HbA1C < 7%.    Hypertension  Yes    Intervention  Provide education on lifestyle modifcations including regular physical activity/exercise, weight management, moderate sodium restriction and increased consumption of fresh fruit, vegetables, and low fat dairy, alcohol moderation, and smoking cessation.;Monitor prescription use compliance.    Expected Outcomes  Short Term: Continued assessment and intervention until BP is < 140/14m HG in hypertensive participants. < 130/840mHG in hypertensive participants with diabetes, heart failure or chronic kidney disease.;Long Term: Maintenance of blood pressure at goal levels.    Lipids  Yes    Intervention  Provide education and support for participant on nutrition & aerobic/resistive exercise along with prescribed medications to achieve LDL '70mg'$ , HDL >'40mg'$ .    Expected Outcomes   Short Term: Participant states understanding of desired cholesterol values and is compliant with medications prescribed. Participant is following exercise prescription and nutrition guidelines.;Long Term: Cholesterol controlled with medications as prescribed, with individualized exercise RX and with personalized  nutrition plan. Value goals: LDL < '70mg'$ , HDL > 40 mg.       Core Components/Risk Factors/Patient Goals Review:  Goals and Risk Factor Review    [place] Name 02/14/19 1033 03/07/19 1032 04/11/19 1034 05/02/19 1034       Core Components/Risk Factors/Patient Goals Review   Personal Goals Review  Hypertension;Lipids;Improve shortness of breath with ADL's  Hypertension;Lipids;Weight Management/Obesity;Diabetes  Weight Management/Obesity;Lipids;Hypertension;Improve shortness of breath with ADL's  Weight Management/Obesity;Lipids;Hypertension;Improve shortness of breath with ADL's    Review  Kimesha Claxton is taking meds as prescribed.  She recently started taking albuterol.  She reports not being as sore from exercise as in the first week.  Patient wants to lose some weight. She wants to lose 20 pounds in a healthy manner. She has tried [employer] and used her stationary bike back in 2012 and lost 40 pounds. She want to continue to be active and lose weight again.Her blood pressure has been stable and she checks her blood pressure at home everyday. Catrice states that she checks her sugar every morning.  Gracelee wants to get her weight down. She is not sticking with a diet at the moment. Informed her to talk with our Dietician to get a hold on her diet and weight. Patient is focusing on her exercise and will try to lose a few pounds. Most of the time she can clean and cook without her getting short of breath.  Addisen's weight has been fairly steady.  She would still like to lose more.  Her pressures are still high but she does check them at home.  Her breathing continues to improve.    Expected Outcomes  Short -  continue to take meds as directed Long - manage risk factors  Short: maintain exercise routine to help with weight loss. Long: lose 20 pounds.  Short: Attend [facility]Track regularly to improve shortness of breath with ADL's. Long: maintain independence with ADL's  Short: Attend HeartTrack regularly to improve shortness of breath with ADL's. Long: maintain independence with ADL's       Core Components/Risk Factors/Patient Goals at Discharge (Final Review):  Goals and Risk Factor Review - 05/02/19 1034      Core Components/Risk Factors/Patient Goals Review   Personal Goals Review  Weight Management/Obesity;Lipids;Hypertension;Improve shortness of breath with ADL's    Review  Joniya's weight has been fairly steady.  She would still like to lose more.  Her pressures are still high but she does check them at home.  Her breathing continues to improve.    Expected Outcomes  Short: Attend HeartTrack regularly to improve shortness of breath with ADL's. Long: maintain independence with ADL's       ITP Comments: ITP Comments    [place] Name 01/23/19 1139 01/27/19 1547 01/29/19 0635 02/07/19 1018 02/20/19 1152   ITP Comments  Virtual Orientation with RN completed. Diagnosis can be found in [facility] 05/25/18. EP/RD orientation scheduled 8/10 at 2  Completed 6MWT, gym orientation, and RD evaluation. Initial ITP created and sent for review to Dr. Emily Filbert, Medical Director.  30 Day Review Completed today. Continue with ITP unless changed by Medical Director review.  New start to program  Weight up 2 pounds- some SOB noted by [name].  Advised call MD/ED if gets worse  Amaryah has complaint of nausea and had some clear emesis today. After talking with her-this has been a recurrent problem. She was advised to call her PMD and review her persistent symptoms.   [place] Name 02/26/19 [phone] 03/11/19  1114 03/26/19 1332 04/22/19 1025 04/23/19 0706   ITP Comments  30 Day review. Continue with ITP unless directed changes per Medical Director  review.  Genell has new anti nausea med  Davinia stated that she had some pulling pain in her chest on Sunday.  None today. She does see her physician this week to review the symptoms. Explained most likely post surgical healing.  No weights today.  30 day review completed. ITP sent to Dr. Emily Filbert, Medical Director of Cardiac and Pulmonary Rehab. Continue with ITP unless changes are made by physician.  Department closed starting 10/2 until further notice by infection prevention and Health at Work teams for [place].  Arrived with high BP readings. Sat for 5-8 min and BP did come down.  Had "Thanksgiving dinner" with family yesterday. Has taken all AM meds.  30 day review completed. Continue with ITP sent to Dr. Emily Filbert, Medical Director of Cardiac and Pulmonary Rehab for review , changes as needed and signature.   [place] Name 05/20/19 1327 05/21/19 1053         ITP Comments  Called to check on pt.  She is still coughing.  She hopes to return next week.  30 day review competed . ITP sent to Dr Emily Filbert for review, changes as needed and ITP approval signature.         Comments:

## 2019-05-27 ENCOUNTER — Encounter: Payer: Medicare Other | Attending: Cardiology

## 2019-05-27 DIAGNOSIS — E119 Type 2 diabetes mellitus without complications: Secondary | ICD-10-CM | POA: Insufficient documentation

## 2019-05-27 DIAGNOSIS — F329 Major depressive disorder, single episode, unspecified: Secondary | ICD-10-CM | POA: Insufficient documentation

## 2019-05-27 DIAGNOSIS — Z951 Presence of aortocoronary bypass graft: Secondary | ICD-10-CM | POA: Insufficient documentation

## 2019-05-27 DIAGNOSIS — Z791 Long term (current) use of non-steroidal anti-inflammatories (NSAID): Secondary | ICD-10-CM | POA: Insufficient documentation

## 2019-05-27 DIAGNOSIS — G4730 Sleep apnea, unspecified: Secondary | ICD-10-CM | POA: Insufficient documentation

## 2019-05-27 DIAGNOSIS — Z7982 Long term (current) use of aspirin: Secondary | ICD-10-CM | POA: Insufficient documentation

## 2019-05-27 DIAGNOSIS — E669 Obesity, unspecified: Secondary | ICD-10-CM | POA: Insufficient documentation

## 2019-05-27 DIAGNOSIS — E785 Hyperlipidemia, unspecified: Secondary | ICD-10-CM | POA: Insufficient documentation

## 2019-05-27 DIAGNOSIS — Z7902 Long term (current) use of antithrombotics/antiplatelets: Secondary | ICD-10-CM | POA: Insufficient documentation

## 2019-05-27 DIAGNOSIS — K219 Gastro-esophageal reflux disease without esophagitis: Secondary | ICD-10-CM | POA: Insufficient documentation

## 2019-05-27 DIAGNOSIS — F419 Anxiety disorder, unspecified: Secondary | ICD-10-CM | POA: Insufficient documentation

## 2019-05-27 DIAGNOSIS — Z794 Long term (current) use of insulin: Secondary | ICD-10-CM | POA: Insufficient documentation

## 2019-05-27 DIAGNOSIS — M1990 Unspecified osteoarthritis, unspecified site: Secondary | ICD-10-CM | POA: Insufficient documentation

## 2019-05-27 DIAGNOSIS — Z79899 Other long term (current) drug therapy: Secondary | ICD-10-CM | POA: Insufficient documentation

## 2019-05-29 ENCOUNTER — Telehealth: Payer: Self-pay

## 2019-05-29 NOTE — Telephone Encounter (Signed)
Zenovia Jarred plans to return tomorrow

## 2019-05-30 ENCOUNTER — Encounter: Payer: Medicare Other | Admitting: *Deleted

## 2019-05-30 ENCOUNTER — Other Ambulatory Visit: Payer: Self-pay

## 2019-05-30 DIAGNOSIS — Z951 Presence of aortocoronary bypass graft: Secondary | ICD-10-CM

## 2019-05-30 NOTE — Progress Notes (Signed)
Daily Session Note  Patient Details  Name: SMT. LODER MRN: [id number] Date of Birth: 01-Oct-1935 Referring Provider:     Cardiac Rehab from 01/27/2019 in [facility] Cardiac and Pulmonary Rehab  Referring Provider  Bartholome Bill MD      Encounter Date: 05/30/2019  Check In: Session Check In - 05/30/19 1026      Check-In   Supervising physician immediately available to respond to emergencies  See telemetry face sheet for immediately available ER MD    Location  [facility]-Cardiac & Pulmonary Rehab    Staff Present  Renita Papa, RN BSN;Jessica [place], MA, RCEP, CCRP, CCET;Amanda Sommer, [place], ACSM CEP, Exercise Physiologist    Virtual Visit  No    Medication changes reported      No    Fall or balance concerns reported     No    Warm-up and Cool-down  Performed on first and last piece of equipment    Resistance Training Performed  Yes    VAD Patient?  No    PAD/SET Patient?  No      Pain Assessment   Currently in Pain?  No/denies          Social History   Tobacco Use  Smoking Status Never Smoker  Smokeless Tobacco Never Used    Goals Met:  Independence with exercise equipment Exercise tolerated well No report of cardiac concerns or symptoms Strength training completed today  Goals Unmet:  Not Applicable  Comments: Pt able to follow exercise prescription today without complaint.  Will continue to monitor for progression.    Dr. Emily Filbert is Medical Director for [facility] and [facility].

## 2019-06-03 ENCOUNTER — Other Ambulatory Visit: Payer: Self-pay

## 2019-06-03 ENCOUNTER — Encounter: Payer: Medicare Other | Admitting: *Deleted

## 2019-06-03 DIAGNOSIS — K219 Gastro-esophageal reflux disease without esophagitis: Secondary | ICD-10-CM | POA: Diagnosis not present

## 2019-06-03 DIAGNOSIS — F419 Anxiety disorder, unspecified: Secondary | ICD-10-CM | POA: Diagnosis not present

## 2019-06-03 DIAGNOSIS — Z7902 Long term (current) use of antithrombotics/antiplatelets: Secondary | ICD-10-CM | POA: Diagnosis not present

## 2019-06-03 DIAGNOSIS — F329 Major depressive disorder, single episode, unspecified: Secondary | ICD-10-CM | POA: Diagnosis not present

## 2019-06-03 DIAGNOSIS — Z79899 Other long term (current) drug therapy: Secondary | ICD-10-CM | POA: Diagnosis not present

## 2019-06-03 DIAGNOSIS — E785 Hyperlipidemia, unspecified: Secondary | ICD-10-CM | POA: Diagnosis not present

## 2019-06-03 DIAGNOSIS — Z791 Long term (current) use of non-steroidal anti-inflammatories (NSAID): Secondary | ICD-10-CM | POA: Diagnosis not present

## 2019-06-03 DIAGNOSIS — Z951 Presence of aortocoronary bypass graft: Secondary | ICD-10-CM | POA: Diagnosis not present

## 2019-06-03 DIAGNOSIS — G4730 Sleep apnea, unspecified: Secondary | ICD-10-CM | POA: Diagnosis not present

## 2019-06-03 DIAGNOSIS — Z794 Long term (current) use of insulin: Secondary | ICD-10-CM | POA: Diagnosis not present

## 2019-06-03 DIAGNOSIS — M1990 Unspecified osteoarthritis, unspecified site: Secondary | ICD-10-CM | POA: Diagnosis not present

## 2019-06-03 DIAGNOSIS — E119 Type 2 diabetes mellitus without complications: Secondary | ICD-10-CM | POA: Diagnosis not present

## 2019-06-03 DIAGNOSIS — E669 Obesity, unspecified: Secondary | ICD-10-CM | POA: Diagnosis not present

## 2019-06-03 DIAGNOSIS — Z7982 Long term (current) use of aspirin: Secondary | ICD-10-CM | POA: Diagnosis not present

## 2019-06-03 NOTE — Progress Notes (Signed)
Daily Session Note  Patient Details  Name: Brandi [name] MRN: [id number] Date of Birth: 1936/04/05 Referring Provider:     Cardiac Rehab from 01/27/2019 in [facility] Cardiac and Pulmonary Rehab  Referring Provider  Bartholome Bill MD      Encounter Date: 06/03/2019  Check In: Session Check In - 06/03/19 1038      Check-In   Supervising physician immediately available to respond to emergencies  See telemetry face sheet for immediately available ER MD    Location  [facility]-Cardiac & Pulmonary Rehab    Staff Present  Heath Lark, RN, BSN, CCRP;Joseph Hood RCP,RRT,BSRT;Amanda Oletta Darter, [place], ACSM CEP, Exercise Physiologist    Virtual Visit  No    Medication changes reported      No    Fall or balance concerns reported     No    Warm-up and Cool-down  Performed on first and last piece of equipment    Resistance Training Performed  Yes    VAD Patient?  No    PAD/SET Patient?  No      Pain Assessment   Currently in Pain?  No/denies          Social History   Tobacco Use  Smoking Status Never Smoker  Smokeless Tobacco Never Used    Goals Met:  Independence with exercise equipment Exercise tolerated well No report of cardiac concerns or symptoms  Goals Unmet:  Not Applicable  Comments: Pt able to follow exercise prescription today without complaint.  Will continue to monitor for progression.    Dr. Emily Filbert is Medical Director for [place] and [facility].

## 2019-06-05 ENCOUNTER — Other Ambulatory Visit: Payer: Self-pay

## 2019-06-05 ENCOUNTER — Encounter: Payer: Medicare Other | Admitting: *Deleted

## 2019-06-05 DIAGNOSIS — Z951 Presence of aortocoronary bypass graft: Secondary | ICD-10-CM | POA: Diagnosis not present

## 2019-06-05 NOTE — Progress Notes (Signed)
Daily Session Note  Patient Details  Name: Brandi [name] MRN: [id number] Date of Birth: 11/20/1935 Referring Provider:     Cardiac Rehab from 01/27/2019 in [facility] Cardiac and Pulmonary Rehab  Referring Provider  Bartholome Bill MD      Encounter Date: 06/05/2019  Check In: Session Check In - 06/05/19 1049      Check-In   Supervising physician immediately available to respond to emergencies  See telemetry face sheet for immediately available ER MD    Location  [facility]-Cardiac & Pulmonary Rehab    Staff Present  Renita Papa, RN Vickki Hearing, BA, ACSM CEP, Exercise Physiologist;Joseph Tessie Fass RCP,RRT,BSRT    Virtual Visit  No    Medication changes reported      No    Fall or balance concerns reported     No    Warm-up and Cool-down  Performed on first and last piece of equipment    Resistance Training Performed  Yes    VAD Patient?  No    PAD/SET Patient?  No      Pain Assessment   Currently in Pain?  No/denies          Social History   Tobacco Use  Smoking Status Never Smoker  Smokeless Tobacco Never Used    Goals Met:  Independence with exercise equipment Exercise tolerated well No report of cardiac concerns or symptoms Strength training completed today  Goals Unmet:  Not Applicable  Comments: Pt able to follow exercise prescription today without complaint.  Will continue to monitor for progression.    Dr. Emily Filbert is Medical Director for [facility] and [facility].

## 2019-06-06 ENCOUNTER — Encounter: Payer: Medicare Other | Admitting: *Deleted

## 2019-06-06 DIAGNOSIS — Z951 Presence of aortocoronary bypass graft: Secondary | ICD-10-CM

## 2019-06-06 NOTE — Progress Notes (Signed)
Daily Session Note  Patient Details  Name: Brandi [name] MRN: [id number] Date of Birth: 03-Dec-1935 Referring Provider:     Cardiac Rehab from 01/27/2019 in [facility] Cardiac and Pulmonary Rehab  Referring Provider  Bartholome Bill MD      Encounter Date: 06/06/2019  Check In: Session Check In - 06/06/19 1020      Check-In   Supervising physician immediately available to respond to emergencies  See telemetry face sheet for immediately available ER MD    Location  [facility]-Cardiac & Pulmonary Rehab    Staff Present  Renita Papa, RN BSN;Jessica [place], MA, RCEP, CCRP, CCET;Amanda Sommer, [place], ACSM CEP, Exercise Physiologist    Virtual Visit  No    Medication changes reported      No    Fall or balance concerns reported     No    Warm-up and Cool-down  Performed on first and last piece of equipment    Resistance Training Performed  Yes    VAD Patient?  No    PAD/SET Patient?  No      Pain Assessment   Currently in Pain?  No/denies          Social History   Tobacco Use  Smoking Status Never Smoker  Smokeless Tobacco Never Used    Goals Met:  Independence with exercise equipment Exercise tolerated well No report of cardiac concerns or symptoms Strength training completed today  Goals Unmet:  Not Applicable  Comments: Pt able to follow exercise prescription today without complaint.  Will continue to monitor for progression.    Dr. Emily Filbert is Medical Director for [facility] and [facility].

## 2019-06-10 ENCOUNTER — Encounter: Payer: Self-pay | Admitting: *Deleted

## 2019-06-10 DIAGNOSIS — Z951 Presence of aortocoronary bypass graft: Secondary | ICD-10-CM

## 2019-06-10 NOTE — Progress Notes (Signed)
Cardiac Individual Treatment Plan  Patient Details  Name: Brandi [name] MRN: [id number] Date of Birth: September 19, 1935 Referring Provider:     Cardiac Rehab from 01/27/2019 in [facility] Cardiac and Pulmonary Rehab  Referring Provider  Bartholome Bill MD      Initial Encounter Date:    Cardiac Rehab from 01/27/2019 in [facility] Cardiac and Pulmonary Rehab  Date  01/27/19      Visit Diagnosis: S/P CABG x 3  Patient's Home Medications on Admission:  Current Outpatient Medications:  .  acetaminophen (TYLENOL) 500 MG tablet, Take by mouth., Disp: , Rfl:  .  albuterol (VENTOLIN HFA) 108 (90 Base) MCG/ACT inhaler, , Disp: , Rfl:  .  aspirin EC 81 MG tablet, Take 81 mg by mouth daily., Disp: , Rfl:  .  atorvastatin (LIPITOR) 40 MG tablet, Take by mouth., Disp: , Rfl:  .  budesonide-formoterol (SYMBICORT) 80-4.5 MCG/ACT inhaler, Inhale 2 puffs into the lungs 2 (two) times daily., Disp: , Rfl:  .  Calcium Carb-Cholecalciferol (CALCIUM + D3 PO), Take 1 tablet by mouth daily., Disp: , Rfl:  .  carboxymethylcellulose (REFRESH TEARS) 0.5 % SOLN, Apply 1-2 drops to eye at bedtime. , Disp: , Rfl:  .  carvedilol (COREG) 6.25 MG tablet, Take 6.25 mg by mouth 2 (two) times daily with a meal. , Disp: , Rfl:  .  clopidogrel (PLAVIX) 75 MG tablet, Take 75 mg by mouth daily., Disp: , Rfl:  .  conjugated estrogens (PREMARIN) vaginal cream, Place 1 Applicatorful vaginally daily. Apply 0.71m (pea-sized amount)  just inside the vaginal introitus with a finger-tip every night for two weeks and then Monday, Wednesday and Friday nights. (Patient not taking: Reported on 01/23/2019), Disp: 30 g, Rfl: 12 .  Cyanocobalamin (VITAMIN B-12 PO), Take 1 tablet by mouth daily., Disp: , Rfl:  .  gabapentin (NEURONTIN) 100 MG capsule, , Disp: , Rfl:  .  glipiZIDE (GLUCOTROL) 5 MG tablet, Take 5 mg by mouth 2 (two) times daily. , Disp: , Rfl:  .  hydrochlorothiazide (HYDRODIURIL) 12.5 MG tablet, , Disp: , Rfl: 1 .  Insulin Glargine (LANTUS  SOLOSTAR) 100 UNIT/ML Solostar Pen, Inject 12 Units into the skin at bedtime. , Disp: , Rfl:  .  JANUMET XR 50-1000 MG TB24, Take 1 tablet by mouth 2 (two) times daily., Disp: , Rfl:  .  losartan (COZAAR) 100 MG tablet, Take 100 mg by mouth daily., Disp: , Rfl:  .  meloxicam (MOBIC) 15 MG tablet, Take 15 mg by mouth daily., Disp: , Rfl:  .  mirabegron ER (MYRBETRIQ) 25 MG TB24 tablet, Take 1 tablet (25 mg total) by mouth daily., Disp: 90 tablet, Rfl: 3 .  Multiple Vitamins-Minerals (PRESERVISION/LUTEIN) CAPS, Take 1 capsule by mouth 2 (two) times daily. , Disp: , Rfl:  .  pantoprazole (PROTONIX) 40 MG tablet, Take 40 mg by mouth daily., Disp: , Rfl:  .  prasugrel (EFFIENT) 10 MG TABS tablet, Take 10 mg by mouth daily., Disp: , Rfl:  .  pregabalin (LYRICA) 75 MG capsule, Take 75 mg by mouth 2 (two) times daily., Disp: , Rfl:  .  senna-docusate (SENOKOT-S) 8.6-50 MG tablet, Take 1 tablet by mouth at bedtime as needed for mild constipation., Disp: 10 tablet, Rfl: 0 .  simvastatin (ZOCOR) 20 MG tablet, Take 20 mg by mouth daily at 6 PM. , Disp: , Rfl:  .  SitaGLIPtin-MetFORMIN HCl (JANUMET XR) 50-1000 MG TB24, Take 1 tablet by mouth 2 (two) times daily., Disp: , Rfl:  .  sodium chloride (ALTAMIST SPRAY) 0.65 % nasal spray, Place into the nose., Disp: , Rfl:  .  traZODone (DESYREL) 50 MG tablet, , Disp: , Rfl:  .  venlafaxine (EFFEXOR) 37.5 MG tablet, , Disp: , Rfl:  .  Verapamil HCl CR 300 MG CP24, Take 1 capsule by mouth daily., Disp: , Rfl:   Past Medical History: Past Medical History:  Diagnosis Date  . Anemia   . Anxiety   . Arthritis, degenerative   . Chronic cystitis   . Depression   . Diabetes mellitus without complication ([place])   . GERD (gastroesophageal reflux disease)   . Headache   . Heart disease   . HLD (hyperlipidemia)   . HTN (hypertension)   . Hypertension   . Incontinence in female   . Murmur   . Obesity   . Sleep apnea   . Urge incontinence of urine     Tobacco  Use: Social History   Tobacco Use  Smoking Status Never Smoker  Smokeless Tobacco Never Used    Labs: Recent Review Flowsheet Data    Labs for ITP Cardiac and Pulmonary Rehab Latest Ref Rng & Units 06/30/2011 04/16/2016   Cholestrol 0 - 200 mg/dL 141 -   LDLCALC 0 - 100 mg/dL 64 -   HDL 40 - 60 mg/dL 63(H) -   Trlycerides 0 - 200 mg/dL 71 -   Hemoglobin A1c 4.8 - 5.6 % 7.7(H) 7.3(H)       Exercise Target Goals: Exercise Program Goal: Individual exercise prescription set using results from initial 6 min walk test and THRR while considering  patient's activity barriers and safety.   Exercise Prescription Goal: Initial exercise prescription builds to 30-45 minutes a day of aerobic activity, 2-3 days per week.  Home exercise guidelines will be given to patient during program as part of exercise prescription that the participant will acknowledge.  Activity Barriers & Risk Stratification: Activity Barriers & Cardiac Risk Stratification - 01/27/19 1613      Activity Barriers & Cardiac Risk Stratification   Activity Barriers  Back Problems;Arthritis;Joint Problems;Deconditioning;Balance Concerns;Muscular Weakness;Shortness of Breath;Assistive Device;History of Falls    Cardiac Risk Stratification  High       6 Minute Walk: 6 Minute Walk    Row Name 01/27/19 1612         6 Minute Walk   Phase  Initial     Distance  620 feet     Walk Time  5.5 minutes     # of Rest Breaks  0     MPH  1.28     METS  1.15     RPE  17     Perceived Dyspnea   2     VO2 Peak  4.02     Symptoms  Yes (comment)     Comments  winded, fatigue     Resting HR  81 bpm     Resting BP  142/74     Resting Oxygen Saturation   97 %     Exercise Oxygen Saturation  during 6 min walk  96 %     Max Ex. HR  134 bpm     Max Ex. BP  156/76     2 Minute Post BP  142/70        Oxygen Initial Assessment:   Oxygen Re-Evaluation:   Oxygen Discharge (Final Oxygen Re-Evaluation):   Initial Exercise  Prescription: Initial Exercise Prescription - 01/27/19 1600      Date of  Initial Exercise RX and Referring Provider   Date  01/27/19    Referring Provider  Bartholome Bill MD      Recumbant Bike   Level  1    RPM  50    Watts  5    Minutes  15    METs  1.1      NuStep   Level  1    SPM  80    Minutes  15    METs  1      Arm Ergometer   Level  1    RPM  25    Minutes  15    METs  1      Recumbant Elliptical   Level  1    RPM  50    Minutes  15    METs  1      REL-XR   Level  1    Speed  50    Minutes  15    METs  1.2      T5 Nustep   Level  1    SPM  80    Minutes  15    METs  1      Biostep-RELP   Level  1    SPM  50    Minutes  15    METs  1      Prescription Details   Frequency (times per week)  3    Duration  Progress to 30 minutes of continuous aerobic without signs/symptoms of physical distress      Intensity   THRR 40-80% of Max Heartrate  105-128    Ratings of Perceived Exertion  11-13    Perceived Dyspnea  0-4      Progression   Progression  Continue to progress workloads to maintain intensity without signs/symptoms of physical distress.      Resistance Training   Training Prescription  Yes    Weight  2 lbs    Reps  10-15       Perform Capillary Blood Glucose checks as needed.  Exercise Prescription Changes: Exercise Prescription Changes    Row Name 01/27/19 1600 02/06/19 1500 02/18/19 1200 03/04/19 1400 03/19/19 1300     Response to Exercise   Blood Pressure (Admit)  142/74  122/72  124/80  122/70  154/80   Blood Pressure (Exercise)  156/76  148/74  136/80  138/70  160/84   Blood Pressure (Exit)  142/70  128/88  130/80  138/74  150/80   Heart Rate (Admit)  81 bpm  76 bpm  83 bpm  80 bpm  88 bpm   Heart Rate (Exercise)  134 bpm  111 bpm  112 bpm  95 bpm  114 bpm   Heart Rate (Exit)  83 bpm  84 bpm  76 bpm  48 bpm  70 bpm   Oxygen Saturation (Admit)  97 %  --  --  --  --   Oxygen Saturation (Exercise)  96 %  --  --  --  --    Rating of Perceived Exertion (Exercise)  _0 Perceived Dyspnea (Exercise)  2  --  --  --  --   Symptoms  winded, fatigue  fatigue  fatigue  nausea  --   Comments  walk test results  --  --  --  --   Duration  --  Continue with 30 min of aerobic exercise without signs/symptoms  of physical distress.  Continue with 30 min of aerobic exercise without signs/symptoms of physical distress.  Continue with 30 min of aerobic exercise without signs/symptoms of physical distress.  Continue with 30 min of aerobic exercise without signs/symptoms of physical distress.   Intensity  --  THRR unchanged  THRR unchanged  THRR unchanged  THRR unchanged     Progression   Progression  --  Continue to progress workloads to maintain intensity without signs/symptoms of physical distress.  Continue to progress workloads to maintain intensity without signs/symptoms of physical distress.  Continue to progress workloads to maintain intensity without signs/symptoms of physical distress.  Continue to progress workloads to maintain intensity without signs/symptoms of physical distress.   Average METs  --  1.57  1.27  1.6  1.95     Resistance Training   Training Prescription  --  Yes  Yes  Yes  Yes   Weight  --  3 lbs  3 lbs  3 lbs  3 lb   Reps  --  10-15  10-15  10-15  10-15     Interval Training   Interval Training  --  No  No  No  No     NuStep   Level  --  --  _0 Minutes  --  --  _1 METs  --  --  1.8  1.6  1.9     Arm Ergometer   Level  --  1  1  --  --   Minutes  --  15  15  --  --   METs  --  1.2  1  --  --     Recumbant Elliptical   Level  --  --  --  --  -- hurt arms - stayed on T4     REL-XR   Level  --  1  --  1  --   Minutes  --  15  --  15  --   METs  --  1  --  --  --     T5 Nustep   Level  --  1  --  --  --   Minutes  --  15  --  --  --   METs  --  1.7  --  --  --     Biostep-RELP   Level  --  1  1  --  --   Minutes  --  15  15  --  --   METs  --  2  1  --   --     Home Exercise Plan   Plans to continue exercise at  --  --  Home (comment) walking  Home (comment) walking  Home (comment) walking   Frequency  --  --  Add 1 additional day to program exercise sessions.  Add 1 additional day to program exercise sessions.  Add 1 additional day to program exercise sessions.   Initial Home Exercises Provided  --  --  02/14/19  02/14/19  02/14/19   Row Name 04/01/19 1400 04/16/19 1400 04/30/19 1300 05/12/19 1300 06/09/19 1600     Response to Exercise   Blood Pressure (Admit)  126/90  136/76  166/84  132/70  182/96   Blood Pressure (Exercise)  158/88  177/88  178/84  190/92  130/72   Blood Pressure (Exit)  168/76  134/78  158/84  144/66  138/76  Heart Rate (Admit)  80 bpm  108 bpm  86 bpm  106 bpm  78 bpm   Heart Rate (Exercise)  114 bpm  122 bpm  126 bpm  113 bpm  103 bpm   Heart Rate (Exit)  85 bpm  92 bpm  90 bpm  101 bpm  84 bpm   Rating of Perceived Exertion (Exercise)  _0 Symptoms  none  none  none  none  none   Duration  Continue with 30 min of aerobic exercise without signs/symptoms of physical distress.  Continue with 30 min of aerobic exercise without signs/symptoms of physical distress.  Continue with 30 min of aerobic exercise without signs/symptoms of physical distress.  Continue with 30 min of aerobic exercise without signs/symptoms of physical distress.  Continue with 30 min of aerobic exercise without signs/symptoms of physical distress.   Intensity  THRR unchanged  THRR unchanged  THRR unchanged  THRR unchanged  THRR unchanged     Progression   Progression  Continue to progress workloads to maintain intensity without signs/symptoms of physical distress.  Continue to progress workloads to maintain intensity without signs/symptoms of physical distress.  Continue to progress workloads to maintain intensity without signs/symptoms of physical distress.  Continue to progress workloads to maintain intensity without signs/symptoms  of physical distress.  Continue to progress workloads to maintain intensity without signs/symptoms of physical distress.   Average METs  2.41  1.62  1.57  1.85  1.75     Resistance Training   Training Prescription  Yes  Yes  Yes  Yes  Yes   Weight  3 lbs  3 lbs  3 lb  3 lb  3 lb   Reps  10-15  10-15  10-15  10-15  10-15     Interval Training   Interval Training  No  No  No  No  No     Recumbant Bike   Level  1  --  --  --  --   Watts  13  --  --  --  --   Minutes  15  --  --  --  --   METs  2.52  --  --  --  --     NuStep   Level  2  --  3  --  3   Minutes  15  --  15  --  15   METs  2.1  --  1.9  --  1.6     Arm Ergometer   Level  1  1  --  1  1   Minutes  15  15  --  15  15   METs  1.3  1.2  --  1.7  1.7     Recumbant Elliptical   Level  1  --  --  --  --   Minutes  15  --  --  --  --   METs  3.2  --  --  --  --     REL-XR   Level  --  1  1  --  --   Minutes  --  15  15  --  --   METs  --  1.5  1  --  --     T5 Nustep   Level  --  1  1  --  1   Minutes  --  15  --  --  15   METs  --  1.8  1.8  --  1.7     Biostep-RELP   Level  _0 Minutes  _1 METs  _2 Home Exercise Plan   Plans to continue exercise at  Home (comment) walking  Home (comment) walking  Home (comment) walking  Home (comment) walking  Home (comment) walking   Frequency  Add 1 additional day to program exercise sessions.  Add 1 additional day to program exercise sessions.  Add 2 additional days to program exercise sessions.  Add 2 additional days to program exercise sessions.  Add 2 additional days to program exercise sessions.   Initial Home Exercises Provided  02/14/19  02/14/19  02/14/19  02/14/19  02/14/19      Exercise Comments: Exercise Comments    Row Name 01/28/19 1026 02/07/19 1018 02/20/19 1153 03/11/19 1114     Exercise Comments  First full day of exercise!  Patient was oriented to gym and equipment including functions, settings, policies,  and procedures.  Patient's individual exercise prescription and treatment plan were reviewed.  All starting workloads were established based on the results of the 6 minute walk test done at initial orientation visit.  The plan for exercise progression was also introduced and progression will be customized based on patient's performance and goals.  Weight up 2 pounds- some SOB noted by Brandi [name].  Advised call MD/ED if gets worse  Brandi [name] has complaint of nausea and had some clear emesis today. After talking with her-this has been a recurrent problem. She was advised to call her PMD and review her persistent symptoms.  Brandi [name] stated that she had some pulling pain in her chest on Sunday.  None today. She does see her physician this week to review the symptoms. Explained most likely post surgical healing.  No weights today.       Exercise Goals and Review: Exercise Goals    Row Name 01/27/19 1617             Exercise Goals   Increase Physical Activity  Yes       Intervention  Provide advice, education, support and counseling about physical activity/exercise needs.;Develop an individualized exercise prescription for aerobic and resistive training based on initial evaluation findings, risk stratification, comorbidities and participant's personal goals.       Expected Outcomes  Short Term: Attend rehab on a regular basis to increase amount of physical activity.;Long Term: Add in home exercise to make exercise part of routine and to increase amount of physical activity.;Long Term: Exercising regularly at least 3-5 days a week.       Increase Strength and Stamina  Yes       Intervention  Provide advice, education, support and counseling about physical activity/exercise needs.;Develop an individualized exercise prescription for aerobic and resistive training based on initial evaluation findings, risk stratification, comorbidities and participant's personal goals.       Expected Outcomes  Short Term: Increase  workloads from initial exercise prescription for resistance, speed, and METs.;Short Term: Perform resistance training exercises routinely during rehab and add in resistance training at home;Long Term: Improve cardiorespiratory fitness, muscular endurance and strength as measured by increased METs and functional capacity (6MWT)       Able to understand and use rate of perceived exertion (RPE) scale  Yes  Intervention  Provide education and explanation on how to use RPE scale       Expected Outcomes  Long Term:  Able to use RPE to guide intensity level when exercising independently;Short Term: Able to use RPE daily in rehab to express subjective intensity level       Able to understand and use Dyspnea scale  Yes       Intervention  Provide education and explanation on how to use Dyspnea scale       Expected Outcomes  Short Term: Able to use Dyspnea scale daily in rehab to express subjective sense of shortness of breath during exertion;Long Term: Able to use Dyspnea scale to guide intensity level when exercising independently       Knowledge and understanding of Target Heart Rate Range (THRR)  Yes       Intervention  Provide education and explanation of THRR including how the numbers were predicted and where they are located for reference       Expected Outcomes  Short Term: Able to state/look up THRR;Short Term: Able to use daily as guideline for intensity in rehab;Long Term: Able to use THRR to govern intensity when exercising independently       Able to check pulse independently  Yes       Intervention  Provide education and demonstration on how to check pulse in carotid and radial arteries.;Review the importance of being able to check your own pulse for safety during independent exercise       Expected Outcomes  Short Term: Able to explain why pulse checking is important during independent exercise;Long Term: Able to check pulse independently and accurately       Understanding of Exercise  Prescription  Yes       Intervention  Provide education, explanation, and written materials on patient's individual exercise prescription       Expected Outcomes  Short Term: Able to explain program exercise prescription;Long Term: Able to explain home exercise prescription to exercise independently          Exercise Goals Re-Evaluation : Exercise Goals Re-Evaluation    Row Name 01/28/19 1028 02/06/19 1541 02/14/19 1032 02/18/19 1253 03/04/19 1341     Exercise Goal Re-Evaluation   Exercise Goals Review  Increase Physical Activity;Increase Strength and Stamina;Able to understand and use rate of perceived exertion (RPE) scale;Able to understand and use Dyspnea scale;Knowledge and understanding of Target Heart Rate Range (THRR);Understanding of Exercise Prescription  Increase Physical Activity;Increase Strength and Stamina;Understanding of Exercise Prescription  Increase Physical Activity;Increase Strength and Stamina;Able to understand and use rate of perceived exertion (RPE) scale;Knowledge and understanding of Target Heart Rate Range (THRR);Able to check pulse independently  Increase Physical Activity;Increase Strength and Stamina;Able to understand and use rate of perceived exertion (RPE) scale;Knowledge and understanding of Target Heart Rate Range (THRR);Able to check pulse independently  --   Comments  Reviewed RPE scale, THR and program prescription with pt today.  Pt voiced understanding and was given a copy of goals to take home.  Brandi [name] is off to a good start in rehab.  She getting in all 30 of her minutes much to her surprise.  We will continue to monitor her progress.  Brandi [name] has a stationary bike at home.  She plans to start by adding one day a week in addition to HT sessions.  Brandi [name] is doing well in rehab.  She is now on level 2 on the NuStep.  We will continue to monitor  her progress.  Brandi [name] continues to do well in rehab.  She had an epsiode last week of nausea in class and was not  able to go continuously like she had been doing.  She is doing well on the NuStep.  We will continue to monitor her progress.   Expected Outcomes  Short: Use RPE daily to regulate intensity. Long: Follow program prescription in THR.  Short: Continue to attend class regulalry.  Long: Continue to follow program prescription.  Short - add one exrecise session in addition to HT  Short: Continue to increase speed on the BioStep.  Long: Continue to add in exercise at home on off days.  Short: Continue to increase workloads.  Long: Continue to exercise at home.   Brandi [name] Name 03/19/19 1340 04/01/19 1414 04/11/19 1025 04/16/19 1455 04/30/19 1332     Exercise Goal Re-Evaluation   Exercise Goals Review  Increase Physical Activity;Increase Strength and Stamina;Able to understand and use rate of perceived exertion (RPE) scale;Knowledge and understanding of Target Heart Rate Range (THRR);Able to check pulse independently;Understanding of Exercise Prescription  Increase Physical Activity;Increase Strength and Stamina;Understanding of Exercise Prescription  Increase Physical Activity;Increase Strength and Stamina  Increase Physical Activity;Increase Strength and Stamina;Understanding of Exercise Prescription  Increase Physical Activity;Increase Strength and Stamina;Understanding of Exercise Prescription   Comments  Brandi [name] attends consistently.  She has had less nausea since starting a new med from her Dr.  Zenovia [name] has been doing well in rehab.  She is up to 13 watts on the bike.  We will continue to monitor her progress.  When she is not at rehab she is out shopping and doing chores at home. She states that her neighborhood does not have sidewalks and the ground is uneven so she does not walk.  Brandi [name] is doing well in rehab.  She is holding steady with her METs and RPEs usually between 12-13.  We will continue to monitor her progress.  Brandi [name] continues to do well in rehab.  She is nearing graduation and will be doing her  post 6MWT next week.  She likes the Nustep and XR.  We will continue to monitor her progress.   Expected Outcomes  Short - continue to attend consistently Long - maintain regular exercise routine  Short: Increase BioStep.  Long: Continue to improve stamina.  Short:come up with a home workout plan. Long: maintain exercise at home independently.  Short: Continue to attend regularly  Long: Find home exercise routine.  Short: Improve post 6MWT.  Long: Continue to exercise independently.   [place] Name 05/02/19 1030 05/12/19 1338 06/09/19 1653         Exercise Goal Re-Evaluation   Exercise Goals Review  Increase Physical Activity;Increase Strength and Stamina;Understanding of Exercise Prescription  --  Increase Physical Activity;Increase Strength and Stamina;Understanding of Exercise Prescription     Comments  Brandi [name] is doing well.  She is planning to use her recumbent bike at home for after graduation.  This last week she was decorating for Christmas.  Brandi [name] was out sick last week.  She has only attended once since last review.  Brandi [name] will graduate on Thursday.  She is going to continue to use our videos and walking to exercise at home.     Expected Outcomes  Short: Improve post 6MWT.  Long: Continue to exercise independently.  Short: Improve post 6MWT.  Long: Continue to exercise independently.  Short: Improve post 6MWT.  Long: Continue to exercise independently.  Discharge Exercise Prescription (Final Exercise Prescription Changes): Exercise Prescription Changes - 06/09/19 1600      Response to Exercise   Blood Pressure (Admit)  182/96    Blood Pressure (Exercise)  130/72    Blood Pressure (Exit)  138/76    Heart Rate (Admit)  78 bpm    Heart Rate (Exercise)  103 bpm    Heart Rate (Exit)  84 bpm    Rating of Perceived Exertion (Exercise)  13    Symptoms  none    Duration  Continue with 30 min of aerobic exercise without signs/symptoms of physical distress.    Intensity  THRR unchanged       Progression   Progression  Continue to progress workloads to maintain intensity without signs/symptoms of physical distress.    Average METs  1.75      Resistance Training   Training Prescription  Yes    Weight  3 lb    Reps  10-15      Interval Training   Interval Training  No      NuStep   Level  3    Minutes  15    METs  1.6      Arm Ergometer   Level  1    Minutes  15    METs  1.7      T5 Nustep   Level  1    Minutes  15    METs  1.7      Biostep-RELP   Level  1    Minutes  15    METs  2      Home Exercise Plan   Plans to continue exercise at  Home (comment)   walking   Frequency  Add 2 additional days to program exercise sessions.    Initial Home Exercises Provided  02/14/19       Nutrition:  Target Goals: Understanding of nutrition guidelines, daily intake of sodium <1548m, cholesterol <2010m calories 30% from fat and 7% or less from saturated fats, daily to have 5 or more servings of fruits and vegetables.  Biometrics: Pre Biometrics - 01/27/19 1617      Pre Biometrics   Height  _0  (1.549 m)    Weight  198 lb 6.4 oz (90 kg)    BMI (Calculated)  37.51    Single Leg Stand  0.37 seconds        Nutrition Therapy Plan and Nutrition Goals: Nutrition Therapy & Goals - 01/27/19 1526      Nutrition Therapy   Diet  Low Na, HH, DM diet    Drug/Food Interactions  Statins/Certain Fruits   lipitor   Protein (specify units)  75g    Fiber  25 grams    Whole Grain Foods  3 servings    Saturated Fats  12 max. grams    Fruits and Vegetables  5 servings/day    Sodium  1.5 grams      Personal Nutrition Goals   Nutrition Goal  ST: increase vegetable intake (+1 serving 3x/week) LT: walk again and get rid of cane.    Comments  Lantus, janumet, glipizidesenokot, coregCa + D3 A1c 10/2018 was 6.7 (WNL). Pt reports for B she eats grits, eggos, special K or cinnamon cheerios with whole milk. L tomato sandwich with full fat mayo and white bread. Dinner is  chicken with the skin and/or bacon, baked pork chops and instant potatoes or white pasta. Pt will use margarine, high fat protein, whole fat  dairy, fried foods at least 2x/week, minimal F and V. Pt suggested she needed to eat more vegetables, but would still like to review the content. Discussed HH DM eating.      Intervention Plan   Intervention  Prescribe, educate and counsel regarding individualized specific dietary modifications aiming towards targeted core components such as weight, hypertension, lipid management, diabetes, heart failure and other comorbidities.;Nutrition handout(s) given to patient.    Expected Outcomes  Short Term Goal: Understand basic principles of dietary content, such as calories, fat, sodium, cholesterol and nutrients.;Long Term Goal: Adherence to prescribed nutrition plan.;Short Term Goal: A plan has been developed with personal nutrition goals set during dietitian appointment.       Nutrition Assessments: Nutrition Assessments - 05/30/19 1044      MEDFICTS Scores   Post Score  43       Nutrition Goals Re-Evaluation: Nutrition Goals Re-Evaluation    Row Name 02/11/19 1034 03/11/19 1044 04/22/19 1138 06/05/19 1036       Goals   Current Weight  198 lb 6.4 oz (90 kg)  --  --  --    Nutrition Goal  ST: increase vegetable intake (+1 serving everyday/week) LT: walk again and get rid of cane.  ST: Try to eat broccoli 2x week LT: walk again and get rid of cane.  ST: Try to eat broccoli 2x week LT: walk again and get rid of cane.  ST: Try to eat broccoli (or other new vegetable) 2x week LT: walk again and get rid of cane.    Comment  Pt reports now eating lima beans and corn 3x/week, now moving to everyday for next goal. Discussed the importance of variety in the diet and in vegetables. Pt asked about saturated vs unsaturated fat, discussed what they are and what some healthy swaps would be.  Pt reports now eating lima beans and corn lunch and dinner. Discussed the  importance of variety in the diet and in vegetables. pt reports not liking greens but likes broccoli but hasnt had it in a while.  Continue with current changes  Now eating asparagus with bacon. Son's girlfriend has been cooking more vegetables. Will continue to make chnages eveyr two weeks (thinks 1 month is too long)    Expected Outcome  ST: increase vegetable intake (+1 serving everyday/week) LT: walk again and get rid of cane.  ST: Try to eat broccoli 2x week LT: walk again and get rid of cane.  ST: Try to eat broccoli 2x week LT: walk again and get rid of cane.  ST: Try to eat broccoli (or other new vegetable) 2x week LT: walk again and get rid of cane.       Nutrition Goals Discharge (Final Nutrition Goals Re-Evaluation): Nutrition Goals Re-Evaluation - 06/05/19 1036      Goals   Nutrition Goal  ST: Try to eat broccoli (or other new vegetable) 2x week LT: walk again and get rid of cane.    Comment  Now eating asparagus with bacon. Son's girlfriend has been cooking more vegetables. Will continue to make chnages eveyr two weeks (thinks 1 month is too long)    Expected Outcome  ST: Try to eat broccoli (or other new vegetable) 2x week LT: walk again and get rid of cane.       Psychosocial: Target Goals: Acknowledge presence or absence of significant depression and/or stress, maximize coping skills, provide positive support system. Participant is able to verbalize types and ability to use techniques and  skills needed for reducing stress and depression.   Initial Review & Psychosocial Screening: Initial Psych Review & Screening - 01/23/19 1151      Initial Review   Current issues with  Current Sleep Concerns;Current Stress Concerns    Comments  Micaylah is trying to regulate her sleep still from her admission in December. She is taking trazodone, but is still drowsy most of the morning so she is trying to slowly come off of it. She reported her only source of stress right now is Covid. She lives  with 2 of her children, so they have been making sure she has everything she needs. She has not been out much since March.      Family Dynamics   Good Support System?  Yes      Barriers   Psychosocial barriers to participate in program  There are no identifiable barriers or psychosocial needs.;The patient should benefit from training in stress management and relaxation.      Screening Interventions   Interventions  Encouraged to exercise;Provide feedback about the scores to participant    Expected Outcomes  Short Term goal: Utilizing psychosocial counselor, staff and physician to assist with identification of specific Stressors or current issues interfering with healing process. Setting desired goal for each stressor or current issue identified.;Long Term Goal: Stressors or current issues are controlled or eliminated.;Short Term goal: Identification and review with participant of any Quality of Life or Depression concerns found by scoring the questionnaire.;Long Term goal: The participant improves quality of Life and PHQ9 Scores as seen by post scores and/or verbalization of changes       Quality of Life Scores:  Quality of Life - 05/30/19 1041      Quality of Life Scores   Health/Function Pre  17.58 %    Health/Function Post  17.96 %    Health/Function % Change  2.16 %    Socioeconomic Pre  20.86 %    Socioeconomic Post  25.86 %    Socioeconomic % Change   23.97 %    Psych/Spiritual Pre  20 %    Psych/Spiritual Post  21.21 %    Psych/Spiritual % Change  6.05 %    Family Pre  23.63 %    Family Post  27 %    Family % Change  14.26 %    GLOBAL Pre  19.72 %    GLOBAL Post  21.53 %    GLOBAL % Change  9.18 %      Scores of 19 and below usually indicate a poorer quality of life in these areas.  A difference of  2-3 points is a clinically meaningful difference.  A difference of 2-3 points in the total score of the Quality of Life Index has been associated with significant improvement in  overall quality of life, self-image, physical symptoms, and general health in studies assessing change in quality of life.  PHQ-9: Recent Review Flowsheet Data    Depression screen [facility] 2/9 05/30/2019 02/27/2019 01/27/2019   Decreased Interest 0 0 1   Down, Depressed, Hopeless 0 0 1   PHQ - 2 Score 0 0 2   Altered sleeping 1 0 2   Tired, decreased energy _0 Change in appetite _1 Feeling bad or failure about yourself  1 0 1   Trouble concentrating 0 0 0   Moving slowly or fidgety/restless 0 1 0   Suicidal thoughts 0 0 0   PHQ-9  Score _0 Difficult doing work/chores Not difficult at all Not difficult at all Somewhat difficult     Interpretation of Total Score  Total Score Depression Severity:  1-4 = Minimal depression, 5-9 = Mild depression, 10-14 = Moderate depression, 15-19 = Moderately severe depression, 20-27 = Severe depression   Psychosocial Evaluation and Intervention:   Psychosocial Re-Evaluation: Psychosocial Re-Evaluation    [place] Name 03/07/19 1029 04/11/19 1028 05/02/19 1031         Psychosocial Re-Evaluation   Current issues with  Current Sleep Concerns;History of Depression;Current Stress Concerns  Current Sleep Concerns;History of Depression;Current Stress Concerns  Current Sleep Concerns;History of Depression;Current Stress Concerns     Comments  She had nausea last night whick did not help her sleeping routine. She is able to sleep with her sleeping medication but is groggy in the morning. She is going to the doctors next week to talk about cutting back on her medications to help her to not be so groggy.  Brandi [name] is taking medication to help her sleep. She does get up during the night at times to go to the bathroom. Her two sons live with her and help her get to places and run errends when she needs. Her nausea has been better this past week. She has been dizzy and it concerns her because she does not want to fall. Her disszy spells are stressing her out  because she does not want to fall.  This week she decorated for Christmas as she missed it last year.  She was also able to go shopping at [employer]!  She is doing well with self care.  Overall she is doing well.     Expected Outcomes  Short: talk with her doctor about sleeping medications. Long: maintain a good sleeping routine.  Short: Attend [facility] stress management education to decrease stress. Long: Maintain exercise Post HeartTrack to keep stress at a minimum.  Short: Continue to practice self care and enjoy Christmas.  Long: Continue stay positive.     Interventions  Encouraged to attend Cardiac Rehabilitation for the exercise  Encouraged to attend Cardiac Rehabilitation for the exercise  --     Continue Psychosocial Services   Follow up required by staff  Follow up required by staff  --        Psychosocial Discharge (Final Psychosocial Re-Evaluation): Psychosocial Re-Evaluation - 05/02/19 1031      Psychosocial Re-Evaluation   Current issues with  Current Sleep Concerns;History of Depression;Current Stress Concerns    Comments  This week she decorated for Christmas as she missed it last year.  She was also able to go shopping at [employer] [place]!  She is doing well with self care.  Overall she is doing well.    Expected Outcomes  Short: Continue to practice self care and enjoy Christmas.  Long: Continue stay positive.       Vocational Rehabilitation: Provide vocational rehab assistance to qualifying candidates.   Vocational Rehab Evaluation & Intervention: Vocational Rehab - 01/23/19 1143      Initial Vocational Rehab Evaluation & Intervention   Assessment shows need for Vocational Rehabilitation  No       Education: Education Goals: Education classes will be provided on a variety of topics geared toward better understanding of heart health and risk factor modification. Participant will state understanding/return demonstration of topics presented as noted by education test  scores.  Learning Barriers/Preferences: Learning Barriers/Preferences - 01/23/19 1143      Learning Barriers/Preferences  Learning Barriers  None    Learning Preferences  Individual Instruction       Education Topics:  AED/CPR: - Group verbal and written instruction with the use of models to demonstrate the basic use of the AED with the basic ABC's of resuscitation.   General Nutrition Guidelines/Fats and Fiber: -Group instruction provided by verbal, written material, models and posters to present the general guidelines for heart healthy nutrition. Gives an explanation and review of dietary fats and fiber.   Controlling Sodium/Reading Food Labels: -Group verbal and written material supporting the discussion of sodium use in heart healthy nutrition. Review and explanation with models, verbal and written materials for utilization of the food label.   Exercise Physiology & General Exercise Guidelines: - Group verbal and written instruction with models to review the exercise physiology of the cardiovascular system and associated critical values. Provides general exercise guidelines with specific guidelines to those with heart or lung disease.    Aerobic Exercise & Resistance Training: - Gives group verbal and written instruction on the various components of exercise. Focuses on aerobic and resistive training programs and the benefits of this training and how to safely progress through these programs..   Cardiac Rehab from 04/24/2019 in [facility] Cardiac and Pulmonary Rehab  Date  04/24/19  Educator  jh  Instruction Review Code  1- Verbalizes Understanding      Flexibility, Balance, Mind/Body Relaxation: Provides group verbal/written instruction on the benefits of flexibility and balance training, including mind/body exercise modes such as yoga, pilates and tai chi.  Demonstration and skill practice provided.   Stress and Anxiety: - Provides group verbal and written instruction about  the health risks of elevated stress and causes of high stress.  Discuss the correlation between heart/lung disease and anxiety and treatment options. Review healthy ways to manage with stress and anxiety.   Depression: - Provides group verbal and written instruction on the correlation between heart/lung disease and depressed mood, treatment options, and the stigmas associated with seeking treatment.   Anatomy & Physiology of the Heart: - Group verbal and written instruction and models provide basic cardiac anatomy and physiology, with the coronary electrical and arterial systems. Review of Valvular disease and Heart Failure   Cardiac Procedures: - Group verbal and written instruction to review commonly prescribed medications for heart disease. Reviews the medication, class of the drug, and side effects. Includes the steps to properly store meds and maintain the prescription regimen. (beta blockers and nitrates)   Cardiac Rehab from 04/24/2019 in [facility] Cardiac and Pulmonary Rehab  Date  04/24/19  Educator  mc  Instruction Review Code  1- Verbalizes Understanding      Cardiac Medications I: - Group verbal and written instruction to review commonly prescribed medications for heart disease. Reviews the medication, class of the drug, and side effects. Includes the steps to properly store meds and maintain the prescription regimen.   Cardiac Medications II: -Group verbal and written instruction to review commonly prescribed medications for heart disease. Reviews the medication, class of the drug, and side effects. (all other drug classes)    Go Sex-Intimacy & Heart Disease, Get SMART - Goal Setting: - Group verbal and written instruction through game format to discuss heart disease and the return to sexual intimacy. Provides group verbal and written material to discuss and apply goal setting through the application of the S.M.A.R.T. Method.   Cardiac Rehab from 04/24/2019 in [facility] Cardiac and  Pulmonary Rehab  Date  04/24/19  Educator  mc  Instruction Review Code  1- Verbalizes Understanding      Other Matters of the Heart: - Provides group verbal, written materials and models to describe Stable Angina and Peripheral Artery. Includes description of the disease process and treatment options available to the cardiac patient.   Exercise & Equipment Safety: - Individual verbal instruction and demonstration of equipment use and safety with use of the equipment.   Cardiac Rehab from 01/27/2019 in [facility] Cardiac and Pulmonary Rehab  Date  01/27/19  Educator  [facility]  Instruction Review Code  1- Verbalizes Understanding      Infection Prevention: - Provides verbal and written material to individual with discussion of infection control including proper hand washing and proper equipment cleaning during exercise session.   Cardiac Rehab from 01/27/2019 in [facility] Cardiac and Pulmonary Rehab  Date  01/27/19  Educator  [facility]  Instruction Review Code  1- Verbalizes Understanding      Falls Prevention: - Provides verbal and written material to individual with discussion of falls prevention and safety.   Cardiac Rehab from 01/27/2019 in [facility] Cardiac and Pulmonary Rehab  Date  01/27/19  Educator  [facility]  Instruction Review Code  1- Verbalizes Understanding      Diabetes: - Individual verbal and written instruction to review signs/symptoms of diabetes, desired ranges of glucose level fasting, after meals and with exercise. Acknowledge that pre and post exercise glucose checks will be done for 3 sessions at entry of program.   Cardiac Rehab from 01/27/2019 in [facility] Cardiac and Pulmonary Rehab  Date  01/23/19  Educator  [facility]  Instruction Review Code  1- Verbalizes Understanding      Know Your Numbers and Risk Factors: -Group verbal and written instruction about important numbers in your health.  Discussion of what are risk factors and how they play a role in the disease process.  Review of  Cholesterol, Blood Pressure, Diabetes, and BMI and the role they play in your overall health.   Sleep Hygiene: -Provides group verbal and written instruction about how sleep can affect your health.  Define sleep hygiene, discuss sleep cycles and impact of sleep habits. Review good sleep hygiene tips.    Other: -Provides group and verbal instruction on various topics (see comments)   Knowledge Questionnaire Score: Knowledge Questionnaire Score - 05/30/19 1041      Knowledge Questionnaire Score   Post Score  25/26       Core Components/Risk Factors/Patient Goals at Admission: Personal Goals and Risk Factors at Admission - 01/27/19 1618      Core Components/Risk Factors/Patient Goals on Admission    Weight Management  Yes;Obesity;Weight Loss    Intervention  Weight Management: Develop a combined nutrition and exercise program designed to reach desired caloric intake, while maintaining appropriate intake of nutrient and fiber, sodium and fats, and appropriate energy expenditure required for the weight goal.;Weight Management: Provide education and appropriate resources to help participant work on and attain dietary goals.;Weight Management/Obesity: Establish reasonable short term and long term weight goals.;Obesity: Provide education and appropriate resources to help participant work on and attain dietary goals.    Admit Weight  198 lb 6.4 oz (90 kg)    Goal Weight: Short Term  193 lb (87.5 kg)    Goal Weight: Long Term  188 lb (85.3 kg)    Expected Outcomes  Short Term: Continue to assess and modify interventions until short term weight is achieved;Long Term: Adherence to nutrition and physical activity/exercise program aimed toward attainment of established weight  goal;Weight Loss: Understanding of general recommendations for a balanced deficit meal plan, which promotes 1-2 lb weight loss per week and includes a negative energy balance of [phone] kcal/d;Understanding recommendations for  meals to include 15-35% energy as protein, 25-35% energy from fat, 35-60% energy from carbohydrates, less than 243m of dietary cholesterol, 20-35 gm of total fiber daily;Understanding of distribution of calorie intake throughout the day with the consumption of 4-5 meals/snacks    Diabetes  Yes    Intervention  Provide education about signs/symptoms and action to take for hypo/hyperglycemia.;Provide education about proper nutrition, including hydration, and aerobic/resistive exercise prescription along with prescribed medications to achieve blood glucose in normal ranges: Fasting glucose 65-99 mg/dL    Expected Outcomes  Short Term: Participant verbalizes understanding of the signs/symptoms and immediate care of hyper/hypoglycemia, proper foot care and importance of medication, aerobic/resistive exercise and nutrition plan for blood glucose control.;Long Term: Attainment of HbA1C < 7%.    Hypertension  Yes    Intervention  Provide education on lifestyle modifcations including regular physical activity/exercise, weight management, moderate sodium restriction and increased consumption of fresh fruit, vegetables, and low fat dairy, alcohol moderation, and smoking cessation.;Monitor prescription use compliance.    Expected Outcomes  Short Term: Continued assessment and intervention until BP is < 140/943mHG in hypertensive participants. < 130/8081mG in hypertensive participants with diabetes, heart failure or chronic kidney disease.;Long Term: Maintenance of blood pressure at goal levels.    Lipids  Yes    Intervention  Provide education and support for participant on nutrition & aerobic/resistive exercise along with prescribed medications to achieve LDL <42m86mDL >40mg63m Expected Outcomes  Short Term: Participant states understanding of desired cholesterol values and is compliant with medications prescribed. Participant is following exercise prescription and nutrition guidelines.;Long Term: Cholesterol  controlled with medications as prescribed, with individualized exercise RX and with personalized nutrition plan. Value goals: LDL < 42mg,58m > 40 mg.       Core Components/Risk Factors/Patient Goals Review:  Goals and Risk Factor Review    Row Name 02/14/19 1033 03/07/19 1032 04/11/19 1034 05/02/19 1034       Core Components/Risk Factors/Patient Goals Review   Personal Goals Review  Hypertension;Lipids;Improve shortness of breath with ADL's  Hypertension;Lipids;Weight Management/Obesity;Diabetes  Weight Management/Obesity;Lipids;Hypertension;Improve shortness of breath with ADL's  Weight Management/Obesity;Lipids;Hypertension;Improve shortness of breath with ADL's    Review  Brandi [name] meds as prescribed.  She recently started taking albuterol.  She reports not being as sore from exercise as in the first week.  Patient wants to lose some weight. She wants to lose 20 pounds in a healthy manner. She has tried weightBorgWarnersed her stationary bike back in 2012 and lost 40 pounds. She want to continue to be active and lose weight again.Her blood pressure has been stable and she checks her blood pressure at home everyday. Brandi [name] that she checks her sugar every morning.  Brandi [name] Amita to get her weight down. She is not sticking with a diet at the moment. Informed her to talk with our Dietician to get a hold on her diet and weight. Patient is focusing on her exercise and will try to lose a few pounds. Most of the time she can clean and cook without her getting short of breath.  Nysia's weight has been fairly steady.  She would still like to lose more.  Her pressures are still high but she does check them at home.  Her breathing  continues to improve.    Expected Outcomes  Short - continue to take meds as directed Long - manage risk factors  Short: maintain exercise routine to help with weight loss. Long: lose 20 pounds.  Short: Attend [facility] regularly to improve shortness of breath with  ADL's. Long: maintain independence with ADL's  Short: Attend HeartTrack regularly to improve shortness of breath with ADL's. Long: maintain independence with ADL's       Core Components/Risk Factors/Patient Goals at Discharge (Final Review):  Goals and Risk Factor Review - 05/02/19 1034      Core Components/Risk Factors/Patient Goals Review   Personal Goals Review  Weight Management/Obesity;Lipids;Hypertension;Improve shortness of breath with ADL's    Review  Lysbeth's weight has been fairly steady.  She would still like to lose more.  Her pressures are still high but she does check them at home.  Her breathing continues to improve.    Expected Outcomes  Short: Attend HeartTrack regularly to improve shortness of breath with ADL's. Long: maintain independence with ADL's       ITP Comments: ITP Comments    Row Name 01/23/19 1139 01/27/19 1547 01/29/19 0635 02/07/19 1018 02/20/19 1152   ITP Comments  Virtual Orientation with RN completed. Diagnosis can be found in [facility] 05/25/18. EP/RD orientation scheduled 8/10 at 2  Completed 6MWT, gym orientation, and RD evaluation. Initial ITP created and sent for review to Dr. Emily Filbert, Medical Director.  30 Day Review Completed today. Continue with ITP unless changed by Medical Director review.  New start to program  Weight up 2 pounds- some SOB noted by Brandi [name].  Advised call MD/ED if gets worse  Trevor has complaint of nausea and had some clear emesis today. After talking with her-this has been a recurrent problem. She was advised to call her PMD and review her persistent symptoms.   [place] Name 02/26/19 5361 03/11/19 1114 03/26/19 1332 04/22/19 1025 04/23/19 0706   ITP Comments  30 Day review. Continue with ITP unless directed changes per Medical Director review.  Davia has new anti nausea med  Aleece stated that she had some pulling pain in her chest on Sunday.  None today. She does see her physician this week to review the symptoms. Explained most likely post  surgical healing.  No weights today.  30 day review completed. ITP sent to Dr. Emily Filbert, Medical Director of Cardiac and Pulmonary Rehab. Continue with ITP unless changes are made by physician.  Department closed starting 10/2 until further notice by infection prevention and Health at Work teams for [place].  Arrived with high BP readings. Sat for 5-8 min and BP did come down.  Had "Thanksgiving dinner" with family yesterday. Has taken all AM meds.  30 day review completed. Continue with ITP sent to Dr. Emily Filbert, Medical Director of Cardiac and Pulmonary Rehab for review , changes as needed and signature.   [place] Name 05/20/19 1327 05/21/19 1053 06/10/19 0914       ITP Comments  Called to check on pt.  She is still coughing.  She hopes to return next week.  30 day review competed . ITP sent to Dr Emily Filbert for review, changes as needed and ITP approval signature.  Kalayna Noy called today.  Her daughter was exposed to COVID-19 and she was with her daughter yesterday.  She would like to go ahead and discharge today.        Comments: Discharge ITP

## 2019-06-10 NOTE — Progress Notes (Signed)
Discharge Progress Report  Patient Details  Name: Brandi [name] MRN: [id number] Date of Birth: [birth year] Referring Provider:     Cardiac Rehab from 01/27/2019 in [facility]  Referring Provider  Bartholome Bill MD       Number of Visits: 33  Reason for Discharge:  Patient reached a stable level of exercise. Patient independent in their exercise. Patient has met program and personal goals.  Smoking History:  Social History   Tobacco Use  Smoking Status Never Smoker  Smokeless Tobacco Never Used    Diagnosis:  S/P CABG x 3  ADL UCSD:   Initial Exercise Prescription: Initial Exercise Prescription - 01/27/19 1600      Date of Initial Exercise RX and Referring Provider   Date  01/27/19    Referring Provider  Bartholome Bill MD      Recumbant Bike   Level  1    RPM  50    Watts  5    Minutes  15    METs  1.1      NuStep   Level  1    SPM  80    Minutes  15    METs  1      Arm Ergometer   Level  1    RPM  25    Minutes  15    METs  1      Recumbant Elliptical   Level  1    RPM  50    Minutes  15    METs  1      REL-XR   Level  1    Speed  50    Minutes  15    METs  1.2      T5 Nustep   Level  1    SPM  80    Minutes  15    METs  1      Biostep-RELP   Level  1    SPM  50    Minutes  15    METs  1      Prescription Details   Frequency (times per week)  3    Duration  Progress to 30 minutes of continuous aerobic without signs/symptoms of physical distress      Intensity   THRR 40-80% of Max Heartrate  105-128    Ratings of Perceived Exertion  11-13    Perceived Dyspnea  0-4      Progression   Progression  Continue to progress workloads to maintain intensity without signs/symptoms of physical distress.      Resistance Training   Training Prescription  Yes    Weight  2 lbs    Reps  10-15       Discharge Exercise Prescription (Final Exercise Prescription Changes): Exercise Prescription Changes - 06/09/19 1600       Response to Exercise   Blood Pressure (Admit)  182/96    Blood Pressure (Exercise)  130/72    Blood Pressure (Exit)  138/76    Heart Rate (Admit)  78 bpm    Heart Rate (Exercise)  103 bpm    Heart Rate (Exit)  84 bpm    Rating of Perceived Exertion (Exercise)  13    Symptoms  none    Duration  Continue with 30 min of aerobic exercise without signs/symptoms of physical distress.    Intensity  THRR unchanged      Progression   Progression  Continue to progress workloads  to maintain intensity without signs/symptoms of physical distress.    Average METs  1.75      Resistance Training   Training Prescription  Yes    Weight  3 lb    Reps  10-15      Interval Training   Interval Training  No      NuStep   Level  3    Minutes  15    METs  1.6      Arm Ergometer   Level  1    Minutes  15    METs  1.7      T5 Nustep   Level  1    Minutes  15    METs  1.7      Biostep-RELP   Level  1    Minutes  15    METs  2      Home Exercise Plan   Plans to continue exercise at  Home (comment)   walking   Frequency  Add 2 additional days to program exercise sessions.    Initial Home Exercises Provided  02/14/19       Functional Capacity: 6 Minute Walk    Row Name 01/27/19 1612         6 Minute Walk   Phase  Initial     Distance  620 feet     Walk Time  5.5 minutes     # of Rest Breaks  0     MPH  1.28     METS  1.15     RPE  17     Perceived Dyspnea   2     VO2 Peak  4.02     Symptoms  Yes (comment)     Comments  winded, fatigue     Resting HR  81 bpm     Resting BP  142/74     Resting Oxygen Saturation   97 %     Exercise Oxygen Saturation  during 6 min walk  96 %     Max Ex. HR  134 bpm     Max Ex. BP  156/76     2 Minute Post BP  142/70        Psychological, QOL, Others - Outcomes: PHQ 2/9: Depression screen [facility] 2/9 05/30/2019 02/27/2019 01/27/2019  Decreased Interest 0 0 1  Down, Depressed, Hopeless 0 0 1  PHQ - 2 Score 0 0 2  Altered sleeping 1 0  2  Tired, decreased energy 1 1 1   Change in appetite 1 1 2   Feeling bad or failure about yourself  1 0 1  Trouble concentrating 0 0 0  Moving slowly or fidgety/restless 0 1 0  Suicidal thoughts 0 0 0  PHQ-9 Score 4 3 8   Difficult doing work/chores Not difficult at all Not difficult at all Somewhat difficult    Quality of Life: Quality of Life - 05/30/19 1041      Quality of Life Scores   Health/Function Pre  17.58 %    Health/Function Post  17.96 %    Health/Function % Change  2.16 %    Socioeconomic Pre  20.86 %    Socioeconomic Post  25.86 %    Socioeconomic % Change   23.97 %    Psych/Spiritual Pre  20 %    Psych/Spiritual Post  21.21 %    Psych/Spiritual % Change  6.05 %    Family Pre  23.63 %    Family Post  27 %    Family % Change  14.26 %    GLOBAL Pre  19.72 %    GLOBAL Post  21.53 %    GLOBAL % Change  9.18 %       Personal Goals: Goals established at orientation with interventions provided to work toward goal. Personal Goals and Risk Factors at Admission - 01/27/19 1618      Core Components/Risk Factors/Patient Goals on Admission    Weight Management  Yes;Obesity;Weight Loss    Intervention  Weight Management: Develop a combined nutrition and exercise program designed to reach desired caloric intake, while maintaining appropriate intake of nutrient and fiber, sodium and fats, and appropriate energy expenditure required for the weight goal.;Weight Management: Provide education and appropriate resources to help participant work on and attain dietary goals.;Weight Management/Obesity: Establish reasonable short term and long term weight goals.;Obesity: Provide education and appropriate resources to help participant work on and attain dietary goals.    Admit Weight  198 lb 6.4 oz (90 kg)    Goal Weight: Short Term  193 lb (87.5 kg)    Goal Weight: Long Term  188 lb (85.3 kg)    Expected Outcomes  Short Term: Continue to assess and modify interventions until short term  weight is achieved;Long Term: Adherence to nutrition and physical activity/exercise program aimed toward attainment of established weight goal;Weight Loss: Understanding of general recommendations for a balanced deficit meal plan, which promotes 1-2 lb weight loss per week and includes a negative energy balance of [phone] kcal/d;Understanding recommendations for meals to include 15-35% energy as protein, 25-35% energy from fat, 35-60% energy from carbohydrates, less than 217m of dietary cholesterol, 20-35 gm of total fiber daily;Understanding of distribution of calorie intake throughout the day with the consumption of 4-5 meals/snacks    Diabetes  Yes    Intervention  Provide education about signs/symptoms and action to take for hypo/hyperglycemia.;Provide education about proper nutrition, including hydration, and aerobic/resistive exercise prescription along with prescribed medications to achieve blood glucose in normal ranges: Fasting glucose 65-99 mg/dL    Expected Outcomes  Short Term: Participant verbalizes understanding of the signs/symptoms and immediate care of hyper/hypoglycemia, proper foot care and importance of medication, aerobic/resistive exercise and nutrition plan for blood glucose control.;Long Term: Attainment of HbA1C < 7%.    Hypertension  Yes    Intervention  Provide education on lifestyle modifcations including regular physical activity/exercise, weight management, moderate sodium restriction and increased consumption of fresh fruit, vegetables, and low fat dairy, alcohol moderation, and smoking cessation.;Monitor prescription use compliance.    Expected Outcomes  Short Term: Continued assessment and intervention until BP is < 140/919mHG in hypertensive participants. < 130/8038mG in hypertensive participants with diabetes, heart failure or chronic kidney disease.;Long Term: Maintenance of blood pressure at goal levels.    Lipids  Yes    Intervention  Provide education and support  for participant on nutrition & aerobic/resistive exercise along with prescribed medications to achieve LDL <61m39mDL >40mg32m Expected Outcomes  Short Term: Participant states understanding of desired cholesterol values and is compliant with medications prescribed. Participant is following exercise prescription and nutrition guidelines.;Long Term: Cholesterol controlled with medications as prescribed, with individualized exercise RX and with personalized nutrition plan. Value goals: LDL < 61mg,43m > 40 mg.        Personal Goals Discharge: Goals and Risk Factor Review    Row Name 02/14/19 1033 03/07/19 1032 04/11/19 1034 05/02/19 1034  Core Components/Risk Factors/Patient Goals Review   Personal Goals Review  Hypertension;Lipids;Improve shortness of breath with ADL's  Hypertension;Lipids;Weight Management/Obesity;Diabetes  Weight Management/Obesity;Lipids;Hypertension;Improve shortness of breath with ADL's  Weight Management/Obesity;Lipids;Hypertension;Improve shortness of breath with ADL's    Review  Jannie Doyle is taking meds as prescribed.  She recently started taking albuterol.  She reports not being as sore from exercise as in the first week.  Patient wants to lose some weight. She wants to lose 20 pounds in a healthy manner. She has tried [employer] and used her stationary bike back in 2012 and lost 40 pounds. She want to continue to be active and lose weight again.Her blood pressure has been stable and she checks her blood pressure at home everyday. Trudee states that she checks her sugar every morning.  Merryl wants to get her weight down. She is not sticking with a diet at the moment. Informed her to talk with our Dietician to get a hold on her diet and weight. Patient is focusing on her exercise and will try to lose a few pounds. Most of the time she can clean and cook without her getting short of breath.  Shalon's weight has been fairly steady.  She would still like to lose more.  Her  pressures are still high but she does check them at home.  Her breathing continues to improve.    Expected Outcomes  Short - continue to take meds as directed Long - manage risk factors  Short: maintain exercise routine to help with weight loss. Long: lose 20 pounds.  Short: Attend [facility] regularly to improve shortness of breath with ADL's. Long: maintain independence with ADL's  Short: Attend [facility] regularly to improve shortness of breath with ADL's. Long: maintain independence with ADL's       Exercise Goals and Review: Exercise Goals    Row Name 01/27/19 1617             Exercise Goals   Increase Physical Activity  Yes       Intervention  Provide advice, education, support and counseling about physical activity/exercise needs.;Develop an individualized exercise prescription for aerobic and resistive training based on initial evaluation findings, risk stratification, comorbidities and participant's personal goals.       Expected Outcomes  Short Term: Attend rehab on a regular basis to increase amount of physical activity.;Long Term: Add in home exercise to make exercise part of routine and to increase amount of physical activity.;Long Term: Exercising regularly at least 3-5 days a week.       Increase Strength and Stamina  Yes       Intervention  Provide advice, education, support and counseling about physical activity/exercise needs.;Develop an individualized exercise prescription for aerobic and resistive training based on initial evaluation findings, risk stratification, comorbidities and participant's personal goals.       Expected Outcomes  Short Term: Increase workloads from initial exercise prescription for resistance, speed, and METs.;Short Term: Perform resistance training exercises routinely during rehab and add in resistance training at home;Long Term: Improve cardiorespiratory fitness, muscular endurance and strength as measured by increased METs and functional capacity  (6MWT)       Able to understand and use rate of perceived exertion (RPE) scale  Yes       Intervention  Provide education and explanation on how to use RPE scale       Expected Outcomes  Long Term:  Able to use RPE to guide intensity level when exercising independently;Short Term: Able to use  RPE daily in rehab to express subjective intensity level       Able to understand and use Dyspnea scale  Yes       Intervention  Provide education and explanation on how to use Dyspnea scale       Expected Outcomes  Short Term: Able to use Dyspnea scale daily in rehab to express subjective sense of shortness of breath during exertion;Long Term: Able to use Dyspnea scale to guide intensity level when exercising independently       Knowledge and understanding of Target Heart Rate Range (THRR)  Yes       Intervention  Provide education and explanation of THRR including how the numbers were predicted and where they are located for reference       Expected Outcomes  Short Term: Able to state/look up THRR;Short Term: Able to use daily as guideline for intensity in rehab;Long Term: Able to use THRR to govern intensity when exercising independently       Able to check pulse independently  Yes       Intervention  Provide education and demonstration on how to check pulse in carotid and radial arteries.;Review the importance of being able to check your own pulse for safety during independent exercise       Expected Outcomes  Short Term: Able to explain why pulse checking is important during independent exercise;Long Term: Able to check pulse independently and accurately       Understanding of Exercise Prescription  Yes       Intervention  Provide education, explanation, and written materials on patient's individual exercise prescription       Expected Outcomes  Short Term: Able to explain program exercise prescription;Long Term: Able to explain home exercise prescription to exercise independently          Exercise Goals  Re-Evaluation: Exercise Goals Re-Evaluation    Row Name 01/28/19 1028 02/06/19 1541 02/14/19 1032 02/18/19 1253 03/04/19 1341     Exercise Goal Re-Evaluation   Exercise Goals Review  Increase Physical Activity;Increase Strength and Stamina;Able to understand and use rate of perceived exertion (RPE) scale;Able to understand and use Dyspnea scale;Knowledge and understanding of Target Heart Rate Range (THRR);Understanding of Exercise Prescription  Increase Physical Activity;Increase Strength and Stamina;Understanding of Exercise Prescription  Increase Physical Activity;Increase Strength and Stamina;Able to understand and use rate of perceived exertion (RPE) scale;Knowledge and understanding of Target Heart Rate Range (THRR);Able to check pulse independently  Increase Physical Activity;Increase Strength and Stamina;Able to understand and use rate of perceived exertion (RPE) scale;Knowledge and understanding of Target Heart Rate Range (THRR);Able to check pulse independently  --   Comments  Reviewed RPE scale, THR and program prescription with pt today.  Pt voiced understanding and was given a copy of goals to take home.  Tambria Pfannenstiel is off to a good start in rehab.  She getting in all 30 of her minutes much to her surprise.  We will continue to monitor her progress.  Joycelyn Liska has a stationary bike at home.  She plans to start by adding one day a week in addition to HT sessions.  Daziya Redmond is doing well in rehab.  She is now on level 2 on the NuStep.  We will continue to monitor her progress.  Toyna Erisman continues to do well in rehab.  She had an epsiode last week of nausea in class and was not able to go continuously like she had been doing.  She is doing well on  the NuStep.  We will continue to monitor her progress.   Expected Outcomes  Short: Use RPE daily to regulate intensity. Long: Follow program prescription in THR.  Short: Continue to attend class regulalry.  Long: Continue to follow program prescription.  Short  - add one exrecise session in addition to HT  Short: Continue to increase speed on the BioStep.  Long: Continue to add in exercise at home on off days.  Short: Continue to increase workloads.  Long: Continue to exercise at home.   [place] Name 03/19/19 1340 04/01/19 1414 04/11/19 1025 04/16/19 1455 04/30/19 1332     Exercise Goal Re-Evaluation   Exercise Goals Review  Increase Physical Activity;Increase Strength and Stamina;Able to understand and use rate of perceived exertion (RPE) scale;Knowledge and understanding of Target Heart Rate Range (THRR);Able to check pulse independently;Understanding of Exercise Prescription  Increase Physical Activity;Increase Strength and Stamina;Understanding of Exercise Prescription  Increase Physical Activity;Increase Strength and Stamina  Increase Physical Activity;Increase Strength and Stamina;Understanding of Exercise Prescription  Increase Physical Activity;Increase Strength and Stamina;Understanding of Exercise Prescription   Comments  Takila attends consistently.  She has had less nausea since starting a new med from her Dr.  Zenovia Jarred has been doing well in rehab.  She is up to 13 watts on the bike.  We will continue to monitor her progress.  When she is not at rehab she is out shopping and doing chores at home. She states that her neighborhood does not have sidewalks and the ground is uneven so she does not walk.  Esmeralda Malay is doing well in rehab.  She is holding steady with her METs and RPEs usually between 12-13.  We will continue to monitor her progress.  Michella Detjen continues to do well in rehab.  She is nearing graduation and will be doing her post 6MWT next week.  She likes the Nustep and XR.  We will continue to monitor her progress.   Expected Outcomes  Short - continue to attend consistently Long - maintain regular exercise routine  Short: Increase BioStep.  Long: Continue to improve stamina.  Short:come up with a home workout plan. Long: maintain exercise at home  independently.  Short: Continue to attend regularly  Long: Find home exercise routine.  Short: Improve post 6MWT.  Long: Continue to exercise independently.   [place] Name 05/02/19 1030 05/12/19 1338 06/09/19 1653         Exercise Goal Re-Evaluation   Exercise Goals Review  Increase Physical Activity;Increase Strength and Stamina;Understanding of Exercise Prescription  --  Increase Physical Activity;Increase Strength and Stamina;Understanding of Exercise Prescription     Comments  Desi Carby is doing well.  She is planning to use her recumbent bike at home for after graduation.  This last week she was decorating for Christmas.  Ariadne was out sick last week.  She has only attended once since last review.  Chellie Vanlue will graduate on Thursday.  She is going to continue to use our videos and walking to exercise at home.     Expected Outcomes  Short: Improve post 6MWT.  Long: Continue to exercise independently.  Short: Improve post 6MWT.  Long: Continue to exercise independently.  Short: Improve post 6MWT.  Long: Continue to exercise independently.        Nutrition & Weight - Outcomes: Pre Biometrics - 01/27/19 1617      Pre Biometrics   Height  5' 1"  (1.549 m)    Weight  198 lb 6.4 oz (90  kg)    BMI (Calculated)  37.51    Single Leg Stand  0.37 seconds        Nutrition: Nutrition Therapy & Goals - 01/27/19 1526      Nutrition Therapy   Diet  Low Na, HH, DM diet    Drug/Food Interactions  Statins/Certain Fruits   lipitor   Protein (specify units)  75g    Fiber  25 grams    Whole Grain Foods  3 servings    Saturated Fats  12 max. grams    Fruits and Vegetables  5 servings/day    Sodium  1.5 grams      Personal Nutrition Goals   Nutrition Goal  ST: increase vegetable intake (+1 serving 3x/week) LT: walk again and get rid of cane.    Comments  Lantus, janumet, glipizidesenokot, coregCa + D3 A1c 10/2018 was 6.7 (WNL). Pt reports for B she eats grits, eggos, special K or cinnamon cheerios with  whole milk. L tomato sandwich with full fat mayo and white bread. Dinner is chicken with the skin and/or bacon, baked pork chops and instant potatoes or white pasta. Pt will use margarine, high fat protein, whole fat dairy, fried foods at least 2x/week, minimal F and V. Pt suggested she needed to eat more vegetables, but would still like to review the content. Discussed HH DM eating.      Intervention Plan   Intervention  Prescribe, educate and counsel regarding individualized specific dietary modifications aiming towards targeted core components such as weight, hypertension, lipid management, diabetes, heart failure and other comorbidities.;Nutrition handout(s) given to patient.    Expected Outcomes  Short Term Goal: Understand basic principles of dietary content, such as calories, fat, sodium, cholesterol and nutrients.;Long Term Goal: Adherence to prescribed nutrition plan.;Short Term Goal: A plan has been developed with personal nutrition goals set during dietitian appointment.       Nutrition Discharge: Nutrition Assessments - 05/30/19 1044      MEDFICTS Scores   Post Score  43       Education Questionnaire Score: Knowledge Questionnaire Score - 05/30/19 1041      Knowledge Questionnaire Score   Post Score  25/26       Goals reviewed with patient; copy given to patient.

## 2020-03-08 ENCOUNTER — Other Ambulatory Visit: Payer: Self-pay | Admitting: Urology

## 2020-03-08 DIAGNOSIS — N3946 Mixed incontinence: Secondary | ICD-10-CM

## 2020-03-09 NOTE — Progress Notes (Deleted)
03/10/2020 12:52 PM   Brandi [name] 10/09/1935 [id number]  Referring provider: Baxter Hire, MD [place],  [place] [address]  No chief complaint on file.   HPI: 84 yo female with history of recurrent UTI's, incontinence, vaginal atrophy, cystocele and nocturia presents for follow up.    Recurrent UTI's Her risk factor for recurrent UTI's vaginal atrophy, incontinence and age.  She had one documented positive culture for pseudomonas in 02/2017.   She has not had an UTI in over two years.    Vaginal atrophy She is not using the cream.     Incontinence She is experiencing urgency x 4-7 (worsened), frequency x 8 or more (stable), incontinence x 4-7 (worsened), nocturia x 4-7 (worsened), she is not engaging in toilet mapping, she is not limiting her fluid intake to avoid trips to the restroom.  Her most bothersome symptoms are frequency, urgency and nocturia.  She is currently on Myrbetriq 25 mg daily.  Her BP is 147/81.  PVR is 2 mL.   She states she feels the Myrbetriq is helpful with controlling her urinary symptoms.    Nocturia Patient is using her CPAP machine.  She is having nocturia x 4-7 (worsened)    Cystocele Found incidentally on exam. Patient denies any vaginal bulge.  PMH: Past Medical History:  Diagnosis Date  . Anemia   . Anxiety   . Arthritis, degenerative   . Chronic cystitis   . Depression   . Diabetes mellitus without complication ([place])   . GERD (gastroesophageal reflux disease)   . Headache   . Heart disease   . HLD (hyperlipidemia)   . HTN (hypertension)   . Hypertension   . Incontinence in female   . Murmur   . Obesity   . Sleep apnea   . Urge incontinence of urine     Surgical History: Past Surgical History:  Procedure Laterality Date  . BREAST BIOPSY Right 2004   neg  . BUNIONECTOMY    . CARDIAC SURGERY     Stent  . CHOLECYSTECTOMY    . COLONOSCOPY WITH PROPOFOL N/A 06/14/2017   Procedure: COLONOSCOPY WITH  PROPOFOL;  Surgeon: Lollie Sails, MD;  Location[facility] ENDOSCOPY;  Service: Endoscopy;  Laterality: N/A;  . JOINT REPLACEMENT     TKR Bilateral  . LEFT HEART CATH AND CORONARY ANGIOGRAPHY N/A 05/22/2018   Procedure: LEFT HEART CATH AND CORONARY ANGIOGRAPHY;  Surgeon: Corey Skains, MD;  Location: [facility] CV LAB;  Service: Cardiovascular;  Laterality: N/A;  . LOOP RECORDER INSERTION N/A 09/20/2016   Procedure: Loop Recorder Insertion;  Surgeon: Isaias Cowman, MD;  Location: [place] [facility];  Service: Cardiovascular;  Laterality: N/A;  . SEPTOPLASTY      Home Medications:  Allergies as of 03/10/2020      Reactions   Micardis [telmisartan] Other (See Comments)   Dizziness   Accupril [quinapril Hcl] Rash   Quinapril Rash      Medication List       Accurate as of March 09, 2020 12:52 PM. If you have any questions, ask your nurse or doctor.        acetaminophen 500 MG tablet Commonly known as: TYLENOL Take by mouth.   albuterol 108 (90 Base) MCG/ACT inhaler Commonly known as: VENTOLIN HFA   Altamist Spray 0.65 % nasal spray Generic drug: sodium chloride Place into the nose.   aspirin EC 81 MG tablet Take 81 mg by mouth daily.  atorvastatin 40 MG tablet Commonly known as: LIPITOR Take by mouth.   budesonide-formoterol 80-4.5 MCG/ACT inhaler Commonly known as: SYMBICORT Inhale 2 puffs into the lungs 2 (two) times daily.   CALCIUM + D3 PO Take 1 tablet by mouth daily.   carvedilol 6.25 MG tablet Commonly known as: COREG Take 6.25 mg by mouth 2 (two) times daily with a meal.   clopidogrel 75 MG tablet Commonly known as: PLAVIX Take 75 mg by mouth daily.   conjugated estrogens vaginal cream Commonly known as: Premarin Place 1 Applicatorful vaginally daily. Apply 0.5mg  (pea-sized amount)  just inside the vaginal introitus with a finger-tip every night for two weeks and then Monday, Wednesday and Friday nights.   gabapentin 100 MG  capsule Commonly known as: NEURONTIN   glipiZIDE 5 MG tablet Commonly known as: GLUCOTROL Take 5 mg by mouth 2 (two) times daily.   hydrochlorothiazide 12.5 MG tablet Commonly known as: HYDRODIURIL   Janumet XR 50-1000 MG Tb24 Generic drug: SitaGLIPtin-MetFORMIN HCl Take 1 tablet by mouth 2 (two) times daily.   Janumet XR 50-1000 MG Tb24 Generic drug: SitaGLIPtin-MetFORMIN HCl Take 1 tablet by mouth 2 (two) times daily.   Lantus SoloStar 100 UNIT/ML Solostar Pen Generic drug: insulin glargine Inject 12 Units into the skin at bedtime.   losartan 100 MG tablet Commonly known as: COZAAR Take 100 mg by mouth daily.   meloxicam 15 MG tablet Commonly known as: MOBIC Take 15 mg by mouth daily.   mirabegron ER 25 MG Tb24 tablet Commonly known as: Myrbetriq Take 1 tablet (25 mg total) by mouth daily.   pantoprazole 40 MG tablet Commonly known as: PROTONIX Take 40 mg by mouth daily.   prasugrel 10 MG Tabs tablet Commonly known as: EFFIENT Take 10 mg by mouth daily.   pregabalin 75 MG capsule Commonly known as: LYRICA Take 75 mg by mouth 2 (two) times daily.   PreserVision/Lutein Caps Take 1 capsule by mouth 2 (two) times daily.   Refresh Tears 0.5 % Soln Generic drug: carboxymethylcellulose Apply 1-2 drops to eye at bedtime.   senna-docusate 8.6-50 MG tablet Commonly known as: Senokot-S Take 1 tablet by mouth at bedtime as needed for mild constipation.   simvastatin 20 MG tablet Commonly known as: ZOCOR Take 20 mg by mouth daily at 6 PM.   traZODone 50 MG tablet Commonly known as: DESYREL   venlafaxine 37.5 MG tablet Commonly known as: EFFEXOR   Verapamil HCl CR 300 MG Cp24 Take 1 capsule by mouth daily.   VITAMIN B-12 PO Take 1 tablet by mouth daily.       Allergies:  Allergies  Allergen Reactions  . Micardis [Telmisartan] Other (See Comments)    Dizziness  . Accupril [Quinapril Hcl] Rash  . Quinapril Rash    Family History: Family History   Problem Relation Age of Onset  . Breast cancer Cousin   . Skin cancer Other   . Diabetes Other   . Hypercholesterolemia Other   . Hypertension Other   . Bladder Cancer Maternal Aunt   . COPD Other   . Kidney disease Neg Hx   . Kidney cancer Neg Hx     Social History:  reports that she has never smoked. She has never used smokeless tobacco. She reports that she does not drink alcohol and does not use drugs.  ROS: For pertinent review of systems please refer to history of present illness  Physical Exam: There were no vitals taken for this visit.  Constitutional:  Well nourished. Alert and oriented, No acute distress. HEENT: [place] AT, moist mucus membranes.  Trachea midline, no masses. Cardiovascular: No clubbing, cyanosis, or edema. Respiratory: Normal respiratory effort, no increased work of breathing. GI: Abdomen is soft, non tender, non distended, no abdominal masses. Liver and spleen not palpable.  No hernias appreciated.  Stool sample for occult testing is not indicated.   GU: No CVA tenderness.  No bladder fullness or masses.  *** external genitalia, *** pubic hair distribution, no lesions.  Normal urethral meatus, no lesions, no prolapse, no discharge.   No urethral masses, tenderness and/or tenderness. No bladder fullness, tenderness or masses. *** vagina mucosa, *** estrogen effect, no discharge, no lesions, *** pelvic support, *** cystocele and *** rectocele noted.  No cervical motion tenderness.  Uterus is freely mobile and non-fixed.  No adnexal/parametria masses or tenderness noted.  Anus and perineum are without rashes or lesions.   ***  Skin: No rashes, bruises or suspicious lesions. Lymph: No cervical or inguinal adenopathy. Neurologic: Grossly intact, no focal deficits, moving all 4 extremities. Psychiatric: Normal mood and affect.   Laboratory Data: Lab Results  Component Value Date   WBC 6.3 05/24/2018   HGB 11.4 (L) 05/24/2018   HCT 35.3 (L) 05/24/2018   MCV 80.2  05/24/2018   PLT 213 05/24/2018   Lab Results  Component Value Date   CREATININE 1.12 (H) 05/24/2018   Lab Results  Component Value Date   HGBA1C 7.3 (H) 04/16/2016      Component Value Date/Time   CHOL 141 06/30/2011 0551   HDL 63 (H) 06/30/2011 0551   VLDL 14 06/30/2011 0551   LDLCALC 64 06/30/2011 0551   Lab Results  Component Value Date   AST 25 04/14/2016   Lab Results  Component Value Date   ALT 17 04/14/2016   I have reviewed the labs.  Pertinent Imaging ***   Assessment & Plan:    1. History of recurrent UTI's Asymptomatic at this visit Asked to contact office for symptoms of UTI's   2. Vaginal atrophy Not applying the vaginal estrogen cream   3. Incontinence Continue the Myrbetriq 25 mg daily - refills given RTC in one year for OAB questionnaire and PVR  4. Cystocele:   Continue to manage conservatively at this time.  We will reassess when she returns in one year.      No follow-ups on file.  These notes generated with voice recognition software. I apologize for typographical errors.  Zara Council, PA-C  [facility] [address] [place], [place] [address] [phone]

## 2020-03-10 ENCOUNTER — Ambulatory Visit: Payer: Medicare Other | Admitting: Urology

## 2020-03-12 ENCOUNTER — Encounter: Payer: Self-pay | Admitting: Urology

## 2020-03-13 ENCOUNTER — Other Ambulatory Visit: Payer: Self-pay | Admitting: Urology

## 2020-03-13 DIAGNOSIS — N3946 Mixed incontinence: Secondary | ICD-10-CM

## 2020-03-15 ENCOUNTER — Telehealth: Payer: Self-pay | Admitting: Urology

## 2020-03-15 NOTE — Telephone Encounter (Signed)
She did not show for her yearly follow up last week, so we need to get her reschedule for that and we can talk about other options at that appointment.

## 2020-03-15 NOTE — Telephone Encounter (Signed)
Patient called and stated that her myrbetriq  Is not covered by her insurance and wants to know if there is anything else she can try? She said it worked really good for her.   [name]

## 2020-03-16 NOTE — Telephone Encounter (Signed)
Patient notified and voiced understanding. Appointment made. 

## 2020-03-18 NOTE — Progress Notes (Signed)
03/19/2020 12:28 PM   Brandi [name] 31-Jan-1936 [id number]  Referring provider: Baxter Hire, MD [name],  [name] [address]  Chief Complaint  Patient presents with  . Urinary Incontinence    HPI: 84 yo female with history of recurrent UTI's, incontinence, vaginal atrophy, cystocele and nocturia presents for follow up.    Recurrent UTI's Her risk factor for recurrent UTI's vaginal atrophy, incontinence and age.  She had a positive urine culture for pansensitive E. coli in September 2021, but she was not having symptoms at the time.  Her UA looked abnormal during a routine follow-up.  Vaginal atrophy She is not using the cream.     Incontinence She is experiencing urgency x 4-7 (stable), frequency x 4-7 (improved), incontinence x 0-3 (improved), nocturia x 0-3 (improved), she is not engaging in toilet mapping, she is not limiting her fluid intake to avoid trips to the restroom, incontinence x 0-3,  Her BP is 106/73.  PVR is 46 mL.   She states she feels the Myrbetriq is helpful with controlling her urinary symptoms, but [facility] called her and said her insurance was not going to cover her Myrbetriq anymore.    Nocturia Patient is using her CPAP machine.  She is having nocturia x 0-3 (improved)     PMH: Past Medical History:  Diagnosis Date  . Anemia   . Anxiety   . Arthritis, degenerative   . Chronic cystitis   . Depression   . Diabetes mellitus without complication ([place])   . GERD (gastroesophageal reflux disease)   . Headache   . Heart disease   . HLD (hyperlipidemia)   . HTN (hypertension)   . Hypertension   . Incontinence in female   . Murmur   . Obesity   . Sleep apnea   . Urge incontinence of urine     Surgical History: Past Surgical History:  Procedure Laterality Date  . BREAST BIOPSY Right 2004   neg  . BUNIONECTOMY    . CARDIAC SURGERY     Stent  . CHOLECYSTECTOMY    . COLONOSCOPY WITH PROPOFOL N/A 06/14/2017   Procedure:  COLONOSCOPY WITH PROPOFOL;  Surgeon: Lollie Sails, MD;  Location: [facility] ENDOSCOPY;  Service: Endoscopy;  Laterality: N/A;  . JOINT REPLACEMENT     TKR Bilateral  . LEFT HEART CATH AND CORONARY ANGIOGRAPHY N/A 05/22/2018   Procedure: LEFT HEART CATH AND CORONARY ANGIOGRAPHY;  Surgeon: Corey Skains, MD;  Location: [facility];  Service: Cardiovascular;  Laterality: N/A;  . LOOP RECORDER INSERTION N/A 09/20/2016   Procedure: Loop Recorder Insertion;  Surgeon: Isaias Cowman, MD;  Location: [facility];  Service: Cardiovascular;  Laterality: N/A;  . SEPTOPLASTY      Home Medications:  Allergies as of 03/19/2020      Reactions   Micardis [telmisartan] Other (See Comments)   Dizziness   Accupril [quinapril Hcl] Rash   Quinapril Rash      Medication List       Accurate as of March 19, 2020 11:59 PM. If you have any questions, ask your nurse or doctor.        STOP taking these medications   mirabegron ER 25 MG Tb24 tablet Commonly known as: Myrbetriq Stopped by: Zara Council, PA-C     TAKE these medications   acetaminophen 500 MG tablet Commonly known as: TYLENOL Take by mouth.   albuterol 108 (90 Base) MCG/ACT inhaler Commonly known as: VENTOLIN HFA  Altamist Spray 0.65 % nasal spray Generic drug: sodium chloride Place into the nose.   aspirin EC 81 MG tablet Take 81 mg by mouth daily.   atorvastatin 40 MG tablet Commonly known as: LIPITOR Take by mouth.   budesonide-formoterol 80-4.5 MCG/ACT inhaler Commonly known as: SYMBICORT Inhale 2 puffs into the lungs 2 (two) times daily.   CALCIUM + D3 PO Take 1 tablet by mouth daily.   carvedilol 6.25 MG tablet Commonly known as: COREG Take 6.25 mg by mouth 2 (two) times daily with a meal.   clopidogrel 75 MG tablet Commonly known as: PLAVIX Take 75 mg by mouth daily.   conjugated estrogens vaginal cream Commonly known as: Premarin Place 1 Applicatorful vaginally daily. Apply  0.5mg  (pea-sized amount)  just inside the vaginal introitus with a finger-tip every night for two weeks and then Monday, Wednesday and Friday nights.   gabapentin 100 MG capsule Commonly known as: NEURONTIN   Gemtesa 75 MG Tabs Generic drug: Vibegron Take 75 mg by mouth daily. Started by: Zara Council, PA-C   glipiZIDE 5 MG tablet Commonly known as: GLUCOTROL Take 5 mg by mouth 2 (two) times daily.   hydrochlorothiazide 12.5 MG tablet Commonly known as: HYDRODIURIL   Janumet XR 50-1000 MG Tb24 Generic drug: SitaGLIPtin-MetFORMIN HCl Take 1 tablet by mouth 2 (two) times daily.   Janumet XR 50-1000 MG Tb24 Generic drug: SitaGLIPtin-MetFORMIN HCl Take 1 tablet by mouth 2 (two) times daily.   Lantus SoloStar 100 UNIT/ML Solostar Pen Generic drug: insulin glargine Inject 12 Units into the skin at bedtime.   losartan 100 MG tablet Commonly known as: COZAAR Take 100 mg by mouth daily.   meloxicam 15 MG tablet Commonly known as: MOBIC Take 15 mg by mouth daily.   pantoprazole 40 MG tablet Commonly known as: PROTONIX Take 40 mg by mouth daily.   prasugrel 10 MG Tabs tablet Commonly known as: EFFIENT Take 10 mg by mouth daily.   pregabalin 75 MG capsule Commonly known as: LYRICA Take 75 mg by mouth 2 (two) times daily.   PreserVision/Lutein Caps Take 1 capsule by mouth 2 (two) times daily.   Refresh Tears 0.5 % Soln Generic drug: carboxymethylcellulose Apply 1-2 drops to eye at bedtime.   senna-docusate 8.6-50 MG tablet Commonly known as: Senokot-S Take 1 tablet by mouth at bedtime as needed for mild constipation.   simvastatin 20 MG tablet Commonly known as: ZOCOR Take 20 mg by mouth daily at 6 PM.   traZODone 50 MG tablet Commonly known as: DESYREL   venlafaxine 37.5 MG tablet Commonly known as: EFFEXOR   Verapamil HCl CR 300 MG Cp24 Take 1 capsule by mouth daily.   VITAMIN B-12 PO Take 1 tablet by mouth daily.       Allergies:  Allergies    Allergen Reactions  . Micardis [Telmisartan] Other (See Comments)    Dizziness  . Accupril [Quinapril Hcl] Rash  . Quinapril Rash    Family History: Family History  Problem Relation Age of Onset  . Breast cancer Cousin   . Skin cancer Other   . Diabetes Other   . Hypercholesterolemia Other   . Hypertension Other   . Bladder Cancer Maternal Aunt   . COPD Other   . Kidney disease Neg Hx   . Kidney cancer Neg Hx     Social History:  reports that she has never smoked. She has never used smokeless tobacco. She reports that she does not drink alcohol and does not  use drugs.  ROS: For pertinent review of systems please refer to history of present illness  Physical Exam: BP 106/73   Pulse 83   Ht 5\' 1"  (1.549 m)   Wt 192 lb (87.1 kg)   BMI 36.28 kg/m   Constitutional:  Well nourished. Alert and oriented, No acute distress. HEENT: [place] AT, mask in place.  Trachea midline Cardiovascular: No clubbing, cyanosis, or edema. Respiratory: Normal respiratory effort, no increased work of breathing. Neurologic: Grossly intact, no focal deficits, moving all 4 extremities. Psychiatric: Normal mood and affect.   Laboratory Data: Lab Results  Component Value Date   WBC 6.3 05/24/2018   HGB 11.4 (L) 05/24/2018   HCT 35.3 (L) 05/24/2018   MCV 80.2 05/24/2018   PLT 213 05/24/2018   Lab Results  Component Value Date   CREATININE 1.12 (H) 05/24/2018   Lab Results  Component Value Date   HGBA1C 7.3 (H) 04/16/2016      Component Value Date/Time   CHOL 141 06/30/2011 0551   HDL 63 (H) 06/30/2011 0551   VLDL 14 06/30/2011 0551   LDLCALC 64 06/30/2011 0551   Lab Results  Component Value Date   AST 25 04/14/2016   Lab Results  Component Value Date   ALT 17 04/14/2016   Specimen:  Urine  Ref Range & Units 13 d ago  Color Yellow  Yellow      Clarity Clear  SL CloudyAbnormal      Specific Gravity 1.000 - 1.030  <=1.005      pH, Urine 5.0 - 8.0  7.0      Protein,  Urinalysis Negative, Trace mg/dL Negative      Glucose, Urinalysis Negative mg/dL Negative      Ketones, Urinalysis Negative mg/dL Negative      Blood, Urinalysis Negative  Negative      Nitrite, Urinalysis Negative  PositiveAbnormal      Leukocyte Esterase, Urinalysis Negative  LargeAbnormal      White Blood Cells, Urinalysis None Seen, 0-3 /hpf 10-50Abnormal      Red Blood Cells, Urinalysis None Seen, 0-3 /hpf 0-3      Bacteria, Urinalysis None Seen /hpf ManyAbnormal      Squamous Epithelial Cells, Urinalysis Rare, Few, None Seen /hpf Rare      Resulting Agency  [facility]  Specimen Collected: 03/10/20 11:54 AM Last Resulted: 03/10/20 1:42 PM  Received From: [facility]  Result Received: 03/12/20 2:13 PM    I have reviewed the labs.  Pertinent Imaging Results for ILONA, COLLEY (MRN [id number]) as of 03/19/2020 11:23  Ref. Range 03/19/2020 11:14  Scan Result Unknown 46     Assessment & Plan:    1. History of recurrent UTI's Asymptomatic at this visit Asked to contact office for symptoms of UTI's   2. Vaginal atrophy Not applying the vaginal estrogen cream   3. Incontinence Not certain why her pharmacy told her that Myrbetriq will no longer be covered under her insurance as epic indicates that it is on the formulary and preferred I have given her samples of Gemtesa 75 mg daily, #28 as these are also on her formulary to see if they are as effective as the Myrbetriq She will return in 3 weeks for OAB questionnaire and PVR    Return in about 3 weeks (around 04/09/2020) for PVR and OAB questionnaire.  These notes generated with voice recognition software. I apologize for typographical errors.  Zara Council, PA-C  [facility] [place], [place] [address] [phone]

## 2020-03-19 ENCOUNTER — Encounter: Payer: Self-pay | Admitting: Urology

## 2020-03-19 ENCOUNTER — Other Ambulatory Visit: Payer: Self-pay

## 2020-03-19 ENCOUNTER — Ambulatory Visit (INDEPENDENT_AMBULATORY_CARE_PROVIDER_SITE_OTHER): Payer: Medicare PPO | Admitting: Urology

## 2020-03-19 VITALS — BP 106/73 | HR 83 | Ht 61.0 in | Wt 192.0 lb

## 2020-03-19 DIAGNOSIS — N3946 Mixed incontinence: Secondary | ICD-10-CM | POA: Diagnosis not present

## 2020-03-19 LAB — BLADDER SCAN AMB NON-IMAGING: Scan Result: 46

## 2020-03-19 MED ORDER — GEMTESA 75 MG PO TABS: 75.0000 mg | ORAL_TABLET | Freq: Every day | ORAL | 0 refills | Status: DC

## 2020-03-28 ENCOUNTER — Encounter: Payer: Self-pay | Admitting: Radiology

## 2020-03-28 ENCOUNTER — Observation Stay: Payer: Medicare PPO

## 2020-03-28 ENCOUNTER — Emergency Department: Payer: Medicare PPO

## 2020-03-28 ENCOUNTER — Observation Stay
Admission: EM | Admit: 2020-03-28 | Discharge: 2020-03-29 | Disposition: A | Discharge status: Home / Self Care | Payer: Medicare PPO | Attending: Internal Medicine | Admitting: Internal Medicine

## 2020-03-28 DIAGNOSIS — E119 Type 2 diabetes mellitus without complications: Secondary | ICD-10-CM

## 2020-03-28 DIAGNOSIS — R42 Dizziness and giddiness: Secondary | ICD-10-CM | POA: Diagnosis present

## 2020-03-28 DIAGNOSIS — G459 Transient cerebral ischemic attack, unspecified: Secondary | ICD-10-CM | POA: Diagnosis not present

## 2020-03-28 DIAGNOSIS — Z20822 Contact with and (suspected) exposure to covid-19: Secondary | ICD-10-CM | POA: Insufficient documentation

## 2020-03-28 DIAGNOSIS — J449 Chronic obstructive pulmonary disease, unspecified: Secondary | ICD-10-CM | POA: Insufficient documentation

## 2020-03-28 DIAGNOSIS — Z794 Long term (current) use of insulin: Secondary | ICD-10-CM | POA: Insufficient documentation

## 2020-03-28 DIAGNOSIS — R1032 Left lower quadrant pain: Secondary | ICD-10-CM

## 2020-03-28 DIAGNOSIS — I25110 Atherosclerotic heart disease of native coronary artery with unstable angina pectoris: Secondary | ICD-10-CM | POA: Insufficient documentation

## 2020-03-28 DIAGNOSIS — I2510 Atherosclerotic heart disease of native coronary artery without angina pectoris: Secondary | ICD-10-CM | POA: Diagnosis not present

## 2020-03-28 DIAGNOSIS — R55 Syncope and collapse: Principal | ICD-10-CM | POA: Diagnosis present

## 2020-03-28 DIAGNOSIS — Z79899 Other long term (current) drug therapy: Secondary | ICD-10-CM | POA: Diagnosis not present

## 2020-03-28 DIAGNOSIS — Z7982 Long term (current) use of aspirin: Secondary | ICD-10-CM | POA: Insufficient documentation

## 2020-03-28 DIAGNOSIS — I10 Essential (primary) hypertension: Secondary | ICD-10-CM | POA: Diagnosis not present

## 2020-03-28 LAB — URINALYSIS, COMPLETE (UACMP) WITH MICROSCOPIC
Bacteria, UA: NONE SEEN
Bilirubin Urine: NEGATIVE
Glucose, UA: NEGATIVE mg/dL
Hgb urine dipstick: NEGATIVE
Ketones, ur: NEGATIVE mg/dL
Nitrite: POSITIVE — AB
Protein, ur: NEGATIVE mg/dL
Specific Gravity, Urine: 1.019 (ref 1.005–1.030)
Squamous Epithelial / HPF: NONE SEEN (ref 0–5)
pH: 6 (ref 5.0–8.0)

## 2020-03-28 LAB — RESPIRATORY PANEL BY RT PCR (FLU A&B, COVID)
Influenza A by PCR: NEGATIVE
Influenza B by PCR: NEGATIVE
SARS Coronavirus 2 by RT PCR: NEGATIVE

## 2020-03-28 LAB — HEPATIC FUNCTION PANEL
ALT: 20 U/L (ref 0–44)
AST: 22 U/L (ref 15–41)
Albumin: 3.5 g/dL (ref 3.5–5.0)
Alkaline Phosphatase: 56 U/L (ref 38–126)
Bilirubin, Direct: <0.1 mg/dL (ref 0.0–0.2)
Total Bilirubin: 0.6 mg/dL (ref 0.3–1.2)
Total Protein: 6.5 g/dL (ref 6.5–8.1)

## 2020-03-28 LAB — BASIC METABOLIC PANEL
Anion gap: 13 (ref 5–15)
BUN: 13 mg/dL (ref 8–23)
CO2: 26 mmol/L (ref 22–32)
Calcium: 9.1 mg/dL (ref 8.9–10.3)
Chloride: 94 mmol/L — ABNORMAL LOW (ref 98–111)
Creatinine, Ser: 1.16 mg/dL — ABNORMAL HIGH (ref 0.44–1.00)
GFR, Estimated: 44 mL/min — ABNORMAL LOW (ref >60)
Glucose, Bld: 177 mg/dL — ABNORMAL HIGH (ref 70–99)
Potassium: 3.5 mmol/L (ref 3.5–5.1)
Sodium: 133 mmol/L — ABNORMAL LOW (ref 135–145)

## 2020-03-28 LAB — GLUCOSE, CAPILLARY
Glucose-Capillary: 79 mg/dL (ref 70–99)
Glucose-Capillary: 63 mg/dL — ABNORMAL LOW (ref 70–99)
Glucose-Capillary: 89 mg/dL (ref 70–99)

## 2020-03-28 LAB — CBC
HCT: 37 % (ref 36.0–46.0)
Hemoglobin: 12 g/dL (ref 12.0–15.0)
MCH: 26.3 pg (ref 26.0–34.0)
MCHC: 32.4 g/dL (ref 30.0–36.0)
MCV: 81.1 fL (ref 80.0–100.0)
Platelets: 227 10*3/uL (ref 150–400)
RBC: 4.56 MIL/uL (ref 3.87–5.11)
RDW: 14.3 % (ref 11.5–15.5)
WBC: 7.5 10*3/uL (ref 4.0–10.5)
nRBC: 0 % (ref 0.0–0.2)

## 2020-03-28 LAB — HEMOGLOBIN A1C
Hgb A1c MFr Bld: 6.3 % — ABNORMAL HIGH (ref 4.8–5.6)
Mean Plasma Glucose: 134.11 mg/dL

## 2020-03-28 LAB — LIPASE, BLOOD: Lipase: 18 U/L (ref 11–51)

## 2020-03-28 LAB — TROPONIN I (HIGH SENSITIVITY)
Troponin I (High Sensitivity): 6 ng/L (ref <18)
Troponin I (High Sensitivity): 5 ng/L (ref <18)

## 2020-03-28 MED ORDER — INSULIN ASPART 100 UNIT/ML ~~LOC~~ SOLN: 0.0000 [IU] | Freq: Three times a day (TID) | SUBCUTANEOUS | Status: DC

## 2020-03-28 MED ORDER — ATORVASTATIN CALCIUM 20 MG PO TABS
40.0000 mg | ORAL_TABLET | Freq: Every day | ORAL | Status: DC
  Administered 2020-03-29: 40 mg via ORAL
  Filled 2020-03-28: qty 2

## 2020-03-28 MED ORDER — IOHEXOL 300 MG/ML  SOLN
80.0000 mL | Freq: Once | INTRAMUSCULAR | Status: AC | PRN
  Administered 2020-03-28: 80 mL via INTRAVENOUS

## 2020-03-28 MED ORDER — ACETAMINOPHEN 325 MG PO TABS: 650.0000 mg | ORAL_TABLET | Freq: Three times a day (TID) | ORAL | Status: DC | PRN

## 2020-03-28 MED ORDER — ASPIRIN EC 81 MG PO TBEC
81.0000 mg | DELAYED_RELEASE_TABLET | Freq: Every day | ORAL | Status: DC
  Administered 2020-03-28 – 2020-03-29 (×2): 81 mg via ORAL
  Filled 2020-03-28: qty 1

## 2020-03-28 MED ORDER — TRAZODONE HCL 50 MG PO TABS
50.0000 mg | ORAL_TABLET | Freq: Every evening | ORAL | Status: DC | PRN
  Administered 2020-03-29: 50 mg via ORAL
  Filled 2020-03-28: qty 1

## 2020-03-28 MED ORDER — ONDANSETRON HCL 4 MG/2ML IJ SOLN: 4.0000 mg | Freq: Four times a day (QID) | INTRAMUSCULAR | Status: DC | PRN

## 2020-03-28 MED ORDER — SODIUM CHLORIDE 0.9% FLUSH: 3.0000 mL | Freq: Two times a day (BID) | INTRAVENOUS | Status: DC

## 2020-03-28 MED ORDER — ESTROGENS, CONJUGATED 0.625 MG/GM VA CREA: 1.0000 | TOPICAL_CREAM | Freq: Every day | VAGINAL | Status: DC

## 2020-03-28 MED ORDER — PRASUGREL HCL 10 MG PO TABS: 10.0000 mg | ORAL_TABLET | Freq: Every day | ORAL | Status: DC

## 2020-03-28 MED ORDER — VIBEGRON 75 MG PO TABS: 75.0000 mg | ORAL_TABLET | Freq: Every day | ORAL | Status: DC

## 2020-03-28 MED ORDER — ALBUTEROL SULFATE HFA 108 (90 BASE) MCG/ACT IN AERS
2.0000 | INHALATION_SPRAY | Freq: Four times a day (QID) | RESPIRATORY_TRACT | Status: DC | PRN
  Filled 2020-03-28: qty 6.7

## 2020-03-28 MED ORDER — CALCIUM CARBONATE-VITAMIN D 500-200 MG-UNIT PO TABS
1.0000 | ORAL_TABLET | Freq: Every day | ORAL | Status: DC
  Administered 2020-03-29: 1 via ORAL
  Filled 2020-03-28: qty 1

## 2020-03-28 MED ORDER — ACETAMINOPHEN 650 MG RE SUPP: 650.0000 mg | Freq: Four times a day (QID) | RECTAL | Status: DC | PRN

## 2020-03-28 MED ORDER — ENOXAPARIN SODIUM 40 MG/0.4ML ~~LOC~~ SOLN
40.0000 mg | SUBCUTANEOUS | Status: DC
  Administered 2020-03-28: 40 mg via SUBCUTANEOUS
  Filled 2020-03-28: qty 0.4

## 2020-03-28 MED ORDER — ASPIRIN 81 MG PO CHEW: 324.0000 mg | CHEWABLE_TABLET | Freq: Once | ORAL | Status: DC

## 2020-03-28 MED ORDER — SODIUM CHLORIDE 0.9 % IV SOLN: INTRAVENOUS | Status: DC

## 2020-03-28 MED ORDER — HYDROCHLOROTHIAZIDE 25 MG PO TABS
12.5000 mg | ORAL_TABLET | Freq: Every day | ORAL | Status: DC
  Administered 2020-03-29: 12.5 mg via ORAL
  Filled 2020-03-28: qty 1

## 2020-03-28 MED ORDER — SODIUM CHLORIDE 0.9 % IV BOLUS: 500.0000 mL | Freq: Once | INTRAVENOUS | Status: AC

## 2020-03-28 MED ORDER — POLYVINYL ALCOHOL 1.4 % OP SOLN
1.0000 [drp] | Freq: Every day | OPHTHALMIC | Status: DC
  Administered 2020-03-28: 1 [drp] via OPHTHALMIC
  Filled 2020-03-28 (×2): qty 15

## 2020-03-28 MED ORDER — SALINE SPRAY 0.65 % NA SOLN
2.0000 | NASAL | Status: DC | PRN
  Filled 2020-03-28: qty 44

## 2020-03-28 MED ORDER — SENNOSIDES-DOCUSATE SODIUM 8.6-50 MG PO TABS: 1.0000 | ORAL_TABLET | Freq: Every evening | ORAL | Status: DC | PRN

## 2020-03-28 MED ORDER — MOMETASONE FURO-FORMOTEROL FUM 100-5 MCG/ACT IN AERO
2.0000 | INHALATION_SPRAY | Freq: Two times a day (BID) | RESPIRATORY_TRACT | Status: DC
  Administered 2020-03-28 – 2020-03-29 (×2): 2 via RESPIRATORY_TRACT
  Filled 2020-03-28: qty 8.8

## 2020-03-28 MED ORDER — CLOPIDOGREL BISULFATE 75 MG PO TABS: 75.0000 mg | ORAL_TABLET | Freq: Every day | ORAL | Status: DC

## 2020-03-28 MED ORDER — VERAPAMIL HCL ER 300 MG PO CP24: 1.0000 | ORAL_CAPSULE | Freq: Every day | ORAL | Status: DC

## 2020-03-28 MED ORDER — LABETALOL HCL 5 MG/ML IV SOLN: 10.0000 mg | Freq: Once | INTRAVENOUS | Status: AC

## 2020-03-28 MED ORDER — PREGABALIN 75 MG PO CAPS
75.0000 mg | ORAL_CAPSULE | Freq: Two times a day (BID) | ORAL | Status: DC
  Administered 2020-03-28 – 2020-03-29 (×2): 75 mg via ORAL
  Filled 2020-03-28 (×2): qty 1

## 2020-03-28 MED ORDER — VENLAFAXINE HCL 37.5 MG PO TABS: 37.5000 mg | ORAL_TABLET | Freq: Every day | ORAL | Status: DC

## 2020-03-28 MED ORDER — ACETAMINOPHEN 325 MG PO TABS: 650.0000 mg | ORAL_TABLET | Freq: Four times a day (QID) | ORAL | Status: DC | PRN

## 2020-03-28 MED ORDER — OCUVITE-LUTEIN PO CAPS
1.0000 | ORAL_CAPSULE | Freq: Two times a day (BID) | ORAL | Status: DC
  Administered 2020-03-28 – 2020-03-29 (×2): 1 via ORAL
  Filled 2020-03-28 (×4): qty 1

## 2020-03-28 MED ORDER — PANTOPRAZOLE SODIUM 40 MG PO TBEC
40.0000 mg | DELAYED_RELEASE_TABLET | Freq: Every day | ORAL | Status: DC
  Administered 2020-03-28 – 2020-03-29 (×2): 40 mg via ORAL
  Filled 2020-03-28 (×2): qty 1

## 2020-03-28 MED ORDER — VENLAFAXINE HCL 37.5 MG PO TABS
37.5000 mg | ORAL_TABLET | Freq: Two times a day (BID) | ORAL | Status: DC
  Administered 2020-03-28 – 2020-03-29 (×2): 37.5 mg via ORAL
  Filled 2020-03-28 (×3): qty 1

## 2020-03-28 MED ORDER — ONDANSETRON HCL 4 MG PO TABS: 4.0000 mg | ORAL_TABLET | Freq: Four times a day (QID) | ORAL | Status: DC | PRN

## 2020-03-28 MED ORDER — HYDRALAZINE HCL 20 MG/ML IJ SOLN
10.0000 mg | Freq: Four times a day (QID) | INTRAMUSCULAR | Status: DC | PRN
  Administered 2020-03-28: 10 mg via INTRAVENOUS
  Filled 2020-03-28: qty 1

## 2020-03-28 MED ORDER — LOSARTAN POTASSIUM 50 MG PO TABS
100.0000 mg | ORAL_TABLET | Freq: Every day | ORAL | Status: DC
  Administered 2020-03-29: 100 mg via ORAL
  Filled 2020-03-28: qty 2

## 2020-03-28 MED ORDER — CARVEDILOL 6.25 MG PO TABS
6.2500 mg | ORAL_TABLET | Freq: Two times a day (BID) | ORAL | Status: DC
  Administered 2020-03-28 – 2020-03-29 (×2): 6.25 mg via ORAL
  Filled 2020-03-28 (×2): qty 1

## 2020-03-28 NOTE — ED Notes (Signed)
Pr provided with apple juice and crackers, due to blood sugar.

## 2020-03-28 NOTE — ED Provider Notes (Signed)
[facility] Emergency Department Provider Note   ____________________________________________   First MD Initiated Contact with Patient 03/28/20 1151     (approximate)  I have reviewed the triage vital signs and the nursing notes.   HISTORY  Chief Complaint Loss of Consciousness    HPI Brandi [name] is a 84 y.o. female here for evaluation after passing out  Patient was seated at church when she was witnessed to pass out, she passed out for about 4 to 5 minutes.  She does report that she felt a little lightheaded or just not quite herself this morning, and that since Thursday she had about 4-5 loose bowel movements and is still having a little bit of left lower abdominal pain.  Previous history of diverticulitis  No fevers or chills.  No vomiting.  Slight decreased intake and had diarrhea but just on Thursday that she does not think was bloody but also did not look  No chest pain or trouble breathing.  Has not passed out like this before  EMS reports he noticed some very slight decreased sensation over her right side compared to the left when doing assessment for possible strokelike symptoms.   Past Medical History:  Diagnosis Date  . Anemia   . Anxiety   . Arthritis, degenerative   . Chronic cystitis   . Depression   . Diabetes mellitus without complication ([place])   . GERD (gastroesophageal reflux disease)   . Headache   . Heart disease   . HLD (hyperlipidemia)   . HTN (hypertension)   . Hypertension   . Incontinence in female   . Murmur   . Obesity   . Sleep apnea   . Urge incontinence of urine     Patient Active Problem List   Diagnosis Date Noted  . Unstable angina ([place]) 05/21/2018  . Chest pain 05/20/2018  . Positional lightheadedness 03/14/2017  . Diabetes mellitus type 2, insulin dependent ([place]) 03/14/2017  . Action tremor 09/11/2016  . Headache disorder 09/11/2016  . Severe sepsis ([place]) 04/14/2016  . Orthostatic hypotension  11/10/2015  . Diabetic neuropathy ([place]) 10/06/2015  . Recurrent UTI 09/13/2015  . Cystocele, grade 3 09/13/2015  . Incontinence 09/13/2015  . Depression, major, in remission ([place]) 12/23/2014  . Pernicious anemia 12/23/2014  . Coronary arteriosclerosis 01/06/2014  . Sleep apnea in adult 01/06/2014  . Benign essential hypertension 12/01/2013  . Obesity, unspecified 12/01/2013  . Acquired cyst of kidney 05/02/2013  . Chronic cystitis 03/11/2012    Past Surgical History:  Procedure Laterality Date  . BREAST BIOPSY Right 2004   neg  . BUNIONECTOMY    . CARDIAC SURGERY     Stent  . CHOLECYSTECTOMY    . COLONOSCOPY WITH PROPOFOL N/A 06/14/2017   Procedure: COLONOSCOPY WITH PROPOFOL;  Surgeon: Lollie Sails, MD;  Location: [facility] ENDOSCOPY;  Service: Endoscopy;  Laterality: N/A;  . JOINT REPLACEMENT     TKR Bilateral  . LEFT HEART CATH AND CORONARY ANGIOGRAPHY N/A 05/22/2018   Procedure: LEFT HEART CATH AND CORONARY ANGIOGRAPHY;  Surgeon: Corey Skains, MD;  Location: [place] CV [facility];  Service: Cardiovascular;  Laterality: N/A;  . LOOP RECORDER INSERTION N/A 09/20/2016   Procedure: Loop Recorder Insertion;  Surgeon: Isaias Cowman, MD;  Location: [place] [facility];  Service: Cardiovascular;  Laterality: N/A;  . SEPTOPLASTY      Prior to Admission medications   Medication Sig Start Date End Date Taking? Authorizing Provider  acetaminophen (TYLENOL) 500 MG tablet  Take by mouth.    [provider]  albuterol (VENTOLIN HFA) 108 (90 Base) MCG/ACT inhaler  12/27/18   [provider]  aspirin EC 81 MG tablet Take 81 mg by mouth daily.    [provider]  atorvastatin (LIPITOR) 40 MG tablet Take by mouth. 08/06/18   [provider]  budesonide-formoterol (SYMBICORT) 80-4.5 MCG/ACT inhaler Inhale 2 puffs into the lungs 2 (two) times daily.    [provider]  Calcium Carb-Cholecalciferol (CALCIUM + D3 PO) Take 1 tablet by mouth  daily.    [provider]  carboxymethylcellulose (REFRESH TEARS) 0.5 % SOLN Apply 1-2 drops to eye at bedtime.     [provider]  carvedilol (COREG) 6.25 MG tablet Take 6.25 mg by mouth 2 (two) times daily with a meal.     [provider]  clopidogrel (PLAVIX) 75 MG tablet Take 75 mg by mouth daily.    [provider]  conjugated estrogens (PREMARIN) vaginal cream Place 1 Applicatorful vaginally daily. Apply 0.5mg  (pea-sized amount)  just inside the vaginal introitus with a finger-tip every night for two weeks and then Monday, Wednesday and Friday nights. 05/04/16   Zara Council A, PA-C  Cyanocobalamin (VITAMIN B-12 PO) Take 1 tablet by mouth daily.    [provider]  gabapentin (NEURONTIN) 100 MG capsule  12/27/18   [provider]  glipiZIDE (GLUCOTROL) 5 MG tablet Take 5 mg by mouth 2 (two) times daily.  08/19/13   [provider]  hydrochlorothiazide (HYDRODIURIL) 12.5 MG tablet  12/02/17   [provider]  Insulin Glargine (LANTUS SOLOSTAR) 100 UNIT/ML Solostar Pen Inject 12 Units into the skin at bedtime.  07/20/15   [provider]  JANUMET XR 50-1000 MG TB24 Take 1 tablet by mouth 2 (two) times daily. 10/02/18   [provider]  losartan (COZAAR) 100 MG tablet Take 100 mg by mouth daily.    [provider]  meloxicam (MOBIC) 15 MG tablet Take 15 mg by mouth daily.    [provider]  Multiple Vitamins-Minerals (PRESERVISION/LUTEIN) CAPS Take 1 capsule by mouth 2 (two) times daily.  03/12/12   [provider]  pantoprazole (PROTONIX) 40 MG tablet Take 40 mg by mouth daily.    [provider]  prasugrel (EFFIENT) 10 MG TABS tablet Take 10 mg by mouth daily.    [provider]  pregabalin (LYRICA) 75 MG capsule Take 75 mg by mouth 2 (two) times daily.    [provider]  senna-docusate (SENOKOT-S) 8.6-50 MG tablet Take 1 tablet by mouth at bedtime as  needed for mild constipation. 05/24/18   [name], MD  simvastatin (ZOCOR) 20 MG tablet Take 20 mg by mouth daily at 6 PM.     [provider]  SitaGLIPtin-MetFORMIN HCl (JANUMET XR) 50-1000 MG TB24 Take 1 tablet by mouth 2 (two) times daily. 12/02/14   [provider]  sodium chloride (ALTAMIST SPRAY) 0.65 % nasal spray Place into the nose.    [provider]  traZODone (DESYREL) 50 MG tablet  12/27/18   [provider]  venlafaxine [facility]) 37.5 MG tablet  12/27/18   [provider]  Verapamil HCl CR 300 MG CP24 Take 1 capsule by mouth daily.    [provider]  Vibegron (GEMTESA) 75 MG TABS Take 75 mg by mouth daily. 03/19/20   Zara Council A, PA-C    Allergies Micardis [telmisartan], Accupril [quinapril hcl], and Quinapril  Family History  Problem Relation Age of Onset  . Breast cancer Cousin   . Skin cancer Other   . Diabetes Other   . Hypercholesterolemia Other   . Hypertension Other   . Bladder Cancer Maternal Aunt   . COPD Other   . Kidney disease Neg Hx   . Kidney cancer Neg Hx     Social History Social History   Tobacco Use  . Smoking status: Never Smoker  . Smokeless tobacco: Never Used  Substance Use Topics  . Alcohol use: No  . Drug use: No    Review of Systems Constitutional: No fever/chills Eyes: No visual changes. ENT: No sore throat.  No neck pain. Cardiovascular: Denies chest pain. Respiratory: Denies shortness of breath. Gastrointestinal: See HPI some mild left lower abdominal pain for a few days Genitourinary: Negative for dysuria. Musculoskeletal: Negative for back pain. Skin: Negative for rash. Neurological: Negative for areas of focal weakness or numbness.  Minor headache across the front    ____________________________________________   PHYSICAL EXAM:  VITAL SIGNS: ED Triage Vitals  Enc Vitals Group     BP 03/28/20 1153 (!) 152/69     Pulse Rate 03/28/20 1153 65      Resp 03/28/20 1153 20     Temp 03/28/20 1153 98.1 F (36.7 C)     Temp Source 03/28/20 1153 Oral     SpO2 03/28/20 1153 97 %     Weight 03/28/20 1154 190 lb (86.2 kg)     Height 03/28/20 1154 5\' 1"  (1.549 m)     Head Circumference --      Peak Flow --      Pain Score 03/28/20 1154 0     Pain Loc --      Pain Edu? --      Excl. in [place]? --     Constitutional: Alert and oriented. Well appearing and in no acute distress. Eyes: Conjunctivae are normal. Head: Atraumatic. Nose: No congestion/rhinnorhea. Mouth/Throat: Mucous membranes are moist. Neck: No stridor.  Cardiovascular: Normal rate, regular rhythm. Grossly normal heart sounds.  Good peripheral circulation. Respiratory: Normal respiratory effort.  No retractions. Lungs CTAB. Gastrointestinal: Soft and nontender. No distention. Musculoskeletal: No lower extremity tenderness nor edema. Neurologic:  Normal speech and language. No gross focal neurologic deficits are appreciated.  NIHSS 1, mild R leg drift (very minor) Skin:  Skin is warm, dry and intact. No rash noted. Psychiatric: Mood and affect are normal. Speech and behavior are normal.  No TPA per neuro MD, symptoms of R leg weak and R sided decreased sensation resolved now  ____________________________________________   LABS (all labs ordered are listed, but only abnormal results are displayed)  Labs Reviewed  BASIC METABOLIC PANEL - Abnormal; Notable for the following components:      Result Value   Sodium 133 (*)    Chloride 94 (*)    Glucose, Bld 177 (*)    Creatinine, Ser 1.16 (*)    GFR, Estimated 44 (*)    All other components within normal limits  RESPIRATORY PANEL BY RT PCR (FLU A&B, COVID)  CBC  HEPATIC FUNCTION PANEL  LIPASE, BLOOD  URINALYSIS, COMPLETE (UACMP) WITH MICROSCOPIC  CBG MONITORING, ED   ____________________________________________  EKG Reviewed and interpreted by me at 1155 Heart rate 65 QRS 100 QTc 460 Normal sinus rhythm, slight  artifact, no noted acute ischemic abnormalities  ____________________________________________  RADIOLOGY  CT Head Wo Contrast  Result Date: 03/28/2020 CLINICAL DATA:  Syncope EXAM: CT HEAD WITHOUT CONTRAST TECHNIQUE:  Contiguous axial images were obtained from the base of the skull through the vertex without intravenous contrast. COMPARISON:  October 01, 2015 FINDINGS: Brain: No evidence of acute infarction, hemorrhage, hydrocephalus, extra-axial collection or mass lesion/mass effect. Periventricular white matter hypodensities consistent with sequela of chronic microvascular ischemic disease. Vascular: No hyperdense vessel or unexpected calcification. Skull: Normal. Negative for fracture or focal lesion. Sinuses/Orbits: No acute finding. Other: None. IMPRESSION: No acute intracranial abnormality. Electronically Signed   By: Valentino Saxon MD   On: 03/28/2020 13:39   CT ABDOMEN PELVIS W CONTRAST  Result Date: 03/28/2020 CLINICAL DATA:  Epigastric pain EXAM: CT ABDOMEN AND PELVIS WITH CONTRAST TECHNIQUE: Multidetector CT imaging of the abdomen and pelvis was performed using the standard protocol following bolus administration of intravenous contrast. CONTRAST:  40mL OMNIPAQUE IOHEXOL 300 MG/ML  SOLN COMPARISON:  February 18, 2011. April 14, 2016 FINDINGS: Lower chest: RIGHT lower lobe pulmonary nodule measures 2 mm, unchanged since 2017 and thereby benign (series 2, image 7). Mild centrilobular nodularity at the RIGHT lung base. Three-vessel coronary artery atherosclerotic calcifications. Loop recorder. Atherosclerotic calcifications of the thoracic aorta. Hepatobiliary: Status post cholecystectomy. There is mild central biliary ductal dilation. Common bile duct measures 9 mm. Enhancing 7 mm mass of the LEFT liver (series 2, image 26). Pancreas: Atrophic pancreas.  No peripancreatic fat stranding. Spleen: Normal in size without focal abnormality.  Splenule. Adrenals/Urinary Tract: Adrenals are  unremarkable. RIGHT-sided renal cysts. No hydronephrosis. Kidneys enhance symmetrically. Bladder is decompressed. Stomach/Bowel: Small hiatal hernia. The esophagus is partially fluid-filled. High density material within the stomach likely reflecting enteric debris. No evidence of bowel obstruction. There is a likely internal mesenteric defect within the RIGHT mid abdomen given swirling appearance of the mesentery at this location (series 5, image 30). It contains cecum and redundant sigmoid colon. The cecum is located in the RIGHT upper quadrant. Appendix is normal. There is a large colonic stool burden throughout the RIGHT colon and transverse colon Vascular/Lymphatic: Severe atherosclerotic calcifications. No suspicious lymphadenopathy. Reproductive: Uterus and bilateral adnexa are unremarkable. Other: Nonobstructive small bowel containing LEFT inguinal hernia. Musculoskeletal: Remote RIGHT pelvic fracture. IMPRESSION: 1. Large colonic stool burden throughout the RIGHT colon and transverse colon. 2. There is a likely internal nonobstructed mesenteric defect within the RIGHT mid abdomen given swirling appearance of the mesentery at this location. It contains stool filled RIGHT colon and redundant sigmoid colon. No evidence of bowel obstruction. This may be contributing to patient constipation. 3. Nonobstructive small bowel containing LEFT inguinal hernia. 4. Enhancing 7 mm mass of the LEFT liver. In the absence of known malignancy, this likely represents a flash filling hemangioma. This could be further evaluated with dedicated three-phase CT or hepatic MRI if clinically indicated. 5. Mild central biliary ductal dilation, likely related to prior cholecystectomy. Correlate with liver function tests. 6. Mild centrilobular nodularity at the RIGHT lung base which is favored to reflect sequela of aspiration given fluid within the esophagus and small hiatal hernia. Aortic Atherosclerosis (ICD10-I70.0). Electronically  Signed   By: Valentino Saxon MD   On: 03/28/2020 14:04    CT of the head reviewed nothing acute.  Will provide aspirin at this point given concern for possible TIA-like symptoms ____________________________________________   PROCEDURES  Procedure(s) performed: None  Procedures  Critical Care performed: Yes, see critical care note(s) CRITICAL CARE Performed by: Delman Kitten   Total critical care time: 30 minutes  Critical care time was exclusive of separately billable procedures and treating other patients.  Critical care  was necessary to treat or prevent imminent or life-threatening deterioration.  Critical care was time spent personally by me on the following activities: development of treatment plan with patient and/or surrogate as well as nursing, discussions with consultants, evaluation of patient's response to treatment, examination of patient, obtaining history from patient or surrogate, ordering and performing treatments and interventions, ordering and review of laboratory studies, ordering and review of radiographic studies, pulse oximetry and re-evaluation of patient's condition.  Acute note of right lower extremity weakness, possibly indicative of acute stroke or central neurologic process.  ____________________________________________   INITIAL IMPRESSION / ASSESSMENT AND PLAN / ED COURSE  Pertinent labs & imaging results that were available during my care of the patient were reviewed by me and considered in my medical decision making (see chart for details).   Patient presents with a few items, 1 she began having left lower abdominal pain and some loose stools on Thursday that seem to be improving but still having some discomfort.  She also has a history of diverticulitis.  We will proceed with reviewing labs as well as CT imaging to further evaluate for etiology.  Additionally feeling lightheaded this morning and had a syncopal episode, this was also accompanied by  brief but now resolved feeling of slight weakness in the right leg noted by mild pronator drift in that leg and also noted as EMS is having slight decreased sensation over the right which had resolved by the time she arrived to the ER  Her symptoms indicate potentially concerning etiology especially in the setting of syncope, dehydration, possible stroke, cardiac, pulmonary, abdominal etiologies all considered.  CT the head performed.  Stat neurology consult performed, advising no TPA symptoms improved on their assessment.    ----------------------------------------- 2:22 PM on 03/28/2020 -----------------------------------------  Patient resting comfortably at this time.  No distress.  Discussed findings of head CT and also discussed her abdominal CT with her.  Discussed with Dr. Francine Graven of the hospitalist service.  Will admit for further care and work-up, neurology has not yet been able to provide complete recommendations but will as they were called away to a priority patient.  At this point, will provide aspirin, and further care and work-up under the hospitalist service.  Multiple and somewhat interesting findings on her CT scan of the abdomen and pelvis, including some that appear to be incidental.  When I told the patient about this, she told me that she was informed by eye doctor on a previous colonoscopy that she had extra segment of colon possibly.  Nonetheless, will be observed, does not have evidence of obstruction at this time.  ____________________________________________   FINAL CLINICAL IMPRESSION(S) / ED DIAGNOSES  Final diagnoses:  Syncope and collapse  TIA (transient ischemic attack)  Left lower quadrant abdominal pain        Note:  This document was prepared using Dragon voice recognition software and may include unintentional dictation errors       Delman Kitten, MD 03/28/20 1425

## 2020-03-28 NOTE — ED Notes (Addendum)
Spoke with pharmacy and they are verifying medications  Per pharmacy premarin cream not due until tomorrow  Pharmacy will be sending effexor

## 2020-03-28 NOTE — ED Triage Notes (Signed)
Patient to ED via EMS for a syncopal event while at church. Patient was noted to be extremely diaphoretic during the event. Witnesses state she was "out" for 4-5 mins. Patient arrives to the ED alert and able to answer questions. No deficits noted bilaterally but noted before church she felt funny in the head. States now she feels lightheaded. FS with EMS was 207. Other VS WNL. Denies CP or shortness of breath.

## 2020-03-28 NOTE — Consult Note (Addendum)
Neurology Consultation Reason for Consult: Right leg weakness and transient numbness Referring Physician: Delman Kitten   CC: Loss of consciousness   History is obtained from: Patient and chart review  HPI: Brandi [name] is a 84 y.o. female with a past medical history significant for hypertension, hyperlipidemia, diabetes, obesity, sleep apnea, anxiety, coronary artery disease status post stent, loop recorder insertion, presenting with an acute loss of consciousness.  She reports that she was sitting next to her son in a pew at church when she suddenly felt very anxious and felt like she needed to grab onto him.  She then lost awareness, but was told by him and bystanders that he got her safely to the ground, and that she was very diaphoretic.  When she came to she was on the ground and another church member was applying cool paper towels to her face.  She did not have any significant postevent confusion.  There was no incontinence or tongue biting.  She has had a similar episode years ago.  She does not have any history of seizures.  When being evaluated by EMS on route to the hospital, she was having some difficulty lifting her right leg and some slight loss of sensation on that side.  The symptoms rapidly resolved.   On review of systems she reported some abdominal pain on Thursday which has resolved, associated with some loose stools which is also resolved.  She additionally reports that she had 3 falls in August, 1 was clearly mechanical when she slipped in the shower, but the other two are more nebulous (falling while vacuuming because she reached down to get something, and falling backwards while she was trying to make her bed).  One of her sons now helps her with vacuuming and making the bed.  She otherwise denies headaches, changes in her vision, hearing changes, fevers, chills, sweats, new incontinence, other focal weakness or numbness recently.   LKW:  tPA given?: No, due to symptoms  resolved Premorbid modified rankin scale:      1 - No significant disability. Able to carry out all usual activities, despite some symptoms.   Past Medical History:  Diagnosis Date  . Anemia   . Anxiety   . Arthritis, degenerative   . Chronic cystitis   . Depression   . Diabetes mellitus without complication ([place])   . GERD (gastroesophageal reflux disease)   . Headache   . Heart disease   . HLD (hyperlipidemia)   . HTN (hypertension)   . Hypertension   . Incontinence in female   . Murmur   . Obesity   . Sleep apnea   . Urge incontinence of urine    Past Surgical History:  Procedure Laterality Date  . BREAST BIOPSY Right 2004   neg  . BUNIONECTOMY    . CARDIAC SURGERY     Stent  . CHOLECYSTECTOMY    . COLONOSCOPY WITH PROPOFOL N/A 06/14/2017   Procedure: COLONOSCOPY WITH PROPOFOL;  Surgeon: Lollie Sails, MD;  Location: [facility] ENDOSCOPY;  Service: Endoscopy;  Laterality: N/A;  . JOINT REPLACEMENT     TKR Bilateral  . LEFT HEART CATH AND CORONARY ANGIOGRAPHY N/A 05/22/2018   Procedure: LEFT HEART CATH AND CORONARY ANGIOGRAPHY;  Surgeon: Corey Skains, MD;  Location: [place] CV LAB;  Service: Cardiovascular;  Laterality: N/A;  . LOOP RECORDER INSERTION N/A 09/20/2016   Procedure: Loop Recorder Insertion;  Surgeon: Isaias Cowman, MD;  Location: [place] CV LAB;  Service: Cardiovascular;  Laterality: N/A;  . SEPTOPLASTY     Current Outpatient Medications  Medication Instructions  . acetaminophen (TYLENOL) 500 MG tablet Oral  . albuterol (VENTOLIN HFA) 108 (90 Base) MCG/ACT inhaler No dose, route, or frequency recorded.  Marland Kitchen aspirin EC 81 mg, Oral, Daily  . atorvastatin (LIPITOR) 40 MG tablet Oral  . budesonide-formoterol (SYMBICORT) 80-4.5 MCG/ACT inhaler 2 puffs, Inhalation, 2 times daily  . Calcium Carb-Cholecalciferol (CALCIUM + D3 PO) 1 tablet, Oral, Daily  . carboxymethylcellulose (REFRESH TEARS) 0.5 % SOLN 1-2 drops, Ophthalmic, Daily at bedtime   . carvedilol (COREG) 6.25 mg, Oral, 2 times daily with meals  . clopidogrel (PLAVIX) 75 mg, Oral, Daily  . conjugated estrogens (PREMARIN) vaginal cream 1 Applicatorful, Vaginal, Daily, Apply 0.5mg  (pea-sized amount)  just inside the vaginal introitus with a finger-tip every night for two weeks and then Monday, Wednesday and Friday nights.  . Cyanocobalamin (VITAMIN B-12 PO) 1 tablet, Oral, Daily  . gabapentin (NEURONTIN) 100 MG capsule No dose, route, or frequency recorded.  Logan Bores 75 mg, Oral, Daily  . glipiZIDE (GLUCOTROL) 5 mg, Oral, 2 times daily  . hydrochlorothiazide (HYDRODIURIL) 12.5 MG tablet No dose, route, or frequency recorded.  Marland Kitchen JANUMET XR 50-1000 MG TB24 1 tablet, Oral, 2 times daily  . Lantus SoloStar 12 Units, Subcutaneous, Daily at bedtime  . losartan (COZAAR) 100 mg, Oral, Daily  . meloxicam (MOBIC) 15 mg, Oral, Daily  . Multiple Vitamins-Minerals (PRESERVISION/LUTEIN) CAPS 1 capsule, Oral, 2 times daily  . pantoprazole (PROTONIX) 40 mg, Oral, Daily  . prasugrel (EFFIENT) 10 mg, Oral, Daily  . pregabalin (LYRICA) 75 mg, Oral, 2 times daily  . senna-docusate (SENOKOT-S) 8.6-50 MG tablet 1 tablet, Oral, At bedtime PRN  . simvastatin (ZOCOR) 20 mg, Oral, Daily-1800  . SitaGLIPtin-MetFORMIN HCl (JANUMET XR) 50-1000 MG TB24 1 tablet, Oral, 2 times daily  . sodium chloride (ALTAMIST SPRAY) 0.65 % nasal spray Nasal  . traZODone (DESYREL) 50 MG tablet No dose, route, or frequency recorded.  . venlafaxine (EFFEXOR) 37.5 MG tablet No dose, route, or frequency recorded.  . Verapamil HCl CR 300 MG CP24 1 capsule, Oral, Daily     Family History  Problem Relation Age of Onset  . Breast cancer Cousin   . Skin cancer Other   . Diabetes Other   . Hypercholesterolemia Other   . Hypertension Other   . Bladder Cancer Maternal Aunt   . COPD Other   . Kidney disease Neg Hx   . Kidney cancer Neg Hx     Social History:  reports that she has never smoked. She has never used  smokeless tobacco. She reports that she does not drink alcohol and does not use drugs.  Exam: Current vital signs: BP (!) 152/69 (BP Location: Right Arm)   Pulse 65   Temp 98.1 F (36.7 C) (Oral)   Resp 20   Ht 5\' 1"  (1.549 m)   Wt 86.2 kg   SpO2 97%   BMI 35.90 kg/m  Vital signs in last 24 hours: Temp:  [98.1 F (36.7 C)] 98.1 F (36.7 C) (10/10 1153) Pulse Rate:  [65] 65 (10/10 1153) Resp:  [20] 20 (10/10 1153) BP: (152)/(69) 152/69 (10/10 1153) SpO2:  [97 %] 97 % (10/10 1153) Weight:  [86.2 kg] 86.2 kg (10/10 1154)   Physical Exam  Constitutional: Appears well-developed and well-nourished.  Psych: Affect appropriate to situation Eyes: No scleral injection HENT: No OP obstruction MSK: no joint deformities.  Cardiovascular: Normal rate and regular rhythm.  Respiratory: Effort normal, non-labored breathing GI: Soft.  No distension. There is no tenderness.  Skin: WDI  Neuro: Mental Status: Patient is awake, alert, oriented to person, place, month, year, and situation. Patient is able to give a clear and coherent history. No signs of aphasia or neglect Cranial Nerves: II: Visual Fields are full. Pupils are equal, round, and reactive to light.   III,IV, VI: EOMI without ptosis or diploplia.  V: Facial sensation is symmetric to temperature VII: Facial movement is symmetric.  VIII: hearing is intact to voice X: Uvula elevates symmetrically XI: Shoulder shrug is symmetric. XII: tongue is midline without atrophy or fasciculations.  Motor: Tone is normal. Bulk is normal. 5/5 strength was present in all four extremities, mild right hip flexor weakness 4+/5 which patient feels is her baseline  Sensory: Sensation is symmetric to light touch and temperature in the arms and legs. Deep Tendon Reflexes: 2+ and symmetric in the biceps and patellae.  Plantars: Toes are downgoing bilaterally.  Cerebellar: FNF and HKS are intact bilaterally Gait She is able to rise on her  toes and heels, and tandem gait is at her baseline per patient (fairly steady)  I have reviewed labs in epic and the results pertinent to this consultation are: Cr 1.16 (baseline), CrCl 37.3, GFR ~40  Sodium 133, Cl 94 CBC WNL A1c 6.7% on 03/10/2020 LDL 54, HDL 53, Triglycerides 151, total cholesterol 138 on 03/10/2020  ECHO 02/19/2019   EF of 55%, mild concentric LVH, and mildly enlarged left atrium  I have reviewed the images obtained: CT head w/o acute process MRI brain w/o acute process (moderate chronic microvascular changes)    Impression: Overall, the episode of loss of consciousness is described suggestive of vasovagal syncope.  The post-event right leg weakness may be recrudescence of an old lesion, and she does have microvascular changes.  However a posterior circulation TIA cannot be excluded. The patient is already on dual antiplatelet therapy at home (thus vessel imaging would not change recommendations) and there is a plan for cardiac monitoring per cardiology consult.  She has had a recent A1c and lipid panel as documented above. Her most recent ECHO I am able to locate in the electronic medical record is from 02/19/2019 (over a year ago), and is notable for an EF of 55%, mild concentric LVH, and mildly enlarged left atrium.  Given the length of time since her last echo and the fact that she has had a recent event, it is reasonable to repeat this study as part of a TIA work-up.   Recommendations: - ECHO cardiogram to rule out intracardiac thrombus - Neurologically, would favor anticoagulation alone without DAPT if intracardiac thrombus was found, but will defer to cardiology recommendations in this situation - Risk factor modification: Normotension blood pressure goal, LDL is meeting goal of less than 70, A1c also < 7.0%, continue to encourage diet, exercise and CPAP compliance - Outpatient neurology follow-up - Neurology will sign off at this time, please reach out if questions  arise  Lesleigh Noe MD-PhD [facility] [phone]

## 2020-03-28 NOTE — ED Notes (Signed)
Pt one person assist to the bedside toilet without difficulty to void

## 2020-03-28 NOTE — ED Notes (Signed)
Provider messaged and notified of BP of 195/63

## 2020-03-28 NOTE — ED Notes (Signed)
Hand hygiene performed and pt eating supper

## 2020-03-28 NOTE — ED Notes (Signed)
Provider notified of BP of 200/81. Cardiac, bp and pulse ox monitor placed on pt.

## 2020-03-28 NOTE — Consult Note (Signed)
[facility]  CARDIOLOGY CONSULT NOTE  Patient ID: KYNNADI DICENSO MRN: [id number] DOB/AGE: February 16, 1936 84 y.o.  Admit date: 03/28/2020 Referring Physician [place] Primary Physician [facility] Primary Cardiologist F[place] Reason for Consultation syncope  HPI: 84 year old female with known history of coronary artery disease, essential hypertension, type 2 diabetes referred for evaluation for syncope.  The patient has a history of syncope, status post Linq implantable monitor 09/20/2016 at which time the battery is depleted.  The patient has known history of coronary disease, status post drug-eluting stent LAD 01/06/2014, and status post CABG times 312/17/2019.  The patient denies chest pain.  She was at church earlier today, became diaphoretic, with brief loss of consciousness.  She presented to [facility] ED where ECG revealed sinus rhythm at 65 bpm.  Admission labs were unremarkable.  CT of the head was negative.  Vernard Gambles reported history of diarrhea 3 days prior to admission.  CT scan of the abdomen pelvis showed large colonic stool burden otherwise unremarkable.  Patient reports she feels she is back to normal.  Review of systems complete and found to be negative unless listed above     Past Medical History:  Diagnosis Date  . Anemia   . Anxiety   . Arthritis, degenerative   . Chronic cystitis   . Depression   . Diabetes mellitus without complication ([place])   . GERD (gastroesophageal reflux disease)   . Headache   . Heart disease   . HLD (hyperlipidemia)   . HTN (hypertension)   . Hypertension   . Incontinence in female   . Murmur   . Obesity   . Sleep apnea   . Urge incontinence of urine     Past Surgical History:  Procedure Laterality Date  . BREAST BIOPSY Right 2004   neg  . BUNIONECTOMY    . CARDIAC SURGERY     Stent  . CHOLECYSTECTOMY    . COLONOSCOPY WITH PROPOFOL N/A 06/14/2017   Procedure: COLONOSCOPY WITH PROPOFOL;  Surgeon: Lollie Sails, MD;  Location: [facility] ENDOSCOPY;   Service: Endoscopy;  Laterality: N/A;  . JOINT REPLACEMENT     TKR Bilateral  . LEFT HEART CATH AND CORONARY ANGIOGRAPHY N/A 05/22/2018   Procedure: LEFT HEART CATH AND CORONARY ANGIOGRAPHY;  Surgeon: Corey Skains, MD;  Location: [place] CV LAB;  Service: Cardiovascular;  Laterality: N/A;  . LOOP RECORDER INSERTION N/A 09/20/2016   Procedure: Loop Recorder Insertion;  Surgeon: Isaias Cowman, MD;  Location: [place] CV LAB;  Service: Cardiovascular;  Laterality: N/A;  . SEPTOPLASTY      (Not in a hospital admission)  Social History   Socioeconomic History  . Marital status: Widowed    Spouse name: Not on file  . Number of children: Not on file  . Years of education: Not on file  . Highest education level: Not on file  Occupational History  . Not on file  Tobacco Use  . Smoking status: Never Smoker  . Smokeless tobacco: Never Used  Substance and Sexual Activity  . Alcohol use: No  . Drug use: No  . Sexual activity: Not on file  Other Topics Concern  . Not on file  Social History Narrative  . Not on file   Social Determinants of Health   Financial Resource Strain:   . Difficulty of Paying Living Expenses: Not on file  Food Insecurity:   . Worried About Charity fundraiser in the Last Year: Not on file  . Ran Out of Food  in the Last Year: Not on file  Transportation Needs:   . Lack of Transportation (Medical): Not on file  . Lack of Transportation (Non-Medical): Not on file  Physical Activity:   . Days of Exercise per Week: Not on file  . Minutes of Exercise per Session: Not on file  Stress:   . Feeling of Stress : Not on file  Social Connections:   . Frequency of Communication with Friends and Family: Not on file  . Frequency of Social Gatherings with Friends and Family: Not on file  . Attends Religious Services: Not on file  . Active Member of Clubs or Organizations: Not on file  . Attends Archivist Meetings: Not on file  . Marital  Status: Not on file  Intimate Partner Violence:   . Fear of Current or Ex-Partner: Not on file  . Emotionally Abused: Not on file  . Physically Abused: Not on file  . Sexually Abused: Not on file    Family History  Problem Relation Age of Onset  . Breast cancer Cousin   . Skin cancer Other   . Diabetes Other   . Hypercholesterolemia Other   . Hypertension Other   . Bladder Cancer Maternal Aunt   . COPD Other   . Kidney disease Neg Hx   . Kidney cancer Neg Hx       Review of systems complete and found to be negative unless listed above      PHYSICAL EXAM  General: Well developed, well nourished, in no acute distress HEENT:  Normocephalic and atramatic Neck:  No JVD.  Lungs: Clear bilaterally to auscultation and percussion. Heart: HRRR . Normal S1 and S2 without gallops or murmurs.  Abdomen: Bowel sounds are positive, abdomen soft and non-tender  Msk:  Back normal, normal gait. Normal strength and tone for age. Extremities: No clubbing, cyanosis or edema.   Neuro: Alert and oriented X 3. Psych:  Good affect, responds appropriately  Labs:   Lab Results  Component Value Date   WBC 7.5 03/28/2020   HGB 12.0 03/28/2020   HCT 37.0 03/28/2020   MCV 81.1 03/28/2020   PLT 227 03/28/2020    Recent Labs  Lab 03/28/20 1156  NA 133*  K 3.5  CL 94*  CO2 26  BUN 13  CREATININE 1.16*  CALCIUM 9.1  PROT 6.5  BILITOT 0.6  ALKPHOS 56  ALT 20  AST 22  GLUCOSE 177*   Lab Results  Component Value Date   CKTOTAL 54 06/30/2011   CKMB < 0.5 (L) 06/30/2011   TROPONINI 0.24 (HH) 05/21/2018    Lab Results  Component Value Date   CHOL 141 06/30/2011   Lab Results  Component Value Date   HDL 63 (H) 06/30/2011   Lab Results  Component Value Date   LDLCALC 64 06/30/2011   Lab Results  Component Value Date   TRIG 71 06/30/2011   No results found for: CHOLHDL No results found for: LDLDIRECT    Radiology: CT Head Wo Contrast  Result Date: 03/28/2020 CLINICAL  DATA:  Syncope EXAM: CT HEAD WITHOUT CONTRAST TECHNIQUE: Contiguous axial images were obtained from the base of the skull through the vertex without intravenous contrast. COMPARISON:  October 01, 2015 FINDINGS: Brain: No evidence of acute infarction, hemorrhage, hydrocephalus, extra-axial collection or mass lesion/mass effect. Periventricular white matter hypodensities consistent with sequela of chronic microvascular ischemic disease. Vascular: No hyperdense vessel or unexpected calcification. Skull: Normal. Negative for fracture or focal lesion. Sinuses/Orbits: No  acute finding. Other: None. IMPRESSION: No acute intracranial abnormality. Electronically Signed   By: Valentino Saxon MD   On: 03/28/2020 13:39   MR BRAIN WO CONTRAST  Result Date: 03/28/2020 CLINICAL DATA:  Stroke suspected.  Syncopal episode. EXAM: MRI HEAD WITHOUT CONTRAST TECHNIQUE: Multiplanar, multiecho pulse sequences of the brain and surrounding structures were obtained without intravenous contrast. COMPARISON:  CT head without contrast 03/28/2020 FINDINGS: Brain: No acute infarct, hemorrhage, or mass lesion is present. The ventricles are of normal size. Moderate periventricular and subcortical T2 hyperintensities are present bilaterally. Vascular: Flow is present in the major intracranial arteries. Skull and upper cervical spine: The craniocervical junction is normal. Upper cervical spine is within normal limits. Marrow signal is unremarkable. Sinuses/Orbits: Fluid is present in the sphenoid sinuses bilaterally. The paranasal sinuses and mastoid air cells are otherwise clear. Bilateral lens replacements are noted. Globes and orbits are otherwise unremarkable. IMPRESSION: 1. No acute intracranial abnormality. 2. Moderate periventricular and subcortical T2 hyperintensities bilaterally are moderately advanced for age. This likely reflects the sequela of chronic microvascular ischemia. 3. Bilateral sphenoid sinus disease. Electronically Signed    By: San Morelle M.D.   On: 03/28/2020 16:06   CT ABDOMEN PELVIS W CONTRAST  Result Date: 03/28/2020 CLINICAL DATA:  Epigastric pain EXAM: CT ABDOMEN AND PELVIS WITH CONTRAST TECHNIQUE: Multidetector CT imaging of the abdomen and pelvis was performed using the standard protocol following bolus administration of intravenous contrast. CONTRAST:  28mL OMNIPAQUE IOHEXOL 300 MG/ML  SOLN COMPARISON:  February 18, 2011. April 14, 2016 FINDINGS: Lower chest: RIGHT lower lobe pulmonary nodule measures 2 mm, unchanged since 2017 and thereby benign (series 2, image 7). Mild centrilobular nodularity at the RIGHT lung base. Three-vessel coronary artery atherosclerotic calcifications. Loop recorder. Atherosclerotic calcifications of the thoracic aorta. Hepatobiliary: Status post cholecystectomy. There is mild central biliary ductal dilation. Common bile duct measures 9 mm. Enhancing 7 mm mass of the LEFT liver (series 2, image 26). Pancreas: Atrophic pancreas.  No peripancreatic fat stranding. Spleen: Normal in size without focal abnormality.  Splenule. Adrenals/Urinary Tract: Adrenals are unremarkable. RIGHT-sided renal cysts. No hydronephrosis. Kidneys enhance symmetrically. Bladder is decompressed. Stomach/Bowel: Small hiatal hernia. The esophagus is partially fluid-filled. High density material within the stomach likely reflecting enteric debris. No evidence of bowel obstruction. There is a likely internal mesenteric defect within the RIGHT mid abdomen given swirling appearance of the mesentery at this location (series 5, image 30). It contains cecum and redundant sigmoid colon. The cecum is located in the RIGHT upper quadrant. Appendix is normal. There is a large colonic stool burden throughout the RIGHT colon and transverse colon Vascular/Lymphatic: Severe atherosclerotic calcifications. No suspicious lymphadenopathy. Reproductive: Uterus and bilateral adnexa are unremarkable. Other: Nonobstructive small  bowel containing LEFT inguinal hernia. Musculoskeletal: Remote RIGHT pelvic fracture. IMPRESSION: 1. Large colonic stool burden throughout the RIGHT colon and transverse colon. 2. There is a likely internal nonobstructed mesenteric defect within the RIGHT mid abdomen given swirling appearance of the mesentery at this location. It contains stool filled RIGHT colon and redundant sigmoid colon. No evidence of bowel obstruction. This may be contributing to patient constipation. 3. Nonobstructive small bowel containing LEFT inguinal hernia. 4. Enhancing 7 mm mass of the LEFT liver. In the absence of known malignancy, this likely represents a flash filling hemangioma. This could be further evaluated with dedicated three-phase CT or hepatic MRI if clinically indicated. 5. Mild central biliary ductal dilation, likely related to prior cholecystectomy. Correlate with liver function tests. 6. Mild centrilobular  nodularity at the RIGHT lung base which is favored to reflect sequela of aspiration given fluid within the esophagus and small hiatal hernia. Aortic Atherosclerosis (ICD10-I70.0). Electronically Signed   By: Valentino Saxon MD   On: 03/28/2020 14:04    EKG: Sinus rhythm at 65 bpm  ASSESSMENT AND PLAN:   1.  Syncope, probable vasovagal in nature, normal ECG, currently asymptomatic without chest pain or shortness of breath 2.  Coronary artery disease, status post stent LAD, status post CABG x3, currently without chest pain or shortness of breath 3.  Essential hypertension, on carvedilol, losartan and hydrochlorothiazide  Recommendations  1.  Agree with current therapy 2.  Monitor on telemetry 3.  Probable discharge in a.m., will arrange for Holter monitor prior to discharge 4.  Follow-up with Dr. Ubaldo Glassing in 1 to 2 weeks following discharge  Signed: Isaias Cowman MD,PhD, [facility] 03/28/2020, 7:04 PM

## 2020-03-28 NOTE — H&P (Addendum)
History and Physical    Brandi [name] JSH:[id number] DOB: 1936-01-28 DOA: 03/28/2020  PCP: Baxter Hire, MD   Patient coming from: Home  I have personally briefly reviewed patient's old medical records in [place]  Chief Complaint: Syncope  HPI: Brandi [name] is a 84 y.o. female with medical history significant for hypertension, depression, diabetes mellitus, GERD and anxiety disorder who was brought into the ER by EMS after she had a witnessed syncopal episode while at church.  Patient was said to have been extremely diaphoretic during the event and per witnesses she was out for about 4 to 5 minutes.  Patient states that prior to passing out she felt lightheaded and was sitting down in the pew.  She has had syncopal episodes in the past. Per EMS they reported slight decreased sensation over her right side when compared to the left and were concerned about possible strokelike symptoms.  Patient is able to move all extremities. She denies having any chest pain, no shortness of breath, no nausea, no vomiting.  She did have an episode of diarrhea 3 days prior to her admission and denies having any abdominal pain.  She denies having any fever, no chills, no cough, no headaches. Labs show sodium 133, potassium 3.5, chloride 94, bicarb 26, glucose 177, BUN 13, creatinine 1.16, calcium 9.1, alkaline phosphatase 56, albumin 3.5, lipase 18, AST 22, ALT 20, total protein 6.5, white count 7.5, hemoglobin 12.0, hematocrit 37, MCV 81, RDW 14.3, platelet count 227 Respiratory viral panel is negative CT scan of abdomen and pelvis shows a large colonic stool burden throughout the right colon and transverse colon.  There is a likely internal nonobstructed mesenteric defect within the right mid abdomen given swelling appearance of the mesentery at this location.  He continues to feel right colon redundant sigmoid colon.  No evidence of bowel obstruction.  Nonobstructive small bowel containing left  inguinal hernia. CT scan of the head without contrast shows no acute intracranial abnormality. Twelve-lead EKG reviewed by me shows sinus rhythm     ED Course: Patient is an 84 year old Caucasian female who was brought into the emergency room by EMS after she had a witnessed syncopal episode while at church.  The EMT was also concerned about decreased sensation over the right side which has resolved.  Patient is able to move all her extremities.  She will be referred to observation status for further evaluation.  Review of Systems: As per HPI otherwise 10 point review of systems negative.    Past Medical History:  Diagnosis Date  . Anemia   . Anxiety   . Arthritis, degenerative   . Chronic cystitis   . Depression   . Diabetes mellitus without complication ([place])   . GERD (gastroesophageal reflux disease)   . Headache   . Heart disease   . HLD (hyperlipidemia)   . HTN (hypertension)   . Hypertension   . Incontinence in female   . Murmur   . Obesity   . Sleep apnea   . Urge incontinence of urine     Past Surgical History:  Procedure Laterality Date  . BREAST BIOPSY Right 2004   neg  . BUNIONECTOMY    . CARDIAC SURGERY     Stent  . CHOLECYSTECTOMY    . COLONOSCOPY WITH PROPOFOL N/A 06/14/2017   Procedure: COLONOSCOPY WITH PROPOFOL;  Surgeon: Lollie Sails, MD;  Location: [facility] ENDOSCOPY;  Service: Endoscopy;  Laterality: N/A;  . JOINT REPLACEMENT  TKR Bilateral  . LEFT HEART CATH AND CORONARY ANGIOGRAPHY N/A 05/22/2018   Procedure: LEFT HEART CATH AND CORONARY ANGIOGRAPHY;  Surgeon: Corey Skains, MD;  Location: [place] [facility];  Service: Cardiovascular;  Laterality: N/A;  . LOOP RECORDER INSERTION N/A 09/20/2016   Procedure: Loop Recorder Insertion;  Surgeon: Isaias Cowman, MD;  Location: [place] [facility];  Service: Cardiovascular;  Laterality: N/A;  . SEPTOPLASTY       reports that she has never smoked. She has never used smokeless tobacco. She  reports that she does not drink alcohol and does not use drugs.  Allergies  Allergen Reactions  . Micardis [Telmisartan] Other (See Comments)    Dizziness  . Accupril [Quinapril Hcl] Rash  . Quinapril Rash    Family History  Problem Relation Age of Onset  . Breast cancer Cousin   . Skin cancer Other   . Diabetes Other   . Hypercholesterolemia Other   . Hypertension Other   . Bladder Cancer Maternal Aunt   . COPD Other   . Kidney disease Neg Hx   . Kidney cancer Neg Hx      Prior to Admission medications   Medication Sig Start Date End Date Taking? Authorizing Provider  acetaminophen (TYLENOL) 500 MG tablet Take by mouth.    [provider]  albuterol (VENTOLIN HFA) 108 (90 Base) MCG/ACT inhaler  12/27/18   [provider]  aspirin EC 81 MG tablet Take 81 mg by mouth daily.    [provider]  atorvastatin (LIPITOR) 40 MG tablet Take by mouth. 08/06/18   [provider]  budesonide-formoterol (SYMBICORT) 80-4.5 MCG/ACT inhaler Inhale 2 puffs into the lungs 2 (two) times daily.    [provider]  Calcium Carb-Cholecalciferol (CALCIUM + D3 PO) Take 1 tablet by mouth daily.    [provider]  carboxymethylcellulose (REFRESH TEARS) 0.5 % SOLN Apply 1-2 drops to eye at bedtime.     [provider]  carvedilol (COREG) 6.25 MG tablet Take 6.25 mg by mouth 2 (two) times daily with a meal.     [provider]  clopidogrel (PLAVIX) 75 MG tablet Take 75 mg by mouth daily.    [provider]  conjugated estrogens (PREMARIN) vaginal cream Place 1 Applicatorful vaginally daily. Apply 0.5mg  (pea-sized amount)  just inside the vaginal introitus with a finger-tip every night for two weeks and then Monday, Wednesday and Friday nights. 05/04/16   Zara Council A, PA-C  Cyanocobalamin (VITAMIN B-12 PO) Take 1 tablet by mouth daily.    [provider]  gabapentin (NEURONTIN) 100 MG capsule  12/27/18   [provider]  glipiZIDE (GLUCOTROL) 5 MG tablet Take 5 mg by mouth 2 (two) times daily.  08/19/13   [provider]  hydrochlorothiazide (HYDRODIURIL) 12.5 MG tablet  12/02/17   [provider]  Insulin Glargine (LANTUS SOLOSTAR) 100 UNIT/ML Solostar Pen Inject 12 Units into the skin at bedtime.  07/20/15   [provider]  JANUMET XR 50-1000 MG TB24 Take 1 tablet by mouth 2 (two) times daily. 10/02/18   [provider]  losartan (COZAAR) 100 MG tablet Take 100 mg by mouth daily.    [provider]  meloxicam (MOBIC) 15 MG tablet Take 15 mg by mouth daily.    [provider]  Multiple Vitamins-Minerals (PRESERVISION/LUTEIN) CAPS Take 1 capsule by mouth 2 (two) times daily.  03/12/12   [provider]  pantoprazole (PROTONIX) 40 MG tablet Take  40 mg by mouth daily.    [provider]  prasugrel (EFFIENT) 10 MG TABS tablet Take 10 mg by mouth daily.    [provider]  pregabalin (LYRICA) 75 MG capsule Take 75 mg by mouth 2 (two) times daily.    [provider]  senna-docusate (SENOKOT-S) 8.6-50 MG tablet Take 1 tablet by mouth at bedtime as needed for mild constipation. 05/24/18   [name], MD  simvastatin (ZOCOR) 20 MG tablet Take 20 mg by mouth daily at 6 PM.     [provider]  SitaGLIPtin-MetFORMIN HCl (JANUMET XR) 50-1000 MG TB24 Take 1 tablet by mouth 2 (two) times daily. 12/02/14   [provider]  sodium chloride (ALTAMIST SPRAY) 0.65 % nasal spray Place into the nose.    [provider]  traZODone (DESYREL) 50 MG tablet  12/27/18   [provider]  venlafaxine [facility]) 37.5 MG tablet  12/27/18   [provider]  Verapamil HCl CR 300 MG CP24 Take 1 capsule by mouth daily.    [provider]  Vibegron (GEMTESA) 75 MG TABS Take 75 mg by mouth daily. 03/19/20   Nori Riis, PA-C    Physical Exam: Vitals:   03/28/20 1153 03/28/20 1154  03/28/20 1442 03/28/20 1500  BP: (!) 152/69  (!) 153/84 (!) 181/80  Pulse: 65  62 71  Resp: 20  20 (!) 21  Temp: 98.1 F (36.7 C)     TempSrc: Oral     SpO2: 97%  97% 99%  Weight:  86.2 kg    Height:  5\' 1"  (1.549 m)       Vitals:   03/28/20 1153 03/28/20 1154 03/28/20 1442 03/28/20 1500  BP: (!) 152/69  (!) 153/84 (!) 181/80  Pulse: 65  62 71  Resp: 20  20 (!) 21  Temp: 98.1 F (36.7 C)     TempSrc: Oral     SpO2: 97%  97% 99%  Weight:  86.2 kg    Height:  5\' 1"  (1.549 m)      Constitutional: NAD, alert and oriented x 3 Eyes: PERRL, lids and conjunctivae normal ENMT: Mucous membranes are moist.  Neck: normal, supple, no masses, no thyromegaly Respiratory: clear to auscultation bilaterally, no wheezing, no crackles. Normal respiratory effort. No accessory muscle use.  Cardiovascular: Regular rate and rhythm, no murmurs / rubs / gallops. No extremity edema. 2+ pedal pulses. No carotid bruits.  Abdomen: no tenderness, no masses palpated. No hepatosplenomegaly. Bowel sounds positive.  Musculoskeletal: no clubbing / cyanosis. No joint deformity upper and lower extremities.  Skin: no rashes, lesions, ulcers.  Neurologic: No gross focal neurologic deficit.  Able to move all her extremities Psychiatric: Normal mood and affect.   Labs on Admission: I have personally reviewed following labs and imaging studies  CBC: Recent Labs  Lab 03/28/20 1156  WBC 7.5  HGB 12.0  HCT 37.0  MCV 81.1  PLT 425   Basic Metabolic Panel: Recent Labs  Lab 03/28/20 1156  NA 133*  K 3.5  CL 94*  CO2 26  GLUCOSE 177*  BUN 13  CREATININE 1.16*  CALCIUM 9.1   GFR: Estimated Creatinine Clearance: 37.3 mL/min (A) (by C-G formula based on SCr of 1.16 mg/dL (H)). Liver Function Tests: Recent Labs  Lab 03/28/20 1156  AST 22  ALT 20  ALKPHOS 56  BILITOT 0.6  PROT 6.5  ALBUMIN 3.5   Recent Labs  Lab 03/28/20 1156  LIPASE 18  No results for input(s): AMMONIA in the last 168  hours. Coagulation Profile: No results for input(s): INR, PROTIME in the last 168 hours. Cardiac Enzymes: No results for input(s): CKTOTAL, CKMB, CKMBINDEX, TROPONINI in the last 168 hours. BNP (last 3 results) No results for input(s): PROBNP in the last 8760 hours. HbA1C: No results for input(s): HGBA1C in the last 72 hours. CBG: No results for input(s): GLUCAP in the last 168 hours. Lipid Profile: No results for input(s): CHOL, HDL, LDLCALC, TRIG, CHOLHDL, LDLDIRECT in the last 72 hours. Thyroid Function Tests: No results for input(s): TSH, T4TOTAL, FREET4, T3FREE, THYROIDAB in the last 72 hours. Anemia Panel: No results for input(s): VITAMINB12, FOLATE, FERRITIN, TIBC, IRON, RETICCTPCT in the last 72 hours. Urine analysis:    Component Value Date/Time   COLORURINE STRAW (A) 07/30/2016 1309   APPEARANCEUR Cloudy (A) 09/20/2017 1106   LABSPEC 1.005 07/30/2016 1309   LABSPEC 1.010 02/22/2012 1204   PHURINE 7.0 07/30/2016 1309   GLUCOSEU Negative 09/20/2017 1106   GLUCOSEU Negative 02/22/2012 1204   HGBUR NEGATIVE 07/30/2016 1309   BILIRUBINUR Negative 09/20/2017 1106   BILIRUBINUR Negative 02/22/2012 1204   KETONESUR NEGATIVE 07/30/2016 1309   PROTEINUR Trace (A) 09/20/2017 1106   PROTEINUR NEGATIVE 07/30/2016 1309   NITRITE Positive (A) 09/20/2017 1106   NITRITE NEGATIVE 07/30/2016 1309   LEUKOCYTESUR 3+ (A) 09/20/2017 1106   LEUKOCYTESUR 2+ 02/22/2012 1204    Radiological Exams on Admission: CT Head Wo Contrast  Result Date: 03/28/2020 CLINICAL DATA:  Syncope EXAM: CT HEAD WITHOUT CONTRAST TECHNIQUE: Contiguous axial images were obtained from the base of the skull through the vertex without intravenous contrast. COMPARISON:  October 01, 2015 FINDINGS: Brain: No evidence of acute infarction, hemorrhage, hydrocephalus, extra-axial collection or mass lesion/mass effect. Periventricular white matter hypodensities consistent with sequela of chronic microvascular ischemic  disease. Vascular: No hyperdense vessel or unexpected calcification. Skull: Normal. Negative for fracture or focal lesion. Sinuses/Orbits: No acute finding. Other: None. IMPRESSION: No acute intracranial abnormality. Electronically Signed   By: Valentino Saxon MD   On: 03/28/2020 13:39   CT ABDOMEN PELVIS W CONTRAST  Result Date: 03/28/2020 CLINICAL DATA:  Epigastric pain EXAM: CT ABDOMEN AND PELVIS WITH CONTRAST TECHNIQUE: Multidetector CT imaging of the abdomen and pelvis was performed using the standard protocol following bolus administration of intravenous contrast. CONTRAST:  39mL OMNIPAQUE IOHEXOL 300 MG/ML  SOLN COMPARISON:  February 18, 2011. April 14, 2016 FINDINGS: Lower chest: RIGHT lower lobe pulmonary nodule measures 2 mm, unchanged since 2017 and thereby benign (series 2, image 7). Mild centrilobular nodularity at the RIGHT lung base. Three-vessel coronary artery atherosclerotic calcifications. Loop recorder. Atherosclerotic calcifications of the thoracic aorta. Hepatobiliary: Status post cholecystectomy. There is mild central biliary ductal dilation. Common bile duct measures 9 mm. Enhancing 7 mm mass of the LEFT liver (series 2, image 26). Pancreas: Atrophic pancreas.  No peripancreatic fat stranding. Spleen: Normal in size without focal abnormality.  Splenule. Adrenals/Urinary Tract: Adrenals are unremarkable. RIGHT-sided renal cysts. No hydronephrosis. Kidneys enhance symmetrically. Bladder is decompressed. Stomach/Bowel: Small hiatal hernia. The esophagus is partially fluid-filled. High density material within the stomach likely reflecting enteric debris. No evidence of bowel obstruction. There is a likely internal mesenteric defect within the RIGHT mid abdomen given swirling appearance of the mesentery at this location (series 5, image 30). It contains cecum and redundant sigmoid colon. The cecum is located in the RIGHT upper quadrant. Appendix is normal. There is a large colonic stool  burden throughout the RIGHT  colon and transverse colon Vascular/Lymphatic: Severe atherosclerotic calcifications. No suspicious lymphadenopathy. Reproductive: Uterus and bilateral adnexa are unremarkable. Other: Nonobstructive small bowel containing LEFT inguinal hernia. Musculoskeletal: Remote RIGHT pelvic fracture. IMPRESSION: 1. Large colonic stool burden throughout the RIGHT colon and transverse colon. 2. There is a likely internal nonobstructed mesenteric defect within the RIGHT mid abdomen given swirling appearance of the mesentery at this location. It contains stool filled RIGHT colon and redundant sigmoid colon. No evidence of bowel obstruction. This may be contributing to patient constipation. 3. Nonobstructive small bowel containing LEFT inguinal hernia. 4. Enhancing 7 mm mass of the LEFT liver. In the absence of known malignancy, this likely represents a flash filling hemangioma. This could be further evaluated with dedicated three-phase CT or hepatic MRI if clinically indicated. 5. Mild central biliary ductal dilation, likely related to prior cholecystectomy. Correlate with liver function tests. 6. Mild centrilobular nodularity at the RIGHT lung base which is favored to reflect sequela of aspiration given fluid within the esophagus and small hiatal hernia. Aortic Atherosclerosis (ICD10-I70.0). Electronically Signed   By: Valentino Saxon MD   On: 03/28/2020 14:04    EKG: Independently reviewed.  Sinus rhythm  Assessment/Plan Principal Problem:   Syncope and collapse Active Problems:   Benign essential hypertension   Diabetes mellitus type 2, insulin dependent (HCC)   CAD (coronary artery disease)     Syncope and collapse Unclear etiology Patient was sitting at church when she passed out and was extremely diaphoretic She denies having any chest pain or shortness of breath She had a 2D echocardiogram within the last 1 year which shows a normal LVEF of 50% We will place patient on a  cardiac monitor to rule out arrhythmias as a cause of her syncope We will consult cardiology   Coronary artery disease Patient has a history of coronary artery disease and is status post CABG Continue aspirin,  beta-blockers and statins   Diabetes mellitus Maintain consistent carbohydrate diet Glycemic control with sliding scale insulin   Hypertension Continue carvedilol, losartan and hydrochlorothiazide    Depression Continue trazodone and venlafaxine   COPD Not acutely exacerbated Continue as needed bronchodilator therapy as well as inhaled steroids   TIA Symptoms have resolved We will obtain MRI of the brain for further evaluation   DVT prophylaxis: Lovenox Code Status: Full code Family Communication: Greater than 50% of time was spent discussing patient's condition and plan of care with her and her son at the bedside.  All questions and concerns have been addressed.  He verbalized understanding and agree with the plan. Disposition Plan: Back to previous home environment Consults called: Cardiology    Bracen Schum MD [facility]     03/28/2020, 3:08 PM

## 2020-03-28 NOTE — ED Notes (Signed)
Pt states coming in after having a syncopal episode at church.

## 2020-03-28 NOTE — ED Notes (Signed)
Neuro MD in to evaluate patient. Husband is at bedside. Patient has just returned from CT.

## 2020-03-28 NOTE — ED Notes (Signed)
Patient states to MD that she has had some recent diarrhea episodes. Denies bloody diarrhea, CP or shob. Patient able to answer questions appropriately.

## 2020-03-29 DIAGNOSIS — R55 Syncope and collapse: Secondary | ICD-10-CM | POA: Diagnosis not present

## 2020-03-29 DIAGNOSIS — I10 Essential (primary) hypertension: Secondary | ICD-10-CM | POA: Diagnosis not present

## 2020-03-29 LAB — BASIC METABOLIC PANEL
Anion gap: 12 (ref 5–15)
BUN: 14 mg/dL (ref 8–23)
CO2: 28 mmol/L (ref 22–32)
Calcium: 8.6 mg/dL — ABNORMAL LOW (ref 8.9–10.3)
Chloride: 95 mmol/L — ABNORMAL LOW (ref 98–111)
Creatinine, Ser: 0.91 mg/dL (ref 0.44–1.00)
GFR, Estimated: 59 mL/min — ABNORMAL LOW (ref >60)
Glucose, Bld: 64 mg/dL — ABNORMAL LOW (ref 70–99)
Potassium: 3 mmol/L — ABNORMAL LOW (ref 3.5–5.1)
Sodium: 135 mmol/L (ref 135–145)

## 2020-03-29 LAB — CBC
HCT: 33.5 % — ABNORMAL LOW (ref 36.0–46.0)
Hemoglobin: 11.1 g/dL — ABNORMAL LOW (ref 12.0–15.0)
MCH: 26.4 pg (ref 26.0–34.0)
MCHC: 33.1 g/dL (ref 30.0–36.0)
MCV: 79.6 fL — ABNORMAL LOW (ref 80.0–100.0)
Platelets: 233 10*3/uL (ref 150–400)
RBC: 4.21 MIL/uL (ref 3.87–5.11)
RDW: 14.3 % (ref 11.5–15.5)
WBC: 7.1 10*3/uL (ref 4.0–10.5)
nRBC: 0 % (ref 0.0–0.2)

## 2020-03-29 LAB — GLUCOSE, CAPILLARY
Glucose-Capillary: 68 mg/dL — ABNORMAL LOW (ref 70–99)
Glucose-Capillary: 74 mg/dL (ref 70–99)
Glucose-Capillary: 236 mg/dL — ABNORMAL HIGH (ref 70–99)

## 2020-03-29 MED ORDER — SENNOSIDES-DOCUSATE SODIUM 8.6-50 MG PO TABS: 1.0000 | ORAL_TABLET | Freq: Every evening | ORAL | 0 refills | Status: DC | PRN

## 2020-03-29 MED ORDER — LABETALOL HCL 5 MG/ML IV SOLN
INTRAVENOUS | Status: AC
  Administered 2020-03-29: 10 mg via INTRAVENOUS
  Filled 2020-03-29: qty 4

## 2020-03-29 MED ORDER — PREGABALIN 75 MG PO CAPS: 75.0000 mg | ORAL_CAPSULE | Freq: Two times a day (BID) | ORAL | 1 refills | Status: DC

## 2020-03-29 MED ORDER — PRESERVISION/LUTEIN PO CAPS: 1.0000 | ORAL_CAPSULE | Freq: Two times a day (BID) | ORAL | 1 refills | Status: AC

## 2020-03-29 MED ORDER — TRAZODONE HCL 50 MG PO TABS: 50.0000 mg | ORAL_TABLET | Freq: Every evening | ORAL | 0 refills | Status: DC | PRN

## 2020-03-29 NOTE — Progress Notes (Signed)
Patient discharged home with son via private vehicle. Discharge instructions reviewed and questions answered. IV removed without complications.

## 2020-03-29 NOTE — Discharge Summary (Signed)
Physician Discharge Summary  Brandi [name] FKC:[id number] DOB: 27-Mar-1936 DOA: 03/28/2020  PCP: Baxter Hire, MD  Admit date: 03/28/2020 Discharge date: 03/31/2020  Admitted From: home Disposition:  home  Recommendations for Outpatient Follow-up:  1. Follow up with PCP in 1-2 weeks 2. Please obtain BMP/CBC in one week 3. Please follow up with Dr. Ubaldo Glassing in 2 weeks 4. Follow up on ambulatory heart monitor 5. Echo was deferred, to be performed as outpatient  Home Health: No  Equipment/Devices: None   Discharge Condition: Stable  CODE STATUS: Full  Diet recommendation: Heart Healthy / Carb Modified  Discharge Diagnoses: Principal Problem:   Syncope and collapse Active Problems:   Benign essential hypertension   Diabetes mellitus type 2, insulin dependent (HCC)   CAD (coronary artery disease)    Summary of HPI and Hospital Course:  HPI on Admission, by Dr. Francine Graven: "Brandi [name] is a 84 y.o. female with medical history significant for hypertension, depression, diabetes mellitus, GERD and anxiety disorder who was brought into the ER by EMS after she had a witnessed syncopal episode while at church.  Patient was said to have been extremely diaphoretic during the event and per witnesses she was out for about 4 to 5 minutes.  Patient states that prior to passing out she felt lightheaded and was sitting down in the pew.  She has had syncopal episodes in the past. Per EMS they reported slight decreased sensation over her right side when compared to the left and were concerned about possible strokelike symptoms.  Patient is able to move all extremities...."  Review of systems was otherwise negative.  ED evaluation:  Notable Labs - sodium 133, potassium 3.5, chloride 94, glucose 177, creatinine 1.16, white count 7.5j. Imaging - noncontrast head CT negative for acute intracranial abnormalities.   EKG normal sinus rhythm.   Admitted to hospitalist service for observation.   Cardiology consulted.   Syncope and Collapse - Suspect vasovagal etiology.  No associated chest pain or SOB.  Echo within past year normal EF 50%.  Normal ECG. Monitored on telemetry with no significant events. Seen by cardiology.  Recommended for 1 week ambulatory heart monitor which was placed on patient morning of discharge. Follow up with Dr. Ubaldo Glassing in 2 weeks. Patient remained asymptomatic for the duration of her stay, and noted to be ambulating safely.  Clinically improved and stable for discharge.      Discharge Instructions   Discharge Instructions    Call MD for:   Complete by: As directed    Chest pain, heart palpitations or racing heart, fainting / loss of consciousness.   Call MD for:  extreme fatigue   Complete by: As directed    Call MD for:  persistant dizziness or light-headedness   Complete by: As directed    Call MD for:  persistant nausea and vomiting   Complete by: As directed    Call MD for:  severe uncontrolled pain   Complete by: As directed    Call MD for:  temperature >100.4   Complete by: As directed    Diet - low sodium heart healthy   Complete by: As directed    Discharge instructions   Complete by: As directed    Follow up with Dr. Ubaldo Glassing in 2 weeks in clinic.   Increase activity slowly   Complete by: As directed      Allergies as of 03/29/2020      Reactions   Accupril [quinapril Hcl] Rash  Micardis [telmisartan] Other (See Comments)   Dizziness   Quinapril Rash      Medication List    TAKE these medications   acetaminophen 325 MG tablet Commonly known as: TYLENOL Take 650 mg by mouth every 6 (six) hours as needed.   albuterol 108 (90 Base) MCG/ACT inhaler Commonly known as: VENTOLIN HFA Inhale 1-2 puffs into the lungs every 6 (six) hours as needed for wheezing or shortness of breath.   [employer]ist Spray 0.65 % nasal spray Generic drug: sodium chloride Place into the nose. Notes to patient: Last dose yesterday at 7:25 PM   aspirin  81 MG chewable tablet Chew 81 mg by mouth daily.   atorvastatin 40 MG tablet Commonly known as: LIPITOR Take 40 mg by mouth every evening.   budesonide-formoterol 80-4.5 MCG/ACT inhaler Commonly known as: SYMBICORT Inhale 2 puffs into the lungs 2 (two) times daily.   CALCIUM + D3 PO Take 1 tablet by mouth daily.   carvedilol 12.5 MG tablet Commonly known as: COREG Take 12.5 mg by mouth 2 (two) times daily.   famotidine 20 MG tablet Commonly known as: PEPCID Take 20 mg by mouth 2 (two) times daily.   furosemide 20 MG tablet Commonly known as: LASIX Take 20 mg by mouth 2 (two) times daily.   gabapentin 100 MG capsule Commonly known as: NEURONTIN Take 200 mg by mouth 2 (two) times daily.   Gemtesa 75 MG Tabs Generic drug: Vibegron Take 75 mg by mouth daily.   glipiZIDE 5 MG tablet Commonly known as: GLUCOTROL Take 5 mg by mouth 2 (two) times daily.   hydrochlorothiazide 25 MG tablet Commonly known as: HYDRODIURIL Take 25 mg by mouth daily.   Janumet XR 50-1000 MG Tb24 Generic drug: SitaGLIPtin-MetFORMIN HCl Take 1 tablet by mouth 2 (two) times daily.   Lantus SoloStar 100 UNIT/ML Solostar Pen Generic drug: insulin glargine Inject 12 Units into the skin at bedtime.   losartan 100 MG tablet Commonly known as: COZAAR Take 100 mg by mouth daily.   nortriptyline 25 MG capsule Commonly known as: PAMELOR Take 25 mg by mouth at bedtime.   pantoprazole 20 MG tablet Commonly known as: PROTONIX Take 20 mg by mouth 2 (two) times daily.   prasugrel 10 MG Tabs tablet Commonly known as: EFFIENT Take 10 mg by mouth daily.   pregabalin 75 MG capsule Commonly known as: LYRICA Take 1 capsule (75 mg total) by mouth 2 (two) times daily.   PreserVision/Lutein Caps Take 1 capsule by mouth 2 (two) times daily.   Refresh Tears 0.5 % Soln Generic drug: carboxymethylcellulose Apply 1-2 drops to eye at bedtime.   senna-docusate 8.6-50 MG tablet Commonly known as:  Senokot-S Take 1 tablet by mouth at bedtime as needed for mild constipation.   sucralfate 1 g tablet Commonly known as: CARAFATE Take 1 g by mouth 2 (two) times daily.   traZODone 50 MG tablet Commonly known as: DESYREL Take 1 tablet (50 mg total) by mouth at bedtime as needed for sleep. What changed:   how much to take  how to take this  when to take this  reasons to take this   venlafaxine 37.5 MG tablet Commonly known as: EFFEXOR Take 37.5 mg by mouth 2 (two) times daily.   Verapamil HCl CR 300 MG Cp24 Take 300 capsules by mouth daily.   VITAMIN B-12 PO Take 1 tablet by mouth daily.       Follow-up Information    Fath, Javier Docker, MD.  Go in 2 week(s).   Specialty: Cardiology Contact information: [address] [place] [address] [phone]              Allergies  Allergen Reactions  . Accupril [Quinapril Hcl] Rash  . Micardis [Telmisartan] Other (See Comments)    Dizziness  . Quinapril Rash    Consultations:  Cardiology   Procedures/Studies: CT Head Wo Contrast  Result Date: 03/28/2020 CLINICAL DATA:  Syncope EXAM: CT HEAD WITHOUT CONTRAST TECHNIQUE: Contiguous axial images were obtained from the base of the skull through the vertex without intravenous contrast. COMPARISON:  October 01, 2015 FINDINGS: Brain: No evidence of acute infarction, hemorrhage, hydrocephalus, extra-axial collection or mass lesion/mass effect. Periventricular white matter hypodensities consistent with sequela of chronic microvascular ischemic disease. Vascular: No hyperdense vessel or unexpected calcification. Skull: Normal. Negative for fracture or focal lesion. Sinuses/Orbits: No acute finding. Other: None. IMPRESSION: No acute intracranial abnormality. Electronically Signed   By: Valentino Saxon MD   On: 03/28/2020 13:39   MR BRAIN WO CONTRAST  Result Date: 03/28/2020 CLINICAL DATA:  Stroke suspected.  Syncopal episode. EXAM: MRI HEAD WITHOUT CONTRAST  TECHNIQUE: Multiplanar, multiecho pulse sequences of the brain and surrounding structures were obtained without intravenous contrast. COMPARISON:  CT head without contrast 03/28/2020 FINDINGS: Brain: No acute infarct, hemorrhage, or mass lesion is present. The ventricles are of normal size. Moderate periventricular and subcortical T2 hyperintensities are present bilaterally. Vascular: Flow is present in the major intracranial arteries. Skull and upper cervical spine: The craniocervical junction is normal. Upper cervical spine is within normal limits. Marrow signal is unremarkable. Sinuses/Orbits: Fluid is present in the sphenoid sinuses bilaterally. The paranasal sinuses and mastoid air cells are otherwise clear. Bilateral lens replacements are noted. Globes and orbits are otherwise unremarkable. IMPRESSION: 1. No acute intracranial abnormality. 2. Moderate periventricular and subcortical T2 hyperintensities bilaterally are moderately advanced for age. This likely reflects the sequela of chronic microvascular ischemia. 3. Bilateral sphenoid sinus disease. Electronically Signed   By: San Morelle M.D.   On: 03/28/2020 16:06   CT ABDOMEN PELVIS W CONTRAST  Result Date: 03/28/2020 CLINICAL DATA:  Epigastric pain EXAM: CT ABDOMEN AND PELVIS WITH CONTRAST TECHNIQUE: Multidetector CT imaging of the abdomen and pelvis was performed using the standard protocol following bolus administration of intravenous contrast. CONTRAST:  34mL OMNIPAQUE IOHEXOL 300 MG/ML  SOLN COMPARISON:  February 18, 2011. April 14, 2016 FINDINGS: Lower chest: RIGHT lower lobe pulmonary nodule measures 2 mm, unchanged since 2017 and thereby benign (series 2, image 7). Mild centrilobular nodularity at the RIGHT lung base. Three-vessel coronary artery atherosclerotic calcifications. Loop recorder. Atherosclerotic calcifications of the thoracic aorta. Hepatobiliary: Status post cholecystectomy. There is mild central biliary ductal  dilation. Common bile duct measures 9 mm. Enhancing 7 mm mass of the LEFT liver (series 2, image 26). Pancreas: Atrophic pancreas.  No peripancreatic fat stranding. Spleen: Normal in size without focal abnormality.  Splenule. Adrenals/Urinary Tract: Adrenals are unremarkable. RIGHT-sided renal cysts. No hydronephrosis. Kidneys enhance symmetrically. Bladder is decompressed. Stomach/Bowel: Small hiatal hernia. The esophagus is partially fluid-filled. High density material within the stomach likely reflecting enteric debris. No evidence of bowel obstruction. There is a likely internal mesenteric defect within the RIGHT mid abdomen given swirling appearance of the mesentery at this location (series 5, image 30). It contains cecum and redundant sigmoid colon. The cecum is located in the RIGHT upper quadrant. Appendix is normal. There is a large colonic stool burden throughout the RIGHT colon and transverse colon  Vascular/Lymphatic: Severe atherosclerotic calcifications. No suspicious lymphadenopathy. Reproductive: Uterus and bilateral adnexa are unremarkable. Other: Nonobstructive small bowel containing LEFT inguinal hernia. Musculoskeletal: Remote RIGHT pelvic fracture. IMPRESSION: 1. Large colonic stool burden throughout the RIGHT colon and transverse colon. 2. There is a likely internal nonobstructed mesenteric defect within the RIGHT mid abdomen given swirling appearance of the mesentery at this location. It contains stool filled RIGHT colon and redundant sigmoid colon. No evidence of bowel obstruction. This may be contributing to patient constipation. 3. Nonobstructive small bowel containing LEFT inguinal hernia. 4. Enhancing 7 mm mass of the LEFT liver. In the absence of known malignancy, this likely represents a flash filling hemangioma. This could be further evaluated with dedicated three-phase CT or hepatic MRI if clinically indicated. 5. Mild central biliary ductal dilation, likely related to prior  cholecystectomy. Correlate with liver function tests. 6. Mild centrilobular nodularity at the RIGHT lung base which is favored to reflect sequela of aspiration given fluid within the esophagus and small hiatal hernia. Aortic Atherosclerosis (ICD10-I70.0). Electronically Signed   By: Valentino Saxon MD   On: 03/28/2020 14:04       Subjective: Pt seen this AM at bedside.  She reports feeling well.  No chest pain, SOB, dizziness or lightheadedness, no palpitations.  No unilateral weakness, numbness or tingling.  States she hopes to go home later today.  Son returned to room, had no questions, agreeable with plan.     Discharge Exam: Vitals:   03/29/20 0900 03/29/20 1038  BP: (!) 133/119   Pulse: 82   Resp: 16   Temp:  98.3 F (36.8 C)  SpO2: 94%    Vitals:   03/29/20 0659 03/29/20 0759 03/29/20 0900 03/29/20 1038  BP: (!) 157/59 (!) 145/68 (!) 133/119   Pulse: 80 77 82   Resp: 14 20 16    Temp:    98.3 F (36.8 C)  TempSrc:    Axillary  SpO2: 95% 96% 94%   Weight:      Height:        General: Pt is alert, awake, not in acute distress Cardiovascular: RRR, S1/S2 +, no rubs, no gallops Respiratory: CTA bilaterally, no wheezing, no rhonchi Abdominal: Soft, NT, ND, bowel sounds + Extremities: no edema, no cyanosis    The results of significant diagnostics from this hospitalization (including imaging, microbiology, ancillary and laboratory) are listed below for reference.     Microbiology: Recent Results (from the past 240 hour(s))  Respiratory Panel by RT PCR (Flu A&B, Covid) - Nasopharyngeal Swab     Status: None   Collection Time: 03/28/20  1:40 PM   Specimen: Nasopharyngeal Swab  Result Value Ref Range Status   SARS Coronavirus 2 by RT PCR NEGATIVE NEGATIVE Final    Comment: (NOTE) SARS-CoV-2 target nucleic acids are NOT DETECTED.  The SARS-CoV-2 RNA is generally detectable in upper respiratoy specimens during the acute phase of infection. The lowest concentration  of SARS-CoV-2 viral copies this assay can detect is 131 copies/mL. A negative result does not preclude SARS-Cov-2 infection and should not be used as the sole basis for treatment or other patient management decisions. A negative result may occur with  improper specimen collection/handling, submission of specimen other than nasopharyngeal swab, presence of viral mutation(s) within the areas targeted by this assay, and inadequate number of viral copies (<131 copies/mL). A negative result must be combined with clinical observations, patient history, and epidemiological information. The expected result is Negative.  Fact Sheet for Patients:  PinkCheek.be  Fact Sheet for Healthcare Providers:  GravelBags.it  This test is no t yet approved or cleared by the [place] FDA and  has been authorized for detection and/or diagnosis of SARS-CoV-2 by FDA under an Emergency Use Authorization (EUA). This EUA will remain  in effect (meaning this test can be used) for the duration of the COVID-19 declaration under Section 564(b)(1) of the Act, 21 U.S.C. section 360bbb-3(b)(1), unless the authorization is terminated or revoked sooner.     Influenza A by PCR NEGATIVE NEGATIVE Final   Influenza B by PCR NEGATIVE NEGATIVE Final    Comment: (NOTE) The Xpert Xpress SARS-CoV-2/FLU/RSV assay is intended as an aid in  the diagnosis of influenza from Nasopharyngeal swab specimens and  should not be used as a sole basis for treatment. Nasal washings and  aspirates are unacceptable for Xpert Xpress SARS-CoV-2/FLU/RSV  testing.  Fact Sheet for Patients: PinkCheek.be  Fact Sheet for Healthcare Providers: GravelBags.it  This test is not yet approved or cleared by the [place] FDA and  has been authorized for detection and/or diagnosis of SARS-CoV-2 by  FDA under an Emergency Use  Authorization (EUA). This EUA will remain  in effect (meaning this test can be used) for the duration of the  Covid-19 declaration under Section 564(b)(1) of the Act, 21  U.S.C. section 360bbb-3(b)(1), unless the authorization is  terminated or revoked. Performed at [facility], [address] [place], [place] [address]      Labs: BNP (last 3 results) No results for input(s): BNP in the last 8760 hours. Basic Metabolic Panel: Recent Labs  Lab 03/28/20 1156 03/29/20 0625  NA 133* 135  K 3.5 3.0*  CL 94* 95*  CO2 26 28  GLUCOSE 177* 64*  BUN 13 14  CREATININE 1.16* 0.91  CALCIUM 9.1 8.6*   Liver Function Tests: Recent Labs  Lab 03/28/20 1156  AST 22  ALT 20  ALKPHOS 56  BILITOT 0.6  PROT 6.5  ALBUMIN 3.5   Recent Labs  Lab 03/28/20 1156  LIPASE 18   No results for input(s): AMMONIA in the last 168 hours. CBC: Recent Labs  Lab 03/28/20 1156 03/29/20 0500  WBC 7.5 7.1  HGB 12.0 11.1*  HCT 37.0 33.5*  MCV 81.1 79.6*  PLT 227 233   Cardiac Enzymes: No results for input(s): CKTOTAL, CKMB, CKMBINDEX, TROPONINI in the last 168 hours. BNP: Invalid input(s): POCBNP CBG: Recent Labs  Lab 03/28/20 2142 03/28/20 2216 03/29/20 0458 03/29/20 0818 03/29/20 1233  GLUCAP 63* 89 68* 74 236*   D-Dimer No results for input(s): DDIMER in the last 72 hours. Hgb A1c Recent Labs    03/28/20 1156  HGBA1C 6.3*   Lipid Profile No results for input(s): CHOL, HDL, LDLCALC, TRIG, CHOLHDL, LDLDIRECT in the last 72 hours. Thyroid function studies No results for input(s): TSH, T4TOTAL, T3FREE, THYROIDAB in the last 72 hours.  Invalid input(s): FREET3 Anemia work up No results for input(s): VITAMINB12, FOLATE, FERRITIN, TIBC, IRON, RETICCTPCT in the last 72 hours. Urinalysis    Component Value Date/Time   COLORURINE YELLOW (A) 03/28/2020 1452   APPEARANCEUR CLEAR (A) 03/28/2020 1452   APPEARANCEUR Cloudy (A) 09/20/2017 1106   LABSPEC 1.019 03/28/2020  1452   LABSPEC 1.010 02/22/2012 1204   PHURINE 6.0 03/28/2020 1452   GLUCOSEU NEGATIVE 03/28/2020 1452   GLUCOSEU Negative 02/22/2012 1204   HGBUR NEGATIVE 03/28/2020 [place] 03/28/2020 1452   BILIRUBINUR Negative 09/20/2017 1106   BILIRUBINUR Negative 02/22/2012  [place] 03/28/2020 [place] 03/28/2020 1452   NITRITE POSITIVE (A) 03/28/2020 1452   LEUKOCYTESUR MODERATE (A) 03/28/2020 1452   LEUKOCYTESUR 2+ 02/22/2012 1204   Sepsis Labs Invalid input(s): PROCALCITONIN,  WBC,  LACTICIDVEN Microbiology Recent Results (from the past 240 hour(s))  Respiratory Panel by RT PCR (Flu A&B, Covid) - Nasopharyngeal Swab     Status: None   Collection Time: 03/28/20  1:40 PM   Specimen: Nasopharyngeal Swab  Result Value Ref Range Status   SARS Coronavirus 2 by RT PCR NEGATIVE NEGATIVE Final    Comment: (NOTE) SARS-CoV-2 target nucleic acids are NOT DETECTED.  The SARS-CoV-2 RNA is generally detectable in upper respiratoy specimens during the acute phase of infection. The lowest concentration of SARS-CoV-2 viral copies this assay can detect is 131 copies/mL. A negative result does not preclude SARS-Cov-2 infection and should not be used as the sole basis for treatment or other patient management decisions. A negative result may occur with  improper specimen collection/handling, submission of specimen other than nasopharyngeal swab, presence of viral mutation(s) within the areas targeted by this assay, and inadequate number of viral copies (<131 copies/mL). A negative result must be combined with clinical observations, patient history, and epidemiological information. The expected result is Negative.  Fact Sheet for Patients:  PinkCheek.be  Fact Sheet for Healthcare Providers:  GravelBags.it  This test is no t yet approved or cleared by the [place] FDA and  has been  authorized for detection and/or diagnosis of SARS-CoV-2 by FDA under an Emergency Use Authorization (EUA). This EUA will remain  in effect (meaning this test can be used) for the duration of the COVID-19 declaration under Section 564(b)(1) of the Act, 21 U.S.C. section 360bbb-3(b)(1), unless the authorization is terminated or revoked sooner.     Influenza A by PCR NEGATIVE NEGATIVE Final   Influenza B by PCR NEGATIVE NEGATIVE Final    Comment: (NOTE) The Xpert Xpress SARS-CoV-2/FLU/RSV assay is intended as an aid in  the diagnosis of influenza from Nasopharyngeal swab specimens and  should not be used as a sole basis for treatment. Nasal washings and  aspirates are unacceptable for Xpert Xpress SARS-CoV-2/FLU/RSV  testing.  Fact Sheet for Patients: PinkCheek.be  Fact Sheet for Healthcare Providers: GravelBags.it  This test is not yet approved or cleared by the [place] FDA and  has been authorized for detection and/or diagnosis of SARS-CoV-2 by  FDA under an Emergency Use Authorization (EUA). This EUA will remain  in effect (meaning this test can be used) for the duration of the  Covid-19 declaration under Section 564(b)(1) of the Act, 21  U.S.C. section 360bbb-3(b)(1), unless the authorization is  terminated or revoked. Performed at [facility], [address] [place], [place] [address]      Time coordinating discharge: Over 30 minutes  SIGNED:   Ezekiel Slocumb, DO [facility] 03/31/2020, 11:00 AM   If 7PM-7AM, please contact night-coverage [email or web address]

## 2020-03-29 NOTE — Progress Notes (Signed)
[facility]    SUBJECTIVE: The patient reports feeling well this morning without chest pain, shortness of breath, palpitations, dizziness, lightheadedness, syncope, abdominal pain, or diarrhea.   Vitals:   03/29/20 0639 03/29/20 0649 03/29/20 0659 03/29/20 0759  BP: (!) 155/63  (!) 157/59 (!) 145/68  Pulse: 79 79 80 77  Resp: 17 16 14 20   Temp:      TempSrc:      SpO2: 95% 97% 95% 96%  Weight:      Height:        No intake or output data in the 24 hours ending 03/29/20 0821    PHYSICAL EXAM  General: Well developed, well nourished, in no acute distress, sitting up in bed eating breakfast HEENT:  Normocephalic and atramatic Neck:  No JVD.  Lungs: normal effort of breathing on room air. Heart: HRRR .  Abdomen: no obvious distention Msk:  Back normal, gait not assessed.  Extremities: No clubbing, cyanosis or edema.   Neuro: Alert and oriented X 3. Psych:  Good affect, responds appropriately   LABS: Basic Metabolic Panel: Recent Labs    03/28/20 1156 03/29/20 0625  NA 133* 135  K 3.5 3.0*  CL 94* 95*  CO2 26 28  GLUCOSE 177* 64*  BUN 13 14  CREATININE 1.16* 0.91  CALCIUM 9.1 8.6*   Liver Function Tests: Recent Labs    03/28/20 1156  AST 22  ALT 20  ALKPHOS 56  BILITOT 0.6  PROT 6.5  ALBUMIN 3.5   Recent Labs    03/28/20 1156  LIPASE 18   CBC: Recent Labs    03/28/20 1156 03/29/20 0500  WBC 7.5 7.1  HGB 12.0 11.1*  HCT 37.0 33.5*  MCV 81.1 79.6*  PLT 227 233   Cardiac Enzymes: No results for input(s): CKTOTAL, CKMB, CKMBINDEX, TROPONINI in the last 72 hours. BNP: Invalid input(s): POCBNP D-Dimer: No results for input(s): DDIMER in the last 72 hours. Hemoglobin A1C: Recent Labs    03/28/20 1156  HGBA1C 6.3*   Fasting Lipid Panel: No results for input(s): CHOL, HDL, LDLCALC, TRIG, CHOLHDL, LDLDIRECT in the last 72 hours. Thyroid Function Tests: No results for input(s): TSH, T4TOTAL, T3FREE, THYROIDAB in the last 72  hours.  Invalid input(s): FREET3 Anemia Panel: No results for input(s): VITAMINB12, FOLATE, FERRITIN, TIBC, IRON, RETICCTPCT in the last 72 hours.  CT Head Wo Contrast  Result Date: 03/28/2020 CLINICAL DATA:  Syncope EXAM: CT HEAD WITHOUT CONTRAST TECHNIQUE: Contiguous axial images were obtained from the base of the skull through the vertex without intravenous contrast. COMPARISON:  October 01, 2015 FINDINGS: Brain: No evidence of acute infarction, hemorrhage, hydrocephalus, extra-axial collection or mass lesion/mass effect. Periventricular white matter hypodensities consistent with sequela of chronic microvascular ischemic disease. Vascular: No hyperdense vessel or unexpected calcification. Skull: Normal. Negative for fracture or focal lesion. Sinuses/Orbits: No acute finding. Other: None. IMPRESSION: No acute intracranial abnormality. Electronically Signed   By: Valentino Saxon MD   On: 03/28/2020 13:39   MR BRAIN WO CONTRAST  Result Date: 03/28/2020 CLINICAL DATA:  Stroke suspected.  Syncopal episode. EXAM: MRI HEAD WITHOUT CONTRAST TECHNIQUE: Multiplanar, multiecho pulse sequences of the brain and surrounding structures were obtained without intravenous contrast. COMPARISON:  CT head without contrast 03/28/2020 FINDINGS: Brain: No acute infarct, hemorrhage, or mass lesion is present. The ventricles are of normal size. Moderate periventricular and subcortical T2 hyperintensities are present bilaterally. Vascular: Flow is present in the major intracranial arteries. Skull and upper cervical spine: The craniocervical  junction is normal. Upper cervical spine is within normal limits. Marrow signal is unremarkable. Sinuses/Orbits: Fluid is present in the sphenoid sinuses bilaterally. The paranasal sinuses and mastoid air cells are otherwise clear. Bilateral lens replacements are noted. Globes and orbits are otherwise unremarkable. IMPRESSION: 1. No acute intracranial abnormality. 2. Moderate  periventricular and subcortical T2 hyperintensities bilaterally are moderately advanced for age. This likely reflects the sequela of chronic microvascular ischemia. 3. Bilateral sphenoid sinus disease. Electronically Signed   By: San Morelle M.D.   On: 03/28/2020 16:06   CT ABDOMEN PELVIS W CONTRAST  Result Date: 03/28/2020 CLINICAL DATA:  Epigastric pain EXAM: CT ABDOMEN AND PELVIS WITH CONTRAST TECHNIQUE: Multidetector CT imaging of the abdomen and pelvis was performed using the standard protocol following bolus administration of intravenous contrast. CONTRAST:  6mL OMNIPAQUE IOHEXOL 300 MG/ML  SOLN COMPARISON:  February 18, 2011. April 14, 2016 FINDINGS: Lower chest: RIGHT lower lobe pulmonary nodule measures 2 mm, unchanged since 2017 and thereby benign (series 2, image 7). Mild centrilobular nodularity at the RIGHT lung base. Three-vessel coronary artery atherosclerotic calcifications. Loop recorder. Atherosclerotic calcifications of the thoracic aorta. Hepatobiliary: Status post cholecystectomy. There is mild central biliary ductal dilation. Common bile duct measures 9 mm. Enhancing 7 mm mass of the LEFT liver (series 2, image 26). Pancreas: Atrophic pancreas.  No peripancreatic fat stranding. Spleen: Normal in size without focal abnormality.  Splenule. Adrenals/Urinary Tract: Adrenals are unremarkable. RIGHT-sided renal cysts. No hydronephrosis. Kidneys enhance symmetrically. Bladder is decompressed. Stomach/Bowel: Small hiatal hernia. The esophagus is partially fluid-filled. High density material within the stomach likely reflecting enteric debris. No evidence of bowel obstruction. There is a likely internal mesenteric defect within the RIGHT mid abdomen given swirling appearance of the mesentery at this location (series 5, image 30). It contains cecum and redundant sigmoid colon. The cecum is located in the RIGHT upper quadrant. Appendix is normal. There is a large colonic stool burden  throughout the RIGHT colon and transverse colon Vascular/Lymphatic: Severe atherosclerotic calcifications. No suspicious lymphadenopathy. Reproductive: Uterus and bilateral adnexa are unremarkable. Other: Nonobstructive small bowel containing LEFT inguinal hernia. Musculoskeletal: Remote RIGHT pelvic fracture. IMPRESSION: 1. Large colonic stool burden throughout the RIGHT colon and transverse colon. 2. There is a likely internal nonobstructed mesenteric defect within the RIGHT mid abdomen given swirling appearance of the mesentery at this location. It contains stool filled RIGHT colon and redundant sigmoid colon. No evidence of bowel obstruction. This may be contributing to patient constipation. 3. Nonobstructive small bowel containing LEFT inguinal hernia. 4. Enhancing 7 mm mass of the LEFT liver. In the absence of known malignancy, this likely represents a flash filling hemangioma. This could be further evaluated with dedicated three-phase CT or hepatic MRI if clinically indicated. 5. Mild central biliary ductal dilation, likely related to prior cholecystectomy. Correlate with liver function tests. 6. Mild centrilobular nodularity at the RIGHT lung base which is favored to reflect sequela of aspiration given fluid within the esophagus and small hiatal hernia. Aortic Atherosclerosis (ICD10-I70.0). Electronically Signed   By: Valentino Saxon MD   On: 03/28/2020 14:04     Echo pending  TELEMETRY: sinus rhyhtm  ASSESSMENT AND PLAN:  Principal Problem:   Syncope and collapse Active Problems:   Benign essential hypertension   Diabetes mellitus type 2, insulin dependent (HCC)   CAD (coronary artery disease)    1.  Syncope, probable vasovagal in nature, normal ECG, currently asymptomatic without chest pain or shortness of breath. Head CT negative. CT abdomen showed  large colonic stool burden.  2.  Coronary artery disease, status post stent LAD, status post CABG x3, currently without chest pain or  shortness of breath 3.  Essential hypertension, on carvedilol, losartan and hydrochlorothiazide  Recommendations: 1. Agree with current therapy 2. Staff from [facility] will place 1-week Holter on patient today; plan to follow-up with Dr. Ubaldo Glassing in approximately 2 weeks 3. Recommend deferring echocardiogram as outpatient 4. Patient may be discharged today from cardiovascular perspective.   Clabe Seal, PA-C 03/29/2020 8:21 AM  Discussed with Dr. Saralyn Pilar and plan made in collaboration with him.

## 2020-03-29 NOTE — Progress Notes (Signed)
Inpatient Diabetes Program Recommendations  AACE/ADA: New Consensus Statement on Inpatient Glycemic Control (2015)  Target Ranges:  Prepandial:   less than 140 mg/dL      Peak postprandial:   less than 180 mg/dL (1-2 hours)      Critically ill patients:  140 - 180 mg/dL   Lab Results  Component Value Date   GLUCAP 74 03/29/2020   HGBA1C 6.3 (H) 03/28/2020    Review of Glycemic Control Results for Brandi [name], Brandi [name] (MRN [id number]) as of 03/29/2020 11:41  Ref. Range 03/28/2020 18:28 03/28/2020 21:42 03/28/2020 22:16 03/29/2020 04:58 03/29/2020 08:18  Glucose-Capillary Latest Ref Range: 70 - 99 mg/dL 79 63 (L) 89 68 (L) 74   Diabetes history: DM2 Outpatient Diabetes medications: Lantus 12 units + Glucotrol 5 mg qd + Janumet XR 50-1gm bid Current orders for Inpatient glycemic control: Novolog moderate correction 0-15 tid  Inpatient Diabetes Program Recommendations:   I spoke with patient and son by phone. Patient's  doctor had decreased Glucotrol from bid to qd a few months ago. pt. sometimes only eats cheerios for breakfast so I requested to try to add in a protein to help keep BG steady. Sent secure update to Dr. Nicole Kindred. CBG Sunday morning prior to syncope episode was 115 @ home and patient ate only cheerios. Patient checks her CBGs regularly ?3 times daily and has not noticed change in weight, diet or CBGs. Will follow during hospitalization.  Thank you, Nani Gasser. Sevana Grandinetti, RN, MSN, CDE  Diabetes Coordinator Inpatient Glycemic Control Team Team Pager [phone] (8am-5pm) 03/29/2020 11:55 AM

## 2020-03-31 ENCOUNTER — Other Ambulatory Visit: Payer: Self-pay | Admitting: Internal Medicine

## 2020-03-31 DIAGNOSIS — Z1231 Encounter for screening mammogram for malignant neoplasm of breast: Secondary | ICD-10-CM

## 2020-03-31 NOTE — Hospital Course (Signed)
HPI on Admission, by Dr. Francine Graven: "Brandi [name] is a 84 y.o. female with medical history significant for hypertension, depression, diabetes mellitus, GERD and anxiety disorder who was brought into the ER by EMS after she had a witnessed syncopal episode while at church.  Patient was said to have been extremely diaphoretic during the event and per witnesses she was out for about 4 to 5 minutes.  Patient states that prior to passing out she felt lightheaded and was sitting down in the pew.  She has had syncopal episodes in the past. Per EMS they reported slight decreased sensation over her right side when compared to the left and were concerned about possible strokelike symptoms.  Patient is able to move all extremities...."  Review of systems was otherwise negative.  ED evaluation:  Notable Labs - sodium 133, potassium 3.5, chloride 94, glucose 177, creatinine 1.16, white count 7.5j. Imaging - noncontrast head CT negative for acute intracranial abnormalities.   EKG normal sinus rhythm.   Admitted to hospitalist service for observation.  Cardiology consulted.   Syncope and Collapse - Suspect vasovagal etiology.  No associated chest pain or SOB.  Echo within past year normal EF 50%.  Normal ECG. Monitored on telemetry with no significant events. Seen by cardiology.  Recommended for 1 week ambulatory heart monitor which was placed on patient morning of discharge. Follow up with Dr. Ubaldo Glassing in 2 weeks. Patient remained asymptomatic for the duration of her stay, and noted to be ambulating safely.  Clinically improved and stable for discharge.

## 2020-04-12 NOTE — Progress Notes (Signed)
04/13/2020 12:34 PM   Brandi [name] Jan 05, 1936 [id number]  Referring provider: No referring provider defined for this encounter.  Chief Complaint  Patient presents with  . Urinary Incontinence    HPI: 84 yo female with history of recurrent UTI's, incontinence, vaginal atrophy, cystocele and nocturia presents for follow up.    Recurrent UTI's Her risk factor for recurrent UTI's vaginal atrophy, incontinence and age.  She had a positive urine culture for pansensitive E. coli in September 2021, but she was not having symptoms at the time.  Her UA looked abnormal during a routine follow-up.  Vaginal atrophy She is not using the cream.     Incontinence She is experiencing urgency x 0-3 (improved), frequency x 4-7 (stable), incontinence x 0-3 (stable), nocturia x 0-3 (stable), she is engaging in toilet mapping, she is not limiting her fluid intake to avoid trips to the restroom.   Her BP is 96/57.  PVR is 132 mL.   She states she feels the Myrbetriq is helpful with controlling her urinary symptoms, but [facility] called her and said her insurance was not going to cover her Myrbetriq anymore.    Nocturia Patient is using her CPAP machine.  She is having nocturia x 0-3 (improved)     PMH: Past Medical History:  Diagnosis Date  . Anemia   . Anxiety   . Arthritis, degenerative   . Chronic cystitis   . Depression   . Diabetes mellitus without complication ([place])   . GERD (gastroesophageal reflux disease)   . Headache   . Heart disease   . HLD (hyperlipidemia)   . HTN (hypertension)   . Hypertension   . Incontinence in female   . Murmur   . Obesity   . Sleep apnea   . Urge incontinence of urine     Surgical History: Past Surgical History:  Procedure Laterality Date  . BREAST BIOPSY Right 2004   neg  . BUNIONECTOMY    . CARDIAC SURGERY     Stent  . CHOLECYSTECTOMY    . COLONOSCOPY WITH PROPOFOL N/A 06/14/2017   Procedure: COLONOSCOPY WITH PROPOFOL;  Surgeon:  Lollie Sails, MD;  Location: [facility];  Service: Endoscopy;  Laterality: N/A;  . JOINT REPLACEMENT     TKR Bilateral  . LEFT HEART CATH AND CORONARY ANGIOGRAPHY N/A 05/22/2018   Procedure: LEFT HEART CATH AND CORONARY ANGIOGRAPHY;  Surgeon: Corey Skains, MD;  Location: [facility];  Service: Cardiovascular;  Laterality: N/A;  . LOOP RECORDER INSERTION N/A 09/20/2016   Procedure: Loop Recorder Insertion;  Surgeon: Isaias Cowman, MD;  Location: [facility];  Service: Cardiovascular;  Laterality: N/A;  . SEPTOPLASTY      Home Medications:  Allergies as of 04/13/2020      Reactions   Accupril [quinapril Hcl] Rash   Micardis [telmisartan] Other (See Comments)   Dizziness   Quinapril Rash      Medication List       Accurate as of April 13, 2020 12:34 PM. If you have any questions, ask your nurse or doctor.        acetaminophen 325 MG tablet Commonly known as: TYLENOL Take 650 mg by mouth every 6 (six) hours as needed.   albuterol 108 (90 Base) MCG/ACT inhaler Commonly known as: VENTOLIN HFA Inhale 1-2 puffs into the lungs every 6 (six) hours as needed for wheezing or shortness of breath.   Altamist Spray 0.65 % nasal spray Generic drug: sodium chloride  Place into the nose.   aspirin 81 MG chewable tablet Chew 81 mg by mouth daily.   atorvastatin 40 MG tablet Commonly known as: LIPITOR Take 40 mg by mouth every evening.   budesonide-formoterol 80-4.5 MCG/ACT inhaler Commonly known as: SYMBICORT Inhale 2 puffs into the lungs 2 (two) times daily.   CALCIUM + D3 PO Take 1 tablet by mouth daily.   carvedilol 12.5 MG tablet Commonly known as: COREG Take 12.5 mg by mouth 2 (two) times daily.   famotidine 20 MG tablet Commonly known as: PEPCID Take 20 mg by mouth 2 (two) times daily.   furosemide 20 MG tablet Commonly known as: LASIX Take 20 mg by mouth 2 (two) times daily.   gabapentin 100 MG capsule Commonly known as:  NEURONTIN Take 200 mg by mouth 2 (two) times daily.   Gemtesa 75 MG Tabs Generic drug: Vibegron Take 75 mg by mouth daily.   glipiZIDE 5 MG tablet Commonly known as: GLUCOTROL Take 5 mg by mouth 2 (two) times daily.   hydrochlorothiazide 25 MG tablet Commonly known as: HYDRODIURIL Take 25 mg by mouth daily.   Janumet XR 50-1000 MG Tb24 Generic drug: SitaGLIPtin-MetFORMIN HCl Take 1 tablet by mouth 2 (two) times daily.   Lantus SoloStar 100 UNIT/ML Solostar Pen Generic drug: insulin glargine Inject 12 Units into the skin at bedtime.   losartan 100 MG tablet Commonly known as: COZAAR Take 100 mg by mouth daily.   nortriptyline 25 MG capsule Commonly known as: PAMELOR Take 25 mg by mouth at bedtime.   pantoprazole 20 MG tablet Commonly known as: PROTONIX Take 20 mg by mouth 2 (two) times daily.   prasugrel 10 MG Tabs tablet Commonly known as: EFFIENT Take 10 mg by mouth daily.   pregabalin 75 MG capsule Commonly known as: LYRICA Take 1 capsule (75 mg total) by mouth 2 (two) times daily.   PreserVision/Lutein Caps Take 1 capsule by mouth 2 (two) times daily.   Refresh Tears 0.5 % Soln Generic drug: carboxymethylcellulose Apply 1-2 drops to eye at bedtime.   senna-docusate 8.6-50 MG tablet Commonly known as: Senokot-S Take 1 tablet by mouth at bedtime as needed for mild constipation.   sucralfate 1 g tablet Commonly known as: CARAFATE Take 1 g by mouth 2 (two) times daily.   traZODone 50 MG tablet Commonly known as: DESYREL Take 1 tablet (50 mg total) by mouth at bedtime as needed for sleep.   venlafaxine 37.5 MG tablet Commonly known as: EFFEXOR Take 37.5 mg by mouth 2 (two) times daily.   Verapamil HCl CR 300 MG Cp24 Take 300 capsules by mouth daily.   VITAMIN B-12 PO Take 1 tablet by mouth daily.       Allergies:  Allergies  Allergen Reactions  . Accupril [Quinapril Hcl] Rash  . Micardis [Telmisartan] Other (See Comments)    Dizziness  .  Quinapril Rash    Family History: Family History  Problem Relation Age of Onset  . Breast cancer Cousin   . Skin cancer Other   . Diabetes Other   . Hypercholesterolemia Other   . Hypertension Other   . Bladder Cancer Maternal Aunt   . COPD Other   . Kidney disease Neg Hx   . Kidney cancer Neg Hx     Social History:  reports that she has never smoked. She has never used smokeless tobacco. She reports that she does not drink alcohol and does not use drugs.  ROS: For pertinent review of  systems please refer to history of present illness  Physical Exam: BP (!) 96/57   Pulse 65   Ht 5\' 1"  (1.549 m)   Wt 190 lb (86.2 kg)   BMI 35.90 kg/m   Patient was found unresponsive when entering the room.  She was breathing and had a pulse, but it was thready.  Code team was called and she was transferred to the ED.  She did become responsive once called team arrived.  Laboratory Data: Lab Results  Component Value Date   WBC 7.1 03/29/2020   HGB 11.1 (L) 03/29/2020   HCT 33.5 (L) 03/29/2020   MCV 79.6 (L) 03/29/2020   PLT 233 03/29/2020   Lab Results  Component Value Date   CREATININE 0.91 03/29/2020   Lab Results  Component Value Date   HGBA1C 6.3 (H) 03/28/2020      Component Value Date/Time   CHOL 141 06/30/2011 0551   HDL 63 (H) 06/30/2011 0551   VLDL 14 06/30/2011 0551   LDLCALC 64 06/30/2011 0551   Lab Results  Component Value Date   AST 22 03/28/2020   Lab Results  Component Value Date   ALT 20 03/28/2020   Specimen:  Urine  Ref Range & Units 13 d ago  Color Yellow  Yellow      Clarity Clear  SL CloudyAbnormal      Specific Gravity 1.000 - 1.030  <=1.005      pH, Urine 5.0 - 8.0  7.0      Protein, Urinalysis Negative, Trace mg/dL Negative      Glucose, Urinalysis Negative mg/dL Negative      Ketones, Urinalysis Negative mg/dL Negative      Blood, Urinalysis Negative  Negative      Nitrite, Urinalysis Negative  PositiveAbnormal        Leukocyte Esterase, Urinalysis Negative  LargeAbnormal      White Blood Cells, Urinalysis None Seen, 0-3 /hpf 10-50Abnormal      Red Blood Cells, Urinalysis None Seen, 0-3 /hpf 0-3      Bacteria, Urinalysis None Seen /hpf ManyAbnormal      Squamous Epithelial Cells, Urinalysis Rare, Few, None Seen /hpf Rare      Resulting Agency  [facility]  Specimen Collected: 03/10/20 11:54 AM Last Resulted: 03/10/20 1:42 PM  Received From: [facility]  Result Received: 03/12/20 2:13 PM    I have reviewed the labs.  Pertinent Imaging Results for ELSBETH, YEARICK (MRN [id number]) as of 04/13/2020 12:01  Ref. Range 04/13/2020 11:28  Scan Result Unknown 132    Assessment & Plan:    1. Unresponsiveness Taken to the ED for further work-up   Return for to the ED.  These notes generated with voice recognition software. I apologize for typographical errors.  Zara Council, PA-C  [facility] [address] [place], [place] [address] [phone]

## 2020-04-13 ENCOUNTER — Emergency Department: Payer: Medicare PPO

## 2020-04-13 ENCOUNTER — Encounter: Payer: Self-pay | Admitting: Urology

## 2020-04-13 ENCOUNTER — Other Ambulatory Visit: Payer: Self-pay

## 2020-04-13 ENCOUNTER — Inpatient Hospital Stay
Admission: EM | Admit: 2020-04-13 | Discharge: 2020-04-15 | DRG: 312 | Disposition: A | Discharge status: Home / Self Care | Payer: Medicare PPO | Source: Ambulatory Visit | Attending: Internal Medicine | Admitting: Internal Medicine

## 2020-04-13 ENCOUNTER — Ambulatory Visit: Payer: Medicare PPO | Admitting: Urology

## 2020-04-13 VITALS — BP 96/57 | HR 65 | Ht 61.0 in | Wt 190.0 lb

## 2020-04-13 DIAGNOSIS — E861 Hypovolemia: Secondary | ICD-10-CM | POA: Diagnosis present

## 2020-04-13 DIAGNOSIS — R197 Diarrhea, unspecified: Secondary | ICD-10-CM | POA: Diagnosis present

## 2020-04-13 DIAGNOSIS — Z803 Family history of malignant neoplasm of breast: Secondary | ICD-10-CM

## 2020-04-13 DIAGNOSIS — E785 Hyperlipidemia, unspecified: Secondary | ICD-10-CM | POA: Diagnosis present

## 2020-04-13 DIAGNOSIS — R404 Transient alteration of awareness: Secondary | ICD-10-CM

## 2020-04-13 DIAGNOSIS — F32A Depression, unspecified: Secondary | ICD-10-CM | POA: Diagnosis present

## 2020-04-13 DIAGNOSIS — N390 Urinary tract infection, site not specified: Secondary | ICD-10-CM

## 2020-04-13 DIAGNOSIS — E1142 Type 2 diabetes mellitus with diabetic polyneuropathy: Secondary | ICD-10-CM | POA: Diagnosis present

## 2020-04-13 DIAGNOSIS — R4189 Other symptoms and signs involving cognitive functions and awareness: Secondary | ICD-10-CM | POA: Diagnosis not present

## 2020-04-13 DIAGNOSIS — Z888 Allergy status to other drugs, medicaments and biological substances status: Secondary | ICD-10-CM

## 2020-04-13 DIAGNOSIS — Z6836 Body mass index (BMI) 36.0-36.9, adult: Secondary | ICD-10-CM

## 2020-04-13 DIAGNOSIS — R300 Dysuria: Secondary | ICD-10-CM | POA: Diagnosis present

## 2020-04-13 DIAGNOSIS — Z794 Long term (current) use of insulin: Secondary | ICD-10-CM | POA: Diagnosis not present

## 2020-04-13 DIAGNOSIS — Z825 Family history of asthma and other chronic lower respiratory diseases: Secondary | ICD-10-CM

## 2020-04-13 DIAGNOSIS — I10 Essential (primary) hypertension: Secondary | ICD-10-CM | POA: Diagnosis present

## 2020-04-13 DIAGNOSIS — R55 Syncope and collapse: Secondary | ICD-10-CM | POA: Diagnosis present

## 2020-04-13 DIAGNOSIS — N3946 Mixed incontinence: Secondary | ICD-10-CM

## 2020-04-13 DIAGNOSIS — N3941 Urge incontinence: Secondary | ICD-10-CM | POA: Diagnosis present

## 2020-04-13 DIAGNOSIS — E872 Acidosis, unspecified: Secondary | ICD-10-CM

## 2020-04-13 DIAGNOSIS — N179 Acute kidney failure, unspecified: Secondary | ICD-10-CM | POA: Diagnosis present

## 2020-04-13 DIAGNOSIS — I951 Orthostatic hypotension: Principal | ICD-10-CM | POA: Diagnosis present

## 2020-04-13 DIAGNOSIS — I2510 Atherosclerotic heart disease of native coronary artery without angina pectoris: Secondary | ICD-10-CM | POA: Diagnosis present

## 2020-04-13 DIAGNOSIS — K219 Gastro-esophageal reflux disease without esophagitis: Secondary | ICD-10-CM | POA: Diagnosis present

## 2020-04-13 DIAGNOSIS — R109 Unspecified abdominal pain: Secondary | ICD-10-CM | POA: Diagnosis present

## 2020-04-13 DIAGNOSIS — Z96653 Presence of artificial knee joint, bilateral: Secondary | ICD-10-CM | POA: Diagnosis present

## 2020-04-13 DIAGNOSIS — Z20822 Contact with and (suspected) exposure to covid-19: Secondary | ICD-10-CM | POA: Diagnosis present

## 2020-04-13 DIAGNOSIS — E669 Obesity, unspecified: Secondary | ICD-10-CM | POA: Diagnosis present

## 2020-04-13 DIAGNOSIS — Z7982 Long term (current) use of aspirin: Secondary | ICD-10-CM

## 2020-04-13 DIAGNOSIS — Z79899 Other long term (current) drug therapy: Secondary | ICD-10-CM

## 2020-04-13 DIAGNOSIS — N3020 Other chronic cystitis without hematuria: Secondary | ICD-10-CM | POA: Diagnosis present

## 2020-04-13 DIAGNOSIS — R739 Hyperglycemia, unspecified: Secondary | ICD-10-CM

## 2020-04-13 DIAGNOSIS — F419 Anxiety disorder, unspecified: Secondary | ICD-10-CM | POA: Diagnosis present

## 2020-04-13 DIAGNOSIS — R112 Nausea with vomiting, unspecified: Secondary | ICD-10-CM | POA: Diagnosis present

## 2020-04-13 DIAGNOSIS — E119 Type 2 diabetes mellitus without complications: Secondary | ICD-10-CM

## 2020-04-13 DIAGNOSIS — E1165 Type 2 diabetes mellitus with hyperglycemia: Secondary | ICD-10-CM | POA: Diagnosis present

## 2020-04-13 DIAGNOSIS — G4733 Obstructive sleep apnea (adult) (pediatric): Secondary | ICD-10-CM | POA: Diagnosis present

## 2020-04-13 DIAGNOSIS — Z808 Family history of malignant neoplasm of other organs or systems: Secondary | ICD-10-CM

## 2020-04-13 DIAGNOSIS — Z833 Family history of diabetes mellitus: Secondary | ICD-10-CM

## 2020-04-13 DIAGNOSIS — D649 Anemia, unspecified: Secondary | ICD-10-CM | POA: Diagnosis present

## 2020-04-13 DIAGNOSIS — Z7951 Long term (current) use of inhaled steroids: Secondary | ICD-10-CM

## 2020-04-13 DIAGNOSIS — T730XXA Starvation, initial encounter: Secondary | ICD-10-CM | POA: Diagnosis present

## 2020-04-13 DIAGNOSIS — E860 Dehydration: Secondary | ICD-10-CM | POA: Diagnosis present

## 2020-04-13 DIAGNOSIS — Z8052 Family history of malignant neoplasm of bladder: Secondary | ICD-10-CM

## 2020-04-13 DIAGNOSIS — Z8249 Family history of ischemic heart disease and other diseases of the circulatory system: Secondary | ICD-10-CM

## 2020-04-13 DIAGNOSIS — E871 Hypo-osmolality and hyponatremia: Secondary | ICD-10-CM | POA: Diagnosis present

## 2020-04-13 DIAGNOSIS — I7090 Unspecified atherosclerosis: Secondary | ICD-10-CM

## 2020-04-13 DIAGNOSIS — R519 Headache, unspecified: Secondary | ICD-10-CM | POA: Diagnosis present

## 2020-04-13 DIAGNOSIS — M1990 Unspecified osteoarthritis, unspecified site: Secondary | ICD-10-CM | POA: Diagnosis present

## 2020-04-13 LAB — GASTROINTESTINAL PANEL BY PCR, STOOL (REPLACES STOOL CULTURE)

## 2020-04-13 LAB — COMPREHENSIVE METABOLIC PANEL
ALT: 20 U/L (ref 0–44)
AST: 23 U/L (ref 15–41)
Albumin: 3.5 g/dL (ref 3.5–5.0)
Alkaline Phosphatase: 49 U/L (ref 38–126)
Anion gap: 12 (ref 5–15)
BUN: 22 mg/dL (ref 8–23)
CO2: 23 mmol/L (ref 22–32)
Calcium: 9 mg/dL (ref 8.9–10.3)
Chloride: 93 mmol/L — ABNORMAL LOW (ref 98–111)
Creatinine, Ser: 1.27 mg/dL — ABNORMAL HIGH (ref 0.44–1.00)
GFR, Estimated: 42 mL/min — ABNORMAL LOW (ref >60)
Glucose, Bld: 172 mg/dL — ABNORMAL HIGH (ref 70–99)
Potassium: 3.7 mmol/L (ref 3.5–5.1)
Sodium: 128 mmol/L — ABNORMAL LOW (ref 135–145)
Total Bilirubin: 0.6 mg/dL (ref 0.3–1.2)
Total Protein: 6.7 g/dL (ref 6.5–8.1)

## 2020-04-13 LAB — URINALYSIS, COMPLETE (UACMP) WITH MICROSCOPIC
Bilirubin Urine: NEGATIVE
Glucose, UA: NEGATIVE mg/dL
Hgb urine dipstick: NEGATIVE
Ketones, ur: NEGATIVE mg/dL
Leukocytes,Ua: NEGATIVE
Nitrite: POSITIVE — AB
Protein, ur: NEGATIVE mg/dL
Specific Gravity, Urine: 1.011 (ref 1.005–1.030)
pH: 6 (ref 5.0–8.0)

## 2020-04-13 LAB — CBC WITH DIFFERENTIAL/PLATELET
Abs Immature Granulocytes: 0.04 10*3/uL (ref 0.00–0.07)
Basophils Absolute: 0 10*3/uL (ref 0.0–0.1)
Basophils Relative: 1 %
Eosinophils Absolute: 0.3 10*3/uL (ref 0.0–0.5)
Eosinophils Relative: 4 %
HCT: 39 % (ref 36.0–46.0)
Hemoglobin: 12.4 g/dL (ref 12.0–15.0)
Immature Granulocytes: 1 %
Lymphocytes Relative: 25 %
Lymphs Abs: 2 10*3/uL (ref 0.7–4.0)
MCH: 25.7 pg — ABNORMAL LOW (ref 26.0–34.0)
MCHC: 31.8 g/dL (ref 30.0–36.0)
MCV: 80.9 fL (ref 80.0–100.0)
Monocytes Absolute: 0.7 10*3/uL (ref 0.1–1.0)
Monocytes Relative: 8 %
Neutro Abs: 5.1 10*3/uL (ref 1.7–7.7)
Neutrophils Relative %: 61 %
Platelets: 275 10*3/uL (ref 150–400)
RBC: 4.82 MIL/uL (ref 3.87–5.11)
RDW: 14.6 % (ref 11.5–15.5)
WBC: 8.2 10*3/uL (ref 4.0–10.5)
nRBC: 0 % (ref 0.0–0.2)

## 2020-04-13 LAB — C DIFFICILE QUICK SCREEN W PCR REFLEX
C Diff antigen: NEGATIVE
C Diff interpretation: NOT DETECTED
C Diff toxin: NEGATIVE

## 2020-04-13 LAB — RESPIRATORY PANEL BY RT PCR (FLU A&B, COVID)
Influenza A by PCR: NEGATIVE
Influenza B by PCR: NEGATIVE
SARS Coronavirus 2 by RT PCR: NEGATIVE

## 2020-04-13 LAB — OSMOLALITY: Osmolality: 283 mOsm/kg (ref 275–295)

## 2020-04-13 LAB — LACTOFERRIN, FECAL, QUALITATIVE: Lactoferrin, Fecal, Qual: POSITIVE — AB

## 2020-04-13 LAB — TROPONIN I (HIGH SENSITIVITY)
Troponin I (High Sensitivity): 5 ng/L (ref <18)
Troponin I (High Sensitivity): 5 ng/L (ref <18)

## 2020-04-13 LAB — GLUCOSE, CAPILLARY
Glucose-Capillary: 171 mg/dL — ABNORMAL HIGH (ref 70–99)
Glucose-Capillary: 120 mg/dL — ABNORMAL HIGH (ref 70–99)
Glucose-Capillary: 141 mg/dL — ABNORMAL HIGH (ref 70–99)

## 2020-04-13 LAB — LACTIC ACID, PLASMA
Lactic Acid, Venous: 3.6 mmol/L (ref 0.5–1.9)
Lactic Acid, Venous: 2.8 mmol/L (ref 0.5–1.9)
Lactic Acid, Venous: 2.9 mmol/L (ref 0.5–1.9)

## 2020-04-13 LAB — SODIUM, URINE, RANDOM: Sodium, Ur: 65 mmol/L

## 2020-04-13 LAB — MAGNESIUM: Magnesium: 2 mg/dL (ref 1.7–2.4)

## 2020-04-13 LAB — OSMOLALITY, URINE: Osmolality, Ur: 276 mOsm/kg — ABNORMAL LOW (ref 300–900)

## 2020-04-13 LAB — T4, FREE: Free T4: 0.68 ng/dL (ref 0.61–1.12)

## 2020-04-13 LAB — BLADDER SCAN AMB NON-IMAGING: Scan Result: 132

## 2020-04-13 LAB — TSH: TSH: 14.535 u[IU]/mL — ABNORMAL HIGH (ref 0.350–4.500)

## 2020-04-13 LAB — LIPASE, BLOOD: Lipase: 20 U/L (ref 11–51)

## 2020-04-13 LAB — CREATININE, URINE, RANDOM: Creatinine, Urine: 29 mg/dL

## 2020-04-13 MED ORDER — ONDANSETRON HCL 4 MG/2ML IJ SOLN: 4.0000 mg | Freq: Four times a day (QID) | INTRAMUSCULAR | Status: DC | PRN

## 2020-04-13 MED ORDER — ACETAMINOPHEN 325 MG PO TABS
650.0000 mg | ORAL_TABLET | Freq: Four times a day (QID) | ORAL | Status: DC | PRN
  Administered 2020-04-15: 650 mg via ORAL
  Filled 2020-04-13: qty 2

## 2020-04-13 MED ORDER — IOHEXOL 300 MG/ML  SOLN
75.0000 mL | Freq: Once | INTRAMUSCULAR | Status: AC | PRN
  Administered 2020-04-13: 75 mL via INTRAVENOUS

## 2020-04-13 MED ORDER — CARBOXYMETHYLCELLULOSE SODIUM 0.5 % OP SOLN: 1.0000 [drp] | Freq: Every day | OPHTHALMIC | Status: DC

## 2020-04-13 MED ORDER — GABAPENTIN 100 MG PO CAPS
200.0000 mg | ORAL_CAPSULE | Freq: Two times a day (BID) | ORAL | Status: DC
  Administered 2020-04-13 – 2020-04-15 (×4): 200 mg via ORAL
  Filled 2020-04-13 (×4): qty 2

## 2020-04-13 MED ORDER — SODIUM CHLORIDE 0.9 % IV SOLN
1.0000 g | Freq: Once | INTRAVENOUS | Status: AC
  Administered 2020-04-13: 1 g via INTRAVENOUS
  Filled 2020-04-13: qty 10

## 2020-04-13 MED ORDER — MELATONIN 5 MG PO TABS: 2.5000 mg | ORAL_TABLET | Freq: Every evening | ORAL | Status: DC | PRN

## 2020-04-13 MED ORDER — ONDANSETRON HCL 4 MG/2ML IJ SOLN
4.0000 mg | Freq: Once | INTRAMUSCULAR | Status: AC
  Administered 2020-04-13: 4 mg via INTRAVENOUS
  Filled 2020-04-13: qty 2

## 2020-04-13 MED ORDER — ALBUTEROL SULFATE HFA 108 (90 BASE) MCG/ACT IN AERS
1.0000 | INHALATION_SPRAY | Freq: Four times a day (QID) | RESPIRATORY_TRACT | Status: DC | PRN
  Filled 2020-04-13: qty 6.7

## 2020-04-13 MED ORDER — FAMOTIDINE 20 MG PO TABS
20.0000 mg | ORAL_TABLET | Freq: Two times a day (BID) | ORAL | Status: DC
  Administered 2020-04-13 – 2020-04-15 (×4): 20 mg via ORAL
  Filled 2020-04-13 (×4): qty 1

## 2020-04-13 MED ORDER — ATORVASTATIN CALCIUM 20 MG PO TABS
40.0000 mg | ORAL_TABLET | Freq: Every evening | ORAL | Status: DC
  Administered 2020-04-13 – 2020-04-14 (×2): 40 mg via ORAL
  Filled 2020-04-13 (×2): qty 2

## 2020-04-13 MED ORDER — POLYVINYL ALCOHOL 1.4 % OP SOLN
1.0000 [drp] | OPHTHALMIC | Status: DC | PRN
  Filled 2020-04-13: qty 15

## 2020-04-13 MED ORDER — TRAZODONE HCL 50 MG PO TABS: 50.0000 mg | ORAL_TABLET | Freq: Every evening | ORAL | Status: DC | PRN

## 2020-04-13 MED ORDER — ACETAMINOPHEN 325 MG PO TABS: 650.0000 mg | ORAL_TABLET | Freq: Four times a day (QID) | ORAL | Status: DC | PRN

## 2020-04-13 MED ORDER — SODIUM CHLORIDE 0.9 % IV SOLN
1.0000 g | INTRAVENOUS | Status: DC
  Administered 2020-04-14: 1 g via INTRAVENOUS
  Filled 2020-04-13 (×2): qty 10

## 2020-04-13 MED ORDER — ONDANSETRON HCL 4 MG PO TABS: 4.0000 mg | ORAL_TABLET | Freq: Four times a day (QID) | ORAL | Status: DC | PRN

## 2020-04-13 MED ORDER — LACTATED RINGERS IV BOLUS
500.0000 mL | Freq: Once | INTRAVENOUS | Status: AC
  Administered 2020-04-13: 500 mL via INTRAVENOUS

## 2020-04-13 MED ORDER — INSULIN ASPART 100 UNIT/ML ~~LOC~~ SOLN
0.0000 [IU] | Freq: Three times a day (TID) | SUBCUTANEOUS | Status: DC
  Administered 2020-04-14 – 2020-04-15 (×3): 2 [IU] via SUBCUTANEOUS
  Filled 2020-04-13 (×3): qty 1

## 2020-04-13 MED ORDER — LACTATED RINGERS IV BOLUS
1000.0000 mL | Freq: Once | INTRAVENOUS | Status: AC
  Administered 2020-04-13: 1000 mL via INTRAVENOUS

## 2020-04-13 MED ORDER — LACTATED RINGERS IV SOLN: INTRAVENOUS | Status: DC

## 2020-04-13 MED ORDER — ASPIRIN 81 MG PO CHEW
81.0000 mg | CHEWABLE_TABLET | Freq: Every day | ORAL | Status: DC
  Administered 2020-04-14 – 2020-04-15 (×2): 81 mg via ORAL
  Filled 2020-04-13 (×2): qty 1

## 2020-04-13 MED ORDER — VENLAFAXINE HCL 37.5 MG PO TABS
37.5000 mg | ORAL_TABLET | Freq: Two times a day (BID) | ORAL | Status: DC
  Administered 2020-04-13 – 2020-04-14 (×3): 37.5 mg via ORAL
  Filled 2020-04-13 (×5): qty 1

## 2020-04-13 MED ORDER — ACETAMINOPHEN 650 MG RE SUPP: 650.0000 mg | Freq: Four times a day (QID) | RECTAL | Status: DC | PRN

## 2020-04-13 MED ORDER — MOMETASONE FURO-FORMOTEROL FUM 100-5 MCG/ACT IN AERO
2.0000 | INHALATION_SPRAY | Freq: Two times a day (BID) | RESPIRATORY_TRACT | Status: DC
  Administered 2020-04-13 – 2020-04-15 (×4): 2 via RESPIRATORY_TRACT
  Filled 2020-04-13: qty 8.8

## 2020-04-13 NOTE — Progress Notes (Signed)
  Chaplain On-Call responded to Code [employer] for the [id number] in the [employer]. Received update from the Office Staff that the patient was being moved to the ED-room 2 following an episode of syncope in their office.  Chaplain arrived in the ED and met the patient's son, Rosaria Ferries, as the patient is being stabilized. Chaplain returned the patient's walking cane to her son, having been given the cane by the Urology Staff.  Chaplain provided spiritual support and assured patient's son of further availability as needed.  [place] Sonja Manseau M.Div., [facility]

## 2020-04-13 NOTE — Plan of Care (Signed)

## 2020-04-13 NOTE — H&P (Signed)
History and Physical    PLEASE NOTE THAT DRAGON DICTATION SOFTWARE WAS USED IN THE CONSTRUCTION OF THIS NOTE.   Brandi [name] EML:[id number] DOB: 05-13-1936 DOA: 04/13/2020  PCP: Baxter Hire, MD Patient coming from: urology clinic  I have personally briefly reviewed patient's old medical records in [place]  Chief Complaint: Syncope  HPI: Brandi [name] is a 84 y.o. female with medical history significant for type 2 diabetes mellitus associated with peripheral polyneuropathy, orthostatic hypotension, urge incontinence, who is admitted to [facility] on 04/13/2020 with acute kidney injury after presenting directly from outpatient neurology clinic to [facility] Emergency Department for evaluation of syncopal event that occurred while at her urology appointment.   While at urology clinic earlier today for routine follow-up for management of underlying urge incontinence, the patient reportedly experienced a syncopal event when she attempted to rise to a standing position the evaluation table.  The patient denies any associated or preceding chest pain, shortness of breath, palpitations, nausea, vomiting.  This event was witnessed, and reportedly the patient did not hit her head as a component of the fall.  She regained consciousness within a few seconds, and was subsequently brought to emergency department for further evaluation and management of this syncopal event.  Per chart review, it appears that the patient has a history of prior syncopal events, as well as a documented history of orthostatic hypotension.  She reports intermittent nausea resulting in 1 episode of nonbloody, nonbilious emesis yesterday, as well as mild abdominal pain involving the bilateral lower abdominal quadrants.  She notes that this is been associated with 2-3 episodes of diarrhea over the last day in the absence of any associated melena or hematochezia.  She denies any associated  subjective fever, chills, rigors, or generalized myalgias.  Denies any recent traveling or known sick contacts.  Overall, over the last 1 to 2 days, in the setting of intermittent nausea, the patient acknowledges diminished oral intake of food and fluids, but reports that she has continued to take her outpatient antihypertensive and diuretic regimen, which consists of Coreg, Lasix, HCTZ, losartan, and verapamil.   The patient has a documented history of chronic cystitis, and kidneys that she has been experiencing some mild dysuria over the last 3 to 4 days in the absence of any associated gross hematuria or any change in urinary urgency/frequency.  Denies any associated flank discomfort.    ED Course:  Vital signs in the ED were notable for the following: Temperature max 98.1; heart rate 6774 initial blood pressure noted to be 101/58, which increased to 128/53 following administration of IV fluids, as further described below; respiratory rate 14-18; oxygen saturation 96% on room air.  Labs were notable for the following: CMP notable for the following: Sodium 128, potassium 3.7, chloride 90 BUN 22, creatinine 1.27 relative to most recent prior creatinine data point of 0.91 when checked on 03/29/2020.  High-sensitivity troponin I x2 were both found to be nonelevated at 5.  Initial lactic acid noted to be 3.6, with repeat value trending down to 2.8 following interval IV fluids.  CBC notable for white cell count of 8200 and hemoglobin 12.4.  Urinalysis showed 6-10 white blood cells, many bacteria, no squamous epithelial cells, nitrate positive, and was positive for hyaline cast.  Screening COVID-19 PCR as well as influenza PCR was checked in the ED today evaluate negative.  Blood cultures x2 collected prior to initiation of any antibiotics in the ED. in the  context of recent loose stool, stool studies in the form of C. difficile PCR, stool lactoferrin, and ova/parasites were initiated, with results of such  currently pending.  Chest x-ray showed no evidence of cardiopulmonary process.  In order to further evaluate mild abdominal discomfort, CT abdomen/pelvis was obtained, and showed no evidence of acute intra-abdominal process.  EKG showed sinus rhythm with heart rate 74, QTc 444 ms, and no evidence of T wave or ST changes.  While in the ED, the following were administered: Rocephin 1 g IV x1.  Lactated Ringer's x2.5 L bolus.  Subsequently, the patient was admitted to the med telemetry floor for further evaluation and management of presenting acute kidney injury in the context of dehydration with associated acute hyponatremia.    Review of Systems: As per HPI otherwise 10 point review of systems negative.   Past Medical History:  Diagnosis Date  . Anemia   . Anxiety   . Arthritis, degenerative   . Chronic cystitis   . Depression   . Diabetes mellitus without complication ([place])   . GERD (gastroesophageal reflux disease)   . Headache   . Heart disease   . HLD (hyperlipidemia)   . HTN (hypertension)   . Hypertension   . Incontinence in female   . Murmur   . Obesity   . Sleep apnea   . Urge incontinence of urine     Past Surgical History:  Procedure Laterality Date  . BREAST BIOPSY Right 2004   neg  . BUNIONECTOMY    . CARDIAC SURGERY     Stent  . CHOLECYSTECTOMY    . COLONOSCOPY WITH PROPOFOL N/A 06/14/2017   Procedure: COLONOSCOPY WITH PROPOFOL;  Surgeon: Lollie Sails, MD;  Location: [facility] ENDOSCOPY;  Service: Endoscopy;  Laterality: N/A;  . JOINT REPLACEMENT     TKR Bilateral  . LEFT HEART CATH AND CORONARY ANGIOGRAPHY N/A 05/22/2018   Procedure: LEFT HEART CATH AND CORONARY ANGIOGRAPHY;  Surgeon: Corey Skains, MD;  Location: [place] CV LAB;  Service: Cardiovascular;  Laterality: N/A;  . LOOP RECORDER INSERTION N/A 09/20/2016   Procedure: Loop Recorder Insertion;  Surgeon: Isaias Cowman, MD;  Location: [place] CV LAB;  Service: Cardiovascular;   Laterality: N/A;  . SEPTOPLASTY      Social History:  reports that she has never smoked. She has never used smokeless tobacco. She reports that she does not drink alcohol and does not use drugs.   Allergies  Allergen Reactions  . Accupril [Quinapril Hcl] Rash  . Micardis [Telmisartan] Other (See Comments)    Dizziness  . Quinapril Rash    Family History  Problem Relation Age of Onset  . Breast cancer Cousin   . Skin cancer Other   . Diabetes Other   . Hypercholesterolemia Other   . Hypertension Other   . Bladder Cancer Maternal Aunt   . COPD Other   . Kidney disease Neg Hx   . Kidney cancer Neg Hx      Prior to Admission medications   Medication Sig Start Date End Date Taking? Authorizing Provider  acetaminophen (TYLENOL) 325 MG tablet Take 650 mg by mouth every 6 (six) hours as needed.    [provider]  albuterol (VENTOLIN HFA) 108 (90 Base) MCG/ACT inhaler Inhale 1-2 puffs into the lungs every 6 (six) hours as needed for wheezing or shortness of breath.    [provider]  aspirin 81 MG chewable tablet Chew 81 mg by mouth daily.  [provider]  atorvastatin (LIPITOR) 40 MG tablet Take 40 mg by mouth every evening. 08/05/19   [provider]  budesonide-formoterol (SYMBICORT) 80-4.5 MCG/ACT inhaler Inhale 2 puffs into the lungs 2 (two) times daily.    [provider]  Calcium Carb-Cholecalciferol (CALCIUM + D3 PO) Take 1 tablet by mouth daily.    [provider]  carboxymethylcellulose (REFRESH TEARS) 0.5 % SOLN Apply 1-2 drops to eye at bedtime.     [provider]  carvedilol (COREG) 12.5 MG tablet Take 12.5 mg by mouth 2 (two) times daily. 02/07/20   [provider]  Cyanocobalamin (VITAMIN B-12 PO) Take 1 tablet by mouth daily.    [provider]  famotidine (PEPCID) 20 MG tablet Take 20 mg by mouth 2 (two) times daily.    [provider]  furosemide (LASIX) 20 MG tablet Take  20 mg by mouth 2 (two) times daily. 01/08/20   [provider]  gabapentin (NEURONTIN) 100 MG capsule Take 200 mg by mouth 2 (two) times daily. 01/21/20   [provider]  glipiZIDE (GLUCOTROL) 5 MG tablet Take 5 mg by mouth 2 (two) times daily.  08/19/13   [provider]  hydrochlorothiazide (HYDRODIURIL) 25 MG tablet Take 25 mg by mouth daily. 02/14/20   [provider]  Insulin Glargine (LANTUS SOLOSTAR) 100 UNIT/ML Solostar Pen Inject 12 Units into the skin at bedtime.  07/20/15   [provider]  JANUMET XR 50-1000 MG TB24 Take 1 tablet by mouth 2 (two) times daily. 10/02/18   [provider]  losartan (COZAAR) 100 MG tablet Take 100 mg by mouth daily.    [provider]  Multiple Vitamins-Minerals (PRESERVISION/LUTEIN) CAPS Take 1 capsule by mouth 2 (two) times daily. 03/29/20   Ezekiel Slocumb, DO  nortriptyline (PAMELOR) 25 MG capsule Take 25 mg by mouth at bedtime. 03/13/20   [provider]  pantoprazole (PROTONIX) 20 MG tablet Take 20 mg by mouth 2 (two) times daily. 02/20/20   [provider]  prasugrel (EFFIENT) 10 MG TABS tablet Take 10 mg by mouth daily.     [provider]  pregabalin (LYRICA) 75 MG capsule Take 1 capsule (75 mg total) by mouth 2 (two) times daily. 03/29/20   Ezekiel Slocumb, DO  senna-docusate (SENOKOT-S) 8.6-50 MG tablet Take 1 tablet by mouth at bedtime as needed for mild constipation. 03/29/20   Ezekiel Slocumb, DO  sodium chloride (ALTAMIST SPRAY) 0.65 % nasal spray Place into the nose.     [provider]  sucralfate (CARAFATE) 1 g tablet Take 1 g by mouth 2 (two) times daily.  03/01/20   [provider]  traZODone (DESYREL) 50 MG tablet Take 1 tablet (50 mg total) by mouth at bedtime as needed for sleep. 03/29/20   Ezekiel Slocumb, DO  venlafaxine (EFFEXOR) 37.5 MG tablet Take 37.5 mg by mouth 2 (two) times daily. 01/21/20   [provider]    Verapamil HCl CR 300 MG CP24 Take 300 capsules by mouth daily.     [provider]  Vibegron (GEMTESA) 75 MG TABS Take 75 mg by mouth daily. 03/19/20   Nori Riis, PA-C     Objective    Physical Exam: Vitals:   04/13/20 1227 04/13/20 1231 04/13/20 1300 04/13/20 1330  BP:  (!) 123/59 (!) 101/58 115/60  Pulse:  74 67 67  Resp:  18 15 18   Temp:  98.1 F (36.7 C)  TempSrc:  Oral    SpO2:  96% 93% 96%  Weight: 86.6 kg     Height: 5' 1"  (1.549 m)       General: appears to be stated age; alert, oriented Skin: warm, dry, no rash Head:  AT/[place] Eyes:  PEARL b/l, EOMI Mouth:  Oral mucosa membranes appear dry, normal dentition Neck: supple; trachea midline Heart:  RRR; did not appreciate any M/R/G Lungs: CTAB, did not appreciate any wheezes, rales, or rhonchi Abdomen: + BS; soft, ND, NT Vascular: 2+ pedal pulses b/l; 2+ radial pulses b/l Extremities: no peripheral edema, no muscle wasting Neuro: strength and sensation intact in upper and lower extremities b/l   Labs on Admission: I have personally reviewed following labs and imaging studies  CBC: Recent Labs  Lab 04/13/20 1229  WBC 8.2  NEUTROABS 5.1  HGB 12.4  HCT 39.0  MCV 80.9  PLT 102   Basic Metabolic Panel: Recent Labs  Lab 04/13/20 1229  NA 128*  K 3.7  CL 93*  CO2 23  GLUCOSE 172*  BUN 22  CREATININE 1.27*  CALCIUM 9.0  MG 2.0   GFR: Estimated Creatinine Clearance: 34.1 mL/min (A) (by C-G formula based on SCr of 1.27 mg/dL (H)). Liver Function Tests: Recent Labs  Lab 04/13/20 1229  AST 23  ALT 20  ALKPHOS 49  BILITOT 0.6  PROT 6.7  ALBUMIN 3.5   Recent Labs  Lab 04/13/20 1229  LIPASE 20   No results for input(s): AMMONIA in the last 168 hours. Coagulation Profile: No results for input(s): INR, PROTIME in the last 168 hours. Cardiac Enzymes: No results for input(s): CKTOTAL, CKMB, CKMBINDEX, TROPONINI in the last 168 hours. BNP (last 3 results) No results for  input(s): PROBNP in the last 8760 hours. HbA1C: No results for input(s): HGBA1C in the last 72 hours. CBG: Recent Labs  Lab 04/13/20 1226  GLUCAP 171*   Lipid Profile: No results for input(s): CHOL, HDL, LDLCALC, TRIG, CHOLHDL, LDLDIRECT in the last 72 hours. Thyroid Function Tests: Recent Labs    04/13/20 1229  TSH 14.535*  FREET4 0.68   Anemia Panel: No results for input(s): VITAMINB12, FOLATE, FERRITIN, TIBC, IRON, RETICCTPCT in the last 72 hours. Urine analysis:    Component Value Date/Time   COLORURINE YELLOW (A) 04/13/2020 1428   APPEARANCEUR CLEAR (A) 04/13/2020 1428   APPEARANCEUR Cloudy (A) 09/20/2017 1106   LABSPEC 1.011 04/13/2020 1428   LABSPEC 1.010 02/22/2012 1204   PHURINE 6.0 04/13/2020 1428   GLUCOSEU NEGATIVE 04/13/2020 1428   GLUCOSEU Negative 02/22/2012 1204   HGBUR NEGATIVE 04/13/2020 1428   BILIRUBINUR NEGATIVE 04/13/2020 1428   BILIRUBINUR Negative 09/20/2017 1106   BILIRUBINUR Negative 02/22/2012 1204   KETONESUR NEGATIVE 04/13/2020 1428   PROTEINUR NEGATIVE 04/13/2020 1428   NITRITE POSITIVE (A) 04/13/2020 1428   LEUKOCYTESUR NEGATIVE 04/13/2020 1428   LEUKOCYTESUR 2+ 02/22/2012 1204    Radiological Exams on Admission: DG Chest 1 View  Result Date: 04/13/2020 CLINICAL DATA:  Syncope EXAM: CHEST  1 VIEW COMPARISON:  05/20/2018 FINDINGS: Interval post CABG changes to the chest. Loop recorder device present. Stable cardiomediastinal contours. Densely calcified aorta. No focal airspace consolidation, pleural effusion, or pneumothorax. IMPRESSION: No acute cardiopulmonary abnormality. Electronically Signed   By: Davina Poke D.O.   On: 04/13/2020 12:56   CT ABDOMEN PELVIS W CONTRAST  Result Date: 04/13/2020 CLINICAL DATA:  84 year old female with history of acute onset of non localizable abdominal pain. EXAM: CT ABDOMEN AND PELVIS WITH CONTRAST TECHNIQUE:  Multidetector CT imaging of the abdomen and pelvis was performed using the standard  protocol following bolus administration of intravenous contrast. CONTRAST:  73m OMNIPAQUE IOHEXOL 300 MG/ML  SOLN COMPARISON:  CT the abdomen and pelvis 03/28/2020. FINDINGS: Lower chest: Atherosclerotic calcifications in the thoracic aorta as well as the left circumflex and right coronary arteries. Severe calcifications of the aortic valve and mitral annulus. Median sternotomy wires. Hepatobiliary: No suspicious cystic or solid hepatic lesions. Status post cholecystectomy. Very mild intra and extrahepatic biliary ductal dilatation, similar to the prior study, favored to reflect benign post cholecystectomy physiology. Pancreas: Diffuse pancreatic atrophy. No pancreatic mass. No pancreatic ductal dilatation. No pancreatic or peripancreatic fluid collections or inflammatory changes. Spleen: Unremarkable. Adrenals/Urinary Tract: Low-attenuation lesions in the right kidney compatible with simple cysts, largest of which is in the upper pole measuring 2.5 x 2.2 cm. Left kidney and bilateral adrenal glands are normal in appearance. No hydroureteronephrosis. Urinary bladder is normal in appearance. Stomach/Bowel: Normal appearance of the stomach. No pathologic dilatation of small bowel or colon. Moderate volume of stool throughout the colon. Normal appendix. Vascular/Lymphatic: Aortic atherosclerosis, without evidence of aneurysm or dissection in the abdominal or pelvic vasculature. No lymphadenopathy noted in the abdomen or pelvis. Reproductive: Uterus and ovaries are unremarkable in appearance. Other: No significant volume of ascites.  No pneumoperitoneum. Musculoskeletal: Old healed fractures of the right superior and inferior pubic rami. There are no aggressive appearing lytic or blastic lesions noted in the visualized portions of the skeleton. IMPRESSION: 1. No acute findings are noted in the abdomen or pelvis. 2. Moderate stool burden throughout the colon which may suggest constipation. 3. Aortic atherosclerosis, in  addition to at least right coronary artery disease. 4. There are calcifications of the aortic valve and mitral annulus. Echocardiographic correlation for evaluation of potential valvular dysfunction may be warranted if clinically indicated. 5. Additional incidental findings, as above. Electronically Signed   By: DVinnie LangtonM.D.   On: 04/13/2020 15:18     EKG: Independently reviewed, with result as described above.    Assessment/Plan   LRUHEE ENCKis a 84y.o. female with medical history significant for type 2 diabetes mellitus associated with peripheral polyneuropathy, orthostatic hypotension, urge incontinence, [facility] of syncopal event that occurred while at her urology appointment.    Principal Problem:   AKI (acute kidney injury) ([place] Active Problems:   Diabetes mellitus type 2, insulin dependent ([place]   Syncope   Acute hyponatremia   Dehydration   Diarrhea   Abdominal pain    #) Acute kidney injury: Presenting serum creatinine noted to be 1.27 relative to most recent prior value of 0.91 when checked on 03/29/2020.  Suspect that this is prerenal in nature in the setting of interval dehydration due to increased GI losses in the form of recent episodes of loose stool as well as vomiting, further complicated by diminished oral intake over the last few days.  Furthermore, there may be an additional pharmacologic complicating factors in the form of the presence of losartan on outpatient medication list.  An element of dehydration is consistent with presenting urinalysis results, which showed the presence of hyaline cast.  In general will provide IV fluid resuscitation additional IV fluids while needing additional renal evaluation as well as work-up for recent episodes of loose stool, as  further described below.  Plan: Add on random urine sodium as well as random urine creatinine.  Monitor strict I's and O's and daily weights.  Attempt avoid nephrotoxic agents.  Hold home losartan.  In the setting clinical appearance of intravascular depletion, will hold home Lasix and HCTZ for now.  Continuous lactated Ringer's overnight.  Repeat BMP in the morning.  Check serum magnesium level.  Work-up and management of recent loose stools, as further described below.     #) Acute hypoosmolar hyporvolemic hyponatremia: Present labs reflect serum sodium of 128 without requirement for hyperglycemic adjustment.  This appears to be an acute finding with clinical evidence of systemic hypovolemia due to increased GI losses and diminished oral intake, with additional exacerbation from persistent use of multiple outpatient diuretics, most notably HCTZ in the context of concomitant hypochloremia.   Plan: Add on random urine sodium as well as urine osmolality to urine sample collected in the ED.  Also check serum osmolality to confirm hyperosmolar etiology.  Monitor strict I's and O's and daily weights.  Check TSH.  Gentle IV fluids overnight.  Repeat BMP in the morning.  Hold home HCTZ and Lasix for now.      #) Loose stools: The patient reports 2-3 episodes of nonbloody loose stools over the last few days, associated with mild abdominal discomfort and intermittent nausea resulting in 1 episode of nonbloody, nonbilious emesis.  As the patient has intermittently been on antibiotics for her chronic cystitis, I feel it is prudent to rule out underlying C. difficile colitis.  Differential also includes viral gastroenteritis, which is anticipated to be self-limited.  Of note, stool sample from the ED was sent was sent for evaluation of C. difficile PCR, stool lactoferrin, and ova and parasites, with these results currently pending.  CT abdomen/pelvis performed today showed no evidence of acute intra-abdominal  process, including no evidence of obstruction, abscess, or bowel perforation.   Plan: Follow for results of stool studies, while providing gentle IV fluid resuscitation.  Will refrain from antimotility agents until infectious process has been ruled out.  Repeat CMP in the morning.  Repeat CBC with differential tomorrow.  Hold home PPI as this can be associated with diarrhea.  As needed Tylenol for residual abdominal discomfort.  As needed Zofran.     #) Syncope: The patient experienced a single witnessed event when attempting to rise from a seated to standing position the urology clinic earlier today.  Suspect that this is on the basis of orthostatic hypotension not only given the sequence of events, but finding of dehydration in the setting of recent increased GI losses and concomitant decline in oral intake, further confounded by mild loss of compensatory vasoconstriction and tachycardia due to multiple inhibitory outpatient medications, including the presence of beta-blocker, losartan, and verapamil.  ACS is felt to be less likely in the absence of any chest discomfort, negative troponin high values x2, and EKG showing no evidence of acute ischemic changes.  Furthermore, the patient has a documented history of multiple prior syncopal episodes in the setting of orthostatic hypotension.   Plan: Monitor on telemetry.  Work-up and management of presenting dehydration, including overnight IV fluids.  Monitor strict I's and O's and daily weights.  Check and record orthostatic vital signs, with the caveat that the patient has already received 2.5 L of IV fluids, complicating evaluation of ensuing orthostatic vital signs.  Given dehydrated appearance, will hold home HCTZ and Lasix overnight.      #) Urinary tract infection: The patient was  started on Rocephin in the ED today in response to urinalysis showing 6-10 white blood cells, many bacteria, and positive nitrates, all in the context of recent dysuria.  While 6-10 white blood cells is considered insignificant pyuria for a female, will continue 2 more days of antibiotic coverage in order to complete a 3-day course for uncomplicated UTI given the concomitant presence of urinary symptoms, as further described above.  Of note, SIRS criteria not met for sepsis at this time.  Blood cultures x2 were collected in the ED today prior to initiation of antibiotics.  While laboratory evaluation reveals the presence of lactic acidosis, I do not feel that this is on the basis of sepsis, noninfectious differential to explain this elevation as further described below.  Plan: Continue IV Rocephin x2 more doses.  Monitor for results of blood cultures x2.  Repeat CBC with differential in the morning.      #) Lactic acidosis: Initial lactic acid noted to be 3.6, with interval trend down to 2.8 following.  I do not feel that this elevation is on the basis of this criteria are not met qualification of such.  I feel that the elevated lactic acid stems from a contribution from acute kidney injury as well as likely starvation keto lactic acidosis given the patient's reported a significant decline in oral intake over the last 1 to 2 days..   Plan: Continue IV fluids and repeat lactic acid level.  Repeat management of presenting acute kidney injury, as further described below.     #) Type 2 diabetes mellitus, complicated by diabetic polyperipheral neuropathy.  To most recent hemoglobin A1c noted to be 6.3% when checked on 03/28/2020.  Outpatient insulin regimen consists of Lantus 12 units subcu nightly.  I do not see any short acting insulin included on the patient's current medication list.  Oral hypoglycemic agents include Janumet and glipizide.  Additionally, she is on multiple agents for treatment of her diabetic polyperipheral neuropathy, including gabapentin and Lyrica.  Presenting blood sugar per presenting CMP noted to be 172.  Plan: Hold home basal insulin for now.   Accu-Cheks before every meal and at bedtime with moderate dose sliding scale insulin.  For now, will resume home gabapentin, holding home Lyrica overnight.      DVT prophylaxis: scd's  Code Status: Full code Family Communication: none Disposition Plan: Per Rounding Team Consults called: none  Admission status: Inpatient; med telemetry.  COVID-19 Vaccination status: Pfizer vaccine x2 doses in January and February 2021.    PLEASE NOTE THAT DRAGON DICTATION SOFTWARE WAS USED IN THE CONSTRUCTION OF THIS NOTE.   [place] [facility] Pager [phone] From 12PM- 12AM  Otherwise, please contact night-coverage  [email or web address] Password [facility]  04/13/2020, 3:44 PM

## 2020-04-13 NOTE — ED Triage Notes (Signed)
Pt here from [place] [facility]- Pt had syncopal episode in chair. Pt does not remember event, does remember not feeling well prior.  Pt awake and alert at this time. Pt complaining of abdominal pain.

## 2020-04-13 NOTE — ED Notes (Signed)
Date and time results received: 04/13/20 1:22 PM   Test: Lactic Acid  Critical Value: 3.6  Name of Provider Notified: Dr Tamala Julian MD

## 2020-04-13 NOTE — ED Notes (Signed)
Pt given warm blankets at this time. Son at bedside

## 2020-04-13 NOTE — ED Provider Notes (Signed)
[facility] Department of Emergency Medicine   Code Blue CONSULT NOTE  Chief Complaint: Cardiac arrest/unresponsive.    Level V Caveat: Unresponsive  History of present illness: I was contacted by the hospital for a CODE BLUE at the urology clinic and presented to the patient's bedside.  Per staff at the clinic patient was noted to become acutely unresponsive.  It is unclear how long she was unresponsive for the patient did answer to her name on my arrival and appeared to be slowly waking up.  She had 2+ bilateral radial pulses and clear lungs bilaterally.  Patient was immediately transferred to ED stretcher and vital signs were obtained.  Patient endorsed having some abdominal pain that began shortly before arriving at the clinic earlier today and currently feeling nauseous.  She denies any other acute complaints.  Initial sats were reported to be 88% on room air and patient was placed on 2 L nasal cannula.  Patient's heart rate was noted to be in the 80s on my assessment.  SPO2 was 94% on 2 L.  Patient was noted to be afebrile to clinic.  ROS: Unable to obtain, Level V caveat  Scheduled Meds: Continuous Infusions: PRN Meds:. Past Medical History:  Diagnosis Date  . Anemia   . Anxiety   . Arthritis, degenerative   . Chronic cystitis   . Depression   . Diabetes mellitus without complication ([place])   . GERD (gastroesophageal reflux disease)   . Headache   . Heart disease   . HLD (hyperlipidemia)   . HTN (hypertension)   . Hypertension   . Incontinence in female   . Murmur   . Obesity   . Sleep apnea   . Urge incontinence of urine    Past Surgical History:  Procedure Laterality Date  . BREAST BIOPSY Right 2004   neg  . BUNIONECTOMY    . CARDIAC SURGERY     Stent  . CHOLECYSTECTOMY    . COLONOSCOPY WITH PROPOFOL N/A 06/14/2017   Procedure: COLONOSCOPY WITH PROPOFOL;  Surgeon: Lollie Sails, MD;  Location: [facility] ENDOSCOPY;  Service: Endoscopy;   Laterality: N/A;  . JOINT REPLACEMENT     TKR Bilateral  . LEFT HEART CATH AND CORONARY ANGIOGRAPHY N/A 05/22/2018   Procedure: LEFT HEART CATH AND CORONARY ANGIOGRAPHY;  Surgeon: Corey Skains, MD;  Location: [facility];  Service: Cardiovascular;  Laterality: N/A;  . LOOP RECORDER INSERTION N/A 09/20/2016   Procedure: Loop Recorder Insertion;  Surgeon: Isaias Cowman, MD;  Location: [place] [facility];  Service: Cardiovascular;  Laterality: N/A;  . SEPTOPLASTY     Social History   Socioeconomic History  . Marital status: Widowed    Spouse name: Not on file  . Number of children: Not on file  . Years of education: Not on file  . Highest education level: Not on file  Occupational History  . Not on file  Tobacco Use  . Smoking status: Never Smoker  . Smokeless tobacco: Never Used  Substance and Sexual Activity  . Alcohol use: No  . Drug use: No  . Sexual activity: Not on file  Other Topics Concern  . Not on file  Social History Narrative  . Not on file   Social Determinants of Health   Financial Resource Strain:   . Difficulty of Paying Living Expenses: Not on file  Food Insecurity:   . Worried About Charity fundraiser in the Last Year: Not on file  .  Ran Out of Food in the Last Year: Not on file  Transportation Needs:   . Lack of Transportation (Medical): Not on file  . Lack of Transportation (Non-Medical): Not on file  Physical Activity:   . Days of Exercise per Week: Not on file  . Minutes of Exercise per Session: Not on file  Stress:   . Feeling of Stress : Not on file  Social Connections:   . Frequency of Communication with Friends and Family: Not on file  . Frequency of Social Gatherings with Friends and Family: Not on file  . Attends Religious Services: Not on file  . Active Member of Clubs or Organizations: Not on file  . Attends Archivist Meetings: Not on file  . Marital Status: Not on file  Intimate Partner Violence:   . Fear  of Current or Ex-Partner: Not on file  . Emotionally Abused: Not on file  . Physically Abused: Not on file  . Sexually Abused: Not on file   Allergies  Allergen Reactions  . Accupril [Quinapril Hcl] Rash  . Micardis [Telmisartan] Other (See Comments)    Dizziness  . Quinapril Rash    Last set of Vital Signs (not current) Vitals:   04/13/20 1231  BP: (!) 123/59  Pulse: 74  Resp: 18  Temp: 98.1 F (36.7 C)  SpO2: 96%      Physical Exam  Gen: Obese, ill-appearing, with dry mucous membranes. Cardiovascular: 2+ bilateral radial pulses. Resp: apneic.  Clear bilaterally. Neuro: GCS 15.  Patient following commands in all extremities. HEENT: PERRLA.  EOMI.  No neck rigidity. Neck: No crepitus  Musculoskeletal: No deformity  Skin: warm  Procedures (when applicable, including Critical Care time): Procedures   MDM / Assessment and Plan Likely syncopal episode.  Patient was never pulseless and was responding appropriately and hemodynamically stable on my arrival.  Patient immediately transferred to the ED.  Please see emergency department documentation for further details regarding patient's emergency department evaluation assessment.   Lucrezia Starch, MD 04/13/20 [phone]

## 2020-04-13 NOTE — ED Provider Notes (Signed)
[facility] Emergency Department Provider Note  ____________________________________________   First MD Initiated Contact with Patient 04/13/20 1223     (approximate)  I have reviewed the triage vital signs and the nursing notes.   HISTORY  Chief Complaint Weakness   HPI Brandi [name] is a 84 y.o. female with a past medical history of HTN, HDL, obesity, OSA, depression, arthritis, anemia, DM, GERD, and chronic recurrent urinary tract infections who presents to the ED after CODE BLUE was called after patient became unresponsive at urology clinic earlier this morning.  CODE BLUE note written by myself as dictated separately.  In brief patient had a witnessed syncopal episode at urology clinic.  Patient states she has some abdominal discomfort that began today and has had some nausea as well as one episode of nonbloody nonbilious vomiting.  She also states that yesterday she had some nonbloody diarrhea.  She endorses fatigue and cough over the last several days but is unsure when the symptoms began.  No fevers, headache, earache, chest pain, blood in her sputum, blood in her urine, vaginal bleeding, discharge, blood in her stool, rash, extremity pain, recent falls or injuries.  Denies EtOH or illicit drug use.         Past Medical History:  Diagnosis Date  . Anemia   . Anxiety   . Arthritis, degenerative   . Chronic cystitis   . Depression   . Diabetes mellitus without complication ([place])   . GERD (gastroesophageal reflux disease)   . Headache   . Heart disease   . HLD (hyperlipidemia)   . HTN (hypertension)   . Hypertension   . Incontinence in female   . Murmur   . Obesity   . Sleep apnea   . Urge incontinence of urine     Patient Active Problem List   Diagnosis Date Noted  . Syncope and collapse 03/28/2020  . CAD (coronary artery disease) 03/28/2020  . Unstable angina ([place]) 05/21/2018  . Chest pain 05/20/2018  . Positional lightheadedness  03/14/2017  . Diabetes mellitus type 2, insulin dependent ([place]) 03/14/2017  . Action tremor 09/11/2016  . Headache disorder 09/11/2016  . Severe sepsis ([place]) 04/14/2016  . Orthostatic hypotension 11/10/2015  . Diabetic neuropathy ([place]) 10/06/2015  . Recurrent UTI 09/13/2015  . Cystocele, grade 3 09/13/2015  . Incontinence 09/13/2015  . Depression, major, in remission ([place]) 12/23/2014  . Pernicious anemia 12/23/2014  . Coronary arteriosclerosis 01/06/2014  . Sleep apnea in adult 01/06/2014  . Benign essential hypertension 12/01/2013  . Obesity, unspecified 12/01/2013  . Acquired cyst of kidney 05/02/2013  . Chronic cystitis 03/11/2012    Past Surgical History:  Procedure Laterality Date  . BREAST BIOPSY Right 2004   neg  . BUNIONECTOMY    . CARDIAC SURGERY     Stent  . CHOLECYSTECTOMY    . COLONOSCOPY WITH PROPOFOL N/A 06/14/2017   Procedure: COLONOSCOPY WITH PROPOFOL;  Surgeon: Lollie Sails, MD;  Location[facility] ENDOSCOPY;  Service: Endoscopy;  Laterality: N/A;  . JOINT REPLACEMENT     TKR Bilateral  . LEFT HEART CATH AND CORONARY ANGIOGRAPHY N/A 05/22/2018   Procedure: LEFT HEART CATH AND CORONARY ANGIOGRAPHY;  Surgeon: Corey Skains, MD;  Location: [place] [facility];  Service: Cardiovascular;  Laterality: N/A;  . LOOP RECORDER INSERTION N/A 09/20/2016   Procedure: Loop Recorder Insertion;  Surgeon: Isaias Cowman, MD;  Location: [place] [facility];  Service: Cardiovascular;  Laterality: N/A;  . SEPTOPLASTY  Prior to Admission medications   Medication Sig Start Date End Date Taking? Authorizing Provider  acetaminophen (TYLENOL) 325 MG tablet Take 650 mg by mouth every 6 (six) hours as needed.    [provider]  albuterol (VENTOLIN HFA) 108 (90 Base) MCG/ACT inhaler Inhale 1-2 puffs into the lungs every 6 (six) hours as needed for wheezing or shortness of breath.    [provider]  aspirin 81 MG chewable tablet Chew 81 mg by mouth  daily.    [provider]  atorvastatin (LIPITOR) 40 MG tablet Take 40 mg by mouth every evening. 08/05/19   [provider]  budesonide-formoterol (SYMBICORT) 80-4.5 MCG/ACT inhaler Inhale 2 puffs into the lungs 2 (two) times daily.    [provider]  Calcium Carb-Cholecalciferol (CALCIUM + D3 PO) Take 1 tablet by mouth daily.    [provider]  carboxymethylcellulose (REFRESH TEARS) 0.5 % SOLN Apply 1-2 drops to eye at bedtime.     [provider]  carvedilol (COREG) 12.5 MG tablet Take 12.5 mg by mouth 2 (two) times daily. 02/07/20   [provider]  Cyanocobalamin (VITAMIN B-12 PO) Take 1 tablet by mouth daily.    [provider]  famotidine (PEPCID) 20 MG tablet Take 20 mg by mouth 2 (two) times daily.    [provider]  furosemide (LASIX) 20 MG tablet Take 20 mg by mouth 2 (two) times daily. 01/08/20   [provider]  gabapentin (NEURONTIN) 100 MG capsule Take 200 mg by mouth 2 (two) times daily. 01/21/20   [provider]  glipiZIDE (GLUCOTROL) 5 MG tablet Take 5 mg by mouth 2 (two) times daily.  08/19/13   [provider]  hydrochlorothiazide (HYDRODIURIL) 25 MG tablet Take 25 mg by mouth daily. 02/14/20   [provider]  Insulin Glargine (LANTUS SOLOSTAR) 100 UNIT/ML Solostar Pen Inject 12 Units into the skin at bedtime.  07/20/15   [provider]  JANUMET XR 50-1000 MG TB24 Take 1 tablet by mouth 2 (two) times daily. 10/02/18   [provider]  losartan (COZAAR) 100 MG tablet Take 100 mg by mouth daily.    [provider]  Multiple Vitamins-Minerals (PRESERVISION/LUTEIN) CAPS Take 1 capsule by mouth 2 (two) times daily. 03/29/20   Ezekiel Slocumb, DO  nortriptyline (PAMELOR) 25 MG capsule Take 25 mg by mouth at bedtime. 03/13/20   [provider]  pantoprazole (PROTONIX) 20 MG tablet Take 20 mg by mouth 2 (two) times daily. 02/20/20   [provider]  prasugrel (EFFIENT) 10 MG TABS tablet Take 10 mg by mouth daily.     [provider]  pregabalin (LYRICA) 75 MG capsule Take 1 capsule (75 mg total) by mouth 2 (two) times daily. 03/29/20   Ezekiel Slocumb, DO  senna-docusate (SENOKOT-S) 8.6-50 MG tablet Take 1 tablet by mouth at bedtime as needed for mild constipation. 03/29/20   Ezekiel Slocumb, DO  sodium chloride (ALTAMIST SPRAY) 0.65 % nasal spray Place into the nose.     [provider]  sucralfate (CARAFATE) 1 g tablet Take 1 g by mouth 2 (two) times daily.  03/01/20   [provider]  traZODone (DESYREL) 50 MG tablet Take 1 tablet (50 mg total) by mouth at bedtime as needed for sleep. 03/29/20   Ezekiel Slocumb, DO  venlafaxine (EFFEXOR) 37.5 MG tablet Take 37.5 mg by mouth 2 (two) times daily. 01/21/20   [provider]  Verapamil HCl  CR 300 MG CP24 Take 300 capsules by mouth daily.     [provider]  Vibegron (GEMTESA) 75 MG TABS Take 75 mg by mouth daily. 03/19/20   Zara Council A, PA-C    Allergies Accupril [quinapril hcl], Micardis [telmisartan], and Quinapril  Family History  Problem Relation Age of Onset  . Breast cancer Cousin   . Skin cancer Other   . Diabetes Other   . Hypercholesterolemia Other   . Hypertension Other   . Bladder Cancer Maternal Aunt   . COPD Other   . Kidney disease Neg Hx   . Kidney cancer Neg Hx     Social History Social History   Tobacco Use  . Smoking status: Never Smoker  . Smokeless tobacco: Never Used  Substance Use Topics  . Alcohol use: No  . Drug use: No    Review of Systems  Review of Systems  Constitutional: Positive for malaise/fatigue. Negative for chills and fever.  HENT: Negative for sore throat.   Eyes: Negative for pain.  Respiratory: Positive for cough. Negative for shortness of breath and stridor.   Cardiovascular: Negative for chest pain.  Gastrointestinal: Positive for abdominal pain, diarrhea  and nausea. Negative for vomiting.  Genitourinary: Negative for dysuria.  Musculoskeletal: Negative for myalgias.  Skin: Negative for rash.  Neurological: Positive for loss of consciousness. Negative for seizures and headaches.  Psychiatric/Behavioral: Negative for suicidal ideas.  All other systems reviewed and are negative.     ____________________________________________   PHYSICAL EXAM:  VITAL SIGNS: ED Triage Vitals  Enc Vitals Group     BP      Pulse      Resp      Temp      Temp src      SpO2      Weight      Height      Head Circumference      Peak Flow      Pain Score      Pain Loc      Pain Edu?      Excl. in [place]?    Vitals:   04/13/20 1300 04/13/20 1330  BP: (!) 101/58 115/60  Pulse: 67 67  Resp: 15 18  Temp:    SpO2: 93% 96%   Physical Exam Vitals and nursing note reviewed.  Constitutional:      General: She is not in acute distress.    Appearance: She is well-developed. She is obese.  HENT:     Head: Normocephalic and atraumatic.     Right Ear: External ear normal.     Left Ear: External ear normal.     Nose: Nose normal.     Mouth/Throat:     Mouth: Mucous membranes are dry.  Eyes:     Conjunctiva/sclera: Conjunctivae normal.  Cardiovascular:     Rate and Rhythm: Normal rate and regular rhythm.     Heart sounds: No murmur heard.   Pulmonary:     Effort: Pulmonary effort is normal. No respiratory distress.     Breath sounds: Normal breath sounds.  Abdominal:     Palpations: Abdomen is soft.     Tenderness: There is no abdominal tenderness.  Musculoskeletal:     Cervical back: Neck supple.  Skin:    General: Skin is warm and dry.     Capillary Refill: Capillary refill takes 2 to 3 seconds.  Neurological:     Mental Status: She is alert and oriented to person, place,  and time.  Psychiatric:        Mood and Affect: Mood normal.     Patient is alert and oriented.  PERRLA.  EOMI.  Patient is all extremities spontaneously.  Sensation  is intact to light touch of all extremities. ____________________________________________   LABS (all labs ordered are listed, but only abnormal results are displayed)  Labs Reviewed  GLUCOSE, CAPILLARY - Abnormal; Notable for the following components:      Result Value   Glucose-Capillary 171 (*)    All other components within normal limits  CBC WITH DIFFERENTIAL/PLATELET - Abnormal; Notable for the following components:   MCH 25.7 (*)    All other components within normal limits  COMPREHENSIVE METABOLIC PANEL - Abnormal; Notable for the following components:   Sodium 128 (*)    Chloride 93 (*)    Glucose, Bld 172 (*)    Creatinine, Ser 1.27 (*)    GFR, Estimated 42 (*)    All other components within normal limits  LACTIC ACID, PLASMA - Abnormal; Notable for the following components:   Lactic Acid, Venous 3.6 (*)    All other components within normal limits  LACTIC ACID, PLASMA - Abnormal; Notable for the following components:   Lactic Acid, Venous 2.8 (*)    All other components within normal limits  TSH - Abnormal; Notable for the following components:   TSH 14.535 (*)    All other components within normal limits  URINALYSIS, COMPLETE (UACMP) WITH MICROSCOPIC - Abnormal; Notable for the following components:   Color, Urine YELLOW (*)    APPearance CLEAR (*)    Nitrite POSITIVE (*)    Bacteria, UA MANY (*)    All other components within normal limits  RESPIRATORY PANEL BY RT PCR (FLU A&B, COVID)  GASTROINTESTINAL PANEL BY PCR, STOOL (REPLACES STOOL CULTURE)  C DIFFICILE QUICK SCREEN W PCR REFLEX  CULTURE, BLOOD (ROUTINE X 2)  CULTURE, BLOOD (ROUTINE X 2)  LIPASE, BLOOD  MAGNESIUM  T4, FREE  TROPONIN I (HIGH SENSITIVITY)  TROPONIN I (HIGH SENSITIVITY)   ____________________________________________  EKG  Sinus rhythm with a ventricular rate of 74, normal axis, unremarkable intervals, no clear evidence of acute ischemia or other significant underlying  arrhythmia. ____________________________________________  RADIOLOGY  ED MD interpretation: Chest x-ray shows no evidence of pneumonia, effusion, no thorax, or significant edema.  No other acute intrathoracic abnormality.  Official radiology report(s): DG Chest 1 View  Result Date: 04/13/2020 CLINICAL DATA:  Syncope EXAM: CHEST  1 VIEW COMPARISON:  05/20/2018 FINDINGS: Interval post CABG changes to the chest. Loop recorder device present. Stable cardiomediastinal contours. Densely calcified aorta. No focal airspace consolidation, pleural effusion, or pneumothorax. IMPRESSION: No acute cardiopulmonary abnormality. Electronically Signed   By: Davina Poke D.O.   On: 04/13/2020 12:56   CT ABDOMEN PELVIS W CONTRAST  Result Date: 04/13/2020 CLINICAL DATA:  84 year old female with history of acute onset of non localizable abdominal pain. EXAM: CT ABDOMEN AND PELVIS WITH CONTRAST TECHNIQUE: Multidetector CT imaging of the abdomen and pelvis was performed using the standard protocol following bolus administration of intravenous contrast. CONTRAST:  3mL OMNIPAQUE IOHEXOL 300 MG/ML  SOLN COMPARISON:  CT the abdomen and pelvis 03/28/2020. FINDINGS: Lower chest: Atherosclerotic calcifications in the thoracic aorta as well as the left circumflex and right coronary arteries. Severe calcifications of the aortic valve and mitral annulus. Median sternotomy wires. Hepatobiliary: No suspicious cystic or solid hepatic lesions. Status post cholecystectomy. Very mild intra and extrahepatic biliary ductal dilatation, similar to  the prior study, favored to reflect benign post cholecystectomy physiology. Pancreas: Diffuse pancreatic atrophy. No pancreatic mass. No pancreatic ductal dilatation. No pancreatic or peripancreatic fluid collections or inflammatory changes. Spleen: Unremarkable. Adrenals/Urinary Tract: Low-attenuation lesions in the right kidney compatible with simple cysts, largest of which is in the upper  pole measuring 2.5 x 2.2 cm. Left kidney and bilateral adrenal glands are normal in appearance. No hydroureteronephrosis. Urinary bladder is normal in appearance. Stomach/Bowel: Normal appearance of the stomach. No pathologic dilatation of small bowel or colon. Moderate volume of stool throughout the colon. Normal appendix. Vascular/Lymphatic: Aortic atherosclerosis, without evidence of aneurysm or dissection in the abdominal or pelvic vasculature. No lymphadenopathy noted in the abdomen or pelvis. Reproductive: Uterus and ovaries are unremarkable in appearance. Other: No significant volume of ascites.  No pneumoperitoneum. Musculoskeletal: Old healed fractures of the right superior and inferior pubic rami. There are no aggressive appearing lytic or blastic lesions noted in the visualized portions of the skeleton. IMPRESSION: 1. No acute findings are noted in the abdomen or pelvis. 2. Moderate stool burden throughout the colon which may suggest constipation. 3. Aortic atherosclerosis, in addition to at least right coronary artery disease. 4. There are calcifications of the aortic valve and mitral annulus. Echocardiographic correlation for evaluation of potential valvular dysfunction may be warranted if clinically indicated. 5. Additional incidental findings, as above. Electronically Signed   By: Vinnie Langton M.D.   On: 04/13/2020 15:18    ____________________________________________   PROCEDURES  Procedure(s) performed (including Critical Care):  .1-3 Lead EKG Interpretation Performed by: Lucrezia Starch, MD Authorized by: Lucrezia Starch, MD     Interpretation: normal     ECG rate assessment: normal     Rhythm: sinus rhythm     Ectopy: none     Conduction: normal       ____________________________________________   INITIAL IMPRESSION / ASSESSMENT AND PLAN / ED COURSE        Patient presents with [place] to history exam for assessment after witnessed syncopal episode at urology clinic  earlier today.  Patient was being seen on a routine basis for evaluation of recurrent urinary tract infections and incontinence.  On arrival patient is hemodynamically stable on room air.  Patient does appear dehydrated on exam but does not have any focal deficits.  Abdomen is soft nontender throughout.  Differential includes but is not limited to syncope secondary to dehydration from poor p.o. intake, GI losses with reported diarrhea and vomiting, sepsis, metabolic derangements, anemia, PE, ACS, arrhythmia, and thyroid derangements.  No focal deficits on exam to suggest CVA.  Low suspicion for ACS given patient denies any chest pain has a reassuring EKG with nonelevated troponin.  Patient is not tachycardic, tachypneic, or hypoxic and I will suspicion for PE at this time.  CBC is unremarkable with no evidence of anemia and normal white blood cell count and platelets.  CMP does show evidence of hyponatremia with NA of 128 as well as evidence of an AKI with a creatinine of 1.27 which is compared to 0.91 obtained 2 weeks ago.  There are no other significant metabolic derangements although patient is slightly hyperglycemic with a glucose of 172.  While TSH is elevated free T4 is WNL and a low suspicion for myxedema coma or thyrotoxicosis at this time.  Magnesium is unremarkable.  Lactic acid is elevated 3.6.  UA does appear infected with positive nitrates, many bacteria, and 6-10 WBCs.  Suspect patient's elevated lactic acid is more likely  secondary to significant dehydration which is likely driving patient's syncope today.  While lactic acid is elevated patient is not hypotensive, tachycardic and has no elevation of white blood cell count, fever, or findings on CT abdomen pelvis to suggest pyelonephritis or other acute infectious source.  I have an abundance of caution blood cultures were obtained and patient was given IV antibiotics and IV fluids.  However do not believe patient is septic at this time.  I  will plan to admit to medicine service for further evaluation and management.    ____________________________________________   FINAL CLINICAL IMPRESSION(S) / ED DIAGNOSES  Final diagnoses:  Syncope, unspecified syncope type  Urinary tract infection without hematuria, site unspecified  Lactic acid acidosis  Dehydration  Hyperglycemia  AKI (acute kidney injury) ([place])  Atherosclerosis    Medications  lactated ringers bolus 500 mL (has no administration in time range)  lactated ringers bolus 1,000 mL (0 mLs Intravenous Stopped 04/13/20 1359)  ondansetron (ZOFRAN) injection 4 mg (4 mg Intravenous Given 04/13/20 1237)  iohexol (OMNIPAQUE) 300 MG/ML solution 75 mL (75 mLs Intravenous Contrast Given 04/13/20 1405)  cefTRIAXone (ROCEPHIN) 1 g in sodium chloride 0.9 % 100 mL IVPB (1 g Intravenous New Bag/Given 04/13/20 1500)     ED Discharge Orders    None       Note:  This document was prepared using Dragon voice recognition software and may include unintentional dictation errors.   Lucrezia Starch, MD 04/13/20 1536

## 2020-04-13 NOTE — ED Notes (Signed)
Pt taken to CT.

## 2020-04-14 DIAGNOSIS — N179 Acute kidney failure, unspecified: Secondary | ICD-10-CM | POA: Diagnosis not present

## 2020-04-14 LAB — COMPREHENSIVE METABOLIC PANEL
ALT: 17 U/L (ref 0–44)
AST: 17 U/L (ref 15–41)
Albumin: 3 g/dL — ABNORMAL LOW (ref 3.5–5.0)
Alkaline Phosphatase: 44 U/L (ref 38–126)
Anion gap: 10 (ref 5–15)
BUN: 16 mg/dL (ref 8–23)
CO2: 27 mmol/L (ref 22–32)
Calcium: 9 mg/dL (ref 8.9–10.3)
Chloride: 99 mmol/L (ref 98–111)
Creatinine, Ser: 0.88 mg/dL (ref 0.44–1.00)
GFR, Estimated: >60 mL/min (ref >60)
Glucose, Bld: 86 mg/dL (ref 70–99)
Potassium: 3.7 mmol/L (ref 3.5–5.1)
Sodium: 136 mmol/L (ref 135–145)
Total Bilirubin: 0.5 mg/dL (ref 0.3–1.2)
Total Protein: 5.8 g/dL — ABNORMAL LOW (ref 6.5–8.1)

## 2020-04-14 LAB — CBC WITH DIFFERENTIAL/PLATELET
Abs Immature Granulocytes: 0.03 10*3/uL (ref 0.00–0.07)
Basophils Absolute: 0 10*3/uL (ref 0.0–0.1)
Basophils Relative: 0 %
Eosinophils Absolute: 0.3 10*3/uL (ref 0.0–0.5)
Eosinophils Relative: 3 %
HCT: 35.3 % — ABNORMAL LOW (ref 36.0–46.0)
Hemoglobin: 11.8 g/dL — ABNORMAL LOW (ref 12.0–15.0)
Immature Granulocytes: 0 %
Lymphocytes Relative: 17 %
Lymphs Abs: 1.3 10*3/uL (ref 0.7–4.0)
MCH: 26.7 pg (ref 26.0–34.0)
MCHC: 33.4 g/dL (ref 30.0–36.0)
MCV: 79.9 fL — ABNORMAL LOW (ref 80.0–100.0)
Monocytes Absolute: 0.8 10*3/uL (ref 0.1–1.0)
Monocytes Relative: 10 %
Neutro Abs: 5.6 10*3/uL (ref 1.7–7.7)
Neutrophils Relative %: 70 %
Platelets: 237 10*3/uL (ref 150–400)
RBC: 4.42 MIL/uL (ref 3.87–5.11)
RDW: 14.6 % (ref 11.5–15.5)
WBC: 8 10*3/uL (ref 4.0–10.5)
nRBC: 0 % (ref 0.0–0.2)

## 2020-04-14 LAB — GLUCOSE, CAPILLARY
Glucose-Capillary: 85 mg/dL (ref 70–99)
Glucose-Capillary: 190 mg/dL — ABNORMAL HIGH (ref 70–99)
Glucose-Capillary: 191 mg/dL — ABNORMAL HIGH (ref 70–99)
Glucose-Capillary: 216 mg/dL — ABNORMAL HIGH (ref 70–99)

## 2020-04-14 LAB — LACTIC ACID, PLASMA
Lactic Acid, Venous: 1.2 mmol/L (ref 0.5–1.9)
Lactic Acid, Venous: 2.9 mmol/L (ref 0.5–1.9)
Lactic Acid, Venous: 1.6 mmol/L (ref 0.5–1.9)
Lactic Acid, Venous: 1.9 mmol/L (ref 0.5–1.9)

## 2020-04-14 LAB — TSH: TSH: 5.631 u[IU]/mL — ABNORMAL HIGH (ref 0.350–4.500)

## 2020-04-14 LAB — MAGNESIUM: Magnesium: 1.9 mg/dL (ref 1.7–2.4)

## 2020-04-14 LAB — PROTIME-INR
INR: 0.9 (ref 0.8–1.2)
Prothrombin Time: 12.1 seconds (ref 11.4–15.2)

## 2020-04-14 MED ORDER — SODIUM CHLORIDE 0.9 % IV BOLUS
1000.0000 mL | Freq: Once | INTRAVENOUS | Status: AC
  Administered 2020-04-14: 1000 mL via INTRAVENOUS

## 2020-04-14 MED ORDER — LABETALOL HCL 5 MG/ML IV SOLN
10.0000 mg | INTRAVENOUS | Status: DC | PRN
  Administered 2020-04-14 – 2020-04-15 (×4): 10 mg via INTRAVENOUS
  Filled 2020-04-14 (×4): qty 4

## 2020-04-14 NOTE — Progress Notes (Addendum)
PROGRESS NOTE  Brandi [name] KXF:[id number] DOB: Oct 01, 1935 DOA: 04/13/2020 PCP: Baxter Hire, MD  Brief History    Brandi [name] is a 84 y.o. female with medical history significant for type 2 diabetes mellitus associated with peripheral polyneuropathy, orthostatic hypotension, urge incontinence, who is admitted to [facility] on 04/13/2020 with acute kidney injury after presenting directly from outpatient neurology clinic to [facility] Emergency Department for evaluation of syncopal event that occurred while at her urology appointment.   While at urology clinic earlier today for routine follow-up for management of underlying urge incontinence, the patient reportedly experienced a syncopal event when she attempted to rise to a standing position the evaluation table.  The patient denies any associated or preceding chest pain, shortness of breath, palpitations, nausea, vomiting.  This event was witnessed, and reportedly the patient did not hit her head as a component of the fall.  She regained consciousness within a few seconds, and was subsequently brought to emergency department for further evaluation and management of this syncopal event.  Per chart review, it appears that the patient has a history of prior syncopal events, as well as a documented history of orthostatic hypotension.  She reports intermittent nausea resulting in 1 episode of nonbloody, nonbilious emesis yesterday, as well as mild abdominal pain involving the bilateral lower abdominal quadrants.  She notes that this is been associated with 2-3 episodes of diarrhea over the last day in the absence of any associated melena or hematochezia.  She denies any associated subjective fever, chills, rigors, or generalized myalgias.  Denies any recent traveling or known sick contacts.  Overall, over the last 1 to 2 days, in the setting of intermittent nausea, the patient acknowledges diminished oral intake of food  and fluids, but reports that she has continued to take her outpatient antihypertensive and diuretic regimen, which consists of Coreg, Lasix, HCTZ, losartan, and verapamil.   The patient has a documented history of chronic cystitis, and kidneys that she has been experiencing some mild dysuria over the last 3 to 4 days in the absence of any associated gross hematuria or any change in urinary urgency/frequency.  Denies any associated flank discomfort.  The patient was admitted to a telemetry bed as inpatient. She has received IV fluids and IV ceftriaxone.   Consultants  . None  Procedures  . None  Antibiotics   Anti-infectives (From admission, onward)   Start     Dose/Rate Route Frequency Ordered Stop   04/14/20 1400  cefTRIAXone (ROCEPHIN) 1 g in sodium chloride 0.9 % 100 mL IVPB        1 g 200 mL/hr over 30 Minutes Intravenous Every 24 hours 04/13/20 1707     04/13/20 1500  cefTRIAXone (ROCEPHIN) 1 g in sodium chloride 0.9 % 100 mL IVPB        1 g 200 mL/hr over 30 Minutes Intravenous  Once 04/13/20 1449 04/13/20 1544    .  Interval History/Subjective  The patient is resting comfortably, and is anxious to go home.  Objective   Vitals:  Vitals:   04/14/20 0424 04/14/20 0749  BP: (!) 148/67 (!) 157/66  Pulse: 79 78  Resp: 15 16  Temp: 98.3 F (36.8 C) 98.4 F (36.9 C)  SpO2: 100% 97%   Exam:  Constitutional:  . The patient is awake, alert, and oriented x 3. No acute distress. Respiratory:  . No increased work of breathing. . No wheezes, rales, or rhonchi . No tactile fremitus  Cardiovascular:  . Regular rate and rhythm . No murmurs, ectopy, or gallups. . No lateral PMI. No thrills. Abdomen:  . Abdomen is soft, non-tender, non-distended . No hernias, masses, or organomegaly . Normoactive bowel sounds.  Musculoskeletal:  . No cyanosis, clubbing, or edema Skin:  . No rashes, lesions, ulcers . palpation of skin: no induration or nodules Neurologic:  . CN 2-12  intact . Sensation all 4 extremities intact Psychiatric:  . Mental status o Mood, affect appropriate o Orientation to person, place, time  . judgment and insight appear intact  I have personally reviewed the following:   Today's Data  . Vitals, CBC, BMP, Lactic acid  Micro Data  . Blood cultures x 2: no growth . Urine culture: No growth  Scheduled Meds: . aspirin  81 mg Oral Daily  . atorvastatin  40 mg Oral QPM  . famotidine  20 mg Oral BID  . gabapentin  200 mg Oral BID  . insulin aspart  0-9 Units Subcutaneous TID WC  . mometasone-formoterol  2 puff Inhalation BID  . venlafaxine  37.5 mg Oral BID   Continuous Infusions: . cefTRIAXone (ROCEPHIN)  IV    . lactated ringers 75 mL/hr at 04/14/20 0605    Principal Problem:   AKI (acute kidney injury) ([place]) Active Problems:   Diabetes mellitus type 2, insulin dependent (HCC)   Syncope   Acute hyponatremia   Dehydration   Diarrhea   Abdominal pain   LOS: 1 day   A & P  Acute kidney injury: Resolved. Presenting serum creatinine noted to be 1.27 relative to most recent prior value of 0.91 when checked on 03/29/2020.  Suspect that this is prerenal in nature in the setting of interval dehydration due to increased GI losses in the form of recent episodes of loose stool as well as vomiting, further complicated by diminished oral intake over the last few days.  Furthermore, there may be an additional pharmacologic complicating factors in the form of the presence of losartan on outpatient medication list.  An element of dehydration is consistent with presenting urinalysis results, which showed the presence of hyaline cast. The patient's elevated creatinine has resolved with IV fluids. Monitor.  Acute hypoosmolar hyporvolemic hyponatremia: Resolved with IV fluids. Present labs reflect serum sodium of 128 without requirement for hyperglycemic adjustment.  This appears to be an acute finding with clinical evidence of systemic  hypovolemia due to increased GI losses and diminished oral intake, with additional exacerbation from persistent use of multiple outpatient diuretics, most notably HCTZ in the context of concomitant hypochloremia. Lasix and HCTZ held for now.  Loose stools: The patient denies any further loose stools since admission. She reports 2-3 episodes of nonbloody loose stools over the last few days, associated with mild abdominal discomfort and intermittent nausea resulting in 1 episode of nonbloody, nonbilious emesis. This is likely due to IBS. The patient was seen by [facility] in 02/2019. At that IBS, mixed type was mentioned. Stool from ED was sent for study and was found to be positive for lactoferrin which would support this diagnosis. Stool was negative for C Diff.   CT abdomen/pelvis performed in ED demonstrated moderate stool burden, but no evidence of acute intra-abdominal process, including no evidence of obstruction, abscess, or bowel perforation.  Syncope: The patient experienced a single witnessed event when attempting to rise from a seated to standing position the urology clinic earlier today.  Suspect that this is on the basis of orthostatic hypotension not only  given the sequence of events, but finding of dehydration in the setting of recent increased GI losses and concomitant decline in oral intake, further confounded by mild loss of compensatory vasoconstriction and tachycardia due to multiple inhibitory outpatient medications, including the presence of beta-blocker, losartan, and verapamil.  ACS is felt to be less likely in the absence of any chest discomfort, negative troponin high values x2, and EKG showing no evidence of acute ischemic changes.  Furthermore, the patient has a documented history of multiple prior syncopal episodes in the setting of orthostatic hypotension. She has received IV fluids overnight with resolution of AKI and hyponatremia. Repeat orthostatic vitals have been ordered as  well as ambulation in the hall. She is being monitored on telemetry. HCTZ and Lasix held.   Urinary tract infection: The patient was started on Rocephin in the ED today in response to urinalysis showing 6-10 white blood cells, many bacteria, and positive nitrates, all in the context of recent dysuria. While 6-10 white blood cells is considered insignificant pyuria for a female, will continue 2 more days of antibiotic coverage in order to complete a 3-day course for uncomplicated UTI given the concomitant presence of urinary symptoms, as further described above.  Of note, SIRS criteria not met for sepsis at this time.  Blood cultures x2 and urine culture were obtained in the ED and have had no growth.  While laboratory evaluation reveals the presence of lactic acidosis, I do not feel that this is on the basis of sepsis, noninfectious differential to explain this elevation as further described below.  Lactic acidosis: Initial lactic acid noted to be 3.6, with interval trend down to 2.8 following. It was 1.2 earlier today. Now back up to 2.9. Will continue IV fluids.  I do not feel that this elevation is on the basis of this criteria are not met qualification of such.  I feel that the elevated lactic acid stems from a contribution from acute kidney injury as well as likely starvation keto lactic acidosis given the patient's reported a significant decline in oral intake over the last 1 to 2 days.  Poor PO intake of fluids: Appears to be inadequate to meet her needs. Will give bolus and continue maintenance fluids overnight. Then stop IV fluids to monitor renal and volume status for 24 hours.  Type 2 diabetes mellitus, complicated by diabetic polyperipheral neuropathy.  To most recent hemoglobin A1c noted to be 6.3% when checked on 03/28/2020.  Outpatient insulin regimen consists of Lantus 12 units subcu nightly.  I do not see any short acting insulin included on the patient's current medication list.  Oral  hypoglycemic agents include Janumet and glipizide.  Additionally, she is on multiple agents for treatment of her diabetic polyperipheral neuropathy, including gabapentin and Lyrica.  Blood sugars for the last 24 hours have been 85 - 190. Basal insulin ha sbeenheld. Glucoses will be followed with FSBS and SSI.  Plan: Hold home basal insulin for now.   I have seen and examined this patient myself. I have spent 35 minutes in her evaluation and care.  DVT prophylaxis: scd's  Code Status: Full code Family Communication: none Disposition Plan:  Status is: Inpatient  Remains inpatient appropriate because:IV treatments appropriate due to intensity of illness or inability to take PO   Dispo: The patient is from: Home              Anticipated d/c is to: Home  Anticipated d/c date is: 1 day              Patient currently is not medically stable to d/c.  Kattia Selley, DO [facility] Direct contact: see [email or web address]  7PM-7AM contact night coverage as above 04/14/2020, 12:07 AM  LOS: 1 day

## 2020-04-14 NOTE — Progress Notes (Signed)
Pt BP 182/81. Sharion Settler notified via epic txt. Will cont to monitor pt.

## 2020-04-14 NOTE — Plan of Care (Signed)

## 2020-04-15 DIAGNOSIS — N179 Acute kidney failure, unspecified: Secondary | ICD-10-CM | POA: Diagnosis not present

## 2020-04-15 LAB — CBC WITH DIFFERENTIAL/PLATELET
Abs Immature Granulocytes: 0.02 10*3/uL (ref 0.00–0.07)
Basophils Absolute: 0 10*3/uL (ref 0.0–0.1)
Basophils Relative: 0 %
Eosinophils Absolute: 0.4 10*3/uL (ref 0.0–0.5)
Eosinophils Relative: 7 %
HCT: 34.7 % — ABNORMAL LOW (ref 36.0–46.0)
Hemoglobin: 11.3 g/dL — ABNORMAL LOW (ref 12.0–15.0)
Immature Granulocytes: 0 %
Lymphocytes Relative: 31 %
Lymphs Abs: 1.9 10*3/uL (ref 0.7–4.0)
MCH: 26.4 pg (ref 26.0–34.0)
MCHC: 32.6 g/dL (ref 30.0–36.0)
MCV: 81.1 fL (ref 80.0–100.0)
Monocytes Absolute: 0.7 10*3/uL (ref 0.1–1.0)
Monocytes Relative: 11 %
Neutro Abs: 3.1 10*3/uL (ref 1.7–7.7)
Neutrophils Relative %: 51 %
Platelets: 223 10*3/uL (ref 150–400)
RBC: 4.28 MIL/uL (ref 3.87–5.11)
RDW: 14.6 % (ref 11.5–15.5)
WBC: 6.2 10*3/uL (ref 4.0–10.5)
nRBC: 0 % (ref 0.0–0.2)

## 2020-04-15 LAB — GLUCOSE, CAPILLARY: Glucose-Capillary: 164 mg/dL — ABNORMAL HIGH (ref 70–99)

## 2020-04-15 LAB — BASIC METABOLIC PANEL
Anion gap: 9 (ref 5–15)
BUN: 12 mg/dL (ref 8–23)
CO2: 28 mmol/L (ref 22–32)
Calcium: 8.8 mg/dL — ABNORMAL LOW (ref 8.9–10.3)
Chloride: 99 mmol/L (ref 98–111)
Creatinine, Ser: 0.69 mg/dL (ref 0.44–1.00)
GFR, Estimated: >60 mL/min (ref >60)
Glucose, Bld: 153 mg/dL — ABNORMAL HIGH (ref 70–99)
Potassium: 3.6 mmol/L (ref 3.5–5.1)
Sodium: 136 mmol/L (ref 135–145)

## 2020-04-15 MED ORDER — AMLODIPINE BESYLATE 10 MG PO TABS: 10.0000 mg | ORAL_TABLET | Freq: Every day | ORAL | Status: DC

## 2020-04-15 MED ORDER — CEPHALEXIN 500 MG PO CAPS: 500.0000 mg | ORAL_CAPSULE | Freq: Three times a day (TID) | ORAL | 0 refills | Status: AC

## 2020-04-15 NOTE — Discharge Summary (Signed)
Physician Discharge Summary  MARIROSE DEVENEY NWG:[id number] DOB: 1936-03-04 DOA: 04/13/2020  PCP: Baxter Hire, MD  Admit date: 04/13/2020 Discharge date: 04/15/2020  Recommendations for Outpatient Follow-up:  Discharge to home Follow up with PCP in 7-10 days. Follow up with [facility] gastroenterology re IBD, positive lactoferrin  Discharge Diagnoses: Principal diagnosis is #1 1. Syncope due to orthostatic hypotension 2. Volume depletion due to poor PO intake, nausea/vomiting, and diarrhea 3. Hyponatremia due to hypovolemia 4. AKI 5. Metabolic acidosis due to volume depletion, GI losses, and AKI 6. UTI 7. DM II  Discharge Condition: The patient is resting comfortably  Disposition: Home  Diet recommendation: Heart healthy/Modified Carbohydrate  Filed Weights   04/13/20 1227 04/14/20 0616 04/15/20 0516  Weight: 86.6 kg 87.2 kg 86.9 kg    History of present illness: Brandi [name] is a 84 y.o. female with medical history significant for type 2 diabetes mellitus associated with peripheral polyneuropathy, orthostatic hypotension, urge incontinence, who is admitted to [facility] on 04/13/2020 with acute kidney injury after presenting directly from outpatient neurology clinic to [facility] Emergency Department for evaluation of syncopal event that occurred while at her urology appointment.   While at urology clinic earlier today for routine follow-up for management of underlying urge incontinence, the patient reportedly experienced a syncopal event when she attempted to rise to a standing position the evaluation table.  The patient denies any associated or preceding chest pain, shortness of breath, palpitations, nausea, vomiting.  This event was witnessed, and reportedly the patient did not hit her head as a component of the fall.  She regained consciousness within a few seconds, and was subsequently brought to emergency department for further evaluation and  management of this syncopal event.  Per chart review, it appears that the patient has a history of prior syncopal events, as well as a documented history of orthostatic hypotension.  She reports intermittent nausea resulting in 1 episode of nonbloody, nonbilious emesis yesterday, as well as mild abdominal pain involving the bilateral lower abdominal quadrants.  She notes that this is been associated with 2-3 episodes of diarrhea over the last day in the absence of any associated melena or hematochezia.  She denies any associated subjective fever, chills, rigors, or generalized myalgias.  Denies any recent traveling or known sick contacts.  Overall, over the last 1 to 2 days, in the setting of intermittent nausea, the patient acknowledges diminished oral intake of food and fluids, but reports that she has continued to take her outpatient antihypertensive and diuretic regimen, which consists of Coreg, Lasix, HCTZ, losartan, and verapamil.   The patient has a documented history of chronic cystitis, and kidneys that she has been experiencing some mild dysuria over the last 3 to 4 days in the absence of any associated gross hematuria or any change in urinary urgency/frequency.  Denies any associated flank discomfort.  Hospital Course: Brandi Wiginton Smithis a 84 y.o.femalewith medical history significant fortype 2 diabetes mellitus associated with peripheral polyneuropathy, orthostatic hypotension, urge incontinence,who is admitted to [facility] on 10/26/2021with acute kidney injuryafter [facility] for evaluation of syncopal event that occurred while at her urology appointment.  While at urology clinic earlier today for routine follow-up for management of underlying urge incontinence, the patient reportedly experienced a syncopal event when she attempted to rise to a standing positionthe evaluation  table.The patient denies any associated or preceding chest pain, shortness of breath, palpitations, nausea,  vomiting.This event was witnessed, and reportedly the patient did not hit her head as a component of the fall. She regained consciousness within a few seconds, and was subsequently brought to emergency department for further evaluation and management of this syncopal event.Per chart review, it appears that the patient has a history of prior syncopal events, as well as a documented history of orthostatic hypotension.  She reports intermittent nausea resulting in 1 episode of nonbloody, nonbilious emesis yesterday, as well as mild abdominal pain involving the bilateral lower abdominal quadrants. She notes that this is been associated with 2-3 episodes of diarrhea over the last day in the absence of any associated melena or hematochezia. She denies any associated subjective fever, chills, rigors, or generalized myalgias. Denies any recent traveling or known sick contacts. Overall, over the last 1 to 2 days, in the setting of intermittent nausea, the patient acknowledges diminished oral intake of food and fluids, but reports that she has continued to take her outpatient antihypertensive and diuretic regimen, which consists of Coreg, Lasix, HCTZ, losartan, and verapamil.  The patient has a documented history of chronic cystitis, and kidneys that she has been experiencing some mild dysuria over the last 3 to 4 days in the absence of any associated gross hematuria or any change in urinary urgency/frequency. Denies any associated flank discomfort.  The patient was admitted to a telemetry bed as inpatient. She has received IV fluids and IV ceftriaxone.   Her metabolic acidosis has resolved with IV fluids. She has ambulated without difficulty. Her stool was positive for lactoferrin, but was otherwise negative.  Today's assessment: S: The patient is resting comfortably. No new complaints.   O: Vitals:  Vitals:   04/15/20 0747 04/15/20 0937  BP: (!) 206/83 (!) 219/84  Pulse: 81 80  Resp: 17 18  Temp: 97.8 F (36.6 C) 98.2 F (36.8 C)  SpO2: 98% 99%   Exam:  Constitutional:   The patient is awake, alert, and oriented x 3. No acute distress. Respiratory:   No increased work of breathing.  No wheezes, rales, or rhonchi  No tactile fremitus Cardiovascular:   Regular rate and rhythm  No murmurs, ectopy, or gallups.  No lateral PMI. No thrills. Abdomen:   Abdomen is soft, non-tender, non-distended  No hernias, masses, or organomegaly  Normoactive bowel sounds.  Musculoskeletal:   No cyanosis, clubbing, or edema Skin:   No rashes, lesions, ulcers  palpation of skin: no induration or nodules Neurologic:   CN 2-12 intact  Sensation all 4 extremities intact Psychiatric:   Mental status ? Mood, affect appropriate ? Orientation to person, place, time   judgment and insight appear intact  Discharge Instructions  Discharge Instructions    Activity as tolerated - No restrictions   Complete by: As directed    Call MD for:  persistant nausea and vomiting   Complete by: As directed    Call MD for:  severe uncontrolled pain   Complete by: As directed    Diet - low sodium heart healthy   Complete by: As directed    Discharge instructions   Complete by: As directed    Discharge to home Follow up with PCP in 7-10 days. Follow up with [facility] gastroenterology re IBD, positive lactoferrin   Increase activity slowly   Complete by: As directed      Allergies as of 04/15/2020      Reactions   Accupril [quinapril Hcl] Rash   Micardis [telmisartan] Other (See Comments)   Dizziness  Quinapril Rash      Medication List    STOP taking these medications   furosemide 20 MG tablet Commonly known as: LASIX   hydrochlorothiazide 25 MG tablet Commonly known as: HYDRODIURIL   sucralfate 1 g tablet Commonly known as: CARAFATE     TAKE  these medications   acetaminophen 325 MG tablet Commonly known as: TYLENOL Take 650 mg by mouth every 6 (six) hours as needed.   albuterol 108 (90 Base) MCG/ACT inhaler Commonly known as: VENTOLIN HFA Inhale 1-2 puffs into the lungs every 6 (six) hours as needed for wheezing or shortness of breath.   Altamist Spray 0.65 % nasal spray Generic drug: sodium chloride Place into the nose.   aspirin 81 MG EC tablet Take 81 mg by mouth daily.   atorvastatin 40 MG tablet Commonly known as: LIPITOR Take 40 mg by mouth every evening.   budesonide-formoterol 80-4.5 MCG/ACT inhaler Commonly known as: SYMBICORT Inhale 2 puffs into the lungs 2 (two) times daily.   CALCIUM + D3 PO Take 1 tablet by mouth daily.   carvedilol 12.5 MG tablet Commonly known as: COREG Take 12.5 mg by mouth 2 (two) times daily.   cephALEXin 500 MG capsule Commonly known as: KEFLEX Take 1 capsule (500 mg total) by mouth 3 (three) times daily for 3 days.   famotidine 20 MG tablet Commonly known as: PEPCID Take 20 mg by mouth 2 (two) times daily.   gabapentin 100 MG capsule Commonly known as: NEURONTIN Take 200 mg by mouth 2 (two) times daily.   Gemtesa 75 MG Tabs Generic drug: Vibegron Take 75 mg by mouth daily.   glipiZIDE 5 MG tablet Commonly known as: GLUCOTROL Take 5 mg by mouth 2 (two) times daily.   Janumet XR 50-1000 MG Tb24 Generic drug: SitaGLIPtin-MetFORMIN HCl Take 1 tablet by mouth 2 (two) times daily.   Lantus SoloStar 100 UNIT/ML Solostar Pen Generic drug: insulin glargine Inject 12 Units into the skin at bedtime.   losartan 100 MG tablet Commonly known as: COZAAR Take 100 mg by mouth daily.   nortriptyline 25 MG capsule Commonly known as: PAMELOR Take 25 mg by mouth at bedtime.   pantoprazole 20 MG tablet Commonly known as: PROTONIX Take 20 mg by mouth 2 (two) times daily.   pregabalin 75 MG capsule Commonly known as: LYRICA Take 1 capsule (75 mg total) by mouth 2 (two)  times daily.   PreserVision/Lutein Caps Take 1 capsule by mouth 2 (two) times daily.   Refresh Tears 0.5 % Soln Generic drug: carboxymethylcellulose Apply 1-2 drops to eye at bedtime.   senna-docusate 8.6-50 MG tablet Commonly known as: Senokot-S Take 1 tablet by mouth at bedtime as needed for mild constipation.   traZODone 50 MG tablet Commonly known as: DESYREL Take 1 tablet (50 mg total) by mouth at bedtime as needed for sleep.   triamcinolone cream 0.5 % Commonly known as: KENALOG Apply 1 application topically 2 (two) times daily.   venlafaxine 37.5 MG tablet Commonly known as: EFFEXOR Take 37.5 mg by mouth 2 (two) times daily.   Verapamil HCl CR 300 MG Cp24 Take 300 capsules by mouth daily.   VITAMIN B-12 PO Take 1 tablet by mouth daily.      Allergies  Allergen Reactions  . Accupril [Quinapril Hcl] Rash  . Micardis [Telmisartan] Other (See Comments)    Dizziness  . Quinapril Rash    The results of significant diagnostics from this hospitalization (including imaging, microbiology, ancillary and laboratory)  are listed below for reference.    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 Loop recorder device present. Stable cardiomediastinal contours. Densely calcified aorta. No focal airspace consolidation, pleural effusion, or pneumothorax. IMPRESSION: No acute cardiopulmonary abnormality. Electronically Signed   By: Davina Poke D.O.   On: 04/13/2020 12:56   CT Head Wo Contrast  Result Date: 03/28/2020 CLINICAL DATA:  Syncope EXAM: CT HEAD WITHOUT CONTRAST TECHNIQUE: Contiguous axial images were obtained from the base of the skull through the vertex without intravenous contrast. COMPARISON:  October 01, 2015 FINDINGS: Brain: No evidence of acute infarction, hemorrhage, hydrocephalus, extra-axial collection or mass  lesion/mass effect. Periventricular white matter hypodensities consistent with sequela of chronic microvascular ischemic disease. Vascular: No hyperdense vessel or unexpected calcification. Skull: Normal. Negative for fracture or focal lesion. Sinuses/Orbits: No acute finding. Other: None. IMPRESSION: No acute intracranial abnormality. Electronically Signed   By: Valentino Saxon MD   On: 03/28/2020 13:39   MR BRAIN WO CONTRAST  Result Date: 03/28/2020 CLINICAL DATA:  Stroke suspected.  Syncopal episode. EXAM: MRI HEAD WITHOUT CONTRAST TECHNIQUE: Multiplanar, multiecho pulse sequences of the brain and surrounding structures were obtained without intravenous contrast. COMPARISON:  CT head without contrast 03/28/2020 FINDINGS: Brain: No acute infarct, hemorrhage, or mass lesion is present. The ventricles are of normal size. Moderate periventricular and subcortical T2 hyperintensities are present bilaterally. Vascular: Flow is present in the major intracranial arteries. Skull and upper cervical spine: The craniocervical junction is normal. Upper cervical spine is within normal limits. Marrow signal is unremarkable. Sinuses/Orbits: Fluid is present in the sphenoid sinuses bilaterally. The paranasal sinuses and mastoid air cells are otherwise clear. Bilateral lens replacements are noted. Globes and orbits are otherwise unremarkable. IMPRESSION: 1. No acute intracranial abnormality. 2. Moderate periventricular and subcortical T2 hyperintensities bilaterally are moderately advanced for age. This likely reflects the sequela of chronic microvascular ischemia. 3. Bilateral sphenoid sinus disease. Electronically Signed   By: San Morelle M.D.   On: 03/28/2020 16:06   CT ABDOMEN PELVIS W CONTRAST  Result Date: 04/13/2020 CLINICAL DATA:  84 year old female with history of acute onset of non localizable abdominal pain. EXAM: CT ABDOMEN AND PELVIS WITH CONTRAST TECHNIQUE: Multidetector CT imaging of the abdomen  and pelvis was performed using the standard protocol following bolus administration of intravenous contrast. CONTRAST:  67mL OMNIPAQUE IOHEXOL 300 MG/ML  SOLN COMPARISON:  CT the abdomen and pelvis 03/28/2020. FINDINGS: Lower chest: Atherosclerotic calcifications in the thoracic aorta as well as the left circumflex and right coronary arteries. Severe calcifications of the aortic valve and mitral annulus. Median sternotomy wires. Hepatobiliary: No suspicious cystic or solid hepatic lesions. Status post cholecystectomy. Very mild intra and extrahepatic biliary ductal dilatation, similar to the prior study, favored to reflect benign post cholecystectomy physiology. Pancreas: Diffuse pancreatic atrophy. No pancreatic mass. No pancreatic ductal dilatation. No pancreatic or peripancreatic fluid collections or inflammatory changes. Spleen: Unremarkable. Adrenals/Urinary Tract: Low-attenuation lesions in the right kidney compatible with simple cysts, largest of which is in the upper pole measuring 2.5 x 2.2 cm. Left kidney and bilateral adrenal glands are normal in appearance. No hydroureteronephrosis. Urinary bladder is normal in appearance. Stomach/Bowel: Normal appearance of the stomach. No pathologic dilatation of small bowel or colon. Moderate volume of stool throughout the colon. Normal appendix. Vascular/Lymphatic: Aortic atherosclerosis, without evidence of aneurysm or dissection in the abdominal or  pelvic vasculature. No lymphadenopathy noted in the abdomen or pelvis. Reproductive: Uterus and ovaries are unremarkable in appearance. Other: No significant volume of ascites.  No pneumoperitoneum. Musculoskeletal: Old healed fractures of the right superior and inferior pubic rami. There are no aggressive appearing lytic or blastic lesions noted in the visualized portions of the skeleton. IMPRESSION: 1. No acute findings are noted in the abdomen or pelvis. 2. Moderate stool burden throughout the colon which may suggest  constipation. 3. Aortic atherosclerosis, in addition to at least right coronary artery disease. 4. There are calcifications of the aortic valve and mitral annulus. Echocardiographic correlation for evaluation of potential valvular dysfunction may be warranted if clinically indicated. 5. Additional incidental findings, as above. Electronically Signed   By: Vinnie Langton M.D.   On: 04/13/2020 15:18   CT ABDOMEN PELVIS W CONTRAST  Result Date: 03/28/2020 CLINICAL DATA:  Epigastric pain EXAM: CT ABDOMEN AND PELVIS WITH CONTRAST TECHNIQUE: Multidetector CT imaging of the abdomen and pelvis was performed using the standard protocol following bolus administration of intravenous contrast. CONTRAST:  34mL OMNIPAQUE IOHEXOL 300 MG/ML  SOLN COMPARISON:  February 18, 2011. April 14, 2016 FINDINGS: Lower chest: RIGHT lower lobe pulmonary nodule measures 2 mm, unchanged since 2017 and thereby benign (series 2, image 7). Mild centrilobular nodularity at the RIGHT lung base. Three-vessel coronary artery atherosclerotic calcifications. Loop recorder. Atherosclerotic calcifications of the thoracic aorta. Hepatobiliary: Status post cholecystectomy. There is mild central biliary ductal dilation. Common bile duct measures 9 mm. Enhancing 7 mm mass of the LEFT liver (series 2, image 26). Pancreas: Atrophic pancreas.  No peripancreatic fat stranding. Spleen: Normal in size without focal abnormality.  Splenule. Adrenals/Urinary Tract: Adrenals are unremarkable. RIGHT-sided renal cysts. No hydronephrosis. Kidneys enhance symmetrically. Bladder is decompressed. Stomach/Bowel: Small hiatal hernia. The esophagus is partially fluid-filled. High density material within the stomach likely reflecting enteric debris. No evidence of bowel obstruction. There is a likely internal mesenteric defect within the RIGHT mid abdomen given swirling appearance of the mesentery at this location (series 5, image 30). It contains cecum and redundant  sigmoid colon. The cecum is located in the RIGHT upper quadrant. Appendix is normal. There is a large colonic stool burden throughout the RIGHT colon and transverse colon Vascular/Lymphatic: Severe atherosclerotic calcifications. No suspicious lymphadenopathy. Reproductive: Uterus and bilateral adnexa are unremarkable. Other: Nonobstructive small bowel containing LEFT inguinal hernia. Musculoskeletal: Remote RIGHT pelvic fracture. IMPRESSION: 1. Large colonic stool burden throughout the RIGHT colon and transverse colon. 2. There is a likely internal nonobstructed mesenteric defect within the RIGHT mid abdomen given swirling appearance of the mesentery at this location. It contains stool filled RIGHT colon and redundant sigmoid colon. No evidence of bowel obstruction. This may be contributing to patient constipation. 3. Nonobstructive small bowel containing LEFT inguinal hernia. 4. Enhancing 7 mm mass of the LEFT liver. In the absence of known malignancy, this likely represents a flash filling hemangioma. This could be further evaluated with dedicated three-phase CT or hepatic MRI if clinically indicated. 5. Mild central biliary ductal dilation, likely related to prior cholecystectomy. Correlate with liver function tests. 6. Mild centrilobular nodularity at the RIGHT lung base which is favored to reflect sequela of aspiration given fluid within the esophagus and small hiatal hernia. Aortic Atherosclerosis (ICD10-I70.0). Electronically Signed   By: Valentino Saxon MD   On: 03/28/2020 14:04    Microbiology: Recent Results (from the past 240 hour(s))  Respiratory Panel by RT PCR (Flu A&B, Covid) - Nasopharyngeal Swab  Status: None   Collection Time: 04/13/20 12:29 PM   Specimen: Nasopharyngeal Swab  Result Value Ref Range Status   SARS Coronavirus 2 by RT PCR NEGATIVE NEGATIVE Final    Comment: (NOTE) SARS-CoV-2 target nucleic acids are NOT DETECTED.  The SARS-CoV-2 RNA is generally detectable in  upper respiratoy specimens during the acute phase of infection. The lowest concentration of SARS-CoV-2 viral copies this assay can detect is 131 copies/mL. A negative result does not preclude SARS-Cov-2 infection and should not be used as the sole basis for treatment or other patient management decisions. A negative result may occur with  improper specimen collection/handling, submission of specimen other than nasopharyngeal swab, presence of viral mutation(s) within the areas targeted by this assay, and inadequate number of viral copies (<131 copies/mL). A negative result must be combined with clinical observations, patient history, and epidemiological information. The expected result is Negative.  Fact Sheet for Patients:  PinkCheek.be  Fact Sheet for Healthcare Providers:  GravelBags.it  This test is no t yet approved or cleared by the [place] FDA and  has been authorized for detection and/or diagnosis of SARS-CoV-2 by FDA under an Emergency Use Authorization (EUA). This EUA will remain  in effect (meaning this test can be used) for the duration of the COVID-19 declaration under Section 564(b)(1) of the Act, 21 U.S.C. section 360bbb-3(b)(1), unless the authorization is terminated or revoked sooner.     Influenza A by PCR NEGATIVE NEGATIVE Final   Influenza B by PCR NEGATIVE NEGATIVE Final    Comment: (NOTE) The Xpert Xpress SARS-CoV-2/FLU/RSV assay is intended as an aid in  the diagnosis of influenza from Nasopharyngeal swab specimens and  should not be used as a sole basis for treatment. Nasal washings and  aspirates are unacceptable for Xpert Xpress SARS-CoV-2/FLU/RSV  testing.  Fact Sheet for Patients: PinkCheek.be  Fact Sheet for Healthcare Providers: GravelBags.it  This test is not yet approved or cleared by the [place] FDA and  has been  authorized for detection and/or diagnosis of SARS-CoV-2 by  FDA under an Emergency Use Authorization (EUA). This EUA will remain  in effect (meaning this test can be used) for the duration of the  Covid-19 declaration under Section 564(b)(1) of the Act, 21  U.S.C. section 360bbb-3(b)(1), unless the authorization is  terminated or revoked. Performed at [facility], [address] [place], [place] [address]   Blood culture (routine x 2)     Status: None (Preliminary result)   Collection Time: 04/13/20  2:00 PM   Specimen: BLOOD  Result Value Ref Range Status   Specimen Description BLOOD LEFT ANTECUBITAL  Final   Special Requests   Final    BOTTLES DRAWN AEROBIC AND ANAEROBIC Blood Culture adequate volume   Culture   Final    NO GROWTH 2 DAYS Performed at [facility], [address] [place], [place] [address]    Report Status PENDING  Incomplete  Blood culture (routine x 2)     Status: None (Preliminary result)   Collection Time: 04/13/20  2:28 PM   Specimen: BLOOD  Result Value Ref Range Status   Specimen Description BLOOD BLOOD LEFT FOREARM  Final   Special Requests   Final    BOTTLES DRAWN AEROBIC AND ANAEROBIC Blood Culture adequate volume   Culture   Final    NO GROWTH 2 DAYS Performed at [facility], [address] [place], [place] [address]    Report Status PENDING  Incomplete  Urine Culture     Status: Abnormal (Preliminary result)   Collection Time: 04/13/20  2:28 PM   Specimen: Urine, Random  Result Value Ref Range Status   Specimen Description   Final    URINE, RANDOM Performed at [facility], [address] [place], [place] [address]    Special Requests   Final    NONE Performed at [facility], [address] [place], [place] [address]    Culture (A)  Final    >=100,000 COLONIES/mL ESCHERICHIA COLI SUSCEPTIBILITIES TO FOLLOW Performed at [facility], [place] [address] [place], [place] [address]     Report Status PENDING  Incomplete  Gastrointestinal Panel by PCR , Stool     Status: None   Collection Time: 04/13/20  6:38 PM   Specimen: Stool  Result Value Ref Range Status   Campylobacter species NOT DETECTED NOT DETECTED Final   Plesimonas shigelloides NOT DETECTED NOT DETECTED Final   Salmonella species NOT DETECTED NOT DETECTED Final   Yersinia enterocolitica NOT DETECTED NOT DETECTED Final   Vibrio species NOT DETECTED NOT DETECTED Final   Vibrio cholerae NOT DETECTED NOT DETECTED Final   Enteroaggregative E coli (EAEC) NOT DETECTED NOT DETECTED Final   Enteropathogenic E coli (EPEC) NOT DETECTED NOT DETECTED Final   Enterotoxigenic E coli (ETEC) NOT DETECTED NOT DETECTED Final   Shiga like toxin producing E coli (STEC) NOT DETECTED NOT DETECTED Final   Shigella/Enteroinvasive E coli (EIEC) NOT DETECTED NOT DETECTED Final   Cryptosporidium NOT DETECTED NOT DETECTED Final   Cyclospora cayetanensis NOT DETECTED NOT DETECTED Final   Entamoeba histolytica NOT DETECTED NOT DETECTED Final   Giardia lamblia NOT DETECTED NOT DETECTED Final   Adenovirus F40/41 NOT DETECTED NOT DETECTED Final   Astrovirus NOT DETECTED NOT DETECTED Final   Norovirus GI/GII NOT DETECTED NOT DETECTED Final   Rotavirus A NOT DETECTED NOT DETECTED Final   Sapovirus (I, II, IV, and V) NOT DETECTED NOT DETECTED Final    Comment: Performed at [facility], [address] [place], [place] [address]  C Difficile Quick Screen w PCR reflex     Status: None   Collection Time: 04/13/20  6:38 PM   Specimen: STOOL  Result Value Ref Range Status   C Diff antigen NEGATIVE NEGATIVE Final   C Diff toxin NEGATIVE NEGATIVE Final   C Diff interpretation No C. difficile detected.  Final    Comment: Performed at [facility], [address] [place], [place] [address]     Labs: Basic Metabolic Panel: Recent Labs  Lab 04/13/20 1229 04/14/20 0431 04/15/20 0540  NA 128* 136 136  K 3.7 3.7 3.6    CL 93* 99 99  CO2 23 27 28   GLUCOSE 172* 86 153*  BUN 22 16 12   CREATININE 1.27* 0.88 0.69  CALCIUM 9.0 9.0 8.8*  MG 2.0 1.9  --    Liver Function Tests: Recent Labs  Lab 04/13/20 1229 04/14/20 0431  AST 23 17  ALT 20 17  ALKPHOS 49 44  BILITOT 0.6 0.5  PROT 6.7 5.8*  ALBUMIN 3.5 3.0*   Recent Labs  Lab 04/13/20 1229  LIPASE 20   No results for input(s): AMMONIA in the last 168 hours. CBC: Recent Labs  Lab 04/13/20 1229 04/14/20 0431 04/15/20 0540  WBC 8.2 8.0 6.2  NEUTROABS 5.1 5.6 3.1  HGB 12.4 11.8* 11.3*  HCT 39.0 35.3* 34.7*  MCV 80.9 79.9* 81.1  PLT 275 237  223   Cardiac Enzymes: No results for input(s): CKTOTAL, CKMB, CKMBINDEX, TROPONINI in the last 168 hours. BNP: BNP (last 3 results) No results for input(s): BNP in the last 8760 hours.  ProBNP (last 3 results) No results for input(s): PROBNP in the last 8760 hours.  CBG: Recent Labs  Lab 04/14/20 0749 04/14/20 1151 04/14/20 1645 04/14/20 2126 04/15/20 0743  GLUCAP 85 190* 191* 216* 164*    Principal Problem:   AKI (acute kidney injury) ([place]) Active Problems:   Diabetes mellitus type 2, insulin dependent (HCC)   Syncope   Acute hyponatremia   Dehydration   Diarrhea   Abdominal pain   Time coordinating discharge: 38 minutes.  Signed:        Chalice Philbert, DO [facility]  04/15/2020, 4:19 PM

## 2020-04-15 NOTE — Progress Notes (Signed)
   04/15/20 0937  Assess: MEWS Score  Temp 98.2 F (36.8 C)  BP (!) 219/84  Pulse Rate 80  Resp 18  SpO2 99 %  O2 Device Room Air  Assess: MEWS Score  MEWS Temp 0  MEWS Systolic 2  MEWS Pulse 0  MEWS RR 0  MEWS LOC 0  MEWS Score 2  MEWS Score Color Yellow  Assess: if the MEWS score is Yellow or Red  Were vital signs taken at a resting state? Yes  Focused Assessment Change from prior assessment (see assessment flowsheet)  Early Detection of Sepsis Score *See Row Information* Low  MEWS guidelines implemented *See Row Information* Yes  Treat  MEWS Interventions Administered prn meds/treatments  Pain Scale 0-10  Take Vital Signs  Increase Vital Sign Frequency  Yellow: Q 2hr X 2 then Q 4hr X 2, if remains yellow, continue Q 4hrs  Escalate  MEWS: Escalate Yellow: discuss with charge nurse/RN and consider discussing with provider and RRT  Notify: Charge Nurse/RN  Name of Charge Nurse/RN Notified Wilder Glade  Date Charge Nurse/RN Notified 04/15/20  Time Charge Nurse/RN Notified [phone]  Notify: Provider  Provider Name/Title Swayze  Date Provider Notified 04/15/20  Time Provider Notified [phone]  Notification Type  (secure chat)  Notification Reason Change in status  Document  Progress note created (see row info) Yes

## 2020-04-15 NOTE — Progress Notes (Signed)
Patient is discharged to home this morning.  IV's removed, belongings packed and DC & Rx instructions given and patient acknowledged understanding. Son is here to take patient home.

## 2020-04-16 LAB — URINE CULTURE: Culture: >=100000 — AB

## 2020-04-18 LAB — CULTURE, BLOOD (ROUTINE X 2)
Culture: NO GROWTH
Culture: NO GROWTH
Special Requests: ADEQUATE
Special Requests: ADEQUATE

## 2020-04-20 LAB — O&P RESULT

## 2020-04-20 LAB — OVA + PARASITE EXAM

## 2020-04-21 NOTE — Progress Notes (Signed)
04/22/2020 12:30 PM   Brandi [name] Aug 05, 1935 [id number]  Referring provider: Baxter Hire, MD [place],  [place] [address]  Chief Complaint  Patient presents with  . Follow-up    HPI: 84 yo female with history of recurrent UTI's, incontinence, vaginal atrophy, cystocele and nocturia presents for follow up after a trial of Gemtesa 75 mg daily.    Recurrent UTI's Her risk factor for recurrent UTI's vaginal atrophy, incontinence and age.  She had a positive urine culture for pansensitive E. coli in September 2021, but she was not having symptoms at the time.  Her UA looked abnormal during a routine follow-up, so that is why it was sent for culture.    She was hospitalized recently for an episode of loss of consciousness while she was here for her follow-up visit. Her urine culture was positive for E. coli during the hospitalization, but blood cultures were negative.  Vaginal atrophy She is not using the cream.     Incontinence She states that the Gemtesa samples that I gave her did help with her urinary symptoms, but she had two UTIs while on the medication. I do not know what she is referring to as she has only had one documented positive culture and that was during her hospitalization. When I asked her what her symptoms were, she stated UTI was listed in the side effects of the medication and she does not want to take it.  Nocturia Patient is using her CPAP machine.    PMH: Past Medical History:  Diagnosis Date  . Anemia   . Anxiety   . Arthritis, degenerative   . Chronic cystitis   . Depression   . Diabetes mellitus without complication ([place])   . GERD (gastroesophageal reflux disease)   . Headache   . Heart disease   . HLD (hyperlipidemia)   . HTN (hypertension)   . Hypertension   . Incontinence in female   . Murmur   . Obesity   . Sleep apnea   . Urge incontinence of urine     Surgical History: Past Surgical History:  Procedure  Laterality Date  . BREAST BIOPSY Right 2004   neg  . BUNIONECTOMY    . CARDIAC SURGERY     Stent  . CHOLECYSTECTOMY    . COLONOSCOPY WITH PROPOFOL N/A 06/14/2017   Procedure: COLONOSCOPY WITH PROPOFOL;  Surgeon: Lollie Sails, MD;  Location: [facility] ENDOSCOPY;  Service: Endoscopy;  Laterality: N/A;  . JOINT REPLACEMENT     TKR Bilateral  . LEFT HEART CATH AND CORONARY ANGIOGRAPHY N/A 05/22/2018   Procedure: LEFT HEART CATH AND CORONARY ANGIOGRAPHY;  Surgeon: Corey Skains, MD;  Location: [place] CV LAB;  Service: Cardiovascular;  Laterality: N/A;  . LOOP RECORDER INSERTION N/A 09/20/2016   Procedure: Loop Recorder Insertion;  Surgeon: Isaias Cowman, MD;  Location: [place] CV LAB;  Service: Cardiovascular;  Laterality: N/A;  . SEPTOPLASTY      Home Medications:  Allergies as of 04/22/2020      Reactions   Accupril [quinapril Hcl] Rash   Micardis [telmisartan] Other (See Comments)   Dizziness   Quinapril Rash      Medication List       Accurate as of April 22, 2020 12:30 PM. If you have any questions, ask your nurse or doctor.        STOP taking these medications   Gemtesa 75 MG Tabs Generic drug: Vibegron Stopped by: Zara Council,  PA-C     TAKE these medications   acetaminophen 325 MG tablet Commonly known as: TYLENOL Take 650 mg by mouth every 6 (six) hours as needed.   albuterol 108 (90 Base) MCG/ACT inhaler Commonly known as: VENTOLIN HFA Inhale 1-2 puffs into the lungs every 6 (six) hours as needed for wheezing or shortness of breath.   Altamist Spray 0.65 % nasal spray Generic drug: sodium chloride Place into the nose.   aspirin 81 MG EC tablet Take 81 mg by mouth daily.   atorvastatin 40 MG tablet Commonly known as: LIPITOR Take 40 mg by mouth every evening.   budesonide-formoterol 80-4.5 MCG/ACT inhaler Commonly known as: SYMBICORT Inhale 2 puffs into the lungs 2 (two) times daily.   CALCIUM + D3 PO Take 1 tablet by mouth  daily.   carvedilol 12.5 MG tablet Commonly known as: COREG Take 12.5 mg by mouth 2 (two) times daily.   famotidine 20 MG tablet Commonly known as: PEPCID Take 20 mg by mouth 2 (two) times daily.   gabapentin 100 MG capsule Commonly known as: NEURONTIN Take 200 mg by mouth 2 (two) times daily.   glipiZIDE 5 MG tablet Commonly known as: GLUCOTROL Take 5 mg by mouth 2 (two) times daily.   Janumet XR 50-1000 MG Tb24 Generic drug: SitaGLIPtin-MetFORMIN HCl Take 1 tablet by mouth 2 (two) times daily.   Lantus SoloStar 100 UNIT/ML Solostar Pen Generic drug: insulin glargine Inject 12 Units into the skin at bedtime.   losartan 100 MG tablet Commonly known as: COZAAR Take 100 mg by mouth daily.   mirabegron ER 25 MG Tb24 tablet Commonly known as: MYRBETRIQ Take 1 tablet (25 mg total) by mouth daily. Started by: Zara Council, PA-C   nortriptyline 25 MG capsule Commonly known as: PAMELOR Take 25 mg by mouth at bedtime.   pantoprazole 20 MG tablet Commonly known as: PROTONIX Take 20 mg by mouth 2 (two) times daily.   pregabalin 75 MG capsule Commonly known as: LYRICA Take 1 capsule (75 mg total) by mouth 2 (two) times daily.   PreserVision/Lutein Caps Take 1 capsule by mouth 2 (two) times daily.   Refresh Tears 0.5 % Soln Generic drug: carboxymethylcellulose Apply 1-2 drops to eye at bedtime.   senna-docusate 8.6-50 MG tablet Commonly known as: Senokot-S Take 1 tablet by mouth at bedtime as needed for mild constipation.   traZODone 50 MG tablet Commonly known as: DESYREL Take 1 tablet (50 mg total) by mouth at bedtime as needed for sleep.   triamcinolone cream 0.5 % Commonly known as: KENALOG Apply 1 application topically 2 (two) times daily.   venlafaxine 37.5 MG tablet Commonly known as: EFFEXOR Take 37.5 mg by mouth 2 (two) times daily.   Verapamil HCl CR 300 MG Cp24 Take 300 capsules by mouth daily.   VITAMIN B-12 PO Take 1 tablet by mouth  daily.       Allergies:  Allergies  Allergen Reactions  . Accupril [Quinapril Hcl] Rash  . Micardis [Telmisartan] Other (See Comments)    Dizziness  . Quinapril Rash    Family History: Family History  Problem Relation Age of Onset  . Breast cancer Cousin   . Skin cancer Other   . Diabetes Other   . Hypercholesterolemia Other   . Hypertension Other   . Bladder Cancer Maternal Aunt   . COPD Other   . Kidney disease Neg Hx   . Kidney cancer Neg Hx     Social History:  reports  that she has never smoked. She has never used smokeless tobacco. She reports that she does not drink alcohol and does not use drugs.  ROS: For pertinent review of systems please refer to history of present illness  Physical Exam: BP (!) 173/77   Pulse 92   Constitutional:  Well nourished. Alert and oriented, No acute distress. HEENT: [place] AT, mask in place.  Trachea midline Cardiovascular: No clubbing, cyanosis, or edema. Respiratory: Normal respiratory effort, no increased work of breathing. Neurologic: Grossly intact, no focal deficits, moving all 4 extremities. Psychiatric: Normal mood and affect.   Laboratory Data: Lab Results  Component Value Date   WBC 6.2 04/15/2020   HGB 11.3 (L) 04/15/2020   HCT 34.7 (L) 04/15/2020   MCV 81.1 04/15/2020   PLT 223 04/15/2020   Lab Results  Component Value Date   CREATININE 0.69 04/15/2020   Lab Results  Component Value Date   HGBA1C 6.3 (H) 03/28/2020      Component Value Date/Time   CHOL 141 06/30/2011 0551   HDL 63 (H) 06/30/2011 0551   VLDL 14 06/30/2011 0551   LDLCALC 64 06/30/2011 0551   Lab Results  Component Value Date   AST 17 04/14/2020   Lab Results  Component Value Date   ALT 17 04/14/2020   Specimen:  Urine  Ref Range & Units 13 d ago  Color Yellow  Yellow      Clarity Clear  SL CloudyAbnormal      Specific Gravity 1.000 - 1.030  <=1.005      pH, Urine 5.0 - 8.0  7.0      Protein, Urinalysis Negative, Trace  mg/dL Negative      Glucose, Urinalysis Negative mg/dL Negative      Ketones, Urinalysis Negative mg/dL Negative      Blood, Urinalysis Negative  Negative      Nitrite, Urinalysis Negative  PositiveAbnormal      Leukocyte Esterase, Urinalysis Negative  LargeAbnormal      White Blood Cells, Urinalysis None Seen, 0-3 /hpf 10-50Abnormal      Red Blood Cells, Urinalysis None Seen, 0-3 /hpf 0-3      Bacteria, Urinalysis None Seen /hpf ManyAbnormal      Squamous Epithelial Cells, Urinalysis Rare, Few, None Seen /hpf Rare      Resulting Agency  [facility]  Specimen Collected: 03/10/20 11:54 AM Last Resulted: 03/10/20 1:42 PM  Received From: [facility]  Result Received: 03/12/20 2:13 PM    I have reviewed the labs.  Pertinent Imaging Results for Brandi [name], Brandi [name] (MRN [id number]) as of 04/22/2020 12:31  Ref. Range 04/22/2020 11:20  Scan Result Unknown 5      Assessment & Plan:    1. History of recurrent UTI's Asymptomatic at this visit Asked to contact office for symptoms of UTI's Suspect that she actually may be colonized as she states that she is told that she has an infection rather than her reporting symptoms We will continue to monitor   2. Vaginal atrophy Not applying the vaginal estrogen cream   3. Incontinence She is really wanting to continue the Myrbetriq as she felt that this was the most effective in controlling her urinary symptoms. I reached out to her local pharmacy and they stated that she had not filled the prescription of Myrbetriq since the summer, but it was covered at that time. I will go ahead and submit another prescription for the Myrbetriq 25 mg daily and contact the  pharmacy in a couple of days to see if it is covered by her insurance and whether or not she can continue that medication. I have given her Myrbetriq 25 mg, #28 samples daily to take in the interim.    Return for Pending insurance to inquiry  regarding her Myrbetriq prescription.  These notes generated with voice recognition software. I apologize for typographical errors.  Zara Council, PA-C  [facility] [address] [place], [place] [address] [phone]  I spent 30 minutes on the day of the encounter to include pre-visit record review, face-to-face time with the patient, and post-visit ordering of tests.

## 2020-04-22 ENCOUNTER — Ambulatory Visit (INDEPENDENT_AMBULATORY_CARE_PROVIDER_SITE_OTHER): Payer: Medicare PPO | Admitting: Urology

## 2020-04-22 ENCOUNTER — Other Ambulatory Visit: Payer: Self-pay

## 2020-04-22 ENCOUNTER — Encounter: Payer: Self-pay | Admitting: Urology

## 2020-04-22 ENCOUNTER — Other Ambulatory Visit: Payer: Self-pay | Admitting: Urology

## 2020-04-22 VITALS — BP 173/77 | HR 92

## 2020-04-22 DIAGNOSIS — N3946 Mixed incontinence: Secondary | ICD-10-CM

## 2020-04-22 LAB — BLADDER SCAN AMB NON-IMAGING: Scan Result: 5

## 2020-04-22 MED ORDER — MIRABEGRON ER 25 MG PO TB24: 25.0000 mg | ORAL_TABLET | Freq: Every day | ORAL | 3 refills | Status: DC

## 2020-04-22 MED ORDER — MIRABEGRON ER 25 MG PO TB24: 25.0000 mg | ORAL_TABLET | Freq: Every day | ORAL | 0 refills | Status: DC

## 2020-05-05 ENCOUNTER — Other Ambulatory Visit: Payer: Self-pay

## 2020-05-05 ENCOUNTER — Ambulatory Visit
Admission: RE | Admit: 2020-05-05 | Discharge: 2020-05-05 | Disposition: A | Discharge status: Home / Self Care | Payer: Medicare PPO | Source: Ambulatory Visit | Attending: Internal Medicine | Admitting: Internal Medicine

## 2020-05-05 DIAGNOSIS — Z1231 Encounter for screening mammogram for malignant neoplasm of breast: Secondary | ICD-10-CM | POA: Diagnosis not present

## 2020-10-13 ENCOUNTER — Emergency Department: Payer: Medicare PPO

## 2020-10-13 ENCOUNTER — Inpatient Hospital Stay
Admission: EM | Admit: 2020-10-13 | Discharge: 2020-10-15 | DRG: 352 | Disposition: A | Discharge status: Home Health | Payer: Medicare PPO | Attending: Surgery | Admitting: Surgery

## 2020-10-13 ENCOUNTER — Other Ambulatory Visit: Payer: Self-pay

## 2020-10-13 ENCOUNTER — Encounter: Payer: Self-pay | Admitting: *Deleted

## 2020-10-13 DIAGNOSIS — Z888 Allergy status to other drugs, medicaments and biological substances status: Secondary | ICD-10-CM

## 2020-10-13 DIAGNOSIS — I10 Essential (primary) hypertension: Secondary | ICD-10-CM | POA: Diagnosis present

## 2020-10-13 DIAGNOSIS — E1140 Type 2 diabetes mellitus with diabetic neuropathy, unspecified: Secondary | ICD-10-CM | POA: Diagnosis present

## 2020-10-13 DIAGNOSIS — E785 Hyperlipidemia, unspecified: Secondary | ICD-10-CM | POA: Diagnosis present

## 2020-10-13 DIAGNOSIS — M1990 Unspecified osteoarthritis, unspecified site: Secondary | ICD-10-CM | POA: Diagnosis present

## 2020-10-13 DIAGNOSIS — Z833 Family history of diabetes mellitus: Secondary | ICD-10-CM

## 2020-10-13 DIAGNOSIS — Z7951 Long term (current) use of inhaled steroids: Secondary | ICD-10-CM

## 2020-10-13 DIAGNOSIS — Z794 Long term (current) use of insulin: Secondary | ICD-10-CM

## 2020-10-13 DIAGNOSIS — Z808 Family history of malignant neoplasm of other organs or systems: Secondary | ICD-10-CM

## 2020-10-13 DIAGNOSIS — I2510 Atherosclerotic heart disease of native coronary artery without angina pectoris: Secondary | ICD-10-CM | POA: Diagnosis present

## 2020-10-13 DIAGNOSIS — G4730 Sleep apnea, unspecified: Secondary | ICD-10-CM | POA: Diagnosis present

## 2020-10-13 DIAGNOSIS — Z96653 Presence of artificial knee joint, bilateral: Secondary | ICD-10-CM | POA: Diagnosis present

## 2020-10-13 DIAGNOSIS — K56609 Unspecified intestinal obstruction, unspecified as to partial versus complete obstruction: Secondary | ICD-10-CM

## 2020-10-13 DIAGNOSIS — Z803 Family history of malignant neoplasm of breast: Secondary | ICD-10-CM

## 2020-10-13 DIAGNOSIS — Z7982 Long term (current) use of aspirin: Secondary | ICD-10-CM

## 2020-10-13 DIAGNOSIS — K219 Gastro-esophageal reflux disease without esophagitis: Secondary | ICD-10-CM | POA: Diagnosis present

## 2020-10-13 DIAGNOSIS — K4030 Unilateral inguinal hernia, with obstruction, without gangrene, not specified as recurrent: Secondary | ICD-10-CM | POA: Diagnosis not present

## 2020-10-13 DIAGNOSIS — R55 Syncope and collapse: Secondary | ICD-10-CM | POA: Diagnosis not present

## 2020-10-13 DIAGNOSIS — Z8249 Family history of ischemic heart disease and other diseases of the circulatory system: Secondary | ICD-10-CM

## 2020-10-13 DIAGNOSIS — Z79899 Other long term (current) drug therapy: Secondary | ICD-10-CM

## 2020-10-13 DIAGNOSIS — Z8052 Family history of malignant neoplasm of bladder: Secondary | ICD-10-CM

## 2020-10-13 DIAGNOSIS — Z7984 Long term (current) use of oral hypoglycemic drugs: Secondary | ICD-10-CM

## 2020-10-13 DIAGNOSIS — Z20822 Contact with and (suspected) exposure to covid-19: Secondary | ICD-10-CM | POA: Diagnosis present

## 2020-10-13 LAB — BASIC METABOLIC PANEL
Anion gap: 11 (ref 5–15)
BUN: 15 mg/dL (ref 8–23)
CO2: 25 mmol/L (ref 22–32)
Calcium: 9.9 mg/dL (ref 8.9–10.3)
Chloride: 97 mmol/L — ABNORMAL LOW (ref 98–111)
Creatinine, Ser: 1.03 mg/dL — ABNORMAL HIGH (ref 0.44–1.00)
GFR, Estimated: 54 mL/min — ABNORMAL LOW (ref >60)
Glucose, Bld: 195 mg/dL — ABNORMAL HIGH (ref 70–99)
Potassium: 4 mmol/L (ref 3.5–5.1)
Sodium: 133 mmol/L — ABNORMAL LOW (ref 135–145)

## 2020-10-13 LAB — CBC
HCT: 47.2 % — ABNORMAL HIGH (ref 36.0–46.0)
Hemoglobin: 15.2 g/dL — ABNORMAL HIGH (ref 12.0–15.0)
MCH: 27 pg (ref 26.0–34.0)
MCHC: 32.2 g/dL (ref 30.0–36.0)
MCV: 84 fL (ref 80.0–100.0)
Platelets: 263 10*3/uL (ref 150–400)
RBC: 5.62 MIL/uL — ABNORMAL HIGH (ref 3.87–5.11)
RDW: 14.8 % (ref 11.5–15.5)
WBC: 12.7 10*3/uL — ABNORMAL HIGH (ref 4.0–10.5)
nRBC: 0 % (ref 0.0–0.2)

## 2020-10-13 LAB — TROPONIN I (HIGH SENSITIVITY): Troponin I (High Sensitivity): 5 ng/L (ref <18)

## 2020-10-13 MED ORDER — ONDANSETRON HCL 4 MG/2ML IJ SOLN: 4.0000 mg | Freq: Once | INTRAMUSCULAR | Status: AC

## 2020-10-13 MED ORDER — LACTATED RINGERS IV BOLUS
1000.0000 mL | Freq: Once | INTRAVENOUS | Status: AC
  Administered 2020-10-13: 1000 mL via INTRAVENOUS

## 2020-10-13 MED ORDER — ONDANSETRON HCL 4 MG/2ML IJ SOLN
INTRAMUSCULAR | Status: AC
  Administered 2020-10-13: 4 mg via INTRAVENOUS
  Filled 2020-10-13: qty 2

## 2020-10-13 MED ORDER — IOHEXOL 300 MG/ML  SOLN
100.0000 mL | Freq: Once | INTRAMUSCULAR | Status: AC | PRN
  Administered 2020-10-13: 100 mL via INTRAVENOUS

## 2020-10-13 MED ORDER — MORPHINE SULFATE (PF) 4 MG/ML IV SOLN
4.0000 mg | Freq: Once | INTRAVENOUS | Status: AC
Start: 2020-10-13 — End: 2020-10-13
  Administered 2020-10-13: 4 mg via INTRAVENOUS
  Filled 2020-10-13: qty 1

## 2020-10-13 NOTE — ED Notes (Signed)
On arrival to er pt vomiting.  No headache.  No chest pain.  No sob.  Pt had syncopal episode on the toilet tonight.  Pt did not fall.  On arrival to er, pt alert.  nsr on monitor.  Skin warm and dry.

## 2020-10-13 NOTE — ED Provider Notes (Signed)
[facility] Emergency Department Provider Note ____________________________________________   Event Date/Time   First MD Initiated Contact with Patient 10/13/20 2151     (approximate)  I have reviewed the triage vital signs and the nursing notes.  HISTORY  Chief Complaint Loss of Consciousness   HPI Brandi [name] is a 85 y.o. femalewho presents to the ED for evaluation of syncopal episode.   Chart review indicates hx HTN, HLD, urge incontinence and orthostatic hypotension.  CAD Lives at home with her 2 sons, one of which presents with her and provides some additional hx.  Ambulatory with cane or walker.   Pt reports 2 days of N/V/D. She denies hematochezia, melena or hematemesis. Does report increased urinary frequency from baseline without dysuria or hematuria.  Pt reports being on the toilet this evening attempting to pass a bowel movement, but was unsuccessful. Reports pushing and then feeling dizzy, subsequently syncopizing. Son reports being near by and helping her throughout the day due to her persistent symptoms. They deny significant injury or fall, and the just slumped to the side against the wall. No seizure activity.    Past Medical History:  Diagnosis Date  . Anemia   . Anxiety   . Arthritis, degenerative   . Chronic cystitis   . Depression   . Diabetes mellitus without complication ([place])   . GERD (gastroesophageal reflux disease)   . Headache   . Heart disease   . HLD (hyperlipidemia)   . HTN (hypertension)   . Hypertension   . Incontinence in female   . Murmur   . Obesity   . Sleep apnea   . Urge incontinence of urine     Patient Active Problem List   Diagnosis Date Noted  . AKI (acute kidney injury) ([place]) 04/13/2020  . Acute hyponatremia 04/13/2020  . Dehydration 04/13/2020  . Diarrhea 04/13/2020  . Abdominal pain 04/13/2020  . Syncope 03/28/2020  . CAD (coronary artery disease) 03/28/2020  . Unstable angina ([place])  05/21/2018  . Chest pain 05/20/2018  . Positional lightheadedness 03/14/2017  . Diabetes mellitus type 2, insulin dependent ([place]) 03/14/2017  . Action tremor 09/11/2016  . Headache disorder 09/11/2016  . Severe sepsis ([place]) 04/14/2016  . Orthostatic hypotension 11/10/2015  . Diabetic neuropathy ([place]) 10/06/2015  . Recurrent UTI 09/13/2015  . Cystocele, grade 3 09/13/2015  . Incontinence 09/13/2015  . Depression, major, in remission ([place]) 12/23/2014  . Pernicious anemia 12/23/2014  . Coronary arteriosclerosis 01/06/2014  . Sleep apnea in adult 01/06/2014  . Benign essential hypertension 12/01/2013  . Obesity, unspecified 12/01/2013  . Acquired cyst of kidney 05/02/2013  . Chronic cystitis 03/11/2012    Past Surgical History:  Procedure Laterality Date  . BREAST BIOPSY Right 2004   neg  . BUNIONECTOMY    . CARDIAC SURGERY     Stent  . CHOLECYSTECTOMY    . COLONOSCOPY WITH PROPOFOL N/A 06/14/2017   Procedure: COLONOSCOPY WITH PROPOFOL;  Surgeon: Lollie Sails, MD;  Location: [facility] ENDOSCOPY;  Service: Endoscopy;  Laterality: N/A;  . JOINT REPLACEMENT     TKR Bilateral  . LEFT HEART CATH AND CORONARY ANGIOGRAPHY N/A 05/22/2018   Procedure: LEFT HEART CATH AND CORONARY ANGIOGRAPHY;  Surgeon: Corey Skains, MD;  Location: [place] CV LAB;  Service: Cardiovascular;  Laterality: N/A;  . LOOP RECORDER INSERTION N/A 09/20/2016   Procedure: Loop Recorder Insertion;  Surgeon: Isaias Cowman, MD;  Location: [place] CV [facility];  Service: Cardiovascular;  Laterality: N/A;  .  SEPTOPLASTY      Prior to Admission medications   Medication Sig Start Date End Date Taking? Authorizing Provider  acetaminophen (TYLENOL) 325 MG tablet Take 650 mg by mouth every 6 (six) hours as needed.    [provider]  albuterol (VENTOLIN HFA) 108 (90 Base) MCG/ACT inhaler Inhale 1-2 puffs into the lungs every 6 (six) hours as needed for wheezing or shortness of breath.    [provider]  aspirin 81 MG EC tablet Take 81 mg by mouth daily.     [provider]  atorvastatin (LIPITOR) 40 MG tablet Take 40 mg by mouth every evening. 08/05/19   [provider]  budesonide-formoterol (SYMBICORT) 80-4.5 MCG/ACT inhaler Inhale 2 puffs into the lungs 2 (two) times daily.    [provider]  Calcium Carb-Cholecalciferol (CALCIUM + D3 PO) Take 1 tablet by mouth daily.    [provider]  carboxymethylcellulose (REFRESH TEARS) 0.5 % SOLN Apply 1-2 drops to eye at bedtime.     [provider]  carvedilol (COREG) 12.5 MG tablet Take 12.5 mg by mouth 2 (two) times daily. 02/07/20   [provider]  Cyanocobalamin (VITAMIN B-12 PO) Take 1 tablet by mouth daily.    [provider]  famotidine (PEPCID) 20 MG tablet Take 20 mg by mouth 2 (two) times daily.    [provider]  gabapentin (NEURONTIN) 100 MG capsule Take 200 mg by mouth 2 (two) times daily. 01/21/20   [provider]  glipiZIDE (GLUCOTROL) 5 MG tablet Take 5 mg by mouth 2 (two) times daily.  08/19/13   [provider]  Insulin Glargine (LANTUS SOLOSTAR) 100 UNIT/ML Solostar Pen Inject 12 Units into the skin at bedtime.  07/20/15   [provider]  JANUMET XR 50-1000 MG TB24 Take 1 tablet by mouth 2 (two) times daily. 10/02/18   [provider]  losartan (COZAAR) 100 MG tablet Take 100 mg by mouth daily.    [provider]  Multiple Vitamins-Minerals (PRESERVISION/LUTEIN) CAPS Take 1 capsule by mouth 2 (two) times daily. 03/29/20   Nicole Kindred A, DO  MYRBETRIQ 25 MG TB24 tablet TAKE 1 TABLET(25 MG) BY MOUTH DAILY 04/22/20   McGowan, Larene Beach A, PA-C  nortriptyline (PAMELOR) 25 MG capsule Take 25 mg by mouth at bedtime. 03/13/20   [provider]  pantoprazole (PROTONIX) 20 MG tablet Take 20 mg by mouth 2 (two) times daily. 02/20/20   [provider]  pregabalin (LYRICA) 75 MG capsule Take 1 capsule  (75 mg total) by mouth 2 (two) times daily. 03/29/20   Ezekiel Slocumb, DO  senna-docusate (SENOKOT-S) 8.6-50 MG tablet Take 1 tablet by mouth at bedtime as needed for mild constipation. 03/29/20   Ezekiel Slocumb, DO  sodium chloride (ALTAMIST SPRAY) 0.65 % nasal spray Place into the nose.     [provider]  traZODone (DESYREL) 50 MG tablet Take 1 tablet (50 mg total) by mouth at bedtime as needed for sleep. 03/29/20   Nicole Kindred A, DO  triamcinolone cream (KENALOG) 0.5 % Apply 1 application topically 2 (two) times daily. 04/01/20   [provider]  venlafaxine (EFFEXOR) 37.5 MG tablet Take 37.5 mg by mouth 2 (two) times daily. 01/21/20   [provider]  Verapamil HCl CR 300 MG CP24 Take 300 capsules by mouth daily.     [provider]    Allergies Accupril [quinapril hcl], Micardis [telmisartan], and Quinapril  Family History  Problem Relation  Age of Onset  . Breast cancer Cousin   . Skin cancer Other   . Diabetes Other   . Hypercholesterolemia Other   . Hypertension Other   . Bladder Cancer Maternal Aunt   . COPD Other   . Kidney disease Neg Hx   . Kidney cancer Neg Hx     Social History Social History   Tobacco Use  . Smoking status: Never Smoker  . Smokeless tobacco: Never Used  Substance Use Topics  . Alcohol use: No  . Drug use: No    Review of Systems  Constitutional: No fever/chills Eyes: No visual changes. ENT: No sore throat. Cardiovascular: Denies chest pain. Respiratory: Denies shortness of breath. Gastrointestinal: Positive for abd pain/ N/VD  No constipation. Genitourinary: Negative for dysuria. Musculoskeletal: Negative for back pain. Skin: Negative for rash. Neurological: Negative for headaches, focal weakness or numbness. Positive for syncope   ____________________________________________   PHYSICAL EXAM:  VITAL SIGNS: Vitals:   10/13/20 2330 10/14/20 0000  BP: (!) 171/83 (!) 165/77  Pulse: 86  83  Resp: 18 18  Temp:    SpO2: 97% 94%     Constitutional: Alert and oriented. Well appearing and in no acute distress. Eyes: Conjunctivae are normal. PERRL. EOMI. Head: Atraumatic. Nose: No congestion/rhinnorhea. Mouth/Throat: Mucous membranes are moist.  Oropharynx non-erythematous. Neck: No stridor. No cervical spine tenderness to palpation. Cardiovascular: Normal rate, regular rhythm. Grossly normal heart sounds.  Good peripheral circulation. Respiratory: Normal respiratory effort.  No retractions. Lungs CTAB. Gastrointestinal: Soft , nondistended . No CVA tenderness. Left inguinal firm palpable mass with significant TTP.  Lesser tenderness along suprapubic abdomen and periumbilical Musculoskeletal: No lower extremity tenderness nor edema.  No joint effusions. No signs of acute trauma. Neurologic:  Normal speech and language. No gross focal neurologic deficits are appreciated. No gait instability noted. Skin:  Skin is warm, dry and intact. No rash noted. Psychiatric: Mood and affect are normal. Speech and behavior are normal. ____________________________________________   LABS (all labs ordered are listed, but only abnormal results are displayed)  Labs Reviewed  BASIC METABOLIC PANEL - Abnormal; Notable for the following components:      Result Value   Sodium 133 (*)    Chloride 97 (*)    Glucose, Bld 195 (*)    Creatinine, Ser 1.03 (*)    GFR, Estimated 54 (*)    All other components within normal limits  CBC - Abnormal; Notable for the following components:   WBC 12.7 (*)    RBC 5.62 (*)    Hemoglobin 15.2 (*)    HCT 47.2 (*)    All other components within normal limits  RESP PANEL BY RT-PCR (FLU A&B, COVID) ARPGX2  URINALYSIS, COMPLETE (UACMP) WITH MICROSCOPIC  TYPE AND SCREEN  TROPONIN I (HIGH SENSITIVITY)  TROPONIN I (HIGH SENSITIVITY)   ____________________________________________  12 Lead EKG  Sinus rhythm, rate of 85, normal axis and intervals.  No  evidence of acute ischemia. ____________________________________________  RADIOLOGY  ED MD interpretation:  2 view cxr reviewed by me without evidence of acute cardiopulmonary pathology CT abd reviewed by me with incarcerated left inguinal hernia causing SBo  Official radiology report(s): DG Chest 2 View  Result Date: 10/13/2020 CLINICAL DATA:  Syncopal episode while sitting on the toilet. EXAM: CHEST - 2 VIEW COMPARISON:  04/13/2020 FINDINGS: Postoperative changes in the mediastinum. Loop recorder. Shallow inspiration. Heart size and pulmonary vascularity are normal. Lungs are clear. No pleural effusions. No pneumothorax. Mediastinal contours appear intact. Calcification  of the aorta. IMPRESSION: No active cardiopulmonary disease. Electronically Signed   By: Lucienne Capers M.D.   On: 10/13/2020 22:25   CT ABDOMEN PELVIS W CONTRAST  Result Date: 10/13/2020 CLINICAL DATA:  Acute abdominal pain. Urinary frequency with left costo vertebral ankle tenderness. EXAM: CT ABDOMEN AND PELVIS WITH CONTRAST TECHNIQUE: Multidetector CT imaging of the abdomen and pelvis was performed using the standard protocol following bolus administration of intravenous contrast. CONTRAST:  192mL OMNIPAQUE IOHEXOL 300 MG/ML  SOLN COMPARISON:  Most recent CT 04/13/2020 FINDINGS: Lower chest: Upper normal heart size with coronary artery calcifications and mitral annulus calcifications. Linear atelectasis in both lower lobes. Hepatobiliary: No focal hepatic abnormality. Postcholecystectomy with stable intrahepatic biliary ductal dilatation. Stable common bile duct. No visualized choledocholithiasis. Pancreas: Fatty atrophy.  No ductal dilatation or inflammation. Spleen: Normal in size without focal abnormality. Adrenals/Urinary Tract: Normal adrenal glands. No hydronephrosis. No visualized renal calculi. Homogeneous enhancement with symmetric excretion on delayed phase imaging. Right renal cysts. No suspicious solid lesion.  Partially distended urinary bladder Stomach/Bowel: Moderate hiatal hernia. Stomach is decompressed. Dilated fluid-filled loops of small bowel, greatest dimension 3.7 cm in the lower abdomen. Small bowel transition point appears to be within the left inguinal hernia that contains distal most ileum, with fecalization of small bowel contents. Mild mesenteric edema of central small bowel mesentery with adjacent edema. No bowel pneumatosis or perforation. Lax cecal mesentery. The cecum is low-lying in the pelvic midline. Normal appendix. Moderate stool burden in the right and transverse colon. Small volume of stool in the descending and sigmoid colon. Vascular/Lymphatic: Moderate aortic atherosclerosis. No aortic aneurysm. Patent portal vein. No adenopathy. Reproductive: Atrophic uterus. There is increased bilateral adnexal vascularity. No adnexal mass. Other: Mesenteric edema in the pelvis with trace mesenteric free fluid. No upper abdominal ascites. Left inguinal hernia contains short segment of distal ileum with subsequent obstruction. There is also a small fat containing umbilical hernia. Musculoskeletal: Remote right pelvic fracture. Degenerative change in the spine. There are no acute or suspicious osseous abnormalities. IMPRESSION: 1. Small bowel obstruction with transition point in the left inguinal hernia containing short segment of distal ileum. 2. Laxity of the cecal mesentery, cecum now located in the midline of the pelvis. 3. Small fat containing umbilical hernia. 4. Moderate hiatal hernia. 5. Increased bilateral adnexal vascularity, can be seen with pelvic congestion syndrome in the appropriate clinical setting. Aortic Atherosclerosis (ICD10-I70.0). Electronically Signed   By: Keith Rake M.D.   On: 10/13/2020 23:17    ____________________________________________   PROCEDURES and INTERVENTIONS  Procedure(s) performed (including Critical Care):  Procedures  Medications  lactated ringers  bolus 1,000 mL (1,000 mLs Intravenous New Bag/Given 10/13/20 2225)  ondansetron (ZOFRAN) injection 4 mg (4 mg Intravenous Given 10/13/20 2225)  iohexol (OMNIPAQUE) 300 MG/ML solution 100 mL (100 mLs Intravenous Contrast Given 10/13/20 2259)  morphine 4 MG/ML injection 4 mg (4 mg Intravenous Given 10/13/20 2345)    ____________________________________________   MDM / ED COURSE   Pleasant 85 year old woman with history of cholecystectomy but no other intra-abdominal history presents to the ED with a few days of GI symptoms, causing vasovagal syncopal episode while on the toilet, found to have incarcerated left inguinal hernia causing SBO as a likely etiology of her GI symptoms.  Hemodynamically stable.  Exam initially with lower quadrant tenderness bilaterally, as well as firm palpable mass to left inguinal crease that is exquisitely tender to palpation.  Blood work with minimal leukocytosis and otherwise unremarkable.  Renal function intact.  CT abdomen/pelvis with IV contrast demonstrates incarcerated hernia causing SBO.  I discussed the case with surgery who reviewed images while here on the phone and indicates they plan to take the patient to the OR.  Admitted to their service for further work-up and management  Clinical Course as of 10/14/20 0023  Wed Oct 13, 2020  2341 CT noted, I reviewed these images, that I go to the bedside and discussed this with patient and son.  Palpable mass and very tender to her left inguinal area.  Does not tolerate reduction attempt, so morphine is ordered and we discussed surgical evaluation and hospital admission.  They are agreeable. [DS]  2352 I attempted to reduce the hernia, but unsuccessful.  Surgery paged. [DS]  2354 I speak with Dr. Christian Mate [DS]  Thu Oct 14, 2020  0023 He indicates he will talk with the OR. Surgery now-ish vs first case in the AM [DS]    Clinical Course User Index [DS] Vladimir Crofts, MD     ____________________________________________   FINAL CLINICAL IMPRESSION(S) / ED DIAGNOSES  Final diagnoses:  SBO (small bowel obstruction) ([place])  Incarcerated left inguinal hernia     ED Discharge Orders    None       Jakiyah Stepney Valbuena   Note:  This document was prepared using Dragon voice recognition software and may include unintentional dictation errors.   Vladimir Crofts, MD 10/14/20 Laureen Abrahams

## 2020-10-13 NOTE — ED Notes (Signed)
Pt reports last PO intake at 1900 this evening, reports she had pizza at that time. Denies any food/drink since that time.

## 2020-10-13 NOTE — ED Notes (Signed)
Dr Tamala Julian in with pt and family

## 2020-10-13 NOTE — ED Provider Notes (Incomplete)
[facility] Emergency Department Provider Note ____________________________________________   Event Date/Time   First MD Initiated Contact with Patient 10/13/20 2151     (approximate)  I have reviewed the triage vital signs and the nursing notes.  HISTORY  Chief Complaint Loss of Consciousness   HPI Brandi [name] is a 85 y.o. femalewho presents to the ED for evaluation of syncopal episode.   Chart review indicates hx HTN, HLD, urge incontinence and orthostatic hypotension.  CAD Lives at home with her 2 sons, one of which presents with her and provides some additional hx.  Ambulatory with cane or walker.   Pt reports 2 days of N/V/D. She denies hematochezia, melena or hematemesis. Does report increased urinary frequency from baseline without dysuria or hematuria.  Pt reports being on the toilet this evening attempting to pass a bowel movement, but was unsuccessful. Reports pushing and then feeling dizzy, subsequently syncopizing. Son reports being near by and helping her throughout the day due to her persistent symptoms. They deny significant injury or fall, and the just slumped to the side against the wall. No seizure activity.    Past Medical History:  Diagnosis Date  . Anemia   . Anxiety   . Arthritis, degenerative   . Chronic cystitis   . Depression   . Diabetes mellitus without complication ([place])   . GERD (gastroesophageal reflux disease)   . Headache   . Heart disease   . HLD (hyperlipidemia)   . HTN (hypertension)   . Hypertension   . Incontinence in female   . Murmur   . Obesity   . Sleep apnea   . Urge incontinence of urine     Patient Active Problem List   Diagnosis Date Noted  . AKI (acute kidney injury) ([place]) 04/13/2020  . Acute hyponatremia 04/13/2020  . Dehydration 04/13/2020  . Diarrhea 04/13/2020  . Abdominal pain 04/13/2020  . Syncope 03/28/2020  . CAD (coronary artery disease) 03/28/2020  . Unstable angina ([place])  05/21/2018  . Chest pain 05/20/2018  . Positional lightheadedness 03/14/2017  . Diabetes mellitus type 2, insulin dependent ([place]) 03/14/2017  . Action tremor 09/11/2016  . Headache disorder 09/11/2016  . Severe sepsis ([place]) 04/14/2016  . Orthostatic hypotension 11/10/2015  . Diabetic neuropathy ([place]) 10/06/2015  . Recurrent UTI 09/13/2015  . Cystocele, grade 3 09/13/2015  . Incontinence 09/13/2015  . Depression, major, in remission ([place]) 12/23/2014  . Pernicious anemia 12/23/2014  . Coronary arteriosclerosis 01/06/2014  . Sleep apnea in adult 01/06/2014  . Benign essential hypertension 12/01/2013  . Obesity, unspecified 12/01/2013  . Acquired cyst of kidney 05/02/2013  . Chronic cystitis 03/11/2012    Past Surgical History:  Procedure Laterality Date  . BREAST BIOPSY Right 2004   neg  . BUNIONECTOMY    . CARDIAC SURGERY     Stent  . CHOLECYSTECTOMY    . COLONOSCOPY WITH PROPOFOL N/A 06/14/2017   Procedure: COLONOSCOPY WITH PROPOFOL;  Surgeon: Lollie Sails, MD;  Location: [facility] ENDOSCOPY;  Service: Endoscopy;  Laterality: N/A;  . JOINT REPLACEMENT     TKR Bilateral  . LEFT HEART CATH AND CORONARY ANGIOGRAPHY N/A 05/22/2018   Procedure: LEFT HEART CATH AND CORONARY ANGIOGRAPHY;  Surgeon: Corey Skains, MD;  Location: [place] CV LAB;  Service: Cardiovascular;  Laterality: N/A;  . LOOP RECORDER INSERTION N/A 09/20/2016   Procedure: Loop Recorder Insertion;  Surgeon: Isaias Cowman, MD;  Location: [place] CV LAB;  Service: Cardiovascular;  Laterality: N/A;  .  SEPTOPLASTY      Prior to Admission medications   Medication Sig Start Date End Date Taking? Authorizing Provider  acetaminophen (TYLENOL) 325 MG tablet Take 650 mg by mouth every 6 (six) hours as needed.    [provider]  albuterol (VENTOLIN HFA) 108 (90 Base) MCG/ACT inhaler Inhale 1-2 puffs into the lungs every 6 (six) hours as needed for wheezing or shortness of breath.    [provider]  aspirin 81 MG EC tablet Take 81 mg by mouth daily.     [provider]  atorvastatin (LIPITOR) 40 MG tablet Take 40 mg by mouth every evening. 08/05/19   [provider]  budesonide-formoterol (SYMBICORT) 80-4.5 MCG/ACT inhaler Inhale 2 puffs into the lungs 2 (two) times daily.    [provider]  Calcium Carb-Cholecalciferol (CALCIUM + D3 PO) Take 1 tablet by mouth daily.    [provider]  carboxymethylcellulose (REFRESH TEARS) 0.5 % SOLN Apply 1-2 drops to eye at bedtime.     [provider]  carvedilol (COREG) 12.5 MG tablet Take 12.5 mg by mouth 2 (two) times daily. 02/07/20   [provider]  Cyanocobalamin (VITAMIN B-12 PO) Take 1 tablet by mouth daily.    [provider]  famotidine (PEPCID) 20 MG tablet Take 20 mg by mouth 2 (two) times daily.    [provider]  gabapentin (NEURONTIN) 100 MG capsule Take 200 mg by mouth 2 (two) times daily. 01/21/20   [provider]  glipiZIDE (GLUCOTROL) 5 MG tablet Take 5 mg by mouth 2 (two) times daily.  08/19/13   [provider]  Insulin Glargine (LANTUS SOLOSTAR) 100 UNIT/ML Solostar Pen Inject 12 Units into the skin at bedtime.  07/20/15   [provider]  JANUMET XR 50-1000 MG TB24 Take 1 tablet by mouth 2 (two) times daily. 10/02/18   [provider]  losartan (COZAAR) 100 MG tablet Take 100 mg by mouth daily.    [provider]  Multiple Vitamins-Minerals (PRESERVISION/LUTEIN) CAPS Take 1 capsule by mouth 2 (two) times daily. 03/29/20   Nicole Kindred A, DO  MYRBETRIQ 25 MG TB24 tablet TAKE 1 TABLET(25 MG) BY MOUTH DAILY 04/22/20   McGowan, Larene [place] A, PA-C  nortriptyline (PAMELOR) 25 MG capsule Take 25 mg by mouth at bedtime. 03/13/20   [provider]  pantoprazole (PROTONIX) 20 MG tablet Take 20 mg by mouth 2 (two) times daily. 02/20/20   [provider]  pregabalin (LYRICA) 75 MG capsule Take 1 capsule  (75 mg total) by mouth 2 (two) times daily. 03/29/20   Ezekiel Slocumb, DO  senna-docusate (SENOKOT-S) 8.6-50 MG tablet Take 1 tablet by mouth at bedtime as needed for mild constipation. 03/29/20   Ezekiel Slocumb, DO  sodium chloride (ALTAMIST SPRAY) 0.65 % nasal spray Place into the nose.     [provider]  traZODone (DESYREL) 50 MG tablet Take 1 tablet (50 mg total) by mouth at bedtime as needed for sleep. 03/29/20   Nicole Kindred A, DO  triamcinolone cream (KENALOG) 0.5 % Apply 1 application topically 2 (two) times daily. 04/01/20   [provider]  venlafaxine (EFFEXOR) 37.5 MG tablet Take 37.5 mg by mouth 2 (two) times daily. 01/21/20   [provider]  Verapamil HCl CR 300 MG CP24 Take 300 capsules by mouth daily.     [provider]    Allergies Accupril [quinapril hcl], Micardis [telmisartan], and Quinapril  Family History  Problem Relation  Age of Onset  . Breast cancer Cousin   . Skin cancer Other   . Diabetes Other   . Hypercholesterolemia Other   . Hypertension Other   . Bladder Cancer Maternal Aunt   . COPD Other   . Kidney disease Neg Hx   . Kidney cancer Neg Hx     Social History Social History   Tobacco Use  . Smoking status: Never Smoker  . Smokeless tobacco: Never Used  Substance Use Topics  . Alcohol use: No  . Drug use: No    Review of Systems  Constitutional: No fever/chills Eyes: No visual changes. ENT: No sore throat. Cardiovascular: Denies chest pain. Respiratory: Denies shortness of breath. Gastrointestinal: Positive for abd pain/ N/VD  No constipation. Genitourinary: Negative for dysuria. Musculoskeletal: Negative for back pain. Skin: Negative for rash. Neurological: Negative for headaches, focal weakness or numbness. Positive for syncope   ____________________________________________   PHYSICAL EXAM:  VITAL SIGNS: Vitals:   10/13/20 2250 10/13/20 2330  BP:  (!) 171/83  Pulse: 82 86  Resp:  (!) 26 18  Temp:    SpO2: 96% 97%     Constitutional: Alert and oriented. Well appearing and in no acute distress. Eyes: Conjunctivae are normal. PERRL. EOMI. Head: Atraumatic. Nose: No congestion/rhinnorhea. Mouth/Throat: Mucous membranes are moist.  Oropharynx non-erythematous. Neck: No stridor. No cervical spine tenderness to palpation. Cardiovascular: Normal rate, regular rhythm. Grossly normal heart sounds.  Good peripheral circulation. Respiratory: Normal respiratory effort.  No retractions. Lungs CTAB. Gastrointestinal: Soft , nondistended . No CVA tenderness. Left inguinal firm palpable mass with significant TTP.  Lesser tenderness along suprapubic abdomen and periumbilical Musculoskeletal: No lower extremity tenderness nor edema.  No joint effusions. No signs of acute trauma. Neurologic:  Normal speech and language. No gross focal neurologic deficits are appreciated. No gait instability noted. Skin:  Skin is warm, dry and intact. No rash noted. Psychiatric: Mood and affect are normal. Speech and behavior are normal. ____________________________________________   LABS (all labs ordered are listed, but only abnormal results are displayed)  Labs Reviewed  BASIC METABOLIC PANEL - Abnormal; Notable for the following components:      Result Value   Sodium 133 (*)    Chloride 97 (*)    Glucose, Bld 195 (*)    Creatinine, Ser 1.03 (*)    GFR, Estimated 54 (*)    All other components within normal limits  CBC - Abnormal; Notable for the following components:   WBC 12.7 (*)    RBC 5.62 (*)    Hemoglobin 15.2 (*)    HCT 47.2 (*)    All other components within normal limits  RESP PANEL BY RT-PCR (FLU A&B, COVID) ARPGX2  URINALYSIS, COMPLETE (UACMP) WITH MICROSCOPIC  TROPONIN I (HIGH SENSITIVITY)  TROPONIN I (HIGH SENSITIVITY)   ____________________________________________  12 Lead EKG  Sinus rhythm, rate of   ____________________________________________  RADIOLOGY  ED MD interpretation:  ***  Official radiology report(s): DG Chest 2 View  Result Date: 10/13/2020 CLINICAL DATA:  Syncopal episode while sitting on the toilet. EXAM: CHEST - 2 VIEW COMPARISON:  04/13/2020 FINDINGS: Postoperative changes in the mediastinum. Loop recorder. Shallow inspiration. Heart size and pulmonary vascularity are normal. Lungs are clear. No pleural effusions. No pneumothorax. Mediastinal contours appear intact. Calcification of the aorta. IMPRESSION: No active cardiopulmonary disease. Electronically Signed   By: Lucienne Capers M.D.   On: 10/13/2020 22:25   CT ABDOMEN PELVIS W CONTRAST  Result Date: 10/13/2020 CLINICAL DATA:  Acute  abdominal pain. Urinary frequency with left costo vertebral ankle tenderness. EXAM: CT ABDOMEN AND PELVIS WITH CONTRAST TECHNIQUE: Multidetector CT imaging of the abdomen and pelvis was performed using the standard protocol following bolus administration of intravenous contrast. CONTRAST:  156mL OMNIPAQUE IOHEXOL 300 MG/ML  SOLN COMPARISON:  Most recent CT 04/13/2020 FINDINGS: Lower chest: Upper normal heart size with coronary artery calcifications and mitral annulus calcifications. Linear atelectasis in both lower lobes. Hepatobiliary: No focal hepatic abnormality. Postcholecystectomy with stable intrahepatic biliary ductal dilatation. Stable common bile duct. No visualized choledocholithiasis. Pancreas: Fatty atrophy.  No ductal dilatation or inflammation. Spleen: Normal in size without focal abnormality. Adrenals/Urinary Tract: Normal adrenal glands. No hydronephrosis. No visualized renal calculi. Homogeneous enhancement with symmetric excretion on delayed phase imaging. Right renal cysts. No suspicious solid lesion. Partially distended urinary bladder Stomach/Bowel: Moderate hiatal hernia. Stomach is decompressed. Dilated fluid-filled loops of small bowel, greatest dimension 3.7 cm in the  lower abdomen. Small bowel transition point appears to be within the left inguinal hernia that contains distal most ileum, with fecalization of small bowel contents. Mild mesenteric edema of central small bowel mesentery with adjacent edema. No bowel pneumatosis or perforation. Lax cecal mesentery. The cecum is low-lying in the pelvic midline. Normal appendix. Moderate stool burden in the right and transverse colon. Small volume of stool in the descending and sigmoid colon. Vascular/Lymphatic: Moderate aortic atherosclerosis. No aortic aneurysm. Patent portal vein. No adenopathy. Reproductive: Atrophic uterus. There is increased bilateral adnexal vascularity. No adnexal mass. Other: Mesenteric edema in the pelvis with trace mesenteric free fluid. No upper abdominal ascites. Left inguinal hernia contains short segment of distal ileum with subsequent obstruction. There is also a small fat containing umbilical hernia. Musculoskeletal: Remote right pelvic fracture. Degenerative change in the spine. There are no acute or suspicious osseous abnormalities. IMPRESSION: 1. Small bowel obstruction with transition point in the left inguinal hernia containing short segment of distal ileum. 2. Laxity of the cecal mesentery, cecum now located in the midline of the pelvis. 3. Small fat containing umbilical hernia. 4. Moderate hiatal hernia. 5. Increased bilateral adnexal vascularity, can be seen with pelvic congestion syndrome in the appropriate clinical setting. Aortic Atherosclerosis (ICD10-I70.0). Electronically Signed   By: Keith Rake M.D.   On: 10/13/2020 23:17    ____________________________________________   PROCEDURES and INTERVENTIONS  Procedure(s) performed (including Critical Care):  Procedures  Medications  lactated ringers bolus 1,000 mL (1,000 mLs Intravenous New Bag/Given 10/13/20 2225)  ondansetron (ZOFRAN) injection 4 mg (4 mg Intravenous Given 10/13/20 2225)  iohexol (OMNIPAQUE) 300 MG/ML  solution 100 mL (100 mLs Intravenous Contrast Given 10/13/20 2259)  morphine 4 MG/ML injection 4 mg (4 mg Intravenous Given 10/13/20 2345)    ____________________________________________   MDM / ED COURSE   ***   Clinical Course as of 10/13/20 2355  Wed Oct 13, 2020  2341 CT noted, I reviewed these images, that I go to the bedside and discussed this with patient and son.  Palpable mass and very tender to her left inguinal area.  Does not tolerate reduction attempt, so morphine is ordered and we discussed surgical evaluation and hospital admission.  They are agreeable. [DS]  2352 I attempted to reduce the hernia, but unsuccessful.  Surgery paged. [DS]  2354 I speak with Dr. Christian Mate [DS]    Clinical Course User Index [DS] Vladimir Crofts, MD    ____________________________________________   FINAL CLINICAL IMPRESSION(S) / ED DIAGNOSES  Final diagnoses:  None     ED Discharge Orders  None       Vladimir Crofts   Note:  This document was prepared using Systems analyst and may include unintentional dictation errors.

## 2020-10-13 NOTE — ED Notes (Signed)
Iv fluids infusing, meds given.  nsr on monitor.  Pt alert  Speech clear.

## 2020-10-13 NOTE — ED Triage Notes (Signed)
Pt brought in via ems from home.  Pt had syncopal episode while sitting on the toilet.  Hx cabg.  Pt with n/v on arrival  Pt alert  Speech clear.

## 2020-10-13 NOTE — ED Notes (Signed)
Pt ambulatory with stand by assist to toilet, able to provide UA and assisted back in bed. Pt denied any dizziness while ambulating.

## 2020-10-13 NOTE — ED Notes (Signed)
Dr. Tamala Julian at bedside attempting to reduce hernia.

## 2020-10-14 ENCOUNTER — Inpatient Hospital Stay: Payer: Medicare PPO | Admitting: Anesthesiology

## 2020-10-14 ENCOUNTER — Encounter
Admission: EM | Disposition: A | Discharge status: Home Health | Payer: Self-pay | Source: Home / Self Care | Attending: Surgery

## 2020-10-14 DIAGNOSIS — Z803 Family history of malignant neoplasm of breast: Secondary | ICD-10-CM | POA: Diagnosis not present

## 2020-10-14 DIAGNOSIS — K56609 Unspecified intestinal obstruction, unspecified as to partial versus complete obstruction: Secondary | ICD-10-CM | POA: Diagnosis not present

## 2020-10-14 DIAGNOSIS — K219 Gastro-esophageal reflux disease without esophagitis: Secondary | ICD-10-CM | POA: Diagnosis present

## 2020-10-14 DIAGNOSIS — I10 Essential (primary) hypertension: Secondary | ICD-10-CM | POA: Diagnosis present

## 2020-10-14 DIAGNOSIS — Z808 Family history of malignant neoplasm of other organs or systems: Secondary | ICD-10-CM | POA: Diagnosis not present

## 2020-10-14 DIAGNOSIS — Z833 Family history of diabetes mellitus: Secondary | ICD-10-CM | POA: Diagnosis not present

## 2020-10-14 DIAGNOSIS — Z96653 Presence of artificial knee joint, bilateral: Secondary | ICD-10-CM | POA: Diagnosis present

## 2020-10-14 DIAGNOSIS — M1990 Unspecified osteoarthritis, unspecified site: Secondary | ICD-10-CM | POA: Diagnosis present

## 2020-10-14 DIAGNOSIS — Z8249 Family history of ischemic heart disease and other diseases of the circulatory system: Secondary | ICD-10-CM | POA: Diagnosis not present

## 2020-10-14 DIAGNOSIS — Z888 Allergy status to other drugs, medicaments and biological substances status: Secondary | ICD-10-CM | POA: Diagnosis not present

## 2020-10-14 DIAGNOSIS — K4030 Unilateral inguinal hernia, with obstruction, without gangrene, not specified as recurrent: Secondary | ICD-10-CM | POA: Diagnosis present

## 2020-10-14 DIAGNOSIS — E785 Hyperlipidemia, unspecified: Secondary | ICD-10-CM | POA: Diagnosis present

## 2020-10-14 DIAGNOSIS — Z79899 Other long term (current) drug therapy: Secondary | ICD-10-CM | POA: Diagnosis not present

## 2020-10-14 DIAGNOSIS — Z794 Long term (current) use of insulin: Secondary | ICD-10-CM | POA: Diagnosis not present

## 2020-10-14 DIAGNOSIS — Z20822 Contact with and (suspected) exposure to covid-19: Secondary | ICD-10-CM | POA: Diagnosis present

## 2020-10-14 DIAGNOSIS — R55 Syncope and collapse: Secondary | ICD-10-CM | POA: Diagnosis present

## 2020-10-14 DIAGNOSIS — Z7951 Long term (current) use of inhaled steroids: Secondary | ICD-10-CM | POA: Diagnosis not present

## 2020-10-14 DIAGNOSIS — G4730 Sleep apnea, unspecified: Secondary | ICD-10-CM | POA: Diagnosis present

## 2020-10-14 DIAGNOSIS — I2510 Atherosclerotic heart disease of native coronary artery without angina pectoris: Secondary | ICD-10-CM | POA: Diagnosis present

## 2020-10-14 DIAGNOSIS — Z7984 Long term (current) use of oral hypoglycemic drugs: Secondary | ICD-10-CM | POA: Diagnosis not present

## 2020-10-14 DIAGNOSIS — Z8052 Family history of malignant neoplasm of bladder: Secondary | ICD-10-CM | POA: Diagnosis not present

## 2020-10-14 DIAGNOSIS — Z7982 Long term (current) use of aspirin: Secondary | ICD-10-CM | POA: Diagnosis not present

## 2020-10-14 DIAGNOSIS — E1140 Type 2 diabetes mellitus with diabetic neuropathy, unspecified: Secondary | ICD-10-CM | POA: Diagnosis present

## 2020-10-14 HISTORY — PX: XI ROBOTIC ASSISTED INGUINAL HERNIA REPAIR WITH MESH: SHX6706

## 2020-10-14 LAB — TYPE AND SCREEN
ABO/RH(D): O POS
Antibody Screen: NEGATIVE

## 2020-10-14 LAB — URINALYSIS, COMPLETE (UACMP) WITH MICROSCOPIC
Bacteria, UA: NONE SEEN
Bilirubin Urine: NEGATIVE
Glucose, UA: NEGATIVE mg/dL
Hgb urine dipstick: NEGATIVE
Ketones, ur: NEGATIVE mg/dL
Nitrite: NEGATIVE
Protein, ur: NEGATIVE mg/dL
Specific Gravity, Urine: 1.038 — ABNORMAL HIGH (ref 1.005–1.030)
pH: 6 (ref 5.0–8.0)

## 2020-10-14 LAB — GLUCOSE, CAPILLARY
Glucose-Capillary: 167 mg/dL — ABNORMAL HIGH (ref 70–99)
Glucose-Capillary: 218 mg/dL — ABNORMAL HIGH (ref 70–99)
Glucose-Capillary: 219 mg/dL — ABNORMAL HIGH (ref 70–99)
Glucose-Capillary: 93 mg/dL (ref 70–99)

## 2020-10-14 LAB — RESP PANEL BY RT-PCR (FLU A&B, COVID) ARPGX2
Influenza A by PCR: NEGATIVE
Influenza B by PCR: NEGATIVE
SARS Coronavirus 2 by RT PCR: NEGATIVE

## 2020-10-14 LAB — CBG MONITORING, ED: Glucose-Capillary: 154 mg/dL — ABNORMAL HIGH (ref 70–99)

## 2020-10-14 LAB — HEMOGLOBIN A1C
Hgb A1c MFr Bld: 7.3 % — ABNORMAL HIGH (ref 4.8–5.6)
Mean Plasma Glucose: 162.81 mg/dL

## 2020-10-14 LAB — TROPONIN I (HIGH SENSITIVITY): Troponin I (High Sensitivity): 5 ng/L (ref <18)

## 2020-10-14 SURGERY — REPAIR, HERNIA, INGUINAL, ROBOT-ASSISTED, LAPAROSCOPIC, USING MESH
Anesthesia: General | Site: Abdomen | Laterality: Left

## 2020-10-14 MED ORDER — MIRABEGRON ER 25 MG PO TB24
25.0000 mg | ORAL_TABLET | Freq: Every day | ORAL | Status: DC
  Administered 2020-10-14 – 2020-10-15 (×2): 25 mg via ORAL
  Filled 2020-10-14 (×3): qty 1

## 2020-10-14 MED ORDER — SODIUM CHLORIDE 0.9 % IV SOLN: INTRAVENOUS | Status: DC | PRN

## 2020-10-14 MED ORDER — PROPOFOL 10 MG/ML IV BOLUS
INTRAVENOUS | Status: DC | PRN
  Administered 2020-10-14: 100 mg via INTRAVENOUS

## 2020-10-14 MED ORDER — VERAPAMIL HCL ER 180 MG PO TBCR
180.0000 mg | EXTENDED_RELEASE_TABLET | Freq: Every day | ORAL | Status: DC
  Administered 2020-10-14 – 2020-10-15 (×2): 180 mg via ORAL
  Filled 2020-10-14 (×4): qty 1

## 2020-10-14 MED ORDER — BUPIVACAINE-EPINEPHRINE (PF) 0.25% -1:200000 IJ SOLN
INTRAMUSCULAR | Status: AC
  Filled 2020-10-14: qty 30

## 2020-10-14 MED ORDER — ACETAMINOPHEN 10 MG/ML IV SOLN
INTRAVENOUS | Status: DC | PRN
  Administered 2020-10-14: 1000 mg via INTRAVENOUS

## 2020-10-14 MED ORDER — ONDANSETRON 4 MG PO TBDP: 4.0000 mg | ORAL_TABLET | Freq: Four times a day (QID) | ORAL | Status: DC | PRN

## 2020-10-14 MED ORDER — FENTANYL CITRATE (PF) 100 MCG/2ML IJ SOLN: 25.0000 ug | INTRAMUSCULAR | Status: DC | PRN

## 2020-10-14 MED ORDER — BUPIVACAINE LIPOSOME 1.3 % IJ SUSP
INTRAMUSCULAR | Status: AC
  Filled 2020-10-14: qty 20

## 2020-10-14 MED ORDER — ATORVASTATIN CALCIUM 20 MG PO TABS
40.0000 mg | ORAL_TABLET | Freq: Every evening | ORAL | Status: DC
  Administered 2020-10-14: 40 mg via ORAL
  Filled 2020-10-14: qty 2

## 2020-10-14 MED ORDER — INSULIN ASPART 100 UNIT/ML IJ SOLN: 0.0000 [IU] | Freq: Three times a day (TID) | INTRAMUSCULAR | Status: DC

## 2020-10-14 MED ORDER — ONDANSETRON HCL 4 MG/2ML IJ SOLN: 4.0000 mg | Freq: Four times a day (QID) | INTRAMUSCULAR | Status: DC | PRN

## 2020-10-14 MED ORDER — INSULIN ASPART 100 UNIT/ML IJ SOLN: 0.0000 [IU] | Freq: Every day | INTRAMUSCULAR | Status: DC

## 2020-10-14 MED ORDER — ACETAMINOPHEN 10 MG/ML IV SOLN: 1000.0000 mg | Freq: Once | INTRAVENOUS | Status: DC | PRN

## 2020-10-14 MED ORDER — PROPOFOL 10 MG/ML IV BOLUS
INTRAVENOUS | Status: AC
  Filled 2020-10-14: qty 20

## 2020-10-14 MED ORDER — ONDANSETRON HCL 4 MG/2ML IJ SOLN: 4.0000 mg | Freq: Once | INTRAMUSCULAR | Status: DC | PRN

## 2020-10-14 MED ORDER — OXYCODONE HCL 5 MG/5ML PO SOLN: 5.0000 mg | Freq: Once | ORAL | Status: DC | PRN

## 2020-10-14 MED ORDER — FENTANYL CITRATE (PF) 100 MCG/2ML IJ SOLN
INTRAMUSCULAR | Status: AC
  Filled 2020-10-14: qty 2

## 2020-10-14 MED ORDER — SODIUM CHLORIDE 0.9 % IV SOLN
1.0000 g | INTRAVENOUS | Status: DC
  Administered 2020-10-14 (×2): 1 g via INTRAVENOUS
  Filled 2020-10-14: qty 1

## 2020-10-14 MED ORDER — SUCCINYLCHOLINE CHLORIDE 20 MG/ML IJ SOLN
INTRAMUSCULAR | Status: DC | PRN
  Administered 2020-10-14: 100 mg via INTRAVENOUS

## 2020-10-14 MED ORDER — INSULIN ASPART 100 UNIT/ML IJ SOLN
0.0000 [IU] | Freq: Three times a day (TID) | INTRAMUSCULAR | Status: DC
  Administered 2020-10-14: 5 [IU] via SUBCUTANEOUS
  Administered 2020-10-15 (×2): 3 [IU] via SUBCUTANEOUS
  Filled 2020-10-14 (×3): qty 1

## 2020-10-14 MED ORDER — BUPIVACAINE LIPOSOME 1.3 % IJ SUSP
INTRAMUSCULAR | Status: DC | PRN
  Administered 2020-10-14: 20 mL

## 2020-10-14 MED ORDER — BUPIVACAINE-EPINEPHRINE (PF) 0.25% -1:200000 IJ SOLN
INTRAMUSCULAR | Status: DC | PRN
  Administered 2020-10-14: 10 mL

## 2020-10-14 MED ORDER — ONDANSETRON 4 MG PO TBDP
4.0000 mg | ORAL_TABLET | Freq: Three times a day (TID) | ORAL | Status: DC | PRN
  Filled 2020-10-14: qty 1

## 2020-10-14 MED ORDER — VERAPAMIL HCL ER 300 MG PO CP24: 1.0000 | ORAL_CAPSULE | Freq: Every day | ORAL | Status: DC

## 2020-10-14 MED ORDER — OXYCODONE HCL 5 MG PO TABS: 5.0000 mg | ORAL_TABLET | Freq: Once | ORAL | Status: DC | PRN

## 2020-10-14 MED ORDER — HEPARIN SODIUM (PORCINE) 5000 UNIT/ML IJ SOLN
5000.0000 [IU] | Freq: Three times a day (TID) | INTRAMUSCULAR | Status: DC
  Administered 2020-10-14 – 2020-10-15 (×5): 5000 [IU] via SUBCUTANEOUS
  Filled 2020-10-14 (×5): qty 1

## 2020-10-14 MED ORDER — CARVEDILOL 12.5 MG PO TABS
12.5000 mg | ORAL_TABLET | Freq: Two times a day (BID) | ORAL | Status: DC
  Administered 2020-10-14 – 2020-10-15 (×3): 12.5 mg via ORAL
  Filled 2020-10-14 (×3): qty 1

## 2020-10-14 MED ORDER — LOSARTAN POTASSIUM 50 MG PO TABS: 100.0000 mg | ORAL_TABLET | Freq: Every day | ORAL | Status: DC

## 2020-10-14 MED ORDER — ALBUTEROL SULFATE HFA 108 (90 BASE) MCG/ACT IN AERS: 1.0000 | INHALATION_SPRAY | Freq: Four times a day (QID) | RESPIRATORY_TRACT | Status: DC | PRN

## 2020-10-14 MED ORDER — CARBOXYMETHYLCELLULOSE SODIUM 0.5 % OP SOLN: 1.0000 [drp] | Freq: Every day | OPHTHALMIC | Status: DC

## 2020-10-14 MED ORDER — MOMETASONE FURO-FORMOTEROL FUM 100-5 MCG/ACT IN AERO
2.0000 | INHALATION_SPRAY | Freq: Two times a day (BID) | RESPIRATORY_TRACT | Status: DC
  Administered 2020-10-14 – 2020-10-15 (×3): 2 via RESPIRATORY_TRACT
  Filled 2020-10-14: qty 8.8

## 2020-10-14 MED ORDER — MOMETASONE FURO-FORMOTEROL FUM 100-5 MCG/ACT IN AERO: 2.0000 | INHALATION_SPRAY | Freq: Two times a day (BID) | RESPIRATORY_TRACT | Status: DC

## 2020-10-14 MED ORDER — INSULIN ASPART 100 UNIT/ML ~~LOC~~ SOLN: 0.0000 [IU] | SUBCUTANEOUS | Status: DC

## 2020-10-14 MED ORDER — ACETAMINOPHEN 10 MG/ML IV SOLN
INTRAVENOUS | Status: AC
  Filled 2020-10-14: qty 100

## 2020-10-14 MED ORDER — FAMOTIDINE 20 MG PO TABS
20.0000 mg | ORAL_TABLET | Freq: Every day | ORAL | Status: DC
  Administered 2020-10-14 – 2020-10-15 (×2): 20 mg via ORAL
  Filled 2020-10-14 (×2): qty 1

## 2020-10-14 MED ORDER — LOSARTAN POTASSIUM 50 MG PO TABS
100.0000 mg | ORAL_TABLET | Freq: Every day | ORAL | Status: DC
  Administered 2020-10-14 – 2020-10-15 (×2): 100 mg via ORAL
  Filled 2020-10-14 (×3): qty 2

## 2020-10-14 MED ORDER — MENTHOL 3 MG MT LOZG
1.0000 | LOZENGE | OROMUCOSAL | Status: DC | PRN
  Administered 2020-10-14: 3 mg via ORAL
  Filled 2020-10-14: qty 9

## 2020-10-14 MED ORDER — GABAPENTIN 100 MG PO CAPS
200.0000 mg | ORAL_CAPSULE | Freq: Two times a day (BID) | ORAL | Status: DC
  Administered 2020-10-14 – 2020-10-15 (×3): 200 mg via ORAL
  Filled 2020-10-14 (×3): qty 2

## 2020-10-14 MED ORDER — SODIUM CHLORIDE 0.9 % IV SOLN
INTRAVENOUS | Status: DC | PRN
  Administered 2020-10-14: 50 ug/min via INTRAVENOUS

## 2020-10-14 MED ORDER — MELATONIN 5 MG PO TABS
10.0000 mg | ORAL_TABLET | Freq: Every day | ORAL | Status: DC
  Administered 2020-10-14: 10 mg via ORAL
  Filled 2020-10-14: qty 2

## 2020-10-14 MED ORDER — ASPIRIN EC 81 MG PO TBEC
81.0000 mg | DELAYED_RELEASE_TABLET | Freq: Every day | ORAL | Status: DC
  Administered 2020-10-14 – 2020-10-15 (×2): 81 mg via ORAL
  Filled 2020-10-14 (×2): qty 1

## 2020-10-14 MED ORDER — HYDROCODONE-ACETAMINOPHEN 5-325 MG PO TABS
1.0000 | ORAL_TABLET | ORAL | Status: DC | PRN
  Administered 2020-10-14 – 2020-10-15 (×3): 1 via ORAL
  Filled 2020-10-14 (×3): qty 1

## 2020-10-14 MED ORDER — MORPHINE SULFATE (PF) 4 MG/ML IV SOLN: 2.0000 mg | INTRAVENOUS | Status: DC | PRN

## 2020-10-14 MED ORDER — LACTATED RINGERS IV SOLN: INTRAVENOUS | Status: DC

## 2020-10-14 MED ORDER — METOCLOPRAMIDE HCL 5 MG/ML IJ SOLN
10.0000 mg | Freq: Once | INTRAMUSCULAR | Status: AC
  Administered 2020-10-14: 10 mg via INTRAVENOUS
  Filled 2020-10-14: qty 2

## 2020-10-14 MED ORDER — VENLAFAXINE HCL 37.5 MG PO TABS
37.5000 mg | ORAL_TABLET | Freq: Two times a day (BID) | ORAL | Status: DC
  Administered 2020-10-14 – 2020-10-15 (×3): 37.5 mg via ORAL
  Filled 2020-10-14 (×5): qty 1

## 2020-10-14 MED ORDER — CARVEDILOL 12.5 MG PO TABS: 12.5000 mg | ORAL_TABLET | Freq: Two times a day (BID) | ORAL | Status: DC

## 2020-10-14 MED ORDER — VERAPAMIL HCL ER 120 MG PO TBCR
120.0000 mg | EXTENDED_RELEASE_TABLET | Freq: Every day | ORAL | Status: DC
  Administered 2020-10-14 – 2020-10-15 (×2): 120 mg via ORAL
  Filled 2020-10-14 (×5): qty 1

## 2020-10-14 MED ORDER — FENTANYL CITRATE (PF) 100 MCG/2ML IJ SOLN
INTRAMUSCULAR | Status: DC | PRN
  Administered 2020-10-14: 50 ug via INTRAVENOUS
  Administered 2020-10-14: 25 ug via INTRAVENOUS
  Administered 2020-10-14: 50 ug via INTRAVENOUS
  Administered 2020-10-14: 25 ug via INTRAVENOUS
  Administered 2020-10-14: 50 ug via INTRAVENOUS

## 2020-10-14 MED ORDER — LIDOCAINE HCL (CARDIAC) PF 100 MG/5ML IV SOSY
PREFILLED_SYRINGE | INTRAVENOUS | Status: DC | PRN
  Administered 2020-10-14: 80 mg via INTRAVENOUS

## 2020-10-14 MED ORDER — ATORVASTATIN CALCIUM 40 MG PO TABS: 40.0000 mg | ORAL_TABLET | Freq: Every evening | ORAL | Status: DC

## 2020-10-14 MED ORDER — MORPHINE SULFATE (PF) 4 MG/ML IV SOLN
4.0000 mg | Freq: Once | INTRAVENOUS | Status: AC
  Administered 2020-10-14: 4 mg via INTRAVENOUS
  Filled 2020-10-14: qty 1

## 2020-10-14 MED ORDER — FAMOTIDINE 20 MG PO TABS: 20.0000 mg | ORAL_TABLET | Freq: Two times a day (BID) | ORAL | Status: DC

## 2020-10-14 MED ORDER — ONDANSETRON HCL 4 MG/2ML IJ SOLN
INTRAMUSCULAR | Status: DC | PRN
  Administered 2020-10-14: 4 mg via INTRAVENOUS

## 2020-10-14 MED ORDER — SUGAMMADEX SODIUM 500 MG/5ML IV SOLN
INTRAVENOUS | Status: DC | PRN
  Administered 2020-10-14: 200 mg via INTRAVENOUS

## 2020-10-14 MED ORDER — ONDANSETRON HCL 4 MG/2ML IJ SOLN
4.0000 mg | Freq: Four times a day (QID) | INTRAMUSCULAR | Status: DC | PRN
  Administered 2020-10-14: 4 mg via INTRAVENOUS
  Filled 2020-10-14: qty 2

## 2020-10-14 MED ORDER — ALBUTEROL SULFATE HFA 108 (90 BASE) MCG/ACT IN AERS
1.0000 | INHALATION_SPRAY | Freq: Four times a day (QID) | RESPIRATORY_TRACT | Status: DC | PRN
  Filled 2020-10-14: qty 6.7

## 2020-10-14 MED ORDER — NORTRIPTYLINE HCL 25 MG PO CAPS
25.0000 mg | ORAL_CAPSULE | Freq: Every day | ORAL | Status: DC
  Administered 2020-10-14: 25 mg via ORAL
  Filled 2020-10-14 (×2): qty 1

## 2020-10-14 MED ORDER — ROCURONIUM BROMIDE 100 MG/10ML IV SOLN
INTRAVENOUS | Status: DC | PRN
  Administered 2020-10-14: 30 mg via INTRAVENOUS

## 2020-10-14 MED ORDER — PANTOPRAZOLE SODIUM 20 MG PO TBEC
20.0000 mg | DELAYED_RELEASE_TABLET | Freq: Two times a day (BID) | ORAL | Status: DC
  Administered 2020-10-14 – 2020-10-15 (×3): 20 mg via ORAL
  Filled 2020-10-14 (×4): qty 1

## 2020-10-14 MED ORDER — PANTOPRAZOLE SODIUM 20 MG PO TBEC: 20.0000 mg | DELAYED_RELEASE_TABLET | Freq: Two times a day (BID) | ORAL | Status: DC

## 2020-10-14 SURGICAL SUPPLY — 50 items
ADH SKN CLS APL DERMABOND .7 (GAUZE/BANDAGES/DRESSINGS) ×1
APL PRP STRL LF DISP 70% ISPRP (MISCELLANEOUS) ×1
BLADE CLIPPER SURG (BLADE) ×1 IMPLANT
CANISTER SUCT 1200ML W/VALVE (MISCELLANEOUS) ×1 IMPLANT
CANNULA CAP OBTURATR AIRSEAL 8 (CAP) ×2 IMPLANT
CHLORAPREP W/TINT 26 (MISCELLANEOUS) ×2 IMPLANT
COVER TIP SHEARS 8 DVNC (MISCELLANEOUS) ×1 IMPLANT
COVER TIP SHEARS 8MM DA VINCI (MISCELLANEOUS) ×2
COVER WAND RF STERILE (DRAPES) ×3 IMPLANT
DEFOGGER SCOPE WARMER CLEARIFY (MISCELLANEOUS) ×2 IMPLANT
DERMABOND ADVANCED (GAUZE/BANDAGES/DRESSINGS) ×1
DERMABOND ADVANCED .7 DNX12 (GAUZE/BANDAGES/DRESSINGS) ×1 IMPLANT
DRAPE 3/4 80X56 (DRAPES) ×2 IMPLANT
DRAPE ARM DVNC X/XI (DISPOSABLE) ×3 IMPLANT
DRAPE COLUMN DVNC XI (DISPOSABLE) ×1 IMPLANT
DRAPE DA VINCI XI ARM (DISPOSABLE) ×6
DRAPE DA VINCI XI COLUMN (DISPOSABLE) ×2
ELECT REM PT RETURN 9FT ADLT (ELECTROSURGICAL) ×2
ELECTRODE REM PT RTRN 9FT ADLT (ELECTROSURGICAL) ×1 IMPLANT
GLOVE SURG NEOP MICRO LF SZ7.5 (GLOVE) ×2 IMPLANT
GLOVE SURG NEOPR MICRO LF SZ7 (GLOVE) ×5 IMPLANT
GLOVE SURG ORTHO LTX SZ7.5 (GLOVE) IMPLANT
GOWN STRL REUS W/ TWL LRG LVL3 (GOWN DISPOSABLE) ×3 IMPLANT
GOWN STRL REUS W/TWL LRG LVL3 (GOWN DISPOSABLE) ×10
GRASPER SUT TROCAR 14GX15 (MISCELLANEOUS) ×2 IMPLANT
IRRIGATION STRYKERFLOW (MISCELLANEOUS) IMPLANT
IRRIGATOR STRYKERFLOW (MISCELLANEOUS)
IV CATH ANGIO 14GX1.88 NO SAFE (IV SOLUTION) ×2 IMPLANT
IV NS 1000ML (IV SOLUTION)
IV NS 1000ML BAXH (IV SOLUTION) IMPLANT
KIT PINK PAD W/HEAD ARE REST (MISCELLANEOUS) ×2
KIT PINK PAD W/HEAD ARM REST (MISCELLANEOUS) ×1 IMPLANT
LABEL OR SOLS (LABEL) ×1 IMPLANT
MANIFOLD NEPTUNE II (INSTRUMENTS) ×1 IMPLANT
MESH 3DMAX LIGHT 4.1X6.2 LT LR (Mesh General) ×1 IMPLANT
NDL INSUFFLATION 14GA 120MM (NEEDLE) IMPLANT
NEEDLE HYPO 22GX1.5 SAFETY (NEEDLE) ×2 IMPLANT
NEEDLE INSUFFLATION 14GA 120MM (NEEDLE) IMPLANT
PACK LAP CHOLECYSTECTOMY (MISCELLANEOUS) ×2 IMPLANT
SEAL CANN UNIV 5-8 DVNC XI (MISCELLANEOUS) ×2 IMPLANT
SEAL XI 5MM-8MM UNIVERSAL (MISCELLANEOUS) ×4
SET TUBE FILTERED XL AIRSEAL (SET/KITS/TRAYS/PACK) ×2 IMPLANT
SOLUTION ELECTROLUBE (MISCELLANEOUS) ×2 IMPLANT
SUT MNCRL 4-0 (SUTURE) ×2
SUT MNCRL 4-0 27XMFL (SUTURE) ×1
SUT V-LOC 90 ABS DVC 3-0 CL (SUTURE) IMPLANT
SUT VIC AB 0 CT2 27 (SUTURE) ×2 IMPLANT
SUT VLOC 90 S/L VL9 GS22 (SUTURE) ×2 IMPLANT
SUTURE MNCRL 4-0 27XMF (SUTURE) ×1 IMPLANT
TROCAR Z-THREAD FIOS 11X100 BL (TROCAR) ×1 IMPLANT

## 2020-10-14 NOTE — Transfer of Care (Signed)
Immediate Anesthesia Transfer of Care Note  Patient: Brandi [name]  Procedure(s) Performed: XI ROBOTIC ASSISTED INGUINAL HERNIA REPAIR WITH MESH (Left Abdomen)  Patient Location: PACU  Anesthesia Type:General  Level of Consciousness: sedated  Airway & Oxygen Therapy: Patient Spontanous Breathing and Patient connected to face mask oxygen  Post-op Assessment: Report given to RN and Post -op Vital signs reviewed and stable  Post vital signs: Reviewed and stable  Last Vitals:  Vitals Value Taken Time  BP 143/74 10/14/20 0430  Temp 36.6 C 10/14/20 0421  Pulse 95 10/14/20 0433  Resp 15 10/14/20 0433  SpO2 99 % 10/14/20 0433  Vitals shown include unvalidated device data.  Last Pain:  Vitals:   10/14/20 0421  TempSrc:   PainSc: 0-No pain      Patients Stated Pain Goal: 0 (07/86/75 4492)  Complications: No complications documented.

## 2020-10-14 NOTE — ED Notes (Signed)
Pt emptying bladder at this time prior to transfer to pre-op

## 2020-10-14 NOTE — ED Notes (Signed)
Dr. Luther Bradley called and states he is on his way in to operate on the pt, OR states pt must remain in ED until her covid swab results. OR staff states they spoke with lab and were told it would be at least 30 more minutes.

## 2020-10-14 NOTE — Anesthesia Procedure Notes (Signed)
Procedure Name: Intubation Date/Time: 10/14/2020 2:50 AM Performed by: Lendon Colonel, CRNA Pre-anesthesia Checklist: Patient identified, Patient being monitored, Timeout performed, Emergency Drugs available and Suction available Patient Re-evaluated:Patient Re-evaluated prior to induction Oxygen Delivery Method: Circle system utilized Preoxygenation: Pre-oxygenation with 100% oxygen Induction Type: IV induction and Rapid sequence Laryngoscope Size: McGraph and 3 Grade View: Grade I Tube type: Oral Tube size: 6.5 mm Number of attempts: 1 Airway Equipment and Method: Stylet Placement Confirmation: positive ETCO2,  breath sounds checked- equal and bilateral and ETT inserted through vocal cords under direct vision Secured at: 20 cm Tube secured with: Tape Dental Injury: Teeth and Oropharynx as per pre-operative assessment

## 2020-10-14 NOTE — Anesthesia Postprocedure Evaluation (Signed)
Anesthesia Post Note  Patient: Brandi [name]  Procedure(s) Performed: XI ROBOTIC ASSISTED INGUINAL HERNIA REPAIR WITH MESH (Left Abdomen)  Patient location during evaluation: PACU Anesthesia Type: General Level of consciousness: awake and alert Pain management: pain level controlled Vital Signs Assessment: post-procedure vital signs reviewed and stable Respiratory status: spontaneous breathing, nonlabored ventilation, respiratory function stable and patient connected to nasal cannula oxygen Cardiovascular status: blood pressure returned to baseline and stable Postop Assessment: no apparent nausea or vomiting Anesthetic complications: no   No complications documented.   Last Vitals:  Vitals:   10/14/20 0450 10/14/20 0455  BP: 140/65   Pulse: 89 93  Resp: 19 15  Temp:    SpO2: 97% 97%    Last Pain:  Vitals:   10/14/20 0450  TempSrc:   PainSc: 0-No pain                 Arita Miss

## 2020-10-14 NOTE — Plan of Care (Signed)
Patient will have knowledge of surgery and care instructions

## 2020-10-14 NOTE — H&P (Signed)
Patient ID: Brandi [name], female   DOB: [birth year], 85 y.o.   MRN: [id number]  Chief Complaint: Small bowel obstruction with left inguinal hernia  History of Present Illness Brandi [name] is a 85 y.o. female with a presentation following a syncopal episode.  She reports a 2-day history of nausea/vomiting and diarrhea.  She reports while having a bowel movement and pushing, she became dizzy and fainted.  She has no history of melena, hematochezia, hematemesis.  She denies fevers and chills.  Past Medical History Past Medical History:  Diagnosis Date  . Anemia   . Anxiety   . Arthritis, degenerative   . Chronic cystitis   . Depression   . Diabetes mellitus without complication ([place])   . GERD (gastroesophageal reflux disease)   . Headache   . Heart disease   . HLD (hyperlipidemia)   . HTN (hypertension)   . Hypertension   . Incontinence in female   . Murmur   . Obesity   . Sleep apnea   . Urge incontinence of urine       Past Surgical History:  Procedure Laterality Date  . BREAST BIOPSY Right 2004   neg  . BUNIONECTOMY    . CARDIAC SURGERY     Stent  . CHOLECYSTECTOMY    . COLONOSCOPY WITH PROPOFOL N/A 06/14/2017   Procedure: COLONOSCOPY WITH PROPOFOL;  Surgeon: Lollie Sails, MD;  Location: [facility] ENDOSCOPY;  Service: Endoscopy;  Laterality: N/A;  . JOINT REPLACEMENT     TKR Bilateral  . LEFT HEART CATH AND CORONARY ANGIOGRAPHY N/A 05/22/2018   Procedure: LEFT HEART CATH AND CORONARY ANGIOGRAPHY;  Surgeon: Corey Skains, MD;  Location: [place] CV LAB;  Service: Cardiovascular;  Laterality: N/A;  . LOOP RECORDER INSERTION N/A 09/20/2016   Procedure: Loop Recorder Insertion;  Surgeon: Isaias Cowman, MD;  Location: [place] [facility];  Service: Cardiovascular;  Laterality: N/A;  . SEPTOPLASTY      Allergies  Allergen Reactions  . Accupril [Quinapril Hcl] Rash  . Micardis [Telmisartan] Other (See Comments)    Dizziness  . Quinapril Rash    Current  Facility-Administered Medications  Medication Dose Route Frequency Provider Last Rate Last Admin  . ertapenem (INVANZ) 1,000 mg in sodium chloride 0.9 % 100 mL IVPB  1 g Intravenous Q24H Ronny Bacon, MD       Current Outpatient Medications  Medication Sig Dispense Refill  . acetaminophen (TYLENOL) 325 MG tablet Take 650 mg by mouth every 6 (six) hours as needed.    Marland Kitchen albuterol (VENTOLIN HFA) 108 (90 Base) MCG/ACT inhaler Inhale 1-2 puffs into the lungs every 6 (six) hours as needed for wheezing or shortness of breath.    Marland Kitchen aspirin 81 MG EC tablet Take 81 mg by mouth daily.     Marland Kitchen atorvastatin (LIPITOR) 40 MG tablet Take 40 mg by mouth every evening.    . budesonide-formoterol (SYMBICORT) 80-4.5 MCG/ACT inhaler Inhale 2 puffs into the lungs 2 (two) times daily.    . Calcium Carb-Cholecalciferol (CALCIUM + D3 PO) Take 1 tablet by mouth daily.    . carboxymethylcellulose (REFRESH PLUS) 0.5 % SOLN Apply 1-2 drops to eye at bedtime.     . carvedilol (COREG) 12.5 MG tablet Take 12.5 mg by mouth 2 (two) times daily.    . Cyanocobalamin (VITAMIN B-12 PO) Take 1 tablet by mouth daily.    . famotidine (PEPCID) 20 MG tablet Take 20 mg by mouth 2 (two) times daily.    Marland Kitchen  gabapentin (NEURONTIN) 100 MG capsule Take 200 mg by mouth 2 (two) times daily.    Marland Kitchen glipiZIDE (GLUCOTROL) 5 MG tablet Take 5 mg by mouth daily.    . Insulin Glargine (LANTUS SOLOSTAR) 100 UNIT/ML Solostar Pen Inject 12 Units into the skin daily.    Marland Kitchen JANUMET XR 50-1000 MG TB24 Take 1 tablet by mouth 2 (two) times daily.    Marland Kitchen losartan (COZAAR) 100 MG tablet Take 100 mg by mouth daily.    . Melatonin 10 MG TABS Take 1 tablet by mouth at bedtime.    . Multiple Vitamins-Minerals (PRESERVISION/LUTEIN) CAPS Take 1 capsule by mouth 2 (two) times daily. 60 capsule 1  . MYRBETRIQ 25 MG TB24 tablet TAKE 1 TABLET(25 MG) BY MOUTH DAILY 90 tablet 3  . nortriptyline (PAMELOR) 25 MG capsule Take 25 mg by mouth at bedtime.    . senna-docusate  (SENOKOT-S) 8.6-50 MG tablet Take 1 tablet by mouth at bedtime as needed for mild constipation. 10 tablet 0  . sodium chloride (OCEAN) 0.65 % nasal spray Place 1 spray into the nose 2 (two) times daily.    Marland Kitchen triamcinolone cream (KENALOG) 0.5 % Apply 1 application topically 2 (two) times daily.    Marland Kitchen venlafaxine (EFFEXOR) 37.5 MG tablet Take 37.5 mg by mouth 2 (two) times daily.    . Verapamil HCl CR 300 MG CP24 Take 300 capsules by mouth daily.     . pantoprazole (PROTONIX) 20 MG tablet Take 20 mg by mouth 2 (two) times daily.    . pregabalin (LYRICA) 75 MG capsule Take 1 capsule (75 mg total) by mouth 2 (two) times daily. (Patient not taking: No sig reported) 60 capsule 1  . traZODone (DESYREL) 50 MG tablet Take 1 tablet (50 mg total) by mouth at bedtime as needed for sleep. (Patient not taking: No sig reported) 30 tablet 0    Family History Family History  Problem Relation Age of Onset  . Breast cancer Cousin   . Skin cancer Other   . Diabetes Other   . Hypercholesterolemia Other   . Hypertension Other   . Bladder Cancer Maternal Aunt   . COPD Other   . Kidney disease Neg Hx   . Kidney cancer Neg Hx       Social History Social History   Tobacco Use  . Smoking status: Never Smoker  . Smokeless tobacco: Never Used  Substance Use Topics  . Alcohol use: No  . Drug use: No        ROS Constitutional: No fever/chills Eyes: No visual changes. ENT: No sore throat. Cardiovascular: Denies chest pain. Respiratory: Denies shortness of breath. Gastrointestinal: Positive for abd pain/ N/VD  No constipation. Genitourinary: Negative for dysuria. Musculoskeletal: Negative for back pain. Skin: Negative for rash. Neurological: Negative for headaches, focal weakness or numbness. Positive for syncope   Physical Exam Blood pressure (!) 175/87, pulse 82, temperature 98.3 F (36.8 C), temperature source Oral, resp. rate 18, height 5\' 1"  (1.549 m), weight 86.6 kg, SpO2 95 %. Last Weight   Most recent update: 10/13/2020  9:17 PM   Weight  86.6 kg (191 lb)            CONSTITUTIONAL: Well developed, and nourished, appropriately responsive and aware without distress.   EYES: Sclera non-icteric.   EARS, NOSE, MOUTH AND THROAT: Mask worn.  Hearing is intact to voice.  NECK: Trachea is midline, and there is no jugular venous distension.  LYMPH NODES:  Lymph nodes in the  neck are not enlarged. RESPIRATORY:  Lungs are clear, and breath sounds are equal bilaterally. Normal respiratory effort without pathologic use of accessory muscles. CARDIOVASCULAR: Heart is regular in rate and rhythm. GI: The abdomen is distended, moderately firm, otherwise.  There were no palpable masses.  There were hypoactive bowel sounds.  There is left lower quadrant tenderness GU: Tender left groin mass, irreducible. MUSCULOSKELETAL:  Symmetrical muscle tone appreciated in all four extremities.    SKIN: Skin turgor is normal. No pathologic skin lesions appreciated.  NEUROLOGIC:  Motor and sensation appear grossly normal.  Cranial nerves are grossly without defect. PSYCH:  Alert and oriented to person, place and time. Affect is appropriate for situation.  Data Reviewed I have personally reviewed what is currently available of the patient's imaging, recent labs and medical records.   Labs:  CBC Latest Ref Rng & Units 10/13/2020 04/15/2020 04/14/2020  WBC 4.0 - 10.5 K/uL 12.7(H) 6.2 8.0  Hemoglobin 12.0 - 15.0 g/dL 15.2(H) 11.3(L) 11.8(L)  Hematocrit 36.0 - 46.0 % 47.2(H) 34.7(L) 35.3(L)  Platelets 150 - 400 K/uL 263 223 237   CMP Latest Ref Rng & Units 10/13/2020 04/15/2020 04/14/2020  Glucose 70 - 99 mg/dL 195(H) 153(H) 86  BUN 8 - 23 mg/dL 15 12 16   Creatinine 0.44 - 1.00 mg/dL 1.03(H) 0.69 0.88  Sodium 135 - 145 mmol/L 133(L) 136 136  Potassium 3.5 - 5.1 mmol/L 4.0 3.6 3.7  Chloride 98 - 111 mmol/L 97(L) 99 99  CO2 22 - 32 mmol/L 25 28 27   Calcium 8.9 - 10.3 mg/dL 9.9 8.8(L) 9.0  Total Protein 6.5  - 8.1 g/dL - - 5.8(L)  Total Bilirubin 0.3 - 1.2 mg/dL - - 0.5  Alkaline Phos 38 - 126 U/L - - 44  AST 15 - 41 U/L - - 17  ALT 0 - 44 U/L - - 17      Imaging: Radiology review:  Within last 24 hrs: DG Chest 2 View  Result Date: 10/13/2020 CLINICAL DATA:  Syncopal episode while sitting on the toilet. EXAM: CHEST - 2 VIEW COMPARISON:  04/13/2020 FINDINGS: Postoperative changes in the mediastinum. Loop recorder. Shallow inspiration. Heart size and pulmonary vascularity are normal. Lungs are clear. No pleural effusions. No pneumothorax. Mediastinal contours appear intact. Calcification of the aorta. IMPRESSION: No active cardiopulmonary disease. Electronically Signed   By: Lucienne Capers M.D.   On: 10/13/2020 22:25   CT ABDOMEN PELVIS W CONTRAST  Result Date: 10/13/2020 CLINICAL DATA:  Acute abdominal pain. Urinary frequency with left costo vertebral ankle tenderness. EXAM: CT ABDOMEN AND PELVIS WITH CONTRAST TECHNIQUE: Multidetector CT imaging of the abdomen and pelvis was performed using the standard protocol following bolus administration of intravenous contrast. CONTRAST:  138mL OMNIPAQUE IOHEXOL 300 MG/ML  SOLN COMPARISON:  Most recent CT 04/13/2020 FINDINGS: Lower chest: Upper normal heart size with coronary artery calcifications and mitral annulus calcifications. Linear atelectasis in both lower lobes. Hepatobiliary: No focal hepatic abnormality. Postcholecystectomy with stable intrahepatic biliary ductal dilatation. Stable common bile duct. No visualized choledocholithiasis. Pancreas: Fatty atrophy.  No ductal dilatation or inflammation. Spleen: Normal in size without focal abnormality. Adrenals/Urinary Tract: Normal adrenal glands. No hydronephrosis. No visualized renal calculi. Homogeneous enhancement with symmetric excretion on delayed phase imaging. Right renal cysts. No suspicious solid lesion. Partially distended urinary bladder Stomach/Bowel: Moderate hiatal hernia. Stomach is  decompressed. Dilated fluid-filled loops of small bowel, greatest dimension 3.7 cm in the lower abdomen. Small bowel transition point appears to be within the left inguinal hernia  that contains distal most ileum, with fecalization of small bowel contents. Mild mesenteric edema of central small bowel mesentery with adjacent edema. No bowel pneumatosis or perforation. Lax cecal mesentery. The cecum is low-lying in the pelvic midline. Normal appendix. Moderate stool burden in the right and transverse colon. Small volume of stool in the descending and sigmoid colon. Vascular/Lymphatic: Moderate aortic atherosclerosis. No aortic aneurysm. Patent portal vein. No adenopathy. Reproductive: Atrophic uterus. There is increased bilateral adnexal vascularity. No adnexal mass. Other: Mesenteric edema in the pelvis with trace mesenteric free fluid. No upper abdominal ascites. Left inguinal hernia contains short segment of distal ileum with subsequent obstruction. There is also a small fat containing umbilical hernia. Musculoskeletal: Remote right pelvic fracture. Degenerative change in the spine. There are no acute or suspicious osseous abnormalities. IMPRESSION: 1. Small bowel obstruction with transition point in the left inguinal hernia containing short segment of distal ileum. 2. Laxity of the cecal mesentery, cecum now located in the midline of the pelvis. 3. Small fat containing umbilical hernia. 4. Moderate hiatal hernia. 5. Increased bilateral adnexal vascularity, can be seen with pelvic congestion syndrome in the appropriate clinical setting. Aortic Atherosclerosis (ICD10-I70.0). Electronically Signed   By: Keith Rake M.D.   On: 10/13/2020 23:17    Assessment    Small bowel obstruction secondary to incarcerated left inguinal hernia. Patient Active Problem List   Diagnosis Date Noted  . Incarcerated left inguinal hernia 10/14/2020  . AKI (acute kidney injury) ([place]) 04/13/2020  . Acute hyponatremia  04/13/2020  . Dehydration 04/13/2020  . Diarrhea 04/13/2020  . Abdominal pain 04/13/2020  . Syncope 03/28/2020  . CAD (coronary artery disease) 03/28/2020  . Unstable angina ([place]) 05/21/2018  . Chest pain 05/20/2018  . Positional lightheadedness 03/14/2017  . Diabetes mellitus type 2, insulin dependent ([place]) 03/14/2017  . Action tremor 09/11/2016  . Headache disorder 09/11/2016  . Severe sepsis ([place]) 04/14/2016  . Orthostatic hypotension 11/10/2015  . Diabetic neuropathy ([place]) 10/06/2015  . Recurrent UTI 09/13/2015  . Cystocele, grade 3 09/13/2015  . Incontinence 09/13/2015  . Depression, major, in remission ([place]) 12/23/2014  . Pernicious anemia 12/23/2014  . Coronary arteriosclerosis 01/06/2014  . Sleep apnea in adult 01/06/2014  . Benign essential hypertension 12/01/2013  . Obesity, unspecified 12/01/2013  . Acquired cyst of kidney 05/02/2013  . Chronic cystitis 03/11/2012    Plan    Robotic left inguinal hernia reduction, and repair with mesh. Potential compromise small bowel may require small bowel resection, possibly even open operation. Risks of anesthesia, bleeding, infection, intestinal injury/compromise, hernia recurrence, cardiovascular or cerebrovascular event discussed in detail.  Patient has excepted the risks and has had her questions adequately answered.  She does desires to proceed.  No guarantees were ever expressed or implied.  Face-to-face time spent with the patient and accompanying care providers(if present) was 50 minutes, with more than 50% of the time spent counseling, educating, and coordinating care of the patient.      Ronny Bacon M.D., FACS 10/14/2020, 1:42 AM

## 2020-10-14 NOTE — Op Note (Signed)
Robotic assisted Laparoscopic Transabdominal  incarcerated left inguinal Hernia Repair with Mesh       Pre-operative Diagnosis:   Incarcerated left inguinal Hernia with small bowel obstruction.   Post-operative Diagnosis: Same   Procedure: Robotic assisted Laparoscopic  repair of incarcerated left inguinal hernia(s)   Surgeon: Ronny Bacon, M.D., FACS   Anesthesia: GETA   Findings:  Left inguinal hernia, no evidence of right sided hernia.        Procedure Details  The patient was seen again in the Holding Room. The benefits, complications, treatment options, and expected outcomes were discussed with the patient. The risks of bleeding, infection, recurrence of symptoms, failure to resolve symptoms, recurrence of hernia, chronic pain syndrome or neuroma, were reviewed again. The likelihood of improving the patient's symptoms with return to their baseline status is good.  The patient and/or family concurred with the proposed plan, giving informed consent.  The patient was taken to Operating Room, identified  and the procedure verified as Laparoscopic Inguinal Hernia Repair. Laterality confirmed.  A Time Out was held and the above information confirmed.   Prior to the induction of general anesthesia, antibiotic prophylaxis was administered. VTE prophylaxis was in place. General endotracheal anesthesia was then administered and tolerated well.  After the induction, the hernia was completely reducible, the abdomen was prepped with Chloraprep and draped in the sterile fashion. The patient was positioned in the supine position.   After local infiltration of quarter percent Marcaine with epinephrine, stab incision was made left upper quadrant.  Just below the costal margin approximately midclavicular line the Veress needle is passed with sensation of the layers to penetrate the abdominal wall and into the peritoneum.  Saline drop test is confirmed peritoneal placement.  Insufflation is initiated  with carbon dioxide to pressures of 15 mmHg. An 8.5 mm port is placed to the left off of the midline, with blunt tipped trocar.  Pneumoperitoneum maintained w/o HD changes. No evidence of bowel injuries.  Two 8.5 mm ports placed under direct vision. The laparoscopy revealed large left indirect defect(s).   The robot was brought ot the table and docked in the standard fashion, no collision between arms was observed. Instruments were kept under direct view at all times. For left inguinal hernia repair,  I developed a peritoneal flap. The sac(s) were reduced and dissected free from adjacent structures.   Once dissection was completed a large left sided BARD 3D Light mesh was placed and secured at three points with interrupted 0 Vicryl to the pubic tubercle and anteriorly. There was good coverage of the direct, indirect and femoral spaces.   There was good coverage of the direct, indirect and femoral spaces. The flap was then closed with 2-0 V-lock suture.  Peritoneal closure without defects.   Once assuring that hemostasis was adequate, all needles/sponges removed, and the robot was undocked.  The ports were removed, the abdomen desulflated.  4-0 subcuticular Monocryl was used at all skin edges. Dermabond was placed.  Patient tolerated the procedure well. There were no complications. She was taken to the recovery room in stable condition.           Ronny Bacon, M.D., FACS 10/14/2020, 4:04 AM

## 2020-10-14 NOTE — Anesthesia Preprocedure Evaluation (Addendum)
Anesthesia Evaluation  Patient identified by MRN, date of birth, ID band Patient awake  General Assessment Comment: SBO from incarcerated hernia  Reviewed: Allergy & Precautions, NPO status , Patient's Chart, lab work & pertinent test results  History of Anesthesia Complications Negative for: history of anesthetic complications  Airway Mallampati: III  TM Distance: >3 FB Neck ROM: Full    Dental no notable dental hx. (+) Teeth Intact   Pulmonary sleep apnea and Continuous Positive Airway Pressure Ventilation , neg COPD, Patient abstained from smoking.Not current smoker,  Takes inhaler for unknown reason, morning and night   Pulmonary exam normal breath sounds clear to auscultation       Cardiovascular Exercise Tolerance: Good METShypertension, + CAD and + CABG  (-) Past MI + dysrhythmias Atrial Fibrillation  Rhythm:Regular Rate:Normal - Systolic murmurs S/p cabg 2019 TTE 2020: EF 50%  LVH: MILD LVH  LVH: CONCENTRIC  Mitral: MILD MR  Tricuspid: MILD TR  Morphology: MILDLY THICKENED  Mobility: PARTIALLY MOBILE     Neuro/Psych  Headaches, PSYCHIATRIC DISORDERS Anxiety Depression    GI/Hepatic neg GERD  ,(+)     (-) substance abuse  ,   Endo/Other  diabetes  Renal/GU negative Renal ROS     Musculoskeletal   Abdominal   Peds  Hematology   Anesthesia Other Findings Past Medical History: No date: Anemia No date: Anxiety No date: Arthritis, degenerative No date: Chronic cystitis No date: Depression No date: Diabetes mellitus without complication ([facility]) No date: GERD (gastroesophageal reflux disease) No date: Headache No date: Heart disease No date: HLD (hyperlipidemia) No date: HTN (hypertension) No date: Hypertension No date: Incontinence in female No date: Murmur No date: Obesity No date: Sleep apnea No date: Urge incontinence of urine   Reproductive/Obstetrics                             Anesthesia Physical Anesthesia Plan  ASA: III and emergent  Anesthesia Plan: General   Post-op Pain Management:    Induction: Intravenous and Rapid sequence  PONV Risk Score and Plan: 4 or greater and Ondansetron, Dexamethasone and Treatment may vary due to age or medical condition  Airway Management Planned: Oral ETT and Video Laryngoscope Planned  Additional Equipment: None  Intra-op Plan:   Post-operative Plan: Extubation in OR  Informed Consent: I have reviewed the patients History and Physical, chart, labs and discussed the procedure including the risks, benefits and alternatives for the proposed anesthesia with the patient or authorized representative who has indicated his/her understanding and acceptance.     Dental advisory given  Plan Discussed with: CRNA and Surgeon  Anesthesia Plan Comments: (Discussed risks of anesthesia with patient, including PONV, sore throat, lip/dental damage, aspiration. Rare risks discussed as well, such as cardiorespiratory and neurological sequelae. Patient understands.)        Anesthesia Quick Evaluation

## 2020-10-14 NOTE — Evaluation (Signed)
Physical Therapy Evaluation Patient Details Name: Brandi [name] MRN: [id number] DOB: [birth year] Today's Date: 10/14/2020   History of Present Illness  85 yo female with onset of syncopal episode was admitted to hosp and found to have history of N&V, diarrhea.  Fainted on toilet, and after was noted to have incarcerated L inguinal hernia.  Repair was done on 4/28, referred to PT for mobility.  PMHx:  HA, HTN CAD, atherosclerosis, hiatal hernia, incontinence, DM, sleep apnea, anxiety, GERD, depression, murmur, cystitis, OA  Clinical Impression  Pt was seen with family in attendance and was able to get up to walk with minor help, as well as get up from the toilet despite inguinal discomfort.  Pt is motivated but seems a bit confused, although she has family with her at home.  Follow along for goals of acute PT and will need to use stairs to clear her for return home and to validate her ability to manage with HHPT.  Follow acutely as needed.    Follow Up Recommendations Home health PT;Supervision for mobility/OOB;Supervision/Assistance - 24 hour    Equipment Recommendations  Rolling walker with 5" wheels    Recommendations for Other Services       Precautions / Restrictions Precautions Precautions: Fall Precaution Comments: monitor HR Restrictions Weight Bearing Restrictions: No      Mobility  Bed Mobility Overal bed mobility: Needs Assistance Bed Mobility: Rolling;Sidelying to Sit;Sit to Sidelying Rolling: Min assist Sidelying to sit: Min assist     Sit to sidelying: Min assist General bed mobility comments: min assist with bed rail and cues for body mechancis    Transfers Overall transfer level: Needs assistance Equipment used: Rolling walker (2 wheeled);1 person hand held assist Transfers: Sit to/from Stand Sit to Stand: Min guard;Min assist         General transfer comment: min guard to steady initially and stood from toilet with grab bar  alone  Ambulation/Gait Ambulation/Gait assistance: Min guard Gait Distance (Feet): 20 Feet (10 x 2) Assistive device: Rolling walker (2 wheeled);1 person hand held assist Gait Pattern/deviations: Step-through pattern;Decreased stride length;Wide base of support Gait velocity: reduced Gait velocity interpretation: <1.31 ft/sec, indicative of household ambulator General Gait Details: walking in a guarded fashion but has discomfort on surgery site  Stairs            Wheelchair Mobility    Modified Rankin (Stroke Patients Only)       Balance Overall balance assessment: Needs assistance Sitting-balance support: Feet supported Sitting balance-Leahy Scale: Fair     Standing balance support: Bilateral upper extremity supported;During functional activity Standing balance-Leahy Scale: Fair Standing balance comment: less than fair dynamically                             Pertinent Vitals/Pain Pain Assessment: 0-10 Pain Score: 4  Pain Location: L groin Pain Descriptors / Indicators: Operative site guarding Pain Intervention(s): Limited activity within patient's tolerance;Monitored during session;Repositioned    Home Living Family/patient expects to be discharged to:: Private residence Living Arrangements: Children Available Help at Discharge: Family;Available 24 hours/day Type of Home: House Home Access: Stairs to enter Entrance Stairs-Rails: Right Entrance Stairs-Number of Steps: 3 Home Layout: One level Home Equipment: Walker - 2 wheels;Walker - 4 wheels;Cane - single point;Shower seat Additional Comments: has been home with family to assist her    Prior Function Level of Independence: Needs assistance   Gait / Transfers Assistance Needed: rollator with no  help and no recent falls  ADL's / Homemaking Assistance Needed: family to assist her with house and meals  Comments: pt is having some trouble with details, but son does not offer any information about  this     Hand Dominance   Dominant Hand: Right    Extremity/Trunk Assessment   Upper Extremity Assessment Upper Extremity Assessment: Overall WFL for tasks assessed    Lower Extremity Assessment Lower Extremity Assessment: Overall WFL for tasks assessed    Cervical / Trunk Assessment Cervical / Trunk Assessment: Kyphotic  Communication   Communication: No difficulties  Cognition Arousal/Alertness: Awake/alert Behavior During Therapy: WFL for tasks assessed/performed Overall Cognitive Status: Difficult to assess                                 General Comments: son does not address her PLOF regarding cognition, may be baseline      General Comments General comments (skin integrity, edema, etc.): pt is up to walk with help but will drop the walker at times, not especially guarded with gait    Exercises     Assessment/Plan    PT Assessment Patient needs continued PT services  PT Problem List Decreased activity tolerance;Decreased balance;Decreased mobility;Decreased skin integrity;Pain       PT Treatment Interventions DME instruction;Gait training;Stair training;Functional mobility training;Therapeutic exercise;Therapeutic activities;Balance training;Neuromuscular re-education;Patient/family education    PT Goals (Current goals can be found in the Care Plan section)  Acute Rehab PT Goals Patient Stated Goal: none stated x to get to bathroom PT Goal Formulation: With patient/family Time For Goal Achievement: 10/28/20 Potential to Achieve Goals: Good    Frequency Min 2X/week   Barriers to discharge Inaccessible home environment has stairs to enter house    Co-evaluation               AM-PAC PT "6 Clicks" Mobility  Outcome Measure Help needed turning from your back to your side while in a flat bed without using bedrails?: A Little Help needed moving from lying on your back to sitting on the side of a flat bed without using bedrails?: A  Little Help needed moving to and from a bed to a chair (including a wheelchair)?: A Little Help needed standing up from a chair using your arms (e.g., wheelchair or bedside chair)?: A Little Help needed to walk in hospital room?: A Little Help needed climbing 3-5 steps with a railing? : A Little 6 Click Score: 18    End of Session   Activity Tolerance: Patient tolerated treatment well;Patient limited by fatigue;Patient limited by pain Patient left: in bed;with call bell/phone within reach;with bed alarm set;with nursing/sitter in room;with family/visitor present Nurse Communication: Mobility status PT Visit Diagnosis: Unsteadiness on feet (R26.81);Difficulty in walking, not elsewhere classified (R26.2);Pain Pain - Right/Left: Left Pain - part of body:  (groin)    Time: 7353-2992 PT Time Calculation (min) (ACUTE ONLY): 33 min   Charges:   PT Evaluation $PT Eval Moderate Complexity: 1 Mod PT Treatments $Gait Training: 8-22 mins       Ramond Dial 10/14/2020, 5:38 PM Mee Hives, PT MS Acute Rehab Dept. Number: [place] and [place]

## 2020-10-15 ENCOUNTER — Encounter: Payer: Self-pay | Admitting: Surgery

## 2020-10-15 LAB — CBC
HCT: 37.4 % (ref 36.0–46.0)
Hemoglobin: 12 g/dL (ref 12.0–15.0)
MCH: 27.1 pg (ref 26.0–34.0)
MCHC: 32.1 g/dL (ref 30.0–36.0)
MCV: 84.6 fL (ref 80.0–100.0)
Platelets: 171 10*3/uL (ref 150–400)
RBC: 4.42 MIL/uL (ref 3.87–5.11)
RDW: 15.1 % (ref 11.5–15.5)
WBC: 7.2 10*3/uL (ref 4.0–10.5)
nRBC: 0 % (ref 0.0–0.2)

## 2020-10-15 LAB — GLUCOSE, CAPILLARY
Glucose-Capillary: 167 mg/dL — ABNORMAL HIGH (ref 70–99)
Glucose-Capillary: 198 mg/dL — ABNORMAL HIGH (ref 70–99)

## 2020-10-15 MED ORDER — OXYCODONE HCL 5 MG PO TABS: 5.0000 mg | ORAL_TABLET | Freq: Four times a day (QID) | ORAL | 0 refills | Status: DC | PRN

## 2020-10-15 NOTE — TOC Transition Note (Signed)
Transition of Care [facility]) - CM/SW Discharge Note   Patient Details  Name: Brandi [name] MRN: [id number] Date of Birth: [birth year]  Transition of [facility]) CM/SW Contact:  Pete Pelt, RN Phone Number: 10/15/2020, 9:41 AM   Clinical Narrative:   Patient lives at home with sons, states no concerns with getting to appointments or getting medications.  PT ordered rolling walker, patient respectfully declined, states she has a walker at home, PT notified.  Home Health for PT set up with [facility], Brittney accepted via phone.  Patient amenable to [facility].  No further concerns voiced by patient at this time.    Final next level of care: Home w Home Health Services Barriers to Discharge: Barriers Resolved   Patient Goals and CMS Choice     Choice offered to / list presented to : NA  Discharge Placement PASRR number recieved:  (n/a)                Name of family member notified: Pettie,Rad (Son)   [phone] [facility] Phone), patient will notify Patient and family notified of of transfer: 10/15/20  Discharge Plan and Services                DME Arranged:  (see notes)         HH Arranged: PT [place] Agency: [facility] Date [place] Agency Contacted: 10/15/20 Time [place]: [phone] Representative spoke with at [place]: [place] ([place]) Interventions     Readmission Risk Interventions No flowsheet data found.

## 2020-10-15 NOTE — Progress Notes (Signed)
Pt discharged home with services, discharge paperwork given to patient, discharge instructions and education given to patient and family at bedside, both verbalized understanding. PIV removed, pt off unit via WC accompanied by NT.  Verdis Frederickson A Nicoles Sedlacek 1:44 PM 10/15/20

## 2020-10-15 NOTE — Discharge Instructions (Signed)
In addition to included general post-operative instructions,  Diet: Resume home diet.  Activity: No heavy lifting >20 pounds (children, pets, laundry, garbage) for 6 weeks, but light activity and walking are encouraged. Do not drive or drink alcohol if taking narcotic pain medications or having pain that might distract from driving.  Wound care: 2 days after surgery (04/30), you may shower/get incision wet with soapy water and pat dry (do not rub incisions), but no baths or submerging incision underwater until follow-up.   Medications: Resume all home medications. For mild to moderate pain: acetaminophen (Tylenol) or ibuprofen/naproxen (if no kidney disease). Combining Tylenol with alcohol can substantially increase your risk of causing liver disease. Narcotic pain medications, if prescribed, can be used for severe pain, though may cause nausea, constipation, and drowsiness. Do not combine Tylenol and Percocet (or similar) within a 6 hour period as Percocet (and similar) contain(s) Tylenol. If you do not need the narcotic pain medication, you do not need to fill the prescription.  Call office [phone] / [phone]) at any time if any questions, worsening pain, fevers/chills, bleeding, drainage from incision site, or other concerns.

## 2020-10-15 NOTE — Discharge Summary (Signed)
[facility] SURGICAL DISCHARGE SUMMARY  Patient ID: Brandi [name] MRN: [id number] DOB/AGE: 83-02-1936 85 y.o.  Admit date: 10/13/2020 Discharge date: 10/15/2020  Discharge Diagnoses Patient Active Problem List   Diagnosis Date Noted  . Incarcerated left inguinal hernia 10/14/2020    Consultants None  Procedures 10/14/2020 Robotic assisted laparoscopic incarcerated left inguinal hernia repair  HPI: [name] is a 85 y.o. female with a presentation following a syncopal episode.  She reports a 2-day history of nausea/vomiting and diarrhea.  She reports while having a bowel movement and pushing, she became dizzy and fainted.  She has no history of melena, hematochezia, hematemesis.  She denies fevers and chills. Found to have incarcerated left inguinal hernia.   Hospital Course: Informed consent was obtained and documented, and patient underwent uneventful Robotic assisted laparoscopic incarcerated left inguinal hernia repair (Dr Christian Mate, 10/14/2020).  Post-operatively, patient's pain/symptoms improved/resolved and advancement of patient's diet and ambulation with physical therapy were well-tolerated. Initial leukocytosis resolved on POD1. The remainder of patient's hospital course was essentially unremarkable, and discharge planning was initiated accordingly with patient safely able to be discharged home with appropriate discharge instructions, pain control, and outpatient follow-up after all of her questions were answered to her expressed satisfaction.   Discharge Condition: Good   Physical Examination:  Constitutional: Well appearing female, NAD Pulmonary: Normal effort, no respiratory distress Gastrointestinal: Soft, expected incisional soreness, non-distended, no rebound/guarding Skin: Laparoscopic incisions are CDI with dermabond, no erythema or drainage    Allergies as of 10/15/2020      Reactions   Accupril [quinapril Hcl] Rash   Micardis [telmisartan]  Other (See Comments)   Dizziness   Quinapril Rash      Medication List    TAKE these medications   acetaminophen 325 MG tablet Commonly known as: TYLENOL Take 650 mg by mouth every 6 (six) hours as needed.   albuterol 108 (90 Base) MCG/ACT inhaler Commonly known as: VENTOLIN HFA Inhale 1-2 puffs into the lungs every 6 (six) hours as needed for wheezing or shortness of breath.   aspirin 81 MG EC tablet Take 81 mg by mouth daily.   atorvastatin 40 MG tablet Commonly known as: LIPITOR Take 40 mg by mouth every evening.   budesonide-formoterol 80-4.5 MCG/ACT inhaler Commonly known as: SYMBICORT Inhale 2 puffs into the lungs 2 (two) times daily.   CALCIUM + D3 PO Take 1 tablet by mouth daily.   carboxymethylcellulose 0.5 % Soln Commonly known as: REFRESH PLUS Apply 1-2 drops to eye at bedtime.   carvedilol 12.5 MG tablet Commonly known as: COREG Take 12.5 mg by mouth 2 (two) times daily.   famotidine 20 MG tablet Commonly known as: PEPCID Take 20 mg by mouth 2 (two) times daily.   gabapentin 100 MG capsule Commonly known as: NEURONTIN Take 200 mg by mouth 2 (two) times daily.   glipiZIDE 5 MG tablet Commonly known as: GLUCOTROL Take 5 mg by mouth daily.   Janumet XR 50-1000 MG Tb24 Generic drug: SitaGLIPtin-MetFORMIN HCl Take 1 tablet by mouth 2 (two) times daily.   Lantus SoloStar 100 UNIT/ML Solostar Pen Generic drug: insulin glargine Inject 12 Units into the skin daily.   losartan 100 MG tablet Commonly known as: COZAAR Take 100 mg by mouth daily.   Melatonin 10 MG Tabs Take 1 tablet by mouth at bedtime.   Myrbetriq 25 MG Tb24 tablet Generic drug: mirabegron ER TAKE 1 TABLET(25 MG) BY MOUTH DAILY   nortriptyline 25 MG capsule Commonly known  as: PAMELOR Take 25 mg by mouth at bedtime.   oxyCODONE 5 MG immediate release tablet Commonly known as: Oxy IR/ROXICODONE Take 1 tablet (5 mg total) by mouth every 6 (six) hours as needed for severe pain or  breakthrough pain.   pantoprazole 20 MG tablet Commonly known as: PROTONIX Take 20 mg by mouth 2 (two) times daily.   pregabalin 75 MG capsule Commonly known as: LYRICA Take 1 capsule (75 mg total) by mouth 2 (two) times daily.   PreserVision/Lutein Caps Take 1 capsule by mouth 2 (two) times daily.   senna-docusate 8.6-50 MG tablet Commonly known as: Senokot-S Take 1 tablet by mouth at bedtime as needed for mild constipation.   sodium chloride 0.65 % nasal spray Commonly known as: OCEAN Place 1 spray into the nose 2 (two) times daily.   traZODone 50 MG tablet Commonly known as: DESYREL Take 1 tablet (50 mg total) by mouth at bedtime as needed for sleep.   triamcinolone cream 0.5 % Commonly known as: KENALOG Apply 1 application topically 2 (two) times daily.   venlafaxine 37.5 MG tablet Commonly known as: EFFEXOR Take 37.5 mg by mouth 2 (two) times daily.   Verapamil HCl CR 300 MG Cp24 Take 1 capsule by mouth daily.   VITAMIN B-12 PO Take 1 tablet by mouth daily.            Durable Medical Equipment  (From admission, onward)         Start     Ordered   10/15/20 0711  For home use only DME Walker rolling  Once       Question Answer Comment  Walker: With [place] Wheels   Patient needs a walker to treat with the following condition Physical deconditioning      10/15/20 0711            Follow-up Information    Ronny Bacon, MD. Schedule an appointment as soon as possible for a visit in 2 week(s).   Specialty: General Surgery Why: s/p laparoscopic inguinal hernia repair  Contact information: [address] [place] [address] [phone]                Time spent on discharge management including discussion of hospital course, clinical condition, outpatient instructions, prescriptions, and follow up with the patient and members of the medical team: >30 minutes  -- Edison Simon , PA-C [facility]   10/15/2020, 9:12 AM [phone] M-F: 7am - 4pm

## 2020-10-28 ENCOUNTER — Ambulatory Visit (INDEPENDENT_AMBULATORY_CARE_PROVIDER_SITE_OTHER): Payer: Medicare PPO | Admitting: Surgery

## 2020-10-28 ENCOUNTER — Encounter: Payer: Self-pay | Admitting: Surgery

## 2020-10-28 ENCOUNTER — Other Ambulatory Visit: Payer: Self-pay

## 2020-10-28 VITALS — BP 166/94 | HR 97 | Temp 98.5°F | Ht 61.0 in | Wt 188.2 lb

## 2020-10-28 DIAGNOSIS — Z8719 Personal history of other diseases of the digestive system: Secondary | ICD-10-CM

## 2020-10-28 DIAGNOSIS — Z9889 Other specified postprocedural states: Secondary | ICD-10-CM

## 2020-10-28 NOTE — Progress Notes (Signed)
[facility] POST-OP OFFICE VISIT  10/28/2020  HPI: Brandi [name] is a 85 y.o. female 14 days s/p robotic inguinal hernia repair for SBO.  She reports a small amount of lower abdominal pain extending from left to right, but no pain localized to the right groin.  She reports normal appetite with regular bowel movements.  Vital signs: BP (!) 166/94   Pulse 97   Temp 98.5 F (36.9 C) (Oral)   Ht 5\' 1"  (1.549 m)   Wt 188 lb 3.2 oz (85.4 kg)   SpO2 96%   BMI 35.56 kg/m    Physical Exam: Constitutional: She appears well. Abdomen: Benign, soft nontender.  Diastases recti noted.  No groin bulge or mass. Skin: Incisions are clean, dry and intact.  Assessment/Plan: This is a 85 y.o. female 14 days s/p urgent robotic inguinal hernia repair for presentation with small bowel obstruction.  Doing well.  Patient Active Problem List   Diagnosis Date Noted  . Incarcerated left inguinal hernia 10/14/2020  . AKI (acute kidney injury) ([place]) 04/13/2020  . Acute hyponatremia 04/13/2020  . Dehydration 04/13/2020  . Diarrhea 04/13/2020  . Abdominal pain 04/13/2020  . Syncope 03/28/2020  . CAD (coronary artery disease) 03/28/2020  . Chronic kidney disease, stage III (moderate) ([place]) 02/12/2019  . Background diabetic retinopathy associated with type 2 diabetes mellitus ([place]) 12/06/2018  . Decreased activities of daily living (ADL) 06/05/2018  . Decreased strength, endurance, and mobility 06/05/2018  . S/P CABG x 3 06/04/2018  . Presence of drug coated stent in LAD coronary artery 05/27/2018  . Anxiety 05/25/2018  . Atrial fibrillation and flutter ([place]) 05/25/2018  . H/O gastroesophageal reflux (GERD) 05/25/2018  . Hyperlipidemia 05/25/2018  . Unstable angina ([place]) 05/21/2018  . Chest pain 05/20/2018  . Sacroiliitis ([place]) 02/21/2018  . Positional lightheadedness 03/14/2017  . Diabetes mellitus type 2, insulin dependent ([place]) 03/14/2017  . Action tremor 09/11/2016  . Headache  disorder 09/11/2016  . Severe sepsis ([place]) 04/14/2016  . Orthostatic hypotension 11/10/2015  . Diabetic neuropathy ([place]) 10/06/2015  . Recurrent UTI 09/13/2015  . Cystocele, grade 3 09/13/2015  . Incontinence 09/13/2015  . Depression, major, in remission ([place]) 12/23/2014  . Pernicious anemia 12/23/2014  . Coronary arteriosclerosis 01/06/2014  . Sleep apnea in adult 01/06/2014  . Benign essential hypertension 12/01/2013  . Obesity, unspecified 12/01/2013  . Acquired cyst of kidney 05/02/2013  . Chronic cystitis 03/11/2012  . Urge incontinence 03/11/2012    -Follow-up as needed.  We discussed ready availability to reevaluate should anything change in the near or distant future.   Ronny Bacon M.D., FACS 10/28/2020, 9:30 AM

## 2020-10-28 NOTE — Patient Instructions (Signed)

## 2020-12-08 IMAGING — MG DIGITAL SCREENING BILAT W/ TOMO W/ CAD
8 series · 8 of 24 positions shown · non-contrast
Comparison: Previous exam(s).

CLINICAL DATA: Screening.

EXAM:
DIGITAL SCREENING BILATERAL MAMMOGRAM WITH TOMO AND CAD

[L CC synth-2D]
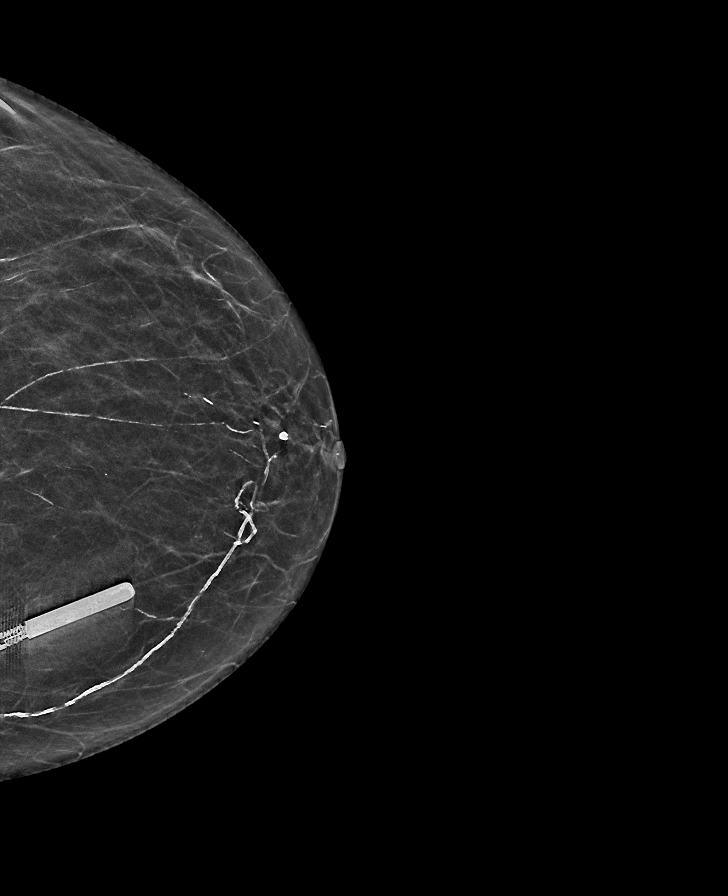

[L MLO synth-2D]
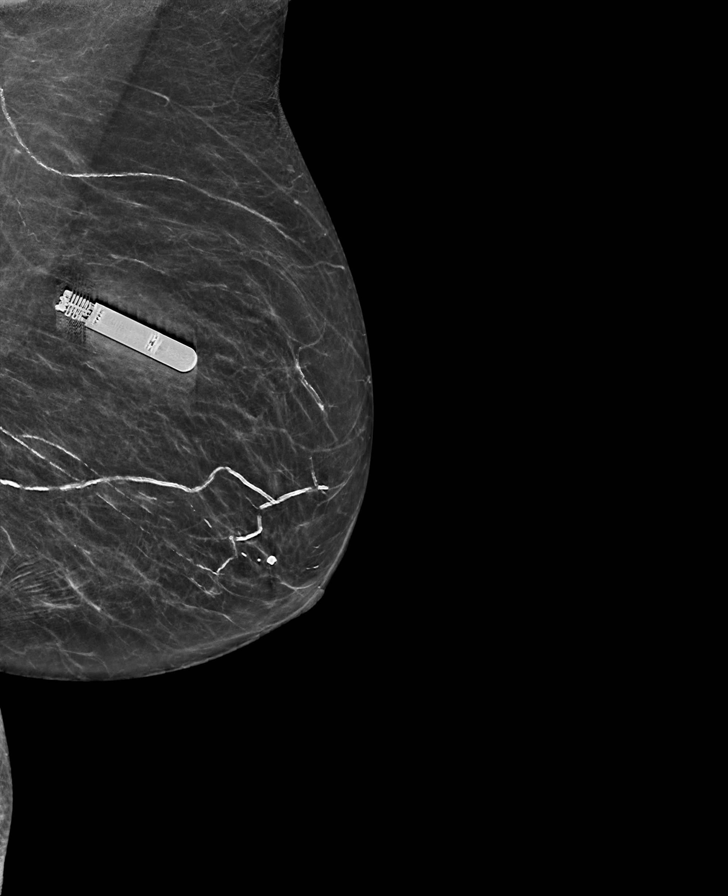

[R MLO synth-2D]
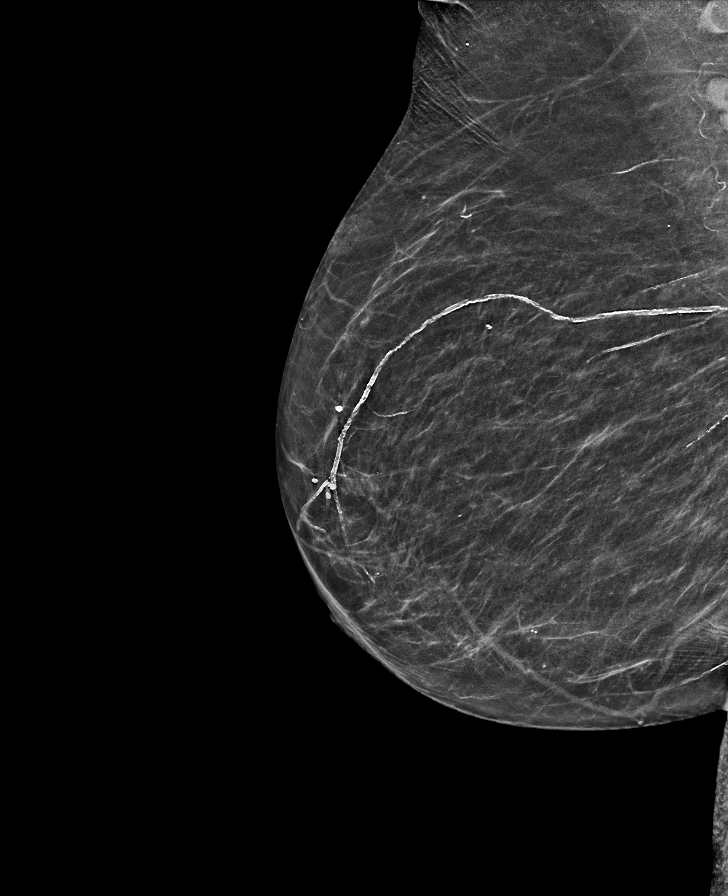

[R CC synth-2D]
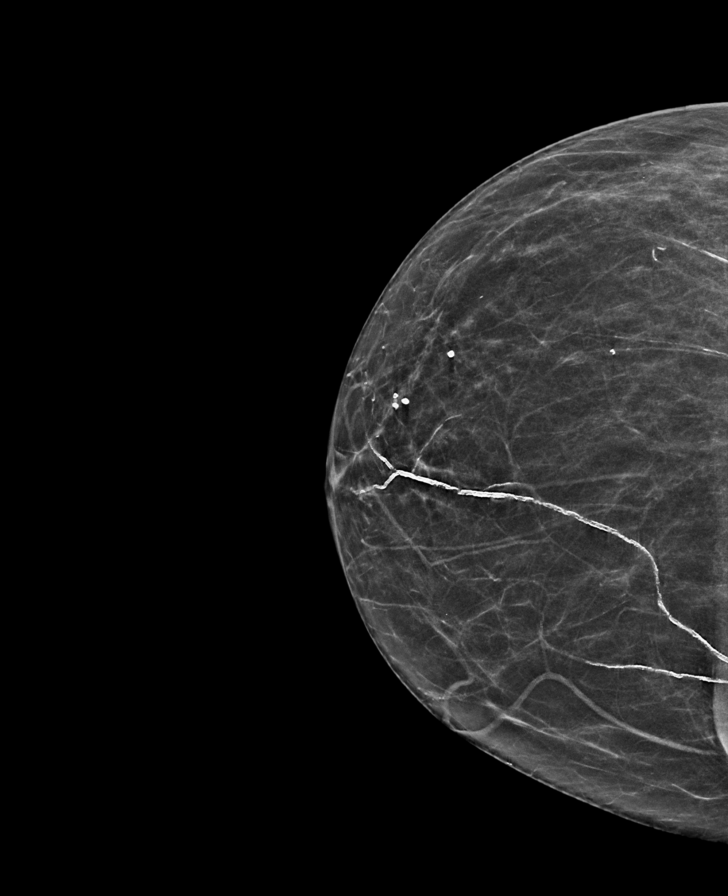

[L CC tomo · tomo slice 25/49.0]
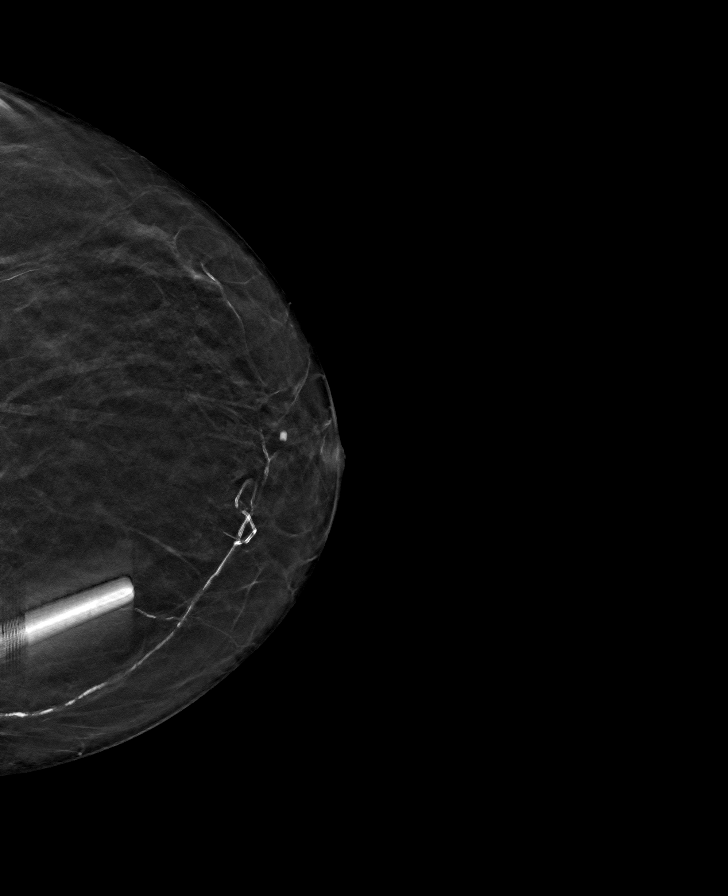

[R MLO tomo · tomo slice 29/56.0]
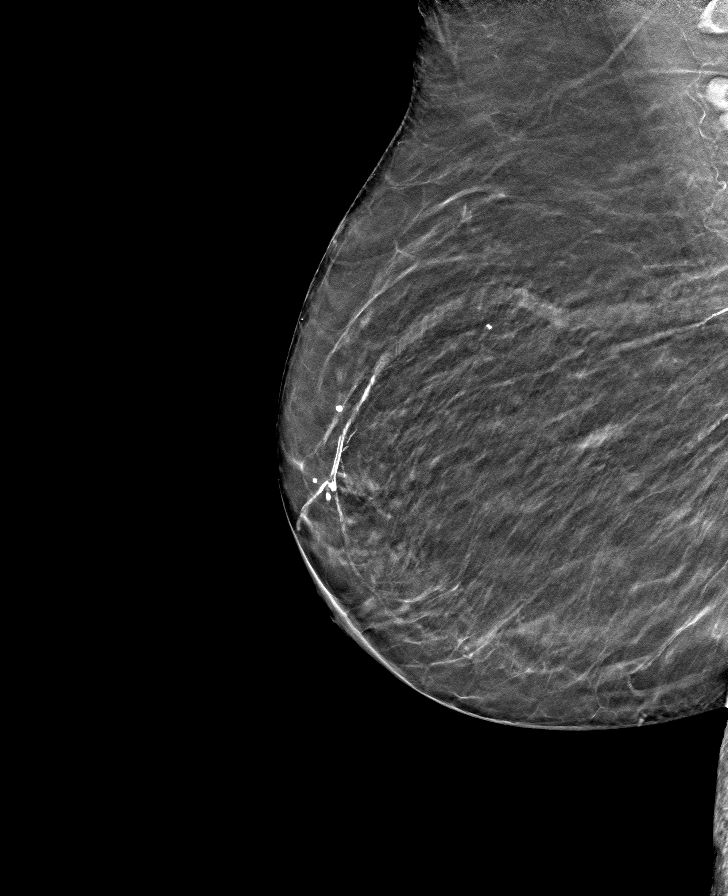

[R CC tomo · tomo slice 25/50.0]
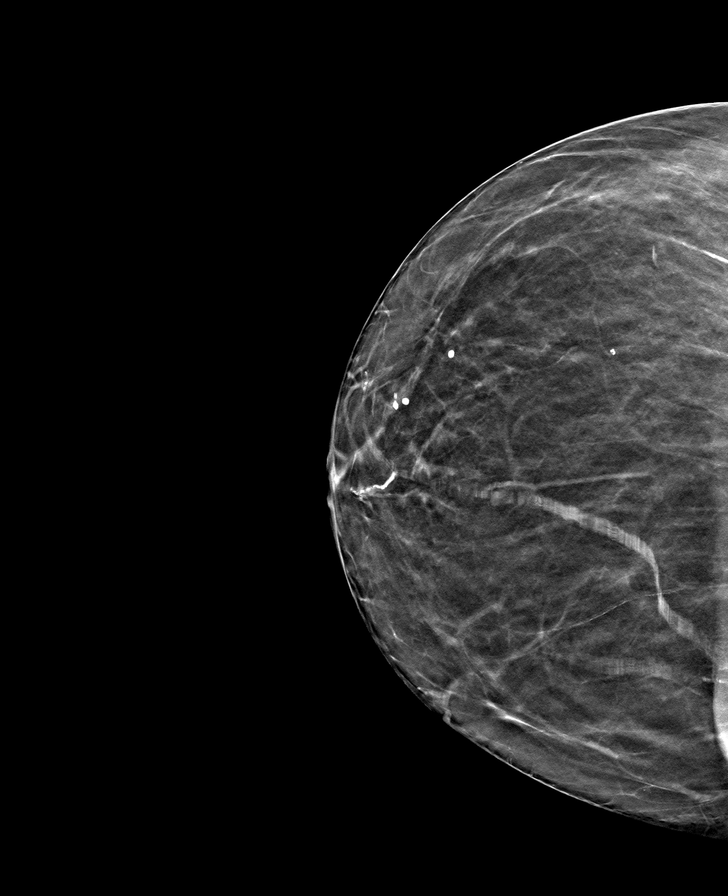

[L MLO tomo · tomo slice 29/57.0]
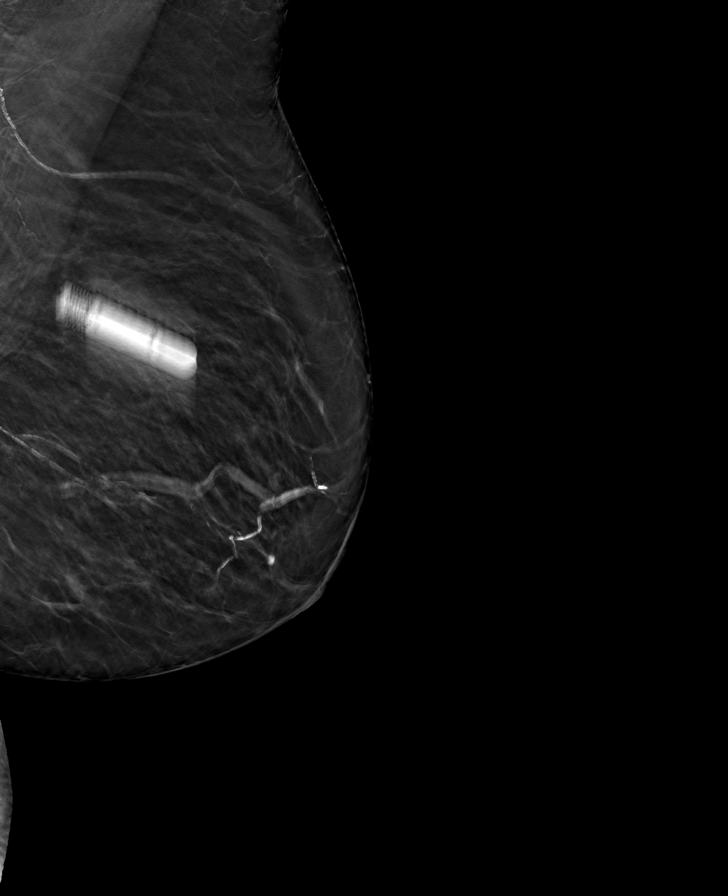

[8 of 24 positions shown; findings below may reference images not displayed]

ACR Breast Density Category b: There are scattered areas of
fibroglandular density.
FINDINGS: There are no findings suspicious for malignancy. Images were
processed with CAD.
IMPRESSION: No mammographic evidence of malignancy. A result letter of this
screening mammogram will be mailed directly to the patient.

RECOMMENDATION:
Screening mammogram in one year. (Code:[id number])

BI-RADS CATEGORY  1: Negative.

## 2021-01-25 ENCOUNTER — Other Ambulatory Visit: Payer: Self-pay | Admitting: Physician Assistant

## 2021-01-25 DIAGNOSIS — R413 Other amnesia: Secondary | ICD-10-CM

## 2021-01-25 DIAGNOSIS — R296 Repeated falls: Secondary | ICD-10-CM

## 2021-01-25 DIAGNOSIS — R202 Paresthesia of skin: Secondary | ICD-10-CM

## 2021-01-25 DIAGNOSIS — R2689 Other abnormalities of gait and mobility: Secondary | ICD-10-CM

## 2021-01-25 DIAGNOSIS — R519 Headache, unspecified: Secondary | ICD-10-CM

## 2021-01-25 DIAGNOSIS — R42 Dizziness and giddiness: Secondary | ICD-10-CM

## 2021-01-25 DIAGNOSIS — R251 Tremor, unspecified: Secondary | ICD-10-CM

## 2021-01-25 DIAGNOSIS — R200 Anesthesia of skin: Secondary | ICD-10-CM

## 2021-02-18 ENCOUNTER — Emergency Department: Payer: Medicare PPO

## 2021-02-18 ENCOUNTER — Emergency Department
Admission: EM | Admit: 2021-02-18 | Discharge: 2021-02-18 | Disposition: A | Discharge status: Home / Self Care | Payer: Medicare PPO | Attending: Emergency Medicine | Admitting: Emergency Medicine

## 2021-02-18 ENCOUNTER — Other Ambulatory Visit: Payer: Self-pay

## 2021-02-18 DIAGNOSIS — Z951 Presence of aortocoronary bypass graft: Secondary | ICD-10-CM | POA: Insufficient documentation

## 2021-02-18 DIAGNOSIS — E11319 Type 2 diabetes mellitus with unspecified diabetic retinopathy without macular edema: Secondary | ICD-10-CM | POA: Insufficient documentation

## 2021-02-18 DIAGNOSIS — E1140 Type 2 diabetes mellitus with diabetic neuropathy, unspecified: Secondary | ICD-10-CM | POA: Insufficient documentation

## 2021-02-18 DIAGNOSIS — W19XXXA Unspecified fall, initial encounter: Secondary | ICD-10-CM

## 2021-02-18 DIAGNOSIS — I2510 Atherosclerotic heart disease of native coronary artery without angina pectoris: Secondary | ICD-10-CM | POA: Insufficient documentation

## 2021-02-18 DIAGNOSIS — Z79899 Other long term (current) drug therapy: Secondary | ICD-10-CM | POA: Diagnosis not present

## 2021-02-18 DIAGNOSIS — Z7982 Long term (current) use of aspirin: Secondary | ICD-10-CM | POA: Insufficient documentation

## 2021-02-18 DIAGNOSIS — S0990XA Unspecified injury of head, initial encounter: Secondary | ICD-10-CM | POA: Diagnosis present

## 2021-02-18 DIAGNOSIS — N183 Chronic kidney disease, stage 3 unspecified: Secondary | ICD-10-CM | POA: Insufficient documentation

## 2021-02-18 DIAGNOSIS — S0093XA Contusion of unspecified part of head, initial encounter: Secondary | ICD-10-CM | POA: Diagnosis not present

## 2021-02-18 DIAGNOSIS — Z794 Long term (current) use of insulin: Secondary | ICD-10-CM | POA: Insufficient documentation

## 2021-02-18 DIAGNOSIS — E1122 Type 2 diabetes mellitus with diabetic chronic kidney disease: Secondary | ICD-10-CM | POA: Insufficient documentation

## 2021-02-18 DIAGNOSIS — S51811A Laceration without foreign body of right forearm, initial encounter: Secondary | ICD-10-CM | POA: Insufficient documentation

## 2021-02-18 DIAGNOSIS — W06XXXA Fall from bed, initial encounter: Secondary | ICD-10-CM | POA: Diagnosis not present

## 2021-02-18 DIAGNOSIS — Z7984 Long term (current) use of oral hypoglycemic drugs: Secondary | ICD-10-CM | POA: Insufficient documentation

## 2021-02-18 DIAGNOSIS — I129 Hypertensive chronic kidney disease with stage 1 through stage 4 chronic kidney disease, or unspecified chronic kidney disease: Secondary | ICD-10-CM | POA: Insufficient documentation

## 2021-02-18 DIAGNOSIS — Z96653 Presence of artificial knee joint, bilateral: Secondary | ICD-10-CM | POA: Diagnosis not present

## 2021-02-18 DIAGNOSIS — M25532 Pain in left wrist: Secondary | ICD-10-CM | POA: Insufficient documentation

## 2021-02-18 DIAGNOSIS — Y92009 Unspecified place in unspecified non-institutional (private) residence as the place of occurrence of the external cause: Secondary | ICD-10-CM

## 2021-02-18 NOTE — ED Provider Notes (Signed)
[facility] Emergency Department Provider Note   ____________________________________________   Event Date/Time   First MD Initiated Contact with Patient 02/18/21 1327     (approximate)  I have reviewed the triage vital signs and the nursing notes.   HISTORY  Chief Complaint Fall    HPI Brandi [name] is a 86 y.o. female patient presents with head contusion and left wrist pain secondary to falling out of bed.  Patient states she was driven by rolling down a hill and rolled out of the bed.  Patient denies LOC, vision disturbance, or vertigo..  Patient also sustained skin tears to the right forearm.  Patient rates pain as a 4/10.  Patient described pain as "sore".  Patient skin tear  bandaged in triage.         Past Medical History:  Diagnosis Date   Anemia    Anxiety    Arthritis, degenerative    Chronic cystitis    Depression    Diabetes mellitus without complication (HCC)    GERD (gastroesophageal reflux disease)    Headache    Heart disease    HLD (hyperlipidemia)    HTN (hypertension)    Hypertension    Incontinence in female    Murmur    Obesity    Sleep apnea    Urge incontinence of urine     Patient Active Problem List   Diagnosis Date Noted   Status post laparoscopic hernia repair 10/28/2020   AKI (acute kidney injury) ([place]) 04/13/2020   Acute hyponatremia 04/13/2020   Dehydration 04/13/2020   Diarrhea 04/13/2020   Abdominal pain 04/13/2020   Syncope 03/28/2020   CAD (coronary artery disease) 03/28/2020   Chronic kidney disease, stage III (moderate) ([place]) 02/12/2019   Background diabetic retinopathy associated with type 2 diabetes mellitus ([place]) 12/06/2018   Decreased activities of daily living (ADL) 06/05/2018   Decreased strength, endurance, and mobility 06/05/2018   S/P CABG x 3 06/04/2018   Presence of drug coated stent in LAD coronary artery 05/27/2018   Anxiety 05/25/2018   Atrial fibrillation and flutter ([place])  05/25/2018   H/O gastroesophageal reflux (GERD) 05/25/2018   Hyperlipidemia 05/25/2018   Unstable angina ([place]) 05/21/2018   Chest pain 05/20/2018   Sacroiliitis ([place]) 02/21/2018   Positional lightheadedness 03/14/2017   Diabetes mellitus type 2, insulin dependent ([place]) 03/14/2017   Action tremor 09/11/2016   Headache disorder 09/11/2016   Severe sepsis ([place]) 04/14/2016   Orthostatic hypotension 11/10/2015   Diabetic neuropathy ([place]) 10/06/2015   Recurrent UTI 09/13/2015   Cystocele, grade 3 09/13/2015   Incontinence 09/13/2015   Depression, major, in remission ([place]) 12/23/2014   Pernicious anemia 12/23/2014   Coronary arteriosclerosis 01/06/2014   Sleep apnea in adult 01/06/2014   Benign essential hypertension 12/01/2013   Obesity, unspecified 12/01/2013   Acquired cyst of kidney 05/02/2013   Chronic cystitis 03/11/2012   Urge incontinence 03/11/2012    Past Surgical History:  Procedure Laterality Date   BREAST BIOPSY Right 2004   neg   BUNIONECTOMY     CARDIAC SURGERY     Stent   CHOLECYSTECTOMY     COLONOSCOPY WITH PROPOFOL N/A 06/14/2017   Procedure: COLONOSCOPY WITH PROPOFOL;  Surgeon: Lollie Sails, MD;  Location: [facility] ENDOSCOPY;  Service: Endoscopy;  Laterality: N/A;   JOINT REPLACEMENT     TKR Bilateral   LEFT HEART CATH AND CORONARY ANGIOGRAPHY N/A 05/22/2018   Procedure: LEFT HEART CATH AND CORONARY ANGIOGRAPHY;  Surgeon: Corey Skains,  MD;  Location: [facility] CV LAB;  Service: Cardiovascular;  Laterality: N/A;   LOOP RECORDER INSERTION N/A 09/20/2016   Procedure: Loop Recorder Insertion;  Surgeon: Isaias Cowman, MD;  Location: [facility];  Service: Cardiovascular;  Laterality: N/A;   SEPTOPLASTY     XI ROBOTIC ASSISTED INGUINAL HERNIA REPAIR WITH MESH Left 10/14/2020   Procedure: XI ROBOTIC ASSISTED INGUINAL HERNIA REPAIR WITH MESH;  Surgeon: Ronny Bacon, MD;  Location: [facility] ORS;  Service: General;  Laterality: Left;    Prior to  Admission medications   Medication Sig Start Date End Date Taking? Authorizing Provider  acetaminophen (TYLENOL) 325 MG tablet Take 650 mg by mouth every 6 (six) hours as needed.    [provider]  albuterol (VENTOLIN HFA) 108 (90 Base) MCG/ACT inhaler Inhale 1-2 puffs into the lungs every 6 (six) hours as needed for wheezing or shortness of breath.    [provider]  aspirin 81 MG EC tablet Take 81 mg by mouth daily.     [provider]  atorvastatin (LIPITOR) 40 MG tablet Take 40 mg by mouth every evening. 08/05/19   [provider]  budesonide-formoterol (SYMBICORT) 80-4.5 MCG/ACT inhaler Inhale 2 puffs into the lungs 2 (two) times daily.    [provider]  Calcium Carb-Cholecalciferol (CALCIUM + D3 PO) Take 1 tablet by mouth daily.    [provider]  carboxymethylcellulose (REFRESH PLUS) 0.5 % SOLN Apply 1-2 drops to eye at bedtime.     [provider]  carvedilol (COREG) 12.5 MG tablet Take 12.5 mg by mouth 2 (two) times daily. 02/07/20   [provider]  Cyanocobalamin (VITAMIN B-12 PO) Take 1 tablet by mouth daily.    [provider]  famotidine (PEPCID) 20 MG tablet Take 20 mg by mouth 2 (two) times daily.    [provider]  gabapentin (NEURONTIN) 100 MG capsule Take 200 mg by mouth 2 (two) times daily. 01/21/20   [provider]  glipiZIDE (GLUCOTROL) 5 MG tablet Take 5 mg by mouth daily. 08/19/13   [provider]  Insulin Glargine (LANTUS SOLOSTAR) 100 UNIT/ML Solostar Pen Inject 12 Units into the skin daily. 07/20/15   [provider]  JANUMET XR 50-1000 MG TB24 Take 1 tablet by mouth 2 (two) times daily. 10/02/18   [provider]  losartan (COZAAR) 100 MG tablet Take 100 mg by mouth daily.    [provider]  Melatonin 10 MG TABS Take 1 tablet by mouth at bedtime.    [provider]  Multiple Vitamins-Minerals (PRESERVISION/LUTEIN) CAPS Take 1  capsule by mouth 2 (two) times daily. 03/29/20   Nicole Kindred A, DO  MYRBETRIQ 25 MG TB24 tablet TAKE 1 TABLET(25 MG) BY MOUTH DAILY 04/22/20   McGowan, Larene [place] A, PA-C  nortriptyline (PAMELOR) 25 MG capsule Take 25 mg by mouth at bedtime. 03/13/20   [provider]  pantoprazole (PROTONIX) 20 MG tablet Take 20 mg by mouth 2 (two) times daily. 02/20/20   [provider]  pregabalin (LYRICA) 75 MG capsule Take 1 capsule (75 mg total) by mouth 2 (two) times daily. 03/29/20   Ezekiel Slocumb, DO  senna-docusate (SENOKOT-S) 8.6-50 MG tablet Take 1 tablet by mouth at bedtime as needed for mild constipation. 03/29/20   Ezekiel Slocumb, DO  sodium chloride (OCEAN) 0.65 % nasal spray Place 1 spray into the nose 2 (two) times daily.    [provider]  traZODone (DESYREL) 50 MG tablet  Take 1 tablet (50 mg total) by mouth at bedtime as needed for sleep. 03/29/20   Nicole Kindred A, DO  triamcinolone cream (KENALOG) 0.5 % Apply 1 application topically 2 (two) times daily. 04/01/20   [provider]  venlafaxine (EFFEXOR) 37.5 MG tablet Take 37.5 mg by mouth 2 (two) times daily. 01/21/20   [provider]  Verapamil HCl CR 300 MG CP24 Take 1 capsule by mouth daily.    [provider]    Allergies Accupril [quinapril hcl], Micardis [telmisartan], and Quinapril  Family History  Problem Relation Age of Onset   Breast cancer Cousin    Skin cancer Other    Diabetes Other    Hypercholesterolemia Other    Hypertension Other    Bladder Cancer Maternal Aunt    COPD Other    Kidney disease Neg Hx    Kidney cancer Neg Hx     Social History Social History   Tobacco Use   Smoking status: Never   Smokeless tobacco: Never  Substance Use Topics   Alcohol use: No   Drug use: No    Review of Systems Constitutional: No fever/chills Eyes: No visual changes. ENT: No sore throat. Cardiovascular: Denies chest pain. Respiratory: Denies shortness of  breath. Gastrointestinal: No abdominal pain.  No nausea, no vomiting.  No diarrhea.  No constipation. Genitourinary: Negative for dysuria. Musculoskeletal: Negative for back pain. Skin: Negative for rash.  Skin tear right forearm Neurological: Negative for headaches, focal weakness or numbness. Psychiatric: Anxiety and depression Endocrine: Diabetes, hyperlipidemia, and hypertension, Hematological/Lymphatic: Anemia Allergic/Immunilogical: Accupril, Micardis, and quinapril ________________   PHYSICAL EXAM:  VITAL SIGNS: ED Triage Vitals  Enc Vitals Group     BP 02/18/21 1223 134/65     Pulse Rate 02/18/21 1223 93     Resp 02/18/21 1223 17     Temp 02/18/21 1223 98.3 F (36.8 C)     Temp Source 02/18/21 1223 Oral     SpO2 02/18/21 1223 98 %     Weight 02/18/21 1223 191 lb (86.6 kg)     Height 02/18/21 1223 '5\' 1"'$  (1.549 m)     Head Circumference --      Peak Flow --      Pain Score 02/18/21 1232 4     Pain Loc --      Pain Edu? --      Excl. in [place]? --    Constitutional: Alert and oriented. Well appearing and in no acute distress. Eyes: Conjunctivae are normal. PERRL. EOMI. Head: Atraumatic. Nose: No congestion/rhinnorhea. Mouth/Throat: Mucous membranes are moist.  Oropharynx non-erythematous. Neck: No cervical spine tenderness to palpation. Hematological/Lymphatic/Immunilogical: No cervical lymphadenopathy. Cardiovascular: Normal rate, regular rhythm. Grossly normal heart sounds.  Good peripheral circulation. Respiratory: Normal respiratory effort.  No retractions. Lungs CTAB. Gastrointestinal: Soft and nontender. No distention. No abdominal bruits. No CVA tenderness. Genitourinary: Deferred Musculoskeletal: No lower extremity tenderness nor edema.  No joint effusions. Neurologic:  Normal speech and language. No gross focal neurologic deficits are appreciated. No gait instability. Skin: 3 skin tears right forearm.  Ecchymosis right forearm.   Psychiatric: Mood and affect  are normal. Speech and behavior are normal.  ____________________________________________   LABS (all labs ordered are listed, but only abnormal results are displayed)  Labs Reviewed - No data to display ____________________________________________  EKG   ____________________________________________  RADIOLOGY I, Sable Feil, personally viewed and evaluated these images (plain radiographs) as part of my medical decision making, as well as reviewing the  written report by the radiologist.  ED MD interpretation: No acute findings x-ray of the l right wrist. Official radiology report(s): DG Wrist Complete Right  Result Date: 02/18/2021 CLINICAL DATA:  Fall with right wrist injury. EXAM: RIGHT WRIST - COMPLETE 3+ VIEW COMPARISON:  None. FINDINGS: No acute fracture or dislocation is identified. The bones are diffusely osteopenic. Degenerative changes are noted, moderate at the STT articulation and mild at the first [facility] and radiocarpal joints. Atherosclerotic vascular calcifications are noted. IMPRESSION: No acute osseous abnormality identified. Electronically Signed   By: Logan Bores M.D.   On: 02/18/2021 13:18   CT HEAD WO CONTRAST (5MM)  Result Date: 02/18/2021 CLINICAL DATA:  Head trauma, minor (Age >= 65y).  Fall. EXAM: CT HEAD WITHOUT CONTRAST TECHNIQUE: Contiguous axial images were obtained from the base of the skull through the vertex without intravenous contrast. COMPARISON:  Head CT and MRI 03/28/2020 FINDINGS: Brain: There is no evidence of an acute infarct, intracranial hemorrhage, mass, midline shift, or extra-axial fluid collection. The ventricles and sulci are normal. Patchy hypodensities in the cerebral white matter bilaterally are similar to the prior CT and are nonspecific but compatible with moderate chronic small vessel ischemic disease. An enlarged partially empty sella is again noted. Vascular: Calcified atherosclerosis at the skull base. No hyperdense vessel. Skull: No  fracture or suspicious osseous lesion. Sinuses/Orbits: Mild mucosal thickening in the sphenoid sinuses. Clear mastoid air cells. Bilateral cataract extraction. Other: None. IMPRESSION: 1. No evidence of acute intracranial abnormality. 2. Moderate chronic small vessel ischemic disease. Electronically Signed   By: Logan Bores M.D.   On: 02/18/2021 13:20    ____________________________________________   PROCEDURES  Procedure(s) performed (including Critical Care):  Marland KitchenMarland KitchenLaceration Repair  Date/Time: 02/18/2021 2:00 PM Performed by: Sable Feil, PA-C Authorized by: Sable Feil, PA-C   Consent:    Consent obtained:  Verbal   Consent given by:  Patient   Risks, benefits, and alternatives were discussed: yes     Risks discussed:  Infection, pain, poor cosmetic result, need for additional repair and poor wound healing Universal protocol:    Procedure explained and questions answered to patient or proxy's satisfaction: yes     Relevant documents present and verified: yes     Test results available: yes     Imaging studies available: yes     Required blood products, implants, devices, and special equipment available: yes     Immediately prior to procedure, a time out was called: yes     Patient identity confirmed:  Hospital-assigned identification number Anesthesia:    Anesthesia method:  None Laceration details:    Location:  Shoulder/arm   Shoulder/arm location:  R lower arm   Length (cm):  6   Depth (mm):  1 Pre-procedure details:    Preparation:  Patient was prepped and draped in usual sterile fashion Exploration:    Contaminated: no   Treatment:    Area cleansed with:  Povidone-iodine and saline   Amount of cleaning:  Standard   Debridement:  None   Undermining:  None   Scar revision: no   Skin repair:    Repair method:  Tissue adhesive Approximation:    Approximation:  Close Repair type:    Repair type:  Simple Post-procedure details:    Dressing:  Non-adherent  dressing   Procedure completion:  Tolerated well, no immediate complications   ____________________________________________   INITIAL IMPRESSION / ASSESSMENT AND PLAN / ED COURSE  As part of my medical decision  making, I reviewed the following data within the [place]         Patient presents with right wrist pain secondary to fall out of bed this morning.  Patient denies LOC or head injury.  Discussed no acute findings on x-ray of the right wrist.  Discussed no acute findings on CT of the head.  See procedure note for wound closure.  Patient given discharge care instructions and return back to ED if condition worsens.      ____________________________________________   FINAL CLINICAL IMPRESSION(S) / ED DIAGNOSES  Final diagnoses:  Fall in home, initial encounter  Contusion of head, initial encounter  Skin tear of right forearm without complication, initial encounter     ED Discharge Orders     None        Note:  This document was prepared using Dragon voice recognition software and may include unintentional dictation errors.    Ellene, Marko, PA-C 02/18/21 1402    Blake Divine, MD 02/18/21 [phone]

## 2021-02-18 NOTE — ED Triage Notes (Signed)
Pt here today with a fall this morning. Pt states that she rolled off the bed and hurt her right wrist and her head. Pt denies LOC after hitting her head.

## 2021-02-18 NOTE — Discharge Instructions (Addendum)
No acute findings x-ray of the right wrist and CT of the head.  Read and follow discharge care instruction with [facility] wound care.

## 2021-02-18 NOTE — ED Notes (Signed)
See triage note  Present s/p fall  States she rolled out of bed  Hitting head   Injury to right wrist area  Skin tear noted to wrist

## 2021-02-23 ENCOUNTER — Ambulatory Visit: Payer: Medicare PPO

## 2021-05-19 ENCOUNTER — Other Ambulatory Visit: Payer: Self-pay | Admitting: Urology

## 2021-07-24 ENCOUNTER — Emergency Department
Admission: EM | Admit: 2021-07-24 | Discharge: 2021-07-24 | Disposition: A | Discharge status: Home / Self Care | Payer: Medicare PPO | Attending: Emergency Medicine | Admitting: Emergency Medicine

## 2021-07-24 ENCOUNTER — Encounter: Payer: Self-pay | Admitting: Emergency Medicine

## 2021-07-24 ENCOUNTER — Other Ambulatory Visit: Payer: Self-pay

## 2021-07-24 ENCOUNTER — Emergency Department: Payer: Medicare PPO

## 2021-07-24 DIAGNOSIS — N189 Chronic kidney disease, unspecified: Secondary | ICD-10-CM | POA: Insufficient documentation

## 2021-07-24 DIAGNOSIS — W01198A Fall on same level from slipping, tripping and stumbling with subsequent striking against other object, initial encounter: Secondary | ICD-10-CM | POA: Diagnosis not present

## 2021-07-24 DIAGNOSIS — E1122 Type 2 diabetes mellitus with diabetic chronic kidney disease: Secondary | ICD-10-CM | POA: Insufficient documentation

## 2021-07-24 DIAGNOSIS — M25531 Pain in right wrist: Secondary | ICD-10-CM | POA: Insufficient documentation

## 2021-07-24 DIAGNOSIS — S52231A Displaced oblique fracture of shaft of right ulna, initial encounter for closed fracture: Secondary | ICD-10-CM | POA: Diagnosis not present

## 2021-07-24 DIAGNOSIS — S52532A Colles' fracture of left radius, initial encounter for closed fracture: Secondary | ICD-10-CM | POA: Insufficient documentation

## 2021-07-24 DIAGNOSIS — R42 Dizziness and giddiness: Secondary | ICD-10-CM

## 2021-07-24 DIAGNOSIS — S0990XA Unspecified injury of head, initial encounter: Secondary | ICD-10-CM | POA: Diagnosis present

## 2021-07-24 DIAGNOSIS — N3000 Acute cystitis without hematuria: Secondary | ICD-10-CM | POA: Diagnosis not present

## 2021-07-24 LAB — CBC WITH DIFFERENTIAL/PLATELET
Abs Immature Granulocytes: 0.02 10*3/uL (ref 0.00–0.07)
Basophils Absolute: 0 10*3/uL (ref 0.0–0.1)
Basophils Relative: 1 %
Eosinophils Absolute: 0.2 10*3/uL (ref 0.0–0.5)
Eosinophils Relative: 3 %
HCT: 42.4 % (ref 36.0–46.0)
Hemoglobin: 13.6 g/dL (ref 12.0–15.0)
Immature Granulocytes: 0 %
Lymphocytes Relative: 25 %
Lymphs Abs: 1.7 10*3/uL (ref 0.7–4.0)
MCH: 27.6 pg (ref 26.0–34.0)
MCHC: 32.1 g/dL (ref 30.0–36.0)
MCV: 86 fL (ref 80.0–100.0)
Monocytes Absolute: 0.5 10*3/uL (ref 0.1–1.0)
Monocytes Relative: 8 %
Neutro Abs: 4.1 10*3/uL (ref 1.7–7.7)
Neutrophils Relative %: 63 %
Platelets: 233 10*3/uL (ref 150–400)
RBC: 4.93 MIL/uL (ref 3.87–5.11)
RDW: 14.2 % (ref 11.5–15.5)
WBC: 6.6 10*3/uL (ref 4.0–10.5)
nRBC: 0 % (ref 0.0–0.2)

## 2021-07-24 LAB — MAGNESIUM: Magnesium: 1.7 mg/dL (ref 1.7–2.4)

## 2021-07-24 LAB — URINALYSIS, ROUTINE W REFLEX MICROSCOPIC
Bilirubin Urine: NEGATIVE
Glucose, UA: NEGATIVE mg/dL
Ketones, ur: NEGATIVE mg/dL
Nitrite: POSITIVE — AB
Protein, ur: NEGATIVE mg/dL
Specific Gravity, Urine: 1.01 (ref 1.005–1.030)
Squamous Epithelial / HPF: NONE SEEN (ref 0–5)
pH: 7 (ref 5.0–8.0)

## 2021-07-24 LAB — BASIC METABOLIC PANEL
Anion gap: 8 (ref 5–15)
BUN: 13 mg/dL (ref 8–23)
CO2: 27 mmol/L (ref 22–32)
Calcium: 9 mg/dL (ref 8.9–10.3)
Chloride: 102 mmol/L (ref 98–111)
Creatinine, Ser: 0.83 mg/dL (ref 0.44–1.00)
GFR, Estimated: >60 mL/min (ref >60)
Glucose, Bld: 100 mg/dL — ABNORMAL HIGH (ref 70–99)
Potassium: 4 mmol/L (ref 3.5–5.1)
Sodium: 137 mmol/L (ref 135–145)

## 2021-07-24 LAB — TROPONIN I (HIGH SENSITIVITY): Troponin I (High Sensitivity): 6 ng/L (ref <18)

## 2021-07-24 MED ORDER — CEPHALEXIN 500 MG PO CAPS: 500.0000 mg | ORAL_CAPSULE | Freq: Two times a day (BID) | ORAL | 0 refills | Status: AC

## 2021-07-24 MED ORDER — NAPROXEN 500 MG PO TABS: 500.0000 mg | ORAL_TABLET | Freq: Two times a day (BID) | ORAL | 0 refills | Status: DC

## 2021-07-24 NOTE — ED Triage Notes (Signed)
Presents vis EMS from home s/p fall  states she went ot get up to go to the bathroom  became dizzy and fell  states she hit the dresser  having pain to left chest,head and right arm  hx of vertigo

## 2021-07-24 NOTE — ED Provider Notes (Signed)
[facility] Provider Note    Event Date/Time   First MD Initiated Contact with Patient 07/24/21 [phone]     (approximate)   History   Fall   HPI  Brandi [name] is a 86 y.o. female with history of A. fib, CKD, diabetes, and as listed in EMR presents to the emergency department for treatment and evaluation after becoming dizzy and falling against her dresser. She had gotten up to go to the bathroom and became dizzy. History of vertigo, but has not caused her to fall in the past. She hit her head, left side of her face, left chest wall, left arm, and right wrist. No loss of consciousness. She took meclizine yesterday, but hadn't had a chance to take her medications today. Marland Kitchen     Physical Exam   Triage Vital Signs: Today's Vitals   07/24/21 0759 07/24/21 0800 07/24/21 0913 07/24/21 1149  BP: (!) 185/95  (!) 215/91   Pulse: 87  90   Resp: 18  16   Temp: 97.7 F (36.5 C)     SpO2: 98%  97%   Weight:  86.6 kg    Height:  5\' 1"  (1.549 m)    PainSc:  5  5  2     Body mass index is 36.07 kg/m.    Most recent vital signs: Vitals:   07/24/21 0759 07/24/21 0913  BP: (!) 185/95 (!) 215/91  Pulse: 87 90  Resp: 18 16  Temp: 97.7 F (36.5 C)   SpO2: 98% 97%    General: Awake, no distress.  CV:  Good peripheral perfusion.  Resp:  Normal effort. Breath sounds clear to auscultation. Abd:  No distention. No pain with palpation. Other:  Erythema noted to left side of face without open wound.  Contusion noted to volar aspect of left wrist without deformity--able to perform ROM of left shoulder, elbow, wrist, and fingers without expressed pain. Contusion noted to volar aspect of right wrist with deformity--diffuse pain of the wrist with decreased ROM. No pain over right shoulder or elbow. Non-focal tenderness over left chest wall.    ED Results / Procedures / Treatments   Labs (all labs ordered are listed, but only abnormal results are displayed) Labs  Reviewed  BASIC METABOLIC PANEL - Abnormal; Notable for the following components:      Result Value   Glucose, Bld 100 (*)    All other components within normal limits  URINALYSIS, ROUTINE W REFLEX MICROSCOPIC - Abnormal; Notable for the following components:   Hgb urine dipstick TRACE (*)    Nitrite POSITIVE (*)    Leukocytes,Ua MODERATE (*)    Bacteria, UA RARE (*)    All other components within normal limits  CBC WITH DIFFERENTIAL/PLATELET  MAGNESIUM  TROPONIN I (HIGH SENSITIVITY)     EKG  ED ECG REPORT I, Shaili Donalson, FNP-BC personally viewed and interpreted this ECG.   Date: 07/24/2021  EKG Time: 9:02  Rate: 85  Rhythm: normal EKG, normal sinus rhythm, unchanged from previous tracings  Axis: normal  Intervals:none  ST&T Change: no ST changes    RADIOLOGY  Image and radiology report reviewed by me.  Image of the left forearm negative on my read. Radiology reading impaction fracture of the distal radius.   Image of the right wrist shows ulnar shaft fracture.  PROCEDURES:  Critical Care performed: No  .Ortho Injury Treatment  Date/Time: 07/24/2021 3:05 PM Performed by: Victorino Dike, FNP Authorized by: Victorino Dike,  FNP   Consent:    Consent obtained:  Verbal   Consent given by:  Patient   Risks discussed:  Restricted joint movementInjury location: wrist Location details: right wrist Injury type: fracture Pre-procedure neurovascular assessment: neurovascularly intact Pre-procedure distal perfusion: normal Pre-procedure neurological function: normal Pre-procedure range of motion: reduced Manipulation performed: no Immobilization: splint Splint type: ulnar gutter Splint Applied by: ED Tech Supplies used: cotton padding, elastic bandage and Ortho-Glass Post-procedure neurovascular assessment: post-procedure neurovascularly intact Post-procedure distal perfusion: normal Post-procedure neurological function: normal Post-procedure range of  motion: unchanged   .Ortho Injury Treatment  Date/Time: 07/24/2021 3:07 PM Performed by: Victorino Dike, FNP Authorized by: Victorino Dike, FNP   Consent:    Consent obtained:  Verbal   Consent given by:  Patient   Risks discussed:  StiffnessInjury location: wrist Location details: left wrist Injury type: fracture Fracture type: distal radius Pre-procedure neurovascular assessment: neurovascularly intact Pre-procedure distal perfusion: normal Pre-procedure neurological function: normal Pre-procedure range of motion: normal Manipulation performed: no Immobilization: splint Splint type: thumb spica Splint Applied by: ED Tech Supplies used: cotton padding, elastic bandage and Ortho-Glass Post-procedure distal perfusion: normal Post-procedure neurological function: normal Post-procedure range of motion comment: immobilized     MEDICATIONS ORDERED IN ED: Medications - No data to display   IMPRESSION / MDM / [place] / ED COURSE  I reviewed the triage vital signs and the nursing notes.  Differential diagnosis includes, but is not limited to, head injury, CVA, vertigo, rib fracture, radius/ulna fracture, wrist fracture.  86 year old female presenting to the emergency department after getting out of bed to go to the restroom and falling against her dresser. See HPI.   On exam she has pain in both arms, more on the right. No pain in hips with knee flexion, internal and external rotation. No focal tenderness over the length of the spine.   CT head, maxillofacial bones, and cervical spine all negative. Will call radiology regarding their written reports as it is confusing to which arm they are referring to.  ----------------------------------------- 11:30 PM on 07/24/2021 ----------------------------------------- Urinalysis concerning for UTI. Will treat with Keflex. Patient placed in OCL bilateral upper extremities. She was offered admission for pain control,  orthopedic consult/procedure, then placement for rehab since both arms are immobilized. After several conversations with family and caregivers who live with her, she would like to try and go home. She was advised to come back to the ER if unable to function. Prior to discharge, she was able to ambulate with some assistance from nurse and son. She still feels like she can go home. Pain is well controlled. She is to take tylenol and naprosyn. Patient and family advised that narcotics would make her dizziness worse.  ----------------------------------------- 2:51 PM on 07/24/2021 ----------------------------------------- While completing this note, addendum of radiology report reviewed. Called x-ray techs for clarification after again reviewing imaging.   ----------------------------------------- 3:19 PM on 07/24/2021 ----------------------------------------- Second addendum of radiology report reviewed and is consistent with her exam and my initial read of imaging. Patient's son, Rad contacted. Situation explained and he will try and remove the LEFT OCL. If he is uncomfortable or can't get it off, he is to return with her to the ER for it to be removed. She will not need another ER visit.  Clinical Course as of 07/24/21 [place] Jul 24, 2021  1055 CT Maxillofacial Wo Contrast [RK]    Clinical Course User Index [RK] Lavonia Drafts, MD     FINAL  CLINICAL IMPRESSION(S) / ED DIAGNOSES   Final diagnoses:  Minor head injury, initial encounter  Closed displaced oblique fracture of shaft of right ulna, initial encounter  Closed Colles' fracture of left radius, initial encounter  Acute cystitis without hematuria  Dizziness     Rx / DC Orders   ED Discharge Orders          Ordered    cephALEXin (KEFLEX) 500 MG capsule  2 times daily        07/24/21 1131    naproxen (NAPROSYN) 500 MG tablet  2 times daily with meals        07/24/21 1132             Note:  This document was  prepared using Dragon voice recognition software and may include unintentional dictation errors.   Victorino Dike, FNP 07/24/21 1535    Delman Kitten, MD 07/25/21 [phone]

## 2021-07-24 NOTE — Discharge Instructions (Addendum)
Please call and schedule a follow up with Dr. Roland Rack.  Also, follow up with primary care for uti.  Return to the ER for symptoms of concern or if you are unable to manage at home.

## 2021-10-19 ENCOUNTER — Other Ambulatory Visit: Payer: Self-pay | Admitting: Internal Medicine

## 2021-10-19 DIAGNOSIS — Z1231 Encounter for screening mammogram for malignant neoplasm of breast: Secondary | ICD-10-CM

## 2021-11-18 ENCOUNTER — Ambulatory Visit
Admission: RE | Admit: 2021-11-18 | Discharge: 2021-11-18 | Disposition: A | Discharge status: Home / Self Care | Payer: Medicare PPO | Source: Ambulatory Visit | Attending: Internal Medicine | Admitting: Internal Medicine

## 2021-11-18 DIAGNOSIS — Z1231 Encounter for screening mammogram for malignant neoplasm of breast: Secondary | ICD-10-CM | POA: Diagnosis not present

## 2022-01-17 DIAGNOSIS — I4729 Other ventricular tachycardia: Secondary | ICD-10-CM

## 2022-01-17 DIAGNOSIS — I471 Supraventricular tachycardia, unspecified: Secondary | ICD-10-CM

## 2022-01-17 HISTORY — DX: Other ventricular tachycardia: I47.29

## 2022-01-17 HISTORY — DX: Supraventricular tachycardia, unspecified: I47.10

## 2022-02-02 ENCOUNTER — Inpatient Hospital Stay: Payer: Medicare PPO

## 2022-02-02 ENCOUNTER — Emergency Department: Payer: Medicare PPO

## 2022-02-02 ENCOUNTER — Inpatient Hospital Stay
Admission: EM | Admit: 2022-02-02 | Discharge: 2022-02-06 | DRG: 312 | Disposition: A | Discharge status: Home Health | Payer: Medicare PPO | Attending: Internal Medicine | Admitting: Internal Medicine

## 2022-02-02 ENCOUNTER — Other Ambulatory Visit: Payer: Self-pay

## 2022-02-02 DIAGNOSIS — Z79899 Other long term (current) drug therapy: Secondary | ICD-10-CM

## 2022-02-02 DIAGNOSIS — E1165 Type 2 diabetes mellitus with hyperglycemia: Secondary | ICD-10-CM | POA: Diagnosis not present

## 2022-02-02 DIAGNOSIS — E1142 Type 2 diabetes mellitus with diabetic polyneuropathy: Secondary | ICD-10-CM | POA: Diagnosis present

## 2022-02-02 DIAGNOSIS — Z955 Presence of coronary angioplasty implant and graft: Secondary | ICD-10-CM

## 2022-02-02 DIAGNOSIS — Z825 Family history of asthma and other chronic lower respiratory diseases: Secondary | ICD-10-CM

## 2022-02-02 DIAGNOSIS — Z6837 Body mass index (BMI) 37.0-37.9, adult: Secondary | ICD-10-CM

## 2022-02-02 DIAGNOSIS — T450X5A Adverse effect of antiallergic and antiemetic drugs, initial encounter: Secondary | ICD-10-CM | POA: Diagnosis present

## 2022-02-02 DIAGNOSIS — N390 Urinary tract infection, site not specified: Secondary | ICD-10-CM

## 2022-02-02 DIAGNOSIS — G4733 Obstructive sleep apnea (adult) (pediatric): Secondary | ICD-10-CM | POA: Diagnosis present

## 2022-02-02 DIAGNOSIS — Z96653 Presence of artificial knee joint, bilateral: Secondary | ICD-10-CM | POA: Diagnosis present

## 2022-02-02 DIAGNOSIS — F32A Depression, unspecified: Secondary | ICD-10-CM | POA: Diagnosis present

## 2022-02-02 DIAGNOSIS — Z794 Long term (current) use of insulin: Secondary | ICD-10-CM

## 2022-02-02 DIAGNOSIS — Z8249 Family history of ischemic heart disease and other diseases of the circulatory system: Secondary | ICD-10-CM

## 2022-02-02 DIAGNOSIS — N179 Acute kidney failure, unspecified: Secondary | ICD-10-CM | POA: Diagnosis not present

## 2022-02-02 DIAGNOSIS — R55 Syncope and collapse: Secondary | ICD-10-CM | POA: Diagnosis not present

## 2022-02-02 DIAGNOSIS — I161 Hypertensive emergency: Secondary | ICD-10-CM | POA: Diagnosis not present

## 2022-02-02 DIAGNOSIS — Z8711 Personal history of peptic ulcer disease: Secondary | ICD-10-CM

## 2022-02-02 DIAGNOSIS — Z833 Family history of diabetes mellitus: Secondary | ICD-10-CM

## 2022-02-02 DIAGNOSIS — K219 Gastro-esophageal reflux disease without esophagitis: Secondary | ICD-10-CM | POA: Diagnosis present

## 2022-02-02 DIAGNOSIS — R4182 Altered mental status, unspecified: Secondary | ICD-10-CM | POA: Diagnosis present

## 2022-02-02 DIAGNOSIS — Z7951 Long term (current) use of inhaled steroids: Secondary | ICD-10-CM

## 2022-02-02 DIAGNOSIS — E785 Hyperlipidemia, unspecified: Secondary | ICD-10-CM | POA: Diagnosis present

## 2022-02-02 DIAGNOSIS — D638 Anemia in other chronic diseases classified elsewhere: Secondary | ICD-10-CM | POA: Diagnosis present

## 2022-02-02 DIAGNOSIS — I10 Essential (primary) hypertension: Secondary | ICD-10-CM

## 2022-02-02 DIAGNOSIS — B9620 Unspecified Escherichia coli [E. coli] as the cause of diseases classified elsewhere: Secondary | ICD-10-CM | POA: Diagnosis present

## 2022-02-02 DIAGNOSIS — E669 Obesity, unspecified: Secondary | ICD-10-CM | POA: Diagnosis present

## 2022-02-02 DIAGNOSIS — Z888 Allergy status to other drugs, medicaments and biological substances status: Secondary | ICD-10-CM

## 2022-02-02 DIAGNOSIS — I480 Paroxysmal atrial fibrillation: Secondary | ICD-10-CM | POA: Diagnosis present

## 2022-02-02 DIAGNOSIS — J449 Chronic obstructive pulmonary disease, unspecified: Secondary | ICD-10-CM | POA: Diagnosis present

## 2022-02-02 DIAGNOSIS — Z7982 Long term (current) use of aspirin: Secondary | ICD-10-CM

## 2022-02-02 DIAGNOSIS — G9349 Other encephalopathy: Secondary | ICD-10-CM | POA: Diagnosis present

## 2022-02-02 DIAGNOSIS — F419 Anxiety disorder, unspecified: Secondary | ICD-10-CM | POA: Diagnosis present

## 2022-02-02 DIAGNOSIS — I2510 Atherosclerotic heart disease of native coronary artery without angina pectoris: Secondary | ICD-10-CM | POA: Diagnosis present

## 2022-02-02 DIAGNOSIS — E876 Hypokalemia: Secondary | ICD-10-CM | POA: Diagnosis not present

## 2022-02-02 LAB — URINALYSIS, ROUTINE W REFLEX MICROSCOPIC
Bilirubin Urine: NEGATIVE
Glucose, UA: NEGATIVE mg/dL
Hgb urine dipstick: NEGATIVE
Ketones, ur: NEGATIVE mg/dL
Nitrite: POSITIVE — AB
Protein, ur: NEGATIVE mg/dL
Specific Gravity, Urine: 1.008 (ref 1.005–1.030)
Squamous Epithelial / HPF: NONE SEEN (ref 0–5)
pH: 6 (ref 5.0–8.0)

## 2022-02-02 LAB — BASIC METABOLIC PANEL
Anion gap: 9 (ref 5–15)
BUN: 12 mg/dL (ref 8–23)
CO2: 24 mmol/L (ref 22–32)
Calcium: 9 mg/dL (ref 8.9–10.3)
Chloride: 105 mmol/L (ref 98–111)
Creatinine, Ser: 0.93 mg/dL (ref 0.44–1.00)
GFR, Estimated: >60 mL/min (ref >60)
Glucose, Bld: 155 mg/dL — ABNORMAL HIGH (ref 70–99)
Potassium: 3.8 mmol/L (ref 3.5–5.1)
Sodium: 138 mmol/L (ref 135–145)

## 2022-02-02 LAB — CBC
HCT: 44.3 % (ref 36.0–46.0)
Hemoglobin: 13.6 g/dL (ref 12.0–15.0)
MCH: 26.8 pg (ref 26.0–34.0)
MCHC: 30.7 g/dL (ref 30.0–36.0)
MCV: 87.2 fL (ref 80.0–100.0)
Platelets: 207 10*3/uL (ref 150–400)
RBC: 5.08 MIL/uL (ref 3.87–5.11)
RDW: 14.4 % (ref 11.5–15.5)
WBC: 7.6 10*3/uL (ref 4.0–10.5)
nRBC: 0 % (ref 0.0–0.2)

## 2022-02-02 LAB — TROPONIN I (HIGH SENSITIVITY): Troponin I (High Sensitivity): 4 ng/L (ref <18)

## 2022-02-02 MED ORDER — ACETAMINOPHEN 325 MG PO TABS: 650.0000 mg | ORAL_TABLET | Freq: Four times a day (QID) | ORAL | Status: DC | PRN

## 2022-02-02 MED ORDER — CLONIDINE HCL 0.1 MG PO TABS
0.2000 mg | ORAL_TABLET | Freq: Once | ORAL | Status: AC
  Administered 2022-02-02: 0.2 mg via ORAL
  Filled 2022-02-02: qty 2

## 2022-02-02 MED ORDER — ONDANSETRON HCL 4 MG PO TABS: 4.0000 mg | ORAL_TABLET | Freq: Four times a day (QID) | ORAL | Status: DC | PRN

## 2022-02-02 MED ORDER — VERAPAMIL HCL ER 300 MG PO CP24: 1.0000 | ORAL_CAPSULE | Freq: Every day | ORAL | Status: DC

## 2022-02-02 MED ORDER — LOSARTAN POTASSIUM 50 MG PO TABS
100.0000 mg | ORAL_TABLET | Freq: Every day | ORAL | Status: DC
  Administered 2022-02-03 – 2022-02-05 (×3): 100 mg via ORAL
  Filled 2022-02-02 (×3): qty 2

## 2022-02-02 MED ORDER — ONDANSETRON HCL 4 MG/2ML IJ SOLN
4.0000 mg | Freq: Four times a day (QID) | INTRAMUSCULAR | Status: DC | PRN
  Administered 2022-02-04 – 2022-02-05 (×2): 4 mg via INTRAVENOUS
  Filled 2022-02-02 (×2): qty 2

## 2022-02-02 MED ORDER — SODIUM CHLORIDE 0.9% FLUSH
3.0000 mL | Freq: Two times a day (BID) | INTRAVENOUS | Status: DC
  Administered 2022-02-03 – 2022-02-06 (×7): 3 mL via INTRAVENOUS

## 2022-02-02 MED ORDER — ATORVASTATIN CALCIUM 20 MG PO TABS
40.0000 mg | ORAL_TABLET | Freq: Every evening | ORAL | Status: DC
  Administered 2022-02-03 – 2022-02-05 (×3): 40 mg via ORAL
  Filled 2022-02-02 (×4): qty 2

## 2022-02-02 MED ORDER — ALBUTEROL SULFATE (2.5 MG/3ML) 0.083% IN NEBU: 2.5000 mg | INHALATION_SOLUTION | Freq: Four times a day (QID) | RESPIRATORY_TRACT | Status: DC | PRN

## 2022-02-02 MED ORDER — INSULIN GLARGINE-YFGN 100 UNIT/ML ~~LOC~~ SOLN
12.0000 [IU] | Freq: Every day | SUBCUTANEOUS | Status: DC
  Administered 2022-02-03 – 2022-02-06 (×4): 12 [IU] via SUBCUTANEOUS
  Filled 2022-02-02 (×4): qty 0.12

## 2022-02-02 MED ORDER — SODIUM CHLORIDE 0.9 % IV BOLUS
500.0000 mL | Freq: Once | INTRAVENOUS | Status: AC
  Administered 2022-02-02: 500 mL via INTRAVENOUS

## 2022-02-02 MED ORDER — FLUTICASONE FUROATE-VILANTEROL 100-25 MCG/ACT IN AEPB
1.0000 | INHALATION_SPRAY | Freq: Every day | RESPIRATORY_TRACT | Status: DC
  Administered 2022-02-03 – 2022-02-06 (×4): 1 via RESPIRATORY_TRACT
  Filled 2022-02-02: qty 28

## 2022-02-02 MED ORDER — ACETAMINOPHEN 650 MG RE SUPP: 650.0000 mg | Freq: Four times a day (QID) | RECTAL | Status: DC | PRN

## 2022-02-02 MED ORDER — ASPIRIN 81 MG PO TBEC
81.0000 mg | DELAYED_RELEASE_TABLET | Freq: Every day | ORAL | Status: DC
  Administered 2022-02-03 – 2022-02-06 (×4): 81 mg via ORAL
  Filled 2022-02-02 (×4): qty 1

## 2022-02-02 MED ORDER — HYDRALAZINE HCL 20 MG/ML IJ SOLN: 5.0000 mg | Freq: Once | INTRAMUSCULAR | Status: DC

## 2022-02-02 MED ORDER — INSULIN ASPART 100 UNIT/ML IJ SOLN
0.0000 [IU] | Freq: Three times a day (TID) | INTRAMUSCULAR | Status: DC
  Administered 2022-02-03: 5 [IU] via SUBCUTANEOUS
  Administered 2022-02-04 (×2): 2 [IU] via SUBCUTANEOUS
  Administered 2022-02-04: 3 [IU] via SUBCUTANEOUS
  Administered 2022-02-05: 5 [IU] via SUBCUTANEOUS
  Administered 2022-02-05: 2 [IU] via SUBCUTANEOUS
  Administered 2022-02-05: 1 [IU] via SUBCUTANEOUS
  Administered 2022-02-06: 3 [IU] via SUBCUTANEOUS
  Administered 2022-02-06: 2 [IU] via SUBCUTANEOUS
  Filled 2022-02-02 (×9): qty 1

## 2022-02-02 MED ORDER — PANTOPRAZOLE SODIUM 20 MG PO TBEC
20.0000 mg | DELAYED_RELEASE_TABLET | Freq: Two times a day (BID) | ORAL | Status: DC
  Administered 2022-02-03 – 2022-02-06 (×7): 20 mg via ORAL
  Filled 2022-02-02 (×8): qty 1

## 2022-02-02 MED ORDER — HEPARIN SODIUM (PORCINE) 5000 UNIT/ML IJ SOLN
5000.0000 [IU] | Freq: Three times a day (TID) | INTRAMUSCULAR | Status: DC
  Administered 2022-02-03 – 2022-02-06 (×11): 5000 [IU] via SUBCUTANEOUS
  Filled 2022-02-02 (×11): qty 1

## 2022-02-02 MED ORDER — SODIUM CHLORIDE 0.9 % IV SOLN
1.0000 g | Freq: Once | INTRAVENOUS | Status: AC
  Administered 2022-02-02: 1 g via INTRAVENOUS
  Filled 2022-02-02: qty 10

## 2022-02-02 NOTE — H&P (Addendum)
History and Physical    MERIDETH BOSQUE MPN:[id number] DOB: 06/24/1935 DOA: 02/02/2022  PCP: Baxter Hire, MD  Patient coming from: home  I have personally briefly reviewed patient's old medical records in [place]  Chief Complaint: syncopal episode x 3  HPI: Brandi [name] is a 86 y.o. female with medical history significant of Anemia ,anxiety , hx of vaso vagal syncope, recurrent UTI followed by [facility],depression,DMII, Pafib s/p LAA clipping, GERD, HA , HLD, HTN,Obesity , OSA on CPAP, CAD  s/p stent to LAD s/p CABG,PUD, Spinal stenosis, who presents to ED BIB EMS s/p recurrent syncopal episodes at home. Per EMS patient had 2 short lived syncopal episodes en route ,and vitals were noted to be stable throughout episodes. Per patient she has had multiple episodes of syncope in setting of illness such as UTI. Per daughter episodes over the last few years have become more frequent. Per patient she states before episode she had abdominal pain, she also endorses associated nausea with emesis after 1st episode of syncope. She denies any HA, focal weakness, fever/chills/ dysuria, frequency, hematuria, blood in stools or black stools.  She currently states she feels back to her baseline.     ED Course:  Afeb, BP 149/63 -231/74(144/65), Hr 68, hr 70, rr 18, sat 100%   EKG: nsr , noted t wave changed in inferior lead changed from prior ekg notes in system.   Labs: Wbc 7.6, hgb 13.6 ,plt 207 ,  NA 138, K 3.8, cr 0.93 Ce4 Ua: + wbc 21-50 +nitrite ,many bacteria CTH: IMPRESSION: Chronic ischemic changes are noted.  No acute abnormality noted. Tx  clonidine ,ctx Review of Systems: As per HPI otherwise 10 point review of systems negative.   Past Medical History:  Diagnosis Date   Anemia    Anxiety    Arthritis, degenerative    Chronic cystitis    Depression    Diabetes mellitus without complication (HCC)    GERD (gastroesophageal reflux disease)    Headache    Heart disease    HLD  (hyperlipidemia)    HTN (hypertension)    Hypertension    Incontinence in female    Murmur    Obesity    Sleep apnea    Urge incontinence of urine     Past Surgical History:  Procedure Laterality Date   BREAST BIOPSY Right 2004   neg   BUNIONECTOMY     CARDIAC SURGERY     Stent   CHOLECYSTECTOMY     COLONOSCOPY WITH PROPOFOL N/A 06/14/2017   Procedure: COLONOSCOPY WITH PROPOFOL;  Surgeon: Lollie Sails, MD;  Location: [facility] ENDOSCOPY;  Service: Endoscopy;  Laterality: N/A;   JOINT REPLACEMENT     TKR Bilateral   LEFT HEART CATH AND CORONARY ANGIOGRAPHY N/A 05/22/2018   Procedure: LEFT HEART CATH AND CORONARY ANGIOGRAPHY;  Surgeon: Corey Skains, MD;  Location: [facility];  Service: Cardiovascular;  Laterality: N/A;   LOOP RECORDER INSERTION N/A 09/20/2016   Procedure: Loop Recorder Insertion;  Surgeon: Isaias Cowman, MD;  Location: [facility];  Service: Cardiovascular;  Laterality: N/A;   SEPTOPLASTY     XI ROBOTIC ASSISTED INGUINAL HERNIA REPAIR WITH MESH Left 10/14/2020   Procedure: XI ROBOTIC ASSISTED INGUINAL HERNIA REPAIR WITH MESH;  Surgeon: Ronny Bacon, MD;  Location: [facility] ORS;  Service: General;  Laterality: Left;     reports that she has never smoked. She has never used smokeless tobacco. She reports that she does  not drink alcohol and does not use drugs.  Allergies  Allergen Reactions   Accupril [Quinapril Hcl] Rash   Micardis [Telmisartan] Other (See Comments)    Dizziness   Quinapril Rash    Family History  Problem Relation Age of Onset   Breast cancer Cousin    Skin cancer Other    Diabetes Other    Hypercholesterolemia Other    Hypertension Other    Bladder Cancer Maternal Aunt    COPD Other    Kidney disease Neg Hx    Kidney cancer Neg Hx     Prior to Admission medications   Medication Sig Start Date End Date Taking? Authorizing Provider  acetaminophen (TYLENOL) 325 MG tablet Take 650 mg by mouth every 6 (six)  hours as needed.    [provider]  albuterol (VENTOLIN HFA) 108 (90 Base) MCG/ACT inhaler Inhale 1-2 puffs into the lungs every 6 (six) hours as needed for wheezing or shortness of breath.    [provider]  aspirin 81 MG EC tablet Take 81 mg by mouth daily.     [provider]  atorvastatin (LIPITOR) 40 MG tablet Take 40 mg by mouth every evening. 08/05/19   [provider]  budesonide-formoterol (SYMBICORT) 80-4.5 MCG/ACT inhaler Inhale 2 puffs into the lungs 2 (two) times daily.    [provider]  Calcium Carb-Cholecalciferol (CALCIUM + D3 PO) Take 1 tablet by mouth daily.    [provider]  carboxymethylcellulose (REFRESH PLUS) 0.5 % SOLN Apply 1-2 drops to eye at bedtime.     [provider]  carvedilol (COREG) 12.5 MG tablet Take 12.5 mg by mouth 2 (two) times daily. 02/07/20   [provider]  Cyanocobalamin (VITAMIN B-12 PO) Take 1 tablet by mouth daily.    [provider]  famotidine (PEPCID) 20 MG tablet Take 20 mg by mouth 2 (two) times daily.    [provider]  gabapentin (NEURONTIN) 100 MG capsule Take 200 mg by mouth 2 (two) times daily. 01/21/20   [provider]  gabapentin (NEURONTIN) 400 MG capsule Take 400 mg by mouth in the morning and at bedtime. 06/27/21   [provider]  glipiZIDE (GLUCOTROL) 5 MG tablet Take 5 mg by mouth daily. 08/19/13   [provider]  Insulin Glargine (LANTUS SOLOSTAR) 100 UNIT/ML Solostar Pen Inject 12 Units into the skin daily. 07/20/15   [provider]  JANUMET XR 50-1000 MG TB24 Take 1 tablet by mouth 2 (two) times daily. 10/02/18   [provider]  losartan (COZAAR) 100 MG tablet Take 100 mg by mouth daily.    [provider]  MAGNESIUM-OXIDE 400 (240 Mg) MG tablet Take 1 tablet by mouth daily. 06/03/21   [provider]  meclizine (ANTIVERT) 12.5 MG tablet Take 12.5 mg by mouth 3 (three) times daily.  07/18/21   [provider]  Melatonin 10 MG TABS Take 1 tablet by mouth at bedtime.    [provider]  Multiple Vitamins-Minerals (PRESERVISION/LUTEIN) CAPS Take 1 capsule by mouth 2 (two) times daily. 03/29/20   Ezekiel Slocumb, DO  MYRBETRIQ 25 MG TB24 tablet TAKE 1 TABLET(25 MG) BY MOUTH DAILY 05/19/21   Ernestine Conrad, Larene Beach A, PA-C  naproxen (NAPROSYN) 500 MG tablet Take 1 tablet (500 mg total) by mouth 2 (two) times daily with a meal. 07/24/21   Triplett, Cari B, FNP  nortriptyline (PAMELOR) 25 MG capsule Take 25 mg by mouth at bedtime. 03/13/20   [provider]  pantoprazole (PROTONIX) 20 MG tablet Take 20 mg by mouth 2 (two) times daily. 02/20/20   [provider]  pregabalin (LYRICA) 75 MG capsule Take 1 capsule (75 mg total) by mouth 2 (two) times daily. 03/29/20   Ezekiel Slocumb, DO  senna-docusate (SENOKOT-S) 8.6-50 MG tablet Take 1 tablet by mouth at bedtime as needed for mild constipation. 03/29/20   Ezekiel Slocumb, DO  sodium chloride (OCEAN) 0.65 % nasal spray Place 1 spray into the nose 2 (two) times daily.    [provider]  traZODone (DESYREL) 50 MG tablet Take 1 tablet (50 mg total) by mouth at bedtime as needed for sleep. 03/29/20   Nicole Kindred A, DO  triamcinolone cream (KENALOG) 0.5 % Apply 1 application topically 2 (two) times daily. 04/01/20   [provider]  venlafaxine (EFFEXOR) 37.5 MG tablet Take 37.5 mg by mouth 2 (two) times daily. 01/21/20   [provider]  Verapamil HCl CR 300 MG CP24 Take 1 capsule by mouth daily.    [provider]    Physical Exam: Vitals:   02/02/22 1930 02/02/22 1959 02/02/22 2101 02/02/22 2124  BP: (!) 228/89   (!) 159/70  Pulse: 74  68 71  Resp: 15  15 (!) 21  Temp:  98.2 F (36.8 C)    TempSrc:  Oral    SpO2: 98%  98% 100%  Weight:      Height:         Vitals:   02/02/22 1930 02/02/22 1959 02/02/22 2101 02/02/22 2124  BP: (!) 228/89   (!) 159/70   Pulse: 74  68 71  Resp: 15  15 (!) 21  Temp:  98.2 F (36.8 C)    TempSrc:  Oral    SpO2: 98%  98% 100%  Weight:      Height:      Constitutional: NAD, calm, comfortable Eyes: PERRL, lids and conjunctivae normal ENMT: Mucous membranes are moist. Posterior pharynx clear of any exudate or lesions.Normal dentition.  Neck: normal, supple, no masses, no thyromegaly Respiratory: clear to auscultation bilaterally, no wheezing, no crackles. Normal respiratory effort. No accessory muscle use.  Cardiovascular: Regular rate and rhythm, no murmurs / rubs / gallops. No extremity edema. 2+ pedal pulses. No carotid bruits.  Abdomen: no tenderness, no masses palpated. No hepatosplenomegaly. Bowel sounds positive.  Musculoskeletal: no clubbing / cyanosis. No joint deformity upper and lower extremities. Good ROM, no contractures. Normal muscle tone.  Skin: no rashes, lesions, ulcers. No induration Neurologic: CN 2-12 grossly intact. Sensation intact, Strength 5/5 in all 4.  Psychiatric: Normal judgment and insight. Alert and oriented x 3. Normal mood.    Labs on Admission: I have personally reviewed following labs and imaging studies  CBC: Recent Labs  Lab 02/02/22 1217  WBC 7.6  HGB 13.6  HCT 44.3  MCV 87.2  PLT 401   Basic Metabolic Panel: Recent Labs  Lab 02/02/22 1217  NA 138  K 3.8  CL 105  CO2 24  GLUCOSE 155*  BUN 12  CREATININE 0.93  CALCIUM 9.0   GFR: Estimated Creatinine Clearance: 45.7 mL/min (by C-G formula based on SCr of 0.93 mg/dL). Liver Function Tests: No results for input(s): "AST", "ALT", "ALKPHOS", "BILITOT", "PROT", "ALBUMIN" in the last 168 hours. No results for input(s): "LIPASE", "AMYLASE" in the last 168 hours. No results for input(s): "AMMONIA" in the last 168 hours. Coagulation Profile: No results for input(s): "INR", "PROTIME" in the last 168  hours. Cardiac Enzymes: No results for input(s): "CKTOTAL", "CKMB", "CKMBINDEX", "TROPONINI" in the last 168  hours. BNP (last 3 results) No results for input(s): "PROBNP" in the last 8760 hours. HbA1C: No results for input(s): "HGBA1C" in the last 72 hours. CBG: No results for input(s): "GLUCAP" in the last 168 hours. Lipid Profile: No results for input(s): "CHOL", "HDL", "LDLCALC", "TRIG", "CHOLHDL", "LDLDIRECT" in the last 72 hours. Thyroid Function Tests: No results for input(s): "TSH", "T4TOTAL", "FREET4", "T3FREE", "THYROIDAB" in the last 72 hours. Anemia Panel: No results for input(s): "VITAMINB12", "FOLATE", "FERRITIN", "TIBC", "IRON", "RETICCTPCT" in the last 72 hours. Urine analysis:    Component Value Date/Time   COLORURINE YELLOW (A) 02/02/2022 1930   APPEARANCEUR CLEAR (A) 02/02/2022 1930   APPEARANCEUR Cloudy (A) 09/20/2017 1106   LABSPEC 1.008 02/02/2022 1930   LABSPEC 1.010 02/22/2012 1204   PHURINE 6.0 02/02/2022 1930   GLUCOSEU NEGATIVE 02/02/2022 1930   GLUCOSEU Negative 02/22/2012 1204   HGBUR NEGATIVE 02/02/2022 1930   BILIRUBINUR NEGATIVE 02/02/2022 1930   BILIRUBINUR Negative 09/20/2017 1106   BILIRUBINUR Negative 02/22/2012 1204   KETONESUR NEGATIVE 02/02/2022 1930   PROTEINUR NEGATIVE 02/02/2022 1930   NITRITE POSITIVE (A) 02/02/2022 1930   LEUKOCYTESUR LARGE (A) 02/02/2022 1930   LEUKOCYTESUR 2+ 02/22/2012 1204    Radiological Exams on Admission: CT Head Wo Contrast  Result Date: 02/02/2022 CLINICAL DATA:  Recent syncopal episode EXAM: CT HEAD WITHOUT CONTRAST TECHNIQUE: Contiguous axial images were obtained from the base of the skull through the vertex without intravenous contrast. RADIATION DOSE REDUCTION: This exam was performed according to the departmental dose-optimization program which includes automated exposure control, adjustment of the mA and/or kV according to patient size and/or use of iterative reconstruction technique. COMPARISON:  07/24/2021 FINDINGS: Brain: No evidence of acute infarction, hemorrhage, hydrocephalus, extra-axial collection or  mass lesion/mass effect. Chronic white matter ischemic changes are noted. Vascular: No hyperdense vessel or unexpected calcification. Skull: Normal. Negative for fracture or focal lesion. Sinuses/Orbits: No acute finding. Other: None. IMPRESSION: Chronic ischemic changes are noted.  No acute abnormality noted. Electronically Signed   By: Inez Catalina M.D.   On: 02/02/2022 20:12    EKG: Independently reviewed. See above  Assessment/Plan  Syncope  -prior hx of recurrent syncope :  thought to be vasovagal  -s/p LINK monitor, followed by Dr Corky Sox, currently monitor nonfunctional  -patient states had abdominal pain before initial onset of symptoms  -3 total episodes  -ekg nsr ,? Nonspecific t wave changes, ce wnl at 4  -cxr: pending  -CTH: neg  -neuro exam non-focal  -admit to cardiac tele  -place on syncope protocol  - possible cardiac in origin:continue to cycle ce, echo in am , monitor on tele,due to prior history will also consult cardiology  -possible vasovagal /in setting of uti -possible orthostatic bp labile in ed-check orthostatics  -to be complete f/u with Mri as CTH although no acute findings does note chronic ischemic changes.  -place on neuro checks , fall precaution -consider cardiology consult based on result of above testing /symptoms  UTI -f/u on urine culture -CTX   Uncontrolled HTN  -s/p clonidine and even medications noted improvement  -resume home regimen verapamil '300mg'$    CAD s/p stent LAD s/p CABG -resume asa 81, statin   P afib  -currently in sinus -s/P  LAA clipping  - continue verapamil  -continue asa 81   OSA  -cpap qhs   GERD/PUD -resume home regimen   HLD -statin  DMII -resume home insulin lantus 12 u -hold oral hypoglycemic while in house  -iss/fs   Anemia -h/h stable   Anxiety  -resume home regimen    COPD-resume home inhaler , symbicort  DVT prophylaxis: heparin  Code Status: full Family Communication: daughter at  bedside Disposition Plan: patient  expected to be admitted greater than 2 midnights  Consults called: consider cardiology in am based result of evaluation Admission status:progressive care    Clance Boll MD [facility]   If 7PM-7AM, please contact night-coverage [email or web address] Password [facility]  02/02/2022, 9:54 PM

## 2022-02-02 NOTE — ED Notes (Signed)
Purewick applied.

## 2022-02-02 NOTE — ED Triage Notes (Addendum)
Pt. To ED from home via EMS. EMS reports pt. Had syncopal episode at home and again in their care. EMS states VS have been stable throughout. Pt. C/o RLQ abdominal pain this AM, and one episode vomiting after syncope. Pt. Has hx of syncopal episodes with no diagnosis of cause.

## 2022-02-02 NOTE — ED Notes (Signed)
Pt transported to CT ?

## 2022-02-02 NOTE — ED Provider Notes (Signed)
[facility] Provider Note    Event Date/Time   First MD Initiated Contact with Patient 02/02/22 [name]     (approximate)   History      HPI  Brandi [name] is a 86 y.o. female with history of diabetes, peripheral polyneuropathy, and and orthostatic hypotension who presents with several syncopal episodes earlier today.  The patient states that she had some abdominal pain and felt somewhat nauseous and unwell but did not have a prodrome of lightheadedness or diaphoresis.  After the first episode of syncope she vomited.  Subsequently she syncopized 2 more times with EMS.  The patient does not remember these episodes.  She states that for the last several hours she has been feeling fine.  She denies any ongoing abdominal pain or nausea.  She denies any chest pain, difficulty breathing, headache, or other acute symptoms today.     Physical Exam   Triage Vital Signs: ED Triage Vitals  Enc Vitals Group     BP 02/02/22 1211 (!) 171/54     Pulse Rate 02/02/22 1211 72     Resp 02/02/22 1211 18     Temp 02/02/22 1211 97.6 F (36.4 C)     Temp Source 02/02/22 1211 Oral     SpO2 02/02/22 1211 95 %     Weight 02/02/22 1212 198 lb (89.8 kg)     Height 02/02/22 1212 5' (1.524 m)     Head Circumference --      Peak Flow --      Pain Score 02/02/22 1212 0     Pain Loc --      Pain Edu? --      Excl. in [place]? --     Most recent vital signs: Vitals:   02/02/22 2101 02/02/22 2124  BP:  (!) 159/70  Pulse: 68 71  Resp: 15 (!) 21  Temp:    SpO2: 98% 100%     General: Alert and oriented, comfortable appearing. CV:  Good peripheral perfusion.   Resp:  Normal effort.  Abd:  Soft and nontender.  No distention.  Other:  Moist mucous membranes.  EOMI.  PERRLA.  Motor intact in all extremities.  Normal coordination.  No peripheral edema.   ED Results / Procedures / Treatments   Labs (all labs ordered are listed, but only abnormal results are displayed) Labs  Reviewed  BASIC METABOLIC PANEL - Abnormal; Notable for the following components:      Result Value   Glucose, Bld 155 (*)    All other components within normal limits  URINALYSIS, ROUTINE W REFLEX MICROSCOPIC - Abnormal; Notable for the following components:   Color, Urine YELLOW (*)    APPearance CLEAR (*)    Nitrite POSITIVE (*)    Leukocytes,Ua LARGE (*)    Bacteria, UA MANY (*)    All other components within normal limits  CBC  CBG MONITORING, ED  TROPONIN I (HIGH SENSITIVITY)  TROPONIN I (HIGH SENSITIVITY)     EKG  ED ECG REPORT I, Arta Silence, the attending physician, personally viewed and interpreted this ECG.  Date: 02/02/2022 EKG Time: 1215 Rate: 75 Rhythm: normal sinus rhythm QRS Axis: normal Intervals: normal ST/T Wave abnormalities: Nonspecific abnormalities Narrative Interpretation: Nonspecific abnormalities with no evidence of acute ischemia; no significant change when compared to EKG of 07/24/2021    RADIOLOGY    PROCEDURES:  Critical Care performed: No  Procedures   MEDICATIONS ORDERED IN ED: Medications  sodium chloride 0.9 %  bolus 500 mL (0 mLs Intravenous Stopped 02/02/22 2124)  cloNIDine (CATAPRES) tablet 0.2 mg (0.2 mg Oral Given 02/02/22 1925)  cefTRIAXone (ROCEPHIN) 1 g in sodium chloride 0.9 % 100 mL IVPB (1 g Intravenous New Bag/Given 02/02/22 2122)     IMPRESSION / MDM / ASSESSMENT AND PLAN / ED COURSE  I reviewed the triage vital signs and the nursing notes.  86 year old female with PMH as noted above presents with multiple syncopal episodes earlier today with a minimal prodrome.  She is asymptomatic currently.  Physical exam is unremarkable except that she is not significant hypertensive although she states she did take some of her blood pressure medication today.  I reviewed the past medical records.  Per the discharge summary from 10/15/2020 the patient was admitted at that time after having abdominal pain and syncope and  being diagnosed with an inguinal hernia which required surgery.  She was admitted in 2021 with AKI after presenting with syncope as well.  Differential diagnosis includes, but is not limited to, vasovagal B, orthostatic hypotension, other autonomic disturbance, dehydration/hypovolemia, UTI or other infection, less likely primary cardiac or CNS cause.  Patient's presentation is most consistent with acute presentation with potential threat to life or bodily function.  We will obtain lab work-up, CT head, urinalysis, and reassess.  I will give p.o. clonidine initially for the blood pressure.  The patient is on the cardiac monitor to evaluate for evidence of arrhythmia and/or significant heart rate changes.  ----------------------------------------- 10:00 PM on 02/02/2022 -----------------------------------------  Blood pressure is improved.  Urinalysis is consistent with UTI.  Given multiple syncopal episodes in the context of UTI I consulted the hospitalist; based on her discussion she agrees to admit the patient.   FINAL CLINICAL IMPRESSION(S) / ED DIAGNOSES   Final diagnoses:  Syncope, unspecified syncope type  Urinary tract infection without hematuria, site unspecified     Rx / DC Orders   ED Discharge Orders     None        Note:  This document was prepared using Dragon voice recognition software and may include unintentional dictation errors.    Arta Silence, MD 02/02/22 2200

## 2022-02-02 NOTE — ED Notes (Signed)
Hospitalist team at bedside.

## 2022-02-03 ENCOUNTER — Inpatient Hospital Stay: Admit: 2022-02-03 | Payer: Medicare PPO

## 2022-02-03 ENCOUNTER — Inpatient Hospital Stay
Admit: 2022-02-03 | Discharge: 2022-02-03 | Disposition: A | Discharge status: Home / Self Care | Payer: Medicare PPO | Attending: Internal Medicine | Admitting: Internal Medicine

## 2022-02-03 ENCOUNTER — Inpatient Hospital Stay: Payer: Medicare PPO

## 2022-02-03 ENCOUNTER — Encounter: Payer: Self-pay | Admitting: Internal Medicine

## 2022-02-03 DIAGNOSIS — N390 Urinary tract infection, site not specified: Secondary | ICD-10-CM

## 2022-02-03 DIAGNOSIS — R55 Syncope and collapse: Secondary | ICD-10-CM | POA: Diagnosis not present

## 2022-02-03 DIAGNOSIS — E669 Obesity, unspecified: Secondary | ICD-10-CM | POA: Diagnosis not present

## 2022-02-03 LAB — COMPREHENSIVE METABOLIC PANEL
ALT: 16 U/L (ref 0–44)
AST: 18 U/L (ref 15–41)
Albumin: 3.1 g/dL — ABNORMAL LOW (ref 3.5–5.0)
Alkaline Phosphatase: 63 U/L (ref 38–126)
Anion gap: 7 (ref 5–15)
BUN: 10 mg/dL (ref 8–23)
CO2: 26 mmol/L (ref 22–32)
Calcium: 8.5 mg/dL — ABNORMAL LOW (ref 8.9–10.3)
Chloride: 105 mmol/L (ref 98–111)
Creatinine, Ser: 0.93 mg/dL (ref 0.44–1.00)
GFR, Estimated: >60 mL/min (ref >60)
Glucose, Bld: 175 mg/dL — ABNORMAL HIGH (ref 70–99)
Potassium: 3.6 mmol/L (ref 3.5–5.1)
Sodium: 138 mmol/L (ref 135–145)
Total Bilirubin: 0.5 mg/dL (ref 0.3–1.2)
Total Protein: 5.9 g/dL — ABNORMAL LOW (ref 6.5–8.1)

## 2022-02-03 LAB — CBC
HCT: 41.3 % (ref 36.0–46.0)
Hemoglobin: 12.6 g/dL (ref 12.0–15.0)
MCH: 26.4 pg (ref 26.0–34.0)
MCHC: 30.5 g/dL (ref 30.0–36.0)
MCV: 86.6 fL (ref 80.0–100.0)
Platelets: 199 10*3/uL (ref 150–400)
RBC: 4.77 MIL/uL (ref 3.87–5.11)
RDW: 14.3 % (ref 11.5–15.5)
WBC: 5.8 10*3/uL (ref 4.0–10.5)
nRBC: 0 % (ref 0.0–0.2)

## 2022-02-03 LAB — CBC WITH DIFFERENTIAL/PLATELET
Abs Immature Granulocytes: 0.01 10*3/uL (ref 0.00–0.07)
Basophils Absolute: 0 10*3/uL (ref 0.0–0.1)
Basophils Relative: 1 %
Eosinophils Absolute: 0.3 10*3/uL (ref 0.0–0.5)
Eosinophils Relative: 6 %
HCT: 40.9 % (ref 36.0–46.0)
Hemoglobin: 12.8 g/dL (ref 12.0–15.0)
Immature Granulocytes: 0 %
Lymphocytes Relative: 35 %
Lymphs Abs: 1.7 10*3/uL (ref 0.7–4.0)
MCH: 26.6 pg (ref 26.0–34.0)
MCHC: 31.3 g/dL (ref 30.0–36.0)
MCV: 84.9 fL (ref 80.0–100.0)
Monocytes Absolute: 0.5 10*3/uL (ref 0.1–1.0)
Monocytes Relative: 9 %
Neutro Abs: 2.3 10*3/uL (ref 1.7–7.7)
Neutrophils Relative %: 49 %
Platelets: 206 10*3/uL (ref 150–400)
RBC: 4.82 MIL/uL (ref 3.87–5.11)
RDW: 14.2 % (ref 11.5–15.5)
WBC: 4.8 10*3/uL (ref 4.0–10.5)
nRBC: 0 % (ref 0.0–0.2)

## 2022-02-03 LAB — ECHOCARDIOGRAM COMPLETE
AR max vel: 1.09 cm2
AV Area VTI: 0.98 cm2
AV Area mean vel: 0.97 cm2
AV Mean grad: 8 mmHg
AV Peak grad: 12.7 mmHg
Ao pk vel: 1.78 m/s
Area-P 1/2: 4.46 cm2
Height: 60 in
S' Lateral: 2.91 cm
Weight: 3168 oz

## 2022-02-03 LAB — PHOSPHORUS: Phosphorus: 3.9 mg/dL (ref 2.5–4.6)

## 2022-02-03 LAB — TSH: TSH: 3.657 u[IU]/mL (ref 0.350–4.500)

## 2022-02-03 LAB — CBG MONITORING, ED
Glucose-Capillary: 72 mg/dL (ref 70–99)
Glucose-Capillary: 278 mg/dL — ABNORMAL HIGH (ref 70–99)
Glucose-Capillary: 105 mg/dL — ABNORMAL HIGH (ref 70–99)

## 2022-02-03 LAB — T4, FREE: Free T4: 0.81 ng/dL (ref 0.61–1.12)

## 2022-02-03 LAB — TROPONIN I (HIGH SENSITIVITY)
Troponin I (High Sensitivity): 6 ng/L (ref <18)
Troponin I (High Sensitivity): 5 ng/L (ref <18)

## 2022-02-03 LAB — HEMOGLOBIN A1C
Hgb A1c MFr Bld: 7.3 % — ABNORMAL HIGH (ref 4.8–5.6)
Mean Plasma Glucose: 162.81 mg/dL

## 2022-02-03 LAB — D-DIMER, QUANTITATIVE: D-Dimer, Quant: 0.55 ug/mL-FEU — ABNORMAL HIGH (ref 0.00–0.50)

## 2022-02-03 LAB — MAGNESIUM: Magnesium: 1.8 mg/dL (ref 1.7–2.4)

## 2022-02-03 MED ORDER — VERAPAMIL HCL ER 120 MG PO TBCR
120.0000 mg | EXTENDED_RELEASE_TABLET | Freq: Every day | ORAL | Status: DC
  Administered 2022-02-03: 120 mg via ORAL
  Filled 2022-02-03 (×2): qty 1

## 2022-02-03 MED ORDER — LABETALOL HCL 5 MG/ML IV SOLN
20.0000 mg | Freq: Once | INTRAVENOUS | Status: AC
Start: 2022-02-04 — End: 2022-02-03
  Administered 2022-02-03: 20 mg via INTRAVENOUS
  Filled 2022-02-03: qty 4

## 2022-02-03 MED ORDER — SODIUM CHLORIDE 0.9 % IV SOLN
1.0000 g | INTRAVENOUS | Status: DC
  Administered 2022-02-03 – 2022-02-05 (×3): 1 g via INTRAVENOUS
  Filled 2022-02-03: qty 10
  Filled 2022-02-03 (×2): qty 1

## 2022-02-03 MED ORDER — VERAPAMIL HCL ER 180 MG PO TBCR
180.0000 mg | EXTENDED_RELEASE_TABLET | Freq: Every day | ORAL | Status: DC
  Administered 2022-02-03: 180 mg via ORAL
  Filled 2022-02-03 (×2): qty 1

## 2022-02-03 NOTE — Progress Notes (Signed)
PROGRESS NOTE    Brandi [name]  XBJ:[id number] DOB: 1936-02-16 DOA: 02/02/2022 PCP: Baxter Hire, MD     Brief Narrative:   Brandi [name] is a 86 y.o. WF PMHx Anemia ,anxiety , hx of vaso vagal syncope, recurrent UTI followed by [place],depression,DMII, Paroxysmal -afib s/p LAA clipping, GERD, HA , HLD, HTN,Obesity , OSA on CPAP, CAD  s/p stent to LAD s/p CABG,PUD, Spinal stenosis, who    Presents to ED BIB EMS s/p recurrent syncopal episodes at home. Per EMS patient had 2 short lived syncopal episodes en route ,and vitals were noted to be stable throughout episodes. Per patient she has had multiple episodes of syncope in setting of illness such as UTI. Per daughter episodes over the last few years have become more frequent. Per patient she states before episode she had abdominal pain, she also endorses associated nausea with emesis after 1st episode of syncope. She denies any HA, focal weakness, fever/chills/ dysuria, frequency, hematuria, blood in stools or black stools.  She currently states she feels back to her baseline.      Subjective: 8/18 afebrile overnight however increasing BP.  Currently A/O x4.  States LOC x3 while sitting in her walker baking cakes.  Witnessed by her husband.  Husband states that believes first and third LOC> 5 minutes.  Patient states bladder pain yesterday however has resolved.  Negative dysuria.   Assessment & Plan: Covid vaccination;   Principal Problem:   Syncope Active Problems:   Obesity (BMI 30-39.9)  Recurrent Syncope -8/18 TSH, free T3, free T4 pending - 8/18 a.m. cortisol - 8/18 echocardiogram pending -S/p LINK monitor, followed by Dr Corky Sox, currently monitor nonfunctional  -neuro exam non-focal  --to be complete f/u with Mri as CTH although no acute findings does note chronic ischemic changes.  -place on neuro checks , fall precaution -consider cardiology consult based on result of above testing /symptoms   UTI -f/u on urine  culture -CTX  -Complete 5-day course antibiotics   Uncontrolled HTN  -s/p clonidine and even medications noted improvement  -resume home regimen verapamil '300mg'$     CAD s/p stent LAD s/p CABG -resume asa 81, statin    Paroxysmal atrial fibrillation -8/18 currently NSR  -s/P  LAA clipping  -see HTN -continue asa 81    OSA  -cpap qhs   COPD -resume home inhaler , symbicort   GERD/PUD -resume home regimen    HLD -statin  -8/18 lipid panel pending     DM type II uncontrolled with hyperglycemia -8/17 hemoglobin A1c= 7.3 -resume home insulin lantus 12 u -hold oral hypoglycemic while in house  -iss/fs     Anemia unspecified -8/18 anemia panel pending - Transfuse for hemoglobin<7   Anxiety  -resume home regimen      Obesity (BMI38.67 kg/m.)  -     Mobility Assessment (last 72 hours)     Mobility Assessment     Row Name 02/03/22 1425           What is the highest level of mobility based on the progressive mobility assessment? Level 5 (Walks with assist in room/hall) - Balance while stepping forward/back and can walk in room with assist - Complete                  DVT prophylaxis: Subcu heparin Code Status: Full Family Communication: 8/18 husband at bedside for discussion of plan of care all questions answered Status is: Inpatient    Dispo: The patient is from:  Home              Anticipated d/c is to: Home              Anticipated d/c date is: 2 days              Patient currently is not medically stable to d/c.      Consultants:    Procedures/Significant Events:    I have personally reviewed and interpreted all radiology studies and my findings are as above.  VENTILATOR SETTINGS:    Cultures   Antimicrobials:    Devices    LINES / TUBES:      Continuous Infusions:  cefTRIAXone (ROCEPHIN)  IV       Objective: Vitals:   02/03/22 0700 02/03/22 1000 02/03/22 1300 02/03/22 1700  BP: (!) 168/75 (!) 162/80 (!)  150/85 (!) 148/80  Pulse: 72 74 64 72  Resp: 14 (!) 21 (!) 27 (!) 22  Temp: 98.7 F (37.1 C) 98.5 F (36.9 C) 98.4 F (36.9 C) 98.5 F (36.9 C)  TempSrc: Oral Oral Oral Oral  SpO2: 96% 95% 96% 99%  Weight:      Height:        Intake/Output Summary (Last 24 hours) at 02/03/2022 2010 Last data filed at 02/03/2022 8676 Gross per 24 hour  Intake 600 ml  Output 700 ml  Net -100 ml   Filed Weights   02/02/22 1212  Weight: 89.8 kg    Examination:  General: A/O x4, No acute respiratory distress Eyes: negative scleral hemorrhage, negative anisocoria, negative icterus ENT: Negative Runny nose, negative gingival bleeding, some difficulty with hearing. Neck:  Negative scars, masses, torticollis, lymphadenopathy, JVD Lungs: Clear to auscultation bilaterally without wheezes or crackles Cardiovascular: Regular rate and rhythm without murmur gallop or rub normal S1 and S2 Abdomen: negative abdominal pain, nondistended, positive soft, bowel sounds, no rebound, no ascites, no appreciable mass, positive right CVA tenderness Extremities: No significant cyanosis, clubbing, or edema bilateral lower extremities Skin: Negative rashes, lesions, ulcers Psychiatric:  Negative depression, negative anxiety, negative fatigue, negative mania  Central nervous system:  Cranial nerves II through XII intact, tongue/uvula midline, all extremities muscle strength 5/5, sensation intact throughout, negative dysarthria, negative expressive aphasia, negative receptive aphasia.  .     Data Reviewed: Care during the described time interval was provided by me .  I have reviewed this patient's available data, including medical history, events of note, physical examination, and all test results as part of my evaluation.  CBC: Recent Labs  Lab 02/02/22 1217 02/03/22 0106 02/03/22 0928  WBC 7.6 5.8 4.8  NEUTROABS  --   --  2.3  HGB 13.6 12.6 12.8  HCT 44.3 41.3 40.9  MCV 87.2 86.6 84.9  PLT 207 199 195    Basic Metabolic Panel: Recent Labs  Lab 02/02/22 1217 02/03/22 0106 02/03/22 0928  NA 138 138  --   K 3.8 3.6  --   CL 105 105  --   CO2 24 26  --   GLUCOSE 155* 175*  --   BUN 12 10  --   CREATININE 0.93 0.93  --   CALCIUM 9.0 8.5*  --   MG  --   --  1.8  PHOS  --   --  3.9   GFR: Estimated Creatinine Clearance: 45.7 mL/min (by C-G formula based on SCr of 0.93 mg/dL). Liver Function Tests: Recent Labs  Lab 02/03/22 0106  AST 18  ALT 16  ALKPHOS 63  BILITOT 0.5  PROT 5.9*  ALBUMIN 3.1*   No results for input(s): "LIPASE", "AMYLASE" in the last 168 hours. No results for input(s): "AMMONIA" in the last 168 hours. Coagulation Profile: No results for input(s): "INR", "PROTIME" in the last 168 hours. Cardiac Enzymes: No results for input(s): "CKTOTAL", "CKMB", "CKMBINDEX", "TROPONINI" in the last 168 hours. BNP (last 3 results) No results for input(s): "PROBNP" in the last 8760 hours. HbA1C: Recent Labs    02/02/22 1217  HGBA1C 7.3*   CBG: Recent Labs  Lab 02/03/22 0806 02/03/22 1259 02/03/22 1615  GLUCAP 72 278* 105*   Lipid Profile: No results for input(s): "CHOL", "HDL", "LDLCALC", "TRIG", "CHOLHDL", "LDLDIRECT" in the last 72 hours. Thyroid Function Tests: Recent Labs    02/03/22 1042  TSH 3.657  FREET4 0.81   Anemia Panel: No results for input(s): "VITAMINB12", "FOLATE", "FERRITIN", "TIBC", "IRON", "RETICCTPCT" in the last 72 hours. Sepsis Labs: No results for input(s): "PROCALCITON", "LATICACIDVEN" in the last 168 hours.  No results found for this or any previous visit (from the past 240 hour(s)).       Radiology Studies: ECHOCARDIOGRAM COMPLETE  Result Date: 02/03/2022    ECHOCARDIOGRAM REPORT   Patient Name:   EVEE LISKA Date of Exam: 02/03/2022 Medical Rec #:  [id number]     Height:       60.0 in Accession #:    [id number]    Weight:       198.0 lb Date of Birth:  1935-12-05     BSA:          1.859 m Patient Age:    8 years      BP:            168/75 mmHg Patient Gender: F             HR:           81 bpm. Exam Location:  [facility] Procedure: 2D Echo, Color Doppler and Cardiac Doppler Indications:     R55 Syncope  History:         Patient has no prior history of Echocardiogram examinations.                  Prior CABG; Risk Factors:Hypertension, Diabetes, Dyslipidemia                  and Sleep Apnea.  Sonographer:     Charmayne Sheer Referring Phys:  [id number] SARA-MAIZ A THOMAS Diagnosing Phys: Isaias Cowman MD  Sonographer Comments: No subcostal window. IMPRESSIONS  1. Left ventricular ejection fraction, by estimation, is 55 to 60%. The left ventricle has normal function. The left ventricle has no regional wall motion abnormalities. There is mild left ventricular hypertrophy. Left ventricular diastolic parameters were normal.  2. Right ventricular systolic function is normal. The right ventricular size is normal.  3. The mitral valve is normal in structure. Mild mitral valve regurgitation. No evidence of mitral stenosis.  4. Tricuspid valve regurgitation is mild to moderate.  5. The aortic valve is normal in structure. Aortic valve regurgitation is not visualized. No aortic stenosis is present.  6. The inferior vena cava is normal in size with greater than 50% respiratory variability, suggesting right atrial pressure of 3 mmHg. FINDINGS  Left Ventricle: Left ventricular ejection fraction, by estimation, is 55 to 60%. The left ventricle has normal function. The left ventricle has no regional wall motion abnormalities. The left ventricular internal cavity size was normal in size. There  is  mild left ventricular hypertrophy. Left ventricular diastolic parameters were normal. Right Ventricle: The right ventricular size is normal. No increase in right ventricular wall thickness. Right ventricular systolic function is normal. Left Atrium: Left atrial size was normal in size. Right Atrium: Right atrial size was normal in size. Pericardium: There is no  evidence of pericardial effusion. Mitral Valve: The mitral valve is normal in structure. Mild mitral valve regurgitation. No evidence of mitral valve stenosis. Tricuspid Valve: The tricuspid valve is normal in structure. Tricuspid valve regurgitation is mild to moderate. No evidence of tricuspid stenosis. Aortic Valve: The aortic valve is normal in structure. Aortic valve regurgitation is not visualized. No aortic stenosis is present. Aortic valve mean gradient measures 8.0 mmHg. Aortic valve peak gradient measures 12.7 mmHg. Aortic valve area, by VTI measures 0.98 cm. Pulmonic Valve: The pulmonic valve was normal in structure. Pulmonic valve regurgitation is not visualized. No evidence of pulmonic stenosis. Aorta: The aortic root is normal in size and structure. Venous: The inferior vena cava is normal in size with greater than 50% respiratory variability, suggesting right atrial pressure of 3 mmHg. IAS/Shunts: No atrial level shunt detected by color flow Doppler.  LEFT VENTRICLE PLAX 2D LVIDd:         4.08 cm   Diastology LVIDs:         2.91 cm   LV e' medial:    5.98 cm/s LV PW:         1.58 cm   LV E/e' medial:  13.1 LV IVS:        0.89 cm   LV e' lateral:   6.96 cm/s LVOT diam:     1.80 cm   LV E/e' lateral: 11.3 LV SV:         40 LV SV Index:   21 LVOT Area:     2.54 cm  RIGHT VENTRICLE RV Basal diam:  2.46 cm LEFT ATRIUM           Index        RIGHT ATRIUM          Index LA diam:      3.30 cm 1.78 cm/m   RA Area:     8.53 cm LA Vol (A2C): 23.6 ml 12.70 ml/m  RA Volume:   14.30 ml 7.69 ml/m LA Vol (A4C): 51.9 ml 27.92 ml/m  AORTIC VALVE                     PULMONIC VALVE AV Area (Vmax):    1.09 cm      PV Vmax:       0.87 m/s AV Area (Vmean):   0.97 cm      PV Peak grad:  3.0 mmHg AV Area (VTI):     0.98 cm AV Vmax:           178.00 cm/s AV Vmean:          137.000 cm/s AV VTI:            0.404 m AV Peak Grad:      12.7 mmHg AV Mean Grad:      8.0 mmHg LVOT Vmax:         76.40 cm/s LVOT Vmean:         52.400 cm/s LVOT VTI:          0.156 m LVOT/AV VTI ratio: 0.39  AORTA Ao Root diam: 2.70 cm MITRAL VALVE  TRICUSPID VALVE MV Area (PHT): 4.46 cm     TR Peak grad:   23.0 mmHg MV Decel Time: 170 msec     TR Vmax:        240.00 cm/s MV E velocity: 78.40 cm/s MV A velocity: 120.00 cm/s  SHUNTS MV E/A ratio:  0.65         Systemic VTI:  0.16 m                             Systemic Diam: 1.80 cm Isaias Cowman MD Electronically signed by Isaias Cowman MD Signature Date/Time: 02/03/2022/1:35:24 PM    Final    MR BRAIN WO CONTRAST  Result Date: 02/03/2022 CLINICAL DATA:  Syncope EXAM: MRI HEAD WITHOUT CONTRAST TECHNIQUE: Multiplanar, multiecho pulse sequences of the brain and surrounding structures were obtained without intravenous contrast. COMPARISON:  None Available. FINDINGS: Brain: No acute infarct, mass effect or extra-axial collection. No acute or chronic hemorrhage. There is multifocal hyperintense T2-weighted signal within the white matter. Parenchymal volume and CSF spaces are normal. A partially empty sella is incidentally noted. Vascular: Major flow voids are preserved. Skull and upper cervical spine: Normal calvarium and skull base. Visualized upper cervical spine and soft tissues are normal. Sinuses/Orbits:No paranasal sinus fluid levels or advanced mucosal thickening. No mastoid or middle ear effusion. Normal orbits. IMPRESSION: 1. No acute intracranial abnormality. 2. Findings of chronic small vessel ischemia. Electronically Signed   By: Ulyses Jarred M.D.   On: 02/03/2022 01:11   X-ray chest PA and lateral  Result Date: 02/02/2022 CLINICAL DATA:  Syncopal episode. EXAM: CHEST - 2 VIEW COMPARISON:  July 24, 2021 FINDINGS: Multiple sternal wires and vascular clips are noted. The cardiac silhouette is mildly enlarged. A radiopaque loop recorder device is seen. There is marked severity calcification of the aortic arch. Both lungs are clear. The visualized skeletal structures  are unremarkable. IMPRESSION: 1. Stable cardiomegaly and evidence of prior median sternotomy/CABG. 2. No acute cardiopulmonary disease. Electronically Signed   By: Virgina Norfolk M.D.   On: 02/02/2022 23:30   CT Head Wo Contrast  Result Date: 02/02/2022 CLINICAL DATA:  Recent syncopal episode EXAM: CT HEAD WITHOUT CONTRAST TECHNIQUE: Contiguous axial images were obtained from the base of the skull through the vertex without intravenous contrast. RADIATION DOSE REDUCTION: This exam was performed according to the departmental dose-optimization program which includes automated exposure control, adjustment of the mA and/or kV according to patient size and/or use of iterative reconstruction technique. COMPARISON:  07/24/2021 FINDINGS: Brain: No evidence of acute infarction, hemorrhage, hydrocephalus, extra-axial collection or mass lesion/mass effect. Chronic white matter ischemic changes are noted. Vascular: No hyperdense vessel or unexpected calcification. Skull: Normal. Negative for fracture or focal lesion. Sinuses/Orbits: No acute finding. Other: None. IMPRESSION: Chronic ischemic changes are noted.  No acute abnormality noted. Electronically Signed   By: Inez Catalina M.D.   On: 02/02/2022 20:12        Scheduled Meds:  aspirin EC  81 mg Oral Daily   atorvastatin  40 mg Oral QPM   fluticasone furoate-vilanterol  1 puff Inhalation Daily   heparin  5,000 Units Subcutaneous Q8H   insulin aspart  0-9 Units Subcutaneous TID WC   insulin glargine-yfgn  12 Units Subcutaneous Daily   losartan  100 mg Oral Daily   pantoprazole  20 mg Oral BID   sodium chloride flush  3 mL Intravenous Q12H   verapamil  180 mg Oral  Daily   And   verapamil  120 mg Oral Daily   Continuous Infusions:  cefTRIAXone (ROCEPHIN)  IV       LOS: 1 day    Time spent:40 min    Glen Kesinger, Geraldo Docker, MD [facility]   If 7PM-7AM, please contact night-coverage 02/03/2022, 8:10 PM

## 2022-02-03 NOTE — ED Notes (Signed)
Pt back from MRI. This RN now assuming care.

## 2022-02-03 NOTE — Progress Notes (Signed)
*  PRELIMINARY RESULTS* Echocardiogram 2D Echocardiogram has been performed.  Brandi [name] 02/03/2022, 11:22 AM

## 2022-02-03 NOTE — TOC Initial Note (Signed)
Transition of Care [facility]) - Initial/Assessment Note    Patient Details  Name: Brandi [name] MRN: [id number] Date of Birth: 01-26-1936  [facility] of [facility]) CM/SW Contact:    Shelbie Hutching, RN Phone Number: 02/03/2022, 4:16 PM  Clinical Narrative:                 Patient placed under observation for syncopal episode.  RNCM met with patient at the bedside in the emergency room, patient's son, Brandi, is also at the bedside.  Patient is from home, she uses a rollator.  She reports just recently finishing up with home health PT through [facility].  PT eval from today recommends [place] again.  Patient agrees to having home health services set up again through [facility].  Kelsey with [facility] says they will accept her back.   Patient's son provides all of her transportation and can pick her up at discharge.   Expected Discharge Plan: [place] Barriers to Discharge: Continued Medical Work up   Patient Goals and CMS Choice Patient states their goals for this hospitalization and ongoing recovery are:: to get back home CMS Medicare.gov Compare Post Acute Care list provided to:: Patient Choice offered to / list presented to : Patient  Expected Discharge Plan and Services Expected Discharge Plan: [place]   Discharge Planning Services: CM Consult Post Acute Care Choice: [place] arrangements for the past 2 months: Single Family Home                 DME Arranged: N/A DME Agency: NA       HH Arranged: PT HH Agency: [facility] Date [place]: 02/03/22 Time [place]: 4315 Representative spoke with at [place]: Merleen Nicely  Prior Living Arrangements/Services Living arrangements for the past 2 months: [place] Lives with:: Self Patient language and need for interpreter reviewed:: Yes Do you feel safe going back to the place where you live?: Yes      Need for Family Participation in Patient Care: Yes  (Comment) Care giver support system in place?: Yes (comment) Current home services: DME (rollator) Criminal Activity/Legal Involvement Pertinent to Current Situation/Hospitalization: No - Comment as needed  Activities of Daily Living      Permission Sought/Granted Permission sought to share information with : Case Manager, Family Supports, Other (comment) Permission granted to share information with : Yes, Verbal Permission Granted  Share Information with NAME: Brandi [name] granted to share info w AGENCY: [facility]  Permission granted to share info w Relationship: son  Permission granted to share info w Contact Information: [phone]  Emotional Assessment Appearance:: Appears stated age Attitude/Demeanor/Rapport: Engaged Affect (typically observed): Accepting Orientation: : Oriented to Self, Oriented to Place, Oriented to  Time, Oriented to Situation Alcohol / Substance Use: Not Applicable Psych Involvement: No (comment)  Admission diagnosis:  Syncope [R55] Patient Active Problem List   Diagnosis Date Noted   Status post laparoscopic hernia repair 10/28/2020   AKI (acute kidney injury) ([place]) 04/13/2020   Acute hyponatremia 04/13/2020   Dehydration 04/13/2020   Diarrhea 04/13/2020   Abdominal pain 04/13/2020   Syncope 03/28/2020   CAD (coronary artery disease) 03/28/2020   Chronic kidney disease, stage III (moderate) ([place]) 02/12/2019   Background diabetic retinopathy associated with type 2 diabetes mellitus ([place]) 12/06/2018   Decreased activities of daily living (ADL) 06/05/2018   Decreased strength, endurance, and mobility 06/05/2018   S/P  CABG x 3 06/04/2018   Presence of drug coated stent in LAD coronary artery 05/27/2018   Anxiety 05/25/2018   Atrial fibrillation and flutter ([place]) 05/25/2018   H/O gastroesophageal reflux (GERD) 05/25/2018   Hyperlipidemia 05/25/2018   Unstable angina ([place]) 05/21/2018   Chest pain 05/20/2018   Sacroiliitis ([place])  02/21/2018   Positional lightheadedness 03/14/2017   Diabetes mellitus type 2, insulin dependent ([place]) 03/14/2017   Action tremor 09/11/2016   Headache disorder 09/11/2016   Severe sepsis ([place]) 04/14/2016   Orthostatic hypotension 11/10/2015   Diabetic neuropathy ([place]) 10/06/2015   Recurrent UTI 09/13/2015   Cystocele, grade 3 09/13/2015   Incontinence 09/13/2015   Depression, major, in remission ([place]) 12/23/2014   Pernicious anemia 12/23/2014   Coronary arteriosclerosis 01/06/2014   Sleep apnea in adult 01/06/2014   Benign essential hypertension 12/01/2013   Obesity, unspecified 12/01/2013   Acquired cyst of kidney 05/02/2013   Chronic cystitis 03/11/2012   Urge incontinence 03/11/2012   PCP:  Baxter Hire, MD Pharmacy:   [id number] Brandi [name], [address] AT [address] [place] [address] Phone: [phone] Fax: [phone]     Social Determinants of Health (SDOH) Interventions    Readmission Risk Interventions     No data to display

## 2022-02-03 NOTE — Care Management Obs Status (Signed)
[place] NOTIFICATION   Patient Details  Name: Brandi [name] MRN: [id number] Date of Birth: 1936/05/09   Medicare Observation Status Notification Given:  Yes    Shelbie Hutching, RN 02/03/2022, 3:55 PM

## 2022-02-03 NOTE — ED Notes (Signed)
Pt is currently in MRI but will be moved to ED rm 37. Pt's daughter and family were brought to the room.

## 2022-02-03 NOTE — Progress Notes (Signed)
PT Cancellation Note  Patient Details Name: Brandi [name] MRN: [id number] DOB: [birth year]   Cancelled Treatment:    Reason Eval/Treat Not Completed: Other (comment). Pt with staff at beside (lab, then echo) PT to re-attempt as able.   Lieutenant Diego PT, DPT 10:42 AM,02/03/22

## 2022-02-03 NOTE — Evaluation (Signed)
Physical Therapy Evaluation Patient Details Name: ODESSIA ASLESON MRN: [id number] DOB: 1935-10-10 Today's Date: 02/03/2022  History of Present Illness  Pt is an 86 y.o. female with medical history significant of Anemia ,anxiety , hx of vaso vagal syncope, recurrent UTI followed by Stoioff,depression,DMII, Pafib s/p LAA clipping, GERD, HA , HLD, HTN,Obesity , OSA on CPAP, CAD  s/p stent to LAD s/p CABG,PUD, Spinal stenosis, who presents to ED BIB EMS s/p recurrent syncopal episodes at home. Pt also informed PT of previously broken arm, brace not applied due to IV placement.   Clinical Impression  Pt alert, agreeable to PT, oriented to self, place, family in room, situation. Did inform PT of previous R arm fracture, see precaution section for details. The patient reported at baseline her son is available 24/7 for assistance, rollator for mobility, and he assists with ADLs (dressing, bathing, grooming) he performs IADLs, and also helps with stair navigation at baseline.   The patient was able to move all extremities against gravity. Supine to sit with minA (provided but not required). Several minutes spent in sitting with good balance due to pt complaints of dizziness that resolved with time. Sit <> stand with handheld assist/minA due to lack of walker. She was able to stand for several minutes and ambulate ~86f with CGA and pushing IV pole. Gait very slow, cautious, and pt self limiting due to reported dizziness. BP, spO2 and HR WFLs.  Overall the patient demonstrated deficits (see "PT Problem List") that impede the patient's functional abilities, safety, and mobility and would benefit from skilled PT intervention. Recommendation is HHPT (pt did recently finish with agency) to maximize safety and function.      Recommendations for follow up therapy are one component of a multi-disciplinary discharge planning process, led by the attending physician.  Recommendations may be updated based on patient status,  additional functional criteria and insurance authorization.  Follow Up Recommendations Home health PT      Assistance Recommended at Discharge Frequent or constant Supervision/Assistance  Patient can return home with the following  A little help with walking and/or transfers;A little help with bathing/dressing/bathroom;Assistance with cooking/housework;Assist for transportation;Direct supervision/assist for financial management;Direct supervision/assist for medications management;Help with stairs or ramp for entrance    Equipment Recommendations None recommended by PT  Recommendations for Other Services       Functional Status Assessment Patient has had a recent decline in their functional status and demonstrates the ability to make significant improvements in function in a reasonable and predictable amount of time.     Precautions / Restrictions Precautions Precautions: Fall Required Braces or Orthoses: Other Brace Other Brace: R wrist brace. unable to don due to IV placement Restrictions Weight Bearing Restrictions: Yes Other Position/Activity Restrictions: pt stated that she is to wear her brace when she is "up and moving in case she falls" per MD. RUE weight bearing minimized as able.      Mobility  Bed Mobility Overal bed mobility: Needs Assistance Bed Mobility: Supine to Sit, Sit to Supine     Supine to sit: Min assist, HOB elevated Sit to supine: Min guard   General bed mobility comments: minA for handheld assist to EOB, but not required    Transfers Overall transfer level: Needs assistance Equipment used: 1 person hand held assist Transfers: Sit to/from Stand Sit to Stand: Min assist           General transfer comment: minA for steadying due to lack or RW. pt normally utilizes locked  rollator at baseline    Ambulation/Gait Ambulation/Gait assistance: Min guard Gait Distance (Feet): 35 Feet Assistive device: IV Pole         General Gait Details:  slow, cautious. pt with intermittent dizziness that improved over time. HR, BP, spO2 all WFLs in standing  Stairs            Wheelchair Mobility    Modified Rankin (Stroke Patients Only)       Balance Overall balance assessment: Needs assistance Sitting-balance support: Feet supported Sitting balance-Leahy Scale: Good       Standing balance-Leahy Scale: Fair Standing balance comment: pt more comfortable with BUE support                             Pertinent Vitals/Pain Pain Assessment Pain Assessment: No/denies pain    Home Living Family/patient expects to be discharged to:: Private residence Living Arrangements: Children Available Help at Discharge: Family;Available 24 hours/day Type of Home: House Home Access: Stairs to enter Entrance Stairs-Rails: Right Entrance Stairs-Number of Steps: 3   Home Layout: Two level;Able to live on main level with bedroom/bathroom Home Equipment: Rollator (4 wheels);Shower seat      Prior Function Prior Level of Function : Needs assist       Physical Assist : ADLs (physical)   ADLs (physical): Bathing;Dressing;Toileting;Grooming;IADLs Mobility Comments: utilizes a rollator at all times. family normally assists with stairs to enter/exit the home       Hand Dominance   Dominant Hand: Right    Extremity/Trunk Assessment   Upper Extremity Assessment Upper Extremity Assessment: Generalized weakness    Lower Extremity Assessment Lower Extremity Assessment: Generalized weakness       Communication   Communication: No difficulties  Cognition Arousal/Alertness: Awake/alert Behavior During Therapy: WFL for tasks assessed/performed Overall Cognitive Status: Within Functional Limits for tasks assessed                                          General Comments      Exercises     Assessment/Plan    PT Assessment Patient needs continued PT services  PT Problem List Decreased  mobility;Decreased strength;Decreased balance;Decreased activity tolerance       PT Treatment Interventions DME instruction;Therapeutic exercise;Gait training;Balance training;Stair training;Neuromuscular re-education;Functional mobility training;Therapeutic activities;Patient/family education    PT Goals (Current goals can be found in the Care Plan section)  Acute Rehab PT Goals Patient Stated Goal: to go home PT Goal Formulation: With patient Time For Goal Achievement: 02/17/22 Potential to Achieve Goals: Good    Frequency Min 2X/week     Co-evaluation               AM-PAC PT "6 Clicks" Mobility  Outcome Measure Help needed turning from your back to your side while in a flat bed without using bedrails?: A Little Help needed moving from lying on your back to sitting on the side of a flat bed without using bedrails?: A Little Help needed moving to and from a bed to a chair (including a wheelchair)?: A Little Help needed standing up from a chair using your arms (e.g., wheelchair or bedside chair)?: A Little Help needed to walk in hospital room?: A Little Help needed climbing 3-5 steps with a railing? : A Little 6 Click Score: 18    End of Session Equipment Utilized During  Treatment: Gait belt Activity Tolerance: Patient tolerated treatment well Patient left: in bed;with call bell/phone within reach;with bed alarm set;with family/visitor present Nurse Communication: Mobility status PT Visit Diagnosis: Other abnormalities of gait and mobility (R26.89);Difficulty in walking, not elsewhere classified (R26.2)    Time: 8719-9412 PT Time Calculation (min) (ACUTE ONLY): 22 min   Charges:   PT Evaluation $PT Eval Low Complexity: 1 Low PT Treatments $Therapeutic Activity: 8-22 mins        Lieutenant Diego PT, DPT 2:32 PM,02/03/22

## 2022-02-03 NOTE — Care Management CC44 (Signed)
Condition Code 44 Documentation Completed  Patient Details  Name: Brandi [name] MRN: [id number] Date of Birth: 03-17-1936   Condition Code 44 given:  Yes Patient signature on Condition Code 44 notice:  Yes Documentation of 2 MD's agreement:  Yes Code 44 added to claim:  Yes    Shelbie Hutching, RN 02/03/2022, 3:55 PM

## 2022-02-03 NOTE — ED Notes (Signed)
Informed RN bed assigned 

## 2022-02-03 NOTE — ED Notes (Signed)
Transported to MRI

## 2022-02-04 DIAGNOSIS — I161 Hypertensive emergency: Secondary | ICD-10-CM

## 2022-02-04 DIAGNOSIS — G4733 Obstructive sleep apnea (adult) (pediatric): Secondary | ICD-10-CM | POA: Diagnosis present

## 2022-02-04 DIAGNOSIS — G9349 Other encephalopathy: Secondary | ICD-10-CM | POA: Diagnosis present

## 2022-02-04 DIAGNOSIS — B9620 Unspecified Escherichia coli [E. coli] as the cause of diseases classified elsewhere: Secondary | ICD-10-CM | POA: Diagnosis present

## 2022-02-04 DIAGNOSIS — Z888 Allergy status to other drugs, medicaments and biological substances status: Secondary | ICD-10-CM | POA: Diagnosis not present

## 2022-02-04 DIAGNOSIS — R400 Somnolence: Secondary | ICD-10-CM

## 2022-02-04 DIAGNOSIS — Z79899 Other long term (current) drug therapy: Secondary | ICD-10-CM | POA: Diagnosis not present

## 2022-02-04 DIAGNOSIS — R55 Syncope and collapse: Secondary | ICD-10-CM | POA: Diagnosis present

## 2022-02-04 DIAGNOSIS — I480 Paroxysmal atrial fibrillation: Secondary | ICD-10-CM | POA: Diagnosis present

## 2022-02-04 DIAGNOSIS — Z8711 Personal history of peptic ulcer disease: Secondary | ICD-10-CM | POA: Diagnosis not present

## 2022-02-04 DIAGNOSIS — F419 Anxiety disorder, unspecified: Secondary | ICD-10-CM | POA: Diagnosis present

## 2022-02-04 DIAGNOSIS — I2510 Atherosclerotic heart disease of native coronary artery without angina pectoris: Secondary | ICD-10-CM | POA: Diagnosis present

## 2022-02-04 DIAGNOSIS — E1142 Type 2 diabetes mellitus with diabetic polyneuropathy: Secondary | ICD-10-CM | POA: Diagnosis present

## 2022-02-04 DIAGNOSIS — K219 Gastro-esophageal reflux disease without esophagitis: Secondary | ICD-10-CM | POA: Diagnosis present

## 2022-02-04 DIAGNOSIS — D638 Anemia in other chronic diseases classified elsewhere: Secondary | ICD-10-CM | POA: Diagnosis present

## 2022-02-04 DIAGNOSIS — J449 Chronic obstructive pulmonary disease, unspecified: Secondary | ICD-10-CM | POA: Diagnosis present

## 2022-02-04 DIAGNOSIS — Z794 Long term (current) use of insulin: Secondary | ICD-10-CM | POA: Diagnosis not present

## 2022-02-04 DIAGNOSIS — E1165 Type 2 diabetes mellitus with hyperglycemia: Secondary | ICD-10-CM | POA: Diagnosis not present

## 2022-02-04 DIAGNOSIS — N179 Acute kidney failure, unspecified: Secondary | ICD-10-CM | POA: Diagnosis not present

## 2022-02-04 DIAGNOSIS — Z825 Family history of asthma and other chronic lower respiratory diseases: Secondary | ICD-10-CM | POA: Diagnosis not present

## 2022-02-04 DIAGNOSIS — I10 Essential (primary) hypertension: Secondary | ICD-10-CM | POA: Diagnosis present

## 2022-02-04 DIAGNOSIS — F32A Depression, unspecified: Secondary | ICD-10-CM | POA: Diagnosis present

## 2022-02-04 DIAGNOSIS — R4182 Altered mental status, unspecified: Secondary | ICD-10-CM | POA: Diagnosis present

## 2022-02-04 DIAGNOSIS — N390 Urinary tract infection, site not specified: Secondary | ICD-10-CM | POA: Diagnosis present

## 2022-02-04 DIAGNOSIS — E669 Obesity, unspecified: Secondary | ICD-10-CM | POA: Diagnosis present

## 2022-02-04 DIAGNOSIS — E785 Hyperlipidemia, unspecified: Secondary | ICD-10-CM | POA: Diagnosis present

## 2022-02-04 LAB — FOLATE: Folate: 26 ng/mL (ref >5.9)

## 2022-02-04 LAB — GLUCOSE, CAPILLARY
Glucose-Capillary: 228 mg/dL — ABNORMAL HIGH (ref 70–99)
Glucose-Capillary: 196 mg/dL — ABNORMAL HIGH (ref 70–99)
Glucose-Capillary: 158 mg/dL — ABNORMAL HIGH (ref 70–99)

## 2022-02-04 LAB — CBC WITH DIFFERENTIAL/PLATELET
Abs Immature Granulocytes: 0.04 10*3/uL (ref 0.00–0.07)
Basophils Absolute: 0 10*3/uL (ref 0.0–0.1)
Basophils Relative: 0 %
Eosinophils Absolute: 0 10*3/uL (ref 0.0–0.5)
Eosinophils Relative: 0 %
HCT: 45.6 % (ref 36.0–46.0)
Hemoglobin: 15 g/dL (ref 12.0–15.0)
Immature Granulocytes: 0 %
Lymphocytes Relative: 17 %
Lymphs Abs: 1.7 10*3/uL (ref 0.7–4.0)
MCH: 27.1 pg (ref 26.0–34.0)
MCHC: 32.9 g/dL (ref 30.0–36.0)
MCV: 82.5 fL (ref 80.0–100.0)
Monocytes Absolute: 0.4 10*3/uL (ref 0.1–1.0)
Monocytes Relative: 4 %
Neutro Abs: 7.6 10*3/uL (ref 1.7–7.7)
Neutrophils Relative %: 79 %
Platelets: 244 10*3/uL (ref 150–400)
RBC: 5.53 MIL/uL — ABNORMAL HIGH (ref 3.87–5.11)
RDW: 14.1 % (ref 11.5–15.5)
WBC: 9.8 10*3/uL (ref 4.0–10.5)
nRBC: 0 % (ref 0.0–0.2)

## 2022-02-04 LAB — IRON AND TIBC
Iron: 55 ug/dL (ref 28–170)
Saturation Ratios: 22 % (ref 10.4–31.8)
TIBC: 248 ug/dL — ABNORMAL LOW (ref 250–450)
UIBC: 193 ug/dL

## 2022-02-04 LAB — COMPREHENSIVE METABOLIC PANEL
ALT: 16 U/L (ref 0–44)
AST: 19 U/L (ref 15–41)
Albumin: 3.7 g/dL (ref 3.5–5.0)
Alkaline Phosphatase: 76 U/L (ref 38–126)
Anion gap: 12 (ref 5–15)
BUN: 12 mg/dL (ref 8–23)
CO2: 20 mmol/L — ABNORMAL LOW (ref 22–32)
Calcium: 9 mg/dL (ref 8.9–10.3)
Chloride: 103 mmol/L (ref 98–111)
Creatinine, Ser: 0.75 mg/dL (ref 0.44–1.00)
GFR, Estimated: >60 mL/min (ref >60)
Glucose, Bld: 220 mg/dL — ABNORMAL HIGH (ref 70–99)
Potassium: 3.4 mmol/L — ABNORMAL LOW (ref 3.5–5.1)
Sodium: 135 mmol/L (ref 135–145)
Total Bilirubin: 0.7 mg/dL (ref 0.3–1.2)
Total Protein: 7 g/dL (ref 6.5–8.1)

## 2022-02-04 LAB — RETICULOCYTES
Immature Retic Fract: 9.5 % (ref 2.3–15.9)
RBC.: 5.52 MIL/uL — ABNORMAL HIGH (ref 3.87–5.11)
Retic Count, Absolute: 65.7 10*3/uL (ref 19.0–186.0)
Retic Ct Pct: 1.2 % (ref 0.4–3.1)

## 2022-02-04 LAB — LIPID PANEL
Cholesterol: 155 mg/dL (ref 0–200)
HDL: 64 mg/dL (ref >40)
LDL Cholesterol: 73 mg/dL (ref 0–99)
Total CHOL/HDL Ratio: 2.4 RATIO
Triglycerides: 92 mg/dL (ref <150)
VLDL: 18 mg/dL (ref 0–40)

## 2022-02-04 LAB — VITAMIN B12: Vitamin B-12: 632 pg/mL (ref 180–914)

## 2022-02-04 LAB — MAGNESIUM: Magnesium: 1.7 mg/dL (ref 1.7–2.4)

## 2022-02-04 LAB — PHOSPHORUS: Phosphorus: 3.3 mg/dL (ref 2.5–4.6)

## 2022-02-04 LAB — T3, FREE: T3, Free: 3.3 pg/mL (ref 2.0–4.4)

## 2022-02-04 LAB — FERRITIN: Ferritin: 35 ng/mL (ref 11–307)

## 2022-02-04 LAB — CORTISOL-AM, BLOOD: Cortisol - AM: 39.2 ug/dL — ABNORMAL HIGH (ref 6.7–22.6)

## 2022-02-04 MED ORDER — SODIUM CHLORIDE 0.9 % IV SOLN
25.0000 mg | Freq: Once | INTRAVENOUS | Status: AC
  Administered 2022-02-04: 25 mg via INTRAVENOUS
  Filled 2022-02-04: qty 1

## 2022-02-04 MED ORDER — MAGNESIUM SULFATE 2 GM/50ML IV SOLN
2.0000 g | Freq: Once | INTRAVENOUS | Status: AC
  Administered 2022-02-04: 2 g via INTRAVENOUS
  Filled 2022-02-04: qty 50

## 2022-02-04 MED ORDER — HYDRALAZINE HCL 20 MG/ML IJ SOLN
10.0000 mg | INTRAMUSCULAR | Status: DC | PRN
Start: 2022-02-04 — End: 2022-02-06
  Administered 2022-02-04 – 2022-02-06 (×3): 10 mg via INTRAVENOUS
  Filled 2022-02-04 (×3): qty 1

## 2022-02-04 MED ORDER — METOPROLOL TARTRATE 5 MG/5ML IV SOLN
5.0000 mg | INTRAVENOUS | Status: AC | PRN
  Administered 2022-02-04 (×2): 5 mg via INTRAVENOUS
  Filled 2022-02-04 (×2): qty 5

## 2022-02-04 MED ORDER — NITROGLYCERIN IN D5W 200-5 MCG/ML-% IV SOLN
0.0000 ug/min | INTRAVENOUS | Status: DC
  Filled 2022-02-04: qty 250

## 2022-02-04 MED ORDER — LABETALOL HCL 5 MG/ML IV SOLN: 20.0000 mg | Freq: Once | INTRAVENOUS | Status: DC

## 2022-02-04 MED ORDER — NICARDIPINE HCL IN NACL 20-0.86 MG/200ML-% IV SOLN
3.0000 mg/h | INTRAVENOUS | Status: DC
  Filled 2022-02-04: qty 200

## 2022-02-04 MED ORDER — CARVEDILOL 12.5 MG PO TABS
12.5000 mg | ORAL_TABLET | Freq: Two times a day (BID) | ORAL | Status: DC
  Administered 2022-02-04 – 2022-02-05 (×2): 12.5 mg via ORAL
  Filled 2022-02-04 (×3): qty 1

## 2022-02-04 MED ORDER — POTASSIUM CHLORIDE 10 MEQ/100ML IV SOLN
10.0000 meq | INTRAVENOUS | Status: DC
  Administered 2022-02-04 (×4): 10 meq via INTRAVENOUS
  Filled 2022-02-04 (×5): qty 100

## 2022-02-04 MED ORDER — HYDRALAZINE HCL 20 MG/ML IJ SOLN
10.0000 mg | Freq: Once | INTRAMUSCULAR | Status: AC
Start: 2022-02-04 — End: 2022-02-04
  Administered 2022-02-04: 10 mg via INTRAVENOUS
  Filled 2022-02-04: qty 1

## 2022-02-04 MED ORDER — POTASSIUM CHLORIDE CRYS ER 20 MEQ PO TBCR
20.0000 meq | EXTENDED_RELEASE_TABLET | Freq: Once | ORAL | Status: AC
  Administered 2022-02-05: 20 meq via ORAL
  Filled 2022-02-04: qty 1

## 2022-02-04 NOTE — Progress Notes (Addendum)
Patient blood pressure was 240/120 and this was a manual blood pressure. Labetalol was ordered and given.

## 2022-02-04 NOTE — Progress Notes (Signed)
Hydralazine given due bp 210/100. Randol Kern NP notified.

## 2022-02-04 NOTE — Progress Notes (Signed)
PROGRESS NOTE    Brandi [name]  ZOX:[id number] DOB: 06/13/1936 DOA: 02/02/2022 PCP: Baxter Hire, MD     Brief Narrative:   Brandi [name] is a 86 y.o. WF PMHx Anemia ,anxiety , hx of vaso vagal syncope, recurrent UTI followed by Stoioff,depression,DMII, Paroxysmal -afib s/p LAA clipping, GERD, HA , HLD, HTN,Obesity , OSA on CPAP, CAD  s/p stent to LAD s/p CABG,PUD, Spinal stenosis, who    Presents to ED BIB EMS s/p recurrent syncopal episodes at home. Per EMS patient had 2 short lived syncopal episodes en route ,and vitals were noted to be stable throughout episodes. Per patient she has had multiple episodes of syncope in setting of illness such as UTI. Per daughter episodes over the last few years have become more frequent. Per patient she states before episode she had abdominal pain, she also endorses associated nausea with emesis after 1st episode of syncope. She denies any HA, focal weakness, fever/chills/ dysuria, frequency, hematuria, blood in stools or black stools.  She currently states she feels back to her baseline.      Subjective: 8/19 patient having intractable nausea and vomiting not able to keep down any p.o. medication.  Sleepy but arousable    Assessment & Plan: Covid vaccination;   Principal Problem:   Syncope Active Problems:   Obesity (BMI 30-39.9)  Recurrent Syncope -8/18 TSH= 3.6, free T3= 3.3, free T4= 0.81  - 8/18 a.m. cortisol pending - 8/18 echocardiogram normal see results below -S/p 09/2016 Dr. Lorne Skeens Cardiology placed [facility] monitor, followed by Dr Corky Sox, currently monitor nonfunctional (lifespan completed) -neuro exam non-focal  --to be complete f/u with Mri as CTH although no acute findings does note chronic ischemic changes.  -place on neuro checks , fall precaution -8/19 discussed case with Dr. Lorne Skeens Cardiology states that prior to discharge text him and he will have his RN come over and place a 14-day Holter  monitor.  If patient is discharged before Monday patient is to follow-up this week with him for monitor placement.    Altered mental status - 8/19 patient somnolent secondary to medication given for intractable nausea and vomiting   UTI -f/u on urine culture -CTX  -Complete 5-day course antibiotics   Hypertensive emergency -8/19 Coreg 12.5 mg BID (hold) -8/19 Hydralazine IV 10 mg PRN ( -Losartan 100 mg daily (hold) - Verapamil '300mg'$  (hold) -8/19 see intractable nausea and vomiting -8/19 NTG drip SBP goal 110-130  CAD s/p stent LAD s/p CABG -resume asa 81, statin    Paroxysmal atrial fibrillation -8/18 currently NSR  -s/P  LAA clipping  -see HTN -continue asa 81    OSA  -cpap qhs   COPD -resume home inhaler , symbicort   GERD/PUD -resume home regimen   Intractable nausea and vomiting - Secondary to uncontrolled BP - All p.o. BP medication on hold -8/19 secondary to hypertensive emergency unresponsive to Zofran.  Thorazine IV 25 mg x 1    HLD -statin  -8/18 lipid panel pending     DM type II uncontrolled with hyperglycemia -8/17 hemoglobin A1c= 7.3 -resume home insulin lantus 12 u -hold oral hypoglycemic while in house  CBG (last 3)  Recent Labs    02/04/22 0820 02/04/22 1223 02/04/22 1619  GLUCAP 228* 196* 158*      Anemia unspecified -8/18 anemia panel pending - Transfuse for hemoglobin<7 Lab Results  Component Value Date   HGB 15.0 02/04/2022   HGB 12.8 02/03/2022   HGB 12.6  02/03/2022   HGB 13.6 02/02/2022   HGB 13.6 07/24/2021    Anxiety  -resume home regimen     Obesity (BMI38.67 kg/m.)  -  Hypokalemia - Potassium goal> 4 - 8/19 Potassium IV 50 mEq  Hypomagnesmia - Magnesium goal> 2 - 8/19 Magnesium IV 2 g     Mobility Assessment (last 72 hours)     Mobility Assessment     Row Name 02/04/22 0807 02/03/22 2045 02/03/22 2030 02/03/22 1425     Does patient have an order for bedrest or is patient medically unstable No -  Continue assessment No - Continue assessment No - Continue assessment --    What is the highest level of mobility based on the progressive mobility assessment? Level 3 (Stands with assist) - Balance while standing  and cannot march in place Level 3 (Stands with assist) - Balance while standing  and cannot march in place Level 3 (Stands with assist) - Balance while standing  and cannot march in place Level 5 (Walks with assist in room/hall) - Balance while stepping forward/back and can walk in room with assist - Complete    Is the above level different from baseline mobility prior to current illness? Yes - Recommend PT order Yes - Recommend PT order Yes - Recommend PT order --               DVT prophylaxis: Subcu heparin Code Status: Full Family Communication: 8/19 husband at bedside for discussion of plan of care all questions answered Status is: Inpatient    Dispo: The patient is from: Home              Anticipated d/c is to: Home              Anticipated d/c date is: 2 days              Patient currently is not medically stable to d/c.      Consultants:  Phone consult Dr. Lorne Skeens Cardiology   Procedures/Significant Events:  8/18 Echocardiogram Left Ventricle: LVEF=55 to 60%.  - Left ventricular diastolic parameters were normal.  Right Ventricle: The right ventricular size is normal. No increase in  right ventricular wall thickness. Right ventricular systolic function is  normal.     I have personally reviewed and interpreted all radiology studies and my findings are as above.  VENTILATOR SETTINGS:    Cultures   Antimicrobials:    Devices    LINES / TUBES:      Continuous Infusions:  cefTRIAXone (ROCEPHIN)  IV Stopped (02/03/22 2134)     Objective: Vitals:   02/04/22 0539 02/04/22 0645 02/04/22 0646 02/04/22 0810  BP:  (!) 204/104 (!) 226/104 (!) 221/100  Pulse:   (!) 102 96  Resp:   16   Temp:   99 F (37.2 C)   TempSrc:       SpO2:   97% 96%  Weight: 86.1 kg     Height:        Intake/Output Summary (Last 24 hours) at 02/04/2022 0826 Last data filed at 02/04/2022 0500 Gross per 24 hour  Intake 100.09 ml  Output 1100 ml  Net -999.91 ml    Filed Weights   02/02/22 1212 02/04/22 0539  Weight: 89.8 kg 86.1 kg    Examination:  General: Somnolent but arousable, follows some commands  No acute respiratory distress Eyes: negative scleral hemorrhage, negative anisocoria, negative icterus ENT: Negative Runny nose, negative gingival bleeding, some difficulty with  hearing. Neck:  Negative scars, masses, torticollis, lymphadenopathy, JVD Lungs: Clear to auscultation bilaterally without wheezes or crackles Cardiovascular: Regular rate and rhythm without murmur gallop or rub normal S1 and S2 Abdomen: negative abdominal pain, nondistended, positive soft, bowel sounds, no rebound, no ascites, no appreciable mass, positive right CVA tenderness Extremities: No significant cyanosis, clubbing, or edema bilateral lower extremities Skin: Negative rashes, lesions, ulcers Psychiatric:  Negative depression, negative anxiety, negative fatigue, negative mania  Central nervous system:  Cranial nerves II through XII intact, tongue/uvula midline, all extremities muscle strength 5/5, sensation intact throughout, negative dysarthria, negative expressive aphasia, negative receptive aphasia.  .     Data Reviewed: Care during the described time interval was provided by me .  I have reviewed this patient's available data, including medical history, events of note, physical examination, and all test results as part of my evaluation.  CBC: Recent Labs  Lab 02/02/22 1217 02/03/22 0106 02/03/22 0928 02/04/22 0630  WBC 7.6 5.8 4.8 9.8  NEUTROABS  --   --  2.3 7.6  HGB 13.6 12.6 12.8 15.0  HCT 44.3 41.3 40.9 45.6  MCV 87.2 86.6 84.9 82.5  PLT 207 199 206 099    Basic Metabolic Panel: Recent Labs  Lab 02/02/22 1217  02/03/22 0106 02/03/22 0928  NA 138 138  --   K 3.8 3.6  --   CL 105 105  --   CO2 24 26  --   GLUCOSE 155* 175*  --   BUN 12 10  --   CREATININE 0.93 0.93  --   CALCIUM 9.0 8.5*  --   MG  --   --  1.8  PHOS  --   --  3.9    GFR: Estimated Creatinine Clearance: 44.6 mL/min (by C-G formula based on SCr of 0.93 mg/dL). Liver Function Tests: Recent Labs  Lab 02/03/22 0106  AST 18  ALT 16  ALKPHOS 63  BILITOT 0.5  PROT 5.9*  ALBUMIN 3.1*    No results for input(s): "LIPASE", "AMYLASE" in the last 168 hours. No results for input(s): "AMMONIA" in the last 168 hours. Coagulation Profile: No results for input(s): "INR", "PROTIME" in the last 168 hours. Cardiac Enzymes: No results for input(s): "CKTOTAL", "CKMB", "CKMBINDEX", "TROPONINI" in the last 168 hours. BNP (last 3 results) No results for input(s): "PROBNP" in the last 8760 hours. HbA1C: Recent Labs    02/02/22 1217  HGBA1C 7.3*    CBG: Recent Labs  Lab 02/03/22 0806 02/03/22 1259 02/03/22 1615 02/04/22 0820  GLUCAP 72 278* 105* 228*    Lipid Profile: No results for input(s): "CHOL", "HDL", "LDLCALC", "TRIG", "CHOLHDL", "LDLDIRECT" in the last 72 hours. Thyroid Function Tests: Recent Labs    02/03/22 1042  TSH 3.657  FREET4 0.81    Anemia Panel: Recent Labs    02/04/22 0630  RETICCTPCT 1.2   Sepsis Labs: No results for input(s): "PROCALCITON", "LATICACIDVEN" in the last 168 hours.  Recent Results (from the past 240 hour(s))  Culture, blood (Routine X 2) w Reflex to ID Panel     Status: None (Preliminary result)   Collection Time: 02/03/22  3:17 PM   Specimen: BLOOD LEFT HAND  Result Value Ref Range Status   Specimen Description BLOOD LEFT HAND  Final   Special Requests   Final    BOTTLES DRAWN AEROBIC AND ANAEROBIC Blood Culture adequate volume   Culture   Final    NO GROWTH < 24 HOURS Performed at [facility], [address] [place], [place] [address]    Report Status  PENDING  Incomplete  Culture, blood (Routine X 2) w Reflex to ID Panel     Status: None (Preliminary result)   Collection Time: 02/03/22  3:17 PM   Specimen: BLOOD RIGHT HAND  Result Value Ref Range Status   Specimen Description BLOOD RIGHT HAND  Final   Special Requests   Final    BOTTLES DRAWN AEROBIC AND ANAEROBIC Blood Culture adequate volume   Culture   Final    NO GROWTH < 24 HOURS Performed at [facility] [address], [place], [place] [address]    Report Status PENDING  Incomplete         Radiology Studies: ECHOCARDIOGRAM COMPLETE  Result Date: 02/03/2022    ECHOCARDIOGRAM REPORT   Patient Name:   Brandi [name] Date of Exam: 02/03/2022 Medical Rec #:  [id number]     Height:       60.0 in Accession #:    [id number]    Weight:       198.0 lb Date of Birth:  1936-03-10     BSA:          1.859 m Patient Age:    40 years      BP:           168/75 mmHg Patient Gender: F             HR:           81 bpm. Exam Location:  [facility] Procedure: 2D Echo, Color Doppler and Cardiac Doppler Indications:     R55 Syncope  History:         Patient has no prior history of Echocardiogram examinations.                  Prior CABG; Risk Factors:Hypertension, Diabetes, Dyslipidemia                  and Sleep Apnea.  Sonographer:     Charmayne Sheer Referring Phys:  [id number] SARA-MAIZ A THOMAS Diagnosing Phys: Isaias Cowman MD  Sonographer Comments: No subcostal window. IMPRESSIONS  1. Left ventricular ejection fraction, by estimation, is 55 to 60%. The left ventricle has normal function. The left ventricle has no regional wall motion abnormalities. There is mild left ventricular hypertrophy. Left ventricular diastolic parameters were normal.  2. Right ventricular systolic function is normal. The right ventricular size is normal.  3. The mitral valve is normal in structure. Mild mitral valve regurgitation. No evidence of mitral stenosis.  4. Tricuspid valve regurgitation is mild to moderate.  5. The  aortic valve is normal in structure. Aortic valve regurgitation is not visualized. No aortic stenosis is present.  6. The inferior vena cava is normal in size with greater than 50% respiratory variability, suggesting right atrial pressure of 3 mmHg. FINDINGS  Left Ventricle: Left ventricular ejection fraction, by estimation, is 55 to 60%. The left ventricle has normal function. The left ventricle has no regional wall motion abnormalities. The left ventricular internal cavity size was normal in size. There is  mild left ventricular hypertrophy. Left ventricular diastolic parameters were normal. Right Ventricle: The right ventricular size is normal. No increase in right ventricular wall thickness. Right ventricular systolic function is normal. Left Atrium: Left atrial size was normal in size. Right Atrium: Right atrial size was normal in size. Pericardium: There is no evidence of pericardial effusion. Mitral Valve: The mitral valve is normal in structure. Mild  mitral valve regurgitation. No evidence of mitral valve stenosis. Tricuspid Valve: The tricuspid valve is normal in structure. Tricuspid valve regurgitation is mild to moderate. No evidence of tricuspid stenosis. Aortic Valve: The aortic valve is normal in structure. Aortic valve regurgitation is not visualized. No aortic stenosis is present. Aortic valve mean gradient measures 8.0 mmHg. Aortic valve peak gradient measures 12.7 mmHg. Aortic valve area, by VTI measures 0.98 cm. Pulmonic Valve: The pulmonic valve was normal in structure. Pulmonic valve regurgitation is not visualized. No evidence of pulmonic stenosis. Aorta: The aortic root is normal in size and structure. Venous: The inferior vena cava is normal in size with greater than 50% respiratory variability, suggesting right atrial pressure of 3 mmHg. IAS/Shunts: No atrial level shunt detected by color flow Doppler.  LEFT VENTRICLE PLAX 2D LVIDd:         4.08 cm   Diastology LVIDs:         2.91 cm   LV  e' medial:    5.98 cm/s LV PW:         1.58 cm   LV E/e' medial:  13.1 LV IVS:        0.89 cm   LV e' lateral:   6.96 cm/s LVOT diam:     1.80 cm   LV E/e' lateral: 11.3 LV SV:         40 LV SV Index:   21 LVOT Area:     2.54 cm  RIGHT VENTRICLE RV Basal diam:  2.46 cm LEFT ATRIUM           Index        RIGHT ATRIUM          Index LA diam:      3.30 cm 1.78 cm/m   RA Area:     8.53 cm LA Vol (A2C): 23.6 ml 12.70 ml/m  RA Volume:   14.30 ml 7.69 ml/m LA Vol (A4C): 51.9 ml 27.92 ml/m  AORTIC VALVE                     PULMONIC VALVE AV Area (Vmax):    1.09 cm      PV Vmax:       0.87 m/s AV Area (Vmean):   0.97 cm      PV Peak grad:  3.0 mmHg AV Area (VTI):     0.98 cm AV Vmax:           178.00 cm/s AV Vmean:          137.000 cm/s AV VTI:            0.404 m AV Peak Grad:      12.7 mmHg AV Mean Grad:      8.0 mmHg LVOT Vmax:         76.40 cm/s LVOT Vmean:        52.400 cm/s LVOT VTI:          0.156 m LVOT/AV VTI ratio: 0.39  AORTA Ao Root diam: 2.70 cm MITRAL VALVE                TRICUSPID VALVE MV Area (PHT): 4.46 cm     TR Peak grad:   23.0 mmHg MV Decel Time: 170 msec     TR Vmax:        240.00 cm/s MV E velocity: 78.40 cm/s MV A velocity: 120.00 cm/s  SHUNTS MV E/A ratio:  0.65  Systemic VTI:  0.16 m                             Systemic Diam: 1.80 cm Isaias Cowman MD Electronically signed by Isaias Cowman MD Signature Date/Time: 02/03/2022/1:35:24 PM    Final    MR BRAIN WO CONTRAST  Result Date: 02/03/2022 CLINICAL DATA:  Syncope EXAM: MRI HEAD WITHOUT CONTRAST TECHNIQUE: Multiplanar, multiecho pulse sequences of the brain and surrounding structures were obtained without intravenous contrast. COMPARISON:  None Available. FINDINGS: Brain: No acute infarct, mass effect or extra-axial collection. No acute or chronic hemorrhage. There is multifocal hyperintense T2-weighted signal within the white matter. Parenchymal volume and CSF spaces are normal. A partially empty sella is  incidentally noted. Vascular: Major flow voids are preserved. Skull and upper cervical spine: Normal calvarium and skull base. Visualized upper cervical spine and soft tissues are normal. Sinuses/Orbits:No paranasal sinus fluid levels or advanced mucosal thickening. No mastoid or middle ear effusion. Normal orbits. IMPRESSION: 1. No acute intracranial abnormality. 2. Findings of chronic small vessel ischemia. Electronically Signed   By: Ulyses Jarred M.D.   On: 02/03/2022 01:11   X-ray chest PA and lateral  Result Date: 02/02/2022 CLINICAL DATA:  Syncopal episode. EXAM: CHEST - 2 VIEW COMPARISON:  July 24, 2021 FINDINGS: Multiple sternal wires and vascular clips are noted. The cardiac silhouette is mildly enlarged. A radiopaque loop recorder device is seen. There is marked severity calcification of the aortic arch. Both lungs are clear. The visualized skeletal structures are unremarkable. IMPRESSION: 1. Stable cardiomegaly and evidence of prior median sternotomy/CABG. 2. No acute cardiopulmonary disease. Electronically Signed   By: Virgina Norfolk M.D.   On: 02/02/2022 23:30   CT Head Wo Contrast  Result Date: 02/02/2022 CLINICAL DATA:  Recent syncopal episode EXAM: CT HEAD WITHOUT CONTRAST TECHNIQUE: Contiguous axial images were obtained from the base of the skull through the vertex without intravenous contrast. RADIATION DOSE REDUCTION: This exam was performed according to the departmental dose-optimization program which includes automated exposure control, adjustment of the mA and/or kV according to patient size and/or use of iterative reconstruction technique. COMPARISON:  07/24/2021 FINDINGS: Brain: No evidence of acute infarction, hemorrhage, hydrocephalus, extra-axial collection or mass lesion/mass effect. Chronic white matter ischemic changes are noted. Vascular: No hyperdense vessel or unexpected calcification. Skull: Normal. Negative for fracture or focal lesion. Sinuses/Orbits: No acute  finding. Other: None. IMPRESSION: Chronic ischemic changes are noted.  No acute abnormality noted. Electronically Signed   By: Inez Catalina M.D.   On: 02/02/2022 20:12        Scheduled Meds:  aspirin EC  81 mg Oral Daily   atorvastatin  40 mg Oral QPM   fluticasone furoate-vilanterol  1 puff Inhalation Daily   heparin  5,000 Units Subcutaneous Q8H   insulin aspart  0-9 Units Subcutaneous TID WC   insulin glargine-yfgn  12 Units Subcutaneous Daily   losartan  100 mg Oral Daily   pantoprazole  20 mg Oral BID   sodium chloride flush  3 mL Intravenous Q12H   verapamil  180 mg Oral Daily   And   verapamil  120 mg Oral Daily   Continuous Infusions:  cefTRIAXone (ROCEPHIN)  IV Stopped (02/03/22 2134)     LOS: 1 day    Time spent:40 min    Kasir Hallenbeck, Geraldo Docker, MD [facility]   If 7PM-7AM, please contact night-coverage 02/04/2022, 8:26 AM

## 2022-02-04 NOTE — Progress Notes (Signed)
   02/04/22 0838  Assess: MEWS Score  BP (!) 214/96  MAP (mmHg) 127  Assess: MEWS Score  MEWS Temp 0  MEWS Systolic 2  MEWS Pulse 0  MEWS RR 0  MEWS LOC 0  MEWS Score 2  MEWS Score Color Yellow  Assess: if the MEWS score is Yellow or Red  Were vital signs taken at a resting state? Yes  Focused Assessment No change from prior assessment  Does the patient meet 2 or more of the SIRS criteria? No  Does the patient have a confirmed or suspected source of infection? No  MEWS guidelines implemented *See Row Information* Yes  Treat  MEWS Interventions Administered prn meds/treatments;Escalated (See documentation below)  Pain Scale 0-10  Pain Score 0  Take Vital Signs  Increase Vital Sign Frequency  Yellow: Q 2hr X 2 then Q 4hr X 2, if remains yellow, continue Q 4hrs  Escalate  MEWS: Escalate Yellow: discuss with charge nurse/RN and consider discussing with provider and RRT  Notify: Charge Nurse/RN  Name of Charge Nurse/RN Notified Lashauna, RN  Date Charge Nurse/RN Notified 02/04/22  Time Charge Nurse/RN Notified [id number]  Notify: Provider  Provider Name/Title Dia Crawford  Date Provider Notified 02/04/22  Time Provider Notified [phone]  Method of Notification  (secure chat)  Notification Reason Other (Comment) (BP)  Provider response No new orders

## 2022-02-04 NOTE — Progress Notes (Signed)
A consult was placed to the IV Therapist for more iv access; pt on multiple meds, needing 5 runs of K;  able to place another 22ga; pt with VERY POOR peripheral access;  suggest central access for her if she requires continued iv medications.

## 2022-02-05 DIAGNOSIS — N390 Urinary tract infection, site not specified: Secondary | ICD-10-CM | POA: Diagnosis not present

## 2022-02-05 DIAGNOSIS — R400 Somnolence: Secondary | ICD-10-CM | POA: Diagnosis not present

## 2022-02-05 DIAGNOSIS — R55 Syncope and collapse: Secondary | ICD-10-CM | POA: Diagnosis not present

## 2022-02-05 DIAGNOSIS — E669 Obesity, unspecified: Secondary | ICD-10-CM | POA: Diagnosis not present

## 2022-02-05 DIAGNOSIS — D638 Anemia in other chronic diseases classified elsewhere: Secondary | ICD-10-CM | POA: Diagnosis present

## 2022-02-05 LAB — CBC WITH DIFFERENTIAL/PLATELET
Abs Immature Granulocytes: 0.04 10*3/uL (ref 0.00–0.07)
Basophils Absolute: 0 10*3/uL (ref 0.0–0.1)
Basophils Relative: 0 %
Eosinophils Absolute: 0 10*3/uL (ref 0.0–0.5)
Eosinophils Relative: 0 %
HCT: 41.6 % (ref 36.0–46.0)
Hemoglobin: 13.4 g/dL (ref 12.0–15.0)
Immature Granulocytes: 1 %
Lymphocytes Relative: 28 %
Lymphs Abs: 2.4 10*3/uL (ref 0.7–4.0)
MCH: 26.9 pg (ref 26.0–34.0)
MCHC: 32.2 g/dL (ref 30.0–36.0)
MCV: 83.4 fL (ref 80.0–100.0)
Monocytes Absolute: 0.7 10*3/uL (ref 0.1–1.0)
Monocytes Relative: 8 %
Neutro Abs: 5.4 10*3/uL (ref 1.7–7.7)
Neutrophils Relative %: 63 %
Platelets: 239 10*3/uL (ref 150–400)
RBC: 4.99 MIL/uL (ref 3.87–5.11)
RDW: 14.6 % (ref 11.5–15.5)
WBC: 8.5 10*3/uL (ref 4.0–10.5)
nRBC: 0 % (ref 0.0–0.2)

## 2022-02-05 LAB — COMPREHENSIVE METABOLIC PANEL
ALT: 12 U/L (ref 0–44)
AST: 13 U/L — ABNORMAL LOW (ref 15–41)
Albumin: 3.2 g/dL — ABNORMAL LOW (ref 3.5–5.0)
Alkaline Phosphatase: 63 U/L (ref 38–126)
Anion gap: 10 (ref 5–15)
BUN: 24 mg/dL — ABNORMAL HIGH (ref 8–23)
CO2: 22 mmol/L (ref 22–32)
Calcium: 8.7 mg/dL — ABNORMAL LOW (ref 8.9–10.3)
Chloride: 102 mmol/L (ref 98–111)
Creatinine, Ser: 1.51 mg/dL — ABNORMAL HIGH (ref 0.44–1.00)
GFR, Estimated: 34 mL/min — ABNORMAL LOW (ref >60)
Glucose, Bld: 130 mg/dL — ABNORMAL HIGH (ref 70–99)
Potassium: 4 mmol/L (ref 3.5–5.1)
Sodium: 134 mmol/L — ABNORMAL LOW (ref 135–145)
Total Bilirubin: 0.7 mg/dL (ref 0.3–1.2)
Total Protein: 6.2 g/dL — ABNORMAL LOW (ref 6.5–8.1)

## 2022-02-05 LAB — GLUCOSE, CAPILLARY
Glucose-Capillary: 121 mg/dL — ABNORMAL HIGH (ref 70–99)
Glucose-Capillary: 130 mg/dL — ABNORMAL HIGH (ref 70–99)
Glucose-Capillary: 268 mg/dL — ABNORMAL HIGH (ref 70–99)
Glucose-Capillary: 162 mg/dL — ABNORMAL HIGH (ref 70–99)
Glucose-Capillary: 160 mg/dL — ABNORMAL HIGH (ref 70–99)

## 2022-02-05 LAB — PHOSPHORUS: Phosphorus: 4.5 mg/dL (ref 2.5–4.6)

## 2022-02-05 LAB — MAGNESIUM: Magnesium: 2.7 mg/dL — ABNORMAL HIGH (ref 1.7–2.4)

## 2022-02-05 MED ORDER — HYDRALAZINE HCL 25 MG PO TABS
25.0000 mg | ORAL_TABLET | Freq: Three times a day (TID) | ORAL | Status: DC
  Administered 2022-02-05 – 2022-02-06 (×2): 25 mg via ORAL
  Filled 2022-02-05 (×3): qty 1

## 2022-02-05 MED ORDER — ATORVASTATIN CALCIUM 20 MG PO TABS: 60.0000 mg | ORAL_TABLET | Freq: Every evening | ORAL | Status: DC

## 2022-02-05 MED ORDER — VERAPAMIL HCL ER 180 MG PO TBCR
180.0000 mg | EXTENDED_RELEASE_TABLET | Freq: Every day | ORAL | Status: DC
  Administered 2022-02-05 – 2022-02-06 (×2): 180 mg via ORAL
  Filled 2022-02-05 (×2): qty 1

## 2022-02-05 MED ORDER — CARVEDILOL 25 MG PO TABS
25.0000 mg | ORAL_TABLET | Freq: Two times a day (BID) | ORAL | Status: DC
  Administered 2022-02-05 – 2022-02-06 (×2): 25 mg via ORAL
  Filled 2022-02-05 (×2): qty 1

## 2022-02-05 NOTE — Progress Notes (Signed)
PROGRESS NOTE    Brandi [name]  SNK:[id number] DOB: Nov 14, 1935 DOA: 02/02/2022 PCP: Baxter Hire, MD     Brief Narrative:   Brandi [name] is a 86 y.o. WF PMHx Anemia ,anxiety , hx of vaso vagal syncope, recurrent UTI followed by Sto[place]ff,depression,DMII, Paroxysmal -afib s/p LAA clipping, GERD, HA , HLD, HTN,Obesity , OSA on CPAP, CAD  s/p stent to LAD s/p CABG,PUD, Spinal stenosis, who    Presents to ED BIB EMS s/p recurrent syncopal episodes at home. Per EMS patient had 2 short lived syncopal episodes en route ,and vitals were noted to be stable throughout episodes. Per patient she has had multiple episodes of syncope in setting of illness such as UTI. Per daughter episodes over the last few years have become more frequent. Per patient she states before episode she had abdominal pain, she also endorses associated nausea with emesis after 1st episode of syncope. She denies any HA, focal weakness, fever/chills/ dysuria, frequency, hematuria, blood in stools or black stools.  She currently states she feels back to her baseline.      Subjective: 8/20 afebrile overnight.  Intractable nausea and vomiting resolved.  Patient feels much better.   Assessment & Plan: Covid vaccination;   Principal Problem:   Syncope Active Problems:   Obesity (BMI 30-39.9)   Altered mental status, unspecified   Anemia of chronic disease  Recurrent Syncope -8/18 TSH= 3.6, free T3= 3.3, free T4= 0.81  - 8/18 a.m. cortisol pending - 8/18 echocardiogram normal see results below -S/p 09/2016 Dr. Lorne Skeens Cardiology placed [facility] monitor, followed by Dr Corky Sox, currently monitor nonfunctional (lifespan completed) -neuro exam non-focal  --to be complete f/u with Mri as CTH although no acute findings does note chronic ischemic changes.  -place on neuro checks , fall precaution -8/19 discussed case with Dr. Lorne Skeens Cardiology states that prior to discharge text him and he will have  his RN come over and place a 14-day Holter monitor.  If patient is discharged before Monday patient is to follow-up this week with him for monitor placement.  -8/20 prior to discharge in AM  text Dr. Lorne Skeens Cardiology states that prior to discharge text him and he will have his RN come over and place a 14-day Holter monitor  Altered mental status - 8/19 patient somnolent secondary to medication given for intractable nausea and vomiting -8/20 resolved   UTI -f/u on urine culture -CTX  -Complete 5-day course antibiotics  AKI Lab Results  Component Value Date   CREATININE 1.51 (H) 02/05/2022   CREATININE 0.75 02/04/2022   CREATININE 0.93 02/03/2022   CREATININE 0.93 02/02/2022   CREATININE 0.83 07/24/2021     Hypertensive emergency -8/19 Coreg 12.5 mg BID  -8/19 Hydralazine IV 10 mg PRN  -Losartan 100 mg daily (hold) see AKI -8/19 see intractable nausea and vomiting -8/19 NTG drip SBP goal 110-130, discontinued -8/20 increase Coreg 25 mg BID - 8/20 Verapamil CR 180 mg daily: (Verapamil CR '300mg'$  daily home dose)   CAD s/p stent LAD s/p CABG -resume asa 81, statin    Paroxysmal atrial fibrillation -8/18 currently NSR  -s/P  LAA clipping  -see HTN -continue asa 81    OSA  -cpap qhs   COPD -Albuterol nebulizer PRN -Breo Ellipta 100-25 mcg -   GERD/PUD -resume home regimen   Intractable nausea and vomiting - Secondary to uncontrolled BP - All p.o. BP medication on hold -8/19 secondary to hypertensive emergency unresponsive to Zofran.  Thorazine IV 25 mg x 1 -8/20 resolved   HLD -8/19 LDL = 73; goal<70 -8/20 increase Atorvastatin 60 mg daily     DM type II uncontrolled with hyperglycemia -8/17 hemoglobin A1c= 7.3 -resume home insulin lantus 12 u -hold oral hypoglycemic while in house  CBG (last 3)  Recent Labs    02/05/22 0813 02/05/22 1133 02/05/22 1634  GLUCAP 130* 268* 162*      Anemia chronic disease -8/18 anemia panel  consistent with chronic disease - Transfuse for hemoglobin<7 Lab Results  Component Value Date   HGB 13.4 02/05/2022   HGB 15.0 02/04/2022   HGB 12.8 02/03/2022   HGB 12.6 02/03/2022   HGB 13.6 02/02/2022    Anxiety  -Not on home medication.   Obesity (BMI38.67 kg/m.)  -  Hypokalemia - Potassium goal> 4 - 8/19 Potassium IV 50 mEq  Hypomagnesmia - Magnesium goal> 2 - 8/19 Magnesium IV 2 g  Goals of care - 8/20 ambulate patient in hallway if BP stable will discharge in a.m. - 8/20 consult PT/OT patient hypertensive emergency and intractable nausea and vomiting now improved evaluate for CIR vs SNF vs H/H.     Mobility Assessment (last 72 hours)     Mobility Assessment     Row Name 02/05/22 [phone] 02/04/22 2027 02/04/22 0807 02/03/22 2045 02/03/22 2030   Does patient have an order for bedrest or is patient medically unstable No - Continue assessment No - Continue assessment No - Continue assessment No - Continue assessment No - Continue assessment   What is the highest level of mobility based on the progressive mobility assessment? Level 3 (Stands with assist) - Balance while standing  and cannot march in place Level 3 (Stands with assist) - Balance while standing  and cannot march in place Level 3 (Stands with assist) - Balance while standing  and cannot march in place Level 3 (Stands with assist) - Balance while standing  and cannot march in place Level 3 (Stands with assist) - Balance while standing  and cannot march in place   Is the above level different from baseline mobility prior to current illness? Yes - Recommend PT order Yes - Recommend PT order Yes - Recommend PT order Yes - Recommend PT order Yes - Recommend PT order    [place] Name 02/03/22 1425           What is the highest level of mobility based on the progressive mobility assessment? Level 5 (Walks with assist in room/hall) - Balance while stepping forward/back and can walk in room with assist - Complete                   DVT prophylaxis: Subcu heparin Code Status: Full Family Communication: 8/20 husband at bedside for discussion of plan of care all questions answered Status is: Inpatient    Dispo: The patient is from: Home              Anticipated d/c is to: Home              Anticipated d/c date is: 1 day              Patient currently stable      Consultants:  Phone consult Dr. Lorne Skeens Cardiology   Procedures/Significant Events:  8/18 Echocardiogram Left Ventricle: LVEF=55 to 60%.  - Left ventricular diastolic parameters were normal.  Right Ventricle: The right ventricular size is normal. No increase in  right ventricular wall thickness. Right  ventricular systolic function is  normal.     I have personally reviewed and interpreted all radiology studies and my findings are as above.  VENTILATOR SETTINGS:    Cultures   Antimicrobials: Anti-infectives (From admission, onward)    Start     Ordered Stop   02/03/22 2000  cefTRIAXone (ROCEPHIN) 1 g in sodium chloride 0.9 % 100 mL IVPB        02/03/22 0731     02/02/22 2115  cefTRIAXone (ROCEPHIN) 1 g in sodium chloride 0.9 % 100 mL IVPB        02/02/22 2106 02/02/22 2240          Devices    LINES / TUBES:      Continuous Infusions:  cefTRIAXone (ROCEPHIN)  IV Stopped (02/04/22 2346)   nitroGLYCERIN       Objective: Vitals:   02/05/22 0814 02/05/22 1134 02/05/22 1633 02/05/22 1813  BP: (!) 168/70 133/80 (!) 192/86 (!) 149/73  Pulse: 75 84 86   Resp: 17 18    Temp:  97.9 F (36.6 C)    TempSrc:  Oral    SpO2: 96% 97% 95%   Weight:      Height:        Intake/Output Summary (Last 24 hours) at 02/05/2022 1912 Last data filed at 02/05/2022 0028 Gross per 24 hour  Intake 500.95 ml  Output --  Net 500.95 ml   Filed Weights   02/02/22 1212 02/04/22 0539 02/05/22 0400  Weight: 89.8 kg 86.1 kg 84.1 kg    Examination:  General: Somnolent but arousable, follows some commands   No acute respiratory distress Eyes: negative scleral hemorrhage, negative anisocoria, negative icterus ENT: Negative Runny nose, negative gingival bleeding, some difficulty with hearing. Neck:  Negative scars, masses, torticollis, lymphadenopathy, JVD Lungs: Clear to auscultation bilaterally without wheezes or crackles Cardiovascular: Regular rate and rhythm without murmur gallop or rub normal S1 and S2 Abdomen: negative abdominal pain, nondistended, positive soft, bowel sounds, no rebound, no ascites, no appreciable mass, Extremities: No significant cyanosis, clubbing, or edema bilateral lower extremities Skin: Negative rashes, lesions, ulcers Psychiatric:  Negative depression, negative anxiety, negative fatigue, negative mania  Central nervous system:  Cranial nerves II through XII intact, tongue/uvula midline, all extremities muscle strength 5/5, sensation intact throughout, negative dysarthria, negative expressive aphasia, negative receptive aphasia.  .     Data Reviewed: Care during the described time interval was provided by me .  I have reviewed this patient's available data, including medical history, events of note, physical examination, and all test results as part of my evaluation.  CBC: Recent Labs  Lab 02/02/22 1217 02/03/22 0106 02/03/22 0928 02/04/22 0630 02/05/22 0517  WBC 7.6 5.8 4.8 9.8 8.5  NEUTROABS  --   --  2.3 7.6 5.4  HGB 13.6 12.6 12.8 15.0 13.4  HCT 44.3 41.3 40.9 45.6 41.6  MCV 87.2 86.6 84.9 82.5 83.4  PLT 207 199 206 244 008   Basic Metabolic Panel: Recent Labs  Lab 02/02/22 1217 02/03/22 0106 02/03/22 0928 02/04/22 0630 02/05/22 0517  NA 138 138  --  135 134*  K 3.8 3.6  --  3.4* 4.0  CL 105 105  --  103 102  CO2 24 26  --  20* 22  GLUCOSE 155* 175*  --  220* 130*  BUN 12 10  --  12 24*  CREATININE 0.93 0.93  --  0.75 1.51*  CALCIUM 9.0 8.5*  --  9.0 8.7*  MG  --   --  1.8 1.7 2.7*  PHOS  --   --  3.9 3.3 4.5   GFR: Estimated  Creatinine Clearance: 27.1 mL/min (A) (by C-G formula based on SCr of 1.51 mg/dL (H)). Liver Function Tests: Recent Labs  Lab 02/03/22 0106 02/04/22 0630 02/05/22 0517  AST 18 19 13*  ALT '16 16 12  '$ ALKPHOS 63 76 63  BILITOT 0.5 0.7 0.7  PROT 5.9* 7.0 6.2*  ALBUMIN 3.1* 3.7 3.2*   No results for input(s): "LIPASE", "AMYLASE" in the last 168 hours. No results for input(s): "AMMONIA" in the last 168 hours. Coagulation Profile: No results for input(s): "INR", "PROTIME" in the last 168 hours. Cardiac Enzymes: No results for input(s): "CKTOTAL", "CKMB", "CKMBINDEX", "TROPONINI" in the last 168 hours. BNP (last 3 results) No results for input(s): "PROBNP" in the last 8760 hours. HbA1C: No results for input(s): "HGBA1C" in the last 72 hours. CBG: Recent Labs  Lab 02/04/22 1619 02/05/22 0426 02/05/22 0813 02/05/22 1133 02/05/22 1634  GLUCAP 158* 121* 130* 268* 162*   Lipid Profile: Recent Labs    02/04/22 0630  CHOL 155  HDL 64  LDLCALC 73  TRIG 92  CHOLHDL 2.4   Thyroid Function Tests: Recent Labs    02/03/22 1042  TSH 3.657  FREET4 0.81  T3FREE 3.3   Anemia Panel: Recent Labs    02/04/22 0630 02/04/22 1127  VITAMINB12 632  --   FOLATE  --  26.0  FERRITIN 35  --   TIBC 248*  --   IRON 55  --   RETICCTPCT 1.2  --    Sepsis Labs: No results for input(s): "PROCALCITON", "LATICACIDVEN" in the last 168 hours.  Recent Results (from the past 240 hour(s))  Urine Culture     Status: Abnormal (Preliminary result)   Collection Time: 02/02/22  8:27 AM   Specimen: Urine, Clean Catch  Result Value Ref Range Status   Specimen Description   Final    URINE, CLEAN CATCH Performed at [facility], [address] [place], [place] [address]    Special Requests   Final    NONE Performed at [facility], [address] [place], [place] [address]    Culture >=100,000 COLONIES/mL ESCHERICHIA COLI (A)  Final   Report Status PENDING  Incomplete   Culture, blood (Routine X 2) w Reflex to ID Panel     Status: None (Preliminary result)   Collection Time: 02/03/22  3:17 PM   Specimen: BLOOD LEFT HAND  Result Value Ref Range Status   Specimen Description BLOOD LEFT HAND  Final   Special Requests   Final    BOTTLES DRAWN AEROBIC AND ANAEROBIC Blood Culture adequate volume   Culture   Final    NO GROWTH 2 DAYS Performed at [facility], [address], [place], [place] [address]    Report Status PENDING  Incomplete  Culture, blood (Routine X 2) w Reflex to ID Panel     Status: None (Preliminary result)   Collection Time: 02/03/22  3:17 PM   Specimen: BLOOD RIGHT HAND  Result Value Ref Range Status   Specimen Description BLOOD RIGHT HAND  Final   Special Requests   Final    BOTTLES DRAWN AEROBIC AND ANAEROBIC Blood Culture adequate volume   Culture   Final    NO GROWTH 2 DAYS Performed at [facility], [address] [place], [place] [address]    Report Status PENDING  Incomplete  Radiology Studies: No results found.      Scheduled Meds:  aspirin EC  81 mg Oral Daily   [START ON 02/06/2022] atorvastatin  60 mg Oral QPM   carvedilol  25 mg Oral BID   fluticasone furoate-vilanterol  1 puff Inhalation Daily   heparin  5,000 Units Subcutaneous Q8H   hydrALAZINE  25 mg Oral Q8H   insulin aspart  0-9 Units Subcutaneous TID WC   insulin glargine-yfgn  12 Units Subcutaneous Daily   pantoprazole  20 mg Oral BID   sodium chloride flush  3 mL Intravenous Q12H   verapamil  180 mg Oral Daily   Continuous Infusions:  cefTRIAXone (ROCEPHIN)  IV Stopped (02/04/22 2346)   nitroGLYCERIN       LOS: 3 days    Time spent:40 min    Shyanne Mcclary, Geraldo Docker, MD [facility]   If 7PM-7AM, please contact night-coverage 02/05/2022, 7:12 PM

## 2022-02-05 NOTE — Progress Notes (Signed)
Pt stated she is not using CPAP. Pt aware that if she would like to use one, we can set her up on one.

## 2022-02-06 DIAGNOSIS — I10 Essential (primary) hypertension: Secondary | ICD-10-CM

## 2022-02-06 DIAGNOSIS — N390 Urinary tract infection, site not specified: Secondary | ICD-10-CM | POA: Diagnosis not present

## 2022-02-06 DIAGNOSIS — R55 Syncope and collapse: Secondary | ICD-10-CM | POA: Diagnosis not present

## 2022-02-06 LAB — COMPREHENSIVE METABOLIC PANEL
ALT: 15 U/L (ref 0–44)
AST: 21 U/L (ref 15–41)
Albumin: 3.3 g/dL — ABNORMAL LOW (ref 3.5–5.0)
Alkaline Phosphatase: 54 U/L (ref 38–126)
Anion gap: 8 (ref 5–15)
BUN: 28 mg/dL — ABNORMAL HIGH (ref 8–23)
CO2: 24 mmol/L (ref 22–32)
Calcium: 8.8 mg/dL — ABNORMAL LOW (ref 8.9–10.3)
Chloride: 103 mmol/L (ref 98–111)
Creatinine, Ser: 1.15 mg/dL — ABNORMAL HIGH (ref 0.44–1.00)
GFR, Estimated: 47 mL/min — ABNORMAL LOW (ref >60)
Glucose, Bld: 161 mg/dL — ABNORMAL HIGH (ref 70–99)
Potassium: 4.1 mmol/L (ref 3.5–5.1)
Sodium: 135 mmol/L (ref 135–145)
Total Bilirubin: 0.8 mg/dL (ref 0.3–1.2)
Total Protein: 6.2 g/dL — ABNORMAL LOW (ref 6.5–8.1)

## 2022-02-06 LAB — CBC WITH DIFFERENTIAL/PLATELET
Abs Immature Granulocytes: 0.02 10*3/uL (ref 0.00–0.07)
Basophils Absolute: 0 10*3/uL (ref 0.0–0.1)
Basophils Relative: 1 %
Eosinophils Absolute: 0.1 10*3/uL (ref 0.0–0.5)
Eosinophils Relative: 2 %
HCT: 41.2 % (ref 36.0–46.0)
Hemoglobin: 13.3 g/dL (ref 12.0–15.0)
Immature Granulocytes: 0 %
Lymphocytes Relative: 31 %
Lymphs Abs: 2.1 10*3/uL (ref 0.7–4.0)
MCH: 26.7 pg (ref 26.0–34.0)
MCHC: 32.3 g/dL (ref 30.0–36.0)
MCV: 82.6 fL (ref 80.0–100.0)
Monocytes Absolute: 0.7 10*3/uL (ref 0.1–1.0)
Monocytes Relative: 10 %
Neutro Abs: 3.8 10*3/uL (ref 1.7–7.7)
Neutrophils Relative %: 56 %
Platelets: 232 10*3/uL (ref 150–400)
RBC: 4.99 MIL/uL (ref 3.87–5.11)
RDW: 14.4 % (ref 11.5–15.5)
WBC: 6.7 10*3/uL (ref 4.0–10.5)
nRBC: 0 % (ref 0.0–0.2)

## 2022-02-06 LAB — GLUCOSE, CAPILLARY
Glucose-Capillary: 144 mg/dL — ABNORMAL HIGH (ref 70–99)
Glucose-Capillary: 170 mg/dL — ABNORMAL HIGH (ref 70–99)
Glucose-Capillary: 242 mg/dL — ABNORMAL HIGH (ref 70–99)

## 2022-02-06 LAB — MAGNESIUM: Magnesium: 2.2 mg/dL (ref 1.7–2.4)

## 2022-02-06 LAB — URINE CULTURE: Culture: >=100000 — AB

## 2022-02-06 LAB — PHOSPHORUS: Phosphorus: 3.8 mg/dL (ref 2.5–4.6)

## 2022-02-06 MED ORDER — FAMOTIDINE 20 MG PO TABS
20.0000 mg | ORAL_TABLET | Freq: Two times a day (BID) | ORAL | Status: DC
  Administered 2022-02-06: 20 mg via ORAL
  Filled 2022-02-06: qty 1

## 2022-02-06 MED ORDER — SENNOSIDES-DOCUSATE SODIUM 8.6-50 MG PO TABS: 1.0000 | ORAL_TABLET | Freq: Every evening | ORAL | Status: DC | PRN

## 2022-02-06 MED ORDER — VITAMIN D 25 MCG (1000 UNIT) PO TABS: 5000.0000 [IU] | ORAL_TABLET | Freq: Every day | ORAL | Status: DC

## 2022-02-06 MED ORDER — SALINE SPRAY 0.65 % NA SOLN: 1.0000 | Freq: Two times a day (BID) | NASAL | Status: DC | PRN

## 2022-02-06 MED ORDER — GLIPIZIDE 5 MG PO TABS
5.0000 mg | ORAL_TABLET | Freq: Every day | ORAL | Status: DC
  Filled 2022-02-06: qty 1

## 2022-02-06 MED ORDER — LINAGLIPTIN 5 MG PO TABS
5.0000 mg | ORAL_TABLET | Freq: Two times a day (BID) | ORAL | Status: DC
Start: 2022-02-06 — End: 2022-02-06
  Filled 2022-02-06: qty 1

## 2022-02-06 MED ORDER — GABAPENTIN 400 MG PO CAPS
400.0000 mg | ORAL_CAPSULE | Freq: Two times a day (BID) | ORAL | Status: DC
  Administered 2022-02-06: 400 mg via ORAL
  Filled 2022-02-06: qty 1

## 2022-02-06 MED ORDER — POLYVINYL ALCOHOL 1.4 % OP SOLN: 1.0000 [drp] | Freq: Every evening | OPHTHALMIC | Status: DC | PRN

## 2022-02-06 MED ORDER — MELATONIN 5 MG PO TABS: 10.0000 mg | ORAL_TABLET | Freq: Every day | ORAL | Status: DC

## 2022-02-06 MED ORDER — OCUVITE-LUTEIN PO CAPS
1.0000 | ORAL_CAPSULE | Freq: Two times a day (BID) | ORAL | Status: DC
  Filled 2022-02-06: qty 1

## 2022-02-06 MED ORDER — SITAGLIP PHOS-METFORMIN HCL ER 50-1000 MG PO TB24: 1.0000 | ORAL_TABLET | Freq: Two times a day (BID) | ORAL | Status: DC

## 2022-02-06 MED ORDER — LOSARTAN POTASSIUM 50 MG PO TABS: 100.0000 mg | ORAL_TABLET | Freq: Every day | ORAL | Status: DC

## 2022-02-06 MED ORDER — CEPHALEXIN 500 MG PO CAPS
500.0000 mg | ORAL_CAPSULE | Freq: Four times a day (QID) | ORAL | Status: DC
  Administered 2022-02-06: 500 mg via ORAL
  Filled 2022-02-06: qty 1

## 2022-02-06 MED ORDER — GLIPIZIDE 5 MG PO TABS: 5.0000 mg | ORAL_TABLET | Freq: Every day | ORAL | 2 refills | Status: DC

## 2022-02-06 MED ORDER — HYDRALAZINE HCL 50 MG PO TABS: 50.0000 mg | ORAL_TABLET | Freq: Three times a day (TID) | ORAL | Status: DC

## 2022-02-06 MED ORDER — CARVEDILOL 12.5 MG PO TABS: 12.5000 mg | ORAL_TABLET | Freq: Two times a day (BID) | ORAL | 0 refills | Status: DC

## 2022-02-06 MED ORDER — CEPHALEXIN 500 MG PO CAPS: 500.0000 mg | ORAL_CAPSULE | Freq: Four times a day (QID) | ORAL | 0 refills | Status: AC

## 2022-02-06 MED ORDER — CARVEDILOL 12.5 MG PO TABS: 12.5000 mg | ORAL_TABLET | Freq: Two times a day (BID) | ORAL | Status: DC

## 2022-02-06 MED ORDER — METFORMIN HCL ER 500 MG PO TB24
1000.0000 mg | ORAL_TABLET | Freq: Two times a day (BID) | ORAL | Status: DC
  Filled 2022-02-06: qty 2

## 2022-02-06 MED ORDER — MIRABEGRON ER 25 MG PO TB24
25.0000 mg | ORAL_TABLET | Freq: Every day | ORAL | Status: DC
  Filled 2022-02-06: qty 1

## 2022-02-06 NOTE — TOC Transition Note (Signed)
Transition of Care [facility]) - CM/SW Discharge Note   Patient Details  Name: Brandi [name] MRN: [id number] Date of Birth: [birth year]  [facility] CM/SW Contact:  Candie Chroman, LCSW Phone Number: 02/06/2022, 12:10 PM   Clinical Narrative:  Patient has orders to discharge home today. Left message for [facility] representative to notify. No further concerns. CSW signing off.   Final next level of care: [place] Barriers to Discharge: Barriers Resolved   Patient Goals and CMS Choice Patient states their goals for this hospitalization and ongoing recovery are:: to get back home CMS Medicare.gov Compare Post Acute Care list provided to:: Patient Choice offered to / list presented to : Patient  Discharge Placement                    Patient and family notified of of transfer: 02/06/22  Discharge Plan and Services   Discharge Planning Services: CM Consult Post Acute Care Choice: Home Health          DME Arranged: N/A DME Agency: NA       HH Arranged: PT [place] Agency: [facility] Date [place] Agency Contacted: 02/06/22 Time [place]: [id number] Representative spoke with at [place]: Juanda Crumble  Social Determinants of Health (SDOH) Interventions     Readmission Risk Interventions     No data to display

## 2022-02-06 NOTE — Discharge Summary (Addendum)
Physician Discharge Summary   Patient: Brandi [name] MRN: [id number] DOB: May 23, 1936  Admit date:     02/02/2022  Discharge date: 02/06/22  Discharge Physician: Fritzi Mandes   PCP: Baxter Hire, MD   Recommendations at discharge:   follow-up Dr. Nehemiah Massed in 1 to 2 weeks. Follow-up primary care physician in 1 to 2 weeks keep log of blood pressure at home. Hold Coreg (new med) if blood pressure less than 130  Discharge Diagnoses: recurrent syncope E. coli UTI  Hospital Course:   Brandi [name] is a 86 y.o. female with medical history significant of Anemia ,anxiety , hx of vaso vagal syncope, recurrent UTI followed by Stoioff,depression,DMII, Pafib s/p LAA clipping, GERD, HA , HLD, HTN,Obesity , OSA on CPAP, CAD  s/p stent to LAD s/p CABG,PUD, Spinal stenosis, who presents to ED BIB EMS s/p recurrent syncopal episodes at home. Per EMS patient had 2 short lived syncopal episodes en route ,and vitals were noted to be stable throughout episodes.   Syncope, recurrent etiology unclear -- MRI brain and CT head negative -- patient overall remains in sinus rhythm. No further syncopal episode. -- BP was on the higher side. Medications were adjusted. -- Blood pressure much improved -- case was discussed with Dr. Saralyn Pilar. 30 day event monitor over the place prior to discharge patient will follow-up with Dr. Nehemiah Massed as outpatient. -- She made in sinus rhythm  Altered mental status/acute encephalopathy resolved -- suspect due to UTI and elevated blood pressure  E. coli UTI -- completed antibiotic as outpatient  Hypertensive urgency -- blood pressure on the higher side during admission. But stabilize. Patient's home dose Cozaar was held due to elevated creatinine now resumed. -- Low-dose Coreg was added to blood pressure regimen. Holding parameter discussed with patient and son  Paroxysmal a fib -- on verapamil and Coreg -- continue aspirin -- Rob Dr. Nehemiah Massed  Type II diabetes  uncontrolled with hyperglycemia -- resume home meds including insulin  Hyperlipidemia on statins  Seen by physical therapy recommends PT. Will resume home health PT orders. Discussed discharge plan with patient and son in the room. Both in agreement.     Consultants: none Procedures performed: none  Disposition: Home health Diet recommendation:  Discharge Diet Orders (From admission, onward)     Start     Ordered   02/06/22 0000  Diet - low sodium heart healthy        02/06/22 1200           Cardiac and Carb modified diet DISCHARGE MEDICATION: Allergies as of 02/06/2022       Reactions   Accupril [quinapril Hcl] Rash   Micardis [telmisartan] Other (See Comments)   Dizziness   Quinapril Rash        Medication List     STOP taking these medications    albuterol 108 (90 Base) MCG/ACT inhaler Commonly known as: VENTOLIN HFA       TAKE these medications    acetaminophen 500 MG tablet Commonly known as: TYLENOL Take 1,000 mg by mouth 2 (two) times daily.   aspirin EC 81 MG tablet Take 81 mg by mouth daily.   atorvastatin 40 MG tablet Commonly known as: LIPITOR Take 40 mg by mouth every evening.   budesonide-formoterol 80-4.5 MCG/ACT inhaler Commonly known as: SYMBICORT Inhale 2 puffs into the lungs 2 (two) times daily.   carboxymethylcellulose 0.5 % Soln Commonly known as: REFRESH PLUS Apply 1-2 drops to eye at bedtime.   carvedilol 12.5  MG tablet Commonly known as: COREG Take 1 tablet (12.5 mg total) by mouth 2 (two) times daily. HOLD IF SBP <130 What changed: additional instructions   cephALEXin 500 MG capsule Commonly known as: KEFLEX Take 1 capsule (500 mg total) by mouth 4 (four) times daily for 3 days.   famotidine 20 MG tablet Commonly known as: PEPCID Take 20 mg by mouth 2 (two) times daily.   gabapentin 400 MG capsule Commonly known as: NEURONTIN Take 400 mg by mouth in the morning and at bedtime.   glipiZIDE 5 MG  tablet Commonly known as: GLUCOTROL Take 1 tablet (5 mg total) by mouth daily.   Janumet XR 50-1000 MG Tb24 Generic drug: SitaGLIPtin-MetFORMIN HCl Take 1 tablet by mouth 2 (two) times daily.   Lantus SoloStar 100 UNIT/ML Solostar Pen Generic drug: insulin glargine Inject 12 Units into the skin daily.   losartan 100 MG tablet Commonly known as: COZAAR Take 100 mg by mouth daily.   Melatonin 10 MG Tabs Take 1 tablet by mouth at bedtime.   Myrbetriq 25 MG Tb24 tablet Generic drug: mirabegron ER TAKE 1 TABLET(25 MG) BY MOUTH DAILY   PreserVision/Lutein Caps Take 1 capsule by mouth 2 (two) times daily.   senna-docusate 8.6-50 MG tablet Commonly known as: Senokot-S Take 1 tablet by mouth at bedtime as needed for mild constipation.   sodium chloride 0.65 % nasal spray Commonly known as: OCEAN Place 1 spray into the nose 2 (two) times daily.   Verapamil HCl CR 300 MG Cp24 Take 1 capsule by mouth daily.   VITAMIN B-12 PO Take 1 tablet by mouth daily.   Vitamin D-3 125 MCG (5000 UT) Tabs Take 5,000 mcg by mouth daily in the afternoon.        Follow-up Information     Corey Skains, MD. Schedule an appointment as soon as possible for a visit in 1 week(s).   Specialty: Cardiology Why: pt has h/o syncope and now has 14 day event monitro. will need f/u for that Contact information: [address] [facility] [place] [address] [phone]         Baxter Hire, MD. Go to.   Specialty: Internal Medicine Why: your appt in october or reschedule sooner if needed Contact information: [place] [address] [phone]                Discharge Exam: Danley Danker Weights   02/04/22 0539 02/05/22 0400 02/06/22 0526  Weight: 86.1 kg 84.1 kg 87.1 kg     Condition at discharge: fair  The results of significant diagnostics from this hospitalization (including imaging, microbiology, ancillary and  laboratory) are listed below for reference.   Imaging Studies: ECHOCARDIOGRAM COMPLETE  Result Date: 02/03/2022    ECHOCARDIOGRAM REPORT   Patient Name:   Brandi [name] Date of Exam: 02/03/2022 Medical Rec #:  [id number]     Height:       60.0 in Accession #:    [id number]    Weight:       198.0 lb Date of Birth:  Mar 29, 1936     BSA:          1.859 m Patient Age:    86 years      BP:           168/75 mmHg Patient Gender: F             HR:  81 bpm. Exam Location:  [facility] Procedure: 2D Echo, Color Doppler and Cardiac Doppler Indications:     R55 Syncope  History:         Patient has no prior history of Echocardiogram examinations.                  Prior CABG; Risk Factors:Hypertension, Diabetes, Dyslipidemia                  and Sleep Apnea.  Sonographer:     Charmayne Sheer Referring Phys:  [id number] SARA-MAIZ A THOMAS Diagnosing Phys: Isaias Cowman MD  Sonographer Comments: No subcostal window. IMPRESSIONS  1. Left ventricular ejection fraction, by estimation, is 55 to 60%. The left ventricle has normal function. The left ventricle has no regional wall motion abnormalities. There is mild left ventricular hypertrophy. Left ventricular diastolic parameters were normal.  2. Right ventricular systolic function is normal. The right ventricular size is normal.  3. The mitral valve is normal in structure. Mild mitral valve regurgitation. No evidence of mitral stenosis.  4. Tricuspid valve regurgitation is mild to moderate.  5. The aortic valve is normal in structure. Aortic valve regurgitation is not visualized. No aortic stenosis is present.  6. The inferior vena cava is normal in size with greater than 50% respiratory variability, suggesting right atrial pressure of 3 mmHg. FINDINGS  Left Ventricle: Left ventricular ejection fraction, by estimation, is 55 to 60%. The left ventricle has normal function. The left ventricle has no regional wall motion abnormalities. The left ventricular internal cavity size was  normal in size. There is  mild left ventricular hypertrophy. Left ventricular diastolic parameters were normal. Right Ventricle: The right ventricular size is normal. No increase in right ventricular wall thickness. Right ventricular systolic function is normal. Left Atrium: Left atrial size was normal in size. Right Atrium: Right atrial size was normal in size. Pericardium: There is no evidence of pericardial effusion. Mitral Valve: The mitral valve is normal in structure. Mild mitral valve regurgitation. No evidence of mitral valve stenosis. Tricuspid Valve: The tricuspid valve is normal in structure. Tricuspid valve regurgitation is mild to moderate. No evidence of tricuspid stenosis. Aortic Valve: The aortic valve is normal in structure. Aortic valve regurgitation is not visualized. No aortic stenosis is present. Aortic valve mean gradient measures 8.0 mmHg. Aortic valve peak gradient measures 12.7 mmHg. Aortic valve area, by VTI measures 0.98 cm. Pulmonic Valve: The pulmonic valve was normal in structure. Pulmonic valve regurgitation is not visualized. No evidence of pulmonic stenosis. Aorta: The aortic root is normal in size and structure. Venous: The inferior vena cava is normal in size with greater than 50% respiratory variability, suggesting right atrial pressure of 3 mmHg. IAS/Shunts: No atrial level shunt detected by color flow Doppler.  LEFT VENTRICLE PLAX 2D LVIDd:         4.08 cm   Diastology LVIDs:         2.91 cm   LV e' medial:    5.98 cm/s LV PW:         1.58 cm   LV E/e' medial:  13.1 LV IVS:        0.89 cm   LV e' lateral:   6.96 cm/s LVOT diam:     1.80 cm   LV E/e' lateral: 11.3 LV SV:         40 LV SV Index:   21 LVOT Area:     2.54 cm  RIGHT VENTRICLE RV Basal diam:  2.46 cm LEFT ATRIUM           Index        RIGHT ATRIUM          Index LA diam:      3.30 cm 1.78 cm/m   RA Area:     8.53 cm LA Vol (A2C): 23.6 ml 12.70 ml/m  RA Volume:   14.30 ml 7.69 ml/m LA Vol (A4C): 51.9 ml 27.92  ml/m  AORTIC VALVE                     PULMONIC VALVE AV Area (Vmax):    1.09 cm      PV Vmax:       0.87 m/s AV Area (Vmean):   0.97 cm      PV Peak grad:  3.0 mmHg AV Area (VTI):     0.98 cm AV Vmax:           178.00 cm/s AV Vmean:          137.000 cm/s AV VTI:            0.404 m AV Peak Grad:      12.7 mmHg AV Mean Grad:      8.0 mmHg LVOT Vmax:         76.40 cm/s LVOT Vmean:        52.400 cm/s LVOT VTI:          0.156 m LVOT/AV VTI ratio: 0.39  AORTA Ao Root diam: 2.70 cm MITRAL VALVE                TRICUSPID VALVE MV Area (PHT): 4.46 cm     TR Peak grad:   23.0 mmHg MV Decel Time: 170 msec     TR Vmax:        240.00 cm/s MV E velocity: 78.40 cm/s MV A velocity: 120.00 cm/s  SHUNTS MV E/A ratio:  0.65         Systemic VTI:  0.16 m                             Systemic Diam: 1.80 cm Isaias Cowman MD Electronically signed by Isaias Cowman MD Signature Date/Time: 02/03/2022/1:35:24 PM    Final    MR BRAIN WO CONTRAST  Result Date: 02/03/2022 CLINICAL DATA:  Syncope EXAM: MRI HEAD WITHOUT CONTRAST TECHNIQUE: Multiplanar, multiecho pulse sequences of the brain and surrounding structures were obtained without intravenous contrast. COMPARISON:  None Available. FINDINGS: Brain: No acute infarct, mass effect or extra-axial collection. No acute or chronic hemorrhage. There is multifocal hyperintense T2-weighted signal within the white matter. Parenchymal volume and CSF spaces are normal. A partially empty sella is incidentally noted. Vascular: Major flow voids are preserved. Skull and upper cervical spine: Normal calvarium and skull base. Visualized upper cervical spine and soft tissues are normal. Sinuses/Orbits:No paranasal sinus fluid levels or advanced mucosal thickening. No mastoid or middle ear effusion. Normal orbits. IMPRESSION: 1. No acute intracranial abnormality. 2. Findings of chronic small vessel ischemia. Electronically Signed   By: Ulyses Jarred M.D.   On: 02/03/2022 01:11   X-ray chest  PA and lateral  Result Date: 02/02/2022 CLINICAL DATA:  Syncopal episode. EXAM: CHEST - 2 VIEW COMPARISON:  July 24, 2021 FINDINGS: Multiple sternal wires and vascular clips are noted. The cardiac silhouette is mildly enlarged. A radiopaque loop recorder device is seen. There is marked severity calcification of  the aortic arch. Both lungs are clear. The visualized skeletal structures are unremarkable. IMPRESSION: 1. Stable cardiomegaly and evidence of prior median sternotomy/CABG. 2. No acute cardiopulmonary disease. Electronically Signed   By: Virgina Norfolk M.D.   On: 02/02/2022 23:30   CT Head Wo Contrast  Result Date: 02/02/2022 CLINICAL DATA:  Recent syncopal episode EXAM: CT HEAD WITHOUT CONTRAST TECHNIQUE: Contiguous axial images were obtained from the base of the skull through the vertex without intravenous contrast. RADIATION DOSE REDUCTION: This exam was performed according to the departmental dose-optimization program which includes automated exposure control, adjustment of the mA and/or kV according to patient size and/or use of iterative reconstruction technique. COMPARISON:  07/24/2021 FINDINGS: Brain: No evidence of acute infarction, hemorrhage, hydrocephalus, extra-axial collection or mass lesion/mass effect. Chronic white matter ischemic changes are noted. Vascular: No hyperdense vessel or unexpected calcification. Skull: Normal. Negative for fracture or focal lesion. Sinuses/Orbits: No acute finding. Other: None. IMPRESSION: Chronic ischemic changes are noted.  No acute abnormality noted. Electronically Signed   By: Inez Catalina M.D.   On: 02/02/2022 20:12    Microbiology: Results for orders placed or performed during the hospital encounter of 02/02/22  Urine Culture     Status: Abnormal   Collection Time: 02/02/22  8:27 AM   Specimen: Urine, Clean Catch  Result Value Ref Range Status   Specimen Description   Final    URINE, CLEAN CATCH Performed at [facility], [address] [place], [place] [address]    Special Requests   Final    NONE Performed at [facility], [address] [place], [place] [address]    Culture >=100,000 COLONIES/mL ESCHERICHIA COLI (A)  Final   Report Status 02/06/2022 FINAL  Final   Organism ID, Bacteria ESCHERICHIA COLI (A)  Final      Susceptibility   Escherichia coli - MIC*    AMPICILLIN <=2 SENSITIVE Sensitive     CEFAZOLIN <=4 SENSITIVE Sensitive     CEFEPIME <=0.12 SENSITIVE Sensitive     CEFTRIAXONE <=0.25 SENSITIVE Sensitive     CIPROFLOXACIN <=0.25 SENSITIVE Sensitive     GENTAMICIN <=1 SENSITIVE Sensitive     IMIPENEM <=0.25 SENSITIVE Sensitive     NITROFURANTOIN <=16 SENSITIVE Sensitive     TRIMETH/SULFA <=20 SENSITIVE Sensitive     AMPICILLIN/SULBACTAM <=2 SENSITIVE Sensitive     PIP/TAZO <=4 SENSITIVE Sensitive     * >=100,000 COLONIES/mL ESCHERICHIA COLI  Culture, blood (Routine X 2) w Reflex to ID Panel     Status: None (Preliminary result)   Collection Time: 02/03/22  3:17 PM   Specimen: BLOOD LEFT HAND  Result Value Ref Range Status   Specimen Description BLOOD LEFT HAND  Final   Special Requests   Final    BOTTLES DRAWN AEROBIC AND ANAEROBIC Blood Culture adequate volume   Culture   Final    NO GROWTH 3 DAYS Performed at [facility], [address], [place], [place] [address]    Report Status PENDING  Incomplete  Culture, blood (Routine X 2) w Reflex to ID Panel     Status: None (Preliminary result)   Collection Time: 02/03/22  3:17 PM   Specimen: BLOOD RIGHT HAND  Result Value Ref Range Status   Specimen Description BLOOD RIGHT HAND  Final   Special Requests   Final    BOTTLES DRAWN AEROBIC AND ANAEROBIC Blood Culture adequate volume   Culture   Final    NO GROWTH 3 DAYS Performed at  [facility], [address] [place], [place] [address]    Report Status PENDING  Incomplete    Labs: CBC: Recent Labs  Lab 02/03/22 0106 02/03/22 0928 02/04/22 0630  02/05/22 0517 02/06/22 0536  WBC 5.8 4.8 9.8 8.5 6.7  NEUTROABS  --  2.3 7.6 5.4 3.8  HGB 12.6 12.8 15.0 13.4 13.3  HCT 41.3 40.9 45.6 41.6 41.2  MCV 86.6 84.9 82.5 83.4 82.6  PLT 199 206 244 239 086   Basic Metabolic Panel: Recent Labs  Lab 02/02/22 1217 02/03/22 0106 02/03/22 0928 02/04/22 0630 02/05/22 0517 02/06/22 0536  NA 138 138  --  135 134* 135  K 3.8 3.6  --  3.4* 4.0 4.1  CL 105 105  --  103 102 103  CO2 24 26  --  20* 22 24  GLUCOSE 155* 175*  --  220* 130* 161*  BUN 12 10  --  12 24* 28*  CREATININE 0.93 0.93  --  0.75 1.51* 1.15*  CALCIUM 9.0 8.5*  --  9.0 8.7* 8.8*  MG  --   --  1.8 1.7 2.7* 2.2  PHOS  --   --  3.9 3.3 4.5 3.8   Liver Function Tests: Recent Labs  Lab 02/03/22 0106 02/04/22 0630 02/05/22 0517 02/06/22 0536  AST 18 19 13* 21  ALT '16 16 12 15  '$ ALKPHOS 63 76 63 54  BILITOT 0.5 0.7 0.7 0.8  PROT 5.9* 7.0 6.2* 6.2*  ALBUMIN 3.1* 3.7 3.2* 3.3*   CBG: Recent Labs  Lab 02/05/22 1634 02/05/22 2048 02/06/22 0435 02/06/22 0741 02/06/22 1133  GLUCAP 162* 160* 144* 170* 242*    Discharge time spent: greater than 30 minutes.  Signed: Fritzi Mandes, MD [facility] 02/06/2022

## 2022-02-06 NOTE — Evaluation (Signed)
Occupational Therapy Evaluation Patient Details Name: Brandi [name] MRN: [id number] DOB: 17-Nov-1935 Today's Date: 02/06/2022   History of Present Illness Pt is an 86 y.o. female with medical history significant of Anemia ,anxiety , hx of vaso vagal syncope, recurrent UTI followed by Stoioff,depression,DMII, Pafib s/p LAA clipping, GERD, HA , HLD, HTN,Obesity , OSA on CPAP, CAD  s/p stent to LAD s/p CABG,PUD, Spinal stenosis, who presents to ED BIB EMS s/p recurrent syncopal episodes at home. Pt also informed PT of previously broken arm, brace not applied due to IV placement.   Clinical Impression   Ms Sawyers was seen for OT evaluation this date. Prior to hospital admission, pt required assist for transfers and ADLs. Pt lives with children available 24/7. Pt presents to acute OT demonstrating impaired ADL performance and functional mobility 2/2 decreased activity tolerance and functional strength/ROM/balance deficits. Pt currently requires MAX A don B socks in sitting. MIN A sit<>stand + RW and to adjust briefs in standing, assist for balance while pulling up over rear. Poor standing tolerance, reports fatigue and sits quickly. Pt reports near baseline functioning however would benefit from continued assistance at home. All education complete, will sign off. Upon hospital discharge, recommend HHOT to maximize pt safety and return to PLOF.    Recommendations for follow up therapy are one component of a multi-disciplinary discharge planning process, led by the attending physician.  Recommendations may be updated based on patient status, additional functional criteria and insurance authorization.   Follow Up Recommendations  Home health OT    Assistance Recommended at Discharge Frequent or constant Supervision/Assistance  Patient can return home with the following A little help with walking and/or transfers;A little help with bathing/dressing/bathroom    Functional Status Assessment  Patient has had  a recent decline in their functional status and demonstrates the ability to make significant improvements in function in a reasonable and predictable amount of time.  Equipment Recommendations  None recommended by OT    Recommendations for Other Services       Precautions / Restrictions Precautions Precautions: Fall Required Braces or Orthoses: Other Brace Other Brace: R wrist brace. unable to don due to IV placement Restrictions Weight Bearing Restrictions: No Other Position/Activity Restrictions: pt stated that she is to wear her brace when she is "up and moving in case she falls" per MD. RUE weight bearing minimized as able.      Mobility Bed Mobility               General bed mobility comments: received and left in chair    Transfers Overall transfer level: Needs assistance Equipment used: Rolling walker (2 wheels) Transfers: Sit to/from Stand Sit to Stand: Min assist           General transfer comment: cues for hand placement and assist to prevent WBing through R wrist      Balance Overall balance assessment: Needs assistance Sitting-balance support: Feet supported Sitting balance-Leahy Scale: Good     Standing balance support: Single extremity supported Standing balance-Leahy Scale: Fair                             ADL either performed or assessed with clinical judgement   ADL Overall ADL's : Needs assistance/impaired  General ADL Comments: MAX A don B socks in sitting. MIN A adjust briefs in standing, assist for balance while pulling up over rear. Poor stadning tolerance, reports fatigue      Pertinent Vitals/Pain Pain Assessment Pain Assessment: No/denies pain     Hand Dominance Right   Extremity/Trunk Assessment Upper Extremity Assessment Upper Extremity Assessment: Generalized weakness   Lower Extremity Assessment Lower Extremity Assessment: Generalized weakness        Communication Communication Communication: No difficulties   Cognition Arousal/Alertness: Awake/alert Behavior During Therapy: WFL for tasks assessed/performed Overall Cognitive Status: Within Functional Limits for tasks assessed                                        Home Living Family/patient expects to be discharged to:: Private residence Living Arrangements: Children Available Help at Discharge: Family;Available 24 hours/day Type of Home: House Home Access: Stairs to enter [employer] of Steps: 3 Entrance Stairs-Rails: Right Home Layout: Two level;Able to live on main level with bedroom/bathroom         Bathroom Toilet: Standard     Home Equipment: Rollator (4 wheels);Shower seat          Prior Functioning/Environment Prior Level of Function : Needs assist       Physical Assist : ADLs (physical)   ADLs (physical): Bathing;Dressing;Toileting;Grooming;IADLs Mobility Comments: utilizes a rollator at all times. family normally assists with stairs to enter/exit the home ADLs Comments: assist bathing/dressing as needed        OT Problem List: Decreased strength;Decreased activity tolerance;Impaired balance (sitting and/or standing)         OT Goals(Current goals can be found in the care plan section) Acute Rehab OT Goals Patient Stated Goal: to go home OT Goal Formulation: With patient Time For Goal Achievement: 02/20/22 Potential to Achieve Goals: Good   AM-PAC OT "6 Clicks" Daily Activity     Outcome Measure Help from another person eating meals?: None Help from another person taking care of personal grooming?: A Little Help from another person toileting, which includes using toliet, bedpan, or urinal?: A Little Help from another person bathing (including washing, rinsing, drying)?: A Lot Help from another person to put on and taking off regular upper body clothing?: A Little Help from another person to put on and taking off  regular lower body clothing?: A Lot 6 Click Score: 17   End of Session    Activity Tolerance: Patient tolerated treatment well;Patient limited by fatigue Patient left: in chair;with call bell/phone within reach;with nursing/sitter in room;with family/visitor present  OT Visit Diagnosis: Unsteadiness on feet (R26.81);Muscle weakness (generalized) (M62.81)                Time: 1116-1130 OT Time Calculation (min): 14 min Charges:  OT General Charges $OT Visit: 1 Visit OT Evaluation $OT Eval Low Complexity: 1 Low  Dessie Coma, M.S. OTR/L  02/06/22, 12:38 PM  ascom [phone]

## 2022-02-06 NOTE — Discharge Instructions (Signed)
Keep log of blood pressure at home and review with cardiology/PCP. Hold Coreg (new med)  if systolic blood pressure less than 130.

## 2022-02-06 NOTE — Care Management Important Message (Signed)
Important Message  Patient Details  Name: Brandi [name] MRN: [id number] Date of Birth: 02/14/1936   Medicare Important Message Given:  Yes     [name] 02/06/2022, 9:33 AM

## 2022-02-08 LAB — CULTURE, BLOOD (ROUTINE X 2)
Culture: NO GROWTH
Culture: NO GROWTH
Special Requests: ADEQUATE
Special Requests: ADEQUATE

## 2022-03-13 ENCOUNTER — Other Ambulatory Visit: Payer: Self-pay | Admitting: Urology

## 2022-04-12 NOTE — Progress Notes (Unsigned)
04/13/2022 8:35 AM   Brandi [name] May 13, 1936 [id number]  Referring provider: Baxter Hire, MD [place],  [place] [address]  Urological history: 1. rUTI's -Contributing factors of age, vaginal atrophy, diabetes, incontinence and constipation -Documented urine cultures over the last year  February 02, 2022 E. Coli - hospitalized for syncopal episode and acute encephalopathy  2.  Mixed incontinence -Contributing factors of age, vaginal atrophy, diabetes, cystocele, HTN, sleep apnea (CPAP), arthritis, obesity, depression and anxiety -PVR ***  3. Nocturia -Risk factors for nocturia: obstructive sleep apnea (CPAP), hypertension, diabetes, arthritis, heart disease, anxiety and depression -PVR ***  4. Right renal cysts -contrast CT (09/2020) - Right renal cysts    No chief complaint on file.   HPI: Brandi [name] is a 86 y.o. female who presents today for to discuss treatment from a prior visit.  Contrast CT (09/2020) - right renal cysts  UA ***  PVR ***     PMH: Past Medical History:  Diagnosis Date   Anemia    Anxiety    Arthritis, degenerative    Chronic cystitis    Depression    Diabetes mellitus without complication (HCC)    GERD (gastroesophageal reflux disease)    Headache    Heart disease    HLD (hyperlipidemia)    HTN (hypertension)    Hypertension    Incontinence in female    Murmur    Obesity    Sleep apnea    Urge incontinence of urine     Surgical History: Past Surgical History:  Procedure Laterality Date   BREAST BIOPSY Right 2004   neg   BUNIONECTOMY     CARDIAC SURGERY     Stent   CHOLECYSTECTOMY     COLONOSCOPY WITH PROPOFOL N/A 06/14/2017   Procedure: COLONOSCOPY WITH PROPOFOL;  Surgeon: Lollie Sails, MD;  Location: [facility] ENDOSCOPY;  Service: Endoscopy;  Laterality: N/A;   JOINT REPLACEMENT     TKR Bilateral   LEFT HEART CATH AND CORONARY ANGIOGRAPHY N/A 05/22/2018   Procedure: LEFT HEART CATH AND  CORONARY ANGIOGRAPHY;  Surgeon: Corey Skains, MD;  Location: [place] [facility] LAB;  Service: Cardiovascular;  Laterality: N/A;   LOOP RECORDER INSERTION N/A 09/20/2016   Procedure: Loop Recorder Insertion;  Surgeon: Isaias Cowman, MD;  Location: [place] [facility];  Service: Cardiovascular;  Laterality: N/A;   SEPTOPLASTY     XI ROBOTIC ASSISTED INGUINAL HERNIA REPAIR WITH MESH Left 10/14/2020   Procedure: XI ROBOTIC ASSISTED INGUINAL HERNIA REPAIR WITH MESH;  Surgeon: Ronny Bacon, MD;  Location: [facility] ORS;  Service: General;  Laterality: Left;    Home Medications:  Allergies as of 04/13/2022       Reactions   Accupril [quinapril Hcl] Rash   Micardis [telmisartan] Other (See Comments)   Dizziness   Quinapril Rash        Medication List        Accurate as of April 12, 2022  8:35 AM. If you have any questions, ask your nurse or doctor.          acetaminophen 500 MG tablet Commonly known as: TYLENOL Take 1,000 mg by mouth 2 (two) times daily.   aspirin EC 81 MG tablet Take 81 mg by mouth daily.   atorvastatin 40 MG tablet Commonly known as: LIPITOR Take 40 mg by mouth every evening.   budesonide-formoterol 80-4.5 MCG/ACT inhaler Commonly known as: SYMBICORT Inhale 2 puffs into the lungs 2 (two) times daily.  carboxymethylcellulose 0.5 % Soln Commonly known as: REFRESH PLUS Apply 1-2 drops to eye at bedtime.   carvedilol 12.5 MG tablet Commonly known as: COREG Take 1 tablet (12.5 mg total) by mouth 2 (two) times daily. HOLD IF SBP <130   famotidine 20 MG tablet Commonly known as: PEPCID Take 20 mg by mouth 2 (two) times daily.   gabapentin 400 MG capsule Commonly known as: NEURONTIN Take 400 mg by mouth in the morning and at bedtime.   glipiZIDE 5 MG tablet Commonly known as: GLUCOTROL Take 1 tablet (5 mg total) by mouth daily.   Janumet XR 50-1000 MG Tb24 Generic drug: SitaGLIPtin-MetFORMIN HCl Take 1 tablet by mouth 2 (two) times  daily.   Lantus SoloStar 100 UNIT/ML Solostar Pen Generic drug: insulin glargine Inject 12 Units into the skin daily.   losartan 100 MG tablet Commonly known as: COZAAR Take 100 mg by mouth daily.   Melatonin 10 MG Tabs Take 1 tablet by mouth at bedtime.   Myrbetriq 25 MG Tb24 tablet Generic drug: mirabegron ER TAKE 1 TABLET(25 MG) BY MOUTH DAILY   PreserVision/Lutein Caps Take 1 capsule by mouth 2 (two) times daily.   senna-docusate 8.6-50 MG tablet Commonly known as: Senokot-S Take 1 tablet by mouth at bedtime as needed for mild constipation.   sodium chloride 0.65 % nasal spray Commonly known as: OCEAN Place 1 spray into the nose 2 (two) times daily.   Verapamil HCl CR 300 MG Cp24 Take 1 capsule by mouth daily.   VITAMIN B-12 PO Take 1 tablet by mouth daily.   Vitamin D-3 125 MCG (5000 UT) Tabs Take 5,000 mcg by mouth daily in the afternoon.        Allergies:  Allergies  Allergen Reactions   Accupril [Quinapril Hcl] Rash   Micardis [Telmisartan] Other (See Comments)    Dizziness   Quinapril Rash    Family History: Family History  Problem Relation Age of Onset   Breast cancer Cousin    Skin cancer Other    Diabetes Other    Hypercholesterolemia Other    Hypertension Other    Bladder Cancer Maternal Aunt    COPD Other    Kidney disease Neg Hx    Kidney cancer Neg Hx     Social History:  reports that she has never smoked. She has never used smokeless tobacco. She reports that she does not drink alcohol and does not use drugs.  ROS: Pertinent ROS in HPI  Physical Exam: There were no vitals taken for this visit.  Constitutional:  Well nourished. Alert and oriented, No acute distress. HEENT: [place] AT, moist mucus membranes.  Trachea midline, no masses. Cardiovascular: No clubbing, cyanosis, or edema. Respiratory: Normal respiratory effort, no increased work of breathing. GU: No CVA tenderness.  No bladder fullness or masses. Vulvovaginal atrophy w/  pallor, loss of rugae, introital retraction, excoriations.  Vulvar thinning, fusion of labia, clitoral hood retraction, prominent urethral meatus.   *** external genitalia, *** pubic hair distribution, no lesions.  Normal urethral meatus, no lesions, no prolapse, no discharge.   No urethral masses, tenderness and/or tenderness. No bladder fullness, tenderness or masses. *** vagina mucosa, *** estrogen effect, no discharge, no lesions, *** pelvic support, *** cystocele and *** rectocele noted.  No cervical motion tenderness.  Uterus is freely mobile and non-fixed.  No adnexal/parametria masses or tenderness noted.  Anus and perineum are without rashes or lesions.   ***  Neurologic: Grossly intact, no focal deficits, moving all  4 extremities. Psychiatric: Normal mood and affect.    Laboratory Data: Hemoglobin A1C 4.2 - 5.6 % 7.6 High    Average Blood Glucose (Calc) mg/dL 171   [place] - LAB  Narrative Performed by [facility] - LAB Normal Range:    4.2 - 5.6%  Increased Risk:  5.7 - 6.4%  Diabetes:        >= 6.5%  Glycemic Control for adults with diabetes:  <7%    Specimen Collected: 03/13/22 09:01   Performed by: [place]: 03/13/22 11:13  Received From: [place]  Result Received: 04/06/22 15:46   Glucose 70 - 110 mg/dL 80   Sodium 136 - 145 mmol/L 140   Potassium 3.6 - 5.1 mmol/L 3.8   Chloride 97 - 109 mmol/L 104   Carbon Dioxide (CO2) 22.0 - 32.0 mmol/L 27.7   Urea Nitrogen (BUN) 7 - 25 mg/dL 10   Creatinine 0.6 - 1.1 mg/dL 0.8   Glomerular Filtration Rate (eGFR) >60 mL/min/1.73sq m 73   Comment: CKD-EPI (2021) does not include patient's race in the calculation of eGFR.  Monitoring changes of plasma creatinine and eGFR over time is useful for monitoring kidney function.   Interpretive Ranges for eGFR (CKD-EPI 2021):   eGFR:       >60 mL/min/1.73 sq. m - Normal  eGFR:       30-59 mL/min/1.73  sq. m - Moderately Decreased  eGFR:       15-29 mL/min/1.73 sq. m  - Severely Decreased  eGFR:       < 15 mL/min/1.73 sq. m  - Kidney Failure    Note: These eGFR calculations do not apply in acute situations when eGFR is changing rapidly or patients on dialysis.  Calcium 8.7 - 10.3 mg/dL 9.1   AST  8 - 39 U/L 13   ALT  5 - 38 U/L 13   Alk Phos (alkaline Phosphatase) 34 - 104 U/L 76   Albumin 3.5 - 4.8 g/dL 3.7   Bilirubin, Total 0.3 - 1.2 mg/dL 0.4   Protein, Total 6.1 - 7.9 g/dL 6.1   A/G Ratio 1.0 - 5.0 gm/dL 1.5   Resulting Agency  [place] - LAB   Specimen Collected: 03/13/22 09:01   Performed by: [place]: 03/13/22 12:43  Received From: [place]  Result Received: 04/06/22 15:46   WBC (White Blood Cell Count) 4.1 - 10.2 10^3/uL 6.2   RBC (Red Blood Cell Count) 4.04 - 5.48 10^6/uL 4.91   Hemoglobin 12.0 - 15.0 gm/dL 13.4   Hematocrit 35.0 - 47.0 % 41.7   MCV (Mean Corpuscular Volume) 80.0 - 100.0 fl 84.9   MCH (Mean Corpuscular Hemoglobin) 27.0 - 31.2 pg 27.3   MCHC (Mean Corpuscular Hemoglobin Concentration) 32.0 - 36.0 gm/dL 32.1   Platelet Count 150 - 450 10^3/uL 207   RDW-CV (Red Cell Distribution Width) 11.6 - 14.8 % 14.4   MPV (Mean Platelet Volume) 9.4 - 12.4 fl 11.8   Neutrophils 1.50 - 7.80 10^3/uL 2.75   Lymphocytes 1.00 - 3.60 10^3/uL 2.48   Monocytes 0.00 - 1.50 10^3/uL 0.53   Eosinophils 0.00 - 0.55 10^3/uL 0.37   Basophils 0.00 - 0.09 10^3/uL 0.05   Neutrophil % 32.0 - 70.0 % 44.3   Lymphocyte % 10.0 - 50.0 % 40.1   Monocyte % 4.0 - 13.0 % 8.6   Eosinophil % 1.0 -  5.0 % 6.0 High    Basophil% 0.0 - 2.0 % 0.8   Immature Granulocyte % <=0.7 % 0.2   Immature Granulocyte Count <=0.06 10^3/L 0.01   Resulting Agency  [place] - LAB   Specimen Collected: 03/13/22 09:01   Performed by: [place]: 03/13/22 10:06  Received From: [place]   Result Received: 04/06/22 15:46  Urinalysis ***  I have reviewed the labs.   Pertinent Imaging: Narrative & Impression  CLINICAL DATA:  Acute abdominal pain. Urinary frequency with left costo vertebral ankle tenderness.   EXAM: CT ABDOMEN AND PELVIS WITH CONTRAST   TECHNIQUE: Multidetector CT imaging of the abdomen and pelvis was performed using the standard protocol following bolus administration of intravenous contrast.   CONTRAST:  159m OMNIPAQUE IOHEXOL 300 MG/ML  SOLN   COMPARISON:  Most recent CT 04/13/2020   FINDINGS: Lower chest: Upper normal heart size with coronary artery calcifications and mitral annulus calcifications. Linear atelectasis in both lower lobes.   Hepatobiliary: No focal hepatic abnormality. Postcholecystectomy with stable intrahepatic biliary ductal dilatation. Stable common bile duct. No visualized choledocholithiasis.   Pancreas: Fatty atrophy.  No ductal dilatation or inflammation.   Spleen: Normal in size without focal abnormality.   Adrenals/Urinary Tract: Normal adrenal glands. No hydronephrosis. No visualized renal calculi. Homogeneous enhancement with symmetric excretion on delayed phase imaging. Right renal cysts. No suspicious solid lesion. Partially distended urinary bladder   Stomach/Bowel: Moderate hiatal hernia. Stomach is decompressed. Dilated fluid-filled loops of small bowel, greatest dimension 3.7 cm in the lower abdomen. Small bowel transition point appears to be within the left inguinal hernia that contains distal most ileum, with fecalization of small bowel contents. Mild mesenteric edema of central small bowel mesentery with adjacent edema. No bowel pneumatosis or perforation. Lax cecal mesentery. The cecum is low-lying in the pelvic midline. Normal appendix. Moderate stool burden in the right and transverse colon. Small volume of stool in the descending and sigmoid colon.   Vascular/Lymphatic: Moderate aortic  atherosclerosis. No aortic aneurysm. Patent portal vein. No adenopathy.   Reproductive: Atrophic uterus. There is increased bilateral adnexal vascularity. No adnexal mass.   Other: Mesenteric edema in the pelvis with trace mesenteric free fluid. No upper abdominal ascites. Left inguinal hernia contains short segment of distal ileum with subsequent obstruction. There is also a small fat containing umbilical hernia.   Musculoskeletal: Remote right pelvic fracture. Degenerative change in the spine. There are no acute or suspicious osseous abnormalities.   IMPRESSION: 1. Small bowel obstruction with transition point in the left inguinal hernia containing short segment of distal ileum. 2. Laxity of the cecal mesentery, cecum now located in the midline of the pelvis. 3. Small fat containing umbilical hernia. 4. Moderate hiatal hernia. 5. Increased bilateral adnexal vascularity, can be seen with pelvic congestion syndrome in the appropriate clinical setting.   Aortic Atherosclerosis (ICD10-I70.0).     Electronically Signed   By: MKeith RakeM.D.   On: 10/13/2020 23:17    I have independently reviewed the films.    Assessment & Plan:    1. Mixed incontinence  - offered behavioral therapies  - bladder training  - bladder control strategies  - pelvic floor muscle training  - fluid management   - offered medical therapy with anticholinergic therapy or beta-3 adrenergic receptor agonist and the potential side effects of each therapy ***  - offered refer to gynecology for a pessary fitting ***  - offered an  appointment with one of our surgeon for a possible pelvic sling procedure ***  - would like to try the beta-3 adrenergic receptor agonist (Myrbetriq).  Given Myrbetriq 25 mg samples, #28.  I have reviewed with the patient of the side effects of Myrbetriq, such as: elevation in BP, urinary retention and/or HA.  She will return in one month for PVR and symptom recheck.  ***  -  would like to try anticholinergic therapy.  Given Vesicare 4m/10mg, Toviaz 415m8mg samples, # 28.   Advised of the side effects, such as: Dry eyes, dry mouth, constipation, mental confusion and/or urinary retention. ***  - RTC in 3 weeks for PVR and symptom recheck ***    No follow-ups on file.  These notes generated with voice recognition software. I apologize for typographical errors.  SHCentralPAJacumba222 Railroad LaneSuTrailuLas FloresNC [id number]

## 2022-04-13 ENCOUNTER — Ambulatory Visit: Payer: Medicare PPO | Admitting: Urology

## 2022-04-13 ENCOUNTER — Encounter: Payer: Self-pay | Admitting: Urology

## 2022-04-13 VITALS — BP 163/80 | HR 73 | Ht 61.0 in | Wt 198.0 lb

## 2022-04-13 DIAGNOSIS — R351 Nocturia: Secondary | ICD-10-CM

## 2022-04-13 DIAGNOSIS — N3946 Mixed incontinence: Secondary | ICD-10-CM | POA: Diagnosis not present

## 2022-04-13 LAB — BLADDER SCAN AMB NON-IMAGING

## 2022-05-03 NOTE — Progress Notes (Signed)
05/04/2022 6:52 PM   Brandi [name] 04/23/1936 [id number]  Referring provider: Baxter Hire, MD [place],  [place] [address]  Urological history: 1. rUTI's -Contributing factors of age, vaginal atrophy, diabetes, incontinence and constipation -Documented urine cultures over the last year  February 02, 2022 E. Coli - hospitalized for syncopal episode and acute encephalopathy  2.  Mixed incontinence -Contributing factors of age, vaginal atrophy, diabetes, cystocele, HTN, sleep apnea (CPAP), arthritis, obesity, depression and anxiety -PVR 0 mL  3. Nocturia -Risk factors for nocturia: obstructive sleep apnea (CPAP), hypertension, diabetes, arthritis, heart disease, anxiety and depression -PVR 0 mL  4. Right renal cysts -contrast CT (09/2020) - Right renal cysts    Chief Complaint  Patient presents with   Urinary Incontinence   Follow-up    HPI: Brandi [name] is a 86 y.o. female who presents today for to discuss treatment from a prior visit with her husband, Brandi [name].   Contrast CT (09/2020) - right renal cysts  At her visit on 04/13/2022, she indicated she is having 1-7 daytime urination, 3 or more nighttime urinations.  She is having mixed urinary incontinence.  She leaks 1-2 times daily.  She wears absorbent pads daily going through 3-5 depends daily.  She does engage in fluid restriction and toilet mapping.  PVR 80 mL.  She is also interested in learning more about the Axonics implant that she has seen advertised on TV.  We increased her Myrbetriq from 25 mg daily to 50 mg daily.  I explained that we do not perform the Axonics implant here in our office, but that her diabetic neuropathy may exclude her from insurance coverage for the device.  She did not desire referral to a practitioner that specializes in Axonics implants.  I also encouraged her to sleep with her CPAP machine.  Today, she indicates on her OAB questionnaire she is having 1-7 daytime voids,  1-2 nighttime voids and strong urge to urinate.  She wears 4 absorbent pads daily.  Patient denies any modifying or aggravating factors.  Patient denies any gross hematuria, dysuria or suprapubic/flank pain.  Patient denies any fevers, chills, nausea or vomiting.    She is at goal with Myrbetriq 50 mg daily.  UA yellow cloudy, specific gravity 1.010, trace blood, pH 6.5, nitrate positive, 3+ leukocytes, greater than 30 WBCs, 0-2 RBCs, 0-10 epithelial cells and many bacteria.  PVR 0 mL   PMH: Past Medical History:  Diagnosis Date   Anemia    Anxiety    Arthritis, degenerative    Chronic cystitis    Depression    Diabetes mellitus without complication (HCC)    GERD (gastroesophageal reflux disease)    Headache    Heart disease    HLD (hyperlipidemia)    HTN (hypertension)    Hypertension    Incontinence in female    Murmur    Obesity    Sleep apnea    Urge incontinence of urine     Surgical History: Past Surgical History:  Procedure Laterality Date   BREAST BIOPSY Right 2004   neg   BUNIONECTOMY     CARDIAC SURGERY     Stent   CHOLECYSTECTOMY     COLONOSCOPY WITH PROPOFOL N/A 06/14/2017   Procedure: COLONOSCOPY WITH PROPOFOL;  Surgeon: Lollie Sails, MD;  Location: [facility] ENDOSCOPY;  Service: Endoscopy;  Laterality: N/A;   JOINT REPLACEMENT     TKR Bilateral   LEFT HEART CATH AND CORONARY ANGIOGRAPHY N/A  05/22/2018   Procedure: LEFT HEART CATH AND CORONARY ANGIOGRAPHY;  Surgeon: Corey Skains, MD;  Location: [place] CV LAB;  Service: Cardiovascular;  Laterality: N/A;   LOOP RECORDER INSERTION N/A 09/20/2016   Procedure: Loop Recorder Insertion;  Surgeon: Isaias Cowman, MD;  Location: [place] [facility] LAB;  Service: Cardiovascular;  Laterality: N/A;   SEPTOPLASTY     XI ROBOTIC ASSISTED INGUINAL HERNIA REPAIR WITH MESH Left 10/14/2020   Procedure: XI ROBOTIC ASSISTED INGUINAL HERNIA REPAIR WITH MESH;  Surgeon: Ronny Bacon, MD;  Location: [facility] ORS;   Service: General;  Laterality: Left;    Home Medications:  Allergies as of 05/04/2022       Reactions   Accupril [quinapril Hcl] Rash   Micardis [telmisartan] Other (See Comments)   Dizziness   Quinapril Rash        Medication List        Accurate as of May 04, 2022 11:59 PM. If you have any questions, ask your nurse or doctor.          acetaminophen 500 MG tablet Commonly known as: TYLENOL Take 1,000 mg by mouth 2 (two) times daily.   aspirin EC 81 MG tablet Take 81 mg by mouth daily.   atorvastatin 40 MG tablet Commonly known as: LIPITOR Take 40 mg by mouth every evening.   budesonide-formoterol 80-4.5 MCG/ACT inhaler Commonly known as: SYMBICORT Inhale 2 puffs into the lungs 2 (two) times daily.   carboxymethylcellulose 0.5 % Soln Commonly known as: REFRESH PLUS Apply 1-2 drops to eye at bedtime.   carvedilol 12.5 MG tablet Commonly known as: COREG Take 1 tablet (12.5 mg total) by mouth 2 (two) times daily. HOLD IF SBP <130   famotidine 20 MG tablet Commonly known as: PEPCID Take 20 mg by mouth 2 (two) times daily.   gabapentin 400 MG capsule Commonly known as: NEURONTIN Take 400 mg by mouth in the morning and at bedtime.   glipiZIDE 5 MG tablet Commonly known as: GLUCOTROL Take 1 tablet (5 mg total) by mouth daily.   Janumet XR 50-1000 MG Tb24 Generic drug: SitaGLIPtin-MetFORMIN HCl Take 1 tablet by mouth 2 (two) times daily.   Lantus SoloStar 100 UNIT/ML Solostar Pen Generic drug: insulin glargine Inject 12 Units into the skin daily.   losartan 100 MG tablet Commonly known as: COZAAR Take 100 mg by mouth daily.   Melatonin 10 MG Tabs Take 1 tablet by mouth at bedtime.   mirabegron ER 50 MG Tb24 tablet Commonly known as: MYRBETRIQ Take 1 tablet (50 mg total) by mouth daily. What changed:  medication strength See the new instructions.   PreserVision/Lutein Caps Take 1 capsule by mouth 2 (two) times daily.   senna-docusate  8.6-50 MG tablet Commonly known as: Senokot-S Take 1 tablet by mouth at bedtime as needed for mild constipation.   sodium chloride 0.65 % nasal spray Commonly known as: OCEAN Place 1 spray into the nose 2 (two) times daily.   Verapamil HCl CR 300 MG Cp24 Take 1 capsule by mouth daily.   VITAMIN B-12 PO Take 1 tablet by mouth daily.   Vitamin D-3 125 MCG (5000 UT) Tabs Take 5,000 mcg by mouth daily in the afternoon.        Allergies:  Allergies  Allergen Reactions   Accupril [Quinapril Hcl] Rash   Micardis [Telmisartan] Other (See Comments)    Dizziness   Quinapril Rash    Family History: Family History  Problem Relation Age of Onset  Breast cancer Cousin    Skin cancer Other    Diabetes Other    Hypercholesterolemia Other    Hypertension Other    Bladder Cancer Maternal Aunt    COPD Other    Kidney disease Neg Hx    Kidney cancer Neg Hx     Social History:  reports that she has never smoked. She has never used smokeless tobacco. She reports that she does not drink alcohol and does not use drugs.  ROS: Pertinent ROS in HPI  Physical Exam: BP 139/79   Pulse 82   Ht '5\' 1"'$  (1.549 m)   Wt 198 lb (89.8 kg)   BMI 37.41 kg/m   Constitutional:  Well nourished. Alert and oriented, No acute distress. HEENT: [place] AT, moist mucus membranes.  Trachea midline Cardiovascular: No clubbing, cyanosis, or edema. Respiratory: Normal respiratory effort, no increased work of breathing. Psychiatric: Normal mood and affect.    Laboratory data See HPI and EPIC I have reviewed the labs.   Pertinent Imaging:  05/04/22 11:26  Scan Result 64m     Assessment & Plan:    1. Mixed incontinence -Urine not sent for culture at this time as she is asymptomatic and most likely colonized -at goal w/ Myrbetriq 50 mg daily   2. Nocturia -not bothersome    Return in about 1 year (around 05/05/2023) for PVR and OAB questionnaire.  These notes generated with voice recognition  software. I apologize for typographical errors.  SC[place] PCleveland[address] SCenterBLa Mesa [place] [phone]

## 2022-05-04 ENCOUNTER — Ambulatory Visit: Payer: Medicare PPO | Admitting: Urology

## 2022-05-04 VITALS — BP 139/79 | HR 82 | Ht 61.0 in | Wt 198.0 lb

## 2022-05-04 DIAGNOSIS — N3946 Mixed incontinence: Secondary | ICD-10-CM | POA: Diagnosis not present

## 2022-05-04 DIAGNOSIS — R351 Nocturia: Secondary | ICD-10-CM

## 2022-05-04 LAB — URINALYSIS, COMPLETE
Bilirubin, UA: NEGATIVE
Glucose, UA: NEGATIVE
Ketones, UA: NEGATIVE
Nitrite, UA: POSITIVE — AB
Protein,UA: NEGATIVE
Specific Gravity, UA: 1.01 (ref 1.005–1.030)
Urobilinogen, Ur: 1 mg/dL (ref 0.2–1.0)
pH, UA: 6.5 (ref 5.0–7.5)

## 2022-05-04 LAB — BLADDER SCAN AMB NON-IMAGING

## 2022-05-04 LAB — MICROSCOPIC EXAMINATION: WBC, UA: >30 /hpf — AB (ref 0–5)

## 2022-05-04 MED ORDER — MIRABEGRON ER 50 MG PO TB24: 50.0000 mg | ORAL_TABLET | Freq: Every day | ORAL | 3 refills | Status: DC

## 2022-05-05 ENCOUNTER — Encounter: Payer: Self-pay | Admitting: Urology

## 2022-07-25 ENCOUNTER — Other Ambulatory Visit: Payer: Self-pay | Admitting: Internal Medicine

## 2022-07-25 DIAGNOSIS — R10813 Right lower quadrant abdominal tenderness: Secondary | ICD-10-CM

## 2022-08-03 ENCOUNTER — Ambulatory Visit
Admission: RE | Admit: 2022-08-03 | Discharge: 2022-08-03 | Disposition: A | Discharge status: Home / Self Care | Payer: Medicare PPO | Source: Ambulatory Visit | Attending: Internal Medicine | Admitting: Internal Medicine

## 2022-08-03 DIAGNOSIS — R10813 Right lower quadrant abdominal tenderness: Secondary | ICD-10-CM | POA: Diagnosis present

## 2022-08-03 MED ORDER — IOHEXOL 300 MG/ML  SOLN
100.0000 mL | Freq: Once | INTRAMUSCULAR | Status: AC | PRN
  Administered 2022-08-03: 100 mL via INTRAVENOUS

## 2022-09-13 DIAGNOSIS — R9389 Abnormal findings on diagnostic imaging of other specified body structures: Secondary | ICD-10-CM | POA: Insufficient documentation

## 2022-09-26 ENCOUNTER — Other Ambulatory Visit: Payer: Medicare PPO

## 2022-10-06 NOTE — H&P (Addendum)
Brandi [name] is a 87 y.o. female here for Fx d+c and hysteroscopy .pt with PMB . Thickened Endometrium on CT scan    Pt still with PMB   TVUS today :    Uterus: Anteverted   Endometrium= fluid-filled, Total w/fluid= 13.72mm, Endo= 1.78mm   Rt ovary not seen Left ovary not seen No free fluid seen     Past Medical History:  has a past medical history of Anemia, unspecified, ASCVD (arteriosclerotic cardiovascular disease), Coronary arteriosclerosis due to lipid rich plaque (01/06/2014), Coronary artery disease, Essential hypertension, benign, Hemorrhage of rectum and anus, Left wrist fracture (04/2016), Obesity, On continuous heparin infusion (05/27/2018), Osteoarthritis, Other and unspecified hyperlipidemia, Pelvic fracture (CMS-HCC), Pernicious anemia, Presence of drug coated stent in LAD coronary artery (05/27/2018), PUD (peptic ulcer disease), Recurrent UTI, Right ulnar fracture (07/2021), Spinal stenosis, and Type 2 diabetes mellitus (CMS-HCC).  Past Surgical History:  has a past surgical history that includes Cholecystectomy; Bunionectomy (Right); Repair Nasal Septal Perforations; ercp (1998); Knee arthroscopy (Right, 2004); Knee arthroscopy (Left, 09/2008); Cardiac catheterization; Coronary angioplasty; Colonoscopy (06/14/2017); coronary stent (2012); coronary artery bypass w/single artery graft (N/A, 05/29/2018); coronary artery bypass w/vein only (N/A, 05/29/2018); application wound vac (N/A, 05/29/2018); and Coronary artery bypass graft. Family History: family history includes Alzheimer's disease in her mother; Autism in her maternal aunt; Myocardial Infarction (Heart attack) in her mother; No Known Problems in her brother, daughter, father, maternal grandfather, maternal grandmother, maternal uncle, paternal aunt, paternal grandfather, paternal grandmother, paternal uncle, sister, and son. Social History:  reports that she has never smoked. She has never used smokeless tobacco. She reports that  she does not drink alcohol and does not use drugs. OB/GYN History:  OB History       Gravida  4   Para  3   Term  3   Preterm      AB      Living  3        SAB      IAB      Ectopic      Molar      Multiple      Live Births  3             Allergies: is allergic to accupril [quinapril] and micardis [telmisartan]. Medications:  Current Medications    Current Outpatient Medications:    acetaminophen (TYLENOL) 500 MG tablet, Take 1,000 mg by mouth 2 (two) times daily   , Disp: , Rfl:    aspirin 81 MG EC tablet, Take 81 mg by mouth once daily., Disp: , Rfl:    atorvastatin (LIPITOR) 40 MG tablet, TAKE 1 TABLET(40 MG) BY MOUTH EVERY NIGHT, Disp: 90 tablet, Rfl: 1   blood glucose diagnostic (ACCU-CHEK AVIVA PLUS TEST STRP) test strip, 3 (three) times daily Use as instructed to test blood sugar, Disp: 200 strip, Rfl: 4   calcium carbonate-vitamin D3 (CALTRATE 600+D) 600 mg-10 mcg (400 unit) tablet, Take 1 tablet by mouth 2 (two) times daily with meals, Disp: 180 tablet, Rfl: 0   carboxymethylcellulose (REFRESH TEARS) 0.5 % ophthalmic solution, Place 1 drop into both eyes nightly, Disp: , Rfl:    carvediloL (COREG) 12.5 MG tablet, Take 1 tablet (12.5 mg total) by mouth 2 (two) times daily with meals, Disp: 180 tablet, Rfl: 1   cyanocobalamin (VITAMIN B12) 1000 MCG tablet, Take 1,000 mcg by mouth once daily  , Disp: , Rfl:    famotidine (PEPCID) 20 MG tablet, Take 20 mg by mouth 2 (  two) times daily, Disp: , Rfl:    ferrous sulfate 325 (65 FE) MG tablet, Take 325 mg by mouth once daily, Disp: , Rfl:    fluticasone propionate (FLONASE) 50 mcg/actuation nasal spray, Place 1 spray into both nostrils 2 (two) times daily, Disp: 16 g, Rfl: 0   gabapentin (NEURONTIN) 400 MG capsule, TAKE 1 CAPSULE BY MOUTH TWICE DAILY FOR TREMORS OR NERVE PAIN, Disp: 180 capsule, Rfl: 0   glipiZIDE (GLUCOTROL) 5 MG tablet, TAKE 1 TABLET(5 MG) BY MOUTH EVERY MORNING BEFORE BREAKFAST, Disp: 90  tablet, Rfl: 1   insulin GLARGINE (LANTUS SOLOSTAR U-100 INSULIN) pen injector (concentration 100 units/mL), INJECT 12 UNITS UNDER THE SKIN ONCE DAILY, Disp: 15 mL, Rfl: 3   JANUMET XR 50-1,000 mg ER 24 hr mutliphase tablet, TAKE 1 TABLET BY MOUTH TWICE DAILY, Disp: 180 tablet, Rfl: 3   lancets (ACCU-CHEK SOFTCLIX LANCETS), USE AS DIRECTED TWICE DAILY, Disp: 200 each, Rfl: 3   loratadine (CLARITIN) 10 mg tablet, Take 10 mg by mouth once daily, Disp: , Rfl:    losartan (COZAAR) 100 MG tablet, TAKE 1 TABLET(100 MG) BY MOUTH EVERY NIGHT, Disp: 90 tablet, Rfl: 3   melatonin 10 mg Tab, Take 1 tablet by mouth nightly, Disp: , Rfl:    mirabegron (MYRBETRIQ) 50 mg ER tablet, Take 50 mg by mouth once daily, Disp: , Rfl:    multivitamin with minerals, EYE, (PRESERVISION AREDS 2) soft gel capsule, Take 1 capsule by mouth 2 (two) times daily with meals   , Disp: , Rfl:    nystatin (MYCOSTATIN) 100,000 unit/gram powder, Apply topically 2 (two) times daily, Disp: 30 g, Rfl: 1   SYMBICORT 80-4.5 mcg/actuation inhaler, Inhale 2 inhalations into the lungs 2 (two) times daily, Disp: 10.2 g, Rfl: 2   triamcinolone 0.5 % cream, Apply topically 2 (two) times daily, Disp: 30 g, Rfl: 1   verapamiL (VERELAN PM) 300 mg ER capsule, TAKE 1 CAPSULE(300 MG) BY MOUTH AT BEDTIME, Disp: 90 capsule, Rfl: 0   nitroGLYcerin (NITROSTAT) 0.4 MG SL tablet, Place 1 tablet (0.4 mg total) under the tongue every 5 (five) minutes as needed for Chest pain May take up to 3 doses., Disp: 25 tablet, Rfl: 11     Review of Systems: General:                      No fatigue or weight loss Eyes:                           No vision changes Ears:                            No hearing difficulty Respiratory:                No cough or shortness of breath Pulmonary:                  No asthma or shortness of breath Cardiovascular:           No chest pain, palpitations, dyspnea on exertion Gastrointestinal:          No abdominal bloating, chronic  diarrhea, constipations, masses, pain or hematochezia Genitourinary:             No hematuria, dysuria, abnormal vaginal discharge, pelvic pain, Menometrorrhagia, + PMB  Lymphatic:  No swollen lymph nodes Musculoskeletal:No muscle weakness Neurologic:                  No extremity weakness, syncope, seizure disorder Psychiatric:                  No history of depression, delusions or suicidal/homicidal ideation      Exam:       Vitals:    10/06/22 1044  BP: 115/76  Pulse: 76      Body mass index is 38.73 kg/m.   WDWN white/  female in NAD   Lungs: CTA  CV : RRR without murmur   Abdomen: soft , no mass, normal active bowel sounds,  non-tender, no rebound tenderness Pelvic: tanner stage 5 ,  External genitalia: vulva /labia no lesions Urethra: no prolapse Vagina: + stenosis. No blood noted  Cervix:  pin point external os no lesions, no cervical motion tenderness   Uterus: normal size shape and contour, non-tender Adnexa: no mass,  non-tender   Rectovaginal:    Impression:    The primary encounter diagnosis was Thickened endometrium. A diagnosis of PMB (postmenopausal bleeding) was also pertinent to this visit.       Plan:    Given bleeding eventhough the cumulative separate stripes added together are normal she is still bleeding . R/o EIN    Recommend Fx D+C and H/S . Technically may be difficult due to her vaginal and cervical stenosis    cardiology clearance obtained and records sent to pre op   Pt desires full resuscitation during the procedure if need be   .   Vilma Prader, MD

## 2022-10-14 ENCOUNTER — Encounter: Payer: Self-pay | Admitting: Urgent Care

## 2022-10-17 ENCOUNTER — Other Ambulatory Visit: Payer: Self-pay | Admitting: Internal Medicine

## 2022-10-17 DIAGNOSIS — M79604 Pain in right leg: Secondary | ICD-10-CM

## 2022-10-18 ENCOUNTER — Encounter
Admission: RE | Admit: 2022-10-18 | Discharge: 2022-10-18 | Disposition: A | Discharge status: Home / Self Care | Payer: Medicare PPO | Source: Ambulatory Visit | Attending: Obstetrics and Gynecology | Admitting: Obstetrics and Gynecology

## 2022-10-18 ENCOUNTER — Ambulatory Visit
Admission: RE | Admit: 2022-10-18 | Discharge: 2022-10-18 | Disposition: A | Discharge status: Home / Self Care | Payer: Medicare PPO | Source: Ambulatory Visit | Attending: Internal Medicine | Admitting: Internal Medicine

## 2022-10-18 DIAGNOSIS — M79604 Pain in right leg: Secondary | ICD-10-CM

## 2022-10-18 DIAGNOSIS — Z01818 Encounter for other preprocedural examination: Secondary | ICD-10-CM | POA: Insufficient documentation

## 2022-10-18 DIAGNOSIS — R55 Syncope and collapse: Secondary | ICD-10-CM

## 2022-10-18 HISTORY — DX: Unspecified atrial fibrillation: I48.91

## 2022-10-18 HISTORY — DX: Nausea with vomiting, unspecified: R11.2

## 2022-10-18 HISTORY — DX: Type 2 diabetes mellitus with diabetic neuropathy, unspecified: E11.40

## 2022-10-18 HISTORY — DX: Unstable angina: I20.0

## 2022-10-18 HISTORY — DX: Presence of coronary angioplasty implant and graft: Z95.5

## 2022-10-18 HISTORY — DX: Syncope and collapse: R55

## 2022-10-18 HISTORY — DX: Dyspnea, unspecified: R06.00

## 2022-10-18 HISTORY — DX: Obstructive sleep apnea (adult) (pediatric): G47.33

## 2022-10-18 HISTORY — DX: Cystocele, unspecified: N81.10

## 2022-10-18 HISTORY — DX: Cyst of kidney, acquired: N28.1

## 2022-10-18 HISTORY — DX: Sepsis, unspecified organism: A41.9

## 2022-10-18 HISTORY — DX: Other specified postprocedural states: Z98.890

## 2022-10-18 HISTORY — DX: Atherosclerotic heart disease of native coronary artery without angina pectoris: I25.10

## 2022-10-18 HISTORY — DX: Presence of aortocoronary bypass graft: Z95.1

## 2022-10-18 NOTE — Patient Instructions (Addendum)
Your procedure is scheduled on:10-26-22 Thursday Report to the Registration Desk on the 1st floor of the [facility].Then proceed to the 2nd floor Surgery Desk To find out your arrival time, please call [phone] between 1PM - 3PM on:10-25-22 Wednesday If your arrival time is 6:00 am, do not arrive before that time as the [facility] entrance doors do not open until 6:00 am.  REMEMBER: Instructions that are not followed completely may result in serious medical risk, up to and including death; or upon the discretion of your surgeon and anesthesiologist your surgery may need to be rescheduled.  Do not eat food after midnight the night before surgery.  No gum chewing or hard candies.  You may however, drink Water up to 2 hours before you are scheduled to arrive for your surgery. Do not drink anything within 2 hours of your scheduled arrival time  In addition, your doctor has ordered for you to drink the provided:  Gatorade G2 Drinking this carbohydrate drink up to two hours before surgery helps to reduce insulin resistance and improve patient outcomes. Please complete drinking 2 hours before scheduled arrival time.  One week prior to surgery Stop Anti-inflammatories (NSAIDS) such as Advil, Aleve, Ibuprofen, Motrin, Naproxen, Naprosyn and Aspirin based products such as Excedrin, Goody's Powder, BC Powder.You may however, continue to take Tylenol if needed for pain up until the day of surgery. Stop ANY OVER THE COUNTER supplements/vitamins NOW (10-18-22) (Calcium-Vitamin D3, B12, Ferrous Sulfate and Preservision)-You may continue your Melatonin up until the night prior to surgery  Call Dr Francesca Oman office today(10-18-22) to see if/when he wants you to stop your 81 mg Aspirin  Stop your JANUMET XR 2 days prior to surgery-Last dose will be on 10-23-22 Monday  Do NOT take any Lantus Insulin the morning of surgery  TAKE ONLY THESE MEDICATIONS THE MORNING OF SURGERY WITH A SIP OF  WATER: -carvedilol (COREG)  -famotidine (PEPCID)  -loratadine (CLARITIN)  -mirabegron ER (MYRBETRIQ)   Use your budesonide-formoterol (SYMBICORT) the morning of surgery  No Alcohol for 24 hours before or after surgery.  No Smoking including e-cigarettes for 24 hours before surgery.  No chewable tobacco products for at least 6 hours before surgery.  No nicotine patches on the day of surgery.  Do not use any "recreational" drugs for at least a week (preferably 2 weeks) before your surgery.  Please be advised that the combination of cocaine and anesthesia may have negative outcomes, up to and including death. If you test positive for cocaine, your surgery will be cancelled.  On the morning of surgery brush your teeth with toothpaste and water, you may rinse your mouth with mouthwash if you wish. Do not swallow any toothpaste or mouthwash.  Do not wear jewelry, make-up, hairpins, clips or nail polish.  Do not wear lotions, powders, or perfumes.   Do not shave body hair from the neck down 48 hours before surgery.  Contact lenses, hearing aids and dentures may not be worn into surgery.  Do not bring valuables to the hospital. [facility] is not responsible for any missing/lost belongings or valuables.   Bring your C-PAP to the hospital   Notify your doctor if there is any change in your medical condition (cold, fever, infection).  Wear comfortable clothing (specific to your surgery type) to the hospital.  After surgery, you can help prevent lung complications by doing breathing exercises.  Take deep breaths and cough every 1-2 hours. Your doctor may order a device called an  Incentive Spirometer to help you take deep breaths. When coughing or sneezing, hold a pillow firmly against your incision with both hands. This is called "splinting." Doing this helps protect your incision. It also decreases belly discomfort.  If you are being admitted to the hospital overnight, leave your  suitcase in the car. After surgery it may be brought to your room.  In case of increased patient census, it may be necessary for you, the patient, to continue your postoperative care in the Same Day Surgery department.  If you are being discharged the day of surgery, you will not be allowed to drive home. You will need a responsible individual to drive you home and stay with you for 24 hours after surgery.   If you are taking public transportation, you will need to have a responsible individual with you.  Please call the Pre-admissions Testing Dept. at [phone] if you have any questions about these instructions.  Surgery Visitation Policy:  Patients having surgery or a procedure may have two visitors.  Children under the age of 49 must have an adult with them who is not the patient.  How to Use an Incentive Spirometer An incentive spirometer is a tool that measures how well you are filling your lungs with each breath. Learning to take long, deep breaths using this tool can help you keep your lungs clear and active. This may help to reverse or lessen your chance of developing breathing (pulmonary) problems, especially infection. You may be asked to use a spirometer: After a surgery. If you have a lung problem or a history of smoking. After a long period of time when you have been unable to move or be active. If the spirometer includes an indicator to show the highest number that you have reached, your health care provider or respiratory therapist will help you set a goal. Keep a log of your progress as told by your health care provider. What are the risks? Breathing too quickly may cause dizziness or cause you to pass out. Take your time so you do not get dizzy or light-headed. If you are in pain, you may need to take pain medicine before doing incentive spirometry. It is harder to take a deep breath if you are having pain. How to use your incentive spirometer  Sit up on the edge of  your bed or on a chair. Hold the incentive spirometer so that it is in an upright position. Before you use the spirometer, breathe out normally. Place the mouthpiece in your mouth. Make sure your lips are closed tightly around it. Breathe in slowly and as deeply as you can through your mouth, causing the piston or the ball to rise toward the top of the chamber. Hold your breath for 3-5 seconds, or for as long as possible. If the spirometer includes a coach indicator, use this to guide you in breathing. Slow down your breathing if the indicator goes above the marked areas. Remove the mouthpiece from your mouth and breathe out normally. The piston or ball will return to the bottom of the chamber. Rest for a few seconds, then repeat the steps 10 or more times. Take your time and take a few normal breaths between deep breaths so that you do not get dizzy or light-headed. Do this every 1-2 hours when you are awake. If the spirometer includes a goal marker to show the highest number you have reached (best effort), use this as a goal to work toward during each  repetition. After each set of 10 deep breaths, cough a few times. This will help to make sure that your lungs are clear. If you have an incision on your chest or abdomen from surgery, place a pillow or a rolled-up towel firmly against the incision when you cough. This can help to reduce pain while taking deep breaths and coughing. General tips When you are able to get out of bed: Walk around often. Continue to take deep breaths and cough in order to clear your lungs. Keep using the incentive spirometer until your health care provider says it is okay to stop using it. If you have been in the hospital, you may be told to keep using the spirometer at home. Contact a health care provider if: You are having difficulty using the spirometer. You have trouble using the spirometer as often as instructed. Your pain medicine is not giving enough relief for  you to use the spirometer as told. You have a fever. Get help right away if: You develop shortness of breath. You develop a cough with bloody mucus from the lungs. You have fluid or blood coming from an incision site after you cough. Summary An incentive spirometer is a tool that can help you learn to take long, deep breaths to keep your lungs clear and active. You may be asked to use a spirometer after a surgery, if you have a lung problem or a history of smoking, or if you have been inactive for a long period of time. Use your incentive spirometer as instructed every 1-2 hours while you are awake. If you have an incision on your chest or abdomen, place a pillow or a rolled-up towel firmly against your incision when you cough. This will help to reduce pain. Get help right away if you have shortness of breath, you cough up bloody mucus, or blood comes from your incision when you cough. This information is not intended to replace advice given to you by your health care provider. Make sure you discuss any questions you have with your health care provider. Document Revised: 08/25/2019 Document Reviewed: 08/25/2019 Elsevier Patient Education  2023 [employer].

## 2022-10-23 ENCOUNTER — Encounter: Payer: Self-pay | Admitting: Urgent Care

## 2022-10-23 ENCOUNTER — Encounter
Admission: RE | Admit: 2022-10-23 | Discharge: 2022-10-23 | Disposition: A | Discharge status: Home / Self Care | Payer: Medicare PPO | Source: Ambulatory Visit | Attending: Obstetrics and Gynecology | Admitting: Obstetrics and Gynecology

## 2022-10-23 DIAGNOSIS — Z01812 Encounter for preprocedural laboratory examination: Secondary | ICD-10-CM | POA: Insufficient documentation

## 2022-10-23 DIAGNOSIS — Z01818 Encounter for other preprocedural examination: Secondary | ICD-10-CM

## 2022-10-23 LAB — BASIC METABOLIC PANEL
Anion gap: 12 (ref 5–15)
BUN: 16 mg/dL (ref 8–23)
CO2: 24 mmol/L (ref 22–32)
Calcium: 9.1 mg/dL (ref 8.9–10.3)
Chloride: 103 mmol/L (ref 98–111)
Creatinine, Ser: 0.84 mg/dL (ref 0.44–1.00)
GFR, Estimated: >60 mL/min (ref >60)
Glucose, Bld: 181 mg/dL — ABNORMAL HIGH (ref 70–99)
Potassium: 3.5 mmol/L (ref 3.5–5.1)
Sodium: 139 mmol/L (ref 135–145)

## 2022-10-23 LAB — CBC
HCT: 41.6 % (ref 36.0–46.0)
Hemoglobin: 13.3 g/dL (ref 12.0–15.0)
MCH: 27.5 pg (ref 26.0–34.0)
MCHC: 32 g/dL (ref 30.0–36.0)
MCV: 86.1 fL (ref 80.0–100.0)
Platelets: 190 10*3/uL (ref 150–400)
RBC: 4.83 MIL/uL (ref 3.87–5.11)
RDW: 13.8 % (ref 11.5–15.5)
WBC: 6.6 10*3/uL (ref 4.0–10.5)
nRBC: 0 % (ref 0.0–0.2)

## 2022-10-23 LAB — TYPE AND SCREEN
ABO/RH(D): O POS
Antibody Screen: NEGATIVE

## 2022-10-23 NOTE — Progress Notes (Incomplete)
Perioperative / Anesthesia Services  Pre-Admission Testing Clinical Review / Preoperative Anesthesia Consult  Date: 10/24/22  Patient Demographics:  Name: Brandi [name] DOB:   1936/06/04 MRN:   [id number]  Planned Surgical Procedure(s):    Case: [id number] Date/Time: 10/26/22 1010   Procedure: FRACTIONAL DILATATION AND CURETTAGE /HYSTEROSCOPY   Anesthesia type: Choice   Pre-op diagnosis: postmenopausal bleeding   Location: [facility] OR ROOM 05 / [facility]S FOR ANESTHESIA GROUP   Surgeons: Schermerhorn, Ihor Austin, MD     NOTE: Available PAT nursing documentation and vital signs have been reviewed. Clinical nursing staff has updated patient's PMH/PSHx, current medication list, and drug allergies/intolerances to ensure comprehensive history available to assist in medical decision making as it pertains to the aforementioned surgical procedure and anticipated anesthetic course. Extensive review of available clinical information personally performed. [place] PMH and PSHx updated with any diagnoses/procedures that  may have been inadvertently omitted during her intake with the pre-admission testing department's nursing staff.  Clinical Discussion:  Brandi [name] is a 87 y.o. female who is submitted for pre-surgical anesthesia review and clearance prior to her undergoing the above procedure. Patient has never been a smoker. Pertinent PMH includes: CAD (s/p CABG), NSTEMI, atrial fibrillation/flutter (s/p LAA closure), NSVT, PSVT, cardiac murmur, unstable angina, aortic atherosclerosis, HTN, HLD, T2DM, dyspnea, OSAH (requires nocturnal PAP therapy), GERD (no daily Tx), anemia, OA, spinal stenosis, anxiety, depression, insomnia.   Patient is followed by cardiology Lady Gary, MD). She was last seen in the cardiology clinic on 09/27/2022; notes reviewed. At the time of her clinic visit, patient doing well overall from a cardiovascular perspective. Patient has chronic lower extremity edema that is reported to be  stable and at baseline. Patient has also experienced several episodes of syncope; workup was negative. Patient denied any chest pain, shortness of breath, PND, orthopnea, palpitations, weakness, fatigue, vertiginous symptoms, or any further episodes of presyncope/syncope. Patient with a past medical history significant for cardiovascular diagnoses. Documented physical exam was grossly benign, providing no evidence of acute exacerbation and/or decompensation of the patient's known cardiovascular conditions.  Patient underwent a diagnostic LEFT heart catheterization on 08/06/2015 revealing multi-vessel CAD; 10% oLM, 70% pLAD-1, 75% pLAD-2, 40% mLAD, and 50% dLAD. PCI was subsequently performed placing a 3.0 x 23 mm Xience V DES x 1 to the pLAD-1 lesion yielding an excellent angiographic result and TIMI-3 flow.   Repeat diagnostic LEFT heart catheterization was performed on 07/03/2011 again revealing multi-vessel CAD; 10% oLM, 10% ISR pLAD, and 50% mLAD. There were minor luminal irregularities noted within the LCx. Given the non-obstructive nature of the patient's noted disease, the decision was made to defer further intervention and opt for medical management.   Patient with previous episodes of syncope. Linq loop recorder was placed on 09/20/2016. No specific cause for patient's episodes of syncope has ever been identified per reports from cardiology. Of note, device has since reached EOL and is no longer functioning/being interrogated.   Patient suffered a NSTEMI on 05/20/2018. Troponins were trended: 0.30 --> 0.25 --> 0.22 --> 0.24 ng/L.   Myocardial perfusion imaging study was performed on 05/21/2018 revealing a normal left ventricular systolic function with an EF of 55-65%. Blood pressure demonstrated a hypertensive response to exercise. Inferior ST segment depression was noted during stress at the 8 minute mark, returning to baseline after 5-9 minutes.    She underwent diagnostic LEFT heart  catheterization on 05/22/2018 revealing multi-vessel CAD; 80% mLCx, 70% OM3, 90% oLAD, 60% pLAD, 60% ostial 1st septal,  90% p-mRCA, and 40% o-pLAD. Given the degree and complexity of the patient's noted disease, patient was referred to CVTS for consultation regarding revascularization.   Patient underwent a 3 vessel revascularization (CABG) procedure on 05/29/2018. LIMA-LAD, SVG-OM1, and SVG-RCA bypass grafts were placed. Of note, patient with a history of concurrent longstanding atrial fibrillation. During her CABG procedure, patient also underwent definitive treatment of her atrial fibrillation diagnosis via closure of the left atrial appendage.   TTE was performed on 02/03/2022 revealing normal left ventricular systolic function with an EF of 55-60%. There were no regional wall motion abnormalities. Mild LVH was observed. Right ventricular size and function was noted to be normal. There was mild mitral valve, in addition to mild-moderate tricuspid valve, regurgitation. All transvalvular gradients were noted to be normal providing no evidence suggestive of valvular stenosis.  Long term cardiac event monitor performed in 01/2022 revealing an underlying sinus rhythm with occasional PVCs. Study showed episodes of NSVT and PSVT with rates as fast as 195 bpm lasting as long as 12 beats. There was no noted evidence of advanced heart block of other malignant tachycardia.    Patient with an atrial fibrillation diagnosis; CHA2DS2-VASc Score = 6 (age x 2, sex, HTN, prior MI, T2DM). Again, patient underwent simultaneous LAA closure during her CABG back in 2019. Currently, rate and rhythm are currently being maintained on oral carvedilol + verapamil. Following LAA closure, patient does not require chronic anticoagulation. Blood pressure elevated at 152/78 mmHg on currently prescribed beta blocker (carvedilol), ARB (losartan), and CCB (verapamil) therapies. She is on a atorvastatin for her HLD diagnosis and further  ASCVD prevention. Patient has a supply of short acting nitrates (NTG) to use on an as needed basis for recurrent angina; denied recent use. T2DM well controlled on currently prescribed regimen; last HgbA1c was 7.0% when checked on 09/12/2022. Functional capacity limited by age and multiple medical comorbidities. However, with that being said, patient still felt to be able to achieve at least 4 METS of physical activity without experiencing any degree of angina/anginal equivalent symptoms. No changes were made to her medication regimen during her visit with cardiology.  Patient scheduled to follow-up with outpatient cardiology in 6 months or sooner if needed.  Brandi [name] is scheduled for a FRACTIONAL DILATATION AND CURETTAGE /HYSTEROSCOPY on 10/26/2022 with Dr. Jennell Corner, MD.  Given patient's past medical history significant for cardiovascular diagnoses, presurgical cardiac clearance was sought by the PAT team.  Per cardiology, "the patient is at the lowest risk possible for perioperative cardiovascular complications with the planned procedure.  The overall risk her procedure is low (<1%).  Currently has no evidence active and/or significant angina and/or congestive heart failure. Patient may proceed to surgery without restriction or need for further cardiovascular testing at an overall LOW risk".  In review of her medication reconciliation, it is noted that patient is currently on prescribed daily antithrombotic therapy. She has been instructed on recommendations for holding her daily low-dose ASA for 7 days prior to her procedure with plans to restart as soon as postoperative bleeding risk felt to be minimized by her attending surgeon. The patient has been instructed that her last dose of her ASA should be on 10/18/2022.  Patient reports previous perioperative complications with anesthesia in the past. Patient has a PMH (+) for PONV. Symptoms and history of PONV will be discussed with patient by  anesthesia team on the day of her procedure. Interventions will be ordered as deemed necessary based on patient's individual  care needs as determined by anesthesiologist. In review of the available records, it is noted that patient underwent a general anesthetic course here at [facility] (ASA III) in 09/2020 without documented complications.      05/04/2022   11:26 AM 04/13/2022    1:16 PM 02/06/2022   11:32 AM  Vitals with BMI  Height 5\' 1"  5\' 1"    Weight 198 lbs 198 lbs   BMI 37.43 37.43   Systolic 139 163 161  Diastolic 79 80 44  Pulse 82 73 73    Providers/Specialists:   NOTE: Primary physician provider listed below. Patient may have been seen by APP or partner within same practice.   PROVIDER ROLE / SPECIALTY LAST Stephannie Li, MD OB/GYN (Surgeon) 09/26/2022  Gracelyn Nurse, MD Primary Care Provider 10/17/2022  Harold Hedge, MD Cardiology 09/27/2022  Wendall Mola, MD Endocrinology 09/19/2022   Allergies:  Ardith Dark Otilio Connors hcl], Micardis [telmisartan], and Quinapril  Current Home Medications:   No current facility-administered medications for this encounter.    acetaminophen (TYLENOL) 500 MG tablet   aspirin 81 MG EC tablet   atorvastatin (LIPITOR) 40 MG tablet   budesonide-formoterol (SYMBICORT) 80-4.5 MCG/ACT inhaler   Calcium Carb-Cholecalciferol (CALCIUM CARBONATE+VITAMIN D PO)   carboxymethylcellulose (REFRESH PLUS) 0.5 % SOLN   carvedilol (COREG) 12.5 MG tablet   Cyanocobalamin (VITAMIN B-12 PO)   famotidine (PEPCID) 20 MG tablet   ferrous sulfate 325 (65 FE) MG tablet   gabapentin (NEURONTIN) 400 MG capsule   glipiZIDE (GLUCOTROL) 5 MG tablet   Insulin Glargine (LANTUS SOLOSTAR) 100 UNIT/ML Solostar Pen   JANUMET XR 50-1000 MG TB24   loratadine (CLARITIN) 10 MG tablet   losartan (COZAAR) 100 MG tablet   Melatonin 10 MG TABS   mirabegron ER (MYRBETRIQ) 50 MG TB24 tablet   Multiple Vitamins-Minerals  (PRESERVISION/LUTEIN) CAPS   nitroGLYCERIN (NITROLINGUAL) 0.4 MG/SPRAY spray   sodium chloride (OCEAN) 0.65 % nasal spray   Verapamil HCl CR 300 MG CP24   History:   Past Medical History:  Diagnosis Date   Acquired cyst of kidney    Anemia    Anxiety    Aortic atherosclerosis (HCC)    Arthritis, degenerative    Atrial fibrillation and flutter (HCC)    a.) CHA2DS2VASc = 6 (age x 2, sex, HTN, prior MI, T2DM);  b.) s/p LAA closure duirng CABG 05/29/2018; c.) rate/rhythm maintained on oral carvedilol + verapamil; no chronic anticoagulation s/p LAA closure   Baden-Walker grade 3 cystocele    Chronic cystitis    Coronary artery disease 08/05/2010   a.) LHC/PCI 08/05/2010: 10% oLM, 70% pLAD-1 (3.0 x 23 mm Xience V DES), 75% pLAD-2, 40% mLAD, 50% dLAD; b.) LHC 07/03/2011: 10% oLM, 10% ISR pLAD, 50% mLAD, LCx with min luminal irregs - med mgmt; c.) LHC 05/22/2018: 80% mLCx, 70% OM3, 90% oLAD, 60% pLAD, 60% ost 1st sep, 90% p-mRCA, 40% o-pLAD --> CVTS; d.) s/p 3v CABG 05/29/2018   Depression    Diabetic neuropathy (HCC)    Dyspnea    GERD (gastroesophageal reflux disease)    Headache    History of left atrial appendage closure 05/29/2018   HLD (hyperlipidemia)    HTN (hypertension)    Implantable loop recorder present 09/20/2016   a.) has reached EOL; no longer funcitoning/being interrogated   Insomnia    a.) uses melatonin PRN   Murmur    NSTEMI (non-ST elevated myocardial infarction) (HCC) 05/20/2018   a.) troponins were trended:  0.30 --> 0.25 --> 0.22 --> 0.24 ng/L; b.) LHC 05/22/2018: 80% mLCx, 70% OM3, 90% oLAD, 60% pLAD, 60% ost 1st sep, 90% p-mRCA, 40% o-pLAD --> CVTS consult; c.) 3v CABG 05/29/2018: LIMA-LAD, SVG-OM1, SVG-RCA   NSVT (nonsustained ventricular tachycardia) (HCC) 01/2022   a.) holter 01/2022: rates as fast as 195 bpm lasting up to 12 beats   Obesity    OSA on CPAP    PONV (postoperative nausea and vomiting)    Presence of drug coated stent in LAD coronary artery     PSVT (paroxysmal supraventricular tachycardia) 01/2022   a.) holter 01/2022: rates as fast as 195 bpm lasting up to 12 beats   S/P CABG x 3 05/29/2018   a.) 3v CABG at [facility] 05/29/2018: LIMA-LAD, SVG-OM1, SVG-RCA   Sepsis (HCC)    Spinal stenosis    Syncope    T2DM (type 2 diabetes mellitus) (HCC)    Unstable angina (HCC)    Urge incontinence of urine    Past Surgical History:  Procedure Laterality Date   BREAST BIOPSY Right 2004   neg   BUNIONECTOMY     CHOLECYSTECTOMY     COLONOSCOPY WITH PROPOFOL N/A 06/14/2017   Procedure: COLONOSCOPY WITH PROPOFOL;  Surgeon: Christena Deem, MD;  Location[facility] ENDOSCOPY;  Service: Endoscopy;  Laterality: N/A;   CORONARY ANGIOPLASTY WITH STENT PLACEMENT Left 08/05/2010   Procedure: CORONARY ANGIOPLASTY WITH STENT PLACEMENT; Location: [facility]; Surgeon(s): Harold Hedge, MD (diagnostic) and Rudean Hitt, MD (interventional)   CORONARY ARTERY BYPASS GRAFT N/A 05/29/2018   Procedure: CORONARY ARTERY BYPASS GRAFT; Location: [facility]; Surgeon: Flint Melter, MD   LEFT ATRIAL APPENDAGE OCCLUSION Left 05/29/2018   Procedure: LEFT ATRIAL APPENDAGE CLOSURE; Location: [facility]; Surgeon: Flint Melter, MD   LEFT HEART CATH AND CORONARY ANGIOGRAPHY N/A 05/22/2018   Procedure: LEFT HEART CATH AND CORONARY ANGIOGRAPHY;  Surgeon: Lamar Blinks, MD;  Location: [facility] INVASIVE CV LAB;  Service: Cardiovascular;  Laterality: N/A;   LEFT HEART CATH AND CORONARY ANGIOGRAPHY Left 07/03/2011   Procedure: LEFT HEART CATH AND CORONARY ANGIOGRAPHY; Location: [facility]; Surgeon: Harold Hedge, MD   LEFT HEART CATH AND CORONARY ANGIOGRAPHY Left 05/22/2018   Procedure: LEFT HEART CATH AND CORONARY ANGIOGRAPHY; Location: [facility]; Surgeon: Harold Hedge, MD   LOOP RECORDER INSERTION N/A 09/20/2016   Procedure: Loop Recorder Insertion;  Surgeon: Marcina Millard, MD;  Location: [facility] INVASIVE CV LAB;  Service: Cardiovascular;  Laterality: N/A;   SEPTOPLASTY     TOTAL KNEE ARTHROPLASTY  Bilateral    XI ROBOTIC ASSISTED INGUINAL HERNIA REPAIR WITH MESH Left 10/14/2020   Procedure: XI ROBOTIC ASSISTED INGUINAL HERNIA REPAIR WITH MESH;  Surgeon: Campbell Lerner, MD;  Location: [facility] ORS;  Service: General;  Laterality: Left;   Family History  Problem Relation Age of Onset   Breast cancer Cousin    Skin cancer Other    Diabetes Other    Hypercholesterolemia Other    Hypertension Other    Bladder Cancer Maternal Aunt    COPD Other    Kidney disease Neg Hx    Kidney cancer Neg Hx    Social History   Tobacco Use   Smoking status: Never   Smokeless tobacco: Never  Vaping Use   Vaping Use: Never used  Substance Use Topics   Alcohol use: No   Drug use: No    Pertinent Clinical Results:  LABS:   Hospital Outpatient Visit on 10/23/2022  Component Date Value Ref Range Status   WBC 10/23/2022 6.6  4.0 - 10.5  K/uL Final   RBC 10/23/2022 4.83  3.87 - 5.11 MIL/uL Final   Hemoglobin 10/23/2022 13.3  12.0 - 15.0 g/dL Final   HCT 16/03/9603 41.6  36.0 - 46.0 % Final   MCV 10/23/2022 86.1  80.0 - 100.0 fL Final   MCH 10/23/2022 27.5  26.0 - 34.0 pg Final   MCHC 10/23/2022 32.0  30.0 - 36.0 g/dL Final   RDW 54/02/8118 13.8  11.5 - 15.5 % Final   Platelets 10/23/2022 190  150 - 400 K/uL Final   nRBC 10/23/2022 0.0  0.0 - 0.2 % Final   Performed at [facility], [address] [place], [place] [address]   ABO/RH(D) 10/23/2022 O POS   Final   Antibody Screen 10/23/2022 NEG   Final   Sample Expiration 10/23/2022 11/06/2022,2359   Final   Extend sample reason 10/23/2022    Final                   Value:NO TRANSFUSIONS OR PREGNANCY IN THE PAST 3 MONTHS Performed at [facility], [address] [place], [place] [address]    Sodium 10/23/2022 139  135 - 145 mmol/L Final   Potassium 10/23/2022 3.5  3.5 - 5.1 mmol/L Final   Chloride 10/23/2022 103  98 - 111 mmol/L Final   CO2 10/23/2022 24  22 - 32 mmol/L Final   Glucose, Bld 10/23/2022 181 (H)  70 - 99  mg/dL Final   Glucose reference range applies only to samples taken after fasting for at least 8 hours.   BUN 10/23/2022 16  8 - 23 mg/dL Final   Creatinine, Ser 10/23/2022 0.84  0.44 - 1.00 mg/dL Final   Calcium 30/86/5784 9.1  8.9 - 10.3 mg/dL Final   GFR, Estimated 10/23/2022 >60  >60 mL/min Final   Comment: (NOTE) Calculated using the CKD-EPI Creatinine Equation (2021)    Anion gap 10/23/2022 12  5 - 15 Final   Performed at [facility], [address] [place], [place] [address]    ECG: Date: 09/27/2022 Time ECG obtained: 0956 AM Rate: 70 bpm Rhythm: normal sinus Axis (leads I and aVF): Normal Intervals: PR 148 ms. QRS 84 ms. QTc 442 ms. ST segment and T wave changes: No evidence of acute ST segment elevation or depression Comparison: Similar to previous tracing obtained on 02/02/2022   IMAGING / PROCEDURES: CT ABDOMEN PELVIS W CONTRAST performed on 08/03/2022 No acute intra-abdominal or pelvic pathology. Normal appendix. Constipation. No bowel obstruction. Thickened endometrium. Further evaluation with pelvic ultrasound recommended Aortic atherosclerosis  TRANSTHORACIC ECHOCARDIOGRAM performed on 02/03/2022 Left ventricular ejection fraction, by estimation, is 55 to 60%. The left ventricle has normal function. The left ventricle has no regional wall motion abnormalities. There is mild left ventricular hypertrophy. Left ventricular diastolic parameters were normal.  Right ventricular systolic function is normal. The right ventricular size is normal.  The mitral valve is normal in structure. Mild mitral valve regurgitation. No evidence of mitral stenosis.  Tricuspid valve regurgitation is mild to moderate.  The aortic valve is normal in structure. Aortic valve regurgitation is not visualized. No aortic stenosis is present.  The inferior vena cava is normal in size with greater than 50% respiratory variability, suggesting right atrial pressure of 3 mmHg.   CORONARY  ARTERY BYPASS GRAFTING performed on 05/29/2018 Three-vessel revascularization (CABG) procedure LIMA-LAD SVG-OM1 SVG-RCA  LEFT HEART CATHETERIZATION AND CORONARY ANGIOGRAPHY performed on 05/22/2018 Normal left ventricular systolic function with EF 55-60% Multivessel CAD 80% mid  LCx 70% ostial third marginal 90% ostial LAD 60% proximal LAD with 60% stenosis side branch of ostial first septal 90% proximal to mid RCA 40% ostial to proximal LAD Plan/recommendations Critical three-vessel coronary artery disease with in-stent restenosis most amenable to surgery CVTS consult regarding revascularization Continue heparin, aspirin, beta-blocker, and ACE inhibitor for NSTEMI Cardiac rehabilitation after coronary bypass surgery   MYOCARDIAL PERFUSION IMAGING STUDY (LEXISCAN) performed on 05/21/2018 Normal left ventricular systolic function with an EF of 55 to 65% But demonstrated a hypertensive response to exercise Horizontal ST segment depression noted during stress and leads II, III, and aVF.  ECG changes began at 8 minutes of stress and returned to baseline after 5 to 9 minutes of recovery. Findings consistent with ischemia High risk study  Impression and Plan:  Brandi [name] has been referred for pre-anesthesia review and clearance prior to her undergoing the planned anesthetic and procedural courses. Available labs, pertinent testing, and imaging results were personally reviewed by me in preparation for upcoming operative/procedural course. [facility] medical record has been updated following extensive record review and patient interview with PAT staff.   This patient has been appropriately cleared by cardiology with an overall LOW risk of significant perioperative cardiovascular complications. Based on clinical review performed today (10/24/22), barring any significant acute changes in the patient's overall condition, it is anticipated that she will be able to proceed with the planned  surgical intervention. Any acute changes in clinical condition may necessitate her procedure being postponed and/or cancelled. Patient will meet with anesthesia team (MD and/or CRNA) on the day of her procedure for preoperative evaluation/assessment. Questions regarding anesthetic course will be fielded at that time.   Pre-surgical instructions were reviewed with the patient during her PAT appointment, and questions were fielded to satisfaction by PAT clinical staff. She has been instructed on which medications that she will need to hold prior to surgery, as well as the ones that have been deemed safe/appropriate to take on the day of her procedure. As part of the general education provided by PAT, patient made aware both verbally and in writing, that she would need to abstain from the use of any illegal substances during her perioperative course.  She was advised that failure to follow the provided instructions could necessitate case cancellation or result in serious perioperative complications up to and including death. Patient encouraged to contact PAT and/or her surgeon's office to discuss any questions or concerns that may arise prior to surgery; verbalized understanding.   Quentin Mulling, MSN, APRN, FNP-C, CEN [facility]  Peri-operative Services Nurse Practitioner Phone: [phone] Fax: [phone] 10/24/22 9:32 AM  NOTE: This note has been prepared using Dragon dictation software. Despite my best ability to proofread, there is always the potential that unintentional transcriptional errors may still occur from this process.

## 2022-10-24 ENCOUNTER — Other Ambulatory Visit: Payer: Self-pay

## 2022-10-24 ENCOUNTER — Emergency Department
Admission: EM | Admit: 2022-10-24 | Discharge: 2022-10-24 | Disposition: A | Discharge status: Home / Self Care | Payer: Medicare PPO | Attending: Emergency Medicine | Admitting: Emergency Medicine

## 2022-10-24 ENCOUNTER — Encounter: Payer: Self-pay | Admitting: Obstetrics and Gynecology

## 2022-10-24 DIAGNOSIS — I129 Hypertensive chronic kidney disease with stage 1 through stage 4 chronic kidney disease, or unspecified chronic kidney disease: Secondary | ICD-10-CM | POA: Insufficient documentation

## 2022-10-24 DIAGNOSIS — E1165 Type 2 diabetes mellitus with hyperglycemia: Secondary | ICD-10-CM | POA: Diagnosis not present

## 2022-10-24 DIAGNOSIS — I10 Essential (primary) hypertension: Secondary | ICD-10-CM

## 2022-10-24 DIAGNOSIS — N183 Chronic kidney disease, stage 3 unspecified: Secondary | ICD-10-CM | POA: Insufficient documentation

## 2022-10-24 DIAGNOSIS — E1122 Type 2 diabetes mellitus with diabetic chronic kidney disease: Secondary | ICD-10-CM | POA: Diagnosis not present

## 2022-10-24 DIAGNOSIS — R55 Syncope and collapse: Secondary | ICD-10-CM | POA: Diagnosis present

## 2022-10-24 DIAGNOSIS — Z951 Presence of aortocoronary bypass graft: Secondary | ICD-10-CM | POA: Diagnosis not present

## 2022-10-24 DIAGNOSIS — I2510 Atherosclerotic heart disease of native coronary artery without angina pectoris: Secondary | ICD-10-CM | POA: Diagnosis not present

## 2022-10-24 LAB — BASIC METABOLIC PANEL
Anion gap: 16 — ABNORMAL HIGH (ref 5–15)
BUN: 13 mg/dL (ref 8–23)
CO2: 22 mmol/L (ref 22–32)
Calcium: 9.2 mg/dL (ref 8.9–10.3)
Chloride: 101 mmol/L (ref 98–111)
Creatinine, Ser: 0.99 mg/dL (ref 0.44–1.00)
GFR, Estimated: 56 mL/min — ABNORMAL LOW (ref >60)
Glucose, Bld: 234 mg/dL — ABNORMAL HIGH (ref 70–99)
Potassium: 3.6 mmol/L (ref 3.5–5.1)
Sodium: 139 mmol/L (ref 135–145)

## 2022-10-24 LAB — CBC
HCT: 41.6 % (ref 36.0–46.0)
Hemoglobin: 13.3 g/dL (ref 12.0–15.0)
MCH: 27.7 pg (ref 26.0–34.0)
MCHC: 32 g/dL (ref 30.0–36.0)
MCV: 86.7 fL (ref 80.0–100.0)
Platelets: 191 10*3/uL (ref 150–400)
RBC: 4.8 MIL/uL (ref 3.87–5.11)
RDW: 13.7 % (ref 11.5–15.5)
WBC: 6.1 10*3/uL (ref 4.0–10.5)
nRBC: 0 % (ref 0.0–0.2)

## 2022-10-24 LAB — BLOOD GAS, VENOUS
Acid-Base Excess: 8.2 mmol/L — ABNORMAL HIGH (ref 0.0–2.0)
Bicarbonate: 34.4 mmol/L — ABNORMAL HIGH (ref 20.0–28.0)
O2 Saturation: 55.2 %
Patient temperature: 37
pCO2, Ven: 53 mmHg (ref 44–60)
pH, Ven: 7.42 (ref 7.25–7.43)
pO2, Ven: 31 mmHg — CL (ref 32–45)

## 2022-10-24 LAB — LACTIC ACID, PLASMA: Lactic Acid, Venous: 1.8 mmol/L (ref 0.5–1.9)

## 2022-10-24 LAB — TROPONIN I (HIGH SENSITIVITY): Troponin I (High Sensitivity): 7 ng/L (ref <18)

## 2022-10-24 LAB — BETA-HYDROXYBUTYRIC ACID: Beta-Hydroxybutyric Acid: 0.23 mmol/L (ref 0.05–0.27)

## 2022-10-24 MED ORDER — CARVEDILOL 6.25 MG PO TABS
12.5000 mg | ORAL_TABLET | Freq: Once | ORAL | Status: AC
  Administered 2022-10-24: 12.5 mg via ORAL
  Filled 2022-10-24: qty 2

## 2022-10-24 MED ORDER — LOSARTAN POTASSIUM 50 MG PO TABS
100.0000 mg | ORAL_TABLET | Freq: Once | ORAL | Status: AC
  Administered 2022-10-24: 100 mg via ORAL
  Filled 2022-10-24: qty 2

## 2022-10-24 MED ORDER — LABETALOL HCL 5 MG/ML IV SOLN
10.0000 mg | Freq: Once | INTRAVENOUS | Status: AC
  Administered 2022-10-24: 10 mg via INTRAVENOUS
  Filled 2022-10-24: qty 4

## 2022-10-24 MED ORDER — LACTATED RINGERS IV BOLUS
1000.0000 mL | Freq: Once | INTRAVENOUS | Status: AC
  Administered 2022-10-24: 1000 mL via INTRAVENOUS

## 2022-10-24 NOTE — Discharge Instructions (Addendum)
Your blood pressure was significantly elevated today.  Please check your blood pressure tomorrow when you are sitting down and relaxed.  Please continue to take your blood pressure medications as prescribed.  If it is consistently elevated at home you will likely need your medications adjusted which she can discuss with your cardiologist or primary care doctor.  Please follow-up with your cardiologist in regard to your passing out episode today in about 1 week.  If you develop any new symptoms such as headache chest pain or shortness of breath and please return to the emergency department.

## 2022-10-24 NOTE — ED Provider Notes (Signed)
[facility] Provider Note    Event Date/Time   First MD Initiated Contact with Patient 10/24/22 1616     (approximate)   History   Near Syncope   HPI  Brandi [name] is a 87 y.o. female with past medical history of anemia, coronary artery disease status post CABG diabetes, paroxysmal A-fib, GERD, hypertension, OSA, coronary artery disease who presents with syncope.  Patient was having a bowel movement.  Says it was normal bowel meant she did not have to strain.  Her husband then brought her the fall because she had a phone call and says that she was speaking very slightly and then stopped talking and appeared to lose consciousness.  She did not actually fall to the ground or lose postural tone.  States that her breathing was irregular during this episode.  She then came back to.  Denies any palpitations chest pain or dyspnea.  Denies abdominal pain nausea vomiting diarrhea or urinary symptoms.  She is scheduled to have a procedure on her endometrium this Thursday in 2 days.    Past Medical History:  Diagnosis Date   Acquired cyst of kidney    Anemia    Anxiety    Aortic atherosclerosis (HCC)    Arthritis, degenerative    Atrial fibrillation and flutter (HCC)    a.) CHA2DS2VASc = 6 (age x 2, sex, HTN, prior MI, T2DM);  b.) s/p LAA closure duirng CABG 05/29/2018; c.) rate/rhythm maintained on oral carvedilol + verapamil; no chronic anticoagulation s/p LAA closure   Baden-Walker grade 3 cystocele    Chronic cystitis    Coronary artery disease 08/05/2010   a.) LHC/PCI 08/05/2010: 10% oLM, 70% pLAD-1 (3.0 x 23 mm Xience V DES), 75% pLAD-2, 40% mLAD, 50% dLAD; b.) LHC 07/03/2011: 10% oLM, 10% ISR pLAD, 50% mLAD, LCx with min luminal irregs - med mgmt; c.) LHC 05/22/2018: 80% mLCx, 70% OM3, 90% oLAD, 60% pLAD, 60% ost 1st sep, 90% p-mRCA, 40% o-pLAD --> CVTS; d.) s/p 3v CABG 05/29/2018   Depression    Diabetic neuropathy (HCC)    Dyspnea    GERD (gastroesophageal  reflux disease)    Headache    History of left atrial appendage closure 05/29/2018   HLD (hyperlipidemia)    HTN (hypertension)    Implantable loop recorder present 09/20/2016   a.) has reached EOL; no longer funcitoning/being interrogated   Insomnia    a.) uses melatonin PRN   Murmur    NSTEMI (non-ST elevated myocardial infarction) (HCC) 05/20/2018   a.) troponins were trended: 0.30 --> 0.25 --> 0.22 --> 0.24 ng/L; b.) LHC 05/22/2018: 80% mLCx, 70% OM3, 90% oLAD, 60% pLAD, 60% ost 1st sep, 90% p-mRCA, 40% o-pLAD --> CVTS consult; c.) 3v CABG 05/29/2018: LIMA-LAD, SVG-OM1, SVG-RCA   NSVT (nonsustained ventricular tachycardia) (HCC) 01/2022   a.) holter 01/2022: rates as fast as 195 bpm lasting up to 12 beats   Obesity    OSA on CPAP    PONV (postoperative nausea and vomiting)    Presence of drug coated stent in LAD coronary artery    PSVT (paroxysmal supraventricular tachycardia) 01/2022   a.) holter 01/2022: rates as fast as 195 bpm lasting up to 12 beats   S/P CABG x 3 05/29/2018   a.) 3v CABG at [facility] 05/29/2018: LIMA-LAD, SVG-OM1, SVG-RCA   Sepsis (HCC)    Spinal stenosis    Syncope    T2DM (type 2 diabetes mellitus) (HCC)    Unstable angina (HCC)  Urge incontinence of urine     Patient Active Problem List   Diagnosis Date Noted   HTN (hypertension), malignant    Anemia of chronic disease 02/05/2022   Obesity (BMI 30-39.9) 02/03/2022   Status post laparoscopic hernia repair 10/28/2020   AKI (acute kidney injury) (HCC) 04/13/2020   Acute hyponatremia 04/13/2020   Dehydration 04/13/2020   Diarrhea 04/13/2020   Abdominal pain 04/13/2020   Syncope 03/28/2020   CAD (coronary artery disease) 03/28/2020   Chronic kidney disease, stage III (moderate) (HCC) 02/12/2019   Background diabetic retinopathy associated with type 2 diabetes mellitus (HCC) 12/06/2018   Decreased activities of daily living (ADL) 06/05/2018   Decreased strength, endurance, and mobility 06/05/2018    S/P CABG x 3 06/04/2018   Presence of drug coated stent in LAD coronary artery 05/27/2018   Anxiety 05/25/2018   Atrial fibrillation and flutter (HCC) 05/25/2018   H/O gastroesophageal reflux (GERD) 05/25/2018   Hyperlipidemia 05/25/2018   Unstable angina (HCC) 05/21/2018   Chest pain 05/20/2018   Sacroiliitis (HCC) 02/21/2018   Positional lightheadedness 03/14/2017   Diabetes mellitus type 2, insulin dependent (HCC) 03/14/2017   Action tremor 09/11/2016   Headache disorder 09/11/2016   Severe sepsis (HCC) 04/14/2016   Orthostatic hypotension 11/10/2015   Diabetic neuropathy (HCC) 10/06/2015   Urinary tract infection without hematuria 09/13/2015   Cystocele, grade 3 09/13/2015   Incontinence 09/13/2015   Depression, major, in remission (HCC) 12/23/2014   Pernicious anemia 12/23/2014   Coronary arteriosclerosis 01/06/2014   Sleep apnea in adult 01/06/2014   Benign essential hypertension 12/01/2013   Obesity, unspecified 12/01/2013   Acquired cyst of kidney 05/02/2013   Chronic cystitis 03/11/2012   Urge incontinence 03/11/2012     Physical Exam  Triage Vital Signs: ED Triage Vitals  Enc Vitals Group     BP 10/24/22 1315 (!) 140/73     Pulse Rate 10/24/22 1315 73     Resp 10/24/22 1315 16     Temp 10/24/22 1315 97.7 F (36.5 C)     Temp Source 10/24/22 1315 Oral     SpO2 10/24/22 1315 96 %     Weight --      Height --      Head Circumference --      Peak Flow --      Pain Score 10/24/22 1305 0     Pain Loc --      Pain Edu? --      Excl. in GC? --     Most recent vital signs: Vitals:   10/24/22 2042 10/24/22 2045  BP: (!) 229/123   Pulse:  77  Resp:  (!) 22  Temp:    SpO2:  100%     General: Awake, no distress.  CV:  Good peripheral perfusion.  Resp:  Normal effort.  Abd:  No distention.  Abdomen soft nontender throughout no peripheral edema or asymmetry Neuro:             Awake, Alert, Oriented x 3  Other:     ED Results / Procedures / Treatments   Labs (all labs ordered are listed, but only abnormal results are displayed) Labs Reviewed  BASIC METABOLIC PANEL - Abnormal; Notable for the following components:      Result Value   Glucose, Bld 234 (*)    GFR, Estimated 56 (*)    Anion gap 16 (*)    All other components within normal limits  BLOOD GAS, VENOUS - Abnormal; Notable for  the following components:   pO2, Ven 31 (*)    Bicarbonate 34.4 (*)    Acid-Base Excess 8.2 (*)    All other components within normal limits  CBC  BETA-HYDROXYBUTYRIC ACID  LACTIC ACID, PLASMA  URINALYSIS, ROUTINE W REFLEX MICROSCOPIC  LACTIC ACID, PLASMA  CBG MONITORING, ED  TROPONIN I (HIGH SENSITIVITY)     EKG  EKG reviewed interpreted myself shows sinus rhythm with questionable secondary AV block, dropped P waves normal axis and intervals diffuse T wave flattening   RADIOLOGY    PROCEDURES:  Critical Care performed: Yes, see critical care procedure note(s)  Procedures  The patient is on the cardiac monitor to evaluate for evidence of arrhythmia and/or significant heart rate changes.   MEDICATIONS ORDERED IN ED: Medications  lactated ringers bolus 1,000 mL (0 mLs Intravenous Stopped 10/24/22 1848)  losartan (COZAAR) tablet 100 mg (100 mg Oral Given 10/24/22 1853)  carvedilol (COREG) tablet 12.5 mg (12.5 mg Oral Given 10/24/22 1853)  labetalol (NORMODYNE) injection 10 mg (10 mg Intravenous Given 10/24/22 2027)     IMPRESSION / MDM / ASSESSMENT AND PLAN / ED COURSE  I reviewed the triage vital signs and the nursing notes.                              Patient's presentation is most consistent with acute presentation with potential threat to life or bodily function.  Differential diagnosis includes, but is not limited to, vasovagal syncope, arrhythmia, heart block, structural heart disease  Patient is a 87 year old female with significant cardiac history and prior syncopal episodes who presents after syncopal episode.  She was on the  toilet had normal bowel movement and then became lightheaded and lost consciousness.  Did not fall or hit her head.  She had no preceding palpitations dyspnea or chest pain.  Currently she is asymptomatic.  Initial vital signs are reassuring with blood pressure in the 140s.  However throughout ED stay her blood pressure did rise to the 260s over 100s.  Patient is been compliant with her blood pressure medication.  On exam she looks well no signs of volume overload abdominal exam is benign she did not have any injury relating to the syncopal episode.  Patient's initial EKG was concerning to me that it looked like there was possible 2-1 AV block.  Looks to be extra P wave after each T wave.  I did discuss this with Dr. Darrold Junker with cardiology who reviewed the EKG and did not feel that this was AV block and that it was more artifact.  Repeat EKG looks like just sinus rhythm. Patient's labs are notable for hyperglycemia with a glucose of 230 normal bicarb but elevated anion gap of 16.  Checked beta hydroxybutyrate and lactate these are reassuring.  Patient was incontinent and unable to find urine sample.  CBC reassuring no leukocytosis or anemia.  On reassessment patient is feeling fine has no complaints but blood pressure is significantly elevated.  We did check a manual and on both arms.  Technically she is asymptomatic but with a significant elevated blood pressure and syncopal episodes think she should likely be observed in the hospital.  Will give dose of labetalol.  BP downtrending to 220s over 120s.  Patient continues to be asymptomatic.  Discussed observation in the hospital versus discharge.  Patient prefers to be discharged.  She is returning to the hospital for procedure on Thursday.  Recommended she check  her blood pressure tomorrow morning as well.  Discussed tricked return precautions for symptoms of endorgan damage including headache confusion chest pain etc.  Patient will continue to take her  blood pressure medications at home.        FINAL CLINICAL IMPRESSION(S) / ED DIAGNOSES   Final diagnoses:  Syncope, unspecified syncope type  Hypertension, unspecified type     Rx / DC Orders   ED Discharge Orders     None        Note:  This document was prepared using Dragon voice recognition software and may include unintentional dictation errors.   Georga Hacking, MD 10/24/22 2141

## 2022-10-24 NOTE — ED Notes (Signed)
Pts manual BP taken in both arms. 260/128 in left arm. This RN checked BP in both arms, changed cuff, and retook pressure. All above 250/120's.

## 2022-10-24 NOTE — ED Triage Notes (Signed)
Pt comes via EMs from home with c/o near syncopal. Pt was in bathroom when this happened.  CBG-238 20 lac  Pt states she just feels washed out.

## 2022-10-24 NOTE — ED Notes (Signed)
Lab called to add on trop 

## 2022-10-26 ENCOUNTER — Ambulatory Visit
Admission: RE | Admit: 2022-10-26 | Payer: Medicare PPO | Source: Ambulatory Visit | Admitting: Obstetrics and Gynecology

## 2022-10-26 DIAGNOSIS — Z01818 Encounter for other preprocedural examination: Secondary | ICD-10-CM

## 2022-10-26 HISTORY — DX: Atherosclerosis of aorta: I70.0

## 2022-10-26 HISTORY — DX: Insomnia, unspecified: G47.00

## 2022-10-26 HISTORY — DX: Unspecified atrial fibrillation: I48.91

## 2022-10-26 HISTORY — DX: Type 2 diabetes mellitus without complications: E11.9

## 2022-10-26 HISTORY — DX: Spinal stenosis, site unspecified: M48.00

## 2022-10-26 SURGERY — DILATATION AND CURETTAGE /HYSTEROSCOPY
Anesthesia: Choice

## 2022-11-27 NOTE — H&P (Signed)
Brandi [name] is a 87 y.o. female here for fx D+C due to PMB   . Thickened Endometrium on CT scan    Pt still with PMB   TVUS  :    Uterus: Anteverted   Endometrium= fluid-filled, Total w/fluid= 13.34mm, Endo= 1.12mm   Rt ovary not seen Left ovary not seen No free fluid seen     Past Medical History:  has a past medical history of Anemia, unspecified, ASCVD (arteriosclerotic cardiovascular disease), Coronary arteriosclerosis due to lipid rich plaque (01/06/2014), Coronary artery disease, Essential hypertension, benign, Hemorrhage of rectum and anus, Left wrist fracture (04/2016), Obesity, On continuous heparin infusion (05/27/2018), Osteoarthritis, Other and unspecified hyperlipidemia, Pelvic fracture (CMS-HCC), Pernicious anemia, Presence of drug coated stent in LAD coronary artery (05/27/2018), PUD (peptic ulcer disease), Recurrent UTI, Right ulnar fracture (07/2021), Spinal stenosis, and Type 2 diabetes mellitus (CMS-HCC).  Past Surgical History:  has a past surgical history that includes Cholecystectomy; Bunionectomy (Right); Repair Nasal Septal Perforations; ercp (1998); Knee arthroscopy (Right, 2004); Knee arthroscopy (Left, 09/2008); Cardiac catheterization; Coronary angioplasty; Colonoscopy (06/14/2017); coronary stent (2012); coronary artery bypass w/single artery graft (N/A, 05/29/2018); coronary artery bypass w/vein only (N/A, 05/29/2018); application wound vac (N/A, 05/29/2018); and Coronary artery bypass graft. Family History: family history includes Alzheimer's disease in her mother; Autism in her maternal aunt; Myocardial Infarction (Heart attack) in her mother; No Known Problems in her brother, daughter, father, maternal grandfather, maternal grandmother, maternal uncle, paternal aunt, paternal grandfather, paternal grandmother, paternal uncle, sister, and son. Social History:  reports that she has never smoked. She has never used smokeless tobacco. She reports that she does not drink  alcohol and does not use drugs. OB/GYN History:  OB History       Gravida  4   Para  3   Term  3   Preterm      AB      Living  3        SAB      IAB      Ectopic      Molar      Multiple      Live Births  3             Allergies: is allergic to accupril [quinapril] and micardis [telmisartan]. Medications:  Current Medications    Current Outpatient Medications:    acetaminophen (TYLENOL) 500 MG tablet, Take 1,000 mg by mouth 2 (two) times daily   , Disp: , Rfl:    aspirin 81 MG EC tablet, Take 81 mg by mouth once daily., Disp: , Rfl:    atorvastatin (LIPITOR) 40 MG tablet, TAKE 1 TABLET(40 MG) BY MOUTH EVERY NIGHT, Disp: 90 tablet, Rfl: 1   blood glucose diagnostic (ACCU-CHEK AVIVA PLUS TEST STRP) test strip, 3 (three) times daily Use as instructed to test blood sugar, Disp: 200 strip, Rfl: 4   calcium carbonate-vitamin D3 (CALTRATE 600+D) 600 mg-10 mcg (400 unit) tablet, Take 1 tablet by mouth 2 (two) times daily with meals, Disp: 180 tablet, Rfl: 0   carboxymethylcellulose (REFRESH TEARS) 0.5 % ophthalmic solution, Place 1 drop into both eyes nightly, Disp: , Rfl:    carvediloL (COREG) 12.5 MG tablet, Take 1 tablet (12.5 mg total) by mouth 2 (two) times daily with meals, Disp: 180 tablet, Rfl: 1   cyanocobalamin (VITAMIN B12) 1000 MCG tablet, Take 1,000 mcg by mouth once daily  , Disp: , Rfl:    famotidine (PEPCID) 20 MG tablet, Take 20 mg by mouth 2 (  two) times daily, Disp: , Rfl:    ferrous sulfate 325 (65 FE) MG tablet, Take 325 mg by mouth once daily, Disp: , Rfl:    fluticasone propionate (FLONASE) 50 mcg/actuation nasal spray, Place 1 spray into both nostrils 2 (two) times daily, Disp: 16 g, Rfl: 0   gabapentin (NEURONTIN) 400 MG capsule, TAKE 1 CAPSULE BY MOUTH TWICE DAILY FOR TREMORS OR NERVE PAIN, Disp: 180 capsule, Rfl: 0   glipiZIDE (GLUCOTROL) 5 MG tablet, TAKE 1 TABLET(5 MG) BY MOUTH EVERY MORNING BEFORE BREAKFAST, Disp: 90 tablet, Rfl: 1   insulin  GLARGINE (LANTUS SOLOSTAR U-100 INSULIN) pen injector (concentration 100 units/mL), INJECT 12 UNITS UNDER THE SKIN ONCE DAILY, Disp: 15 mL, Rfl: 3   JANUMET XR 50-1,000 mg ER 24 hr mutliphase tablet, TAKE 1 TABLET BY MOUTH TWICE DAILY, Disp: 180 tablet, Rfl: 3   lancets (ACCU-CHEK SOFTCLIX LANCETS), USE AS DIRECTED TWICE DAILY, Disp: 200 each, Rfl: 3   loratadine (CLARITIN) 10 mg tablet, Take 10 mg by mouth once daily, Disp: , Rfl:    losartan (COZAAR) 100 MG tablet, TAKE 1 TABLET(100 MG) BY MOUTH EVERY NIGHT, Disp: 90 tablet, Rfl: 3   melatonin 10 mg Tab, Take 1 tablet by mouth nightly, Disp: , Rfl:    mirabegron (MYRBETRIQ) 50 mg ER tablet, Take 50 mg by mouth once daily, Disp: , Rfl:    multivitamin with minerals, EYE, (PRESERVISION AREDS 2) soft gel capsule, Take 1 capsule by mouth 2 (two) times daily with meals   , Disp: , Rfl:    nystatin (MYCOSTATIN) 100,000 unit/gram powder, Apply topically 2 (two) times daily, Disp: 30 g, Rfl: 1   SYMBICORT 80-4.5 mcg/actuation inhaler, Inhale 2 inhalations into the lungs 2 (two) times daily, Disp: 10.2 g, Rfl: 2   triamcinolone 0.5 % cream, Apply topically 2 (two) times daily, Disp: 30 g, Rfl: 1   verapamiL (VERELAN PM) 300 mg ER capsule, TAKE 1 CAPSULE(300 MG) BY MOUTH AT BEDTIME, Disp: 90 capsule, Rfl: 0   nitroGLYcerin (NITROSTAT) 0.4 MG SL tablet, Place 1 tablet (0.4 mg total) under the tongue every 5 (five) minutes as needed for Chest pain May take up to 3 doses., Disp: 25 tablet, Rfl: 11     Review of Systems: General:                      No fatigue or weight loss Eyes:                           No vision changes Ears:                            No hearing difficulty Respiratory:                No cough or shortness of breath Pulmonary:                  No asthma or shortness of breath Cardiovascular:           No chest pain, palpitations, dyspnea on exertion Gastrointestinal:          No abdominal bloating, chronic diarrhea, constipations,  masses, pain or hematochezia Genitourinary:             No hematuria, dysuria, abnormal vaginal discharge, pelvic pain, Menometrorrhagia, + PMB  Lymphatic:  No swollen lymph nodes Musculoskeletal:No muscle weakness Neurologic:                  No extremity weakness, syncope, seizure disorder Psychiatric:                  No history of depression, delusions or suicidal/homicidal ideation      Exam:       Vitals:  11/27/22   BP: 115/76  Pulse: 76      Body mass index is 38.73 kg/m.   WDWN white/  female in NAD   Lungs: CTA  CV : RRR without murmur   Breast: exam done in sitting and lying position : No dimpling or retraction, no dominant mass, no spontaneous discharge, no axillary adenopathy Neck:  no thyromegaly Abdomen: soft , no mass, normal active bowel sounds,  non-tender, no rebound tenderness Pelvic: tanner stage 5 ,  External genitalia: vulva /labia no lesions Urethra: no prolapse Vagina: + stenosis. No blood noted  Cervix:  pin point external os no lesions, no cervical motion tenderness   Uterus: normal size shape and contour, non-tender Adnexa: no mass,  non-tender   Rectovaginal:    Impression:    The primary encounter diagnosis was Thickened endometrium. A diagnosis of PMB (postmenopausal bleeding) was also pertinent to this visit.       Plan:    Given bleeding eventhough the cumulative separate stripes added together are normal she is still bleeding . R/o EIN    Recommend Fx D+C and H/S . Technically may be difficult due to her vaginal and cervical stenosis   Preop clearance from Cardiology done - cleared for surgery ( Dr Juliann Pares)  Vilma Prader, MD

## 2022-12-01 ENCOUNTER — Other Ambulatory Visit: Payer: Self-pay | Admitting: Obstetrics and Gynecology

## 2022-12-01 DIAGNOSIS — Z1231 Encounter for screening mammogram for malignant neoplasm of breast: Secondary | ICD-10-CM

## 2022-12-07 ENCOUNTER — Encounter
Admission: RE | Admit: 2022-12-07 | Discharge: 2022-12-07 | Disposition: A | Discharge status: Home / Self Care | Payer: Medicare PPO | Source: Ambulatory Visit | Attending: Obstetrics and Gynecology | Admitting: Obstetrics and Gynecology

## 2022-12-07 DIAGNOSIS — Z01812 Encounter for preprocedural laboratory examination: Secondary | ICD-10-CM

## 2022-12-07 NOTE — Pre-Procedure Instructions (Signed)
PAT follow up completed with patient, allergies reviewed, medications updated and instructions reviewed, cardiac clearance completed per Dr. Juliann Pares, patient informed to continue her asa 81 mg , but will hold the day of surgery.

## 2022-12-13 ENCOUNTER — Ambulatory Visit
Admission: RE | Admit: 2022-12-13 | Discharge: 2022-12-13 | Disposition: A | Discharge status: Home / Self Care | Payer: Medicare PPO | Source: Ambulatory Visit | Attending: Obstetrics and Gynecology | Admitting: Obstetrics and Gynecology

## 2022-12-13 DIAGNOSIS — Z1231 Encounter for screening mammogram for malignant neoplasm of breast: Secondary | ICD-10-CM | POA: Diagnosis present

## 2022-12-14 ENCOUNTER — Encounter: Payer: Self-pay | Admitting: Obstetrics and Gynecology

## 2022-12-14 ENCOUNTER — Emergency Department
Admission: EM | Admit: 2022-12-14 | Discharge: 2022-12-14 | Discharge status: Against Medical Advice | Payer: Medicare PPO | Source: Home / Self Care

## 2022-12-14 ENCOUNTER — Ambulatory Visit
Admission: RE | Admit: 2022-12-14 | Discharge: 2022-12-14 | Disposition: A | Discharge status: Home / Self Care | Payer: Medicare PPO | Source: Ambulatory Visit | Attending: Obstetrics and Gynecology | Admitting: Obstetrics and Gynecology

## 2022-12-14 ENCOUNTER — Ambulatory Visit: Payer: Medicare PPO | Admitting: Urgent Care

## 2022-12-14 ENCOUNTER — Other Ambulatory Visit: Payer: Self-pay

## 2022-12-14 ENCOUNTER — Encounter
Admission: RE | Disposition: A | Discharge status: Home / Self Care | Payer: Self-pay | Source: Ambulatory Visit | Attending: Obstetrics and Gynecology

## 2022-12-14 DIAGNOSIS — R110 Nausea: Secondary | ICD-10-CM | POA: Insufficient documentation

## 2022-12-14 DIAGNOSIS — F32A Depression, unspecified: Secondary | ICD-10-CM | POA: Insufficient documentation

## 2022-12-14 DIAGNOSIS — I700 Atherosclerosis of aorta: Secondary | ICD-10-CM | POA: Diagnosis not present

## 2022-12-14 DIAGNOSIS — Z7951 Long term (current) use of inhaled steroids: Secondary | ICD-10-CM | POA: Insufficient documentation

## 2022-12-14 DIAGNOSIS — Z79899 Other long term (current) drug therapy: Secondary | ICD-10-CM | POA: Insufficient documentation

## 2022-12-14 DIAGNOSIS — N950 Postmenopausal bleeding: Secondary | ICD-10-CM | POA: Insufficient documentation

## 2022-12-14 DIAGNOSIS — Z955 Presence of coronary angioplasty implant and graft: Secondary | ICD-10-CM | POA: Insufficient documentation

## 2022-12-14 DIAGNOSIS — N882 Stricture and stenosis of cervix uteri: Secondary | ICD-10-CM | POA: Diagnosis not present

## 2022-12-14 DIAGNOSIS — Z794 Long term (current) use of insulin: Secondary | ICD-10-CM | POA: Diagnosis not present

## 2022-12-14 DIAGNOSIS — Z5321 Procedure and treatment not carried out due to patient leaving prior to being seen by health care provider: Secondary | ICD-10-CM | POA: Insufficient documentation

## 2022-12-14 DIAGNOSIS — N72 Inflammatory disease of cervix uteri: Secondary | ICD-10-CM | POA: Insufficient documentation

## 2022-12-14 DIAGNOSIS — I4891 Unspecified atrial fibrillation: Secondary | ICD-10-CM | POA: Diagnosis not present

## 2022-12-14 DIAGNOSIS — Z01812 Encounter for preprocedural laboratory examination: Secondary | ICD-10-CM

## 2022-12-14 DIAGNOSIS — Z7984 Long term (current) use of oral hypoglycemic drugs: Secondary | ICD-10-CM | POA: Diagnosis not present

## 2022-12-14 DIAGNOSIS — I2510 Atherosclerotic heart disease of native coronary artery without angina pectoris: Secondary | ICD-10-CM | POA: Insufficient documentation

## 2022-12-14 DIAGNOSIS — Z01818 Encounter for other preprocedural examination: Secondary | ICD-10-CM

## 2022-12-14 DIAGNOSIS — F419 Anxiety disorder, unspecified: Secondary | ICD-10-CM | POA: Diagnosis not present

## 2022-12-14 DIAGNOSIS — R6883 Chills (without fever): Secondary | ICD-10-CM | POA: Insufficient documentation

## 2022-12-14 DIAGNOSIS — R519 Headache, unspecified: Secondary | ICD-10-CM | POA: Insufficient documentation

## 2022-12-14 DIAGNOSIS — G4733 Obstructive sleep apnea (adult) (pediatric): Secondary | ICD-10-CM | POA: Diagnosis not present

## 2022-12-14 DIAGNOSIS — E785 Hyperlipidemia, unspecified: Secondary | ICD-10-CM | POA: Insufficient documentation

## 2022-12-14 DIAGNOSIS — Z951 Presence of aortocoronary bypass graft: Secondary | ICD-10-CM | POA: Diagnosis not present

## 2022-12-14 DIAGNOSIS — E669 Obesity, unspecified: Secondary | ICD-10-CM | POA: Insufficient documentation

## 2022-12-14 DIAGNOSIS — Z6838 Body mass index (BMI) 38.0-38.9, adult: Secondary | ICD-10-CM | POA: Insufficient documentation

## 2022-12-14 DIAGNOSIS — E119 Type 2 diabetes mellitus without complications: Secondary | ICD-10-CM | POA: Diagnosis not present

## 2022-12-14 DIAGNOSIS — I10 Essential (primary) hypertension: Secondary | ICD-10-CM | POA: Diagnosis not present

## 2022-12-14 DIAGNOSIS — Z7982 Long term (current) use of aspirin: Secondary | ICD-10-CM | POA: Diagnosis not present

## 2022-12-14 HISTORY — PX: HYSTEROSCOPY WITH D & C: SHX1775

## 2022-12-14 LAB — CBC
HCT: 44.5 % (ref 36.0–46.0)
HCT: 41.9 % (ref 36.0–46.0)
Hemoglobin: 14.2 g/dL (ref 12.0–15.0)
Hemoglobin: 13.5 g/dL (ref 12.0–15.0)
MCH: 27.3 pg (ref 26.0–34.0)
MCH: 27.2 pg (ref 26.0–34.0)
MCHC: 31.9 g/dL (ref 30.0–36.0)
MCHC: 32.2 g/dL (ref 30.0–36.0)
MCV: 85.4 fL (ref 80.0–100.0)
MCV: 84.5 fL (ref 80.0–100.0)
Platelets: 211 10*3/uL (ref 150–400)
Platelets: 213 10*3/uL (ref 150–400)
RBC: 5.21 MIL/uL — ABNORMAL HIGH (ref 3.87–5.11)
RBC: 4.96 MIL/uL (ref 3.87–5.11)
RDW: 14 % (ref 11.5–15.5)
RDW: 14 % (ref 11.5–15.5)
WBC: 6.2 10*3/uL (ref 4.0–10.5)
WBC: 11.9 10*3/uL — ABNORMAL HIGH (ref 4.0–10.5)
nRBC: 0 % (ref 0.0–0.2)
nRBC: 0 % (ref 0.0–0.2)

## 2022-12-14 LAB — TYPE AND SCREEN
ABO/RH(D): O POS
Antibody Screen: NEGATIVE

## 2022-12-14 LAB — GLUCOSE, CAPILLARY
Glucose-Capillary: 157 mg/dL — ABNORMAL HIGH (ref 70–99)
Glucose-Capillary: 170 mg/dL — ABNORMAL HIGH (ref 70–99)

## 2022-12-14 LAB — BASIC METABOLIC PANEL
Anion gap: 7 (ref 5–15)
Anion gap: 14 (ref 5–15)
BUN: 11 mg/dL (ref 8–23)
BUN: 12 mg/dL (ref 8–23)
CO2: 27 mmol/L (ref 22–32)
CO2: 20 mmol/L — ABNORMAL LOW (ref 22–32)
Calcium: 9 mg/dL (ref 8.9–10.3)
Calcium: 8.7 mg/dL — ABNORMAL LOW (ref 8.9–10.3)
Chloride: 103 mmol/L (ref 98–111)
Chloride: 99 mmol/L (ref 98–111)
Creatinine, Ser: 0.73 mg/dL (ref 0.44–1.00)
Creatinine, Ser: 0.77 mg/dL (ref 0.44–1.00)
GFR, Estimated: >60 mL/min (ref >60)
GFR, Estimated: >60 mL/min (ref >60)
Glucose, Bld: 166 mg/dL — ABNORMAL HIGH (ref 70–99)
Glucose, Bld: 266 mg/dL — ABNORMAL HIGH (ref 70–99)
Potassium: 3.7 mmol/L (ref 3.5–5.1)
Potassium: 3.6 mmol/L (ref 3.5–5.1)
Sodium: 137 mmol/L (ref 135–145)
Sodium: 133 mmol/L — ABNORMAL LOW (ref 135–145)

## 2022-12-14 SURGERY — DILATATION AND CURETTAGE /HYSTEROSCOPY
Anesthesia: General | Site: Vagina

## 2022-12-14 MED ORDER — ACETAMINOPHEN 500 MG PO TABS
ORAL_TABLET | ORAL | Status: AC
  Filled 2022-12-14: qty 2

## 2022-12-14 MED ORDER — OXYCODONE HCL 5 MG PO TABS
5.0000 mg | ORAL_TABLET | Freq: Once | ORAL | Status: AC | PRN
  Administered 2022-12-14: 5 mg via ORAL

## 2022-12-14 MED ORDER — PROPOFOL 500 MG/50ML IV EMUL
INTRAVENOUS | Status: DC | PRN
  Administered 2022-12-14: 100 ug/kg/min via INTRAVENOUS

## 2022-12-14 MED ORDER — MIDAZOLAM HCL 2 MG/2ML IJ SOLN
INTRAMUSCULAR | Status: DC | PRN
  Administered 2022-12-14: 1 mg via INTRAVENOUS

## 2022-12-14 MED ORDER — CEFAZOLIN SODIUM-DEXTROSE 2-4 GM/100ML-% IV SOLN
2.0000 g | Freq: Once | INTRAVENOUS | Status: AC
  Administered 2022-12-14: 2 g via INTRAVENOUS

## 2022-12-14 MED ORDER — OXYCODONE HCL 5 MG PO TABS
ORAL_TABLET | ORAL | Status: AC
  Filled 2022-12-14: qty 1

## 2022-12-14 MED ORDER — PROPOFOL 10 MG/ML IV BOLUS
INTRAVENOUS | Status: AC
  Filled 2022-12-14: qty 20

## 2022-12-14 MED ORDER — EPHEDRINE SULFATE (PRESSORS) 50 MG/ML IJ SOLN
INTRAMUSCULAR | Status: DC | PRN
  Administered 2022-12-14: 10 mg via INTRAVENOUS
  Administered 2022-12-14: 5 mg via INTRAVENOUS

## 2022-12-14 MED ORDER — KETAMINE HCL 10 MG/ML IJ SOLN
INTRAMUSCULAR | Status: DC | PRN
  Administered 2022-12-14: 30 mg via INTRAVENOUS

## 2022-12-14 MED ORDER — LIDOCAINE HCL (PF) 2 % IJ SOLN
INTRAMUSCULAR | Status: AC
  Filled 2022-12-14: qty 5

## 2022-12-14 MED ORDER — HYDRALAZINE HCL 20 MG/ML IJ SOLN
INTRAMUSCULAR | Status: DC | PRN
  Administered 2022-12-14: 10 mg via INTRAVENOUS

## 2022-12-14 MED ORDER — PHENYLEPHRINE HCL (PRESSORS) 10 MG/ML IV SOLN
INTRAVENOUS | Status: DC | PRN
  Administered 2022-12-14 (×2): 80 ug via INTRAVENOUS
  Administered 2022-12-14: 160 ug via INTRAVENOUS

## 2022-12-14 MED ORDER — MIDAZOLAM HCL 2 MG/2ML IJ SOLN
INTRAMUSCULAR | Status: AC
  Filled 2022-12-14: qty 2

## 2022-12-14 MED ORDER — 0.9 % SODIUM CHLORIDE (POUR BTL) OPTIME
TOPICAL | Status: DC | PRN
  Administered 2022-12-14: 500 mL

## 2022-12-14 MED ORDER — SODIUM CHLORIDE 0.9 % IV SOLN: INTRAVENOUS | Status: DC

## 2022-12-14 MED ORDER — CHLORHEXIDINE GLUCONATE 0.12 % MT SOLN
OROMUCOSAL | Status: AC
  Filled 2022-12-14: qty 15

## 2022-12-14 MED ORDER — CEFAZOLIN SODIUM-DEXTROSE 2-4 GM/100ML-% IV SOLN
INTRAVENOUS | Status: AC
  Filled 2022-12-14: qty 100

## 2022-12-14 MED ORDER — KETAMINE HCL 50 MG/5ML IJ SOSY
PREFILLED_SYRINGE | INTRAMUSCULAR | Status: AC
  Filled 2022-12-14: qty 5

## 2022-12-14 MED ORDER — ORAL CARE MOUTH RINSE: 15.0000 mL | Freq: Once | OROMUCOSAL | Status: AC

## 2022-12-14 MED ORDER — GABAPENTIN 300 MG PO CAPS
300.0000 mg | ORAL_CAPSULE | ORAL | Status: AC
  Administered 2022-12-14: 300 mg via ORAL

## 2022-12-14 MED ORDER — HYDRALAZINE HCL 20 MG/ML IJ SOLN
INTRAMUSCULAR | Status: AC
  Filled 2022-12-14: qty 1

## 2022-12-14 MED ORDER — ONDANSETRON HCL 4 MG/2ML IJ SOLN: 4.0000 mg | Freq: Once | INTRAMUSCULAR | Status: DC | PRN

## 2022-12-14 MED ORDER — EPHEDRINE 5 MG/ML INJ
INTRAVENOUS | Status: AC
  Filled 2022-12-14: qty 5

## 2022-12-14 MED ORDER — PHENYLEPHRINE 80 MCG/ML (10ML) SYRINGE FOR IV PUSH (FOR BLOOD PRESSURE SUPPORT)
PREFILLED_SYRINGE | INTRAVENOUS | Status: AC
  Filled 2022-12-14: qty 10

## 2022-12-14 MED ORDER — LABETALOL HCL 5 MG/ML IV SOLN
INTRAVENOUS | Status: AC
  Filled 2022-12-14: qty 4

## 2022-12-14 MED ORDER — LACTATED RINGERS IV SOLN: INTRAVENOUS | Status: DC

## 2022-12-14 MED ORDER — ACETAMINOPHEN 500 MG PO TABS
1000.0000 mg | ORAL_TABLET | ORAL | Status: AC
  Administered 2022-12-14: 1000 mg via ORAL

## 2022-12-14 MED ORDER — FENTANYL CITRATE (PF) 100 MCG/2ML IJ SOLN
INTRAMUSCULAR | Status: AC
  Filled 2022-12-14: qty 2

## 2022-12-14 MED ORDER — GABAPENTIN 300 MG PO CAPS
ORAL_CAPSULE | ORAL | Status: AC
  Filled 2022-12-14: qty 1

## 2022-12-14 MED ORDER — KETOROLAC TROMETHAMINE 30 MG/ML IJ SOLN
INTRAMUSCULAR | Status: DC | PRN
  Administered 2022-12-14: 15 mg via INTRAVENOUS

## 2022-12-14 MED ORDER — POVIDONE-IODINE 10 % EX SWAB
2.0000 | Freq: Once | CUTANEOUS | Status: AC
  Administered 2022-12-14: 2 via TOPICAL

## 2022-12-14 MED ORDER — FENTANYL CITRATE (PF) 100 MCG/2ML IJ SOLN: 25.0000 ug | INTRAMUSCULAR | Status: DC | PRN

## 2022-12-14 MED ORDER — FENTANYL CITRATE (PF) 100 MCG/2ML IJ SOLN
INTRAMUSCULAR | Status: DC | PRN
  Administered 2022-12-14 (×2): 25 ug via INTRAVENOUS

## 2022-12-14 MED ORDER — DEXMEDETOMIDINE HCL IN NACL 80 MCG/20ML IV SOLN
INTRAVENOUS | Status: DC | PRN
  Administered 2022-12-14: 16 ug via INTRAVENOUS

## 2022-12-14 MED ORDER — LABETALOL HCL 5 MG/ML IV SOLN
INTRAVENOUS | Status: DC | PRN
  Administered 2022-12-14: 10 mg via INTRAVENOUS
  Administered 2022-12-14 (×2): 5 mg via INTRAVENOUS

## 2022-12-14 MED ORDER — OXYCODONE HCL 5 MG/5ML PO SOLN: 5.0000 mg | Freq: Once | ORAL | Status: AC | PRN

## 2022-12-14 MED ORDER — CHLORHEXIDINE GLUCONATE 0.12 % MT SOLN
15.0000 mL | Freq: Once | OROMUCOSAL | Status: AC
  Administered 2022-12-14: 15 mL via OROMUCOSAL

## 2022-12-14 MED ORDER — KETOROLAC TROMETHAMINE 30 MG/ML IJ SOLN
INTRAMUSCULAR | Status: AC
  Filled 2022-12-14: qty 1

## 2022-12-14 SURGICAL SUPPLY — 26 items
BAG PRESSURE INF REUSE 1000 (BAG) ×1 IMPLANT
DEVICE MYOSURE LITE (MISCELLANEOUS) IMPLANT
DEVICE MYOSURE REACH (MISCELLANEOUS) IMPLANT
DRSG TELFA 3X8 NADH STRL (GAUZE/BANDAGES/DRESSINGS) ×1 IMPLANT
GLOVE SURG SYN 8.0 (GLOVE) ×1 IMPLANT
GLOVE SURG SYN 8.0 PF PI (GLOVE) ×1 IMPLANT
GOWN STRL REUS W/ TWL LRG LVL3 (GOWN DISPOSABLE) ×1 IMPLANT
GOWN STRL REUS W/ TWL XL LVL3 (GOWN DISPOSABLE) ×1 IMPLANT
GOWN STRL REUS W/TWL LRG LVL3 (GOWN DISPOSABLE) ×1
GOWN STRL REUS W/TWL XL LVL3 (GOWN DISPOSABLE) ×1
IV NS 1000ML (IV SOLUTION) ×1
IV NS 1000ML BAXH (IV SOLUTION) ×1 IMPLANT
IV NS IRRIG 3000ML ARTHROMATIC (IV SOLUTION) ×1 IMPLANT
KIT PROCEDURE FLUENT (KITS) IMPLANT
KIT TURNOVER CYSTO (KITS) ×1 IMPLANT
MANIFOLD NEPTUNE II (INSTRUMENTS) ×1 IMPLANT
PACK DNC HYST (MISCELLANEOUS) IMPLANT
PAD OB MATERNITY 4.3X12.25 (PERSONAL CARE ITEMS) ×1 IMPLANT
PAD PREP OB/GYN DISP 24X41 (PERSONAL CARE ITEMS) ×1 IMPLANT
SCRUB CHG 4% DYNA-HEX 4OZ (MISCELLANEOUS) ×1 IMPLANT
SEAL ROD LENS SCOPE MYOSURE (ABLATOR) ×1 IMPLANT
SET CYSTO W/LG BORE CLAMP LF (SET/KITS/TRAYS/PACK) IMPLANT
TOWEL OR 17X26 4PK STRL BLUE (TOWEL DISPOSABLE) ×1 IMPLANT
TRAP FLUID SMOKE EVACUATOR (MISCELLANEOUS) ×1 IMPLANT
TUBING CONNECTING 10 (TUBING) IMPLANT
WATER STERILE IRR 500ML POUR (IV SOLUTION) ×1 IMPLANT

## 2022-12-14 NOTE — Brief Op Note (Signed)
12/14/2022  11:19 AM  PATIENT:  Brandi [name]  87 y.o. female  PRE-OPERATIVE DIAGNOSIS:  postmenopausal bleeding  POST-OPERATIVE DIAGNOSIS: cervical stenosis, atrophic endometrium   PROCEDURE:  Procedure(s): FRACTIONAL DILATATION AND CURETTAGE /HYSTEROSCOPY (N/A)  SURGEON:  Surgeon(s) and Role:    * Lenox Bink, Ihor Austin, MD - Primary  PHYSICIAN ASSISTANT: cst  ASSISTANTS: none   ANESTHESIA:   general  EBL:  5 mL IOF 600 cc uo 500 cc  BLOOD ADMINISTERED:none  DRAINS: none   LOCAL MEDICATIONS USED:  NONE  SPECIMEN:  Source of Specimen:  ecc , endometrial curettings  DISPOSITION OF SPECIMEN:  PATHOLOGY  COUNTS:  YES  TOURNIQUET:  * No tourniquets in log *  DICTATION: .Other Dictation: Dictation Number verbal  PLAN OF CARE: Discharge to home after PACU  PATIENT DISPOSITION:  PACU - hemodynamically stable.   Delay start of Pharmacological VTE agent (>24hrs) due to surgical blood loss or risk of bleeding: not applicable

## 2022-12-14 NOTE — Progress Notes (Signed)
PMB and thickened endometrial stripe  Scheduled for FX D+C and h/s  Labs reviewed all questions answered . Proceed

## 2022-12-14 NOTE — Discharge Instructions (Signed)

## 2022-12-14 NOTE — Anesthesia Preprocedure Evaluation (Signed)
Anesthesia Evaluation  Patient identified by MRN, date of birth, ID band Patient awake  General Assessment Comment: SBO from incarcerated hernia  Reviewed: Allergy & Precautions, NPO status , Patient's Chart, lab work & pertinent test results  History of Anesthesia Complications (+) PONV and history of anesthetic complications  Airway Mallampati: III  TM Distance: >3 FB Neck ROM: Full    Dental  (+) Teeth Intact, Caps   Pulmonary sleep apnea and Continuous Positive Airway Pressure Ventilation , neg COPD, Patient abstained from smoking.Not current smoker Takes inhaler for unknown reason, morning and night   Pulmonary exam normal breath sounds clear to auscultation       Cardiovascular Exercise Tolerance: Poor METShypertension, + CAD, + Past MI, + Cardiac Stents and + CABG  + dysrhythmias Atrial Fibrillation + Valvular Problems/Murmurs  Rhythm:Regular Rate:Normal - Systolic murmurs S/p cabg 2019. Cardiology deemed patient low-risk of complications for today's procedure.  TTE was performed on 02/03/2022 revealing normal left ventricular systolic function with an EF of 55-60%. There were no regional wall motion abnormalities. Mild LVH was observed. Right ventricular size and function was noted to be normal. There was mild mitral valve, in addition to mild-moderate tricuspid valve, regurgitation. All transvalvular gradients were noted to be normal providing no evidence suggestive of valvular stenosis.    Neuro/Psych  Headaches PSYCHIATRIC DISORDERS Anxiety Depression       GI/Hepatic ,neg GERD  ,,(+)     (-) substance abuse    Endo/Other  diabetes    Renal/GU Renal InsufficiencyRenal disease     Musculoskeletal   Abdominal  (+) + obese  Peds  Hematology   Anesthesia Other Findings Past Medical History: No date: Anemia No date: Anxiety No date: Arthritis, degenerative No date: Chronic cystitis No date: Depression No  date: Diabetes mellitus without complication (HCC) No date: GERD (gastroesophageal reflux disease) No date: Headache No date: Heart disease No date: HLD (hyperlipidemia) No date: HTN (hypertension) No date: Hypertension No date: Incontinence in female No date: Murmur No date: Obesity No date: Sleep apnea No date: Urge incontinence of urine   Reproductive/Obstetrics                             Anesthesia Physical Anesthesia Plan  ASA: 3  Anesthesia Plan: General   Post-op Pain Management: Tylenol PO (pre-op)* and Gabapentin PO (pre-op)*   Induction: Intravenous  PONV Risk Score and Plan: 4 or greater and Propofol infusion, TIVA, Ondansetron and Treatment may vary due to age or medical condition  Airway Management Planned: Nasal Cannula and Natural Airway  Additional Equipment: None  Intra-op Plan:   Post-operative Plan:   Informed Consent: I have reviewed the patients History and Physical, chart, labs and discussed the procedure including the risks, benefits and alternatives for the proposed anesthesia with the patient or authorized representative who has indicated his/her understanding and acceptance.     Dental advisory given  Plan Discussed with: CRNA and Surgeon  Anesthesia Plan Comments: (Discussed risks of anesthesia with patient, including possibility of difficulty with spontaneous ventilation under anesthesia necessitating airway intervention, PONV, and rare risks such as cardiac or respiratory or neurological events, and allergic reactions. Discussed the role of CRNA in patient's perioperative care. Patient understands.)        Anesthesia Quick Evaluation

## 2022-12-14 NOTE — Anesthesia Postprocedure Evaluation (Signed)
Anesthesia Post Note  Patient: Brandi [name]  Procedure(s) Performed: FRACTIONAL DILATATION AND CURETTAGE /HYSTEROSCOPY (Vagina )  Patient location during evaluation: PACU Anesthesia Type: General Level of consciousness: awake and alert Pain management: pain level controlled Vital Signs Assessment: post-procedure vital signs reviewed and stable Respiratory status: spontaneous breathing, nonlabored ventilation, respiratory function stable and patient connected to nasal cannula oxygen Cardiovascular status: blood pressure returned to baseline and stable Postop Assessment: no apparent nausea or vomiting Anesthetic complications: no   No notable events documented.   Last Vitals:  Vitals:   12/14/22 1155 12/14/22 1223  BP: (!) 117/59 116/60  Pulse: 66 68  Resp: 18 16  Temp:  (!) 36.3 C  SpO2: 98% 98%    Last Pain:  Vitals:   12/14/22 1223  TempSrc: Temporal  PainSc:                  [name]

## 2022-12-14 NOTE — Transfer of Care (Signed)
Immediate Anesthesia Transfer of Care Note  Patient: Brandi [name]  Procedure(s) Performed: FRACTIONAL DILATATION AND CURETTAGE /HYSTEROSCOPY (Vagina )  Patient Location: PACU  Anesthesia Type:General  Level of Consciousness: awake, alert , and oriented  Airway & Oxygen Therapy: Patient Spontanous Breathing  Post-op Assessment: Report given to RN and Post -op Vital signs reviewed and stable  Post vital signs: Reviewed and stable  Last Vitals:  Vitals Value Taken Time  BP 140/58 12/14/22 1133  Temp 37.4 C 12/14/22 1133  Pulse 73 12/14/22 1133  Resp 20 12/14/22 1133  SpO2 100 % 12/14/22 1133    Last Pain:  Vitals:   12/14/22 1133  TempSrc:   PainSc: 0-No pain         Complications: No notable events documented.

## 2022-12-14 NOTE — ED Triage Notes (Signed)
Pt to ED for nausea after having D&C today. Also reports chills. +h/a

## 2022-12-14 NOTE — Op Note (Signed)
NAME: Brandi, [name] MEDICAL RECORD NO: [id number] ACCOUNT NO: [id number] DATE OF BIRTH: [birth year] FACILITY: [facility] LOCATION: [facility] PHYSICIAN: Suzy Bouchard, MD  Operative Report   PREOPERATIVE DIAGNOSES:   1.  Postmenopausal bleeding. 2.  Thickened endometrial stripe.  POSTOPERATIVE DIAGNOSES:   1.  Cervical stenosis. 2.  Atrophic endometrium.  PROCEDURE:  1.  Fractional dilation and curettage. 2.  Hysteroscopy.  SURGEON:  Suzy Bouchard, MD  ANESTHESIA:  MAC.  INDICATIONS:  An 87 year old gravida 4, para 3 patient with several month history of postmenopausal bleeding.  CAT scan showed endometrial stripe to be thickened.  Vaginal ultrasound showed a fluid-filled endometrial cavity.  On clinical exam, patient  has a pinpoint cervical external os.  DESCRIPTION OF PROCEDURE:  After adequate MAC anesthesia, the patient was placed in dorsal supine position with legs in the candy cane stirrups.  The patient's perineum and vagina were prepped and draped in normal sterile fashion.  Timeout was performed.   The patient did receive 2 grams Ancef prior to commencement of the procedure.  A speculum was placed in the vagina and bladder was straight catheterized, yielding 500 mL clear urine.  Speculum was placed in the vagina and the cervix was extremely flush  with the vaginal apex.  A pinpoint cervical os was identified.  Ultimately, a single tooth tenaculum was grasped on the anterior cervical stroma followed by placement of a second single tooth tenaculum on the posterior cervical stroma.  Lacrimal probe  dilators were used to open the pinpoint cervical os to the point then a #9 Hanks cervical dilators could then be placed without difficulty.  Cervix was dilated to a 12 Hanks dilator and then an endocervical curettage was performed.  Cervix was then  followed on with dilation to #18 Hanks dilator.  The 5 mm hysteroscope was then advanced into the endometrial cavity with a  large amount of old discharge being flushed out from the uterus.  The cavity appeared atrophic with no polyps, no lesions  identified.  Hysteroscope was removed after taking pictures and the cervix was then dilated to a #20 Hanks dilator and sharp curettage was performed with scant tissue being removed.  Single tooth tenaculums were removed.  Silver nitrate applied to the  tenaculum site for hemostasis.  Good hemostasis noted.  There were no complications.  ESTIMATED BLOOD LOSS:  Minimal.  INTRAOPERATIVE FLUIDS:  600 mL  URINE OUTPUT:  500 mL  The patient did receive 15 mg of Toradol intravenous at the end of the case and was taken to recovery room in good condition.      PAA D: 12/14/2022 11:44:34 am T: 12/14/2022 12:03:00 pm  JOB: [id number]/ [id number]

## 2022-12-15 ENCOUNTER — Encounter: Payer: Self-pay | Admitting: Obstetrics and Gynecology

## 2023-01-15 ENCOUNTER — Other Ambulatory Visit: Payer: Self-pay | Admitting: Specialist

## 2023-01-15 DIAGNOSIS — R0602 Shortness of breath: Secondary | ICD-10-CM

## 2023-01-15 DIAGNOSIS — J986 Disorders of diaphragm: Secondary | ICD-10-CM

## 2023-01-17 ENCOUNTER — Ambulatory Visit
Admission: RE | Admit: 2023-01-17 | Discharge: 2023-01-17 | Disposition: A | Discharge status: Home / Self Care | Payer: Medicare PPO | Source: Ambulatory Visit | Attending: Specialist | Admitting: Specialist

## 2023-01-17 DIAGNOSIS — J986 Disorders of diaphragm: Secondary | ICD-10-CM | POA: Insufficient documentation

## 2023-01-17 DIAGNOSIS — R0602 Shortness of breath: Secondary | ICD-10-CM | POA: Insufficient documentation

## 2023-02-22 ENCOUNTER — Emergency Department: Payer: Medicare PPO

## 2023-02-22 ENCOUNTER — Encounter: Payer: Self-pay | Admitting: Emergency Medicine

## 2023-02-22 ENCOUNTER — Other Ambulatory Visit: Payer: Self-pay

## 2023-02-22 ENCOUNTER — Emergency Department
Admission: EM | Admit: 2023-02-22 | Discharge: 2023-02-22 | Disposition: A | Discharge status: Home / Self Care | Payer: Medicare PPO | Attending: Emergency Medicine | Admitting: Emergency Medicine

## 2023-02-22 DIAGNOSIS — N390 Urinary tract infection, site not specified: Secondary | ICD-10-CM | POA: Diagnosis not present

## 2023-02-22 DIAGNOSIS — R1031 Right lower quadrant pain: Secondary | ICD-10-CM | POA: Diagnosis present

## 2023-02-22 LAB — CBC
HCT: 43.2 % (ref 36.0–46.0)
Hemoglobin: 13.7 g/dL (ref 12.0–15.0)
MCH: 27.5 pg (ref 26.0–34.0)
MCHC: 31.7 g/dL (ref 30.0–36.0)
MCV: 86.6 fL (ref 80.0–100.0)
Platelets: 214 10*3/uL (ref 150–400)
RBC: 4.99 MIL/uL (ref 3.87–5.11)
RDW: 14.8 % (ref 11.5–15.5)
WBC: 7.6 10*3/uL (ref 4.0–10.5)
nRBC: 0 % (ref 0.0–0.2)

## 2023-02-22 LAB — COMPREHENSIVE METABOLIC PANEL
ALT: 17 U/L (ref 0–44)
AST: 21 U/L (ref 15–41)
Albumin: 3.6 g/dL (ref 3.5–5.0)
Alkaline Phosphatase: 66 U/L (ref 38–126)
Anion gap: 11 (ref 5–15)
BUN: 14 mg/dL (ref 8–23)
CO2: 25 mmol/L (ref 22–32)
Calcium: 8.8 mg/dL — ABNORMAL LOW (ref 8.9–10.3)
Chloride: 100 mmol/L (ref 98–111)
Creatinine, Ser: 0.83 mg/dL (ref 0.44–1.00)
GFR, Estimated: >60 mL/min (ref >60)
Glucose, Bld: 114 mg/dL — ABNORMAL HIGH (ref 70–99)
Potassium: 3.7 mmol/L (ref 3.5–5.1)
Sodium: 136 mmol/L (ref 135–145)
Total Bilirubin: 0.2 mg/dL — ABNORMAL LOW (ref 0.3–1.2)
Total Protein: 6.9 g/dL (ref 6.5–8.1)

## 2023-02-22 LAB — URINALYSIS, ROUTINE W REFLEX MICROSCOPIC
Bilirubin Urine: NEGATIVE
Glucose, UA: NEGATIVE mg/dL
Hgb urine dipstick: NEGATIVE
Ketones, ur: NEGATIVE mg/dL
Nitrite: NEGATIVE
Protein, ur: 30 mg/dL — AB
Specific Gravity, Urine: 1.02 (ref 1.005–1.030)
WBC, UA: >50 WBC/hpf (ref 0–5)
pH: 5 (ref 5.0–8.0)

## 2023-02-22 LAB — LIPASE, BLOOD: Lipase: 25 U/L (ref 11–51)

## 2023-02-22 MED ORDER — CEPHALEXIN 500 MG PO CAPS: 500.0000 mg | ORAL_CAPSULE | Freq: Four times a day (QID) | ORAL | 0 refills | Status: AC

## 2023-02-22 MED ORDER — SODIUM CHLORIDE 0.9 % IV SOLN
1.0000 g | Freq: Once | INTRAVENOUS | Status: AC
  Administered 2023-02-22: 1 g via INTRAVENOUS
  Filled 2023-02-22: qty 10

## 2023-02-22 MED ORDER — IOHEXOL 300 MG/ML  SOLN
100.0000 mL | Freq: Once | INTRAMUSCULAR | Status: AC | PRN
  Administered 2023-02-22: 100 mL via INTRAVENOUS

## 2023-02-22 NOTE — ED Provider Notes (Signed)
[facility] Provider Note    Event Date/Time   First MD Initiated Contact with Patient 02/22/23 1505     (approximate)   History   Abdominal Pain   HPI  Brandi [name] is a 87 y.o. female  \presents to the emergency department today because of concerns for abdominal pain.  Located in the right lower quadrant.  She has been having pain discomfort there for the past few months.  She has not noticed any pattern to the pain.  Yesterday however the pain was more severe.  It also felt like it radiated across her lower abdomen.  Patient had an appointment with her pulmonologist today and when at that appointment had a couple episodes of vomiting.  Today the pain is not as severe as it was yesterday.  She denies any change in urination.     Physical Exam   Triage Vital Signs: ED Triage Vitals [02/22/23 1455]  Encounter Vitals Group     BP (!) 178/87     Systolic BP Percentile      Diastolic BP Percentile      Pulse Rate 74     Resp 18     Temp 98.2 F (36.8 C)     Temp Source Oral     SpO2 96 %     Weight 198 lb 6.6 oz (90 kg)     Height 5\' 1"  (1.549 m)     Head Circumference      Peak Flow      Pain Score 3     Pain Loc      Pain Education      Exclude from Growth Chart     Most recent vital signs: Vitals:   02/22/23 1455  BP: (!) 178/87  Pulse: 74  Resp: 18  Temp: 98.2 F (36.8 C)  SpO2: 96%   General: Awake, alert, oriented. CV:  Good peripheral perfusion. Regular rate and rhythm. Resp:  Normal effort. Lungs clear. Abd:  No distention. Minimal tenderness to palpation of the RLQ   ED Results / Procedures / Treatments   Labs (all labs ordered are listed, but only abnormal results are displayed) Labs Reviewed  COMPREHENSIVE METABOLIC PANEL - Abnormal; Notable for the following components:      Result Value   Glucose, Bld 114 (*)    Calcium 8.8 (*)    Total Bilirubin 0.2 (*)    All other components within normal limits  URINALYSIS,  ROUTINE W REFLEX MICROSCOPIC - Abnormal; Notable for the following components:   Color, Urine YELLOW (*)    APPearance CLOUDY (*)    Protein, ur 30 (*)    Leukocytes,Ua LARGE (*)    Bacteria, UA MANY (*)    All other components within normal limits  URINE CULTURE  LIPASE, BLOOD  CBC     EKG  None   RADIOLOGY I independently interpreted and visualized the CT abd/pel. My interpretation: No appendicitis Radiology interpretation:  IMPRESSION:  1. No acute abnormality or explanation for right lower quadrant  pain. Normal appendix.  2. Large volume of stool in the colon with fecalization of distal  small bowel contents, suggesting slow transit and constipation. No  bowel obstruction or inflammation.  3. Moderate-sized hiatal hernia.  4. Endometrial thickening or fluid in the endometrial canal  measuring up to 13 mm. Recommend further evaluation with nonemergent  pelvic ultrasound. This is unchanged from February CT.    Aortic Atherosclerosis (ICD10-I70.0).    PROCEDURES:  Critical Care performed: No  MEDICATIONS ORDERED IN ED: Medications - No data to display   IMPRESSION / MDM / ASSESSMENT AND PLAN / ED COURSE  I reviewed the triage vital signs and the nursing notes.                              Differential diagnosis includes, but is not limited to, appendicitis, kidney stone, UTI, gastroenteritis  Patient's presentation is most consistent with acute presentation with potential threat to life or bodily function.   The patient is on the cardiac monitor to evaluate for evidence of arrhythmia and/or significant heart rate changes.  Patient presents to the emergency department today because of concerns for right lower quadrant abdominal pain and vomiting.  On exam patient has some mild tenderness to palpation of the right lower quadrant.  No guarding or rebound.  Patient is afebrile here in the emergency department.  Blood work without any concerning leukocytosis or  electrolyte abnormalities.  Will obtain CT scan to better evaluate right lower quadrant.  CT without concerning acute abnormality. UA is concerning for UTI which possibly explains the patient's pain. Discussed CT findings with patient. Will plan on discharging with prescription for antibiotics.     FINAL CLINICAL IMPRESSION(S) / ED DIAGNOSES   Final diagnoses:  Lower urinary tract infectious disease     Note:  This document was prepared using Dragon voice recognition software and may include unintentional dictation errors.    Phineas Semen, MD 02/22/23 2005

## 2023-02-22 NOTE — ED Triage Notes (Signed)
Pt sent from [facility] due to RLQ abd pain and n/v. Pt states abd pain has been going on for months but worsened yesterday.

## 2023-02-22 NOTE — Discharge Instructions (Signed)
Please follow up with your doctor about getting a pelvic ultrasound. Please seek medical attention for any high fevers, chest pain, shortness of breath, change in behavior, persistent vomiting, bloody stool or any other new or concerning symptoms.

## 2023-02-25 LAB — URINE CULTURE: Culture: >=100000 — AB

## 2023-02-26 NOTE — Group Note (Deleted)

## 2023-03-04 ENCOUNTER — Other Ambulatory Visit: Payer: Self-pay

## 2023-03-04 ENCOUNTER — Emergency Department
Admission: EM | Admit: 2023-03-04 | Discharge: 2023-03-04 | Disposition: A | Discharge status: Home / Self Care | Payer: Medicare PPO | Attending: Emergency Medicine | Admitting: Emergency Medicine

## 2023-03-04 ENCOUNTER — Emergency Department: Payer: Medicare PPO

## 2023-03-04 DIAGNOSIS — R079 Chest pain, unspecified: Secondary | ICD-10-CM | POA: Diagnosis present

## 2023-03-04 LAB — CBC
HCT: 42.4 % (ref 36.0–46.0)
Hemoglobin: 13.4 g/dL (ref 12.0–15.0)
MCH: 27.1 pg (ref 26.0–34.0)
MCHC: 31.6 g/dL (ref 30.0–36.0)
MCV: 85.7 fL (ref 80.0–100.0)
Platelets: 202 10*3/uL (ref 150–400)
RBC: 4.95 MIL/uL (ref 3.87–5.11)
RDW: 14.9 % (ref 11.5–15.5)
WBC: 7.5 10*3/uL (ref 4.0–10.5)
nRBC: 0 % (ref 0.0–0.2)

## 2023-03-04 LAB — BASIC METABOLIC PANEL
Anion gap: 11 (ref 5–15)
BUN: 10 mg/dL (ref 8–23)
CO2: 24 mmol/L (ref 22–32)
Calcium: 8.6 mg/dL — ABNORMAL LOW (ref 8.9–10.3)
Chloride: 101 mmol/L (ref 98–111)
Creatinine, Ser: 0.86 mg/dL (ref 0.44–1.00)
GFR, Estimated: >60 mL/min (ref >60)
Glucose, Bld: 209 mg/dL — ABNORMAL HIGH (ref 70–99)
Potassium: 4.1 mmol/L (ref 3.5–5.1)
Sodium: 136 mmol/L (ref 135–145)

## 2023-03-04 LAB — TROPONIN I (HIGH SENSITIVITY)
Troponin I (High Sensitivity): 5 ng/L (ref <18)
Troponin I (High Sensitivity): 5 ng/L (ref <18)

## 2023-03-04 NOTE — ED Provider Notes (Signed)
[facility] Provider Note    Event Date/Time   First MD Initiated Contact with Patient 03/04/23 1926     (approximate)   History   Chest Pain   HPI  Brandi [name] is a 87 y.o. female presents to the emerged [place] department today because of concerns for chest pain.  Located in the center part of the chest.  She has a hard time describing the exact sensation of the pain.  She first had the pain last night and took a nitroglycerin which helped.  She again had the pain this morning took another nitroglycerin which helped.  However this afternoon had the pain once more and took a nitroglycerin but the pain lasted longer.  By the time my exam the patient states the pain is gone.  She does state that she had an unusual exam and yesterday when she was in the bathroom and she was trying to clean behind the toilet and fell slightly.     Physical Exam   Triage Vital Signs: ED Triage Vitals  Encounter Vitals Group     BP 03/04/23 1527 (!) 192/76     Systolic BP Percentile --      Diastolic BP Percentile --      Pulse Rate 03/04/23 1527 80     Resp --      Temp 03/04/23 1527 97.7 F (36.5 C)     Temp Source 03/04/23 1527 Oral     SpO2 03/04/23 1527 97 %     Weight 03/04/23 1525 197 lb (89.4 kg)     Height 03/04/23 1525 5\' 1"  (1.549 m)     Head Circumference --      Peak Flow --      Pain Score 03/04/23 1525 5     Pain Loc --      Pain Education --      Exclude from Growth Chart --     Most recent vital signs: Vitals:   03/04/23 1527  BP: (!) 192/76  Pulse: 80  Temp: 97.7 F (36.5 C)  SpO2: 97%    General: Awake, alert, oriented. CV:  Good peripheral perfusion. Regular rate and rhythm. Resp:  Normal effort. Lungs clear. Abd:  No distention.    ED Results / Procedures / Treatments   Labs (all labs ordered are listed, but only abnormal results are displayed) Labs Reviewed  BASIC METABOLIC PANEL - Abnormal; Notable for the following components:       Result Value   Glucose, Bld 209 (*)    Calcium 8.6 (*)    All other components within normal limits  CBC  TROPONIN I (HIGH SENSITIVITY)  TROPONIN I (HIGH SENSITIVITY)     EKG  I, Phineas Semen, attending physician, personally viewed and interpreted this EKG  EKG Time: 1526 Rate: 77 Rhythm: Normal sinus rhythm Axis: normal Intervals: qtc 432 QRS: narrow ST changes: no st elevation Impression: normal ekg    RADIOLOGY I independently interpreted and visualized the CXR. My interpretation: No pneumonia Radiology interpretation:  IMPRESSION:  No acute cardiopulmonary abnormalities.     PROCEDURES:  Critical Care performed: No    MEDICATIONS ORDERED IN ED: Medications - No data to display   IMPRESSION / MDM / ASSESSMENT AND PLAN / ED COURSE  I reviewed the triage vital signs and the nursing notes.  Differential diagnosis includes, but is not limited to, pneumonia, ACS, costochondritis  Patient's presentation is most consistent with acute presentation with potential threat to life or bodily function.   The patient is on the cardiac monitor to evaluate for evidence of arrhythmia and/or significant heart rate changes.  Patient presented to the emergency department today because of concerns for chest pain.  The time my exam patient is chest pain-free.  Troponin was negative x 2.  X-ray without any concerning pneumonia or pneumothorax.  I have low suspicion for PE or dissection at this time.  Patient's blood pressure was elevated here although she states she had not taken her evening medications.  I did offer to give medication to help bring her blood pressure down but patient felt comfortable with discharge to take her medication at home.  Given the patient is asymptomatic at this time I think this is reasonable.  Encouraged primary care follow-up.      FINAL CLINICAL IMPRESSION(S) / ED DIAGNOSES   Final diagnoses:  Nonspecific  chest pain      Note:  This document was prepared using Dragon voice recognition software and may include unintentional dictation errors.    Phineas Semen, MD 03/04/23 [phone]

## 2023-03-04 NOTE — Discharge Instructions (Addendum)
Please seek medical attention for any high fevers, chest pain, shortness of breath, change in behavior, persistent vomiting, bloody stool or any other new or concerning symptoms.  

## 2023-03-04 NOTE — ED Notes (Signed)
Pt was wet. This Rn and her son got pt in a clean brief and paper scrub pants.

## 2023-03-04 NOTE — ED Triage Notes (Signed)
Pt sts that she has been having CP since last night. Pt sts that she took a nitroglycerin last night and the pain went away. Pt did the same when she woke up  this AM and the pain went away, however she took another nitroglycerin at 1400 today and the pain has not went away.

## 2023-03-04 NOTE — ED Notes (Signed)
This RN to bedside to introduce self to pt. Pt is caox4, in no acute distress and in the bed hooked to the monitor. Pt family at bedside.

## 2023-05-04 NOTE — Progress Notes (Unsigned)
05/08/2023 8:17 AM   Brandi [name] Sep 29, 1935 [id number]  Referring provider: Gracelyn Nurse, MD [address] [facility] [place],  [place] [address]  Urological history: 1. rUTI's -Contributing factors of age, vaginal atrophy, diabetes, incontinence and constipation -Documented urine cultures over the last year  None  2.  Mixed incontinence -Contributing factors of age, vaginal atrophy, diabetes, cystocele, HTN, sleep apnea (CPAP), arthritis, obesity, depression and anxiety  3. Nocturia -Risk factors for nocturia: obstructive sleep apnea (CPAP), hypertension, diabetes, arthritis, heart disease, anxiety and depression  4. Right renal cysts -contrast CT (09/2020) - Right renal cysts   No chief complaint on file.  HPI: Brandi [name] is a 87 y.o. female who presents today for yearly follow up with her husband, Nida Boatman.   Previous records reviewed.   PVR ***  PMH: Past Medical History:  Diagnosis Date   Acquired cyst of kidney    Anemia    Anxiety    Aortic atherosclerosis (HCC)    Arthritis, degenerative    Atrial fibrillation and flutter (HCC)    a.) CHA2DS2VASc = 6 (age x 2, sex, HTN, prior MI, T2DM);  b.) s/p LAA closure duirng CABG 05/29/2018; c.) rate/rhythm maintained on oral carvedilol + verapamil; no chronic anticoagulation s/p LAA closure   Baden-Walker grade 3 cystocele    Chronic cystitis    Coronary artery disease 08/05/2010   a.) LHC/PCI 08/05/2010: 10% oLM, 70% pLAD-1 (3.0 x 23 mm Xience V DES), 75% pLAD-2, 40% mLAD, 50% dLAD; b.) LHC 07/03/2011: 10% oLM, 10% ISR pLAD, 50% mLAD, LCx with min luminal irregs - med mgmt; c.) LHC 05/22/2018: 80% mLCx, 70% OM3, 90% oLAD, 60% pLAD, 60% ost 1st sep, 90% p-mRCA, 40% o-pLAD --> CVTS; d.) s/p 3v CABG 05/29/2018   Depression    Diabetic neuropathy (HCC)    Dyspnea    GERD (gastroesophageal reflux disease)    Headache    History of left atrial appendage closure 05/29/2018   HLD (hyperlipidemia)    HTN  (hypertension)    Implantable loop recorder present 09/20/2016   a.) has reached EOL; no longer funcitoning/being interrogated   Insomnia    a.) uses melatonin PRN   Murmur    NSTEMI (non-ST elevated myocardial infarction) (HCC) 05/20/2018   a.) troponins were trended: 0.30 --> 0.25 --> 0.22 --> 0.24 ng/L; b.) LHC 05/22/2018: 80% mLCx, 70% OM3, 90% oLAD, 60% pLAD, 60% ost 1st sep, 90% p-mRCA, 40% o-pLAD --> CVTS consult; c.) 3v CABG 05/29/2018: LIMA-LAD, SVG-OM1, SVG-RCA   NSVT (nonsustained ventricular tachycardia) (HCC) 01/2022   a.) holter 01/2022: rates as fast as 195 bpm lasting up to 12 beats   Obesity    OSA on CPAP    PONV (postoperative nausea and vomiting)    Presence of drug coated stent in LAD coronary artery    PSVT (paroxysmal supraventricular tachycardia) 01/2022   a.) holter 01/2022: rates as fast as 195 bpm lasting up to 12 beats   S/P CABG x 3 05/29/2018   a.) 3v CABG at [facility] 05/29/2018: LIMA-LAD, SVG-OM1, SVG-RCA   Sepsis (HCC)    Spinal stenosis    Syncopal episodes 10/2022   Syncope    T2DM (type 2 diabetes mellitus) (HCC)    Unstable angina (HCC)    Urge incontinence of urine     Surgical History: Past Surgical History:  Procedure Laterality Date   BREAST BIOPSY Right 2004   neg   BUNIONECTOMY     CHOLECYSTECTOMY  COLONOSCOPY WITH PROPOFOL N/A 06/14/2017   Procedure: COLONOSCOPY WITH PROPOFOL;  Surgeon: Christena Deem, MD;  Location: [facility] ENDOSCOPY;  Service: Endoscopy;  Laterality: N/A;   CORONARY ANGIOPLASTY WITH STENT PLACEMENT Left 08/05/2010   Procedure: CORONARY ANGIOPLASTY WITH STENT PLACEMENT; Location: [facility]; Surgeon(s): Harold Hedge, MD (diagnostic) and Rudean Hitt, MD (interventional)   CORONARY ARTERY BYPASS GRAFT N/A 05/29/2018   Procedure: CORONARY ARTERY BYPASS GRAFT; Location: [facility]; Surgeon: Flint Melter, MD   HYSTEROSCOPY WITH D & C N/A 12/14/2022   Procedure: FRACTIONAL DILATATION AND CURETTAGE /HYSTEROSCOPY;  Surgeon:  Suzy Bouchard, MD;  Location: [facility] ORS;  Service: Gynecology;  Laterality: N/A;   LEFT ATRIAL APPENDAGE OCCLUSION Left 05/29/2018   Procedure: LEFT ATRIAL APPENDAGE CLOSURE; Location: [facility]; Surgeon: Flint Melter, MD   LEFT HEART CATH AND CORONARY ANGIOGRAPHY N/A 05/22/2018   Procedure: LEFT HEART CATH AND CORONARY ANGIOGRAPHY;  Surgeon: Lamar Blinks, MD;  Location: [facility] INVASIVE CV LAB;  Service: Cardiovascular;  Laterality: N/A;   LEFT HEART CATH AND CORONARY ANGIOGRAPHY Left 07/03/2011   Procedure: LEFT HEART CATH AND CORONARY ANGIOGRAPHY; Location: [facility]; Surgeon: Harold Hedge, MD   LEFT HEART CATH AND CORONARY ANGIOGRAPHY Left 05/22/2018   Procedure: LEFT HEART CATH AND CORONARY ANGIOGRAPHY; Location: [facility]; Surgeon: Harold Hedge, MD   LOOP RECORDER INSERTION N/A 09/20/2016   Procedure: Loop Recorder Insertion;  Surgeon: Marcina Millard, MD;  Location: [facility] INVASIVE CV LAB;  Service: Cardiovascular;  Laterality: N/A;   SEPTOPLASTY     TOTAL KNEE ARTHROPLASTY Bilateral    XI ROBOTIC ASSISTED INGUINAL HERNIA REPAIR WITH MESH Left 10/14/2020   Procedure: XI ROBOTIC ASSISTED INGUINAL HERNIA REPAIR WITH MESH;  Surgeon: Campbell Lerner, MD;  Location: [facility] ORS;  Service: General;  Laterality: Left;    Home Medications:  Allergies as of 05/08/2023       Reactions   Accupril [quinapril Hcl] Rash   Micardis [telmisartan] Other (See Comments)   Dizziness   Quinapril Rash        Medication List        Accurate as of May 04, 2023  8:17 AM. If you have any questions, ask your nurse or doctor.          acetaminophen 500 MG tablet Commonly known as: TYLENOL Take 1,000 mg by mouth 2 (two) times daily.   aspirin EC 81 MG tablet Take 81 mg by mouth daily.   atorvastatin 40 MG tablet Commonly known as: LIPITOR Take 40 mg by mouth every evening.   budesonide-formoterol 80-4.5 MCG/ACT inhaler Commonly known as: SYMBICORT Inhale 2 puffs into the lungs 2 (two)  times daily.   CALCIUM CARBONATE+VITAMIN D PO Take 1 tablet by mouth daily at 6 (six) AM.   carboxymethylcellulose 0.5 % Soln Commonly known as: REFRESH PLUS Apply 1-2 drops to eye at bedtime.   carvedilol 12.5 MG tablet Commonly known as: COREG Take 1 tablet (12.5 mg total) by mouth 2 (two) times daily. HOLD IF SBP <130   famotidine 20 MG tablet Commonly known as: PEPCID Take 20 mg by mouth 2 (two) times daily.   ferrous sulfate 325 (65 FE) MG tablet Take 325 mg by mouth daily with breakfast.   gabapentin 400 MG capsule Commonly known as: NEURONTIN Take 400 mg by mouth 3 (three) times daily.   glipiZIDE 5 MG tablet Commonly known as: GLUCOTROL Take 1 tablet (5 mg total) by mouth daily. What changed: when to take this   Janumet XR 50-1000 MG Tb24 Generic drug: SitaGLIPtin-MetFORMIN HCl Take  1 tablet by mouth 2 (two) times daily.   Lantus SoloStar 100 UNIT/ML Solostar Pen Generic drug: insulin glargine Inject 12 Units into the skin every morning.   loratadine 10 MG tablet Commonly known as: CLARITIN Take 10 mg by mouth every morning.   losartan 100 MG tablet Commonly known as: COZAAR Take 100 mg by mouth at bedtime.   meclizine 12.5 MG tablet Commonly known as: ANTIVERT Take 12.5 mg by mouth 3 (three) times daily as needed for dizziness.   Melatonin 10 MG Tabs Take 1 tablet by mouth at bedtime.   mirabegron ER 50 MG Tb24 tablet Commonly known as: MYRBETRIQ Take 1 tablet (50 mg total) by mouth daily. What changed: when to take this   nitroGLYCERIN 0.4 MG/SPRAY spray Commonly known as: NITROLINGUAL Place 1 spray under the tongue every 5 (five) minutes x 3 doses as needed for chest pain.   PreserVision/Lutein Caps Take 1 capsule by mouth 2 (two) times daily.   sodium chloride 0.65 % nasal spray Commonly known as: OCEAN Place 1 spray into the nose as needed.   Verapamil HCl CR 300 MG Cp24 Take 1 capsule by mouth at bedtime.   VITAMIN B-12 PO Take 1  tablet by mouth daily.        Allergies:  Allergies  Allergen Reactions   Accupril [Quinapril Hcl] Rash   Micardis [Telmisartan] Other (See Comments)    Dizziness   Quinapril Rash    Family History: Family History  Problem Relation Age of Onset   Breast cancer Cousin    Skin cancer Other    Diabetes Other    Hypercholesterolemia Other    Hypertension Other    Bladder Cancer Maternal Aunt    COPD Other    Kidney disease Neg Hx    Kidney cancer Neg Hx     Social History:  reports that she has never smoked. She has never used smokeless tobacco. She reports that she does not drink alcohol and does not use drugs.  ROS: Pertinent ROS in HPI  Physical Exam: There were no vitals taken for this visit.  Constitutional:  Well nourished. Alert and oriented, No acute distress. HEENT: [place] AT, moist mucus membranes.  Trachea midline, no masses. Cardiovascular: No clubbing, cyanosis, or edema. Respiratory: Normal respiratory effort, no increased work of breathing. GU: No CVA tenderness.  No bladder fullness or masses.  Recession of labia minora, dry, pale vulvar vaginal mucosa and loss of mucosal ridges and folds.  Normal urethral meatus, no lesions, no prolapse, no discharge.   No urethral masses, tenderness and/or tenderness. No bladder fullness, tenderness or masses. *** vagina mucosa, *** estrogen effect, no discharge, no lesions, *** pelvic support, *** cystocele and *** rectocele noted.  No cervical motion tenderness.  Uterus is freely mobile and non-fixed.  No adnexal/parametria masses or tenderness noted.  Anus and perineum are without rashes or lesions.   ***  Neurologic: Grossly intact, no focal deficits, moving all 4 extremities. Psychiatric: Normal mood and affect.    Laboratory data: Comprehensive Metabolic Panel (CMP) Order: [id number] Component Ref Range & Units 1 mo ago  Glucose 70 - 110 mg/dL 90  Sodium 469 - 629 mmol/L 140  Potassium 3.6 - 5.1 mmol/L 4   Chloride 97 - 109 mmol/L 104  Carbon Dioxide (CO2) 22.0 - 32.0 mmol/L 27.7  Urea Nitrogen (BUN) 7 - 25 mg/dL 10  Creatinine 0.6 - 1.1 mg/dL 0.7  Glomerular Filtration Rate (eGFR) >60 mL/min/1.73sq m 85  Comment: CKD-EPI (  2021) does not include patient's race in the calculation of eGFR.  Monitoring changes of plasma creatinine and eGFR over time is useful for monitoring kidney function.  Interpretive Ranges for eGFR (CKD-EPI 2021):  eGFR:       >60 mL/min/1.73 sq. m - Normal eGFR:       30-59 mL/min/1.73 sq. m - Moderately Decreased eGFR:       15-29 mL/min/1.73 sq. m  - Severely Decreased eGFR:       < 15 mL/min/1.73 sq. m  - Kidney Failure   Note: These eGFR calculations do not apply in acute situations when eGFR is changing rapidly or patients on dialysis.  Calcium 8.7 - 10.3 mg/dL 9  AST 8 - 39 U/L 13  ALT 5 - 38 U/L 11  Alk Phos (alkaline Phosphatase) 34 - 104 U/L 67  Albumin 3.5 - 4.8 g/dL 3.6  Bilirubin, Total 0.3 - 1.2 mg/dL 0.5  Protein, Total 6.1 - 7.9 g/dL 6.2  A/G Ratio 1.0 - 5.0 gm/dL 1.4  Resulting Agency [facility]   Specimen Collected: 03/22/23 09:28   Performed by: [facility] Last Resulted: 03/22/23 12:14  Received From: [facility]  Result Received: 05/04/23 08:17   CBC w/auto Differential (5 Part) Order: [id number] Component Ref Range & Units 1 mo ago  WBC (White Blood Cell Count) 4.1 - 10.2 10^3/uL 5.9  RBC (Red Blood Cell Count) 4.04 - 5.48 10^6/uL 4.8  Hemoglobin 12.0 - 15.0 gm/dL 14.7  Hematocrit 82.9 - 47.0 % 41.3  MCV (Mean Corpuscular Volume) 80.0 - 100.0 fl 86  MCH (Mean Corpuscular Hemoglobin) 27.0 - 31.2 pg 27.7  MCHC (Mean Corpuscular Hemoglobin Concentration) 32.0 - 36.0 gm/dL 56.2  Platelet Count 130 - 450 10^3/uL 191  RDW-CV (Red Cell Distribution Width) 11.6 - 14.8 % 14.6  MPV (Mean Platelet Volume) 9.4 - 12.4 fl 12.4  Neutrophils 1.50 - 7.80 10^3/uL 3.11   Lymphocytes 1.00 - 3.60 10^3/uL 1.78  Monocytes 0.00 - 1.50 10^3/uL 0.54  Eosinophils 0.00 - 0.55 10^3/uL 0.39  Basophils 0.00 - 0.09 10^3/uL 0.04  Neutrophil % 32.0 - 70.0 % 52.9  Lymphocyte % 10.0 - 50.0 % 30.3  Monocyte % 4.0 - 13.0 % 9.2  Eosinophil % 1.0 - 5.0 % 6.6 High   Basophil% 0.0 - 2.0 % 0.7  Immature Granulocyte % <=0.7 % 0.3  Immature Granulocyte Count <=0.06 10^3/L 0.02  Resulting Agency [facility]   Specimen Collected: 03/22/23 09:28   Performed by: [facility] Last Resulted: 03/22/23 10:55  Received From: [facility]  Result Received: 05/04/23 08:17    Hemoglobin A1C Order: [id number] Component Ref Range & Units 1 mo ago  Hemoglobin A1C 4.2 - 5.6 % 6.5 High   Average Blood Glucose (Calc) mg/dL 295  Resulting Agency [facility]  Narrative Performed by [facility] Normal Range:    4.2 - 5.6% Increased Risk:  5.7 - 6.4% Diabetes:        >= 6.5% Glycemic Control for adults with diabetes:  <7%    Specimen Collected: 03/22/23 09:28   Performed by: [facility] Last Resulted: 03/22/23 12:28  Received From: [facility]  Result Received: 05/04/23 08:17   Urinalysis w/Microscopic Order: [id number] Component Ref Range & Units 1 mo ago  Color Colorless, Straw, Light Yellow, Yellow, Dark Yellow Colorless  Clarity Clear Clear  Specific Gravity 1.005 - 1.030 1.008  pH, Urine 5.0 - 8.0 6.5  Protein, Urinalysis Negative mg/dL Negative  Glucose, Urinalysis Negative mg/dL Negative  Ketones, Urinalysis Negative mg/dL Negative  Blood, Urinalysis Negative Negative  Nitrite, Urinalysis Negative Negative  Leukocyte Esterase, Urinalysis Negative Negative  Bilirubin, Urinalysis Negative Negative  Urobilinogen, Urinalysis 0.2 - 1.0 mg/dL 0.2  WBC, UA <=5 /hpf 2  Red Blood Cells, Urinalysis <=3 /hpf 1  Bacteria, Urinalysis 0 - 5 /hpf 0-5   Squamous Epithelial Cells, Urinalysis /hpf 0  Resulting Agency [facility]   Specimen Collected: 03/22/23 12:14   Performed by: [facility] Last Resulted: 03/22/23 16:24  Received From: [facility]  Result Received: 05/04/23 08:17  I have reviewed the labs.  Pertinent Imaging: ***  Assessment & Plan:    1. Mixed incontinence -Urine not sent for culture at this time as she is asymptomatic and most likely colonized -at goal w/ Myrbetriq 50 mg daily   2. Nocturia -not bothersome    No follow-ups on file.  These notes generated with voice recognition software. I apologize for typographical errors.  Cloretta Ned  [facility] [address]  [address] [place], [place] [address] [phone]

## 2023-05-08 ENCOUNTER — Ambulatory Visit: Payer: Medicare PPO | Admitting: Urology

## 2023-05-08 ENCOUNTER — Encounter: Payer: Self-pay | Admitting: Urology

## 2023-05-08 VITALS — BP 147/80 | HR 82

## 2023-05-08 DIAGNOSIS — N3946 Mixed incontinence: Secondary | ICD-10-CM | POA: Diagnosis not present

## 2023-05-08 DIAGNOSIS — R351 Nocturia: Secondary | ICD-10-CM

## 2023-05-08 LAB — BLADDER SCAN AMB NON-IMAGING: Scan Result: 0

## 2023-05-10 ENCOUNTER — Emergency Department: Payer: Medicare PPO

## 2023-05-10 ENCOUNTER — Other Ambulatory Visit: Payer: Self-pay

## 2023-05-10 ENCOUNTER — Inpatient Hospital Stay
Admission: EM | Admit: 2023-05-10 | Discharge: 2023-05-13 | DRG: 305 | Disposition: A | Discharge status: Home / Self Care | Payer: Medicare PPO | Source: Ambulatory Visit | Attending: Internal Medicine | Admitting: Internal Medicine

## 2023-05-10 DIAGNOSIS — I161 Hypertensive emergency: Secondary | ICD-10-CM | POA: Diagnosis not present

## 2023-05-10 DIAGNOSIS — Z803 Family history of malignant neoplasm of breast: Secondary | ICD-10-CM

## 2023-05-10 DIAGNOSIS — K219 Gastro-esophageal reflux disease without esophagitis: Secondary | ICD-10-CM | POA: Diagnosis present

## 2023-05-10 DIAGNOSIS — Z9049 Acquired absence of other specified parts of digestive tract: Secondary | ICD-10-CM

## 2023-05-10 DIAGNOSIS — E66813 Obesity, class 3: Secondary | ICD-10-CM | POA: Diagnosis present

## 2023-05-10 DIAGNOSIS — I480 Paroxysmal atrial fibrillation: Secondary | ICD-10-CM | POA: Diagnosis present

## 2023-05-10 DIAGNOSIS — Z9889 Other specified postprocedural states: Secondary | ICD-10-CM

## 2023-05-10 DIAGNOSIS — Z7951 Long term (current) use of inhaled steroids: Secondary | ICD-10-CM

## 2023-05-10 DIAGNOSIS — H538 Other visual disturbances: Secondary | ICD-10-CM | POA: Diagnosis present

## 2023-05-10 DIAGNOSIS — Z79899 Other long term (current) drug therapy: Secondary | ICD-10-CM

## 2023-05-10 DIAGNOSIS — I169 Hypertensive crisis, unspecified: Secondary | ICD-10-CM

## 2023-05-10 DIAGNOSIS — Z888 Allergy status to other drugs, medicaments and biological substances status: Secondary | ICD-10-CM

## 2023-05-10 DIAGNOSIS — Z825 Family history of asthma and other chronic lower respiratory diseases: Secondary | ICD-10-CM

## 2023-05-10 DIAGNOSIS — M549 Dorsalgia, unspecified: Secondary | ICD-10-CM | POA: Diagnosis present

## 2023-05-10 DIAGNOSIS — M4800 Spinal stenosis, site unspecified: Secondary | ICD-10-CM | POA: Diagnosis present

## 2023-05-10 DIAGNOSIS — I2510 Atherosclerotic heart disease of native coronary artery without angina pectoris: Secondary | ICD-10-CM | POA: Diagnosis present

## 2023-05-10 DIAGNOSIS — G8929 Other chronic pain: Secondary | ICD-10-CM | POA: Diagnosis present

## 2023-05-10 DIAGNOSIS — Z808 Family history of malignant neoplasm of other organs or systems: Secondary | ICD-10-CM

## 2023-05-10 DIAGNOSIS — F325 Major depressive disorder, single episode, in full remission: Secondary | ICD-10-CM | POA: Diagnosis present

## 2023-05-10 DIAGNOSIS — E1140 Type 2 diabetes mellitus with diabetic neuropathy, unspecified: Secondary | ICD-10-CM | POA: Diagnosis present

## 2023-05-10 DIAGNOSIS — I252 Old myocardial infarction: Secondary | ICD-10-CM

## 2023-05-10 DIAGNOSIS — R42 Dizziness and giddiness: Principal | ICD-10-CM

## 2023-05-10 DIAGNOSIS — Z6835 Body mass index (BMI) 35.0-35.9, adult: Secondary | ICD-10-CM

## 2023-05-10 DIAGNOSIS — Z7982 Long term (current) use of aspirin: Secondary | ICD-10-CM

## 2023-05-10 DIAGNOSIS — Z96653 Presence of artificial knee joint, bilateral: Secondary | ICD-10-CM | POA: Diagnosis present

## 2023-05-10 DIAGNOSIS — R55 Syncope and collapse: Secondary | ICD-10-CM | POA: Diagnosis present

## 2023-05-10 DIAGNOSIS — D649 Anemia, unspecified: Secondary | ICD-10-CM | POA: Diagnosis present

## 2023-05-10 DIAGNOSIS — H81399 Other peripheral vertigo, unspecified ear: Secondary | ICD-10-CM

## 2023-05-10 DIAGNOSIS — Z8052 Family history of malignant neoplasm of bladder: Secondary | ICD-10-CM

## 2023-05-10 DIAGNOSIS — I4892 Unspecified atrial flutter: Secondary | ICD-10-CM | POA: Diagnosis present

## 2023-05-10 DIAGNOSIS — J019 Acute sinusitis, unspecified: Secondary | ICD-10-CM

## 2023-05-10 DIAGNOSIS — E876 Hypokalemia: Secondary | ICD-10-CM | POA: Diagnosis present

## 2023-05-10 DIAGNOSIS — G4733 Obstructive sleep apnea (adult) (pediatric): Secondary | ICD-10-CM

## 2023-05-10 DIAGNOSIS — Z8249 Family history of ischemic heart disease and other diseases of the circulatory system: Secondary | ICD-10-CM

## 2023-05-10 DIAGNOSIS — Z87440 Personal history of urinary (tract) infections: Secondary | ICD-10-CM

## 2023-05-10 DIAGNOSIS — Z794 Long term (current) use of insulin: Secondary | ICD-10-CM

## 2023-05-10 DIAGNOSIS — E785 Hyperlipidemia, unspecified: Secondary | ICD-10-CM | POA: Diagnosis present

## 2023-05-10 DIAGNOSIS — I119 Hypertensive heart disease without heart failure: Secondary | ICD-10-CM | POA: Diagnosis present

## 2023-05-10 DIAGNOSIS — Z833 Family history of diabetes mellitus: Secondary | ICD-10-CM

## 2023-05-10 DIAGNOSIS — E1151 Type 2 diabetes mellitus with diabetic peripheral angiopathy without gangrene: Secondary | ICD-10-CM | POA: Diagnosis present

## 2023-05-10 DIAGNOSIS — Z8711 Personal history of peptic ulcer disease: Secondary | ICD-10-CM

## 2023-05-10 DIAGNOSIS — E119 Type 2 diabetes mellitus without complications: Secondary | ICD-10-CM

## 2023-05-10 DIAGNOSIS — Z955 Presence of coronary angioplasty implant and graft: Secondary | ICD-10-CM

## 2023-05-10 DIAGNOSIS — Z7984 Long term (current) use of oral hypoglycemic drugs: Secondary | ICD-10-CM

## 2023-05-10 LAB — BASIC METABOLIC PANEL
Anion gap: 12 (ref 5–15)
BUN: 13 mg/dL (ref 8–23)
CO2: 26 mmol/L (ref 22–32)
Calcium: 8.9 mg/dL (ref 8.9–10.3)
Chloride: 101 mmol/L (ref 98–111)
Creatinine, Ser: 0.69 mg/dL (ref 0.44–1.00)
GFR, Estimated: >60 mL/min (ref >60)
Glucose, Bld: 125 mg/dL — ABNORMAL HIGH (ref 70–99)
Potassium: 4 mmol/L (ref 3.5–5.1)
Sodium: 139 mmol/L (ref 135–145)

## 2023-05-10 LAB — CBC
HCT: 43.3 % (ref 36.0–46.0)
Hemoglobin: 14.1 g/dL (ref 12.0–15.0)
MCH: 27.9 pg (ref 26.0–34.0)
MCHC: 32.6 g/dL (ref 30.0–36.0)
MCV: 85.7 fL (ref 80.0–100.0)
Platelets: 204 10*3/uL (ref 150–400)
RBC: 5.05 MIL/uL (ref 3.87–5.11)
RDW: 14.6 % (ref 11.5–15.5)
WBC: 6.7 10*3/uL (ref 4.0–10.5)
nRBC: 0 % (ref 0.0–0.2)

## 2023-05-10 MED ORDER — LOSARTAN POTASSIUM 50 MG PO TABS
100.0000 mg | ORAL_TABLET | Freq: Every day | ORAL | Status: DC
  Administered 2023-05-11 – 2023-05-12 (×3): 100 mg via ORAL
  Filled 2023-05-10 (×3): qty 2

## 2023-05-10 MED ORDER — ACETAMINOPHEN 325 MG PO TABS
650.0000 mg | ORAL_TABLET | Freq: Four times a day (QID) | ORAL | Status: DC | PRN
  Administered 2023-05-11 – 2023-05-12 (×2): 650 mg via ORAL
  Filled 2023-05-10 (×2): qty 2

## 2023-05-10 MED ORDER — INSULIN GLARGINE-YFGN 100 UNIT/ML ~~LOC~~ SOLN
10.0000 [IU] | Freq: Every day | SUBCUTANEOUS | Status: DC
  Administered 2023-05-11 – 2023-05-13 (×3): 10 [IU] via SUBCUTANEOUS
  Filled 2023-05-10 (×4): qty 0.1

## 2023-05-10 MED ORDER — ENOXAPARIN SODIUM 60 MG/0.6ML IJ SOSY
0.5000 mg/kg | PREFILLED_SYRINGE | INTRAMUSCULAR | Status: DC
  Administered 2023-05-11 – 2023-05-12 (×3): 42.5 mg via SUBCUTANEOUS
  Filled 2023-05-10 (×3): qty 0.6

## 2023-05-10 MED ORDER — ASPIRIN 81 MG PO TBEC
81.0000 mg | DELAYED_RELEASE_TABLET | Freq: Every day | ORAL | Status: DC
  Administered 2023-05-11 – 2023-05-13 (×3): 81 mg via ORAL
  Filled 2023-05-10 (×3): qty 1

## 2023-05-10 MED ORDER — CARVEDILOL 12.5 MG PO TABS
12.5000 mg | ORAL_TABLET | Freq: Two times a day (BID) | ORAL | Status: DC
  Administered 2023-05-11 – 2023-05-12 (×3): 12.5 mg via ORAL
  Filled 2023-05-10 (×3): qty 2

## 2023-05-10 MED ORDER — MELATONIN 5 MG PO TABS
10.0000 mg | ORAL_TABLET | Freq: Every day | ORAL | Status: DC
  Administered 2023-05-11 – 2023-05-12 (×3): 10 mg via ORAL
  Filled 2023-05-10 (×3): qty 2

## 2023-05-10 MED ORDER — ONDANSETRON HCL 4 MG PO TABS
4.0000 mg | ORAL_TABLET | Freq: Four times a day (QID) | ORAL | Status: DC | PRN
  Administered 2023-05-11: 4 mg via ORAL
  Filled 2023-05-10: qty 1

## 2023-05-10 MED ORDER — LORATADINE 10 MG PO TABS
10.0000 mg | ORAL_TABLET | ORAL | Status: DC
  Administered 2023-05-11 – 2023-05-13 (×3): 10 mg via ORAL
  Filled 2023-05-10 (×3): qty 1

## 2023-05-10 MED ORDER — INSULIN ASPART 100 UNIT/ML IJ SOLN
0.0000 [IU] | Freq: Three times a day (TID) | INTRAMUSCULAR | Status: DC
  Administered 2023-05-11: 3 [IU] via SUBCUTANEOUS
  Administered 2023-05-11: 2 [IU] via SUBCUTANEOUS
  Administered 2023-05-12: 5 [IU] via SUBCUTANEOUS
  Administered 2023-05-12: 2 [IU] via SUBCUTANEOUS
  Administered 2023-05-12: 3 [IU] via SUBCUTANEOUS
  Administered 2023-05-13: 2 [IU] via SUBCUTANEOUS
  Filled 2023-05-10 (×6): qty 1

## 2023-05-10 MED ORDER — LABETALOL HCL 5 MG/ML IV SOLN
20.0000 mg | Freq: Once | INTRAVENOUS | Status: AC
Start: 2023-05-10 — End: 2023-05-10
  Administered 2023-05-10: 20 mg via INTRAVENOUS
  Filled 2023-05-10: qty 4

## 2023-05-10 MED ORDER — FLUTICASONE PROPIONATE 50 MCG/ACT NA SUSP
1.0000 | Freq: Every day | NASAL | Status: DC
  Administered 2023-05-11 – 2023-05-13 (×3): 1 via NASAL
  Filled 2023-05-10: qty 16

## 2023-05-10 MED ORDER — ACETAMINOPHEN 650 MG RE SUPP
650.0000 mg | Freq: Four times a day (QID) | RECTAL | Status: DC | PRN
Start: 2023-05-10 — End: 2023-05-13

## 2023-05-10 MED ORDER — ATORVASTATIN CALCIUM 20 MG PO TABS
40.0000 mg | ORAL_TABLET | Freq: Every evening | ORAL | Status: DC
  Administered 2023-05-11 – 2023-05-12 (×2): 40 mg via ORAL
  Filled 2023-05-10 (×2): qty 2

## 2023-05-10 MED ORDER — LABETALOL HCL 5 MG/ML IV SOLN
10.0000 mg | Freq: Once | INTRAVENOUS | Status: AC
  Administered 2023-05-10: 10 mg via INTRAVENOUS
  Filled 2023-05-10: qty 4

## 2023-05-10 MED ORDER — HYDRALAZINE HCL 20 MG/ML IJ SOLN
5.0000 mg | INTRAMUSCULAR | Status: DC | PRN
Start: 2023-05-10 — End: 2023-05-11
  Administered 2023-05-11: 5 mg via INTRAVENOUS
  Filled 2023-05-10: qty 1

## 2023-05-10 MED ORDER — ONDANSETRON HCL 4 MG/2ML IJ SOLN: 4.0000 mg | Freq: Four times a day (QID) | INTRAMUSCULAR | Status: DC | PRN

## 2023-05-10 MED ORDER — MECLIZINE HCL 25 MG PO TABS: 12.5000 mg | ORAL_TABLET | Freq: Three times a day (TID) | ORAL | Status: DC | PRN

## 2023-05-10 MED ORDER — SALINE SPRAY 0.65 % NA SOLN: 1.0000 | NASAL | Status: DC | PRN

## 2023-05-10 MED ORDER — HYDROCODONE-ACETAMINOPHEN 5-325 MG PO TABS: 1.0000 | ORAL_TABLET | ORAL | Status: DC | PRN

## 2023-05-10 MED ORDER — VERAPAMIL HCL ER 240 MG PO TBCR
240.0000 mg | EXTENDED_RELEASE_TABLET | Freq: Every day | ORAL | Status: DC
  Administered 2023-05-11 – 2023-05-12 (×3): 240 mg via ORAL
  Filled 2023-05-10 (×3): qty 1

## 2023-05-10 MED ORDER — MIRABEGRON ER 50 MG PO TB24
50.0000 mg | ORAL_TABLET | Freq: Every day | ORAL | Status: DC
  Administered 2023-05-12 – 2023-05-13 (×2): 50 mg via ORAL
  Filled 2023-05-10 (×4): qty 1

## 2023-05-10 MED ORDER — INSULIN ASPART 100 UNIT/ML IJ SOLN
0.0000 [IU] | Freq: Every day | INTRAMUSCULAR | Status: DC
  Filled 2023-05-10: qty 1

## 2023-05-10 NOTE — Assessment & Plan Note (Signed)
 Continue aspirin, atorvastatin and carvedilol

## 2023-05-10 NOTE — H&P (Signed)
History and Physical    Patient: Brandi [name] ZOX:[id number] DOB: 02/13/1936 DOA: 05/10/2023 DOS: the patient was seen and examined on 05/10/2023 PCP: Gracelyn Nurse, MD  Patient coming from: Home  Chief Complaint:  Chief Complaint  Patient presents with   Hypertension   Dizziness    HPI: Brandi [name] is a 87 y.o. female with medical history significant for Recurrent vasovagal syncope, recurrent UTI, DM, paroxysmal A-fib s/p LAA clipping, CAD s/p CABG and LAD stent, PUD, spinal stenosis, OSA on CPAP and recurrent UTI, presenting with dizziness x 1 week and hypertensive emergency. Patient reports that for the past week she has been having dizziness and the sensation of the room spinning.  At her physical therapy session they did the Epley maneuver however she had no improvement in her symptoms.  Additionally, she has been having blurred vision and a headache for which she saw her PCP and they sent her into the ED for evaluation.  She denies nausea or vomiting, one-sided weakness numbness or tingling. ED course and data review: BP 226/115 on arrival but otherwise normal vitals. Labs CBC and BMP unremarkable EKG ED, personally viewed and interpreted showed NSR at 84 with nonspecific ST-T wave changes MRI brain was nonacute showing the following IMPRESSION: 1. No acute intracranial abnormality. 2. Moderately advanced chronic microvascular ischemic disease. 3. Scattered paranasal sinus disease with small air-fluid level within the left sphenoid sinus. Clinical correlation for possible acute sinusitis recommended.  Patient given a dose of IV labetalol 20 mg without improvement.  A repeat dose of 10 mg was ordered and hospitalist consulted for admission.   Review of Systems: As mentioned in the history of present illness. All other systems reviewed and are negative.  Past Medical History:  Diagnosis Date   Acquired cyst of kidney    Anemia    Anxiety    Aortic atherosclerosis (HCC)     Arthritis, degenerative    Atrial fibrillation and flutter (HCC)    a.) CHA2DS2VASc = 6 (age x 2, sex, HTN, prior MI, T2DM);  b.) s/p LAA closure duirng CABG 05/29/2018; c.) rate/rhythm maintained on oral carvedilol + verapamil; no chronic anticoagulation s/p LAA closure   Baden-Walker grade 3 cystocele    Chronic cystitis    Coronary artery disease 08/05/2010   a.) LHC/PCI 08/05/2010: 10% oLM, 70% pLAD-1 (3.0 x 23 mm Xience V DES), 75% pLAD-2, 40% mLAD, 50% dLAD; b.) LHC 07/03/2011: 10% oLM, 10% ISR pLAD, 50% mLAD, LCx with min luminal irregs - med mgmt; c.) LHC 05/22/2018: 80% mLCx, 70% OM3, 90% oLAD, 60% pLAD, 60% ost 1st sep, 90% p-mRCA, 40% o-pLAD --> CVTS; d.) s/p 3v CABG 05/29/2018   Depression    Diabetic neuropathy (HCC)    Dyspnea    GERD (gastroesophageal reflux disease)    Headache    History of left atrial appendage closure 05/29/2018   HLD (hyperlipidemia)    HTN (hypertension)    Implantable loop recorder present 09/20/2016   a.) has reached EOL; no longer funcitoning/being interrogated   Insomnia    a.) uses melatonin PRN   Murmur    NSTEMI (non-ST elevated myocardial infarction) (HCC) 05/20/2018   a.) troponins were trended: 0.30 --> 0.25 --> 0.22 --> 0.24 ng/L; b.) LHC 05/22/2018: 80% mLCx, 70% OM3, 90% oLAD, 60% pLAD, 60% ost 1st sep, 90% p-mRCA, 40% o-pLAD --> CVTS consult; c.) 3v CABG 05/29/2018: LIMA-LAD, SVG-OM1, SVG-RCA   NSVT (nonsustained ventricular tachycardia) (HCC) 01/2022   a.) holter 01/2022: rates  as fast as 195 bpm lasting up to 12 beats   Obesity    OSA on CPAP    PONV (postoperative nausea and vomiting)    Presence of drug coated stent in LAD coronary artery    PSVT (paroxysmal supraventricular tachycardia) (HCC) 01/2022   a.) holter 01/2022: rates as fast as 195 bpm lasting up to 12 beats   S/P CABG x 3 05/29/2018   a.) 3v CABG at [facility] 05/29/2018: LIMA-LAD, SVG-OM1, SVG-RCA   Sepsis (HCC)    Spinal stenosis    Syncopal episodes 10/2022    Syncope    T2DM (type 2 diabetes mellitus) (HCC)    Unstable angina (HCC)    Urge incontinence of urine    Past Surgical History:  Procedure Laterality Date   BREAST BIOPSY Right 2004   neg   BUNIONECTOMY     CHOLECYSTECTOMY     COLONOSCOPY WITH PROPOFOL N/A 06/14/2017   Procedure: COLONOSCOPY WITH PROPOFOL;  Surgeon: Christena Deem, MD;  Location: [facility] ENDOSCOPY;  Service: Endoscopy;  Laterality: N/A;   CORONARY ANGIOPLASTY WITH STENT PLACEMENT Left 08/05/2010   Procedure: CORONARY ANGIOPLASTY WITH STENT PLACEMENT; Location: [facility]; Surgeon(s): Harold Hedge, MD (diagnostic) and Rudean Hitt, MD (interventional)   CORONARY ARTERY BYPASS GRAFT N/A 05/29/2018   Procedure: CORONARY ARTERY BYPASS GRAFT; Location: [facility]; Surgeon: Flint Melter, MD   HYSTEROSCOPY WITH D & C N/A 12/14/2022   Procedure: FRACTIONAL DILATATION AND CURETTAGE /HYSTEROSCOPY;  Surgeon: Suzy Bouchard, MD;  Location: [facility] ORS;  Service: Gynecology;  Laterality: N/A;   LEFT ATRIAL APPENDAGE OCCLUSION Left 05/29/2018   Procedure: LEFT ATRIAL APPENDAGE CLOSURE; Location: [facility]; Surgeon: Flint Melter, MD   LEFT HEART CATH AND CORONARY ANGIOGRAPHY N/A 05/22/2018   Procedure: LEFT HEART CATH AND CORONARY ANGIOGRAPHY;  Surgeon: Lamar Blinks, MD;  Location: [facility] INVASIVE CV LAB;  Service: Cardiovascular;  Laterality: N/A;   LEFT HEART CATH AND CORONARY ANGIOGRAPHY Left 07/03/2011   Procedure: LEFT HEART CATH AND CORONARY ANGIOGRAPHY; Location: [facility]; Surgeon: Harold Hedge, MD   LEFT HEART CATH AND CORONARY ANGIOGRAPHY Left 05/22/2018   Procedure: LEFT HEART CATH AND CORONARY ANGIOGRAPHY; Location: [facility]; Surgeon: Harold Hedge, MD   LOOP RECORDER INSERTION N/A 09/20/2016   Procedure: Loop Recorder Insertion;  Surgeon: Marcina Millard, MD;  Location: [facility] INVASIVE CV LAB;  Service: Cardiovascular;  Laterality: N/A;   SEPTOPLASTY     TOTAL KNEE ARTHROPLASTY Bilateral    XI ROBOTIC ASSISTED INGUINAL HERNIA REPAIR  WITH MESH Left 10/14/2020   Procedure: XI ROBOTIC ASSISTED INGUINAL HERNIA REPAIR WITH MESH;  Surgeon: Campbell Lerner, MD;  Location: [facility] ORS;  Service: General;  Laterality: Left;   Social History:  reports that she has never smoked. She has never used smokeless tobacco. She reports that she does not drink alcohol and does not use drugs.  Allergies  Allergen Reactions   Accupril [Quinapril Hcl] Rash   Micardis [Telmisartan] Other (See Comments)    Dizziness   Quinapril Rash    Family History  Problem Relation Age of Onset   Breast cancer Cousin    Skin cancer Other    Diabetes Other    Hypercholesterolemia Other    Hypertension Other    Bladder Cancer Maternal Aunt    COPD Other    Kidney disease Neg Hx    Kidney cancer Neg Hx     Prior to Admission medications   Medication Sig Start Date End Date Taking? Authorizing Provider  acetaminophen (TYLENOL) 500 MG tablet Take 1,000 mg  by mouth 2 (two) times daily.    [provider]  aspirin 81 MG EC tablet Take 81 mg by mouth daily.     [provider]  atorvastatin (LIPITOR) 40 MG tablet Take 40 mg by mouth every evening. 08/05/19   [provider]  budesonide-formoterol (SYMBICORT) 80-4.5 MCG/ACT inhaler Inhale 2 puffs into the lungs 2 (two) times daily.    [provider]  Calcium Carb-Cholecalciferol (CALCIUM CARBONATE+VITAMIN D PO) Take 1 tablet by mouth daily at 6 (six) AM.    [provider]  carboxymethylcellulose (REFRESH PLUS) 0.5 % SOLN Apply 1-2 drops to eye at bedtime.     [provider]  carvedilol (COREG) 12.5 MG tablet Take 1 tablet (12.5 mg total) by mouth 2 (two) times daily. HOLD IF SBP <130 02/06/22   Enedina Finner, MD  Cyanocobalamin (VITAMIN B-12 PO) Take 1 tablet by mouth daily.    [provider]  famotidine (PEPCID) 20 MG tablet Take 20 mg by mouth 2 (two) times daily.    [provider]  ferrous sulfate 325 (65 FE) MG tablet Take 325 mg  by mouth daily with breakfast.    [provider]  gabapentin (NEURONTIN) 400 MG capsule Take 400 mg by mouth 3 (three) times daily. 06/27/21   [provider]  glipiZIDE (GLUCOTROL) 5 MG tablet Take 1 tablet (5 mg total) by mouth daily. Patient taking differently: Take 5 mg by mouth daily before breakfast. 02/06/22   Enedina Finner, MD  Insulin Glargine (LANTUS SOLOSTAR) 100 UNIT/ML Solostar Pen Inject 12 Units into the skin every morning. 07/20/15   [provider]  JANUMET XR 50-1000 MG TB24 Take 1 tablet by mouth 2 (two) times daily. 10/02/18   [provider]  loratadine (CLARITIN) 10 MG tablet Take 10 mg by mouth every morning.    [provider]  losartan (COZAAR) 100 MG tablet Take 100 mg by mouth at bedtime.    [provider]  meclizine (ANTIVERT) 12.5 MG tablet Take 12.5 mg by mouth 3 (three) times daily as needed for dizziness.    [provider]  Melatonin 10 MG TABS Take 1 tablet by mouth at bedtime.    [provider]  mirabegron ER (MYRBETRIQ) 50 MG TB24 tablet Take 1 tablet (50 mg total) by mouth daily. Patient taking differently: Take 50 mg by mouth every morning. 05/04/22   Michiel Cowboy A, PA-C  Multiple Vitamins-Minerals (PRESERVISION/LUTEIN) CAPS Take 1 capsule by mouth 2 (two) times daily. 03/29/20   Pennie Banter, DO  nitroGLYCERIN (NITROLINGUAL) 0.4 MG/SPRAY spray Place 1 spray under the tongue every 5 (five) minutes x 3 doses as needed for chest pain.    [provider]  sodium chloride (OCEAN) 0.65 % nasal spray Place 1 spray into the nose as needed.    [provider]  Verapamil HCl CR 300 MG CP24 Take 1 capsule by mouth at bedtime.    [provider]    Physical Exam: Vitals:   05/10/23 2310 05/10/23 2315 05/10/23 2320 05/10/23 2325  BP:    (!) 231/91  Pulse: 72 73 73 74  Resp:      Temp:      TempSrc:      SpO2: 97% 96% 96% 96%  Weight:      Height:        Physical Exam Vitals and nursing note reviewed.  Constitutional:      General: She is not in acute distress. HENT:  Head: Normocephalic and atraumatic.  Cardiovascular:     Rate and Rhythm: Normal rate and regular rhythm.     Heart sounds: Normal heart sounds.  Pulmonary:     Effort: Pulmonary effort is normal.     Breath sounds: Normal breath sounds.  Abdominal:     Palpations: Abdomen is soft.     Tenderness: There is no abdominal tenderness.  Neurological:     Mental Status: Mental status is at baseline.     Labs on Admission: I have personally reviewed following labs and imaging studies  CBC: Recent Labs  Lab 05/10/23 1811  WBC 6.7  HGB 14.1  HCT 43.3  MCV 85.7  PLT 204   Basic Metabolic Panel: Recent Labs  Lab 05/10/23 1811  NA 139  K 4.0  CL 101  CO2 26  GLUCOSE 125*  BUN 13  CREATININE 0.69  CALCIUM 8.9   GFR: Estimated Creatinine Clearance: 51.3 mL/min (by C-G formula based on SCr of 0.69 mg/dL). Liver Function Tests: No results for input(s): "AST", "ALT", "ALKPHOS", "BILITOT", "PROT", "ALBUMIN" in the last 168 hours. No results for input(s): "LIPASE", "AMYLASE" in the last 168 hours. No results for input(s): "AMMONIA" in the last 168 hours. Coagulation Profile: No results for input(s): "INR", "PROTIME" in the last 168 hours. Cardiac Enzymes: No results for input(s): "CKTOTAL", "CKMB", "CKMBINDEX", "TROPONINI" in the last 168 hours. BNP (last 3 results) No results for input(s): "PROBNP" in the last 8760 hours. HbA1C: No results for input(s): "HGBA1C" in the last 72 hours. CBG: No results for input(s): "GLUCAP" in the last 168 hours. Lipid Profile: No results for input(s): "CHOL", "HDL", "LDLCALC", "TRIG", "CHOLHDL", "LDLDIRECT" in the last 72 hours. Thyroid Function Tests: No results for input(s): "TSH", "T4TOTAL", "FREET4", "T3FREE", "THYROIDAB" in the last 72 hours. Anemia Panel: No results for input(s): "VITAMINB12", "FOLATE",  "FERRITIN", "TIBC", "IRON", "RETICCTPCT" in the last 72 hours. Urine analysis:    Component Value Date/Time   COLORURINE YELLOW (A) 02/22/2023 1459   APPEARANCEUR CLOUDY (A) 02/22/2023 1459   APPEARANCEUR Cloudy (A) 05/04/2022 1107   LABSPEC 1.020 02/22/2023 1459   LABSPEC 1.010 02/22/2012 1204   PHURINE 5.0 02/22/2023 1459   GLUCOSEU NEGATIVE 02/22/2023 1459   GLUCOSEU Negative 02/22/2012 1204   HGBUR NEGATIVE 02/22/2023 1459   BILIRUBINUR NEGATIVE 02/22/2023 1459   BILIRUBINUR Negative 05/04/2022 1107   BILIRUBINUR Negative 02/22/2012 1204   KETONESUR NEGATIVE 02/22/2023 1459   PROTEINUR 30 (A) 02/22/2023 1459   NITRITE NEGATIVE 02/22/2023 1459   LEUKOCYTESUR LARGE (A) 02/22/2023 1459   LEUKOCYTESUR 2+ 02/22/2012 1204    Radiological Exams on Admission: MR BRAIN WO CONTRAST  Result Date: 05/10/2023 CLINICAL DATA:  Initial evaluation for neuro deficit, stroke suspected. EXAM: MRI HEAD WITHOUT CONTRAST TECHNIQUE: Multiplanar, multiecho pulse sequences of the brain and surrounding structures were obtained without intravenous contrast. COMPARISON:  CT from earlier the same day. FINDINGS: Brain: Cerebral volume within normal limits. Patchy T2/FLAIR hyperintensity involving the supratentorial cerebral white matter, consistent with chronic small vessel ischemic disease, moderately advanced. No evidence for acute or subacute ischemia. No acute intracranial hemorrhage. Few chronic micro hemorrhages noted at the pons, likely hypertensive in nature. No mass lesion, midline shift or mass effect. No hydrocephalus or extra-axial fluid collection. Empty sella noted. Vascular: Major intracranial vascular flow voids are maintained. Skull and upper cervical spine: Craniocervical junction normal. Bone marrow signal intensity within normal limits. No scalp soft tissue abnormality. Sinuses/Orbits: Prior bilateral ocular lens replacement. Scattered mucosal thickening present about the  sphenoid ethmoidal  sinuses with small air-fluid level within the left sphenoid sinus. No significant mastoid effusion. Other: None. IMPRESSION: 1. No acute intracranial abnormality. 2. Moderately advanced chronic microvascular ischemic disease. 3. Scattered paranasal sinus disease with small air-fluid level within the left sphenoid sinus. Clinical correlation for possible acute sinusitis recommended. Electronically Signed   By: Rise Mu M.D.   On: 05/10/2023 21:40   CT Head Wo Contrast  Result Date: 05/10/2023 CLINICAL DATA:  Hypertension neurological deficit, initial encounter EXAM: CT HEAD WITHOUT CONTRAST TECHNIQUE: Contiguous axial images were obtained from the base of the skull through the vertex without intravenous contrast. RADIATION DOSE REDUCTION: This exam was performed according to the departmental dose-optimization program which includes automated exposure control, adjustment of the mA and/or kV according to patient size and/or use of iterative reconstruction technique. COMPARISON:  02/02/2022 FINDINGS: Brain: No evidence of acute infarction, hemorrhage, hydrocephalus, extra-axial collection or mass lesion/mass effect. Mild atrophic changes are noted. Mild chronic white matter ischemic changes are seen as well. Vascular: No hyperdense vessel or unexpected calcification. Skull: Normal. Negative for fracture or focal lesion. Sinuses/Orbits: No acute finding. Other: None IMPRESSION: No acute intracranial abnormality noted. Chronic changes as described. Electronically Signed   By: Alcide Clever M.D.   On: 05/10/2023 19:53     Data Reviewed: Relevant notes from primary care and specialist visits, past discharge summaries as available in EHR, including Care Everywhere. Prior diagnostic testing as pertinent to current admission diagnoses Updated medications and problem lists for reconciliation ED course, including vitals, labs, imaging, treatment and response to treatment Triage notes, nursing and pharmacy  notes and ED provider's notes Notable results as noted in HPI   Assessment and Plan: * Hypertensive emergency Patient symptomatic for headache and dizziness MRI brain showing no acute intracranial abnormality, moderately advanced chronic microvascular ischemic disease Continue home losartan, Coreg and verapamil Continue aspirin and atorvastatin Received IV labetalol in the ED As needed IV hydralazine if needed for additional control  Acute sinusitis Peripheral vertigo MRI brain showing paranasal sinus disease and air-fluid level left sphenoid sinus  We will try Flonase, Tylenol  Recurrent syncope History of recurrent syncope but no syncopal events or lightheadedness, just vertigo Fall precautions  A-fib s/p ligation of left atrial appendage Continue verapamil, no longer on systemic anticoagulation  Chronic back pain Spinal stenosis Continue gabapentin and Tylenol  Obesity, Class III, BMI 40-49.9 (morbid obesity) (HCC) Complicating factor to overall prognosis and care  CAD s/p CABG (coronary artery disease) Continue aspirin, atorvastatin and carvedilol  Diabetes mellitus type 2, insulin dependent (HCC) Continue basal insulin Sliding scale insulin coverage  OSA on CPAP CPAP nightly        DVT prophylaxis: Lovenox  Consults: none  Advance Care Planning:   Code Status: Prior   Family Communication: son at bedside  Disposition Plan: Back to previous home environment  Severity of Illness: The appropriate patient status for this patient is OBSERVATION. Observation status is judged to be reasonable and necessary in order to provide the required intensity of service to ensure the patient's safety. The patient's presenting symptoms, physical exam findings, and initial radiographic and laboratory data in the context of their medical condition is felt to place them at decreased risk for further clinical deterioration. Furthermore, it is anticipated that the patient will  be medically stable for discharge from the hospital within 2 midnights of admission.   Author: Andris Baumann, MD 05/10/2023 11:52 PM  For on call review [email or web address].

## 2023-05-10 NOTE — ED Triage Notes (Addendum)
Pt reports dizziness when asked. +visual changes per [facility]

## 2023-05-10 NOTE — Assessment & Plan Note (Signed)
Continue basal insulin.  Sliding scale insulin coverage

## 2023-05-10 NOTE — ED Notes (Signed)
Patient transported to CT 

## 2023-05-10 NOTE — Assessment & Plan Note (Signed)
Spinal stenosis Continue gabapentin and Tylenol

## 2023-05-10 NOTE — Assessment & Plan Note (Signed)
CPAP nightly

## 2023-05-10 NOTE — Assessment & Plan Note (Signed)
History of recurrent syncope but no syncopal events or lightheadedness, just vertigo Fall precautions

## 2023-05-10 NOTE — ED Notes (Signed)
Pt back from imaging

## 2023-05-10 NOTE — ED Notes (Addendum)
MRI screen patient at this time

## 2023-05-10 NOTE — Assessment & Plan Note (Signed)
Peripheral vertigo MRI brain showing paranasal sinus disease and air-fluid level left sphenoid sinus  We will try Flonase, Tylenol

## 2023-05-10 NOTE — Assessment & Plan Note (Signed)
Complicating factor to overall prognosis and care 

## 2023-05-10 NOTE — ED Provider Notes (Signed)
[facility] Provider Note    Event Date/Time   First MD Initiated Contact with Patient 05/10/23 1829     (approximate)   History   Hypertension and Dizziness   HPI  Brandi [name] is a 87 y.o. female   Past medical history of chronic back pain, hypertension, CAD with prior CABG, atrial fibrillation not on blood thinner, here with dizziness.  She has been getting rehab for chronic back pain in anticipation for back surgery, longstanding, when she noted to her rehab therapist that she has been feeling vertigo, sensation of room spinning, imbalance for the past 1 week.  Per the patient they attempted " putting stones back in place" with the maneuver but did not see improvement and patient notes that " my eyes did not do what they expected " --she was noted to have markedly elevated blood pressure, noted intermittent blurry vision for the last couple of weeks, and a frontal headache, was seen by her primary doctor and then sent to the emergency department for further evaluation.  She states that she has had no trauma.  She has been taking her antihypertensives as prescribed.  At rest she does not have a sense of imbalance or dizziness.  However upon standing she feels like she is off balance and feels the room is spinning.  She has associated nausea.  Independent Historian contributed to assessment above: Her husband is at bedside to corroborate information and past medical history as above  External Medical Documents Reviewed: Office visit from internal medicine earlier today noting physical therapy, vertigo, high blood pressure      Physical Exam   Triage Vital Signs: ED Triage Vitals  Encounter Vitals Group     BP 05/10/23 1809 (!) 226/115     Systolic BP Percentile --      Diastolic BP Percentile --      Pulse Rate 05/10/23 1809 90     Resp 05/10/23 1806 16     Temp 05/10/23 1806 97.7 F (36.5 C)     Temp src --      SpO2 05/10/23 1809 96 %      Weight 05/10/23 1807 190 lb (86.2 kg)     Height 05/10/23 1807 5\' 1"  (1.549 m)     Head Circumference --      Peak Flow --      Pain Score 05/10/23 1807 5     Pain Loc --      Pain Education --      Exclude from Growth Chart --     Most recent vital signs: Vitals:   05/10/23 1806 05/10/23 1809  BP:  (!) 226/115  Pulse:  90  Resp: 16   Temp: 97.7 F (36.5 C)   SpO2:  96%    General: Awake, no distress. CV:  Good peripheral perfusion.  Resp:  Normal effort.  Abd:  No distention.  Other:  Markedly hypertensive 220s over 115.  Pleasant woman in no acute distress, no facial asymmetry, dysarthria, no nystagmus, finger-to-nose is normal, no motor or sensory deficits but upon standing she feels a sense of imbalance and starts to sway back-and-forth needing to sit down.   ED Results / Procedures / Treatments   Labs (all labs ordered are listed, but only abnormal results are displayed) Labs Reviewed  BASIC METABOLIC PANEL - Abnormal; Notable for the following components:      Result Value   Glucose, Bld 125 (*)    All other  components within normal limits  CBC     I ordered and reviewed the above labs they are notable for cell counts and electrolytes unremarkable.  EKG  ED ECG REPORT I, Pilar Jarvis, the attending physician, personally viewed and interpreted this ECG.   Date: 05/10/2023  EKG Time: 1815  Rate: sinus  Rhythm: nl  Axis: none  Intervals:none  ST&T Change: no stemi    RADIOLOGY I independently reviewed and interpreted CT scan of the head see no obvious bleeding or midline shift I also reviewed radiologist's formal read.   PROCEDURES:  Critical Care performed: Yes, see critical care procedure note(s)  .Critical Care  Performed by: Pilar Jarvis, MD Authorized by: Pilar Jarvis, MD   Critical care provider statement:    Critical care time (minutes):  30   Critical care was time spent personally by me on the following activities:  Development of  treatment plan with patient or surrogate, discussions with consultants, evaluation of patient's response to treatment, examination of patient, ordering and review of laboratory studies, ordering and review of radiographic studies, ordering and performing treatments and interventions, pulse oximetry, re-evaluation of patient's condition and review of old charts    MEDICATIONS ORDERED IN ED: Medications  labetalol (NORMODYNE) injection 10 mg (has no administration in time range)    External physician / consultants:  I spoke with hospitalist for admission and regarding care plan for this patient.   IMPRESSION / MDM / ASSESSMENT AND PLAN / ED COURSE  I reviewed the triage vital signs and the nursing notes.                                Patient's presentation is most consistent with acute presentation with potential threat to life or bodily function.  Differential diagnosis includes, but is not limited to, peripheral versus central vertigo, hypertensive crisis   The patient is on the cardiac monitor to evaluate for evidence of arrhythmia and/or significant heart rate changes.  MDM:    This is a patient with 1 week of symptoms and thus is outside of the thrombolytic window with symptoms of imbalance/vertigo as well as nausea and vomiting who is markedly hypertensive concerning for central vertigo posterior stroke.  Get CT head rule out head bleed, MRI for stroke.  -- Stroke negative.  Markedly hypertensive now 250/120, medical chart review shows that evening medications only Coreg 12.5 mg from last year.  Unlikely that this medication is going to bring her back down to normal.  Given her ongoing sense of imbalance, admission for hypertensive urgency.  Will give IV labetalol 10 mg initial dosing and recheck blood pressure.     FINAL CLINICAL IMPRESSION(S) / ED DIAGNOSES   Final diagnoses:  Dizziness  Hypertensive crisis     Rx / DC Orders   ED Discharge Orders     None         Note:  This document was prepared using Dragon voice recognition software and may include unintentional dictation errors.    Pilar Jarvis, MD 05/10/23 2252

## 2023-05-10 NOTE — Assessment & Plan Note (Signed)
Continue verapamil, no longer on systemic anticoagulation

## 2023-05-10 NOTE — Assessment & Plan Note (Addendum)
Patient symptomatic for headache and dizziness MRI brain showing no acute intracranial abnormality, moderately advanced chronic microvascular ischemic disease Continue home losartan, Coreg and verapamil Continue aspirin and atorvastatin Received IV labetalol in the ED As needed IV hydralazine if needed for additional control

## 2023-05-10 NOTE — ED Triage Notes (Signed)
Pt to ED from [facility] for HTN, reports hx of same, took meds this am. Denies chest pain. + h/a

## 2023-05-11 ENCOUNTER — Observation Stay (HOSPITAL_COMMUNITY)
Admit: 2023-05-11 | Discharge: 2023-05-11 | Disposition: A | Discharge status: Home / Self Care | Payer: Medicare PPO | Attending: Internal Medicine | Admitting: Internal Medicine

## 2023-05-11 ENCOUNTER — Encounter: Payer: Self-pay | Admitting: Internal Medicine

## 2023-05-11 DIAGNOSIS — I428 Other cardiomyopathies: Secondary | ICD-10-CM

## 2023-05-11 DIAGNOSIS — I161 Hypertensive emergency: Secondary | ICD-10-CM | POA: Diagnosis not present

## 2023-05-11 LAB — URINALYSIS, COMPLETE (UACMP) WITH MICROSCOPIC
Bacteria, UA: NONE SEEN
Bilirubin Urine: NEGATIVE
Glucose, UA: NEGATIVE mg/dL
Hgb urine dipstick: NEGATIVE
Ketones, ur: 5 mg/dL — AB
Leukocytes,Ua: NEGATIVE
Nitrite: NEGATIVE
Protein, ur: NEGATIVE mg/dL
Specific Gravity, Urine: 1.009 (ref 1.005–1.030)
Squamous Epithelial / HPF: 0 /[HPF] (ref 0–5)
pH: 7 (ref 5.0–8.0)

## 2023-05-11 LAB — HEMOGLOBIN A1C
Hgb A1c MFr Bld: 6.5 % — ABNORMAL HIGH (ref 4.8–5.6)
Mean Plasma Glucose: 139.85 mg/dL

## 2023-05-11 LAB — ECHOCARDIOGRAM COMPLETE
AR max vel: 2.87 cm2
AV Area VTI: 2.27 cm2
AV Area mean vel: 2.45 cm2
AV Mean grad: 4 mm[Hg]
AV Peak grad: 7.6 mm[Hg]
Ao pk vel: 1.38 m/s
Area-P 1/2: 2.45 cm2
Height: 61 in
MV VTI: 2.67 cm2
S' Lateral: 2 cm
Weight: 3040 [oz_av]

## 2023-05-11 LAB — CBG MONITORING, ED
Glucose-Capillary: 108 mg/dL — ABNORMAL HIGH (ref 70–99)
Glucose-Capillary: 92 mg/dL (ref 70–99)
Glucose-Capillary: 148 mg/dL — ABNORMAL HIGH (ref 70–99)
Glucose-Capillary: 194 mg/dL — ABNORMAL HIGH (ref 70–99)
Glucose-Capillary: 116 mg/dL — ABNORMAL HIGH (ref 70–99)

## 2023-05-11 LAB — TSH: TSH: 2.348 u[IU]/mL (ref 0.350–4.500)

## 2023-05-11 MED ORDER — GLUCAGON HCL RDNA (DIAGNOSTIC) 1 MG IJ SOLR: 1.0000 mg | INTRAMUSCULAR | Status: DC | PRN

## 2023-05-11 MED ORDER — SENNOSIDES-DOCUSATE SODIUM 8.6-50 MG PO TABS: 1.0000 | ORAL_TABLET | Freq: Every evening | ORAL | Status: DC | PRN

## 2023-05-11 MED ORDER — GABAPENTIN 300 MG PO CAPS
300.0000 mg | ORAL_CAPSULE | Freq: Three times a day (TID) | ORAL | Status: DC
  Administered 2023-05-11 – 2023-05-13 (×8): 300 mg via ORAL
  Filled 2023-05-11 (×8): qty 1

## 2023-05-11 MED ORDER — HYDRALAZINE HCL 20 MG/ML IJ SOLN
10.0000 mg | INTRAMUSCULAR | Status: DC | PRN
  Administered 2023-05-12 (×2): 10 mg via INTRAVENOUS
  Filled 2023-05-11 (×2): qty 1

## 2023-05-11 MED ORDER — METOPROLOL TARTRATE 5 MG/5ML IV SOLN: 5.0000 mg | INTRAVENOUS | Status: DC | PRN

## 2023-05-11 MED ORDER — SODIUM CHLORIDE 0.9 % IV SOLN: INTRAVENOUS | Status: AC

## 2023-05-11 NOTE — ED Notes (Signed)
Pt alert, NAD, calm, interactive, family at [facility]. PT working with pt at this time.

## 2023-05-11 NOTE — Progress Notes (Signed)
*  PRELIMINARY RESULTS* Echocardiogram 2D Echocardiogram has been performed.  Carolyne Fiscal 05/11/2023, 3:21 PM

## 2023-05-11 NOTE — Evaluation (Signed)
Occupational Therapy Evaluation Patient Details Name: Brandi [name] MRN: [id number] DOB: 1935/07/01 Today's Date: 05/11/2023   History of Present Illness 87 y.o. female presenting with dizziness x 1 week and hypertensive emergency, blurred vision and a headache. She denies nausea or vomiting, one-sided weakness, numbness or tingling. MRI negative. PMH: Recurrent vasovagal syncope, recurrent UTI, DM, paroxysmal A-fib s/p LAA clipping, CAD s/p CABG and LAD stent, PUD, spinal stenosis, OSA on CPAP and recurrent UTI.   Clinical Impression   Pt was seen for OT evaluation this date. Prior to hospital admission, pt lives with her son who assists her with LB ADLs and toilet transfers, as well as mobility without AD in narrow doorways where pt is not able to use rollator. She uses rollator throughout the home with IND. Son manages IADLs.  Pt presents to acute OT demonstrating impaired ADL performance and functional mobility 2/2 weakness, dizziness, pain, and mild balance deficits (See OT problem list for additional functional deficits). Pt currently requires SUP for supine to sit at EOB, CGA via HHA for STS from high gurney. CGA via HHA x2 for short distance mobility to the toilet. Toilet transfer and clothing management performed with CGA and use of grab bar as needed. BP 180/62 in supine, increased to 183/82 following toileting. Stood at sink for Special educational needs teacher with CGA, HR up to 125 and pt reporting nausea. Denies dizziness throughout eval. Returned to supine with Min/Mod A for BLE management onto high gurney. Nurse notified of BP and HR during session. Pt would benefit from skilled OT services to address noted impairments and functional limitations (see below for any additional details) in order to maximize safety and independence while minimizing falls risk and caregiver burden. Do anticipate the need for follow up OT services upon acute hospital DC.        If plan is discharge home, recommend the  following: A little help with walking and/or transfers;A little help with bathing/dressing/bathroom;Help with stairs or ramp for entrance;Assist for transportation;Assistance with cooking/housework    Functional Status Assessment  Patient has had a recent decline in their functional status and demonstrates the ability to make significant improvements in function in a reasonable and predictable amount of time.  Equipment Recommendations  BSC/3in1    Recommendations for Other Services       Precautions / Restrictions Precautions Precautions: Fall Restrictions Weight Bearing Restrictions: No      Mobility Bed Mobility Overal bed mobility: Needs Assistance Bed Mobility: Supine to Sit, Sit to Supine     Supine to sit: Supervision, HOB elevated, Used rails Sit to supine: Min assist, Used rails, Mod assist   General bed mobility comments: SUP for supine to sit at EOB, Min/Mod A for BLE management to return to bed    Transfers Overall transfer level: Needs assistance Equipment used: 1 person hand held assist Transfers: Sit to/from Stand Sit to Stand: Contact guard assist           General transfer comment: CGA via HHA for STS from EOB and mobility to the toilet and back      Balance Overall balance assessment: Needs assistance Sitting-balance support: Feet supported Sitting balance-Leahy Scale: Good       Standing balance-Leahy Scale: Fair Standing balance comment: HHA from therapist and able to stand at sink to wash hands with no LOB;                           ADL either  performed or assessed with clinical judgement   ADL Overall ADL's : Needs assistance/impaired     Grooming: Wash/dry hands;Standing;Contact guard assist                   Toilet Transfer: Contact guard assist;Grab bars Statistician Details (indicate cue type and reason): HHA to simulate what her and son do at home Toileting- Architect and Hygiene: Contact guard  assist;Sit to/from Horticulturist, commercial         Pertinent Vitals/Pain Pain Assessment Pain Assessment: 0-10 Pain Score: 2  Pain Location: headache and chronic back pain Pain Descriptors / Indicators: Aching Pain Intervention(s): Monitored during session, Limited activity within patient's tolerance     Extremity/Trunk Assessment Upper Extremity Assessment Upper Extremity Assessment: Overall WFL for tasks assessed   Lower Extremity Assessment Lower Extremity Assessment: Overall WFL for tasks assessed       Communication Communication Communication: No apparent difficulties   Cognition Arousal: Alert Behavior During Therapy: WFL for tasks assessed/performed Overall Cognitive Status: Within Functional Limits for tasks assessed                                       General Comments  HR up to 125 following toileting tasks, BP 180/62 in supine, 183/82 following toileting; pt denies dizziness throughtout session, nausea when HR elevated to 125    Exercises Other Exercises Other Exercises: Edu on role of OT in acute setting and importance to maximize her safety, activity tolerance and IND to return to PLOF.   Shoulder Instructions      Home Living Family/patient expects to be discharged to:: Private residence Living Arrangements: Children (son) Available Help at Discharge: Family;Available 24 hours/day Type of Home: House Home Access: Stairs to enter [employer] of Steps: 3 Entrance Stairs-Rails: Right Home Layout: Two level;Able to live on main level with bedroom/bathroom     Bathroom Shower/Tub: Producer, television/film/video: Handicapped height     Home Equipment: Rollator (4 wheels);Shower seat          Prior Functioning/Environment Prior Level of Function : Needs assist       Physical Assist : ADLs (physical)   ADLs (physical): Bathing;Dressing;Toileting;IADLs Mobility  Comments: rollator use in the home with IND; but has son to assist with walking in/out of house, into the bathroom where rollator will not fit ADLs Comments: son assists with LB dressing and toileting as needed        OT Problem List: Decreased activity tolerance      OT Treatment/Interventions: Self-care/ADL training;Therapeutic exercise;Therapeutic activities;Energy conservation;DME and/or AE instruction;Patient/family education;Balance training    OT Goals(Current goals can be found in the care plan section) Acute Rehab OT Goals Patient Stated Goal: return home OT Goal Formulation: With patient Time For Goal Achievement: 05/25/23 Potential to Achieve Goals: Good ADL Goals Pt Will Perform Lower Body Bathing: with supervision;sitting/lateral leans;sit to/from stand Pt Will Perform Lower Body Dressing: with min assist;sitting/lateral leans;sit to/from stand Pt Will Transfer to Toilet: with contact guard assist;ambulating;regular height toilet;grab bars Pt Will Perform Toileting - Clothing Manipulation and hygiene: sit to/from stand;sitting/lateral leans;with supervision Additional ADL Goal #1: Pt will demo/verbalize use of ECS and safety strategies for dizziness to prevent  falls and maximize safety upon return home.  OT Frequency: Min 1X/week    Co-evaluation              AM-PAC OT "6 Clicks" Daily Activity     Outcome Measure Help from another person eating meals?: None Help from another person taking care of personal grooming?: None Help from another person toileting, which includes using toliet, bedpan, or urinal?: A Little Help from another person bathing (including washing, rinsing, drying)?: A Little Help from another person to put on and taking off regular upper body clothing?: None Help from another person to put on and taking off regular lower body clothing?: A Little 6 Click Score: 21   End of Session Nurse Communication: Mobility status  Activity Tolerance:  Patient tolerated treatment well Patient left: in bed;with call bell/phone within reach;with nursing/sitter in room  OT Visit Diagnosis: Other abnormalities of gait and mobility (R26.89)                Time: 9604-5409 OT Time Calculation (min): 26 min Charges:  OT General Charges $OT Visit: 1 Visit OT Evaluation $OT Eval Low Complexity: 1 Low OT Treatments $Self Care/Home Management : 8-22 mins Yakelin Grenier, OTR/L 05/11/23, 12:23 PM  Ameris Akamine E Devri Kreher 05/11/2023, 12:18 PM

## 2023-05-11 NOTE — ED Notes (Signed)
Pt toileted and helped to hooked back up to monitor. Pt stated she was heading to bed.

## 2023-05-11 NOTE — ED Notes (Signed)
Denies sx or complaints. Denies pain, sob, nausea, HA, or dizziness. Family at [facility]. Pt alert, NAD, calm, interactive, resps e/u, speaking in clear complete sentences. Family at [facility].

## 2023-05-11 NOTE — ED Notes (Signed)
Per last shift RN Pt is supposed to be on a continuous infusion of 50 mL/hr Sodium Chloride but got a bolus of it instead, so held off on continuous because she got such a high volume in such a short amount of time. This RN let Attending know.

## 2023-05-11 NOTE — ED Notes (Signed)
Waiting on medication to come from pharmacy.

## 2023-05-11 NOTE — Progress Notes (Signed)
Anticoagulation monitoring(Lovenox):  87 yo female ordered Lovenox 40 mg Q24h    Filed Weights   05/10/23 1807  Weight: 86.2 kg (190 lb)   BMI 35   Lab Results  Component Value Date   CREATININE 0.69 05/10/2023   CREATININE 0.86 03/04/2023   CREATININE 0.83 02/22/2023   Estimated Creatinine Clearance: 51.3 mL/min (by C-G formula based on SCr of 0.69 mg/dL). Hemoglobin & Hematocrit     Component Value Date/Time   HGB 14.1 05/10/2023 1811   HGB 13.3 02/22/2012 1233   HCT 43.3 05/10/2023 1811   HCT 39.4 02/22/2012 1233     Per Protocol for Patient with estCrcl > 30 ml/min and BMI > 30, will transition to Lovenox 42.5 mg Q24h.

## 2023-05-11 NOTE — Progress Notes (Signed)
PROGRESS NOTE    Brandi [name]  BTD:[id number] DOB: [birth year] DOA: 05/10/2023 PCP: Gracelyn Nurse, MD     Brief Narrative:  87 year old with history of recurrent vasovagal syncope, recurrent UTI, DM2, paroxysmal A-fib status post LAA clipping, CAD status post CABG, LAD stent, PVD, spinal stenosis, OSA on CPAP, recurrent UTI presented with dizziness and hypertensive emergency for 1 week.  Upon admission systolic blood pressure greater than 200, EKG unremarkable.  MRI brain showed chronic changes.   Assessment & Plan:  Principal Problem:   Hypertensive emergency Active Problems:   Peripheral vertigo   Acute sinusitis   Recurrent syncope   A-fib s/p ligation of left atrial appendage   Depression, major, in remission (HCC)   OSA on CPAP   Diabetes mellitus type 2, insulin dependent (HCC)   CAD s/p CABG (coronary artery disease)   Obesity, Class III, BMI 40-49.9 (morbid obesity) (HCC)   Chronic back pain     Hypertensive emergency, improving Essential hypertension Symptomatic with headache and dizziness.  MRI negative for acute pathology but did show chronic changes.  Currently Coreg, losartan, verapamil have been restarted.  Blood pressure seems to have improved.  IV as needed   Acute sinusitis Peripheral vertigo MRI brain showing paranasal sinus disease and air-fluid level left sphenoid sinus    Recurrent syncope History of recurrent syncope but no syncopal events or lightheadedness, just vertigo.  Last echo in 2023, will repeat given extensive cardiac history Fall precautions   A-fib s/p ligation of left atrial appendage Continue verapamil, no longer on systemic anticoagulation   Chronic back pain Spinal stenosis Continue gabapentin and Tylenol   Obesity, Class III, BMI 40-49.9 (morbid obesity) (HCC) Complicating factor to overall prognosis and care   CAD s/p CABG (coronary artery disease) Continue aspirin, atorvastatin and carvedilol.  IV as needed   Diabetes  mellitus type 2, insulin dependent (HCC) Continue basal insulin Sliding scale insulin coverage   OSA on CPAP CPAP nightly   PT/OT  DVT prophylaxis: Lovenox Code Status: Full code Family Communication: Friend at bedside Ongoing management for dizziness and evaluation 5.    Subjective: During my visit advised she has no complaints.  Does admit that with change in position she gets dizzy.  Sometimes it has very severe.  Yesterday dizziness started as she started working with therapy team.   Examination:  General exam: Appears calm and comfortable  Respiratory system: Clear to auscultation. Respiratory effort normal. Cardiovascular system: S1 & S2 heard, RRR. No JVD, murmurs, rubs, gallops or clicks. No pedal edema. Gastrointestinal system: Abdomen is nondistended, soft and nontender. No organomegaly or masses felt. Normal bowel sounds heard. Central nervous system: Alert and oriented. No focal neurological deficits. Extremities: Symmetric 5 x 5 power. Skin: No rashes, lesions or ulcers Psychiatry: Judgement and insight appear normal. Mood & affect appropriate.                  Diet Orders (From admission, onward)     Start     Ordered   05/10/23 2357  Diet heart healthy/carb modified Room service appropriate? Yes; Fluid consistency: Thin  Diet effective now       Question Answer Comment  Diet-HS Snack? Nothing   Room service appropriate? Yes   Fluid consistency: Thin      05/10/23 2357            Objective: Vitals:   05/11/23 1100 05/11/23 1114 05/11/23 1130 05/11/23 1200  BP:   (!) 142/63 (!) 170/98  Pulse: 71  71 75  Resp:      Temp:  98.1 F (36.7 C)    TempSrc:  Oral    SpO2: 97%  95% 98%  Weight:      Height:       No intake or output data in the 24 hours ending 05/11/23 1344 Filed Weights   05/10/23 1807  Weight: 86.2 kg    Scheduled Meds:  aspirin EC  81 mg Oral Daily   atorvastatin  40 mg Oral QPM   carvedilol  12.5 mg Oral BID  WC   enoxaparin (LOVENOX) injection  0.5 mg/kg Subcutaneous Q24H   fluticasone  1 spray Each Nare Daily   gabapentin  300 mg Oral TID   insulin aspart  0-15 Units Subcutaneous TID WC   insulin aspart  0-5 Units Subcutaneous QHS   insulin glargine-yfgn  10 Units Subcutaneous Daily   loratadine  10 mg Oral BH-q7a   losartan  100 mg Oral QHS   melatonin  10 mg Oral QHS   mirabegron ER  50 mg Oral Daily   verapamil  240 mg Oral QHS   Continuous Infusions:  Nutritional status     Body mass index is 35.9 kg/m.  Data Reviewed:   CBC: Recent Labs  Lab 05/10/23 1811  WBC 6.7  HGB 14.1  HCT 43.3  MCV 85.7  PLT 204   Basic Metabolic Panel: Recent Labs  Lab 05/10/23 1811  NA 139  K 4.0  CL 101  CO2 26  GLUCOSE 125*  BUN 13  CREATININE 0.69  CALCIUM 8.9   GFR: Estimated Creatinine Clearance: 51.3 mL/min (by C-G formula based on SCr of 0.69 mg/dL). Liver Function Tests: No results for input(s): "AST", "ALT", "ALKPHOS", "BILITOT", "PROT", "ALBUMIN" in the last 168 hours. No results for input(s): "LIPASE", "AMYLASE" in the last 168 hours. No results for input(s): "AMMONIA" in the last 168 hours. Coagulation Profile: No results for input(s): "INR", "PROTIME" in the last 168 hours. Cardiac Enzymes: No results for input(s): "CKTOTAL", "CKMB", "CKMBINDEX", "TROPONINI" in the last 168 hours. BNP (last 3 results) No results for input(s): "PROBNP" in the last 8760 hours. HbA1C: Recent Labs    05/10/23 1811  HGBA1C 6.5*   CBG: Recent Labs  Lab 05/11/23 0104 05/11/23 0802 05/11/23 1123  GLUCAP 108* 92 148*   Lipid Profile: No results for input(s): "CHOL", "HDL", "LDLCALC", "TRIG", "CHOLHDL", "LDLDIRECT" in the last 72 hours. Thyroid Function Tests: Recent Labs    05/10/23 1811  TSH 2.348   Anemia Panel: No results for input(s): "VITAMINB12", "FOLATE", "FERRITIN", "TIBC", "IRON", "RETICCTPCT" in the last 72 hours. Sepsis Labs: No results for input(s):  "PROCALCITON", "LATICACIDVEN" in the last 168 hours.  No results found for this or any previous visit (from the past 240 hour(s)).       Radiology Studies: MR BRAIN WO CONTRAST  Result Date: 05/10/2023 CLINICAL DATA:  Initial evaluation for neuro deficit, stroke suspected. EXAM: MRI HEAD WITHOUT CONTRAST TECHNIQUE: Multiplanar, multiecho pulse sequences of the brain and surrounding structures were obtained without intravenous contrast. COMPARISON:  CT from earlier the same day. FINDINGS: Brain: Cerebral volume within normal limits. Patchy T2/FLAIR hyperintensity involving the supratentorial cerebral white matter, consistent with chronic small vessel ischemic disease, moderately advanced. No evidence for acute or subacute ischemia. No acute intracranial hemorrhage. Few chronic micro hemorrhages noted at the pons, likely hypertensive in nature. No mass lesion, midline shift or mass effect. No hydrocephalus or extra-axial fluid collection. Empty sella  noted. Vascular: Major intracranial vascular flow voids are maintained. Skull and upper cervical spine: Craniocervical junction normal. Bone marrow signal intensity within normal limits. No scalp soft tissue abnormality. Sinuses/Orbits: Prior bilateral ocular lens replacement. Scattered mucosal thickening present about the sphenoid ethmoidal sinuses with small air-fluid level within the left sphenoid sinus. No significant mastoid effusion. Other: None. IMPRESSION: 1. No acute intracranial abnormality. 2. Moderately advanced chronic microvascular ischemic disease. 3. Scattered paranasal sinus disease with small air-fluid level within the left sphenoid sinus. Clinical correlation for possible acute sinusitis recommended. Electronically Signed   By: Rise Mu M.D.   On: 05/10/2023 21:40   CT Head Wo Contrast  Result Date: 05/10/2023 CLINICAL DATA:  Hypertension neurological deficit, initial encounter EXAM: CT HEAD WITHOUT CONTRAST TECHNIQUE:  Contiguous axial images were obtained from the base of the skull through the vertex without intravenous contrast. RADIATION DOSE REDUCTION: This exam was performed according to the departmental dose-optimization program which includes automated exposure control, adjustment of the mA and/or kV according to patient size and/or use of iterative reconstruction technique. COMPARISON:  02/02/2022 FINDINGS: Brain: No evidence of acute infarction, hemorrhage, hydrocephalus, extra-axial collection or mass lesion/mass effect. Mild atrophic changes are noted. Mild chronic white matter ischemic changes are seen as well. Vascular: No hyperdense vessel or unexpected calcification. Skull: Normal. Negative for fracture or focal lesion. Sinuses/Orbits: No acute finding. Other: None IMPRESSION: No acute intracranial abnormality noted. Chronic changes as described. Electronically Signed   By: Alcide Clever M.D.   On: 05/10/2023 19:53           LOS: 0 days   Time spent= 35 mins    Miguel Rota, MD [facility]  If 7PM-7AM, please contact night-coverage  05/11/2023, 1:44 PM

## 2023-05-11 NOTE — Evaluation (Signed)
Physical Therapy Evaluation Patient Details Name: Brandi [name] MRN: [id number] DOB: [birth year] Today's Date: 05/11/2023  History of Present Illness  87 y.o. female presenting with dizziness x 1 week and hypertensive emergency, blurred vision and a headache. She denies nausea or vomiting, one-sided weakness, numbness or tingling. MRI negative. PMH: Recurrent vasovagal syncope, recurrent UTI, DM, paroxysmal A-fib s/p LAA clipping, CAD s/p CABG and LAD stent, PUD, spinal stenosis, OSA on CPAP and recurrent UTI.   Clinical Impression  Pt received in bed with son at bedside and agreed to PT session. Pt reports that she currently does not have any pain/discomforts. Pt performed bed mobility sit>supine SUP and supine>sit MinA for BLE management, STS with the use of RW (2wheels) CGA, and amb to bathroom with RW CGA. While seated EOB pt reported having a headache that decreased after standing up and amb. Pt demonstrated fair standing balance while completing all toileting IND. SpO2 on RA vitals read 92-96% throughout session and 154/89 mmHg while seated EOB at the end of session. Pt tolerated Tx well and will continue to benefit from skilled PT sessions to improve functional mobility, activity tolerance and strength to maximize safety/IND following D/C.      If plan is discharge home, recommend the following: A little help with walking and/or transfers;Assist for transportation;Help with stairs or ramp for entrance   Can travel by private vehicle        Equipment Recommendations None recommended by PT  Recommendations for Other Services       Functional Status Assessment Patient has had a recent decline in their functional status and demonstrates the ability to make significant improvements in function in a reasonable and predictable amount of time.     Precautions / Restrictions Precautions Precautions: Fall Restrictions Weight Bearing Restrictions: No      Mobility  Bed Mobility Overal  bed mobility: Needs Assistance Bed Mobility: Supine to Sit, Sit to Supine     Supine to sit: Supervision, HOB elevated, Used rails Sit to supine: Min assist, Used rails, Mod assist   General bed mobility comments: Pt requiored SUP-MinA for bed mobility. Supine>sit while seated EOB, pt reported having a headache. For sit>supine pt needed assistance with BLE management. VC necessary for bed mobility technique.    Transfers Overall transfer level: Needs assistance Equipment used: Rolling walker (2 wheels) Transfers: Sit to/from Stand Sit to Stand: Contact guard assist           General transfer comment: Pt performed STS with the use of RW (2wheels) CGA and did not report any s/sx relative to dizziness.    Ambulation/Gait Ambulation/Gait assistance: Contact guard assist Gait Distance (Feet): 50 Feet Assistive device: Rolling walker (2 wheels) Gait Pattern/deviations: Step-through pattern Gait velocity: wnl     General Gait Details: Pt amb with the use of RW (2wheels) CGA and did not report any s/sx relative to dizziness.  Stairs            Wheelchair Mobility     Tilt Bed    Modified Rankin (Stroke Patients Only)       Balance Overall balance assessment: Needs assistance Sitting-balance support: Feet supported Sitting balance-Leahy Scale: Good     Standing balance support: Bilateral upper extremity supported, During functional activity Standing balance-Leahy Scale: Good                               Pertinent Vitals/Pain Pain Assessment Pain Assessment: No/denies  pain Pain Location: headache and chronic back pain Pain Descriptors / Indicators: Aching Pain Intervention(s): Monitored during session    Home Living Family/patient expects to be discharged to:: Private residence Living Arrangements: Children (Son) Available Help at Discharge: Family;Available 24 hours/day Type of Home: House Home Access: Stairs to enter Entrance  Stairs-Rails: Right Entrance Stairs-Number of Steps: 3   Home Layout: Two level;Able to live on main level with bedroom/bathroom Home Equipment: Rollator (4 wheels);Shower seat      Prior Function Prior Level of Function : Needs assist       Physical Assist : ADLs (physical)   ADLs (physical): Bathing;Dressing;Toileting;IADLs Mobility Comments: rollator use in the home with IND; but has son to assist with walking in/out of house, into the bathroom where rollator will not fit ADLs Comments: son assists with LB dressing and toileting as needed     Extremity/Trunk Assessment   Upper Extremity Assessment Upper Extremity Assessment: Overall WFL for tasks assessed    Lower Extremity Assessment Lower Extremity Assessment: Generalized weakness       Communication   Communication Communication: No apparent difficulties Cueing Techniques: Verbal cues  Cognition Arousal: Alert Behavior During Therapy: WFL for tasks assessed/performed Overall Cognitive Status: Within Functional Limits for tasks assessed                                 General Comments: AOx4. Pt pleasant and willing to participate in PT session.        General Comments General comments (skin integrity, edema, etc.): HR up to 125 following toileting tasks, BP 180/62 in supine, 183/82 following toileting; pt denies dizziness throughtout session, nausea when HR elevated to 125    Exercises     Assessment/Plan    PT Assessment Patient needs continued PT services  PT Problem List Decreased activity tolerance;Pain       PT Treatment Interventions Functional mobility training;Gait training;DME instruction    PT Goals (Current goals can be found in the Care Plan section)  Acute Rehab PT Goals Patient Stated Goal: None stated PT Goal Formulation: With patient Time For Goal Achievement: 05/25/23 Potential to Achieve Goals: Good    Frequency Min 1X/week     Co-evaluation                AM-PAC PT "6 Clicks" Mobility  Outcome Measure Help needed turning from your back to your side while in a flat bed without using bedrails?: None Help needed moving from lying on your back to sitting on the side of a flat bed without using bedrails?: A Little Help needed moving to and from a bed to a chair (including a wheelchair)?: A Little Help needed standing up from a chair using your arms (e.g., wheelchair or bedside chair)?: A Little Help needed to walk in hospital room?: A Little Help needed climbing 3-5 steps with a railing? : A Lot 6 Click Score: 18    End of Session Equipment Utilized During Treatment: Gait belt Activity Tolerance: Patient tolerated treatment well Patient left: in bed;with call bell/phone within reach;with family/visitor present Nurse Communication: Mobility status PT Visit Diagnosis: Other abnormalities of gait and mobility (R26.89);Difficulty in walking, not elsewhere classified (R26.2)    Time: 2440-1027 PT Time Calculation (min) (ACUTE ONLY): 25 min   Charges:   PT Evaluation $PT Eval Low Complexity: 1 Low PT Treatments $Gait Training: 8-22 mins PT General Charges $$ ACUTE PT VISIT: 1 Visit  Dailyn Kempner [employer] SPT, LAT, ATC  Artasia Thang [place] 05/11/2023, 3:06 PM

## 2023-05-11 NOTE — ED Notes (Signed)
PT at bedside, working with pt.

## 2023-05-11 NOTE — Hospital Course (Addendum)
  Brief Narrative:  87 year old with history of recurrent vasovagal syncope, recurrent UTI, DM2, paroxysmal A-fib status post LAA clipping, CAD status post CABG, LAD stent, PVD, spinal stenosis, OSA on CPAP, recurrent UTI presented with dizziness and hypertensive emergency for 1 week.  Upon admission systolic blood pressure greater than 200, EKG unremarkable.  MRI brain showed chronic changes.   Assessment & Plan:  Principal Problem:   Hypertensive emergency Active Problems:   Peripheral vertigo   Acute sinusitis   Recurrent syncope   A-fib s/p ligation of left atrial appendage   Depression, major, in remission (HCC)   OSA on CPAP   Diabetes mellitus type 2, insulin dependent (HCC)   CAD s/p CABG (coronary artery disease)   Obesity, Class III, BMI 40-49.9 (morbid obesity) (HCC)   Chronic back pain     Hypertensive emergency, improving Essential hypertension Symptomatic with headache and dizziness.  MRI negative for acute pathology but did show chronic changes.  Currently Coreg, losartan, verapamil have been restarted.  Blood pressure seems to have improved.  IV as needed   Acute sinusitis Peripheral vertigo MRI brain showing paranasal sinus disease and air-fluid level left sphenoid sinus    Recurrent syncope History of recurrent syncope but no syncopal events or lightheadedness, just vertigo.  Last echo in 2023, will repeat given extensive cardiac history Fall precautions   A-fib s/p ligation of left atrial appendage Continue verapamil, no longer on systemic anticoagulation   Chronic back pain Spinal stenosis Continue gabapentin and Tylenol   Obesity, Class III, BMI 40-49.9 (morbid obesity) (HCC) Complicating factor to overall prognosis and care   CAD s/p CABG (coronary artery disease) Continue aspirin, atorvastatin and carvedilol.  IV as needed   Diabetes mellitus type 2, insulin dependent (HCC) Continue basal insulin Sliding scale insulin coverage   OSA on  CPAP CPAP nightly   PT/OT  DVT prophylaxis: Lovenox Code Status: Full code Family Communication: Friend at bedside Ongoing management for dizziness and evaluation 5.    Subjective: During my visit advised she has no complaints.  Does admit that with change in position she gets dizzy.  Sometimes it has very severe.  Yesterday dizziness started as she started working with therapy team.   Examination:  General exam: Appears calm and comfortable  Respiratory system: Clear to auscultation. Respiratory effort normal. Cardiovascular system: S1 & S2 heard, RRR. No JVD, murmurs, rubs, gallops or clicks. No pedal edema. Gastrointestinal system: Abdomen is nondistended, soft and nontender. No organomegaly or masses felt. Normal bowel sounds heard. Central nervous system: Alert and oriented. No focal neurological deficits. Extremities: Symmetric 5 x 5 power. Skin: No rashes, lesions or ulcers Psychiatry: Judgement and insight appear normal. Mood & affect appropriate.

## 2023-05-12 DIAGNOSIS — Z6835 Body mass index (BMI) 35.0-35.9, adult: Secondary | ICD-10-CM | POA: Diagnosis not present

## 2023-05-12 DIAGNOSIS — G4733 Obstructive sleep apnea (adult) (pediatric): Secondary | ICD-10-CM | POA: Diagnosis present

## 2023-05-12 DIAGNOSIS — I119 Hypertensive heart disease without heart failure: Secondary | ICD-10-CM | POA: Diagnosis present

## 2023-05-12 DIAGNOSIS — D649 Anemia, unspecified: Secondary | ICD-10-CM | POA: Diagnosis present

## 2023-05-12 DIAGNOSIS — E66813 Obesity, class 3: Secondary | ICD-10-CM | POA: Diagnosis present

## 2023-05-12 DIAGNOSIS — M549 Dorsalgia, unspecified: Secondary | ICD-10-CM | POA: Diagnosis present

## 2023-05-12 DIAGNOSIS — E1140 Type 2 diabetes mellitus with diabetic neuropathy, unspecified: Secondary | ICD-10-CM | POA: Diagnosis present

## 2023-05-12 DIAGNOSIS — I480 Paroxysmal atrial fibrillation: Secondary | ICD-10-CM | POA: Diagnosis present

## 2023-05-12 DIAGNOSIS — J019 Acute sinusitis, unspecified: Secondary | ICD-10-CM | POA: Diagnosis present

## 2023-05-12 DIAGNOSIS — Z794 Long term (current) use of insulin: Secondary | ICD-10-CM | POA: Diagnosis not present

## 2023-05-12 DIAGNOSIS — I161 Hypertensive emergency: Secondary | ICD-10-CM | POA: Diagnosis present

## 2023-05-12 DIAGNOSIS — I2510 Atherosclerotic heart disease of native coronary artery without angina pectoris: Secondary | ICD-10-CM | POA: Diagnosis present

## 2023-05-12 DIAGNOSIS — F325 Major depressive disorder, single episode, in full remission: Secondary | ICD-10-CM | POA: Diagnosis present

## 2023-05-12 DIAGNOSIS — G8929 Other chronic pain: Secondary | ICD-10-CM | POA: Diagnosis present

## 2023-05-12 DIAGNOSIS — H81399 Other peripheral vertigo, unspecified ear: Secondary | ICD-10-CM | POA: Diagnosis present

## 2023-05-12 DIAGNOSIS — R42 Dizziness and giddiness: Secondary | ICD-10-CM | POA: Diagnosis present

## 2023-05-12 DIAGNOSIS — E1151 Type 2 diabetes mellitus with diabetic peripheral angiopathy without gangrene: Secondary | ICD-10-CM | POA: Diagnosis present

## 2023-05-12 DIAGNOSIS — K219 Gastro-esophageal reflux disease without esophagitis: Secondary | ICD-10-CM | POA: Diagnosis present

## 2023-05-12 DIAGNOSIS — E785 Hyperlipidemia, unspecified: Secondary | ICD-10-CM | POA: Diagnosis present

## 2023-05-12 DIAGNOSIS — I4892 Unspecified atrial flutter: Secondary | ICD-10-CM | POA: Diagnosis present

## 2023-05-12 DIAGNOSIS — Z96653 Presence of artificial knee joint, bilateral: Secondary | ICD-10-CM | POA: Diagnosis present

## 2023-05-12 DIAGNOSIS — M4800 Spinal stenosis, site unspecified: Secondary | ICD-10-CM | POA: Diagnosis present

## 2023-05-12 DIAGNOSIS — E876 Hypokalemia: Secondary | ICD-10-CM | POA: Diagnosis present

## 2023-05-12 DIAGNOSIS — H538 Other visual disturbances: Secondary | ICD-10-CM | POA: Diagnosis present

## 2023-05-12 LAB — BASIC METABOLIC PANEL
Anion gap: 8 (ref 5–15)
BUN: 14 mg/dL (ref 8–23)
CO2: 28 mmol/L (ref 22–32)
Calcium: 8.5 mg/dL — ABNORMAL LOW (ref 8.9–10.3)
Chloride: 102 mmol/L (ref 98–111)
Creatinine, Ser: 0.98 mg/dL (ref 0.44–1.00)
GFR, Estimated: 57 mL/min — ABNORMAL LOW (ref >60)
Glucose, Bld: 114 mg/dL — ABNORMAL HIGH (ref 70–99)
Potassium: 3.2 mmol/L — ABNORMAL LOW (ref 3.5–5.1)
Sodium: 138 mmol/L (ref 135–145)

## 2023-05-12 LAB — CBC
HCT: 38.9 % (ref 36.0–46.0)
Hemoglobin: 12.6 g/dL (ref 12.0–15.0)
MCH: 27.4 pg (ref 26.0–34.0)
MCHC: 32.4 g/dL (ref 30.0–36.0)
MCV: 84.6 fL (ref 80.0–100.0)
Platelets: 177 10*3/uL (ref 150–400)
RBC: 4.6 MIL/uL (ref 3.87–5.11)
RDW: 14.7 % (ref 11.5–15.5)
WBC: 6 10*3/uL (ref 4.0–10.5)
nRBC: 0 % (ref 0.0–0.2)

## 2023-05-12 LAB — MAGNESIUM: Magnesium: 1.7 mg/dL (ref 1.7–2.4)

## 2023-05-12 LAB — GLUCOSE, CAPILLARY
Glucose-Capillary: 155 mg/dL — ABNORMAL HIGH (ref 70–99)
Glucose-Capillary: 238 mg/dL — ABNORMAL HIGH (ref 70–99)
Glucose-Capillary: 112 mg/dL — ABNORMAL HIGH (ref 70–99)

## 2023-05-12 LAB — CBG MONITORING, ED: Glucose-Capillary: 131 mg/dL — ABNORMAL HIGH (ref 70–99)

## 2023-05-12 MED ORDER — MAGNESIUM OXIDE -MG SUPPLEMENT 400 (240 MG) MG PO TABS
800.0000 mg | ORAL_TABLET | Freq: Once | ORAL | Status: AC
  Administered 2023-05-12: 800 mg via ORAL
  Filled 2023-05-12: qty 2

## 2023-05-12 MED ORDER — POTASSIUM CHLORIDE CRYS ER 20 MEQ PO TBCR
40.0000 meq | EXTENDED_RELEASE_TABLET | Freq: Once | ORAL | Status: AC
  Administered 2023-05-12: 40 meq via ORAL
  Filled 2023-05-12: qty 2

## 2023-05-12 MED ORDER — CARVEDILOL 25 MG PO TABS
25.0000 mg | ORAL_TABLET | Freq: Two times a day (BID) | ORAL | Status: DC
  Administered 2023-05-12 – 2023-05-13 (×2): 25 mg via ORAL
  Filled 2023-05-12 (×2): qty 1

## 2023-05-12 MED ORDER — HYDRALAZINE HCL 10 MG PO TABS
10.0000 mg | ORAL_TABLET | Freq: Three times a day (TID) | ORAL | Status: DC
  Administered 2023-05-12 – 2023-05-13 (×4): 10 mg via ORAL
  Filled 2023-05-12 (×4): qty 1

## 2023-05-12 NOTE — TOC Progression Note (Signed)
Transition of Care [facility]) - Progression Note    Patient Details  Name: Brandi [name] MRN: [id number] Date of Birth: 03-Jan-1936  Transition of Care [facility]) CM/SW Contact  Bing Quarry, RN Phone Number: 05/12/2023, 4:20 PM  Clinical Narrative: 11/23: OBS status changed to Inpatient at 418 pm.   Gabriel Cirri MSN RN CM  Care Management Department.    [facility] Direct Dial: [phone] Main Office Phone: [phone] Weekends Only           Expected Discharge Plan and Services                                               Social Determinants of Health (SDOH) Interventions SDOH Screenings   Food Insecurity: No Food Insecurity (05/11/2023)  Housing: Low Risk  (05/11/2023)  Transportation Needs: No Transportation Needs (05/11/2023)  Utilities: Not At Risk (05/11/2023)  Depression (PHQ2-9): Low Risk  (05/30/2019)  Financial Resource Strain: Low Risk  (02/22/2023)   Received from [facility]  Tobacco Use: Low Risk  (05/11/2023)    Readmission Risk Interventions     No data to display

## 2023-05-12 NOTE — ED Notes (Signed)
Pt used bedside commode, no other needs at this time.

## 2023-05-12 NOTE — Progress Notes (Signed)
   05/12/23 0855  Assess: MEWS Score  Temp 98 F (36.7 C)  BP (!) 220/68  MAP (mmHg) 111  Pulse Rate 85  Level of Consciousness Alert  SpO2 97 %  O2 Device Room Air  Assess: MEWS Score  MEWS Temp 0  MEWS Systolic 2  MEWS Pulse 0  MEWS RR 0  MEWS LOC 0  MEWS Score 2  MEWS Score Color Yellow  Assess: if the MEWS score is Yellow or Red  Were vital signs accurate and taken at a resting state? Yes  Does the patient meet 2 or more of the SIRS criteria? No  MEWS guidelines implemented  Yes, yellow  Treat  MEWS Interventions Considered administering scheduled or prn medications/treatments as ordered  Take Vital Signs  Increase Vital Sign Frequency  Yellow: Q2hr x1, continue Q4hrs until patient remains green for 12hrs  Escalate  MEWS: Escalate Yellow: Discuss with charge nurse and consider notifying provider and/or RRT  Notify: Charge Nurse/RN  Name of Charge Nurse/RN Notified Lars Mage, RN  Provider Notification  Provider Name/Title Nelson Chimes MD  Date Provider Notified 05/12/23  Time Provider Notified 0900  Method of Notification Page  Notification Reason Other (Comment) (Yellow MEWS, BP 220/68)  Provider response Evaluate remotely;No new orders  Date of Provider Response 05/12/23  Time of Provider Response 0905  Assess: SIRS CRITERIA  SIRS Temperature  0  SIRS Pulse 0  SIRS Respirations  0  SIRS WBC 0  SIRS Score Sum  0

## 2023-05-12 NOTE — Progress Notes (Signed)
Approximately 0900-- Pt arrived from ED to room 233. Upon arrival, pt A&O x 4 and VS obtained. Full assessment completed by this RN. No S/S of distress. Telemetry initiated. Pt is accompanied by son, at bedside.

## 2023-05-12 NOTE — Plan of Care (Signed)

## 2023-05-12 NOTE — Progress Notes (Signed)
PROGRESS NOTE    Brandi [name]  NGE:[id number] DOB: 12/26/1935 DOA: 05/10/2023 PCP: Gracelyn Nurse, MD     Brief Narrative:  87 year old with history of recurrent vasovagal syncope, recurrent UTI, DM2, paroxysmal A-fib status post LAA clipping, CAD status post CABG, LAD stent, PVD, spinal stenosis, OSA on CPAP, recurrent UTI presented with dizziness and hypertensive emergency for 1 week.  Upon admission systolic blood pressure greater than 200, EKG unremarkable.  MRI brain showed chronic changes.  Echo was overall unremarkable.  Due to high blood pressure, low-dose hydralazine was added which should be adjusted outpatient as necessary.  PT/OT recommending home health.   Assessment & Plan:  Principal Problem:   Hypertensive emergency Active Problems:   Peripheral vertigo   Acute sinusitis   Recurrent syncope   A-fib s/p ligation of left atrial appendage   Depression, major, in remission (HCC)   OSA on CPAP   Diabetes mellitus type 2, insulin dependent (HCC)   CAD s/p CABG (coronary artery disease)   Obesity, Class III, BMI 40-49.9 (morbid obesity) (HCC)   Chronic back pain     Hypertensive emergency Essential hypertension; very labile Symptomatic with headache and dizziness.  MRI negative for acute pathology but did show chronic changes.  Currently osartan, verapamil have been restarted.  Blood pressure overall still quite labile. Increase Coreg 25mg  BID. Will add hydralazine 10 mg 3 times daily.  This can be uptitrated outpatient as necessary.   Acute sinusitis Peripheral vertigo MRI brain showing paranasal sinus disease and air-fluid level left sphenoid sinus   Hypokalemia - Repletion   Recurrent syncope History of recurrent syncope but no syncopal events or lightheadedness, just vertigo.  Last echo in 2023, and repeat echo during this admission shows preserved EF with grade 1 DD. Fall precautions   A-fib s/p ligation of left atrial appendage Continue verapamil, no  longer on systemic anticoagulation   Chronic back pain Spinal stenosis Continue gabapentin and Tylenol   Obesity, Class III, BMI 40-49.9 (morbid obesity) (HCC) Complicating factor to overall prognosis and care   CAD s/p CABG (coronary artery disease) Continue aspirin, atorvastatin and carvedilol.  IV as needed   Diabetes mellitus type 2, insulin dependent (HCC) Continue basal insulin Sliding scale insulin coverage   OSA on CPAP CPAP nightly   PT/OT- HH; F2F completed.   DVT prophylaxis: Lovenox Code Status: Full code Family Communication: Friend & son at bedside Still very labile BP, cont hospital stay until more stable  Subjective: SBP into 200's this morning with some headache Improved with IV and PO meds.  No other complaints.   Examination:  General exam: Appears calm and comfortable  Respiratory system: Clear to auscultation. Respiratory effort normal. Cardiovascular system: S1 & S2 heard, RRR. No JVD, murmurs, rubs, gallops or clicks. No pedal edema. Gastrointestinal system: Abdomen is nondistended, soft and nontender. No organomegaly or masses felt. Normal bowel sounds heard. Central nervous system: Alert and oriented. No focal neurological deficits. Extremities: Symmetric 5 x 5 power. Skin: No rashes, lesions or ulcers Psychiatry: Judgement and insight appear normal. Mood & affect appropriate.           Diet Orders (From admission, onward)     Start     Ordered   05/10/23 2357  Diet heart healthy/carb modified Room service appropriate? Yes; Fluid consistency: Thin  Diet effective now       Question Answer Comment  Diet-HS Snack? Nothing   Room service appropriate? Yes   Fluid consistency: Thin  05/10/23 2357            Objective: Vitals:   05/12/23 0500 05/12/23 0558 05/12/23 0855 05/12/23 1000  BP: (!) 144/58  (!) 220/68 (!) 194/57  Pulse: 65  85 87  Resp: 14     Temp:  98 F (36.7 C) 98 F (36.7 C) 97.8 F (36.6 C)  TempSrc:   Oral Oral Oral  SpO2: 96%  97% 97%  Weight:      Height:        Intake/Output Summary (Last 24 hours) at 05/12/2023 1145 Last data filed at 05/12/2023 0227 Gross per 24 hour  Intake 1528.33 ml  Output --  Net 1528.33 ml   Filed Weights   05/10/23 1807  Weight: 86.2 kg    Scheduled Meds:  aspirin EC  81 mg Oral Daily   atorvastatin  40 mg Oral QPM   carvedilol  25 mg Oral BID WC   enoxaparin (LOVENOX) injection  0.5 mg/kg Subcutaneous Q24H   fluticasone  1 spray Each Nare Daily   gabapentin  300 mg Oral TID   hydrALAZINE  10 mg Oral TID   insulin aspart  0-15 Units Subcutaneous TID WC   insulin aspart  0-5 Units Subcutaneous QHS   insulin glargine-yfgn  10 Units Subcutaneous Daily   loratadine  10 mg Oral BH-q7a   losartan  100 mg Oral QHS   melatonin  10 mg Oral QHS   mirabegron ER  50 mg Oral Daily   verapamil  240 mg Oral QHS   Continuous Infusions:  sodium chloride Stopped (05/12/23 0226)    Nutritional status     Body mass index is 35.9 kg/m.  Data Reviewed:   CBC: Recent Labs  Lab 05/10/23 1811 05/12/23 0505  WBC 6.7 6.0  HGB 14.1 12.6  HCT 43.3 38.9  MCV 85.7 84.6  PLT 204 177   Basic Metabolic Panel: Recent Labs  Lab 05/10/23 1811 05/12/23 0505  NA 139 138  K 4.0 3.2*  CL 101 102  CO2 26 28  GLUCOSE 125* 114*  BUN 13 14  CREATININE 0.69 0.98  CALCIUM 8.9 8.5*  MG  --  1.7   GFR: Estimated Creatinine Clearance: 41.9 mL/min (by C-G formula based on SCr of 0.98 mg/dL). Liver Function Tests: No results for input(s): "AST", "ALT", "ALKPHOS", "BILITOT", "PROT", "ALBUMIN" in the last 168 hours. No results for input(s): "LIPASE", "AMYLASE" in the last 168 hours. No results for input(s): "AMMONIA" in the last 168 hours. Coagulation Profile: No results for input(s): "INR", "PROTIME" in the last 168 hours. Cardiac Enzymes: No results for input(s): "CKTOTAL", "CKMB", "CKMBINDEX", "TROPONINI" in the last 168 hours. BNP (last 3 results) No  results for input(s): "PROBNP" in the last 8760 hours. HbA1C: Recent Labs    05/10/23 1811  HGBA1C 6.5*   CBG: Recent Labs  Lab 05/11/23 0802 05/11/23 1123 05/11/23 1846 05/11/23 2131 05/12/23 0719  GLUCAP 92 148* 194* 116* 131*   Lipid Profile: No results for input(s): "CHOL", "HDL", "LDLCALC", "TRIG", "CHOLHDL", "LDLDIRECT" in the last 72 hours. Thyroid Function Tests: Recent Labs    05/10/23 1811  TSH 2.348   Anemia Panel: No results for input(s): "VITAMINB12", "FOLATE", "FERRITIN", "TIBC", "IRON", "RETICCTPCT" in the last 72 hours. Sepsis Labs: No results for input(s): "PROCALCITON", "LATICACIDVEN" in the last 168 hours.  No results found for this or any previous visit (from the past 240 hour(s)).       Radiology Studies: ECHOCARDIOGRAM COMPLETE  Result Date:  05/11/2023    ECHOCARDIOGRAM REPORT   Patient Name:   Brandi [name] Date of Exam: 05/11/2023 Medical Rec #:  [id number]     Height:       61.0 in Accession #:    [id number]    Weight:       190.0 lb Date of Birth:  1936-02-11     BSA:          1.848 m Patient Age:    87 years      BP:           170/98 mmHg Patient Gender: F             HR:           75 bpm. Exam Location:  [facility] Procedure: 2D Echo, Cardiac Doppler and Color Doppler Indications:     Cardiomyopathy  History:         Patient has prior history of Echocardiogram examinations, most                  recent 02/03/2022. Cardiomyopathy, CAD, Prior CABG,                  Arrythmias:Atrial Fibrillation, Signs/Symptoms:Chest Pain; Risk                  Factors:Hypertension, Sleep Apnea and Diabetes. CKD.  Sonographer:     Mikki Harbor Referring Phys:  [id number] Doreen Salvage Kimblery Diop Diagnosing Phys: Debbe Odea MD  Sonographer Comments: Technically difficult study due to poor echo windows and suboptimal apical window. IMPRESSIONS  1. Left ventricular ejection fraction, by estimation, is 55 to 60%. The left ventricle has normal function. The left ventricle has no  regional wall motion abnormalities. There is moderate left ventricular hypertrophy. Left ventricular diastolic parameters are consistent with Grade I diastolic dysfunction (impaired relaxation).  2. Right ventricular systolic function is normal. The right ventricular size is normal. There is normal pulmonary artery systolic pressure.  3. The mitral valve is normal in structure. Mild mitral valve regurgitation.  4. The aortic valve was not well visualized. Aortic valve regurgitation is not visualized. Aortic valve sclerosis/calcification is present, without any evidence of aortic stenosis.  5. The inferior vena cava is normal in size with greater than 50% respiratory variability, suggesting right atrial pressure of 3 mmHg. FINDINGS  Left Ventricle: Left ventricular ejection fraction, by estimation, is 55 to 60%. The left ventricle has normal function. The left ventricle has no regional wall motion abnormalities. The left ventricular internal cavity size was normal in size. There is  moderate left ventricular hypertrophy. Left ventricular diastolic parameters are consistent with Grade I diastolic dysfunction (impaired relaxation). Right Ventricle: The right ventricular size is normal. No increase in right ventricular wall thickness. Right ventricular systolic function is normal. There is normal pulmonary artery systolic pressure. The tricuspid regurgitant velocity is 2.08 m/s, and  with an assumed right atrial pressure of 8 mmHg, the estimated right ventricular systolic pressure is 25.3 mmHg. Left Atrium: Left atrial size was normal in size. Right Atrium: Right atrial size was normal in size. Pericardium: There is no evidence of pericardial effusion. Mitral Valve: The mitral valve is normal in structure. Mild mitral valve regurgitation. MV peak gradient, 6.7 mmHg. The mean mitral valve gradient is 2.0 mmHg. Tricuspid Valve: The tricuspid valve is normal in structure. Tricuspid valve regurgitation is mild. Aortic  Valve: The aortic valve was not well visualized. Aortic valve regurgitation is not visualized. Aortic valve sclerosis/calcification is present,  without any evidence of aortic stenosis. Aortic valve mean gradient measures 4.0 mmHg. Aortic valve peak gradient measures 7.6 mmHg. Aortic valve area, by VTI measures 2.27 cm. Pulmonic Valve: The pulmonic valve was normal in structure. Pulmonic valve regurgitation is not visualized. Aorta: The aortic root is normal in size and structure. Venous: The inferior vena cava is normal in size with greater than 50% respiratory variability, suggesting right atrial pressure of 3 mmHg. IAS/Shunts: No atrial level shunt detected by color flow Doppler.  LEFT VENTRICLE PLAX 2D LVIDd:         2.70 cm   Diastology LVIDs:         2.00 cm   LV e' medial:    4.79 cm/s LV PW:         1.60 cm   LV E/e' medial:  14.6 LV IVS:        1.80 cm   LV e' lateral:   3.92 cm/s LVOT diam:     2.00 cm   LV E/e' lateral: 17.8 LV SV:         73 LV SV Index:   40 LVOT Area:     3.14 cm  RIGHT VENTRICLE RV Basal diam:  3.80 cm RV Mid diam:    3.20 cm LEFT ATRIUM             Index        RIGHT ATRIUM           Index LA diam:        3.50 cm 1.89 cm/m   RA Area:     15.00 cm LA Vol (A2C):   64.8 ml 35.06 ml/m  RA Volume:   36.80 ml  19.91 ml/m LA Vol (A4C):   45.6 ml 24.67 ml/m LA Biplane Vol: 59.0 ml 31.93 ml/m  AORTIC VALVE                    PULMONIC VALVE AV Area (Vmax):    2.87 cm     PV Vmax:       0.98 m/s AV Area (Vmean):   2.45 cm     PV Peak grad:  3.8 mmHg AV Area (VTI):     2.27 cm AV Vmax:           138.00 cm/s AV Vmean:          91.700 cm/s AV VTI:            0.323 m AV Peak Grad:      7.6 mmHg AV Mean Grad:      4.0 mmHg LVOT Vmax:         126.00 cm/s LVOT Vmean:        71.600 cm/s LVOT VTI:          0.233 m LVOT/AV VTI ratio: 0.72  AORTA Ao Root diam: 2.60 cm MITRAL VALVE                TRICUSPID VALVE MV Area (PHT): 2.45 cm     TR Peak grad:   17.3 mmHg MV Area VTI:   2.67 cm     TR  Vmax:        208.00 cm/s MV Peak grad:  6.7 mmHg MV Mean grad:  2.0 mmHg     SHUNTS MV Vmax:       1.29 m/s     Systemic VTI:  0.23 m MV Vmean:      60.6 cm/s  Systemic Diam: 2.00 cm MV Decel Time: 310 msec MV E velocity: 69.70 cm/s MV A velocity: 113.00 cm/s MV E/A ratio:  0.62 Debbe Odea MD Electronically signed by Debbe Odea MD Signature Date/Time: 05/11/2023/4:22:25 PM    Final    MR BRAIN WO CONTRAST  Result Date: 05/10/2023 CLINICAL DATA:  Initial evaluation for neuro deficit, stroke suspected. EXAM: MRI HEAD WITHOUT CONTRAST TECHNIQUE: Multiplanar, multiecho pulse sequences of the brain and surrounding structures were obtained without intravenous contrast. COMPARISON:  CT from earlier the same day. FINDINGS: Brain: Cerebral volume within normal limits. Patchy T2/FLAIR hyperintensity involving the supratentorial cerebral white matter, consistent with chronic small vessel ischemic disease, moderately advanced. No evidence for acute or subacute ischemia. No acute intracranial hemorrhage. Few chronic micro hemorrhages noted at the pons, likely hypertensive in nature. No mass lesion, midline shift or mass effect. No hydrocephalus or extra-axial fluid collection. Empty sella noted. Vascular: Major intracranial vascular flow voids are maintained. Skull and upper cervical spine: Craniocervical junction normal. Bone marrow signal intensity within normal limits. No scalp soft tissue abnormality. Sinuses/Orbits: Prior bilateral ocular lens replacement. Scattered mucosal thickening present about the sphenoid ethmoidal sinuses with small air-fluid level within the left sphenoid sinus. No significant mastoid effusion. Other: None. IMPRESSION: 1. No acute intracranial abnormality. 2. Moderately advanced chronic microvascular ischemic disease. 3. Scattered paranasal sinus disease with small air-fluid level within the left sphenoid sinus. Clinical correlation for possible acute sinusitis recommended.  Electronically Signed   By: Rise Mu M.D.   On: 05/10/2023 21:40   CT Head Wo Contrast  Result Date: 05/10/2023 CLINICAL DATA:  Hypertension neurological deficit, initial encounter EXAM: CT HEAD WITHOUT CONTRAST TECHNIQUE: Contiguous axial images were obtained from the base of the skull through the vertex without intravenous contrast. RADIATION DOSE REDUCTION: This exam was performed according to the departmental dose-optimization program which includes automated exposure control, adjustment of the mA and/or kV according to patient size and/or use of iterative reconstruction technique. COMPARISON:  02/02/2022 FINDINGS: Brain: No evidence of acute infarction, hemorrhage, hydrocephalus, extra-axial collection or mass lesion/mass effect. Mild atrophic changes are noted. Mild chronic white matter ischemic changes are seen as well. Vascular: No hyperdense vessel or unexpected calcification. Skull: Normal. Negative for fracture or focal lesion. Sinuses/Orbits: No acute finding. Other: None IMPRESSION: No acute intracranial abnormality noted. Chronic changes as described. Electronically Signed   By: Alcide Clever M.D.   On: 05/10/2023 19:53           LOS: 0 days   Time spent= 35 mins    Miguel Rota, MD [facility]  If 7PM-7AM, please contact night-coverage  05/12/2023, 11:45 AM

## 2023-05-12 NOTE — ED Notes (Signed)
Harrold Donath RN aware of assigned bed

## 2023-05-13 DIAGNOSIS — I161 Hypertensive emergency: Secondary | ICD-10-CM | POA: Diagnosis not present

## 2023-05-13 LAB — CBC
HCT: 36.7 % (ref 36.0–46.0)
Hemoglobin: 12.2 g/dL (ref 12.0–15.0)
MCH: 27.5 pg (ref 26.0–34.0)
MCHC: 33.2 g/dL (ref 30.0–36.0)
MCV: 82.8 fL (ref 80.0–100.0)
Platelets: 204 10*3/uL (ref 150–400)
RBC: 4.43 MIL/uL (ref 3.87–5.11)
RDW: 14.8 % (ref 11.5–15.5)
WBC: 7.2 10*3/uL (ref 4.0–10.5)
nRBC: 0 % (ref 0.0–0.2)

## 2023-05-13 LAB — MAGNESIUM: Magnesium: 1.9 mg/dL (ref 1.7–2.4)

## 2023-05-13 LAB — BASIC METABOLIC PANEL
Anion gap: 10 (ref 5–15)
BUN: 20 mg/dL (ref 8–23)
CO2: 24 mmol/L (ref 22–32)
Calcium: 8.3 mg/dL — ABNORMAL LOW (ref 8.9–10.3)
Chloride: 104 mmol/L (ref 98–111)
Creatinine, Ser: 1.08 mg/dL — ABNORMAL HIGH (ref 0.44–1.00)
GFR, Estimated: 50 mL/min — ABNORMAL LOW (ref >60)
Glucose, Bld: 111 mg/dL — ABNORMAL HIGH (ref 70–99)
Potassium: 4 mmol/L (ref 3.5–5.1)
Sodium: 138 mmol/L (ref 135–145)

## 2023-05-13 LAB — GLUCOSE, CAPILLARY: Glucose-Capillary: 142 mg/dL — ABNORMAL HIGH (ref 70–99)

## 2023-05-13 MED ORDER — HYDRALAZINE HCL 10 MG PO TABS: 10.0000 mg | ORAL_TABLET | Freq: Three times a day (TID) | ORAL | 0 refills | Status: DC

## 2023-05-13 MED ORDER — CARVEDILOL 25 MG PO TABS: 25.0000 mg | ORAL_TABLET | Freq: Two times a day (BID) | ORAL | 0 refills | Status: DC

## 2023-05-13 NOTE — TOC Progression Note (Signed)
Transition of Care [facility]) - Progression Note    Patient Details  Name: Brandi [name] MRN: [id number] Date of Birth: Mar 10, 1936  [facility]) CM/SW Contact  Bing Quarry, RN Phone Number: 05/13/2023, 12:05 PM  Clinical Narrative: 11/24: [facility] via [facility] for [facility] at discharge. Will need HH orders at discharge.   Gabriel Cirri MSN RN CM  Care Management Department.  [place]  [facility] Direct Dial: [phone] Main Office Phone: [phone] Weekends Only             Expected Discharge Plan and Services         Expected Discharge Date: 05/13/23                                     Social Determinants of Health (SDOH) Interventions SDOH Screenings   Food Insecurity: No Food Insecurity (05/11/2023)  Housing: Low Risk  (05/11/2023)  Transportation Needs: No Transportation Needs (05/11/2023)  Utilities: Not At Risk (05/11/2023)  Depression (PHQ2-9): Low Risk  (05/30/2019)  Financial Resource Strain: Low Risk  (02/22/2023)   Received from [facility]  Tobacco Use: Low Risk  (05/11/2023)    Readmission Risk Interventions     No data to display

## 2023-05-13 NOTE — Discharge Summary (Signed)
Physician Discharge Summary  Brandi [name] MVH:[id number] DOB: 11/07/1935 DOA: 05/10/2023  PCP: Gracelyn Nurse, MD  Admit date: 05/10/2023 Discharge date: 05/13/2023  Admitted From: Home Disposition:  HOme  Recommendations for Outpatient Follow-up:  Follow up with PCP in 1-2 weeks Please obtain BMP/CBC in one week your next doctors visit.  Coreg increased to 25 mg twice daily.  Hydralazine 10 mg 3 times daily added.   Discharge Condition: Stable CODE STATUS: Full code Diet recommendation: Low-salt    Brief Narrative:  87 year old with history of recurrent vasovagal syncope, recurrent UTI, DM2, paroxysmal A-fib status post LAA clipping, CAD status post CABG, LAD stent, PVD, spinal stenosis, OSA on CPAP, recurrent UTI presented with dizziness and hypertensive emergency for 1 week.  Upon admission systolic blood pressure greater than 200, EKG unremarkable.  MRI brain showed chronic changes.  Echo was overall unremarkable.  Due to high blood pressure, low-dose hydralazine was added which should be adjusted outpatient as necessary.  PT/OT recommending home health. Doing well, BP is better.  Will dc her today  Assessment & Plan:  Principal Problem:   Hypertensive emergency Active Problems:   Peripheral vertigo   Acute sinusitis   Recurrent syncope   A-fib s/p ligation of left atrial appendage   Depression, major, in remission (HCC)   OSA on CPAP   Diabetes mellitus type 2, insulin dependent (HCC)   CAD s/p CABG (coronary artery disease)   Obesity, Class III, BMI 40-49.9 (morbid obesity) (HCC)   Chronic back pain     Hypertensive emergency; improved Essential hypertension; very labile. Improved Initially had signs and symptoms of headache and dizziness likely secondary to elevated blood pressure.  MRI brain showed chronic changes.  Losartan and verapamil were continued.  Coreg was increased 25 mg twice daily, hydralazine 10 mg 3 times daily was added.  Blood pressure initially  was labile and now slowly started stabilizing.  Have advised to keep track of blood pressure logs at home and follow-up with PCP for any further adjustments.  For now goal SBP would be around 150   Acute sinusitis Peripheral vertigo MRI brain showing paranasal sinus disease and air-fluid level left sphenoid sinus   Hypokalemia - Repletion   Recurrent syncope History of recurrent syncope but no syncopal events or lightheadedness, just vertigo.  Last echo in 2023, and repeat echo during this admission shows preserved EF with grade 1 DD. Fall precautions   A-fib s/p ligation of left atrial appendage Continue verapamil, no longer on systemic anticoagulation   Chronic back pain Spinal stenosis Continue gabapentin and Tylenol   Obesity, Class III, BMI 40-49.9 (morbid obesity) (HCC) Complicating factor to overall prognosis and care   CAD s/p CABG (coronary artery disease) Continue aspirin, atorvastatin and carvedilol.     Diabetes mellitus type 2, insulin dependent (HCC) Continue basal insulin Sliding scale insulin coverage   OSA on CPAP CPAP nightly   PT/OT- HH; F2F completed.   DVT prophylaxis: Lovenox Code Status: Full code Family Communication: Friend & son at bedside Still very labile BP, cont hospital stay until more stable  Subjective: SBP somewhat better range.  Still slightly labile but acceptable at this time. No other symptoms, wants to go home.   Examination:  General exam: Appears calm and comfortable  Respiratory system: Clear to auscultation. Respiratory effort normal. Cardiovascular system: S1 & S2 heard, RRR. No JVD, murmurs, rubs, gallops or clicks. No pedal edema. Gastrointestinal system: Abdomen is nondistended, soft and nontender. No organomegaly or masses felt.  Normal bowel sounds heard. Central nervous system: Alert and oriented. No focal neurological deficits. Extremities: Symmetric 5 x 5 power. Skin: No rashes, lesions or ulcers Psychiatry:  Judgement and insight appear normal. Mood & affect appropriate.      Discharge Diagnoses:  Principal Problem:   Hypertensive emergency Active Problems:   Peripheral vertigo   Acute sinusitis   Recurrent syncope   A-fib s/p ligation of left atrial appendage   Depression, major, in remission (HCC)   OSA on CPAP   Diabetes mellitus type 2, insulin dependent (HCC)   CAD s/p CABG (coronary artery disease)   Obesity, Class III, BMI 40-49.9 (morbid obesity) (HCC)   Chronic back pain     Discharge Exam: Vitals:   05/13/23 0429 05/13/23 0816  BP: (!) 147/50 (!) 153/62  Pulse: 86 91  Resp: 20 18  Temp: 98.1 F (36.7 C) 98.1 F (36.7 C)  SpO2: 97% 98%   Vitals:   05/12/23 1958 05/12/23 2326 05/13/23 0429 05/13/23 0816  BP: 138/64 (!) 189/74 (!) 147/50 (!) 153/62  Pulse: 78 84 86 91  Resp: 18 20 20 18   Temp: 98.2 F (36.8 C) 97.7 F (36.5 C) 98.1 F (36.7 C) 98.1 F (36.7 C)  TempSrc: Oral Oral Oral   SpO2: 98% 98% 97% 98%  Weight:      Height:        Discharge Instructions   Allergies as of 05/13/2023       Reactions   Accupril [quinapril Hcl] Rash   Micardis [telmisartan] Other (See Comments)   Dizziness   Quinapril Rash        Medication List     TAKE these medications    acetaminophen 500 MG tablet Commonly known as: TYLENOL Take 1,000 mg by mouth 3 (three) times daily.   aspirin EC 81 MG tablet Take 81 mg by mouth daily.   atorvastatin 40 MG tablet Commonly known as: LIPITOR Take 40 mg by mouth every evening.   budesonide-formoterol 80-4.5 MCG/ACT inhaler Commonly known as: SYMBICORT Inhale 2 puffs into the lungs 2 (two) times daily.   CALCIUM CARBONATE+VITAMIN D PO Take 1 tablet by mouth daily at 6 (six) AM.   carboxymethylcellulose 0.5 % Soln Commonly known as: REFRESH PLUS Apply 1-2 drops to eye at bedtime.   carvedilol 25 MG tablet Commonly known as: COREG Take 1 tablet (25 mg total) by mouth 2 (two) times daily with a  meal. What changed:  medication strength how much to take when to take this additional instructions   famotidine 20 MG tablet Commonly known as: PEPCID Take 20 mg by mouth 2 (two) times daily.   ferrous sulfate 325 (65 FE) MG tablet Take 325 mg by mouth daily with breakfast.   gabapentin 400 MG capsule Commonly known as: NEURONTIN Take 400 mg by mouth 3 (three) times daily.   glipiZIDE 5 MG tablet Commonly known as: GLUCOTROL Take 1 tablet (5 mg total) by mouth daily. What changed: when to take this   hydrALAZINE 10 MG tablet Commonly known as: APRESOLINE Take 1 tablet (10 mg total) by mouth 3 (three) times daily.   Janumet XR 50-1000 MG Tb24 Generic drug: SitaGLIPtin-MetFORMIN HCl Take 1 tablet by mouth 2 (two) times daily.   Lantus SoloStar 100 UNIT/ML Solostar Pen Generic drug: insulin glargine Inject 10 Units into the skin every morning.   loratadine 10 MG tablet Commonly known as: CLARITIN Take 10 mg by mouth every morning.   losartan 100 MG tablet Commonly  known as: COZAAR Take 100 mg by mouth at bedtime.   meclizine 12.5 MG tablet Commonly known as: ANTIVERT Take 12.5 mg by mouth 3 (three) times daily as needed for dizziness.   Melatonin 10 MG Tabs Take 1 tablet by mouth at bedtime.   mirabegron ER 50 MG Tb24 tablet Commonly known as: MYRBETRIQ Take 1 tablet (50 mg total) by mouth daily. What changed: when to take this   nitroGLYCERIN 0.4 MG SL tablet Commonly known as: NITROSTAT Place 0.4 mg under the tongue every 5 (five) minutes as needed for chest pain.   PreserVision/Lutein Caps Take 1 capsule by mouth 2 (two) times daily.   progesterone 100 MG capsule Commonly known as: PROMETRIUM Take 100 mg by mouth at bedtime.   sodium chloride 0.65 % nasal spray Commonly known as: OCEAN Place 1 spray into the nose as needed.   Verapamil HCl CR 300 MG Cp24 Take 1 capsule by mouth at bedtime.   VITAMIN B-12 PO Take 1 tablet by mouth daily.         Follow-up Information     Gracelyn Nurse, MD Follow up in 1 week(s).   Specialty: Internal Medicine Contact information: [address] [facility] [place] [address] [phone]                Allergies  Allergen Reactions   Accupril [Quinapril Hcl] Rash   Micardis [Telmisartan] Other (See Comments)    Dizziness   Quinapril Rash    You were cared for by a hospitalist during your hospital stay. If you have any questions about your discharge medications or the care you received while you were in the hospital after you are discharged, you can call the unit and asked to speak with the hospitalist on call if the hospitalist that took care of you is not available. Once you are discharged, your primary care physician will handle any further medical issues. Please note that no refills for any discharge medications will be authorized once you are discharged, as it is imperative that you return to your primary care physician (or establish a relationship with a primary care physician if you do not have one) for your aftercare needs so that they can reassess your need for medications and monitor your lab values.  You were cared for by a hospitalist during your hospital stay. If you have any questions about your discharge medications or the care you received while you were in the hospital after you are discharged, you can call the unit and asked to speak with the hospitalist on call if the hospitalist that took care of you is not available. Once you are discharged, your primary care physician will handle any further medical issues. Please note that NO REFILLS for any discharge medications will be authorized once you are discharged, as it is imperative that you return to your primary care physician (or establish a relationship with a primary care physician if you do not have one) for your aftercare needs so that they can reassess your need for medications and monitor your lab  values.  Please request your Prim.MD to go over all Hospital Tests and Procedure/Radiological results at the follow up, please get all Hospital records sent to your Prim MD by signing hospital release before you go home.  Get CBC, CMP, 2 view Chest X ray checked  by Primary MD during your next visit or SNF MD in 5-7 days ( we routinely change or add medications that can affect  your baseline labs and fluid status, therefore we recommend that you get the mentioned basic workup next visit with your PCP, your PCP may decide not to get them or add new tests based on their clinical decision)  On your next visit with your primary care physician please Get Medicines reviewed and adjusted.  If you experience worsening of your admission symptoms, develop shortness of breath, life threatening emergency, suicidal or homicidal thoughts you must seek medical attention immediately by calling 911 or calling your MD immediately  if symptoms less severe.  You Must read complete instructions/literature along with all the possible adverse reactions/side effects for all the Medicines you take and that have been prescribed to you. Take any new Medicines after you have completely understood and accpet all the possible adverse reactions/side effects.   Do not drive, operate heavy machinery, perform activities at heights, swimming or participation in water activities or provide baby sitting services if your were admitted for syncope or siezures until you have seen by Primary MD or a Neurologist and advised to do so again.  Do not drive when taking Pain medications.   Procedures/Studies: ECHOCARDIOGRAM COMPLETE  Result Date: 05/11/2023    ECHOCARDIOGRAM REPORT   Patient Name:   Brandi [name] Date of Exam: 05/11/2023 Medical Rec #:  [id number]     Height:       61.0 in Accession #:    [id number]    Weight:       190.0 lb Date of Birth:  1935-12-20     BSA:          1.848 m Patient Age:    87 years      BP:           170/98  mmHg Patient Gender: F             HR:           75 bpm. Exam Location:  [facility] Procedure: 2D Echo, Cardiac Doppler and Color Doppler Indications:     Cardiomyopathy  History:         Patient has prior history of Echocardiogram examinations, most                  recent 02/03/2022. Cardiomyopathy, CAD, Prior CABG,                  Arrythmias:Atrial Fibrillation, Signs/Symptoms:Chest Pain; Risk                  Factors:Hypertension, Sleep Apnea and Diabetes. CKD.  Sonographer:     Mikki Harbor Referring Phys:  [id number] Doreen Salvage Rachella Basden Diagnosing Phys: Debbe Odea MD  Sonographer Comments: Technically difficult study due to poor echo windows and suboptimal apical window. IMPRESSIONS  1. Left ventricular ejection fraction, by estimation, is 55 to 60%. The left ventricle has normal function. The left ventricle has no regional wall motion abnormalities. There is moderate left ventricular hypertrophy. Left ventricular diastolic parameters are consistent with Grade I diastolic dysfunction (impaired relaxation).  2. Right ventricular systolic function is normal. The right ventricular size is normal. There is normal pulmonary artery systolic pressure.  3. The mitral valve is normal in structure. Mild mitral valve regurgitation.  4. The aortic valve was not well visualized. Aortic valve regurgitation is not visualized. Aortic valve sclerosis/calcification is present, without any evidence of aortic stenosis.  5. The inferior vena cava is normal in size with greater than 50% respiratory variability, suggesting right atrial pressure of 3 mmHg. FINDINGS  Left Ventricle: Left ventricular ejection fraction, by estimation, is 55 to 60%. The left ventricle has normal function. The left ventricle has no regional wall motion abnormalities. The left ventricular internal cavity size was normal in size. There is  moderate left ventricular hypertrophy. Left ventricular diastolic parameters are consistent with Grade I diastolic  dysfunction (impaired relaxation). Right Ventricle: The right ventricular size is normal. No increase in right ventricular wall thickness. Right ventricular systolic function is normal. There is normal pulmonary artery systolic pressure. The tricuspid regurgitant velocity is 2.08 m/s, and  with an assumed right atrial pressure of 8 mmHg, the estimated right ventricular systolic pressure is 25.3 mmHg. Left Atrium: Left atrial size was normal in size. Right Atrium: Right atrial size was normal in size. Pericardium: There is no evidence of pericardial effusion. Mitral Valve: The mitral valve is normal in structure. Mild mitral valve regurgitation. MV peak gradient, 6.7 mmHg. The mean mitral valve gradient is 2.0 mmHg. Tricuspid Valve: The tricuspid valve is normal in structure. Tricuspid valve regurgitation is mild. Aortic Valve: The aortic valve was not well visualized. Aortic valve regurgitation is not visualized. Aortic valve sclerosis/calcification is present, without any evidence of aortic stenosis. Aortic valve mean gradient measures 4.0 mmHg. Aortic valve peak gradient measures 7.6 mmHg. Aortic valve area, by VTI measures 2.27 cm. Pulmonic Valve: The pulmonic valve was normal in structure. Pulmonic valve regurgitation is not visualized. Aorta: The aortic root is normal in size and structure. Venous: The inferior vena cava is normal in size with greater than 50% respiratory variability, suggesting right atrial pressure of 3 mmHg. IAS/Shunts: No atrial level shunt detected by color flow Doppler.  LEFT VENTRICLE PLAX 2D LVIDd:         2.70 cm   Diastology LVIDs:         2.00 cm   LV e' medial:    4.79 cm/s LV PW:         1.60 cm   LV E/e' medial:  14.6 LV IVS:        1.80 cm   LV e' lateral:   3.92 cm/s LVOT diam:     2.00 cm   LV E/e' lateral: 17.8 LV SV:         73 LV SV Index:   40 LVOT Area:     3.14 cm  RIGHT VENTRICLE RV Basal diam:  3.80 cm RV Mid diam:    3.20 cm LEFT ATRIUM             Index        RIGHT  ATRIUM           Index LA diam:        3.50 cm 1.89 cm/m   RA Area:     15.00 cm LA Vol (A2C):   64.8 ml 35.06 ml/m  RA Volume:   36.80 ml  19.91 ml/m LA Vol (A4C):   45.6 ml 24.67 ml/m LA Biplane Vol: 59.0 ml 31.93 ml/m  AORTIC VALVE                    PULMONIC VALVE AV Area (Vmax):    2.87 cm     PV Vmax:       0.98 m/s AV Area (Vmean):   2.45 cm     PV Peak grad:  3.8 mmHg AV Area (VTI):     2.27 cm AV Vmax:           138.00 cm/s AV  Vmean:          91.700 cm/s AV VTI:            0.323 m AV Peak Grad:      7.6 mmHg AV Mean Grad:      4.0 mmHg LVOT Vmax:         126.00 cm/s LVOT Vmean:        71.600 cm/s LVOT VTI:          0.233 m LVOT/AV VTI ratio: 0.72  AORTA Ao Root diam: 2.60 cm MITRAL VALVE                TRICUSPID VALVE MV Area (PHT): 2.45 cm     TR Peak grad:   17.3 mmHg MV Area VTI:   2.67 cm     TR Vmax:        208.00 cm/s MV Peak grad:  6.7 mmHg MV Mean grad:  2.0 mmHg     SHUNTS MV Vmax:       1.29 m/s     Systemic VTI:  0.23 m MV Vmean:      60.6 cm/s    Systemic Diam: 2.00 cm MV Decel Time: 310 msec MV E velocity: 69.70 cm/s MV A velocity: 113.00 cm/s MV E/A ratio:  0.62 Debbe Odea MD Electronically signed by Debbe Odea MD Signature Date/Time: 05/11/2023/4:22:25 PM    Final    MR BRAIN WO CONTRAST  Result Date: 05/10/2023 CLINICAL DATA:  Initial evaluation for neuro deficit, stroke suspected. EXAM: MRI HEAD WITHOUT CONTRAST TECHNIQUE: Multiplanar, multiecho pulse sequences of the brain and surrounding structures were obtained without intravenous contrast. COMPARISON:  CT from earlier the same day. FINDINGS: Brain: Cerebral volume within normal limits. Patchy T2/FLAIR hyperintensity involving the supratentorial cerebral white matter, consistent with chronic small vessel ischemic disease, moderately advanced. No evidence for acute or subacute ischemia. No acute intracranial hemorrhage. Few chronic micro hemorrhages noted at the pons, likely hypertensive in nature. No mass  lesion, midline shift or mass effect. No hydrocephalus or extra-axial fluid collection. Empty sella noted. Vascular: Major intracranial vascular flow voids are maintained. Skull and upper cervical spine: Craniocervical junction normal. Bone marrow signal intensity within normal limits. No scalp soft tissue abnormality. Sinuses/Orbits: Prior bilateral ocular lens replacement. Scattered mucosal thickening present about the sphenoid ethmoidal sinuses with small air-fluid level within the left sphenoid sinus. No significant mastoid effusion. Other: None. IMPRESSION: 1. No acute intracranial abnormality. 2. Moderately advanced chronic microvascular ischemic disease. 3. Scattered paranasal sinus disease with small air-fluid level within the left sphenoid sinus. Clinical correlation for possible acute sinusitis recommended. Electronically Signed   By: Rise Mu M.D.   On: 05/10/2023 21:40   CT Head Wo Contrast  Result Date: 05/10/2023 CLINICAL DATA:  Hypertension neurological deficit, initial encounter EXAM: CT HEAD WITHOUT CONTRAST TECHNIQUE: Contiguous axial images were obtained from the base of the skull through the vertex without intravenous contrast. RADIATION DOSE REDUCTION: This exam was performed according to the departmental dose-optimization program which includes automated exposure control, adjustment of the mA and/or kV according to patient size and/or use of iterative reconstruction technique. COMPARISON:  02/02/2022 FINDINGS: Brain: No evidence of acute infarction, hemorrhage, hydrocephalus, extra-axial collection or mass lesion/mass effect. Mild atrophic changes are noted. Mild chronic white matter ischemic changes are seen as well. Vascular: No hyperdense vessel or unexpected calcification. Skull: Normal. Negative for fracture or focal lesion. Sinuses/Orbits: No acute finding. Other: None IMPRESSION: No acute intracranial abnormality noted. Chronic changes as  described. Electronically Signed    By: Alcide Clever M.D.   On: 05/10/2023 19:53     The results of significant diagnostics from this hospitalization (including imaging, microbiology, ancillary and laboratory) are listed below for reference.     Microbiology: No results found for this or any previous visit (from the past 240 hour(s)).   Labs: BNP (last 3 results) No results for input(s): "BNP" in the last 8760 hours. Basic Metabolic Panel: Recent Labs  Lab 05/10/23 1811 05/12/23 0505 05/13/23 0258  NA 139 138 138  K 4.0 3.2* 4.0  CL 101 102 104  CO2 26 28 24   GLUCOSE 125* 114* 111*  BUN 13 14 20   CREATININE 0.69 0.98 1.08*  CALCIUM 8.9 8.5* 8.3*  MG  --  1.7 1.9   Liver Function Tests: No results for input(s): "AST", "ALT", "ALKPHOS", "BILITOT", "PROT", "ALBUMIN" in the last 168 hours. No results for input(s): "LIPASE", "AMYLASE" in the last 168 hours. No results for input(s): "AMMONIA" in the last 168 hours. CBC: Recent Labs  Lab 05/10/23 1811 05/12/23 0505 05/13/23 0258  WBC 6.7 6.0 7.2  HGB 14.1 12.6 12.2  HCT 43.3 38.9 36.7  MCV 85.7 84.6 82.8  PLT 204 177 204   Cardiac Enzymes: No results for input(s): "CKTOTAL", "CKMB", "CKMBINDEX", "TROPONINI" in the last 168 hours. BNP: Invalid input(s): "POCBNP" CBG: Recent Labs  Lab 05/12/23 0719 05/12/23 1207 05/12/23 1600 05/12/23 2037 05/13/23 0817  GLUCAP 131* 155* 238* 112* 142*   D-Dimer No results for input(s): "DDIMER" in the last 72 hours. Hgb A1c Recent Labs    05/10/23 1811  HGBA1C 6.5*   Lipid Profile No results for input(s): "CHOL", "HDL", "LDLCALC", "TRIG", "CHOLHDL", "LDLDIRECT" in the last 72 hours. Thyroid function studies Recent Labs    05/10/23 1811  TSH 2.348   Anemia work up No results for input(s): "VITAMINB12", "FOLATE", "FERRITIN", "TIBC", "IRON", "RETICCTPCT" in the last 72 hours. Urinalysis    Component Value Date/Time   COLORURINE STRAW (A) 05/11/2023 0806   APPEARANCEUR CLEAR (A) 05/11/2023 0806    APPEARANCEUR Cloudy (A) 05/04/2022 1107   LABSPEC 1.009 05/11/2023 0806   LABSPEC 1.010 02/22/2012 1204   PHURINE 7.0 05/11/2023 0806   GLUCOSEU NEGATIVE 05/11/2023 0806   GLUCOSEU Negative 02/22/2012 1204   HGBUR NEGATIVE 05/11/2023 0806   BILIRUBINUR NEGATIVE 05/11/2023 0806   BILIRUBINUR Negative 05/04/2022 1107   BILIRUBINUR Negative 02/22/2012 1204   KETONESUR 5 (A) 05/11/2023 0806   PROTEINUR NEGATIVE 05/11/2023 0806   NITRITE NEGATIVE 05/11/2023 0806   LEUKOCYTESUR NEGATIVE 05/11/2023 0806   LEUKOCYTESUR 2+ 02/22/2012 1204   Sepsis Labs Recent Labs  Lab 05/10/23 1811 05/12/23 0505 05/13/23 0258  WBC 6.7 6.0 7.2   Microbiology No results found for this or any previous visit (from the past 240 hour(s)).   Time coordinating discharge:  I have spent 35 minutes face to face with the patient and on the ward discussing the patients care, assessment, plan and disposition with other care givers. >50% of the time was devoted counseling the patient about the risks and benefits of treatment/Discharge disposition and coordinating care.   SIGNED:   Miguel Rota, MD  [facility] 05/13/2023, 12:21 PM   If 7PM-7AM, please contact night-coverage

## 2023-05-13 NOTE — TOC Progression Note (Signed)
Transition of Care [facility]) - Progression Note    Patient Details  Name: Brandi [name] MRN: [id number] Date of Birth: 1935/11/10  Transition of [facility]) CM/SW Contact  Bing Quarry, RN Phone Number: 05/13/2023, 11:53 AM  Clinical Narrative: 11/24: [facility] via [place] accepted for [facility]. Will need orders at discharge.   Gabriel Cirri MSN RN CM  Care Management Department.  [place]  [facility] Direct Dial: [phone] Main Office Phone: [phone] Weekends Only             Expected Discharge Plan and Services         Expected Discharge Date: 05/13/23                                     Social Determinants of Health (SDOH) Interventions SDOH Screenings   Food Insecurity: No Food Insecurity (05/11/2023)  Housing: Low Risk  (05/11/2023)  Transportation Needs: No Transportation Needs (05/11/2023)  Utilities: Not At Risk (05/11/2023)  Depression (PHQ2-9): Low Risk  (05/30/2019)  Financial Resource Strain: Low Risk  (02/22/2023)   Received from [facility]  Tobacco Use: Low Risk  (05/11/2023)    Readmission Risk Interventions     No data to display

## 2023-05-30 ENCOUNTER — Emergency Department: Payer: Medicare PPO

## 2023-05-30 ENCOUNTER — Emergency Department
Admission: EM | Admit: 2023-05-30 | Discharge: 2023-05-30 | Disposition: A | Discharge status: Home / Self Care | Payer: Medicare PPO | Attending: Emergency Medicine | Admitting: Emergency Medicine

## 2023-05-30 ENCOUNTER — Other Ambulatory Visit: Payer: Self-pay

## 2023-05-30 DIAGNOSIS — R0600 Dyspnea, unspecified: Secondary | ICD-10-CM

## 2023-05-30 DIAGNOSIS — R110 Nausea: Secondary | ICD-10-CM | POA: Diagnosis not present

## 2023-05-30 DIAGNOSIS — E119 Type 2 diabetes mellitus without complications: Secondary | ICD-10-CM | POA: Insufficient documentation

## 2023-05-30 DIAGNOSIS — I2510 Atherosclerotic heart disease of native coronary artery without angina pectoris: Secondary | ICD-10-CM | POA: Diagnosis not present

## 2023-05-30 DIAGNOSIS — I10 Essential (primary) hypertension: Secondary | ICD-10-CM | POA: Diagnosis not present

## 2023-05-30 DIAGNOSIS — R0602 Shortness of breath: Secondary | ICD-10-CM | POA: Insufficient documentation

## 2023-05-30 LAB — BASIC METABOLIC PANEL
Anion gap: 10 (ref 5–15)
BUN: 12 mg/dL (ref 8–23)
CO2: 24 mmol/L (ref 22–32)
Calcium: 8.8 mg/dL — ABNORMAL LOW (ref 8.9–10.3)
Chloride: 103 mmol/L (ref 98–111)
Creatinine, Ser: 0.97 mg/dL (ref 0.44–1.00)
GFR, Estimated: 57 mL/min — ABNORMAL LOW (ref >60)
Glucose, Bld: 179 mg/dL — ABNORMAL HIGH (ref 70–99)
Potassium: 4.2 mmol/L (ref 3.5–5.1)
Sodium: 137 mmol/L (ref 135–145)

## 2023-05-30 LAB — CBC
HCT: 40 % (ref 36.0–46.0)
Hemoglobin: 12.8 g/dL (ref 12.0–15.0)
MCH: 27.9 pg (ref 26.0–34.0)
MCHC: 32 g/dL (ref 30.0–36.0)
MCV: 87.1 fL (ref 80.0–100.0)
Platelets: 183 10*3/uL (ref 150–400)
RBC: 4.59 MIL/uL (ref 3.87–5.11)
RDW: 14.6 % (ref 11.5–15.5)
WBC: 5.6 10*3/uL (ref 4.0–10.5)
nRBC: 0 % (ref 0.0–0.2)

## 2023-05-30 LAB — TROPONIN I (HIGH SENSITIVITY): Troponin I (High Sensitivity): 4 ng/L (ref <18)

## 2023-05-30 NOTE — ED Provider Notes (Signed)
[facility] Provider Note    Event Date/Time   First MD Initiated Contact with Patient 05/30/23 1515     (approximate)  History   Chief Complaint: Shortness of Breath  HPI  Brandi [name] is a 87 y.o. female with a past medical history of anxiety, atrial fibrillation, CAD, diabetes, hypertension, hyperlipidemia, prior MI, presents to the emergency department for some shortness of breath and nausea.  According to the patient she was at a routine neurology follow-up today when she began feeling somewhat nauseated.  Patient states over the weekend she has been experiencing diarrhea today with nausea.  Patient states she was feeling some shortness of breath although states this is fairly chronic for her and denies any shortness of breath currently denies any chest pain at any point.  Patient states they checked her blood pressure and it had elevated to around 148 so they sent her to the emergency department for evaluation.  Patient denies any weakness or numbness confusion slurred speech or headache.  No chest pain or diaphoresis.  Denies any shortness of breath currently denies any nausea currently.  States she is feeling well and is ready to go home.  Physical Exam   Triage Vital Signs: ED Triage Vitals  Encounter Vitals Group     BP 05/30/23 1207 135/68     Systolic BP Percentile --      Diastolic BP Percentile --      Pulse Rate 05/30/23 1207 70     Resp 05/30/23 1207 20     Temp 05/30/23 1208 97.9 F (36.6 C)     Temp src --      SpO2 05/30/23 1207 95 %     Weight 05/30/23 1209 195 lb (88.5 kg)     Height 05/30/23 1209 5\' 1"  (1.549 m)     Head Circumference --      Peak Flow --      Pain Score 05/30/23 1209 4     Pain Loc --      Pain Education --      Exclude from Growth Chart --     Most recent vital signs: Vitals:   05/30/23 1207 05/30/23 1208  BP: 135/68   Pulse: 70   Resp: 20   Temp:  97.9 F (36.6 C)  SpO2: 95%     General: Awake, no  distress.  CV:  Good peripheral perfusion.  Regular rate and rhythm  Resp:  Normal effort.  Equal breath sounds bilaterally.  Abd:  No distention.  Soft, nontender.  No rebound or guarding. Other:  Equal grip strength bilaterally.  No pronator drift.  5/5 motor in extremities.  No obvious cranial nerve deficits.  ED Results / Procedures / Treatments   EKG  EKG viewed and interpreted by myself shows a normal sinus rhythm at 68 bpm the narrow QRS, normal axis, normal intervals, no concerning ST changes.  RADIOLOGY  I have reviewed and interpreted the chest x-ray images.  No obvious consolidation on my evaluation, sternal wires in place. Radiology has read the x-ray as no acute process.   MEDICATIONS ORDERED IN ED: Medications - No data to display   IMPRESSION / MDM / ASSESSMENT AND PLAN / ED COURSE  I reviewed the triage vital signs and the nursing notes.  Patient's presentation is most consistent with acute presentation with potential threat to life or bodily function.  Patient presents to the emergency department for sensation of nausea and some shortness of breath  while at her neurology appointment earlier today.  States her blood pressure had gone up to 148 systolic and they sent her to the emergency department for evaluation.  Here the patient states she is feeling much better denies any nausea denies any shortness of breath.  She states that shortness of breath has been chronic and intermittent for her for many years now.  Denies any chest pain at any point.  No neurologic symptoms.  Here the patient appears well, reassuring physical exam, reassuring neurological exam.  She has no symptoms her vital signs are reassuring and her lab work is reassuring as well with a normal CBC reassuring chemistry negative troponin.  Chest x-ray is clear EKG reassuring.  Given the patient's reassuring workup and as she is some temporary I believe the patient will be safe for discharge home with  outpatient follow-up.  I discussed strict return precautions.  Patient agreeable to plan of care.  FINAL CLINICAL IMPRESSION(S) / ED DIAGNOSES   Dyspnea Nausea   Note:  This document was prepared using Dragon voice recognition software and may include unintentional dictation errors.   Minna Antis, MD 05/30/23 1534

## 2023-05-30 NOTE — Discharge Instructions (Addendum)
Please follow-up with your doctor within the next 1 to 2 days for recheck/reevaluation.  As we discussed return to the emergency department for any return of shortness of breath development of any chest pain any weakness or numbness of any arm or leg confusion slurred speech or any other symptom personally concerning to yourself.

## 2023-05-30 NOTE — ED Triage Notes (Signed)
Pt to ED from outpatient neurology apt from nausea and shob. Pt reports has sob intermittently. +h/a

## 2023-06-05 NOTE — Progress Notes (Deleted)
06/07/2023 9:43 AM   Brandi [name] 10-Sep-1935 [id number]  Referring provider: Gracelyn Nurse, MD [address] [facility] [place],  [place] [address]  Urological history: 1. rUTI's -Contributing factors of age, vaginal atrophy, diabetes, incontinence and constipation -Documented urine cultures over the last year  February 22, 2023 E. coli  2.  Mixed incontinence -Contributing factors of age, vaginal atrophy, diabetes, cystocele, HTN, sleep apnea (CPAP), arthritis, obesity, depression and anxiety  3. Nocturia -Risk factors for nocturia: obstructive sleep apnea (CPAP), hypertension, diabetes, arthritis, heart disease, anxiety and depression  4. Right renal cysts -contrast CT (09/2020) - Right renal cysts   No chief complaint on file.  HPI: Brandi [name] is a 87 y.o. female who presents today for yearly follow up with her husband, Brandi [name].   Previous records reviewed.   She had been hospitalized for hypertensive emergency since I last seen her.  At her visit on 05/08/2023, she is having 1-7 daytime voids, 1-2 episodes of nocturia with strong urge to urinate.  She has both stress and urge incontinence.  She leaking's 1-2 times daily.  She wears depends 3 daily.  She does not limit her fluid intake.  She does engage in toilet mapping.  She is having infrequent episode of high volume incontinence.  Patient denies any modifying or aggravating factors.  Patient denies any recent UTI's, gross hematuria, dysuria or suprapubic/flank pain.  Patient denies any fevers, chills, nausea or vomiting.  She is not sure if the Myrbetriq 50 mg daily is "doing anything for her."  PVR 0 mL.  We discussed having a drug holiday and she discontinued the Myrbetriq for 1 month.  PVR ***  PMH: Past Medical History:  Diagnosis Date   Acquired cyst of kidney    Anemia    Anxiety    Aortic atherosclerosis (HCC)    Arthritis, degenerative    Atrial fibrillation and flutter (HCC)    a.)  CHA2DS2VASc = 6 (age x 2, sex, HTN, prior MI, T2DM);  b.) s/p LAA closure duirng CABG 05/29/2018; c.) rate/rhythm maintained on oral carvedilol + verapamil; no chronic anticoagulation s/p LAA closure   Baden-Walker grade 3 cystocele    Chronic cystitis    Coronary artery disease 08/05/2010   a.) LHC/PCI 08/05/2010: 10% oLM, 70% pLAD-1 (3.0 x 23 mm Xience V DES), 75% pLAD-2, 40% mLAD, 50% dLAD; b.) LHC 07/03/2011: 10% oLM, 10% ISR pLAD, 50% mLAD, LCx with min luminal irregs - med mgmt; c.) LHC 05/22/2018: 80% mLCx, 70% OM3, 90% oLAD, 60% pLAD, 60% ost 1st sep, 90% p-mRCA, 40% o-pLAD --> CVTS; d.) s/p 3v CABG 05/29/2018   Depression    Diabetic neuropathy (HCC)    Dyspnea    GERD (gastroesophageal reflux disease)    Headache    History of left atrial appendage closure 05/29/2018   HLD (hyperlipidemia)    HTN (hypertension)    Implantable loop recorder present 09/20/2016   a.) has reached EOL; no longer funcitoning/being interrogated   Insomnia    a.) uses melatonin PRN   Murmur    NSTEMI (non-ST elevated myocardial infarction) (HCC) 05/20/2018   a.) troponins were trended: 0.30 --> 0.25 --> 0.22 --> 0.24 ng/L; b.) LHC 05/22/2018: 80% mLCx, 70% OM3, 90% oLAD, 60% pLAD, 60% ost 1st sep, 90% p-mRCA, 40% o-pLAD --> CVTS consult; c.) 3v CABG 05/29/2018: LIMA-LAD, SVG-OM1, SVG-RCA   NSVT (nonsustained ventricular tachycardia) (HCC) 01/2022   a.) holter 01/2022: rates as fast as 195 bpm lasting  up to 12 beats   Obesity    OSA on CPAP    PONV (postoperative nausea and vomiting)    Presence of drug coated stent in LAD coronary artery    PSVT (paroxysmal supraventricular tachycardia) (HCC) 01/2022   a.) holter 01/2022: rates as fast as 195 bpm lasting up to 12 beats   S/P CABG x 3 05/29/2018   a.) 3v CABG at [facility] 05/29/2018: LIMA-LAD, SVG-OM1, SVG-RCA   Sepsis (HCC)    Spinal stenosis    Syncopal episodes 10/2022   Syncope    T2DM (type 2 diabetes mellitus) (HCC)    Unstable angina (HCC)     Urge incontinence of urine     Surgical History: Past Surgical History:  Procedure Laterality Date   BREAST BIOPSY Right 2004   neg   BUNIONECTOMY     CHOLECYSTECTOMY     COLONOSCOPY WITH PROPOFOL N/A 06/14/2017   Procedure: COLONOSCOPY WITH PROPOFOL;  Surgeon: Christena Deem, MD;  Location: [facility] ENDOSCOPY;  Service: Endoscopy;  Laterality: N/A;   CORONARY ANGIOPLASTY WITH STENT PLACEMENT Left 08/05/2010   Procedure: CORONARY ANGIOPLASTY WITH STENT PLACEMENT; Location: [facility]; Surgeon(s): Harold Hedge, MD (diagnostic) and Rudean Hitt, MD (interventional)   CORONARY ARTERY BYPASS GRAFT N/A 05/29/2018   Procedure: CORONARY ARTERY BYPASS GRAFT; Location: [facility]; Surgeon: Flint Melter, MD   HYSTEROSCOPY WITH D & C N/A 12/14/2022   Procedure: FRACTIONAL DILATATION AND CURETTAGE /HYSTEROSCOPY;  Surgeon: Suzy Bouchard, MD;  Location: [facility] ORS;  Service: Gynecology;  Laterality: N/A;   LEFT ATRIAL APPENDAGE OCCLUSION Left 05/29/2018   Procedure: LEFT ATRIAL APPENDAGE CLOSURE; Location: [facility]; Surgeon: Flint Melter, MD   LEFT HEART CATH AND CORONARY ANGIOGRAPHY N/A 05/22/2018   Procedure: LEFT HEART CATH AND CORONARY ANGIOGRAPHY;  Surgeon: Lamar Blinks, MD;  Location: [facility] INVASIVE CV LAB;  Service: Cardiovascular;  Laterality: N/A;   LEFT HEART CATH AND CORONARY ANGIOGRAPHY Left 07/03/2011   Procedure: LEFT HEART CATH AND CORONARY ANGIOGRAPHY; Location: [facility]; Surgeon: Harold Hedge, MD   LEFT HEART CATH AND CORONARY ANGIOGRAPHY Left 05/22/2018   Procedure: LEFT HEART CATH AND CORONARY ANGIOGRAPHY; Location: [facility]; Surgeon: Harold Hedge, MD   LOOP RECORDER INSERTION N/A 09/20/2016   Procedure: Loop Recorder Insertion;  Surgeon: Marcina Millard, MD;  Location: [facility] INVASIVE CV LAB;  Service: Cardiovascular;  Laterality: N/A;   SEPTOPLASTY     TOTAL KNEE ARTHROPLASTY Bilateral    XI ROBOTIC ASSISTED INGUINAL HERNIA REPAIR WITH MESH Left 10/14/2020   Procedure: XI ROBOTIC  ASSISTED INGUINAL HERNIA REPAIR WITH MESH;  Surgeon: Campbell Lerner, MD;  Location: [facility] ORS;  Service: General;  Laterality: Left;    Home Medications:  Allergies as of 06/07/2023       Reactions   Accupril [quinapril Hcl] Rash   Micardis [telmisartan] Other (See Comments)   Dizziness   Quinapril Rash        Medication List        Accurate as of June 05, 2023  9:43 AM. If you have any questions, ask your nurse or doctor.          acetaminophen 500 MG tablet Commonly known as: TYLENOL Take 1,000 mg by mouth 3 (three) times daily.   aspirin EC 81 MG tablet Take 81 mg by mouth daily.   atorvastatin 40 MG tablet Commonly known as: LIPITOR Take 40 mg by mouth every evening.   budesonide-formoterol 80-4.5 MCG/ACT inhaler Commonly known as: SYMBICORT Inhale 2 puffs into the lungs 2 (two) times daily.  CALCIUM CARBONATE+VITAMIN D PO Take 1 tablet by mouth daily at 6 (six) AM.   carboxymethylcellulose 0.5 % Soln Commonly known as: REFRESH PLUS Apply 1-2 drops to eye at bedtime.   carvedilol 25 MG tablet Commonly known as: COREG Take 1 tablet (25 mg total) by mouth 2 (two) times daily with a meal.   famotidine 20 MG tablet Commonly known as: PEPCID Take 20 mg by mouth 2 (two) times daily.   ferrous sulfate 325 (65 FE) MG tablet Take 325 mg by mouth daily with breakfast.   gabapentin 400 MG capsule Commonly known as: NEURONTIN Take 400 mg by mouth 3 (three) times daily.   glipiZIDE 5 MG tablet Commonly known as: GLUCOTROL Take 1 tablet (5 mg total) by mouth daily. What changed: when to take this   hydrALAZINE 10 MG tablet Commonly known as: APRESOLINE Take 1 tablet (10 mg total) by mouth 3 (three) times daily.   Janumet XR 50-1000 MG Tb24 Generic drug: SitaGLIPtin-MetFORMIN HCl Take 1 tablet by mouth 2 (two) times daily.   Lantus SoloStar 100 UNIT/ML Solostar Pen Generic drug: insulin glargine Inject 10 Units into the skin every morning.    loratadine 10 MG tablet Commonly known as: CLARITIN Take 10 mg by mouth every morning.   losartan 100 MG tablet Commonly known as: COZAAR Take 100 mg by mouth at bedtime.   meclizine 12.5 MG tablet Commonly known as: ANTIVERT Take 12.5 mg by mouth 3 (three) times daily as needed for dizziness.   Melatonin 10 MG Tabs Take 1 tablet by mouth at bedtime.   mirabegron ER 50 MG Tb24 tablet Commonly known as: MYRBETRIQ Take 1 tablet (50 mg total) by mouth daily. What changed: when to take this   nitroGLYCERIN 0.4 MG SL tablet Commonly known as: NITROSTAT Place 0.4 mg under the tongue every 5 (five) minutes as needed for chest pain.   PreserVision/Lutein Caps Take 1 capsule by mouth 2 (two) times daily.   progesterone 100 MG capsule Commonly known as: PROMETRIUM Take 100 mg by mouth at bedtime.   sodium chloride 0.65 % nasal spray Commonly known as: OCEAN Place 1 spray into the nose as needed.   Verapamil HCl CR 300 MG Cp24 Take 1 capsule by mouth at bedtime.   VITAMIN B-12 PO Take 1 tablet by mouth daily.        Allergies:  Allergies  Allergen Reactions   Accupril [Quinapril Hcl] Rash   Micardis [Telmisartan] Other (See Comments)    Dizziness   Quinapril Rash    Family History: Family History  Problem Relation Age of Onset   Breast cancer Cousin    Skin cancer Other    Diabetes Other    Hypercholesterolemia Other    Hypertension Other    Bladder Cancer Maternal Aunt    COPD Other    Kidney disease Neg Hx    Kidney cancer Neg Hx     Social History:  reports that she has never smoked. She has never used smokeless tobacco. She reports that she does not drink alcohol and does not use drugs.  ROS: Pertinent ROS in HPI  Physical Exam: There were no vitals taken for this visit.  Constitutional:  Well nourished. Alert and oriented, No acute distress. HEENT: [place] AT, moist mucus membranes.  Trachea midline, no masses. Cardiovascular: No clubbing, cyanosis,  or edema. Respiratory: Normal respiratory effort, no increased work of breathing. GU: No CVA tenderness.  No bladder fullness or masses.  Recession of labia minora,  dry, pale vulvar vaginal mucosa and loss of mucosal ridges and folds.  Normal urethral meatus, no lesions, no prolapse, no discharge.   No urethral masses, tenderness and/or tenderness. No bladder fullness, tenderness or masses. *** vagina mucosa, *** estrogen effect, no discharge, no lesions, *** pelvic support, *** cystocele and *** rectocele noted.  No cervical motion tenderness.  Uterus is freely mobile and non-fixed.  No adnexal/parametria masses or tenderness noted.  Anus and perineum are without rashes or lesions.   ***  Neurologic: Grossly intact, no focal deficits, moving all 4 extremities. Psychiatric: Normal mood and affect.    Laboratory data: CBC    Component Value Date/Time   WBC 5.6 05/30/2023 1208   RBC 4.59 05/30/2023 1208   HGB 12.8 05/30/2023 1208   HGB 13.3 02/22/2012 1233   HCT 40.0 05/30/2023 1208   HCT 39.4 02/22/2012 1233   PLT 183 05/30/2023 1208   PLT 189 02/22/2012 1233   MCV 87.1 05/30/2023 1208   MCV 77 (L) 02/22/2012 1233   MCH 27.9 05/30/2023 1208   MCHC 32.0 05/30/2023 1208   RDW 14.6 05/30/2023 1208   RDW 15.2 (H) 02/22/2012 1233   LYMPHSABS 2.1 02/06/2022 0536   LYMPHSABS 0.6 (L) 02/22/2012 1233   MONOABS 0.7 02/06/2022 0536   MONOABS 1.0 (H) 02/22/2012 1233   EOSABS 0.1 02/06/2022 0536   EOSABS 0.0 02/22/2012 1233   BASOSABS 0.0 02/06/2022 0536   BASOSABS 0.0 02/22/2012 1233    BMET    Component Value Date/Time   NA 137 05/30/2023 1208   NA 128 (L) 02/22/2012 1233   K 4.2 05/30/2023 1208   K 4.5 07/09/2012 1127   CL 103 05/30/2023 1208   CL 91 (L) 02/22/2012 1233   CO2 24 05/30/2023 1208   CO2 27 02/22/2012 1233   GLUCOSE 179 (H) 05/30/2023 1208   GLUCOSE 192 (H) 02/22/2012 1233   BUN 12 05/30/2023 1208   BUN 6 (L) 02/22/2012 1233   CREATININE 0.97 05/30/2023 1208    CREATININE 0.65 02/22/2012 1233   CALCIUM 8.8 (L) 05/30/2023 1208   CALCIUM 9.1 02/22/2012 1233   GFRNONAA 57 (L) 05/30/2023 1208   GFRNONAA >60 02/22/2012 1233  I have reviewed the labs.  Pertinent Imaging: ***   Assessment & Plan:    1. Mixed incontinence -She is not certain if the Myrbetriq is helping her urinary symptoms -We discussed how her diabetes comes into play regarding her urinary symptoms -We also discussed taking a drug holiday from the Myrbetriq to see if it is adding benefit -She will discontinue the Myrbetriq for 30 days and then we will reassess  2. Nocturia -not bothersome    No follow-ups on file.  These notes generated with voice recognition software. I apologize for typographical errors.  Cloretta Ned  [facility] [address]  [address] [place], [place] [address] [phone]

## 2023-06-07 ENCOUNTER — Ambulatory Visit: Payer: Medicare PPO | Admitting: Urology

## 2023-06-19 ENCOUNTER — Other Ambulatory Visit: Payer: Self-pay

## 2023-06-19 DIAGNOSIS — N3946 Mixed incontinence: Secondary | ICD-10-CM

## 2023-06-19 MED ORDER — MIRABEGRON ER 50 MG PO TB24: 50.0000 mg | ORAL_TABLET | Freq: Every day | ORAL | 3 refills | Status: DC

## 2023-08-14 NOTE — Progress Notes (Unsigned)
 Patient: Brandi [name]  Service Category: E/M  Provider: Oswaldo Done, MD  DOB: Jun 28, 1935  DOS: 08/15/2023  Referring Provider: Karenann Cai, *  MRN: [id number]  Setting: Ambulatory outpatient  PCP: Gracelyn Nurse, MD  Type: New Patient  Specialty: Interventional Pain Management    Location: Office  Delivery: Face-to-face     Primary Reason(s) for Visit: Encounter for initial evaluation of one or more chronic problems (new to examiner) potentially causing chronic pain, and posing a threat to normal musculoskeletal function. (Level of risk: High) CC: No chief complaint on file.  HPI  Brandi [name] is a 88 y.o. year old, female patient, who comes for the first time to our practice referred by Abd-El-Barr, Jerolyn Center, * for our initial evaluation of her chronic pain. She has Urinary tract infection without hematuria; Cystocele, grade 3; Incontinence; Severe sepsis (HCC); Acquired cyst of kidney; Action tremor; Benign essential hypertension; Chronic cystitis; Headache disorder; Coronary arteriosclerosis; Depression, major, in remission (HCC); Diabetic neuropathy (HCC); Obesity, unspecified; Orthostatic hypotension; Pernicious anemia; Positional lightheadedness; OSA on CPAP; Diabetes mellitus type 2, insulin dependent (HCC); Chest pain; Unstable angina (HCC); Recurrent syncope; CAD s/p CABG (coronary artery disease); AKI (acute kidney injury) (HCC); Acute hyponatremia; Dehydration; Diarrhea; Abdominal pain; Anxiety; Atrial fibrillation and flutter (HCC); Background diabetic retinopathy associated with type 2 diabetes mellitus (HCC); Chronic kidney disease, stage III (moderate) (HCC); Decreased activities of daily living (ADL); Decreased strength, endurance, and mobility; H/O gastroesophageal reflux (GERD); Hyperlipidemia; Presence of drug coated stent in LAD coronary artery; S/P CABG x 3; Sacroiliitis (HCC); Urge incontinence; Status post laparoscopic hernia repair; Obesity (BMI 30-39.9); Anemia of  chronic disease; HTN (hypertension), malignant; Hypertensive emergency; Peripheral vertigo; Acute sinusitis; A-fib s/p ligation of left atrial appendage; Obesity, Class III, BMI 40-49.9 (morbid obesity) (HCC); Chronic back pain; and Thickened endometrium on their problem list. Today she comes in for evaluation of her No chief complaint on file.  Pain Assessment: Location:     Radiating:   Onset:   Duration:   Quality:   Severity:  /10 (subjective, self-reported pain score)  Effect on ADL:   Timing:   Modifying factors:   BP:    HR:    Onset and Duration: {Hx; Onset and Duration:210120511} Cause of pain: {Hx; Cause:210120521} Severity: {Pain Severity:210120502} Timing: {Symptoms; Timing:210120501} Aggravating Factors: {Causes; Aggravating pain factors:210120507} Alleviating Factors: {Causes; Alleviating Factors:210120500} Associated Problems: {Hx; Associated problems:210120515} Quality of Pain: {Hx; Symptom quality or Descriptor:210120531} Previous Examinations or Tests: {Hx; Previous examinations or test:210120529} Previous Treatments: {Hx; Previous Treatment:210120503}  Brandi [name] is being evaluated for possible interventional pain management therapies for the treatment of her chronic pain.  Discussed the use of AI scribe software for clinical note transcription with the patient, who gave verbal consent to proceed.  History of Present Illness           *** Brandi [name] has been informed that this initial visit was an evaluation only.  On the follow up appointment I will go over the results, including ordered tests and available interventional therapies. At that time she will have the opportunity to decide whether to proceed with offered therapies or not. In the event that Brandi [name] prefers avoiding interventional options, this will conclude our involvement in the case.  Medication management recommendations may be provided upon request.  Patient informed that diagnostic tests may be  ordered to assist in identifying underlying causes, narrow the list of differential diagnoses and aid in determining candidacy for (or contraindications to) planned therapeutic interventions.  Historic Controlled Substance Pharmacotherapy Review  PMP and historical list of controlled substances: Gabapentin 400 mg capsule, 1 cap p.o. 3 times daily (90/month) (last filled on 07/25/2023) Most recently prescribed opioid analgesics:   None MME/day: 0 mg/day  Historical Monitoring: The patient  reports no history of drug use. List of prior UDS Testing: No results found for: "MDMA", "COCAINSCRNUR", "PCPSCRNUR", "PCPQUANT", "CANNABQUANT", "THCU", "ETH", "CBDTHCR", "D8THCCBX", "D9THCCBX" Historical Background Evaluation: [place] PMP: PDMP reviewed during this encounter. Review of the past 27-months conducted.             PMP NARX Score Report:  Narcotic: 000 Sedative: 000 Stimulant: 000 [place] Department of public safety, offender search: Engineer, mining Information) Non-contributory Risk Assessment Profile: Aberrant behavior: None observed or detected today Risk factors for fatal opioid overdose: None identified today PMP NARX Overdose Risk Score: 000 Fatal overdose hazard ratio (HR): Calculation deferred Non-fatal overdose hazard ratio (HR): Calculation deferred Risk of opioid abuse or dependence: 0.7-3.0% with doses <= 36 MME/day and 6.1-26% with doses >= 120 MME/day. Substance use disorder (SUD) risk level: See below Personal History of Substance Abuse (SUD-Substance use disorder):  Alcohol:    Illegal Drugs:    Rx Drugs:    ORT Risk Level calculation:    ORT Scoring interpretation table:  Score <3 = Low Risk for SUD  Score between 4-7 = Moderate Risk for SUD  Score >8 = High Risk for Opioid Abuse   PHQ-2 Depression Scale:  Total score:    PHQ-2 Scoring interpretation table: (Score and probability of major depressive disorder)  Score 0 = No depression  Score 1 = 15.4% Probability  Score 2 = 21.1%  Probability  Score 3 = 38.4% Probability  Score 4 = 45.5% Probability  Score 5 = 56.4% Probability  Score 6 = 78.6% Probability   PHQ-9 Depression Scale:  Total score:    PHQ-9 Scoring interpretation table:  Score 0-4 = No depression  Score 5-9 = Mild depression  Score 10-14 = Moderate depression  Score 15-19 = Moderately severe depression  Score 20-27 = Severe depression (2.4 times higher risk of SUD and 2.89 times higher risk of overuse)   Pharmacologic Plan: As per protocol, I have not taken over any controlled substance management, pending the results of ordered tests and/or consults.            Initial impression: Pending review of available data and ordered tests.  Meds   Current Outpatient Medications:    acetaminophen (TYLENOL) 500 MG tablet, Take 1,000 mg by mouth 3 (three) times daily., Disp: , Rfl:    aspirin 81 MG EC tablet, Take 81 mg by mouth daily. , Disp: , Rfl:    atorvastatin (LIPITOR) 40 MG tablet, Take 40 mg by mouth every evening., Disp: , Rfl:    budesonide-formoterol (SYMBICORT) 80-4.5 MCG/ACT inhaler, Inhale 2 puffs into the lungs 2 (two) times daily., Disp: , Rfl:    Calcium Carb-Cholecalciferol (CALCIUM CARBONATE+VITAMIN D PO), Take 1 tablet by mouth daily at 6 (six) AM., Disp: , Rfl:    carboxymethylcellulose (REFRESH PLUS) 0.5 % SOLN, Apply 1-2 drops to eye at bedtime. , Disp: , Rfl:    carvedilol (COREG) 25 MG tablet, Take 1 tablet (25 mg total) by mouth 2 (two) times daily with a meal., Disp: 60 tablet, Rfl: 0   Cyanocobalamin (VITAMIN B-12 PO), Take 1 tablet by mouth daily., Disp: , Rfl:    famotidine (PEPCID) 20 MG tablet, Take 20 mg by mouth 2 (two) times daily.,  Disp: , Rfl:    ferrous sulfate 325 (65 FE) MG tablet, Take 325 mg by mouth daily with breakfast., Disp: , Rfl:    gabapentin (NEURONTIN) 400 MG capsule, Take 400 mg by mouth 3 (three) times daily., Disp: , Rfl:    glipiZIDE (GLUCOTROL) 5 MG tablet, Take 1 tablet (5 mg total) by mouth daily.  (Patient taking differently: Take 5 mg by mouth daily before breakfast.), Disp: 30 tablet, Rfl: 2   hydrALAZINE (APRESOLINE) 10 MG tablet, Take 1 tablet (10 mg total) by mouth 3 (three) times daily., Disp: 90 tablet, Rfl: 0   Insulin Glargine (LANTUS SOLOSTAR) 100 UNIT/ML Solostar Pen, Inject 10 Units into the skin every morning., Disp: , Rfl:    JANUMET XR 50-1000 MG TB24, Take 1 tablet by mouth 2 (two) times daily., Disp: , Rfl:    loratadine (CLARITIN) 10 MG tablet, Take 10 mg by mouth every morning., Disp: , Rfl:    losartan (COZAAR) 100 MG tablet, Take 100 mg by mouth at bedtime., Disp: , Rfl:    meclizine (ANTIVERT) 12.5 MG tablet, Take 12.5 mg by mouth 3 (three) times daily as needed for dizziness., Disp: , Rfl:    Melatonin 10 MG TABS, Take 1 tablet by mouth at bedtime., Disp: , Rfl:    mirabegron ER (MYRBETRIQ) 50 MG TB24 tablet, Take 1 tablet (50 mg total) by mouth daily., Disp: 90 tablet, Rfl: 3   Multiple Vitamins-Minerals (PRESERVISION/LUTEIN) CAPS, Take 1 capsule by mouth 2 (two) times daily., Disp: 60 capsule, Rfl: 1   nitroGLYCERIN (NITROSTAT) 0.4 MG SL tablet, Place 0.4 mg under the tongue every 5 (five) minutes as needed for chest pain., Disp: , Rfl:    progesterone (PROMETRIUM) 100 MG capsule, Take 100 mg by mouth at bedtime., Disp: , Rfl:    sodium chloride (OCEAN) 0.65 % nasal spray, Place 1 spray into the nose as needed., Disp: , Rfl:    Verapamil HCl CR 300 MG CP24, Take 1 capsule by mouth at bedtime., Disp: , Rfl:   Imaging Review  Cervical Imaging: Cervical CT wo contrast: Results for orders placed during the hospital encounter of 07/24/21 CT Cervical Spine Wo Contrast  Narrative CLINICAL DATA:  88 year old female status post fall. Vertigo.  EXAM: CT CERVICAL SPINE WITHOUT CONTRAST  TECHNIQUE: Multidetector CT imaging of the cervical spine was performed without intravenous contrast. Multiplanar CT image reconstructions were also generated.  RADIATION DOSE  REDUCTION: This exam was performed according to the departmental dose-optimization program which includes automated exposure control, adjustment of the mA and/or kV according to patient size and/or use of iterative reconstruction technique.  COMPARISON:  CT head and face today.  FINDINGS: Alignment: Preserved cervical lordosis. Cervicothoracic junction alignment is within normal limits. Bilateral posterior element alignment is within normal limits.  Skull base and vertebrae: Visualized skull base is intact. No atlanto-occipital dissociation. C1 and C2 appear intact and aligned. Some joint space loss, greater on the right. No acute osseous abnormality identified.  Soft tissues and spinal canal: No prevertebral fluid or swelling. No visible canal hematoma. Bulky cervical carotid calcified atherosclerosis. Partially retropharyngeal course of the CCA is. Otherwise negative visible noncontrast neck soft tissues.  Disc levels: C1-C2 degeneration. Widespread cervical facet arthropathy greater on the right. Evidence of C3-C4 ankylosis. Mild for age disc and endplate degeneration elsewhere. No cervical spinal stenosis suspected.  Upper chest: Prior sternotomy. Extensive calcified atherosclerosis of the proximal left subclavian artery. Negative lung apices. Visible upper thoracic levels appear grossly intact.  IMPRESSION: 1. No acute traumatic injury identified in the cervical spine. 2. Widespread cervical spine degeneration. C3-C4 ankylosis. No cervical spinal stenosis suspected. 3. Bulky calcified carotid and left subclavian artery atherosclerosis.   Electronically Signed By: Odessa Fleming M.D. On: 07/24/2021 08:53  Lumbosacral Imaging: Lumbar MR wo contrast: Results for orders placed during the hospital encounter of 04/06/17 MR LUMBAR SPINE WO CONTRAST  Narrative CLINICAL DATA:  Low back pain.  Right-sided sciatica  EXAM: MRI LUMBAR SPINE WITHOUT  CONTRAST  TECHNIQUE: Multiplanar, multisequence MR imaging of the lumbar spine was performed. No intravenous contrast was administered.  COMPARISON:  Lumbar MRI 11/03/2010  FINDINGS: Segmentation:  Normal  Alignment: Mild anterolisthesis L3-4 has developed since prior study. 3 mm anterolisthesis L5-S1 has developed since the prior study.  Vertebrae: Negative for fracture or mass. Hemangioma L3 vertebral body on the left.  Conus medullaris: Extends to the L1 level and appears normal.  Paraspinal and other soft tissues: Right renal cyst. Negative for mass or fluid collection.  Disc levels:  L1-2:  Negative  L2-3:  Negative  L3-4: Mild anterolisthesis. Moderate to advanced facet degeneration with significant progression from the prior study. Mild narrowing of the canal without significant spinal stenosis. Neural foramina patent.  L4-5: Moderate disc degeneration similar to the prior study. Disc bulging and diffuse endplate spurring. Mild to moderate subarticular foraminal stenosis on the left. Central canal mildly narrowed  L5-S1: 3 mm anterolisthesis. Moderate facet degeneration. Negative for stenosis.  IMPRESSION: Progressive facet degeneration L3-4 with mild anterolisthesis. Mild narrowing of the spinal canal without significant spinal stenosis  Moderate disc degeneration and spurring L4-5, unchanged from the prior study. Mild to moderate subarticular stenosis on the left  3 mm anterolisthesis L5-S1 which has developed since the prior study. No significant stenosis.   Electronically Signed By: Marlan Palau M.D. On: 04/06/2017 12:44  Hip Imaging: Hip-R MR wo contrast: Results for orders placed during the hospital encounter of 04/09/15 MR Hip Right Wo Contrast  Narrative CLINICAL DATA:  Acute right hip pain. The patient fell on 03/20/2015.  EXAM: MR OF THE RIGHT HIP WITHOUT CONTRAST  TECHNIQUE: Multiplanar, multisequence MR imaging was performed. No  intravenous contrast was administered.  COMPARISON:  Report of radiographs dated 03/22/2015  FINDINGS: Bones: There are fractures of the right inferior and superior pubic rami as well as an oblique fracture through the right pubic body. There is a subtle vertical fracture through the right sacral ala.  There is slight edema in the adductor muscles of the right hip due to the pubic rami fractures. The acetabulum and proximal right femur are normal. There is slight enthesophyte formation on the right greater trochanter but there is no fracture.  There are no hip joint effusions. Muscle structures are bilaterally symmetrical and normal. No bursitis. No soft tissue hematoma is or appreciable contusions.  No adenopathy or mass lesions.  The patient does have multilevel degenerative disc and joint disease in the lower lumbar spine.  IMPRESSION: Acute or subacute fractures of the right pubic rami and right pubic body and right sacral ala.  No fracture of the right hip.   Electronically Signed By: Francene Boyers M.D. On: 04/09/2015 09:25  Wrist Imaging: Wrist-R DG Complete: Results for orders placed during the hospital encounter of 07/24/21 DG Wrist Complete Right  Addendum 07/24/2021  3:11 PM ADDENDUM REPORT: 07/24/2021 15:08  ADDENDUM: Please see the addendum  CLINICAL DATA:  Pain status post fall.  EXAM: Left forearm-two views; right wrist complete 3+  views.  FINDINGS/IMPRESSION: RIGHT WRIST: There is a mildly displaced fracture of the distal ulna with approximately half shaft width ulnar displacement and minimal overlap of the fracture fragments.  Triscaphe joint osteoarthritis.  Left forearm.  No acute fracture or dislocation of the left forearm. Chronic healed fracture of the left distal radius.   Electronically Signed By: Larose Hires D.O. On: 07/24/2021 15:08  Addendum 07/24/2021 11:51 AM ADDENDUM REPORT: 07/24/2021 11:48  ADDENDUM: The wrist exam  was incorrectly labeled as left.  There is a displaced fracture of the right distal radius.  There is no appreciable fracture of the left radius.   Electronically Signed By: Larose Hires D.O. On: 07/24/2021 11:48  Narrative CLINICAL DATA:  Pain post fall.  EXAM: LEFT FOREARM - 2 VIEW; RIGHT WRIST - COMPLETE 3+ VIEW  COMPARISON:  None.  FINDINGS: Left wrist: Mildly displaced impaction fracture of the distal radius. First carpometacarpal and triscaphe joint arthropathy. Osteopenia. Soft tissue swelling about the wrist.  Left forearm. Distal radial fracture as described above. No other appreciable fracture or dislocation. Osteopenia.  IMPRESSION: Mildly displaced impaction fracture of the distal radius.  Osteoarthritis of the first carpometacarpal joint and triscaphe joints.  Electronically Signed: By: Larose Hires D.O. On: 07/24/2021 09:07  Wrist-L DG Complete: Results for orders placed during the hospital encounter of 05/07/16 DG Wrist Complete Left  Narrative CLINICAL DATA:  Fall, left wrist pain  EXAM: LEFT WRIST - COMPLETE 3+ VIEW  COMPARISON:  None.  FINDINGS: There is a fracture through the distal left radius. No significant displacement. No ulnar abnormality. No subluxation or dislocation.  IMPRESSION: Nondisplaced distal left radial fracture.   Electronically Signed By: Charlett Nose M.D. On: 05/07/2016 13:21  Complexity Note: Imaging results reviewed.                         ROS  Cardiovascular: {Hx; Cardiovascular History:210120525} Pulmonary or Respiratory: {Hx; Pumonary and/or Respiratory History:210120523} Neurological: {Hx; Neurological:210120504} Psychological-Psychiatric: {Hx; Psychological-Psychiatric History:210120512} Gastrointestinal: {Hx; Gastrointestinal:210120527} Genitourinary: {Hx; Genitourinary:210120506} Hematological: {Hx; Hematological:210120510} Endocrine: {Hx; Endocrine history:210120509} Rheumatologic: {Hx;  Rheumatological:210120530} Musculoskeletal: {Hx; Musculoskeletal:210120528} Work History: {Hx; Work history:210120514}  Allergies  Brandi [name] is allergic to accupril [quinapril hcl], micardis [telmisartan], and quinapril.  Laboratory Chemistry Profile   Renal Lab Results  Component Value Date   BUN 12 05/30/2023   CREATININE 0.97 05/30/2023   LABCREA 29 04/13/2020   GFRAA 54 (L) 05/24/2018   GFRNONAA 57 (L) 05/30/2023   SPECGRAV 1.010 05/04/2022   PHUR 6.5 05/04/2022   PROTEINUR NEGATIVE 05/11/2023     Electrolytes Lab Results  Component Value Date   NA 137 05/30/2023   K 4.2 05/30/2023   CL 103 05/30/2023   CALCIUM 8.8 (L) 05/30/2023   MG 1.9 05/13/2023   PHOS 3.8 02/06/2022     Hepatic Lab Results  Component Value Date   AST 21 02/22/2023   ALT 17 02/22/2023   ALBUMIN 3.6 02/22/2023   ALKPHOS 66 02/22/2023   LIPASE 25 02/22/2023     ID Lab Results  Component Value Date   SARSCOV2NAA NEGATIVE 10/13/2020   MRSAPCR NEGATIVE 04/14/2016     Bone No results found for: "VD25OH", "VD125OH2TOT", "WG9562ZH0", "QM5784ON6", "25OHVITD1", "25OHVITD2", "25OHVITD3", "TESTOFREE", "TESTOSTERONE"   Endocrine Lab Results  Component Value Date   GLUCOSE 179 (H) 05/30/2023   GLUCOSEU NEGATIVE 05/11/2023   HGBA1C 6.5 (H) 05/10/2023   TSH 2.348 05/10/2023   FREET4 0.81 02/03/2022     Neuropathy Lab  Results  Component Value Date   VITAMINB12 632 02/04/2022   FOLATE 26.0 02/04/2022   HGBA1C 6.5 (H) 05/10/2023     CNS No results found for: "COLORCSF", "APPEARCSF", "RBCCOUNTCSF", "WBCCSF", "POLYSCSF", "LYMPHSCSF", "EOSCSF", "PROTEINCSF", "GLUCCSF", "JCVIRUS", "CSFOLI", "IGGCSF", "LABACHR", "ACETBL"   Inflammation (CRP: Acute  ESR: Chronic) Lab Results  Component Value Date   LATICACIDVEN 1.8 10/24/2022     Rheumatology No results found for: "RF", "ANA", "LABURIC", "URICUR", "LYMEIGGIGMAB", "LYMEABIGMQN", "HLAB27"   Coagulation Lab Results  Component Value Date    INR 0.9 04/14/2020   LABPROT 12.1 04/14/2020   PLT 183 05/30/2023   DDIMER 0.55 (H) 02/03/2022     Cardiovascular Lab Results  Component Value Date   BNP 100.0 04/14/2016   CKTOTAL 54 06/30/2011   CKMB < 0.5 (L) 06/30/2011   TROPONINI 0.24 (HH) 05/21/2018   HGB 12.8 05/30/2023   HCT 40.0 05/30/2023     Screening Lab Results  Component Value Date   SARSCOV2NAA NEGATIVE 10/13/2020   MRSAPCR NEGATIVE 04/14/2016     Cancer No results found for: "CEA", "CA125", "LABCA2"   Allergens No results found for: "ALMOND", "APPLE", "ASPARAGUS", "AVOCADO", "BANANA", "BARLEY", "BASIL", "BAYLEAF", "GREENBEAN", "LIMABEAN", "WHITEBEAN", "BEEFIGE", "REDBEET", "BLUEBERRY", "BROCCOLI", "CABBAGE", "MELON", "CARROT", "CASEIN", "CASHEWNUT", "CAULIFLOWER", "CELERY"     Note: Lab results reviewed.  PFSH  Drug: Brandi [name]  reports no history of drug use. Alcohol:  reports no history of alcohol use. Tobacco:  reports that she has never smoked. She has never used smokeless tobacco. Medical:  has a past medical history of Acquired cyst of kidney, Anemia, Anxiety, Aortic atherosclerosis (HCC), Arthritis, degenerative, Atrial fibrillation and flutter (HCC), Baden-Walker grade 3 cystocele, Chronic cystitis, Coronary artery disease (08/05/2010), Depression, Diabetic neuropathy (HCC), Dyspnea, GERD (gastroesophageal reflux disease), Headache, History of left atrial appendage closure (05/29/2018), HLD (hyperlipidemia), HTN (hypertension), Implantable loop recorder present (09/20/2016), Insomnia, Murmur, NSTEMI (non-ST elevated myocardial infarction) (HCC) (05/20/2018), NSVT (nonsustained ventricular tachycardia) (HCC) (01/2022), Obesity, OSA on CPAP, PONV (postoperative nausea and vomiting), Presence of drug coated stent in LAD coronary artery, PSVT (paroxysmal supraventricular tachycardia) (HCC) (01/2022), S/P CABG x 3 (05/29/2018), Sepsis (HCC), Spinal stenosis, Syncopal episodes (10/2022), Syncope, T2DM (type 2  diabetes mellitus) (HCC), Unstable angina (HCC), and Urge incontinence of urine. Family: family history includes Bladder Cancer in her maternal aunt; Breast cancer in her cousin; COPD in an other family member; Diabetes in an other family member; Hypercholesterolemia in an other family member; Hypertension in an other family member; Skin cancer in an other family member.  Past Surgical History:  Procedure Laterality Date   BREAST BIOPSY Right 2004   neg   BUNIONECTOMY     CHOLECYSTECTOMY     COLONOSCOPY WITH PROPOFOL N/A 06/14/2017   Procedure: COLONOSCOPY WITH PROPOFOL;  Surgeon: Christena Deem, MD;  Location: [facility] ENDOSCOPY;  Service: Endoscopy;  Laterality: N/A;   CORONARY ANGIOPLASTY WITH STENT PLACEMENT Left 08/05/2010   Procedure: CORONARY ANGIOPLASTY WITH STENT PLACEMENT; Location: [facility]; Surgeon(s): Harold Hedge, MD (diagnostic) and Rudean Hitt, MD (interventional)   CORONARY ARTERY BYPASS GRAFT N/A 05/29/2018   Procedure: CORONARY ARTERY BYPASS GRAFT; Location: [facility]; Surgeon: Flint Melter, MD   HYSTEROSCOPY WITH D & C N/A 12/14/2022   Procedure: FRACTIONAL DILATATION AND CURETTAGE /HYSTEROSCOPY;  Surgeon: Suzy Bouchard, MD;  Location: [facility] ORS;  Service: Gynecology;  Laterality: N/A;   LEFT ATRIAL APPENDAGE OCCLUSION Left 05/29/2018   Procedure: LEFT ATRIAL APPENDAGE CLOSURE; Location: [facility]; Surgeon: Flint Melter, MD   LEFT HEART CATH AND CORONARY ANGIOGRAPHY  N/A 05/22/2018   Procedure: LEFT HEART CATH AND CORONARY ANGIOGRAPHY;  Surgeon: Lamar Blinks, MD;  Location: [facility] INVASIVE CV LAB;  Service: Cardiovascular;  Laterality: N/A;   LEFT HEART CATH AND CORONARY ANGIOGRAPHY Left 07/03/2011   Procedure: LEFT HEART CATH AND CORONARY ANGIOGRAPHY; Location: [facility]; Surgeon: Harold Hedge, MD   LEFT HEART CATH AND CORONARY ANGIOGRAPHY Left 05/22/2018   Procedure: LEFT HEART CATH AND CORONARY ANGIOGRAPHY; Location: [facility]; Surgeon: Harold Hedge, MD   LOOP RECORDER INSERTION  N/A 09/20/2016   Procedure: Loop Recorder Insertion;  Surgeon: Marcina Millard, MD;  Location: [facility] INVASIVE CV LAB;  Service: Cardiovascular;  Laterality: N/A;   SEPTOPLASTY     TOTAL KNEE ARTHROPLASTY Bilateral    XI ROBOTIC ASSISTED INGUINAL HERNIA REPAIR WITH MESH Left 10/14/2020   Procedure: XI ROBOTIC ASSISTED INGUINAL HERNIA REPAIR WITH MESH;  Surgeon: Campbell Lerner, MD;  Location: [facility] ORS;  Service: General;  Laterality: Left;   Active Ambulatory Problems    Diagnosis Date Noted   Urinary tract infection without hematuria 09/13/2015   Cystocele, grade 3 09/13/2015   Incontinence 09/13/2015   Severe sepsis (HCC) 04/14/2016   Acquired cyst of kidney 05/02/2013   Action tremor 09/11/2016   Benign essential hypertension 12/01/2013   Chronic cystitis 03/11/2012   Headache disorder 09/11/2016   Coronary arteriosclerosis 01/06/2014   Depression, major, in remission (HCC) 12/23/2014   Diabetic neuropathy (HCC) 10/06/2015   Obesity, unspecified 12/01/2013   Orthostatic hypotension 11/10/2015   Pernicious anemia 12/23/2014   Positional lightheadedness 03/14/2017   OSA on CPAP 01/06/2014   Diabetes mellitus type 2, insulin dependent (HCC) 03/14/2017   Chest pain 05/20/2018   Unstable angina (HCC) 05/21/2018   Recurrent syncope 03/28/2020   CAD s/p CABG (coronary artery disease) 03/28/2020   AKI (acute kidney injury) (HCC) 04/13/2020   Acute hyponatremia 04/13/2020   Dehydration 04/13/2020   Diarrhea 04/13/2020   Abdominal pain 04/13/2020   Anxiety 05/25/2018   Atrial fibrillation and flutter (HCC) 05/25/2018   Background diabetic retinopathy associated with type 2 diabetes mellitus (HCC) 12/06/2018   Chronic kidney disease, stage III (moderate) (HCC) 02/12/2019   Decreased activities of daily living (ADL) 06/05/2018   Decreased strength, endurance, and mobility 06/05/2018   H/O gastroesophageal reflux (GERD) 05/25/2018   Hyperlipidemia 05/25/2018   Presence of drug  coated stent in LAD coronary artery 05/27/2018   S/P CABG x 3 06/04/2018   Sacroiliitis (HCC) 02/21/2018   Urge incontinence 03/11/2012   Status post laparoscopic hernia repair 10/28/2020   Obesity (BMI 30-39.9) 02/03/2022   Anemia of chronic disease 02/05/2022   HTN (hypertension), malignant    Hypertensive emergency 05/10/2023   Peripheral vertigo 05/10/2023   Acute sinusitis 05/10/2023   A-fib s/p ligation of left atrial appendage 05/10/2023   Obesity, Class III, BMI 40-49.9 (morbid obesity) (HCC) 05/10/2023   Chronic back pain 05/10/2023   Thickened endometrium 09/13/2022   Resolved Ambulatory Problems    Diagnosis Date Noted   Incarcerated left inguinal hernia 10/14/2020   Altered mental status, unspecified 02/04/2022   Past Medical History:  Diagnosis Date   Anemia    Aortic atherosclerosis (HCC)    Arthritis, degenerative    Baden-Walker grade 3 cystocele    Coronary artery disease 08/05/2010   Depression    Dyspnea    GERD (gastroesophageal reflux disease)    Headache    History of left atrial appendage closure 05/29/2018   HLD (hyperlipidemia)    HTN (hypertension)    Implantable loop recorder  present 09/20/2016   Insomnia    Murmur    NSTEMI (non-ST elevated myocardial infarction) (HCC) 05/20/2018   NSVT (nonsustained ventricular tachycardia) (HCC) 01/2022   Obesity    PONV (postoperative nausea and vomiting)    PSVT (paroxysmal supraventricular tachycardia) (HCC) 01/2022   Sepsis (HCC)    Spinal stenosis    Syncopal episodes 10/2022   Syncope    T2DM (type 2 diabetes mellitus) (HCC)    Urge incontinence of urine    Constitutional Exam  General appearance: Well nourished, well developed, and well hydrated. In no apparent acute distress There were no vitals filed for this visit. BMI Assessment: Estimated body mass index is 36.84 kg/m as calculated from the following:   Height as of 05/30/23: 5\' 1"  (1.549 m).   Weight as of 05/30/23: 195 lb (88.5  kg).  BMI interpretation table: BMI level Category Range association with higher incidence of chronic pain  <18 kg/m2 Underweight   18.5-24.9 kg/m2 Ideal body weight   25-29.9 kg/m2 Overweight Increased incidence by 20%  30-34.9 kg/m2 Obese (Class I) Increased incidence by 68%  35-39.9 kg/m2 Severe obesity (Class II) Increased incidence by 136%  >40 kg/m2 Extreme obesity (Class III) Increased incidence by 254%   Patient's current BMI Ideal Body weight  There is no height or weight on file to calculate BMI. Patient weight not recorded   BMI Readings from Last 4 Encounters:  05/30/23 36.84 kg/m  05/10/23 35.90 kg/m  03/04/23 37.22 kg/m  02/22/23 37.49 kg/m   Wt Readings from Last 4 Encounters:  05/30/23 195 lb (88.5 kg)  05/10/23 190 lb (86.2 kg)  03/04/23 197 lb (89.4 kg)  02/22/23 198 lb 6.6 oz (90 kg)    Psych/Mental status: Alert, oriented x 3 (person, place, & time)       Eyes: PERLA Respiratory: No evidence of acute respiratory distress  Assessment  Primary Diagnosis & Pertinent Problem List: There were no encounter diagnoses.  Visit Diagnosis (New problems to examiner): No diagnosis found. Plan of Care (Initial workup plan)  Note: Brandi [name] was reminded that as per protocol, today's visit has been an evaluation only. We have not taken over the patient's controlled substance management.  Problem-specific plan: Assessment and Plan            Lab Orders  No laboratory test(s) ordered today   Imaging Orders  No imaging studies ordered today   Referral Orders  No referral(s) requested today   Procedure Orders    No procedure(s) ordered today   Pharmacotherapy (current): Medications ordered:  No orders of the defined types were placed in this encounter.  Medications administered during this visit: [name] had no medications administered during this visit.   Analgesic Pharmacotherapy:  Opioid Analgesics: For patients currently taking or  requesting to take opioid analgesics, in accordance with [facility] Guidelines, we will assess their risks and indications for the use of these substances. After completing our evaluation, we may offer recommendations, but we no longer take patients for medication management. The prescribing physician will ultimately decide, based on his/her training and level of comfort whether to adopt any of the recommendations, including whether or not to prescribe such medicines.  Membrane stabilizer: To be determined at a later time  Muscle relaxant: To be determined at a later time  NSAID: To be determined at a later time  Other analgesic(s): To be determined at a later time   Interventional management options: Brandi [name] was informed  that there is no guarantee that she would be a candidate for interventional therapies. The decision will be based on the results of diagnostic studies, as well as Brandi [name]'s risk profile.  Procedure(s) under consideration:  Pending results of ordered studies     Interventional Therapies  Risk Factors  Considerations  Medical Comorbidities:     Planned  Pending:      Under consideration:   Pending   Completed:   None at this time   Therapeutic  Palliative (PRN) options:   None established   Completed by other providers:   None reported     Provider-requested follow-up: No follow-ups on file.  Future Appointments  Date Time Provider Department Center  08/15/2023 10:00 AM Delano Metz, MD [facility] None    Duration of encounter: *** minutes.  Total time on encounter, as per AMA guidelines included both the face-to-face and non-face-to-face time personally spent by the physician and/or other qualified health care professional(s) on the day of the encounter (includes time in activities that require the physician or other qualified health care professional and does not include time in activities normally performed by clinical staff).  Physician's time may include the following activities when performed: Preparing to see the patient (e.g., pre-charting review of records, searching for previously ordered imaging, lab work, and nerve conduction tests) Review of prior analgesic pharmacotherapies. Reviewing PMP Interpreting ordered tests (e.g., lab work, imaging, nerve conduction tests) Performing post-procedure evaluations, including interpretation of diagnostic procedures Obtaining and/or reviewing separately obtained history Performing a medically appropriate examination and/or evaluation Counseling and educating the patient/family/caregiver Ordering medications, tests, or procedures Referring and communicating with other health care professionals (when not separately reported) Documenting clinical information in the electronic or other health record Independently interpreting results (not separately reported) and communicating results to the patient/ family/caregiver Care coordination (not separately reported)  Note by: Oswaldo Done, MD (AI and TTS technology used. I apologize for any typographical errors that were not detected and corrected.) Date: 08/15/2023; Time: 7:58 AM

## 2023-08-15 ENCOUNTER — Ambulatory Visit
Admission: RE | Admit: 2023-08-15 | Discharge: 2023-08-15 | Disposition: A | Discharge status: Home / Self Care | Payer: Medicare PPO | Source: Ambulatory Visit | Attending: Pain Medicine | Admitting: Pain Medicine

## 2023-08-15 ENCOUNTER — Encounter: Payer: Self-pay | Admitting: Pain Medicine

## 2023-08-15 ENCOUNTER — Ambulatory Visit: Payer: Medicare PPO | Admitting: Pain Medicine

## 2023-08-15 VITALS — BP 139/63 | HR 75 | Temp 97.8°F | Resp 18 | Ht 61.0 in | Wt 192.6 lb

## 2023-08-15 DIAGNOSIS — M9904 Segmental and somatic dysfunction of sacral region: Secondary | ICD-10-CM | POA: Insufficient documentation

## 2023-08-15 DIAGNOSIS — M545 Low back pain, unspecified: Secondary | ICD-10-CM | POA: Diagnosis present

## 2023-08-15 DIAGNOSIS — M4317 Spondylolisthesis, lumbosacral region: Secondary | ICD-10-CM

## 2023-08-15 DIAGNOSIS — R937 Abnormal findings on diagnostic imaging of other parts of musculoskeletal system: Secondary | ICD-10-CM | POA: Insufficient documentation

## 2023-08-15 DIAGNOSIS — G8929 Other chronic pain: Secondary | ICD-10-CM | POA: Insufficient documentation

## 2023-08-15 DIAGNOSIS — M461 Sacroiliitis, not elsewhere classified: Secondary | ICD-10-CM

## 2023-08-15 DIAGNOSIS — M79651 Pain in right thigh: Secondary | ICD-10-CM | POA: Insufficient documentation

## 2023-08-15 DIAGNOSIS — Z79899 Other long term (current) drug therapy: Secondary | ICD-10-CM | POA: Insufficient documentation

## 2023-08-15 DIAGNOSIS — Z789 Other specified health status: Secondary | ICD-10-CM | POA: Insufficient documentation

## 2023-08-15 DIAGNOSIS — R7309 Other abnormal glucose: Secondary | ICD-10-CM | POA: Insufficient documentation

## 2023-08-15 DIAGNOSIS — M533 Sacrococcygeal disorders, not elsewhere classified: Secondary | ICD-10-CM | POA: Insufficient documentation

## 2023-08-15 DIAGNOSIS — M899 Disorder of bone, unspecified: Secondary | ICD-10-CM | POA: Diagnosis not present

## 2023-08-15 DIAGNOSIS — M5459 Other low back pain: Secondary | ICD-10-CM | POA: Insufficient documentation

## 2023-08-15 DIAGNOSIS — R944 Abnormal results of kidney function studies: Secondary | ICD-10-CM

## 2023-08-15 DIAGNOSIS — M79604 Pain in right leg: Secondary | ICD-10-CM | POA: Insufficient documentation

## 2023-08-15 DIAGNOSIS — G894 Chronic pain syndrome: Secondary | ICD-10-CM | POA: Diagnosis not present

## 2023-08-15 NOTE — Progress Notes (Signed)
 Safety precautions to be maintained throughout the outpatient stay will include: orient to surroundings, keep bed in low position, maintain call bell within reach at all times, provide assistance with transfer out of bed and ambulation.

## 2023-08-15 NOTE — Patient Instructions (Addendum)
 Procedure instructions  Do not eat or drink fluids (other than water) for 6 hours before your procedure  No water for 2 hours before your procedure  Take your blood pressure medicine with a sip of water  Arrive 30 minutes before your appointment  Carefully read the "Preparing for your procedure" detailed instructions  If you have questions call us at [phone]  _____________________________________________________________________    ______________________________________________________________________  Preparing for your procedure  Appointments: If you think you may not be able to keep your appointment, call 24-48 hours in advance to cancel. We need time to make it available to others.  During your procedure appointment there will be: No Prescription Refills. No disability issues to discussed. No medication changes or discussions.  Instructions: Food intake: Avoid eating anything solid for at least 8 hours prior to your procedure. Clear liquid intake: You may take clear liquids such as water up to 2 hours prior to your procedure. (No carbonated drinks. No soda.) Transportation: Unless otherwise stated by your physician, bring a driver. Morning Medicines: Except for blood thinners, take all of your other morning medications with a sip of water. Make sure to take your heart and blood pressure medicines. If your blood pressure's lower number is above 100, the case will be rescheduled. Blood thinners: Make sure to stop your blood thinners as instructed.  If you take a blood thinner, but were not instructed to stop it, call our office [phone] and ask to talk to a nurse. Not stopping a blood thinner prior to certain procedures could lead to serious complications. Diabetics on insulin: Notify the staff so that you can be scheduled 1st case in the morning. If your diabetes requires high dose insulin, take only  of your normal insulin dose the morning of the procedure and  notify the staff that you have done so. Preventing infections: Shower with an antibacterial soap the morning of your procedure.  Build-up your immune system: Take 1000 mg of Vitamin C with every meal (3 times a day) the day prior to your procedure. Antibiotics: Inform the nursing staff if you are taking any antibiotics or if you have any conditions that may require antibiotics prior to procedures. (Example: recent joint implants)   Pregnancy: If you are pregnant make sure to notify the nursing staff. Not doing so may result in injury to the fetus, including death.  Sickness: If you have a cold, fever, or any active infections, call and cancel or reschedule your procedure. Receiving steroids while having an infection may result in complications. Arrival: You must be in the facility at least 30 minutes prior to your scheduled procedure. Tardiness: Your scheduled time is also the cutoff time. If you do not arrive at least 15 minutes prior to your procedure, you will be rescheduled.  Children: Do not bring any children with you. Make arrangements to keep them home. Dress appropriately: There is always a possibility that your clothing may get soiled. Avoid long dresses. Valuables: Do not bring any jewelry or valuables.  Reasons to call and reschedule or cancel your procedure: (Following these recommendations will minimize the risk of a serious complication.) Surgeries: Avoid having procedures within 2 weeks of any surgery. (Avoid for 2 weeks before or after any surgery). Flu Shots: Avoid having procedures within 2 weeks of a flu shots or . (Avoid for 2 weeks before or after immunizations). Barium: Avoid having a procedure within 7-10 days after having had a radiological study involving the use of radiological contrast. (  Myelograms, Barium swallow or enema study). Heart attacks: Avoid any elective procedures or surgeries for the initial 6 months after a "Myocardial Infarction" (Heart Attack). Blood  thinners: It is imperative that you stop these medications before procedures. Let us know if you if you take any blood thinner.  Infection: Avoid procedures during or within two weeks of an infection (including chest colds or gastrointestinal problems). Symptoms associated with infections include: Localized redness, fever, chills, night sweats or profuse sweating, burning sensation when voiding, cough, congestion, stuffiness, runny nose, sore throat, diarrhea, nausea, vomiting, cold or Flu symptoms, recent or current infections. It is specially important if the infection is over the area that we intend to treat. Heart and lung problems: Symptoms that may suggest an active cardiopulmonary problem include: cough, chest pain, breathing difficulties or shortness of breath, dizziness, ankle swelling, uncontrolled high or unusually low blood pressure, and/or palpitations. If you are experiencing any of these symptoms, cancel your procedure and contact your primary care physician for an evaluation.  Remember:  Regular Business hours are:  Monday to Thursday 8:00 AM to 4:00 PM  Provider's Schedule: Delano Metz, MD:  Procedure days: Tuesday and Thursday 7:30 AM to 4:00 PM  Edward Jolly, MD:  Procedure days: Monday and Wednesday 7:30 AM to 4:00 PM ______________________________________________________________________    New Patients  Welcome to [place] [facility].   Initial Visit The first or initial visit consists of an evaluation only.   Interventional pain management.  We offer therapies other than opioid controlled substances to manage chronic pain. These include, but are not limited to, diagnostic, therapeutic, and palliative specialized injection therapies (i.e.: Epidural Steroids, Facet Blocks, etc.). We specialize in a variety of nerve blocks as well as radiofrequency treatments. We offer pain implant evaluations and trials, as well  as follow up management. In addition we also provide a variety joint injections, including Viscosupplementation (AKA: Gel Therapy).  Prescription Pain Medication. We specialize in alternatives to opioids. We can provide evaluations and recommendations for/of pharmacologic therapies based on CDC Guidelines.  We no longer take patients for long-term medication management. We will not be taking over your pain medications.  ______________________________________________________________________      ______________________________________________________________________    Patient Information update  To: All of our patients.  Re: Name change.  It has been made official that our current name, "[facility]"   will soon be changed to "[place] [facility]".   The purpose of this change is to eliminate any confusion created by the concept of our practice being a "Medication Management Pain Clinic". In the past this has led to the misconception that we treat pain primarily by the use of prescription medications.  Nothing can be farther from the truth.   Understanding PAIN MANAGEMENT: To further understand what our practice does, you first have to understand that "Pain Management" is a subspecialty that requires additional training once a physician has completed their specialty training, which can be in either Anesthesia, Neurology, Psychiatry, or Physical Medicine and Rehabilitation (PMR). Each one of these contributes to the final approach taken by each physician to the management of their patient's pain. To be a "Pain Management Specialist" you must have first completed one of the specialty trainings below.  Anesthesiologists - trained in clinical pharmacology and interventional techniques such as nerve blockade and regional as well as central neuroanatomy. They are trained to block pain before, during,  and after surgical interventions.  Neurologists - trained in the diagnosis and pharmacological treatment of complex neurological conditions, such as Multiple Sclerosis, Parkinson's, spinal cord injuries, and other systemic conditions that may be associated with symptoms that may include but are not limited to pain. They tend to rely primarily on the treatment of chronic pain using prescription medications.  Psychiatrist - trained in conditions affecting the psychosocial wellbeing of patients including but not limited to depression, anxiety, schizophrenia, personality disorders, addiction, and other substance use disorders that may be associated with chronic pain. They tend to rely primarily on the treatment of chronic pain using prescription medications.   Physical Medicine and Rehabilitation (PMR) physicians, also known as physiatrists - trained to treat a wide variety of medical conditions affecting the brain, spinal cord, nerves, bones, joints, ligaments, muscles, and tendons. Their training is primarily aimed at treating patients that have suffered injuries that have caused severe physical impairment. Their training is primarily aimed at the physical therapy and rehabilitation of those patients. They may also work alongside orthopedic surgeons or neurosurgeons using their expertise in assisting surgical patients to recover after their surgeries.  INTERVENTIONAL PAIN MANAGEMENT is sub-subspecialty of Pain Management.  Our physicians are Board-certified in Anesthesia, Pain Management, and Interventional Pain Management.  This meaning that not only have they been trained and Board-certified in their specialty of Anesthesia, and subspecialty of Pain Management, but they have also received further training in the sub-subspecialty of Interventional Pain Management, in order to become Board-certified as INTERVENTIONAL PAIN MANAGEMENT SPECIALIST.    Mission: Our goal is to use our skills in  INTERVENTIONAL  PAIN MANAGEMENT as alternatives to the chronic use of prescription opioid medications for the treatment of pain. To make this more clear, we have changed our name to reflect what we do and offer. We will continue to offer medication management assessment and recommendations, but we will not be taking over any patient's medication management.  ______________________________________________________________________

## 2023-08-20 NOTE — Progress Notes (Unsigned)
 PROVIDER NOTE: Interpretation of information contained herein should be left to medically-trained personnel. Specific patient instructions are provided elsewhere under "Patient Instructions" section of medical record. This document was created in part using STT-dictation technology, any transcriptional errors that may result from this process are unintentional.  Patient: Brandi [name] Type: Established DOB: July 24, 1935 MRN: [id number] PCP: Gracelyn Nurse, MD  Service: Procedure DOS: 08/21/2023 Setting: Ambulatory Location: Ambulatory outpatient facility Delivery: Face-to-face Provider: Oswaldo Done, MD Specialty: Interventional Pain Management Specialty designation: 09 Location: Outpatient facility Ref. Prov.: Gracelyn Nurse, MD       Interventional Therapy   Procedure: Sacroiliac Joint Steroid Injection #1    Laterality: Right     Level: PSIS (Posterior Superior Iliac Spine)  Imaging: Fluoroscopic guidance Anesthesia: Local anesthesia (1-2% Lidocaine) Anxiolysis: IV Versed         Sedation: No Sedation                       DOS: 08/21/2023  Performed by: Oswaldo Done, MD  Purpose: Diagnostic/Therapeutic Indications: Sacroiliac joint pain in the lower back and hip area severe enough to impact quality of life or function. Rationale (medical necessity): procedure needed and proper for the diagnosis and/or treatment of Brandi [name]'s medical symptoms and needs. 1. Chronic low back pain (1ry area of Pain) (Right) w/o sciatica   2. Chronic sacroiliac joint pain (Right)   3. Low back pain radiating to leg (Right)   4. Sacroiliitis (HCC)   5. Somatic dysfunction of sacroiliac joint (Right)   6. Abnormal MRI, lumbar spine (04/06/2017)    NAS-11 Pain score:   Pre-procedure: 3 /10   Post-procedure: 3 /10     Target: Interarticular sacroiliac joint. Location: Medial to the postero-medial edge of iliac spine. Region: Lumbosacral-sacrococcygeal. Approach: Superior & inferior  postero-medial percutaneous approach. Type of procedure: Percutaneous joint injection.  Position / Prep / Materials:  Position: Prone  Prep solution: ChloraPrep (2% chlorhexidine gluconate and 70% isopropyl alcohol) Prep Area: Entire posterior lumbosacral area  Materials:  Tray: Block Needle(s):  Type: Spinal  Gauge (G): 22  Length: 5-in Qty: 1  H&P (Pre-op Assessment):  [name] is a 88 y.o. (year old), female patient, seen today for interventional treatment. She  has a past surgical history that includes Cholecystectomy; Total knee arthroplasty (Bilateral); Septoplasty; Bunionectomy; LOOP RECORDER INSERTION (N/A, 09/20/2016); Colonoscopy with propofol (N/A, 06/14/2017); Breast biopsy (Right, 2004); LEFT HEART CATH AND CORONARY ANGIOGRAPHY (N/A, 05/22/2018); XI Robotic assisted inguinal hernia repair with mesh (Left, 10/14/2020); Coronary artery bypass graft (N/A, 05/29/2018); LEFT ATRIAL APPENDAGE OCCLUSION (Left, 05/29/2018); Coronary angioplasty with stent (Left, 08/05/2010); LEFT HEART CATH AND CORONARY ANGIOGRAPHY (Left, 07/03/2011); LEFT HEART CATH AND CORONARY ANGIOGRAPHY (Left, 05/22/2018); and Hysteroscopy with D & C (N/A, 12/14/2022). Brandi [name] has a current medication list which includes the following prescription(s): acetaminophen, aspirin ec, atorvastatin, budesonide-formoterol, calcium carb-cholecalciferol, carboxymethylcellulose, carvedilol, cyanocobalamin, famotidine, ferrous sulfate, gabapentin, glipizide, hydralazine, lantus solostar, janumet xr, loratadine, losartan, meclizine, melatonin, mirabegron er, preservision/lutein, nitroglycerin, progesterone, sodium chloride, and verapamil hcl cr, and the following Facility-Administered Medications: lidocaine, methylprednisolone acetate, midazolam, pentafluoroprop-tetrafluoroeth, and ropivacaine (pf) 2 mg/ml (0.2%). Her primarily concern today is the Back Pain (right)  Initial Vital Signs:  Pulse/HCG Rate: 73ECG Heart Rate: 81 Temp:  (!) 97.3 F (36.3 C) Resp: 18 BP: (!) 180/74 SpO2: 97 %  BMI: Estimated body mass index is 36.66 kg/m as calculated from the following:   Height as of this encounter: 5\' 1"  (1.549  m).   Weight as of this encounter: 194 lb (88 kg).  Risk Assessment: Allergies: Reviewed. She is allergic to accupril [quinapril hcl], micardis [telmisartan], and quinapril.  Allergy Precautions: None required Coagulopathies: Reviewed. None identified.  Blood-thinner therapy: None at this time Active Infection(s): Reviewed. None identified. [name] is afebrile  Site Confirmation: Brandi [name] was asked to confirm the procedure and laterality before marking the site Procedure checklist: Completed Consent: Before the procedure and under the influence of no sedative(s), amnesic(s), or anxiolytics, the patient was informed of the treatment options, risks and possible complications. To fulfill our ethical and legal obligations, as recommended by the American Medical Association's Code of Ethics, I have informed the patient of my clinical impression; the nature and purpose of the treatment or procedure; the risks, benefits, and possible complications of the intervention; the alternatives, including doing nothing; the risk(s) and benefit(s) of the alternative treatment(s) or procedure(s); and the risk(s) and benefit(s) of doing nothing. The patient was provided information about the general risks and possible complications associated with the procedure. These may include, but are not limited to: failure to achieve desired goals, infection, bleeding, organ or nerve damage, allergic reactions, paralysis, and death. In addition, the patient was informed of those risks and complications associated to the procedure, such as failure to decrease pain; infection; bleeding; organ or nerve damage with subsequent damage to sensory, motor, and/or autonomic systems, resulting in permanent pain, numbness, and/or weakness of one or several  areas of the body; allergic reactions; (i.e.: anaphylactic reaction); and/or death. Furthermore, the patient was informed of those risks and complications associated with the medications. These include, but are not limited to: allergic reactions (i.e.: anaphylactic or anaphylactoid reaction(s)); adrenal axis suppression; blood sugar elevation that in diabetics may result in ketoacidosis or comma; water retention that in patients with history of congestive heart failure may result in shortness of breath, pulmonary edema, and decompensation with resultant heart failure; weight gain; swelling or edema; medication-induced neural toxicity; particulate matter embolism and blood vessel occlusion with resultant organ, and/or nervous system infarction; and/or aseptic necrosis of one or more joints. Finally, the patient was informed that Medicine is not an exact science; therefore, there is also the possibility of unforeseen or unpredictable risks and/or possible complications that may result in a catastrophic outcome. The patient indicated having understood very clearly. We have given the patient no guarantees and we have made no promises. Enough time was given to the patient to ask questions, all of which were answered to the patient's satisfaction. Ms. Strahm has indicated that she wanted to continue with the procedure. Attestation: I, the ordering provider, attest that I have discussed with the patient the benefits, risks, side-effects, alternatives, likelihood of achieving goals, and potential problems during recovery for the procedure that I have provided informed consent. Date  Time: 08/21/2023 10:52 AM  Pre-Procedure Preparation:  Monitoring: As per clinic protocol. Respiration, ETCO2, SpO2, BP, heart rate and rhythm monitor placed and checked for adequate function Safety Precautions: Patient was assessed for positional comfort and pressure points before starting the procedure. Time-out: I initiated and  conducted the "Time-out" before starting the procedure, as per protocol. The patient was asked to participate by confirming the accuracy of the "Time Out" information. Verification of the correct person, site, and procedure were performed and confirmed by me, the nursing staff, and the patient. "Time-out" conducted as per Joint Commission's Universal Protocol (UP.01.01.01). Time: 1208 Start Time: 1208 hrs.  Description/Narrative of Procedure:  Start Time: 1208 hrs.  Rationale (medical necessity): procedure needed and proper for the diagnosis and/or treatment of the patient's medical symptoms and needs. Procedural Technique Safety Precautions: Aspiration looking for blood return was conducted prior to all injections. At no point did we inject any substances, as a needle was being advanced. No attempts were made at seeking any paresthesias. Safe injection practices and needle disposal techniques used. Medications properly checked for expiration dates. SDV (single dose vial) medications used. Description of the Procedure: Protocol guidelines were followed. The patient was assisted into a comfortable position. The target area was identified and the area prepped in the usual manner. Skin & deeper tissues infiltrated with local anesthetic. Appropriate amount of time allowed to pass for local anesthetics to take effect. The procedure needles were then advanced to the target area. Proper needle placement secured. Negative aspiration confirmed. Solution injected in intermittent fashion, asking for systemic symptoms every 0.5cc of injectate. The needles were then removed and the area cleansed, making sure to leave some of the prepping solution back to take advantage of its long term bactericidal properties.  Technical description of procedure:  Fluoroscopy using a posterior anterior 45 degree angle from the midline aiming at the anterolateral aspect of the patient was used to find a direct path into the  sacroiliac joint, the superior medial to posterior superior iliac spine.  The skin was marked where the desired target and the skin infiltrated with local anesthetics.  The procedure needle was then advanced until the joint was entered.  Once inside of the joint, we then proceeded to inject the desired solution.  Vitals:   08/21/23 1052 08/21/23 1205 08/21/23 1210 08/21/23 1215  BP: (!) 180/74 (!) 223/95 (!) 215/84 (!) 210/89  Pulse: 73     Resp: 18 12 13 18   Temp: (!) 97.3 F (36.3 C)     SpO2: 97% 99% 99% 99%  Weight: 194 lb (88 kg)     Height: 5\' 1"  (1.549 m)        End Time: 1213 hrs.  Imaging Guidance (Non-Spinal):          Type of Imaging Technique: Fluoroscopy Guidance (Non-Spinal) Indication(s): Fluoroscopy guidance for needle placement to enhance accuracy in procedures requiring precise needle localization for targeted delivery of medication in or near specific anatomical locations not easily accessible without such real-time imaging assistance. Exposure Time: Please see nurses notes. Contrast: None used. Fluoroscopic Guidance: I was personally present during the use of fluoroscopy. "Tunnel Vision Technique" used to obtain the best possible view of the target area. Parallax error corrected before commencing the procedure. "Direction-depth-direction" technique used to introduce the needle under continuous pulsed fluoroscopy. Once target was reached, antero-posterior, oblique, and lateral fluoroscopic projection used confirm needle placement in all planes. Images permanently stored in EMR. Interpretation: No contrast injected. I personally interpreted the imaging intraoperatively. Adequate needle placement confirmed in multiple planes. Permanent images saved into the patient's record.  Post-operative Assessment:  Post-procedure Vital Signs:  Pulse/HCG Rate: 7386 Temp: (!) 97.3 F (36.3 C) Resp: 18 BP: (!) 210/89 SpO2: 99 %  EBL: None  Complications: No immediate  post-treatment complications observed by team, or reported by patient.  Note: The patient tolerated the entire procedure well. A repeat set of vitals were taken after the procedure and the patient was kept under observation following institutional policy, for this type of procedure. Post-procedural neurological assessment was performed, showing return to baseline, prior to discharge. The patient was provided with post-procedure discharge instructions,  including a section on how to identify potential problems. Should any problems arise concerning this procedure, the patient was given instructions to immediately contact us, at any time, without hesitation. In any case, we plan to contact the patient by telephone for a follow-up status report regarding this interventional procedure.  Comments:  No additional relevant information.  Plan of Care (POC)  Orders:  Orders Placed This Encounter  Procedures   SACROILIAC JOINT INJECTION    Scheduling Instructions:     Side: Right-sided     Sedation: No Sedation.     Timeframe: Today    Where will this procedure be performed?:   [facility]   [facility] C-ARM 1-60 MIN NO REPORT    Intraoperative interpretation by procedural physician at [facility].    Standing Status:   Standing    Number of Occurrences:   1    Reason for exam::   Assistance in needle guidance and placement for procedures requiring needle placement in or near specific anatomical locations not easily accessible without such assistance.   Informed Consent Details: Physician/Practitioner Attestation; Transcribe to consent form and obtain patient signature    Nursing Order: Transcribe to consent form and obtain patient signature. Note: Always confirm laterality of pain with Ms. Katrinka Blazing, before procedure.    Physician/Practitioner attestation of informed consent for procedure/surgical case:   I, the physician/practitioner, attest that I have discussed with the patient  the benefits, risks, side effects, alternatives, likelihood of achieving goals and potential problems during recovery for the procedure that I have provided informed consent.    Procedure:   Sacroiliac Joint Block    Physician/Practitioner performing the procedure:   Benedetta Sundstrom A. Laban Emperor, MD    Indication/Reason:   Chronic Low Back and Hip Pain secondary to Sacroiliac Joint Pain (Arthralgia/Arthropathy)   Provide equipment / supplies at bedside    Procedure tray: "Block Tray" (Disposable  single use) Skin infiltration needle: Regular 1.5-in, 25-G, (x1) Block Needle type: Spinal Amount/quantity: 1 Size: Medium (5-inch) Gauge: 22G    Standing Status:   Standing    Number of Occurrences:   1    Specify:   Block Tray   Saline lock IV    Have LR [phone] mL available and administer at 125 mL/hr if patient becomes hypotensive.    Standing Status:   Standing    Number of Occurrences:   1   Chronic Opioid Analgesic:  No chronic opioid analgesics therapy prescribed by our practice. None MME/day: 0 mg/day   Medications ordered for procedure: Meds ordered this encounter  Medications   lidocaine (XYLOCAINE) 2 % (with pres) injection 400 mg   pentafluoroprop-tetrafluoroeth (GEBAUERS) aerosol   midazolam (VERSED) injection 0.5-2 mg    Make sure Flumazenil is available in the pyxis when using this medication. If oversedation occurs, administer 0.2 mg IV over 15 sec. If after 45 sec no response, administer 0.2 mg again over 1 min; may repeat at 1 min intervals; not to exceed 4 doses (1 mg)   ropivacaine (PF) 2 mg/mL (0.2%) (NAROPIN) injection 9 mL   methylPREDNISolone acetate (DEPO-MEDROL) injection 80 mg   Medications administered: [name] had no medications administered during this visit.  See the medical record for exact dosing, route, and time of administration.  Follow-up plan:   Return in about 2 weeks (around 09/04/2023) for (Face2F), (PPE).       Interventional Therapies  Risk  Factors  Considerations  Medical Comorbidities:  T2IDDM  MO (  BMI>35)  CAD s/p CABG x 3  Hx A-Fib/Flutter  Hx. Orthostatic Hypotension  Stage 3 CKD  GERD  HTN  Cystocele Grade 3  Incontinence     Planned  Pending:   Diagnostic right sacroiliac joint block #1 (Requested by referring neurosurgeon Dr. Tomma Rakers Abd-El-Barr )   Under consideration:   Diagnostic right sacroiliac joint block #1  Diagnostic right lumbar facet MBB #1    Completed:   None at this time   Therapeutic  Palliative (PRN) options:   None established   Completed by other providers:   None reported     Recent Visits Date Type Provider Dept  08/15/23 Office Visit Delano Metz, MD [facility]  Showing recent visits within past 90 days and meeting all other requirements Today's Visits Date Type Provider Dept  08/21/23 Procedure visit Delano Metz, MD [facility]  Showing today's visits and meeting all other requirements Future Appointments No visits were found meeting these conditions. Showing future appointments within next 90 days and meeting all other requirements  Disposition: Discharge home  Discharge (Date  Time): 08/21/2023;   hrs.   Primary Care Physician: Gracelyn Nurse, MD Location: [facility] Note by: Oswaldo Done, MD (TTS technology used. I apologize for any typographical errors that were not detected and corrected.) Date: 08/21/2023; Time: 12:19 PM  Disclaimer:  Medicine is not an Visual merchandiser. The only guarantee in medicine is that nothing is guaranteed. It is important to note that the decision to proceed with this intervention was based on the information collected from the patient. The Data and conclusions were drawn from the patient's questionnaire, the interview, and the physical examination. Because the information was provided in large part by the patient, it cannot be guaranteed that it has not been  purposely or unconsciously manipulated. Every effort has been made to obtain as much relevant data as possible for this evaluation. It is important to note that the conclusions that lead to this procedure are derived in large part from the available data. Always take into account that the treatment will also be dependent on availability of resources and existing treatment guidelines, considered by other Pain Management Practitioners as being common knowledge and practice, at the time of the intervention. For Medico-Legal purposes, it is also important to point out that variation in procedural techniques and pharmacological choices are the acceptable norm. The indications, contraindications, technique, and results of the above procedure should only be interpreted and judged by a Board-Certified Interventional Pain Specialist with extensive familiarity and expertise in the same exact procedure and technique.

## 2023-08-20 NOTE — Patient Instructions (Signed)

## 2023-08-20 NOTE — Progress Notes (Unsigned)
 PROVIDER NOTE: Information contained herein reflects review and annotations entered in association with encounter. Interpretation of such information and data should be left to medically-trained personnel. Information provided to patient can be located elsewhere in the medical record under "Patient Instructions". Document created using STT-dictation technology, any transcriptional errors that may result from process are unintentional.    Patient: Brandi [name]  Service Category: E/M  Provider: Oswaldo Done, MD  DOB: March 16, 1936  DOS: 08/21/2023  Referring Provider: Gracelyn Nurse, MD  MRN: [id number]  Specialty: Interventional Pain Management  PCP: Gracelyn Nurse, MD  Type: Established Patient  Setting: Ambulatory outpatient    Location: Office  Delivery: Face-to-face     Primary Reason(s) for Visit: Encounter for evaluation before starting new chronic pain management plan of care (Level of risk: moderate) CC: No chief complaint on file.  HPI  Brandi [name] is a 88 y.o. year old, female patient, who comes today for a follow-up evaluation to review the test results and decide on a treatment plan. She has Urinary tract infection without hematuria; Cystocele, grade 3; Incontinence; Severe sepsis (HCC); Acquired cyst of kidney; Action tremor; Benign essential hypertension; Chronic cystitis; Headache disorder; Coronary arteriosclerosis; Depression, major, in remission (HCC); Diabetic neuropathy (HCC); Obesity, unspecified; Orthostatic hypotension; Pernicious anemia; Positional lightheadedness; OSA on CPAP; Diabetes mellitus type 2, insulin dependent (HCC); Chest pain; Unstable angina (HCC); Recurrent syncope; CAD s/p CABG (coronary artery disease); AKI (acute kidney injury) (HCC); Acute hyponatremia; Dehydration; Diarrhea; Abdominal pain; Anxiety; Atrial fibrillation and flutter (HCC); Background diabetic retinopathy associated with type 2 diabetes mellitus (HCC); Chronic kidney disease, stage III  (moderate) (HCC); Decreased activities of daily living (ADL); Decreased strength, endurance, and mobility; H/O gastroesophageal reflux (GERD); Hyperlipidemia; Presence of drug coated stent in LAD coronary artery; S/P CABG x 3; Sacroiliitis (HCC); Urge incontinence; Status post laparoscopic hernia repair; Obesity (BMI 30-39.9); Anemia of chronic disease; HTN (hypertension), malignant; Hypertensive emergency; Peripheral vertigo; Acute sinusitis; A-fib s/p ligation of left atrial appendage; Obesity, Class III, BMI 40-49.9 (morbid obesity) (HCC); Chronic back pain; Thickened endometrium; Chronic pain syndrome; Pharmacologic therapy; Disorder of skeletal system; Problems influencing health status; Hypocalcemia; Decreased GFR; Elevated hemoglobin A1c; Abnormal CT scan, cervical spine (07/24/2021); Abnormal MRI, lumbar spine (04/06/2017); Chronic low back pain (1ry area of Pain) (Right) w/o sciatica; Low back pain radiating to leg (Right); Pain of lateral upper thigh (Right) (Intermittent); Chronic sacroiliac joint pain (Right); Somatic dysfunction of sacroiliac joint (Right); Grade 1 Anterolisthesis of lumbosacral spine of L3/L4 and L5/S1; and Lumbar facet joint pain on their problem list. Her primarily concern today is the No chief complaint on file.  Pain Assessment: Location:     Radiating:   Onset:   Duration:   Quality:   Severity:  /10 (subjective, self-reported pain score)  Effect on ADL:   Timing:   Modifying factors:   BP:    HR:    Brandi [name] comes in today for a follow-up visit after her initial evaluation on 08/15/2023. Today we went over the results of her tests. These were explained in "Layman's terms". During today's appointment we went over my diagnostic impression, as well as the proposed treatment plan.  ***  Discussed the use of AI scribe software for clinical note transcription with the patient, who gave verbal consent to proceed.  History of Present Illness          Patient  presented with interventional treatment options. Brandi [name] was informed that I will not be providing medication management.  Pharmacotherapy evaluation including recommendations may be offered, if specifically requested.   Controlled Substance Pharmacotherapy Assessment REMS (Risk Evaluation and Mitigation Strategy)  Opioid Analgesic: No chronic opioid analgesics therapy prescribed by our practice. None MME/day: 0 mg/day  Pill Count: None expected due to no prior prescriptions written by our practice. No notes on file Pharmacokinetics: Liberation and absorption (onset of action): WNL Distribution (time to peak effect): WNL Metabolism and excretion (duration of action): WNL         Pharmacodynamics: Desired effects: Analgesia: Brandi [name] reports >50% benefit. Functional ability: Patient reports that medication allows her to accomplish basic ADLs Clinically meaningful improvement in function (CMIF): Sustained CMIF goals met Perceived effectiveness: Described as relatively effective, allowing for increase in activities of daily living (ADL) Undesirable effects: Side-effects or Adverse reactions: None reported Monitoring: [place] PMP: PDMP reviewed during this encounter. Online review of the past 46-month period previously conducted. Not applicable at this point since we have not taken over the patient's medication management yet. List of other Serum/Urine Drug Screening Test(s):  No results found for: "AMPHSCRSER", "BARBSCRSER", "BENZOSCRSER", "COCAINSCRSER", "COCAINSCRNUR", "PCPSCRSER", "THCSCRSER", "THCU", "CANNABQUANT", "OPIATESCRSER", "OXYSCRSER", "PROPOXSCRSER", "ETH", "CBDTHCR", "D8THCCBX", "D9THCCBX" List of all UDS test(s) done:  No results found for: "TOXASSSELUR", "SUMMARY" Last UDS on record: No results found for: "TOXASSSELUR", "SUMMARY" UDS interpretation: No unexpected findings.          Medication Assessment Form: Not applicable. No opioids. Treatment compliance: Not  applicable Risk Assessment Profile: Aberrant behavior: See initial evaluations. None observed or detected today Comorbid factors increasing risk of overdose: See initial evaluation. No additional risks detected today Opioid risk tool (ORT):      No data to display          ORT Scoring interpretation table:  Score <3 = Low Risk for SUD  Score between 4-7 = Moderate Risk for SUD  Score >8 = High Risk for Opioid Abuse   Risk of substance use disorder (SUD): Low  Risk Mitigation Strategies:  Patient opioid safety counseling: No controlled substances prescribed. Patient-Prescriber Agreement (PPA): No agreement signed.  Controlled substance notification to other providers: None required. No opioid therapy.  Pharmacologic Plan: Non-opioid analgesic therapy offered. Interventional alternatives discussed.             Laboratory Chemistry Profile   Renal Lab Results  Component Value Date   BUN 12 05/30/2023   CREATININE 0.97 05/30/2023   LABCREA 29 04/13/2020   GFRAA 54 (L) 05/24/2018   GFRNONAA 57 (L) 05/30/2023   SPECGRAV 1.010 05/04/2022   PHUR 6.5 05/04/2022   PROTEINUR NEGATIVE 05/11/2023     Electrolytes Lab Results  Component Value Date   NA 137 05/30/2023   K 4.2 05/30/2023   CL 103 05/30/2023   CALCIUM 8.8 (L) 05/30/2023   MG 1.9 05/13/2023   PHOS 3.8 02/06/2022     Hepatic Lab Results  Component Value Date   AST 21 02/22/2023   ALT 17 02/22/2023   ALBUMIN 3.6 02/22/2023   ALKPHOS 66 02/22/2023   LIPASE 25 02/22/2023     ID Lab Results  Component Value Date   SARSCOV2NAA NEGATIVE 10/13/2020   MRSAPCR NEGATIVE 04/14/2016     Bone Lab Results  Component Value Date   25OHVITD1 WILL FOLLOW 08/15/2023   25OHVITD2 WILL FOLLOW 08/15/2023   25OHVITD3 WILL FOLLOW 08/15/2023     Endocrine Lab Results  Component Value Date   GLUCOSE 179 (H) 05/30/2023   GLUCOSEU NEGATIVE 05/11/2023   HGBA1C 6.5 (H) 05/10/2023  TSH 2.348 05/10/2023   FREET4 0.81  02/03/2022     Neuropathy Lab Results  Component Value Date   VITAMINB12 888 08/15/2023   FOLATE 26.0 02/04/2022   HGBA1C 6.5 (H) 05/10/2023     CNS No results found for: "COLORCSF", "APPEARCSF", "RBCCOUNTCSF", "WBCCSF", "POLYSCSF", "LYMPHSCSF", "EOSCSF", "PROTEINCSF", "GLUCCSF", "JCVIRUS", "CSFOLI", "IGGCSF", "LABACHR", "ACETBL"   Inflammation (CRP: Acute  ESR: Chronic) Lab Results  Component Value Date   CRP 2 08/15/2023   ESRSEDRATE 2 08/15/2023   LATICACIDVEN 1.8 10/24/2022     Rheumatology No results found for: "RF", "ANA", "LABURIC", "URICUR", "LYMEIGGIGMAB", "LYMEABIGMQN", "HLAB27"   Coagulation Lab Results  Component Value Date   INR 0.9 04/14/2020   LABPROT 12.1 04/14/2020   PLT 183 05/30/2023   DDIMER 0.55 (H) 02/03/2022     Cardiovascular Lab Results  Component Value Date   BNP 100.0 04/14/2016   CKTOTAL 54 06/30/2011   CKMB < 0.5 (L) 06/30/2011   TROPONINI 0.24 (HH) 05/21/2018   HGB 12.8 05/30/2023   HCT 40.0 05/30/2023     Screening Lab Results  Component Value Date   SARSCOV2NAA NEGATIVE 10/13/2020   MRSAPCR NEGATIVE 04/14/2016     Cancer No results found for: "CEA", "CA125", "LABCA2"   Allergens No results found for: "ALMOND", "APPLE", "ASPARAGUS", "AVOCADO", "BANANA", "BARLEY", "BASIL", "BAYLEAF", "GREENBEAN", "LIMABEAN", "WHITEBEAN", "BEEFIGE", "REDBEET", "BLUEBERRY", "BROCCOLI", "CABBAGE", "MELON", "CARROT", "CASEIN", "CASHEWNUT", "CAULIFLOWER", "CELERY"     Note: Lab results reviewed.  Recent Diagnostic Imaging Review  Cervical Imaging: Cervical MR wo contrast: No results found for this or any previous visit.  Cervical MR wo contrast: No results found for this or any previous visit.  Cervical CT wo contrast: Results for orders placed during the hospital encounter of 07/24/21  CT Cervical Spine Wo Contrast  Narrative CLINICAL DATA:  88 year old female status post fall. Vertigo.  EXAM: CT CERVICAL SPINE WITHOUT  CONTRAST  TECHNIQUE: Multidetector CT imaging of the cervical spine was performed without intravenous contrast. Multiplanar CT image reconstructions were also generated.  RADIATION DOSE REDUCTION: This exam was performed according to the departmental dose-optimization program which includes automated exposure control, adjustment of the mA and/or kV according to patient size and/or use of iterative reconstruction technique.  COMPARISON:  CT head and face today.  FINDINGS: Alignment: Preserved cervical lordosis. Cervicothoracic junction alignment is within normal limits. Bilateral posterior element alignment is within normal limits.  Skull base and vertebrae: Visualized skull base is intact. No atlanto-occipital dissociation. C1 and C2 appear intact and aligned. Some joint space loss, greater on the right. No acute osseous abnormality identified.  Soft tissues and spinal canal: No prevertebral fluid or swelling. No visible canal hematoma. Bulky cervical carotid calcified atherosclerosis. Partially retropharyngeal course of the CCA is. Otherwise negative visible noncontrast neck soft tissues.  Disc levels: C1-C2 degeneration. Widespread cervical facet arthropathy greater on the right. Evidence of C3-C4 ankylosis. Mild for age disc and endplate degeneration elsewhere. No cervical spinal stenosis suspected.  Upper chest: Prior sternotomy. Extensive calcified atherosclerosis of the proximal left subclavian artery. Negative lung apices. Visible upper thoracic levels appear grossly intact.  IMPRESSION: 1. No acute traumatic injury identified in the cervical spine. 2. Widespread cervical spine degeneration. C3-C4 ankylosis. No cervical spinal stenosis suspected. 3. Bulky calcified carotid and left subclavian artery atherosclerosis.   Electronically Signed By: Odessa Fleming M.D. On: 07/24/2021 08:53  Cervical DG Bending/F/E views: No results found for this or any previous  visit.   Shoulder Imaging: Shoulder-R MR wo contrast: No results found  for this or any previous visit.  Shoulder-L MR wo contrast: No results found for this or any previous visit.  Shoulder-R DG: No results found for this or any previous visit.  Shoulder-L DG: No results found for this or any previous visit.   Thoracic Imaging: Thoracic MR wo contrast: No results found for this or any previous visit.  Thoracic MR wo contrast: No results found for this or any previous visit.  Thoracic CT wo contrast: No results found for this or any previous visit.  Thoracic DG 4 views: No results found for this or any previous visit.  Thoracic DG w/swimmers view: No results found for this or any previous visit.   Lumbosacral Imaging: Lumbar MR wo contrast: Results for orders placed during the hospital encounter of 04/06/17  MR LUMBAR SPINE WO CONTRAST  Narrative CLINICAL DATA:  Low back pain.  Right-sided sciatica  EXAM: MRI LUMBAR SPINE WITHOUT CONTRAST  TECHNIQUE: Multiplanar, multisequence MR imaging of the lumbar spine was performed. No intravenous contrast was administered.  COMPARISON:  Lumbar MRI 11/03/2010  FINDINGS: Segmentation:  Normal  Alignment: Mild anterolisthesis L3-4 has developed since prior study. 3 mm anterolisthesis L5-S1 has developed since the prior study.  Vertebrae: Negative for fracture or mass. Hemangioma L3 vertebral body on the left.  Conus medullaris: Extends to the L1 level and appears normal.  Paraspinal and other soft tissues: Right renal cyst. Negative for mass or fluid collection.  Disc levels:  L1-2:  Negative  L2-3:  Negative  L3-4: Mild anterolisthesis. Moderate to advanced facet degeneration with significant progression from the prior study. Mild narrowing of the canal without significant spinal stenosis. Neural foramina patent.  L4-5: Moderate disc degeneration similar to the prior study. Disc bulging and diffuse endplate  spurring. Mild to moderate subarticular foraminal stenosis on the left. Central canal mildly narrowed  L5-S1: 3 mm anterolisthesis. Moderate facet degeneration. Negative for stenosis.  IMPRESSION: Progressive facet degeneration L3-4 with mild anterolisthesis. Mild narrowing of the spinal canal without significant spinal stenosis  Moderate disc degeneration and spurring L4-5, unchanged from the prior study. Mild to moderate subarticular stenosis on the left  3 mm anterolisthesis L5-S1 which has developed since the prior study. No significant stenosis.   Electronically Signed By: Marlan Palau M.D. On: 04/06/2017 12:44  Lumbar MR wo contrast: No results found for this or any previous visit.  Lumbar CT wo contrast: No results found for this or any previous visit.  Lumbar DG Bending views: No results found for this or any previous visit.         Sacroiliac Joint Imaging: Sacroiliac Joint DG: No results found for this or any previous visit.   Hip Imaging: Hip-R MR wo contrast: Results for orders placed during the hospital encounter of 04/09/15  MR Hip Right Wo Contrast  Narrative CLINICAL DATA:  Acute right hip pain. The patient fell on 03/20/2015.  EXAM: MR OF THE RIGHT HIP WITHOUT CONTRAST  TECHNIQUE: Multiplanar, multisequence MR imaging was performed. No intravenous contrast was administered.  COMPARISON:  Report of radiographs dated 03/22/2015  FINDINGS: Bones: There are fractures of the right inferior and superior pubic rami as well as an oblique fracture through the right pubic body. There is a subtle vertical fracture through the right sacral ala.  There is slight edema in the adductor muscles of the right hip due to the pubic rami fractures. The acetabulum and proximal right femur are normal. There is slight enthesophyte formation on the right greater trochanter but  there is no fracture.  There are no hip joint effusions. Muscle structures are  bilaterally symmetrical and normal. No bursitis. No soft tissue hematoma is or appreciable contusions.  No adenopathy or mass lesions.  The patient does have multilevel degenerative disc and joint disease in the lower lumbar spine.  IMPRESSION: Acute or subacute fractures of the right pubic rami and right pubic body and right sacral ala.  No fracture of the right hip.   Electronically Signed By: Francene Boyers M.D. On: 04/09/2015 09:25  Hip-L MR wo contrast: No results found for this or any previous visit.  Hip-R CT wo contrast: No results found for this or any previous visit.  Hip-L CT wo contrast: No results found for this or any previous visit.  Hip-R DG 2-3 views: No results found for this or any previous visit.  Hip-L DG 2-3 views: No results found for this or any previous visit.  Hip-B DG Bilateral: No results found for this or any previous visit.  Hip-B DG Bilateral (5V): No results found for this or any previous visit.   Knee Imaging: Knee-R MR wo contrast: No results found for this or any previous visit.  Knee-L MR wo contrast: No results found for this or any previous visit.  Knee-R CT wo contrast: No results found for this or any previous visit.  Knee-L CT wo contrast: No results found for this or any previous visit.  Knee-R DG 4 views: No results found for this or any previous visit.  Knee-L DG 4 views: No results found for this or any previous visit.   Ankle Imaging: Ankle-R DG Complete: No results found for this or any previous visit.  Ankle-L DG Complete: No results found for this or any previous visit.   Foot Imaging: Foot-R DG Complete: No results found for this or any previous visit.  Foot-L DG Complete: No results found for this or any previous visit.   Elbow Imaging: Elbow-R DG Complete: No results found for this or any previous visit.  Elbow-L DG Complete: No results found for this or any previous visit.   Wrist Imaging: Wrist-R  DG Complete: Results for orders placed during the hospital encounter of 07/24/21  DG Wrist Complete Right  Addendum 07/24/2021  3:11 PM ADDENDUM REPORT: 07/24/2021 15:08  ADDENDUM: Please see the addendum  CLINICAL DATA:  Pain status post fall.  EXAM: Left forearm-two views; right wrist complete 3+ views.  FINDINGS/IMPRESSION: RIGHT WRIST: There is a mildly displaced fracture of the distal ulna with approximately half shaft width ulnar displacement and minimal overlap of the fracture fragments.  Triscaphe joint osteoarthritis.  Left forearm.  No acute fracture or dislocation of the left forearm. Chronic healed fracture of the left distal radius.   Electronically Signed By: Larose Hires D.O. On: 07/24/2021 15:08  Addendum 07/24/2021 11:51 AM ADDENDUM REPORT: 07/24/2021 11:48  ADDENDUM: The wrist exam was incorrectly labeled as left.  There is a displaced fracture of the right distal radius.  There is no appreciable fracture of the left radius.   Electronically Signed By: Larose Hires D.O. On: 07/24/2021 11:48  Narrative CLINICAL DATA:  Pain post fall.  EXAM: LEFT FOREARM - 2 VIEW; RIGHT WRIST - COMPLETE 3+ VIEW  COMPARISON:  None.  FINDINGS: Left wrist: Mildly displaced impaction fracture of the distal radius. First carpometacarpal and triscaphe joint arthropathy. Osteopenia. Soft tissue swelling about the wrist.  Left forearm. Distal radial fracture as described above. No other appreciable fracture or dislocation. Osteopenia.  IMPRESSION: Mildly displaced impaction fracture of the distal radius.  Osteoarthritis of the first carpometacarpal joint and triscaphe joints.  Electronically Signed: By: Larose Hires D.O. On: 07/24/2021 09:07  Wrist-L DG Complete: Results for orders placed during the hospital encounter of 05/07/16  DG Wrist Complete Left  Narrative CLINICAL DATA:  Fall, left wrist pain  EXAM: LEFT WRIST - COMPLETE 3+  VIEW  COMPARISON:  None.  FINDINGS: There is a fracture through the distal left radius. No significant displacement. No ulnar abnormality. No subluxation or dislocation.  IMPRESSION: Nondisplaced distal left radial fracture.   Electronically Signed By: Charlett Nose M.D. On: 05/07/2016 13:21   Hand Imaging: Hand-R DG Complete: No results found for this or any previous visit.  Hand-L DG Complete: No results found for this or any previous visit.   Complexity Note: Imaging results reviewed.                         Meds   Current Outpatient Medications:    acetaminophen (TYLENOL) 500 MG tablet, Take 1,000 mg by mouth 3 (three) times daily., Disp: , Rfl:    aspirin 81 MG EC tablet, Take 81 mg by mouth daily. , Disp: , Rfl:    atorvastatin (LIPITOR) 40 MG tablet, Take 40 mg by mouth every evening., Disp: , Rfl:    budesonide-formoterol (SYMBICORT) 80-4.5 MCG/ACT inhaler, Inhale 2 puffs into the lungs 2 (two) times daily., Disp: , Rfl:    Calcium Carb-Cholecalciferol (CALCIUM CARBONATE+VITAMIN D PO), Take 1 tablet by mouth daily at 6 (six) AM., Disp: , Rfl:    carboxymethylcellulose (REFRESH PLUS) 0.5 % SOLN, Apply 1-2 drops to eye at bedtime. , Disp: , Rfl:    carvedilol (COREG) 25 MG tablet, Take 1 tablet (25 mg total) by mouth 2 (two) times daily with a meal., Disp: 60 tablet, Rfl: 0   Cyanocobalamin (VITAMIN B-12 PO), Take 1 tablet by mouth daily., Disp: , Rfl:    famotidine (PEPCID) 20 MG tablet, Take 20 mg by mouth 2 (two) times daily., Disp: , Rfl:    ferrous sulfate 325 (65 FE) MG tablet, Take 325 mg by mouth daily with breakfast., Disp: , Rfl:    gabapentin (NEURONTIN) 400 MG capsule, Take 400 mg by mouth 3 (three) times daily., Disp: , Rfl:    glipiZIDE (GLUCOTROL) 5 MG tablet, Take 1 tablet (5 mg total) by mouth daily. (Patient taking differently: Take 5 mg by mouth daily before breakfast.), Disp: 30 tablet, Rfl: 2   hydrALAZINE (APRESOLINE) 10 MG tablet, Take 1 tablet (10  mg total) by mouth 3 (three) times daily., Disp: 90 tablet, Rfl: 0   Insulin Glargine (LANTUS SOLOSTAR) 100 UNIT/ML Solostar Pen, Inject 10 Units into the skin every morning., Disp: , Rfl:    JANUMET XR 50-1000 MG TB24, Take 1 tablet by mouth 2 (two) times daily., Disp: , Rfl:    loratadine (CLARITIN) 10 MG tablet, Take 10 mg by mouth every morning., Disp: , Rfl:    losartan (COZAAR) 100 MG tablet, Take 100 mg by mouth at bedtime., Disp: , Rfl:    meclizine (ANTIVERT) 12.5 MG tablet, Take 12.5 mg by mouth 3 (three) times daily as needed for dizziness., Disp: , Rfl:    Melatonin 10 MG TABS, Take 1 tablet by mouth at bedtime., Disp: , Rfl:    mirabegron ER (MYRBETRIQ) 50 MG TB24 tablet, Take 1 tablet (50 mg total) by mouth daily., Disp: 90 tablet, Rfl: 3  Multiple Vitamins-Minerals (PRESERVISION/LUTEIN) CAPS, Take 1 capsule by mouth 2 (two) times daily., Disp: 60 capsule, Rfl: 1   nitroGLYCERIN (NITROSTAT) 0.4 MG SL tablet, Place 0.4 mg under the tongue every 5 (five) minutes as needed for chest pain., Disp: , Rfl:    progesterone (PROMETRIUM) 100 MG capsule, Take 100 mg by mouth at bedtime., Disp: , Rfl:    sodium chloride (OCEAN) 0.65 % nasal spray, Place 1 spray into the nose as needed., Disp: , Rfl:    Verapamil HCl CR 300 MG CP24, Take 1 capsule by mouth at bedtime., Disp: , Rfl:   ROS  Constitutional: Denies any fever or chills Gastrointestinal: No reported hemesis, hematochezia, vomiting, or acute GI distress Musculoskeletal: Denies any acute onset joint swelling, redness, loss of ROM, or weakness Neurological: No reported episodes of acute onset apraxia, aphasia, dysarthria, agnosia, amnesia, paralysis, loss of coordination, or loss of consciousness  Allergies  Ms. Holian is allergic to accupril [quinapril hcl], micardis [telmisartan], and quinapril.  PFSH  Drug: Ms. Sheffer  reports no history of drug use. Alcohol:  reports no history of alcohol use. Tobacco:  reports that she has  never smoked. She has never used smokeless tobacco. Medical:  has a past medical history of Acquired cyst of kidney, Anemia, Anxiety, Aortic atherosclerosis (HCC), Arthritis, degenerative, Atrial fibrillation and flutter (HCC), Baden-Walker grade 3 cystocele, Chronic cystitis, Coronary artery disease (08/05/2010), Depression, Diabetic neuropathy (HCC), Dyspnea, GERD (gastroesophageal reflux disease), Headache, History of left atrial appendage closure (05/29/2018), HLD (hyperlipidemia), HTN (hypertension), Implantable loop recorder present (09/20/2016), Insomnia, Murmur, NSTEMI (non-ST elevated myocardial infarction) (HCC) (05/20/2018), NSVT (nonsustained ventricular tachycardia) (HCC) (01/2022), Obesity, OSA on CPAP, PONV (postoperative nausea and vomiting), Presence of drug coated stent in LAD coronary artery, PSVT (paroxysmal supraventricular tachycardia) (HCC) (01/2022), 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 Surgical: Ms. Dick  has a past surgical history that includes Cholecystectomy; Total knee arthroplasty (Bilateral); Septoplasty; Bunionectomy; LOOP RECORDER INSERTION (N/A, 09/20/2016); Colonoscopy with propofol (N/A, 06/14/2017); Breast biopsy (Right, 2004); LEFT HEART CATH AND CORONARY ANGIOGRAPHY (N/A, 05/22/2018); XI Robotic assisted inguinal hernia repair with mesh (Left, 10/14/2020); Coronary artery bypass graft (N/A, 05/29/2018); LEFT ATRIAL APPENDAGE OCCLUSION (Left, 05/29/2018); Coronary angioplasty with stent (Left, 08/05/2010); LEFT HEART CATH AND CORONARY ANGIOGRAPHY (Left, 07/03/2011); LEFT HEART CATH AND CORONARY ANGIOGRAPHY (Left, 05/22/2018); and Hysteroscopy with D & C (N/A, 12/14/2022). Family: family history includes Bladder Cancer in her maternal aunt; Breast cancer in her cousin; COPD in an other family member; Diabetes in an other  family member; Hypercholesterolemia in an other family member; Hypertension in an other family member; Skin cancer in an other family member.  Constitutional Exam  General appearance: Well nourished, well developed, and well hydrated. In no apparent acute distress There were no vitals filed for this visit. BMI Assessment: Estimated body mass index is 36.39 kg/m as calculated from the following:   Height as of 08/15/23: 5\' 1"  (1.549 m).   Weight as of 08/15/23: 192 lb 9.6 oz (87.4 kg).  BMI interpretation table: BMI level Category Range association with higher incidence of chronic pain  <18 kg/m2 Underweight   18.5-24.9 kg/m2 Ideal body weight   25-29.9 kg/m2 Overweight Increased incidence by 20%  30-34.9 kg/m2 Obese (Class I) Increased incidence by 68%  35-39.9 kg/m2 Severe obesity (Class II) Increased incidence by 136%  >40 kg/m2 Extreme obesity (Class III) Increased incidence by 254%  Patient's current BMI Ideal Body weight  There is no height or weight on file to calculate BMI. Ideal body weight: 47.8 kg (105 lb 6.1 oz) Adjusted ideal body weight: 63.6 kg (140 lb 4.3 oz)   BMI Readings from Last 4 Encounters:  08/15/23 36.39 kg/m  05/30/23 36.84 kg/m  05/10/23 35.90 kg/m  03/04/23 37.22 kg/m   Wt Readings from Last 4 Encounters:  08/15/23 192 lb 9.6 oz (87.4 kg)  05/30/23 195 lb (88.5 kg)  05/10/23 190 lb (86.2 kg)  03/04/23 197 lb (89.4 kg)    Psych/Mental status: Alert, oriented x 3 (person, place, & time)       Eyes: PERLA Respiratory: No evidence of acute respiratory distress  Assessment & Plan  Primary Diagnosis & Pertinent Problem List: There were no encounter diagnoses. Visit Diagnosis: No diagnosis found. Problems updated and reviewed during this visit: No problems updated.  Plan of Care  Assessment and Plan             Pharmacotherapy (Medications Ordered): No orders of the defined types were placed in this encounter.  Procedure Orders    No  procedure(s) ordered today   Lab Orders  No laboratory test(s) ordered today   Imaging Orders  No imaging studies ordered today   Referral Orders  No referral(s) requested today    Pharmacological management:  Opioid Analgesics: I will not be prescribing any opioids at this time Membrane stabilizer: I will not be prescribing any at this time Muscle relaxant: I will not be prescribing any at this time NSAID: I will not be prescribing any at this time Other analgesic(s): I will not be prescribing any at this time      Interventional Therapies  Risk Factors  Considerations  Medical Comorbidities:     Planned  Pending:   Diagnostic right sacroiliac joint block #1 (Requested by referring neurosurgeon Dr. Tomma Rakers Abd-El-Barr )   Under consideration:   Diagnostic right sacroiliac joint block #1  Diagnostic right lumbar facet MBB #1    Completed:   None at this time   Therapeutic  Palliative (PRN) options:   None established   Completed by other providers:   None reported       Provider-requested follow-up: No follow-ups on file. Recent Visits Date Type Provider Dept  08/15/23 Office Visit Delano Metz, MD [facility]  Showing recent visits within past 90 days and meeting all other requirements Future Appointments Date Type Provider Dept  08/21/23 Appointment Delano Metz, MD [facility]  Showing future appointments within next 90 days and meeting all other requirements   Primary Care Physician: Gracelyn Nurse, MD  Duration of encounter: *** minutes.  Total time on encounter, as per AMA guidelines included both the face-to-face and non-face-to-face time personally spent by the physician and/or other qualified health care professional(s) on the day of the encounter (includes time in activities that require the physician or other qualified health care professional and does not include time in activities normally performed by  clinical staff). Physician's time may include the following activities when performed: Preparing to see the patient (e.g., pre-charting review of records, searching for previously ordered imaging, lab work, and nerve conduction tests) Review of prior analgesic pharmacotherapies. Reviewing PMP Interpreting ordered tests (e.g., lab work, imaging, nerve conduction tests) Performing post-procedure evaluations, including interpretation of diagnostic procedures Obtaining and/or reviewing separately obtained history Performing a medically appropriate examination and/or evaluation Counseling and educating the patient/family/caregiver Ordering medications, tests, or procedures  Referring and communicating with other health care professionals (when not separately reported) Documenting clinical information in the electronic or other health record Independently interpreting results (not separately reported) and communicating results to the patient/ family/caregiver Care coordination (not separately reported)  Note by: Oswaldo Done, MD (TTS technology used. I apologize for any typographical errors that were not detected and corrected.) Date: 08/21/2023; Time: 7:12 AM

## 2023-08-21 ENCOUNTER — Ambulatory Visit
Admission: RE | Admit: 2023-08-21 | Discharge: 2023-08-21 | Disposition: A | Discharge status: Home / Self Care | Source: Ambulatory Visit | Attending: Pain Medicine | Admitting: Pain Medicine

## 2023-08-21 ENCOUNTER — Encounter: Payer: Self-pay | Admitting: Pain Medicine

## 2023-08-21 ENCOUNTER — Ambulatory Visit: Payer: Medicare PPO | Attending: Pain Medicine | Admitting: Pain Medicine

## 2023-08-21 VITALS — BP 191/75 | HR 73 | Temp 97.3°F | Resp 16 | Ht 61.0 in | Wt 194.0 lb

## 2023-08-21 DIAGNOSIS — Z6836 Body mass index (BMI) 36.0-36.9, adult: Secondary | ICD-10-CM | POA: Insufficient documentation

## 2023-08-21 DIAGNOSIS — Z794 Long term (current) use of insulin: Secondary | ICD-10-CM | POA: Diagnosis not present

## 2023-08-21 DIAGNOSIS — I4891 Unspecified atrial fibrillation: Secondary | ICD-10-CM | POA: Insufficient documentation

## 2023-08-21 DIAGNOSIS — I129 Hypertensive chronic kidney disease with stage 1 through stage 4 chronic kidney disease, or unspecified chronic kidney disease: Secondary | ICD-10-CM | POA: Diagnosis not present

## 2023-08-21 DIAGNOSIS — I25810 Atherosclerosis of coronary artery bypass graft(s) without angina pectoris: Secondary | ICD-10-CM | POA: Diagnosis not present

## 2023-08-21 DIAGNOSIS — E1122 Type 2 diabetes mellitus with diabetic chronic kidney disease: Secondary | ICD-10-CM | POA: Diagnosis not present

## 2023-08-21 DIAGNOSIS — M4726 Other spondylosis with radiculopathy, lumbar region: Secondary | ICD-10-CM | POA: Insufficient documentation

## 2023-08-21 DIAGNOSIS — D631 Anemia in chronic kidney disease: Secondary | ICD-10-CM | POA: Insufficient documentation

## 2023-08-21 DIAGNOSIS — Z833 Family history of diabetes mellitus: Secondary | ICD-10-CM | POA: Insufficient documentation

## 2023-08-21 DIAGNOSIS — M51379 Other intervertebral disc degeneration, lumbosacral region without mention of lumbar back pain or lower extremity pain: Secondary | ICD-10-CM | POA: Diagnosis not present

## 2023-08-21 DIAGNOSIS — M48061 Spinal stenosis, lumbar region without neurogenic claudication: Secondary | ICD-10-CM | POA: Insufficient documentation

## 2023-08-21 DIAGNOSIS — M9904 Segmental and somatic dysfunction of sacral region: Secondary | ICD-10-CM

## 2023-08-21 DIAGNOSIS — M47812 Spondylosis without myelopathy or radiculopathy, cervical region: Secondary | ICD-10-CM | POA: Insufficient documentation

## 2023-08-21 DIAGNOSIS — M461 Sacroiliitis, not elsewhere classified: Secondary | ICD-10-CM

## 2023-08-21 DIAGNOSIS — M4317 Spondylolisthesis, lumbosacral region: Secondary | ICD-10-CM | POA: Diagnosis not present

## 2023-08-21 DIAGNOSIS — N183 Chronic kidney disease, stage 3 unspecified: Secondary | ICD-10-CM | POA: Diagnosis not present

## 2023-08-21 DIAGNOSIS — R937 Abnormal findings on diagnostic imaging of other parts of musculoskeletal system: Secondary | ICD-10-CM

## 2023-08-21 DIAGNOSIS — Z8249 Family history of ischemic heart disease and other diseases of the circulatory system: Secondary | ICD-10-CM | POA: Insufficient documentation

## 2023-08-21 DIAGNOSIS — G894 Chronic pain syndrome: Secondary | ICD-10-CM | POA: Diagnosis present

## 2023-08-21 DIAGNOSIS — E1140 Type 2 diabetes mellitus with diabetic neuropathy, unspecified: Secondary | ICD-10-CM | POA: Diagnosis not present

## 2023-08-21 DIAGNOSIS — M545 Low back pain, unspecified: Secondary | ICD-10-CM | POA: Diagnosis not present

## 2023-08-21 DIAGNOSIS — M533 Sacrococcygeal disorders, not elsewhere classified: Secondary | ICD-10-CM

## 2023-08-21 DIAGNOSIS — M4316 Spondylolisthesis, lumbar region: Secondary | ICD-10-CM | POA: Insufficient documentation

## 2023-08-21 DIAGNOSIS — G4733 Obstructive sleep apnea (adult) (pediatric): Secondary | ICD-10-CM | POA: Insufficient documentation

## 2023-08-21 DIAGNOSIS — Z79899 Other long term (current) drug therapy: Secondary | ICD-10-CM | POA: Diagnosis not present

## 2023-08-21 DIAGNOSIS — G8929 Other chronic pain: Secondary | ICD-10-CM | POA: Diagnosis not present

## 2023-08-21 DIAGNOSIS — M79604 Pain in right leg: Secondary | ICD-10-CM

## 2023-08-21 DIAGNOSIS — Z7984 Long term (current) use of oral hypoglycemic drugs: Secondary | ICD-10-CM | POA: Insufficient documentation

## 2023-08-21 MED ORDER — PENTAFLUOROPROP-TETRAFLUOROETH EX AERO
INHALATION_SPRAY | Freq: Once | CUTANEOUS | Status: AC
  Administered 2023-08-21: 30 via TOPICAL

## 2023-08-21 MED ORDER — METHYLPREDNISOLONE ACETATE 80 MG/ML IJ SUSP
80.0000 mg | Freq: Once | INTRAMUSCULAR | Status: AC
Start: 2023-08-21 — End: 2023-08-21
  Administered 2023-08-21: 80 mg via INTRA_ARTICULAR
  Filled 2023-08-21: qty 1

## 2023-08-21 MED ORDER — ROPIVACAINE HCL 2 MG/ML IJ SOLN
9.0000 mL | Freq: Once | INTRAMUSCULAR | Status: AC
  Administered 2023-08-21: 9 mL via INTRA_ARTICULAR
  Filled 2023-08-21: qty 20

## 2023-08-21 MED ORDER — MIDAZOLAM HCL 2 MG/2ML IJ SOLN
0.5000 mg | Freq: Once | INTRAMUSCULAR | Status: AC
  Administered 2023-08-21: 1 mg via INTRAVENOUS
  Filled 2023-08-21: qty 2

## 2023-08-21 MED ORDER — LIDOCAINE HCL 2 % IJ SOLN
20.0000 mL | Freq: Once | INTRAMUSCULAR | Status: AC
Start: 2023-08-21 — End: 2023-08-21
  Administered 2023-08-21: 400 mg
  Filled 2023-08-21: qty 20

## 2023-08-22 ENCOUNTER — Telehealth: Payer: Self-pay

## 2023-08-22 NOTE — Telephone Encounter (Signed)
 Post procedure follow up.  Patient states she is doing good.

## 2023-08-23 ENCOUNTER — Ambulatory Visit: Payer: Medicare PPO | Admitting: Pain Medicine

## 2023-08-24 LAB — C-REACTIVE PROTEIN: CRP: 2 mg/L (ref 0–10)

## 2023-08-24 LAB — SEDIMENTATION RATE: Sed Rate: 2 mm/h (ref 0–40)

## 2023-08-24 LAB — 25-HYDROXY VITAMIN D LCMS D2+D3
25-Hydroxy, Vitamin D-2: <1 ng/mL
25-Hydroxy, Vitamin D-3: 68 ng/mL
25-Hydroxy, Vitamin D: 68 ng/mL

## 2023-08-24 LAB — VITAMIN B12: Vitamin B-12: 888 pg/mL (ref 232–1245)

## 2023-09-03 NOTE — Progress Notes (Unsigned)
 PROVIDER NOTE: Information contained herein reflects review and annotations entered in association with encounter. Interpretation of such information and data should be left to medically-trained personnel. Information provided to patient can be located elsewhere in the medical record under "Patient Instructions". Document created using STT-dictation technology, any transcriptional errors that may result from process are unintentional.    Patient: Brandi [name]  Service Category: E/M  Provider: Oswaldo Done, MD  DOB: 15-Aug-1935  DOS: 09/04/2023  Referring Provider: Gracelyn Nurse, MD  MRN: [id number]  Specialty: Interventional Pain Management  PCP: Gracelyn Nurse, MD  Type: Established Patient  Setting: Ambulatory outpatient    Location: Office  Delivery: Face-to-face     HPI  Brandi [name], a 88 y.o. year old female, is here today because of her No primary diagnosis found.. Brandi [name]'s primary complain today is No chief complaint on file.  Pertinent problems: Brandi [name] has Headache disorder; Diabetic neuropathy (HCC); Abdominal pain; Sacroiliitis (HCC); Chronic back pain; Chronic pain syndrome; Abnormal CT scan, cervical spine (07/24/2021); Abnormal MRI, lumbar spine (05/09/2023); Chronic low back pain (1ry area of Pain) (Right) w/o sciatica; Low back pain radiating to leg (Right); Pain of lateral upper thigh (Right) (Intermittent); Chronic sacroiliac joint pain (Right); Somatic dysfunction of sacroiliac joint (Right); Grade 1 Anterolisthesis of lumbosacral spine of L3/L4 and L5/S1; and Lumbar facet joint pain on their pertinent problem list. Pain Assessment: Severity of   is reported as a  /10. Location:    / . Onset:  . Quality:  . Timing:  . Modifying factor(s):  Marland Kitchen Vitals:  vitals were not taken for this visit.  BMI: Estimated body mass index is 36.66 kg/m as calculated from the following:   Height as of 08/21/23: 5\' 1"  (1.549 m).   Weight as of 08/21/23: 194 lb (88 kg). Last encounter:  08/15/2023. Last procedure: 08/21/2023.  Reason for encounter: post-procedure evaluation and assessment. ***  Discussed the use of AI scribe software for clinical note transcription with the patient, who gave verbal consent to proceed.  History of Present Illness          Post-procedure evaluation   Procedure: Sacroiliac Joint Steroid Injection #1    Laterality: Right     Level: PSIS (Posterior Superior Iliac Spine)  Imaging: Fluoroscopic guidance Anesthesia: Local anesthesia (1-2% Lidocaine) Anxiolysis: IV Versed         Sedation: No Sedation                       DOS: 08/21/2023  Performed by: Oswaldo Done, MD  Purpose: Diagnostic/Therapeutic Indications: Sacroiliac joint pain in the lower back and hip area severe enough to impact quality of life or function. Rationale (medical necessity): procedure needed and proper for the diagnosis and/or treatment of Brandi [name]'s medical symptoms and needs. 1. Chronic low back pain (1ry area of Pain) (Right) w/o sciatica   2. Chronic sacroiliac joint pain (Right)   3. Low back pain radiating to leg (Right)   4. Sacroiliitis (HCC)   5. Somatic dysfunction of sacroiliac joint (Right)   6. Abnormal MRI, lumbar spine (04/06/2017)    NAS-11 Pain score:   Pre-procedure: 3 /10   Post-procedure: 3 /10     Effectiveness:  Initial hour after procedure:   ***. Subsequent 4-6 hours post-procedure:   ***. Analgesia past initial 6 hours:   ***. Ongoing improvement:  Analgesic:  *** Function:    ***    ROM:    ***  Pharmacotherapy Assessment  Analgesic: No chronic opioid analgesics therapy prescribed by our practice. None MME/day: 0 mg/day   Monitoring: [place] PMP: PDMP reviewed during this encounter.       Pharmacotherapy: No side-effects or adverse reactions reported. Compliance: No problems identified. Effectiveness: Clinically acceptable.  No notes on file  No results found for: "CBDTHCR" No results found for: "D8THCCBX" No  results found for: "D9THCCBX"  UDS:  No results found for: "SUMMARY"    ROS  Constitutional: Denies any fever or chills Gastrointestinal: No reported hemesis, hematochezia, vomiting, or acute GI distress Musculoskeletal: Denies any acute onset joint swelling, redness, loss of ROM, or weakness Neurological: No reported episodes of acute onset apraxia, aphasia, dysarthria, agnosia, amnesia, paralysis, loss of coordination, or loss of consciousness  Medication Review  Calcium Carb-Cholecalciferol, Cyanocobalamin, Melatonin, PreserVision/Lutein, SitaGLIPtin-MetFORMIN HCl, Verapamil HCl CR, acetaminophen, aspirin EC, atorvastatin, budesonide-formoterol, carboxymethylcellulose, carvedilol, famotidine, ferrous sulfate, gabapentin, glipiZIDE, hydrALAZINE, insulin glargine, loratadine, losartan, meclizine, mirabegron ER, nitroGLYCERIN, progesterone, and sodium chloride  History Review  Allergy: Brandi [name] is allergic to accupril [quinapril hcl], micardis [telmisartan], and quinapril. Drug: Brandi [name]  reports no history of drug use. Alcohol:  reports no history of alcohol use. Tobacco:  reports that she has never smoked. She has never used smokeless tobacco. Social: Brandi [name]  reports that she has never smoked. She has never used smokeless tobacco. She reports that she does not drink alcohol and does not use drugs. Medical:  has a past medical history of Acquired cyst of kidney, Anemia, Anxiety, Aortic atherosclerosis (HCC), Arthritis, degenerative, Atrial fibrillation and flutter (HCC), Baden-Walker grade 3 cystocele, Chronic cystitis, Coronary artery disease (08/05/2010), Depression, Diabetic neuropathy (HCC), Dyspnea, GERD (gastroesophageal reflux disease), Headache, History of left atrial appendage closure (05/29/2018), HLD (hyperlipidemia), HTN (hypertension), Implantable loop recorder present (09/20/2016), Insomnia, Murmur, NSTEMI (non-ST elevated myocardial infarction) (HCC) (05/20/2018), NSVT  (nonsustained ventricular tachycardia) (HCC) (01/2022), Obesity, OSA on CPAP, PONV (postoperative nausea and vomiting), Presence of drug coated stent in LAD coronary artery, PSVT (paroxysmal supraventricular tachycardia) (HCC) (01/2022), 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 Surgical: Brandi [name]  has a past surgical history that includes Cholecystectomy; Total knee arthroplasty (Bilateral); Septoplasty; Bunionectomy; LOOP RECORDER INSERTION (N/A, 09/20/2016); Colonoscopy with propofol (N/A, 06/14/2017); Breast biopsy (Right, 2004); LEFT HEART CATH AND CORONARY ANGIOGRAPHY (N/A, 05/22/2018); XI Robotic assisted inguinal hernia repair with mesh (Left, 10/14/2020); Coronary artery bypass graft (N/A, 05/29/2018); LEFT ATRIAL APPENDAGE OCCLUSION (Left, 05/29/2018); Coronary angioplasty with stent (Left, 08/05/2010); LEFT HEART CATH AND CORONARY ANGIOGRAPHY (Left, 07/03/2011); LEFT HEART CATH AND CORONARY ANGIOGRAPHY (Left, 05/22/2018); and Hysteroscopy with D & C (N/A, 12/14/2022). Family: family history includes Bladder Cancer in her maternal aunt; Breast cancer in her cousin; COPD in an other family member; Diabetes in an other family member; Hypercholesterolemia in an other family member; Hypertension in an other family member; Skin cancer in an other family member.  Laboratory Chemistry Profile   Renal Lab Results  Component Value Date   BUN 12 05/30/2023   CREATININE 0.97 05/30/2023   LABCREA 29 04/13/2020   GFRAA 54 (L) 05/24/2018   GFRNONAA 57 (L) 05/30/2023    Hepatic Lab Results  Component Value Date   AST 21 02/22/2023   ALT 17 02/22/2023   ALBUMIN 3.6 02/22/2023   ALKPHOS 66 02/22/2023   LIPASE 25 02/22/2023    Electrolytes Lab Results  Component Value Date  NA 137 05/30/2023   K 4.2 05/30/2023   CL 103 05/30/2023   CALCIUM 8.8 (L)  05/30/2023   MG 1.9 05/13/2023   PHOS 3.8 02/06/2022    Bone Lab Results  Component Value Date   25OHVITD1 68 08/15/2023   25OHVITD2 <1.0 08/15/2023   25OHVITD3 68 08/15/2023    Inflammation (CRP: Acute Phase) (ESR: Chronic Phase) Lab Results  Component Value Date   CRP 2 08/15/2023   ESRSEDRATE 2 08/15/2023   LATICACIDVEN 1.8 10/24/2022         Note: Above Lab results reviewed.  Recent Imaging Review  DG Lumbar Spine Complete W/Bend CLINICAL DATA:  Chronic lower back pain.  EXAM: LUMBAR SPINE - COMPLETE WITH BENDING 7 VIEWS  COMPARISON:  None Available.  FINDINGS: Degenerative changes at L4-5 and L5-S1 with disc space narrowing. Grade 1 L4 retrolisthesis. Facet joint degenerative changes L3-4 through L5-S1. No compression deformities. No osteolytic or osteoblastic changes. Osseous structures are osteopenic. No motion on flexion and extension to suggest instability. Aortoiliac atheromatous calcifications.  IMPRESSION: Degenerative changes. Osteopenia. No acute osseous abnormalities.  Electronically Signed   By: Layla Maw M.D.   On: 09/02/2023 16:18 Note: Reviewed        Physical Exam  General appearance: Well nourished, well developed, and well hydrated. In no apparent acute distress Mental status: Alert, oriented x 3 (person, place, & time)       Respiratory: No evidence of acute respiratory distress Eyes: PERLA Vitals: There were no vitals taken for this visit. BMI: Estimated body mass index is 36.66 kg/m as calculated from the following:   Height as of 08/21/23: 5\' 1"  (1.549 m).   Weight as of 08/21/23: 194 lb (88 kg). Ideal: Ideal body weight: 47.8 kg (105 lb 6.1 oz) Adjusted ideal body weight: 63.9 kg (140 lb 13.2 oz)  Assessment   Diagnosis Status  No diagnosis found. Controlled Controlled Controlled   Updated Problems: No problems updated.  Plan of Care  Problem-specific:  Assessment and Plan            Brandi [name] has a  current medication list which includes the following long-term medication(s): budesonide-formoterol, carvedilol, gabapentin, glipizide, hydralazine, lantus solostar, janumet xr, losartan, mirabegron er, progesterone, sodium chloride, and verapamil hcl cr.  Pharmacotherapy (Medications Ordered): No orders of the defined types were placed in this encounter.  Orders:  No orders of the defined types were placed in this encounter.  Follow-up plan:   No follow-ups on file.      Interventional Therapies  Risk Factors  Considerations  Medical Comorbidities:  T2IDDM  MO (BMI>35)  CAD s/p CABG x 3  Hx A-Fib/Flutter  Hx. Orthostatic Hypotension  Stage 3 CKD  GERD  HTN  Cystocele Grade 3  Incontinence     Planned  Pending:   Diagnostic right sacroiliac joint block #1 (Requested by referring neurosurgeon Dr. Tomma Rakers Abd-El-Barr )   Under consideration:   Diagnostic right sacroiliac joint block #1  Diagnostic right lumbar facet MBB #1    Completed:   None at this time   Therapeutic  Palliative (PRN) options:   None established   Completed by other providers:   None reported      Recent Visits Date Type Provider Dept  08/21/23 Procedure visit Delano Metz, MD [facility]  08/15/23 Office Visit Delano Metz, MD [facility]  Showing recent visits within past 90 days and meeting all other requirements Future Appointments Date Type Provider Dept  09/04/23 Appointment Delano Metz, MD [facility]  Showing future appointments within next 90 days and meeting all other requirements  I discussed the assessment and treatment plan with the patient. The patient was provided an opportunity to ask questions and all were answered. The patient agreed with the plan and demonstrated an understanding of the instructions.  Patient advised to call back or seek an in-person evaluation if the symptoms or condition worsens.  Duration of  encounter: *** minutes.  Total time on encounter, as per AMA guidelines included both the face-to-face and non-face-to-face time personally spent by the physician and/or other qualified health care professional(s) on the day of the encounter (includes time in activities that require the physician or other qualified health care professional and does not include time in activities normally performed by clinical staff). Physician's time may include the following activities when performed: Preparing to see the patient (e.g., pre-charting review of records, searching for previously ordered imaging, lab work, and nerve conduction tests) Review of prior analgesic pharmacotherapies. Reviewing PMP Interpreting ordered tests (e.g., lab work, imaging, nerve conduction tests) Performing post-procedure evaluations, including interpretation of diagnostic procedures Obtaining and/or reviewing separately obtained history Performing a medically appropriate examination and/or evaluation Counseling and educating the patient/family/caregiver Ordering medications, tests, or procedures Referring and communicating with other health care professionals (when not separately reported) Documenting clinical information in the electronic or other health record Independently interpreting results (not separately reported) and communicating results to the patient/ family/caregiver Care coordination (not separately reported)  Note by: Oswaldo Done, MD Date: 09/04/2023; Time: 7:39 AM

## 2023-09-04 ENCOUNTER — Ambulatory Visit: Attending: Pain Medicine | Admitting: Pain Medicine

## 2023-09-04 ENCOUNTER — Encounter: Payer: Self-pay | Admitting: Pain Medicine

## 2023-09-04 VITALS — BP 130/75 | HR 74 | Temp 97.2°F | Resp 16 | Ht 61.0 in | Wt 187.0 lb

## 2023-09-04 DIAGNOSIS — M5417 Radiculopathy, lumbosacral region: Secondary | ICD-10-CM | POA: Insufficient documentation

## 2023-09-04 DIAGNOSIS — R937 Abnormal findings on diagnostic imaging of other parts of musculoskeletal system: Secondary | ICD-10-CM | POA: Diagnosis present

## 2023-09-04 DIAGNOSIS — M5441 Lumbago with sciatica, right side: Secondary | ICD-10-CM | POA: Diagnosis present

## 2023-09-04 DIAGNOSIS — M51372 Other intervertebral disc degeneration, lumbosacral region with discogenic back pain and lower extremity pain: Secondary | ICD-10-CM | POA: Diagnosis present

## 2023-09-04 DIAGNOSIS — M48061 Spinal stenosis, lumbar region without neurogenic claudication: Secondary | ICD-10-CM | POA: Insufficient documentation

## 2023-09-04 DIAGNOSIS — M461 Sacroiliitis, not elsewhere classified: Secondary | ICD-10-CM

## 2023-09-04 DIAGNOSIS — M545 Low back pain, unspecified: Secondary | ICD-10-CM | POA: Diagnosis present

## 2023-09-04 DIAGNOSIS — G8929 Other chronic pain: Secondary | ICD-10-CM | POA: Diagnosis present

## 2023-09-04 DIAGNOSIS — M47816 Spondylosis without myelopathy or radiculopathy, lumbar region: Secondary | ICD-10-CM | POA: Diagnosis present

## 2023-09-04 DIAGNOSIS — Z09 Encounter for follow-up examination after completed treatment for conditions other than malignant neoplasm: Secondary | ICD-10-CM | POA: Diagnosis present

## 2023-09-04 DIAGNOSIS — M79604 Pain in right leg: Secondary | ICD-10-CM | POA: Insufficient documentation

## 2023-09-04 NOTE — Patient Instructions (Signed)

## 2023-09-04 NOTE — Progress Notes (Signed)
 Safety precautions to be maintained throughout the outpatient stay will include: orient to surroundings, keep bed in low position, maintain call bell within reach at all times, provide assistance with transfer out of bed and ambulation.

## 2023-09-19 ENCOUNTER — Emergency Department
Admission: EM | Admit: 2023-09-19 | Discharge: 2023-09-20 | Disposition: A | Discharge status: Home / Self Care | Attending: Emergency Medicine | Admitting: Emergency Medicine

## 2023-09-19 ENCOUNTER — Emergency Department

## 2023-09-19 ENCOUNTER — Other Ambulatory Visit: Payer: Self-pay

## 2023-09-19 DIAGNOSIS — R059 Cough, unspecified: Secondary | ICD-10-CM | POA: Diagnosis not present

## 2023-09-19 DIAGNOSIS — R0602 Shortness of breath: Secondary | ICD-10-CM | POA: Insufficient documentation

## 2023-09-19 DIAGNOSIS — R0981 Nasal congestion: Secondary | ICD-10-CM | POA: Insufficient documentation

## 2023-09-19 DIAGNOSIS — Z955 Presence of coronary angioplasty implant and graft: Secondary | ICD-10-CM | POA: Insufficient documentation

## 2023-09-19 DIAGNOSIS — R4020 Unspecified coma: Secondary | ICD-10-CM

## 2023-09-19 DIAGNOSIS — E119 Type 2 diabetes mellitus without complications: Secondary | ICD-10-CM | POA: Diagnosis not present

## 2023-09-19 DIAGNOSIS — I129 Hypertensive chronic kidney disease with stage 1 through stage 4 chronic kidney disease, or unspecified chronic kidney disease: Secondary | ICD-10-CM | POA: Diagnosis not present

## 2023-09-19 DIAGNOSIS — R110 Nausea: Secondary | ICD-10-CM | POA: Insufficient documentation

## 2023-09-19 DIAGNOSIS — I2510 Atherosclerotic heart disease of native coronary artery without angina pectoris: Secondary | ICD-10-CM | POA: Diagnosis not present

## 2023-09-19 DIAGNOSIS — R55 Syncope and collapse: Secondary | ICD-10-CM | POA: Insufficient documentation

## 2023-09-19 DIAGNOSIS — N183 Chronic kidney disease, stage 3 unspecified: Secondary | ICD-10-CM | POA: Insufficient documentation

## 2023-09-19 DIAGNOSIS — R42 Dizziness and giddiness: Secondary | ICD-10-CM | POA: Insufficient documentation

## 2023-09-19 LAB — BASIC METABOLIC PANEL WITH GFR
Anion gap: 8 (ref 5–15)
BUN: 13 mg/dL (ref 8–23)
CO2: 23 mmol/L (ref 22–32)
Calcium: 8.8 mg/dL — ABNORMAL LOW (ref 8.9–10.3)
Chloride: 103 mmol/L (ref 98–111)
Creatinine, Ser: 0.93 mg/dL (ref 0.44–1.00)
GFR, Estimated: >60 mL/min (ref >60)
Glucose, Bld: 210 mg/dL — ABNORMAL HIGH (ref 70–99)
Potassium: 3.7 mmol/L (ref 3.5–5.1)
Sodium: 134 mmol/L — ABNORMAL LOW (ref 135–145)

## 2023-09-19 LAB — TROPONIN I (HIGH SENSITIVITY): Troponin I (High Sensitivity): 6 ng/L (ref <18)

## 2023-09-19 LAB — CBC
HCT: 40.3 % (ref 36.0–46.0)
Hemoglobin: 13.1 g/dL (ref 12.0–15.0)
MCH: 28.7 pg (ref 26.0–34.0)
MCHC: 32.5 g/dL (ref 30.0–36.0)
MCV: 88.4 fL (ref 80.0–100.0)
Platelets: 163 10*3/uL (ref 150–400)
RBC: 4.56 MIL/uL (ref 3.87–5.11)
RDW: 13.8 % (ref 11.5–15.5)
WBC: 5.4 10*3/uL (ref 4.0–10.5)
nRBC: 0 % (ref 0.0–0.2)

## 2023-09-19 LAB — RESP PANEL BY RT-PCR (RSV, FLU A&B, COVID)  RVPGX2
Influenza A by PCR: NEGATIVE
Influenza B by PCR: NEGATIVE
Resp Syncytial Virus by PCR: NEGATIVE
SARS Coronavirus 2 by RT PCR: NEGATIVE

## 2023-09-19 LAB — CBG MONITORING, ED: Glucose-Capillary: 190 mg/dL — ABNORMAL HIGH (ref 70–99)

## 2023-09-19 NOTE — ED Triage Notes (Signed)
 Pt BIB EMS with c/o syncope that happened at [facility] [facility]. Pt endorses lightheadedness, nausea, cough, and congestion. Pt recevied 4mg  of zofran enroute. Pt denies hitting her head.

## 2023-09-19 NOTE — ED Provider Notes (Signed)
 [facility] Provider Note    Event Date/Time   First MD Initiated Contact with Patient 09/19/23 2322     (approximate)   History   Loss of Consciousness   HPI Brandi [name] is a 88 y.o. female with history of CAD s/p CABG, DM2, CKD stage III, HTN, chronic pain presenting today for syncope.  Patient was reportedly at church earlier in the evening where she had a syncopal episode that was preceded by lightheadedness and nausea.  When she awoke she had some nausea as well at that time.  She also reports over the past couple of days she has had intermittent cough and congestion which is mildly productive.  Slight shortness of breath.  Currently she feels asymptomatic at this time with no lightheadedness.  Denies any point having chest pain, palpitations, vomiting, sweating.  Has prior history of intermittent lightheaded and syncopal episodes and previously been evaluated for this.  Chart review: Patient has recent echo on 05/11/2023 with EF 55 to 60%.  No evidence of aortic stenosis.  No obvious significant severe valvular regurgitation.     Physical Exam   Triage Vital Signs: ED Triage Vitals  Encounter Vitals Group     BP 09/19/23 2148 (!) 152/89     Systolic BP Percentile --      Diastolic BP Percentile --      Pulse Rate 09/19/23 2148 78     Resp 09/19/23 2148 19     Temp 09/19/23 2148 (!) 97.5 F (36.4 C)     Temp Source 09/19/23 2148 Oral     SpO2 09/19/23 2148 98 %     Weight 09/19/23 2145 187 lb (84.8 kg)     Height --      Head Circumference --      Peak Flow --      Pain Score 09/19/23 2145 0     Pain Loc --      Pain Education --      Exclude from Growth Chart --     Most recent vital signs: Vitals:   09/19/23 2148 09/20/23 0012  BP: (!) 152/89 (!) 239/107  Pulse: 78 82  Resp: 19 17  Temp: (!) 97.5 F (36.4 C)   SpO2: 98% 98%   Physical Exam: I have reviewed the vital signs and nursing notes. General: Awake, alert, no acute  distress.  Nontoxic appearing. Head:  Atraumatic, normocephalic.   ENT:  EOM intact, PERRL. Oral mucosa is pink and moist with no lesions. Neck: Neck is supple with full range of motion, No meningeal signs. Cardiovascular:  RRR, No murmurs. Peripheral pulses palpable and equal bilaterally. Respiratory:  Symmetrical chest wall expansion.  No rhonchi, rales, or wheezes.  Good air movement throughout.  No use of accessory muscles.   Musculoskeletal:  No cyanosis or edema. Moving extremities with full ROM Abdomen:  Soft, nontender, nondistended. Neuro:  GCS 15, moving all four extremities, interacting appropriately. Speech clear. Psych:  Calm, appropriate.   Skin:  Warm, dry, no rash.    ED Results / Procedures / Treatments   Labs (all labs ordered are listed, but only abnormal results are displayed) Labs Reviewed  BASIC METABOLIC PANEL WITH GFR - Abnormal; Notable for the following components:      Result Value   Sodium 134 (*)    Glucose, Bld 210 (*)    Calcium 8.8 (*)    All other components within normal limits  CBG MONITORING, ED - Abnormal; Notable  for the following components:   Glucose-Capillary 190 (*)    All other components within normal limits  RESP PANEL BY RT-PCR (RSV, FLU A&B, COVID)  RVPGX2  CBC  URINALYSIS, ROUTINE W REFLEX MICROSCOPIC  TROPONIN I (HIGH SENSITIVITY)     EKG My EKG interpretation: Rate of 77, normal sinus rhythm, normal axis, normal intervals.  No acute ST elevations or depressions   RADIOLOGY Independently interpreted chest x-ray with no acute pathology   PROCEDURES:  Critical Care performed: No  Procedures   MEDICATIONS ORDERED IN ED: Medications  hydrALAZINE (APRESOLINE) tablet 10 mg (has no administration in time range)  carvedilol (COREG) tablet 25 mg (has no administration in time range)     IMPRESSION / MDM / ASSESSMENT AND PLAN / ED COURSE  I reviewed the triage vital signs and the nursing notes.                               Differential diagnosis includes, but is not limited to, vasovagal syncope, dehydration, orthostatic hypotension, cardiac arrhythmia, electrolyte abnormality, pneumonia, COVID, flu, RSV  Patient's presentation is most consistent with acute complicated illness / injury requiring diagnostic workup.  Patient is an 88 year old female presenting today for syncopal episode with prodromal symptoms including lightheadedness and nausea while at church.  On arrival here she is completely asymptomatic with stable vital signs.  Physical exam largely unremarkable with no acute findings.  EKG does appear consistent with her baseline and no ischemic findings.  CBC and BMP reassuring.  Troponin negative.  Negative for COVID, flu, RSV.  Chest x-ray with no acute pathology.  Patient was able to ambulate around the ED without any concerns and feels asymptomatic at this time.  [place] syncope risk score of -2 placing her in the very low risk category.  Also had recent echocardiogram which was reassuring.  Otherwise safe for discharge and follow-up with PCP.  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  Will give one-time dose of her home medication before discharge and have her recheck blood pressure in the morning.  Patient agreeable with plan given strict return precautions.  The patient is on the cardiac monitor to evaluate for evidence of arrhythmia and/or significant heart rate changes. Clinical Course as of 09/20/23 0025  Wed Sep 19, 2023  2351 [place] syncope risk score of -2 placing her in the very low risk category.  Also had a recent echo which was reassuring. [DW]    Clinical Course User Index [DW] Janith Lima, MD     FINAL CLINICAL IMPRESSION(S) / ED DIAGNOSES   Final diagnoses:  Loss of consciousness (HCC)     Rx / DC Orders   ED Discharge Orders     None        Note:  This document was prepared using  Dragon voice recognition software and may include unintentional dictation errors.   Janith Lima, MD 09/20/23 [phone]

## 2023-09-20 ENCOUNTER — Ambulatory Visit: Admitting: Pain Medicine

## 2023-09-20 MED ORDER — CARVEDILOL 6.25 MG PO TABS
25.0000 mg | ORAL_TABLET | Freq: Once | ORAL | Status: AC
  Administered 2023-09-20: 25 mg via ORAL
  Filled 2023-09-20: qty 4

## 2023-09-20 MED ORDER — HYDRALAZINE HCL 10 MG PO TABS
10.0000 mg | ORAL_TABLET | Freq: Once | ORAL | Status: AC
  Administered 2023-09-20: 10 mg via ORAL
  Filled 2023-09-20: qty 1

## 2023-09-20 NOTE — Patient Instructions (Signed)

## 2023-09-20 NOTE — Discharge Instructions (Signed)
 Your workup today was reassuring.  EKG with the rhythm of your heart showed no acute abnormalities.  The rest of your blood work was also reassuring with no concerns from your heart.  Negative for COVID, flu, RSV.  Chest x-ray with no sign of pneumonia.  Please follow-up with your primary care provider as needed and return to the emergency department for any worsening symptoms.

## 2023-10-02 ENCOUNTER — Ambulatory Visit: Admitting: Pain Medicine

## 2023-10-11 ENCOUNTER — Ambulatory Visit: Admitting: Pain Medicine

## 2023-10-16 ENCOUNTER — Ambulatory Visit: Attending: Pain Medicine | Admitting: Pain Medicine

## 2023-10-16 ENCOUNTER — Ambulatory Visit
Admission: RE | Admit: 2023-10-16 | Discharge: 2023-10-16 | Disposition: A | Discharge status: Home / Self Care | Source: Ambulatory Visit | Attending: Pain Medicine | Admitting: Pain Medicine

## 2023-10-16 ENCOUNTER — Encounter: Payer: Self-pay | Admitting: Pain Medicine

## 2023-10-16 VITALS — BP 152/76 | HR 93 | Temp 97.6°F | Resp 16 | Ht 61.0 in | Wt 190.0 lb

## 2023-10-16 DIAGNOSIS — Z5189 Encounter for other specified aftercare: Secondary | ICD-10-CM

## 2023-10-16 DIAGNOSIS — M545 Low back pain, unspecified: Secondary | ICD-10-CM | POA: Insufficient documentation

## 2023-10-16 DIAGNOSIS — M5417 Radiculopathy, lumbosacral region: Secondary | ICD-10-CM

## 2023-10-16 DIAGNOSIS — M79604 Pain in right leg: Secondary | ICD-10-CM | POA: Diagnosis present

## 2023-10-16 DIAGNOSIS — R937 Abnormal findings on diagnostic imaging of other parts of musculoskeletal system: Secondary | ICD-10-CM | POA: Diagnosis present

## 2023-10-16 DIAGNOSIS — G8929 Other chronic pain: Secondary | ICD-10-CM

## 2023-10-16 DIAGNOSIS — M51372 Other intervertebral disc degeneration, lumbosacral region with discogenic back pain and lower extremity pain: Secondary | ICD-10-CM | POA: Diagnosis present

## 2023-10-16 DIAGNOSIS — M4317 Spondylolisthesis, lumbosacral region: Secondary | ICD-10-CM

## 2023-10-16 DIAGNOSIS — M5441 Lumbago with sciatica, right side: Secondary | ICD-10-CM | POA: Insufficient documentation

## 2023-10-16 MED ORDER — TRIAMCINOLONE ACETONIDE 40 MG/ML IJ SUSP
40.0000 mg | Freq: Once | INTRAMUSCULAR | Status: AC
  Administered 2023-10-16: 40 mg

## 2023-10-16 MED ORDER — SODIUM CHLORIDE 0.9% FLUSH
2.0000 mL | Freq: Once | INTRAVENOUS | Status: AC
  Administered 2023-10-16: 2 mL

## 2023-10-16 MED ORDER — MIDAZOLAM HCL 2 MG/2ML IJ SOLN
INTRAMUSCULAR | Status: AC
  Filled 2023-10-16: qty 2

## 2023-10-16 MED ORDER — LIDOCAINE HCL 2 % IJ SOLN
20.0000 mL | Freq: Once | INTRAMUSCULAR | Status: AC
Start: 2023-10-16 — End: 2023-10-16
  Administered 2023-10-16: 400 mg

## 2023-10-16 MED ORDER — LIDOCAINE HCL (PF) 2 % IJ SOLN
INTRAMUSCULAR | Status: AC
  Filled 2023-10-16: qty 5

## 2023-10-16 MED ORDER — IOHEXOL 180 MG/ML  SOLN
10.0000 mL | Freq: Once | INTRAMUSCULAR | Status: AC
Start: 2023-10-16 — End: 2023-10-16
  Administered 2023-10-16: 10 mL via EPIDURAL

## 2023-10-16 MED ORDER — PENTAFLUOROPROP-TETRAFLUOROETH EX AERO
INHALATION_SPRAY | Freq: Once | CUTANEOUS | Status: AC
  Administered 2023-10-16: 60 via TOPICAL

## 2023-10-16 MED ORDER — MIDAZOLAM HCL 2 MG/2ML IJ SOLN
0.5000 mg | Freq: Once | INTRAMUSCULAR | Status: AC
Start: 2023-10-16 — End: 2023-10-16
  Administered 2023-10-16 (×2): 1 mg via INTRAVENOUS

## 2023-10-16 MED ORDER — IOHEXOL 180 MG/ML  SOLN
INTRAMUSCULAR | Status: AC
  Filled 2023-10-16: qty 20

## 2023-10-16 MED ORDER — ROPIVACAINE HCL 2 MG/ML IJ SOLN
INTRAMUSCULAR | Status: AC
Start: 2023-10-16 — End: ?
  Filled 2023-10-16: qty 20

## 2023-10-16 MED ORDER — ROPIVACAINE HCL 2 MG/ML IJ SOLN
2.0000 mL | Freq: Once | INTRAMUSCULAR | Status: AC
  Administered 2023-10-16: 2 mL via EPIDURAL

## 2023-10-16 MED ORDER — TRIAMCINOLONE ACETONIDE 40 MG/ML IJ SUSP
INTRAMUSCULAR | Status: AC
  Filled 2023-10-16: qty 1

## 2023-10-16 MED ORDER — SODIUM CHLORIDE (PF) 0.9 % IJ SOLN
INTRAMUSCULAR | Status: AC
  Filled 2023-10-16: qty 10

## 2023-10-16 NOTE — Progress Notes (Signed)
 PROVIDER NOTE: Interpretation of information contained herein should be left to medically-trained personnel. Specific patient instructions are provided elsewhere under "Patient Instructions" section of medical record. This document was created in part using STT-dictation technology, any transcriptional errors that may result from this process are unintentional.  Patient: Brandi [name] Type: Established DOB: 05-May-1936 MRN: [id number] PCP: Little Riff, MD  Service: Procedure DOS: 10/16/2023 Setting: Ambulatory Location: Ambulatory outpatient facility Delivery: Face-to-face Provider: Candi Chafe, MD Specialty: Interventional Pain Management Specialty designation: 09 Location: Outpatient facility Ref. Prov.: Little Riff, MD       Interventional Therapy   Type: Lumbar epidural steroid injection (LESI) (interlaminar) #1    Laterality: Right   Level:  L4-5 Level.  Imaging: Fluoroscopic guidance Spinal ([id number]) Anesthesia: Local anesthesia (1-2% Lidocaine ) Anxiolysis: IV Versed  2.0 mg Sedation: No Sedation                       DOS: 10/16/2023  Performed by: Candi Chafe, MD  Purpose: Diagnostic/Therapeutic Indications: Lumbar radicular pain of intraspinal etiology of more than 4 weeks that has failed to respond to conservative therapy and is severe enough to impact quality of life or function. 1. Chronic low back pain (1ry area of Pain) (Bilateral) (R>L) w/ sciatica (Right)   2. DDD, lumbosacral w/ LBP & LEP   3. Chronic lower extremity pain (2ry area of Pain) (Right)   4. Grade 1 Anterolisthesis of lumbosacral spine of L3/L4 and L5/S1   5. Low back pain radiating to leg (Right)   6. Lumbosacral radiculopathy at L5 (Right)   7. Lumbosacral radiculopathy at S1 (Right)   8. Abnormal MRI, lumbar spine (05/09/2023)    NAS-11 Pain score:   Pre-procedure: 4 /10   Post-procedure: 4 /10   Note: The patient recently completed a round of oral steroids which she has  significantly improved her pain to the point where it is no longer constant.  The pain seems to be now intermittent and related to movement.  It still goes down the leg and the patient wants to proceed with today's treatment.  She also completed a round of antibiotics for a UTI.  She is currently having no UTI symptoms.    Position / Prep / Materials:  Position: Prone w/ head of the table raised (slight reverse trendelenburg) to facilitate breathing.  Prep solution: ChloraPrep (2% chlorhexidine  gluconate and 70% isopropyl alcohol ) Prep Area: Entire Posterior Lumbar Region from lower scapular tip down to mid buttocks area and from flank to flank. Materials:  Tray: Epidural tray Needle(s):  Type: Epidural needle (Tuohy) Gauge (G):  17 Length: Regular (3.5-in) Qty: 1  H&P (Pre-op Assessment):  Brandi [name] is a 88 y.o. (year old), female patient, seen today for interventional treatment. She  has a past surgical history that includes Cholecystectomy; Total knee arthroplasty (Bilateral); Septoplasty; Bunionectomy; LOOP RECORDER INSERTION (N/A, 09/20/2016); Colonoscopy with propofol  (N/A, 06/14/2017); Breast biopsy (Right, 2004); LEFT HEART CATH AND CORONARY ANGIOGRAPHY (N/A, 05/22/2018); XI Robotic assisted inguinal hernia repair with mesh (Left, 10/14/2020); Coronary artery bypass graft (N/A, 05/29/2018); LEFT ATRIAL APPENDAGE OCCLUSION (Left, 05/29/2018); Coronary angioplasty with stent (Left, 08/05/2010); LEFT HEART CATH AND CORONARY ANGIOGRAPHY (Left, 07/03/2011); LEFT HEART CATH AND CORONARY ANGIOGRAPHY (Left, 05/22/2018); and Hysteroscopy with D & C (N/A, 12/14/2022). Brandi [name] has a current medication list which includes the following prescription(s): acetaminophen , aspirin  ec, atorvastatin , budesonide-formoterol , calcium  carb-cholecalciferol, carboxymethylcellulose, carvedilol , cyanocobalamin , famotidine , ferrous sulfate, gabapentin , glipizide , hydralazine , lantus  solostar, loratadine , losartan ,  meclizine , melatonin, mirabegron  er, preservision/lutein , nitroglycerin , progesterone, sodium chloride , and verapamil  hcl cr. Her primarily concern today is the Back Pain  Initial Vital Signs:  Pulse/HCG Rate: 93ECG Heart Rate: 80 Temp: 97.6 F (36.4 C) Resp: 16 BP: (!) 157/84 SpO2: 99 %  BMI: Estimated body mass index is 35.9 kg/m as calculated from the following:   Height as of this encounter: 5\' 1"  (1.549 m).   Weight as of this encounter: 190 lb (86.2 kg).  Risk Assessment: Allergies: Reviewed. She is allergic to accupril [quinapril hcl], micardis [telmisartan], and quinapril.  Allergy Precautions: None required Coagulopathies: Reviewed. None identified.  Blood-thinner therapy: None at this time Active Infection(s): Reviewed. None identified. Brandi [name] is afebrile  Site Confirmation: Brandi [name] was asked to confirm the procedure and laterality before marking the site Procedure checklist: Completed Consent: Before the procedure and under the influence of no sedative(s), amnesic(s), or anxiolytics, the patient was informed of the treatment options, risks and possible complications. To fulfill our ethical and legal obligations, as recommended by the American Medical Association's Code of Ethics, I have informed the patient of my clinical impression; the nature and purpose of the treatment or procedure; the risks, benefits, and possible complications of the intervention; the alternatives, including doing nothing; the risk(s) and benefit(s) of the alternative treatment(s) or procedure(s); and the risk(s) and benefit(s) of doing nothing. The patient was provided information about the general risks and possible complications associated with the procedure. These may include, but are not limited to: failure to achieve desired goals, infection, bleeding, organ or nerve damage, allergic reactions, paralysis, and death. In addition, the patient was informed of those risks and complications  associated to Spine-related procedures, such as failure to decrease pain; infection (i.e.: Meningitis, epidural or intraspinal abscess); bleeding (i.e.: epidural hematoma, subarachnoid hemorrhage, or any other type of intraspinal or peri-dural bleeding); organ or nerve damage (i.e.: Any type of peripheral nerve, nerve root, or spinal cord injury) with subsequent damage to sensory, motor, and/or autonomic systems, resulting in permanent pain, numbness, and/or weakness of one or several areas of the body; allergic reactions; (i.e.: anaphylactic reaction); and/or death. Furthermore, the patient was informed of those risks and complications associated with the medications. These include, but are not limited to: allergic reactions (i.e.: anaphylactic or anaphylactoid reaction(s)); adrenal axis suppression; blood sugar elevation that in diabetics may result in ketoacidosis or comma; water retention that in patients with history of congestive heart failure may result in shortness of breath, pulmonary edema, and decompensation with resultant heart failure; weight gain; swelling or edema; medication-induced neural toxicity; particulate matter embolism and blood vessel occlusion with resultant organ, and/or nervous system infarction; and/or aseptic necrosis of one or more joints. Finally, the patient was informed that Medicine is not an exact science; therefore, there is also the possibility of unforeseen or unpredictable risks and/or possible complications that may result in a catastrophic outcome. The patient indicated having understood very clearly. We have given the patient no guarantees and we have made no promises. Enough time was given to the patient to ask questions, all of which were answered to the patient's satisfaction. Ms. Rothfus has indicated that she wanted to continue with the procedure. Attestation: I, the ordering provider, attest that I have discussed with the patient the benefits, risks, side-effects,  alternatives, likelihood of achieving goals, and potential problems during recovery for the procedure that I have provided informed consent. Date  Time: 10/16/2023  8:13 AM  Pre-Procedure Preparation:  Monitoring: As per clinic  protocol. Respiration, ETCO2, SpO2, BP, heart rate and rhythm monitor placed and checked for adequate function Safety Precautions: Patient was assessed for positional comfort and pressure points before starting the procedure. Time-out: I initiated and conducted the "Time-out" before starting the procedure, as per protocol. The patient was asked to participate by confirming the accuracy of the "Time Out" information. Verification of the correct person, site, and procedure were performed and confirmed by me, the nursing staff, and the patient. "Time-out" conducted as per Joint Commission's Universal Protocol (UP.01.01.01). Time: 0844 Start Time: 0844 hrs.  Description/Narrative of Procedure:          Target: Epidural space via interlaminar opening, initially targeting the lower laminar border of the superior vertebral body. Region: Lumbar Approach: Percutaneous paravertebral  Rationale (medical necessity): procedure needed and proper for the diagnosis and/or treatment of the patient's medical symptoms and needs. Procedural Technique Safety Precautions: Aspiration looking for blood return was conducted prior to all injections. At no point did we inject any substances, as a needle was being advanced. No attempts were made at seeking any paresthesias. Safe injection practices and needle disposal techniques used. Medications properly checked for expiration dates. SDV (single dose vial) medications used. Description of the Procedure: Protocol guidelines were followed. The procedure needle was introduced through the skin, ipsilateral to the reported pain, and advanced to the target area. Bone was contacted and the needle walked caudad, until the lamina was cleared. The epidural space  was identified using "loss-of-resistance technique" with 2-3 ml of PF-NaCl (0.9% NSS), in a 5cc LOR glass syringe.  Vitals:   10/16/23 0843 10/16/23 0853 10/16/23 0855 10/16/23 0900  BP: (!) 201/106 (!) 178/108 (!) (P) 172/106 (!) 152/76  Pulse:      Resp: 18 16 (P) 15 16  Temp:      SpO2: 99% 99% (P) 99% 96%  Weight:      Height:        Start Time: 0844 hrs. End Time: 0844 hrs.  Imaging Guidance (Spinal):          Type of Imaging Technique: Fluoroscopy Guidance (Spinal) Indication(s): Fluoroscopy guidance for needle placement to enhance accuracy in procedures requiring precise needle localization for targeted delivery of medication in or near specific anatomical locations not easily accessible without such real-time imaging assistance. Exposure Time: Please see nurses notes. Contrast: Before injecting any contrast, we confirmed that the patient did not have an allergy to iodine , shellfish, or radiological contrast. Once satisfactory needle placement was completed at the desired level, radiological contrast was injected. Contrast injected under live fluoroscopy. No contrast complications. See chart for type and volume of contrast used. Fluoroscopic Guidance: I was personally present during the use of fluoroscopy. "Tunnel Vision Technique" used to obtain the best possible view of the target area. Parallax error corrected before commencing the procedure. "Direction-depth-direction" technique used to introduce the needle under continuous pulsed fluoroscopy. Once target was reached, antero-posterior, oblique, and lateral fluoroscopic projection used confirm needle placement in all planes. Images permanently stored in EMR. Interpretation: I personally interpreted the imaging intraoperatively. Adequate needle placement confirmed in multiple planes. Appropriate spread of contrast into desired area was observed. No evidence of afferent or efferent intravascular uptake. No intrathecal or subarachnoid  spread observed. Permanent images saved into the patient's record.  Antibiotic Prophylaxis:   Anti-infectives (From admission, onward)    None      Indication(s): None identified  Post-operative Assessment:  Post-procedure Vital Signs:  Pulse/HCG Rate: 9396 Temp: 97.6 F (36.4 C)  Resp: 16 BP: (!) 152/76 SpO2: 96 %  EBL: None  Complications: No immediate post-treatment complications observed by team, or reported by patient.  Note: The patient tolerated the entire procedure well. A repeat set of vitals were taken after the procedure and the patient was kept under observation following institutional policy, for this type of procedure. Post-procedural neurological assessment was performed, showing return to baseline, prior to discharge. The patient was provided with post-procedure discharge instructions, including a section on how to identify potential problems. Should any problems arise concerning this procedure, the patient was given instructions to immediately contact us , at any time, without hesitation. In any case, we plan to contact the patient by telephone for a follow-up status report regarding this interventional procedure.  Comments:  No additional relevant information.  Plan of Care (POC)  Orders:  Orders Placed This Encounter  Procedures   Lumbar Epidural Injection    Scheduling Instructions:     Procedure: Interlaminar LESI L4-5     Laterality: Right     Sedation: Patient's choice     Timeframe: Today    Where will this procedure be performed?:   [facility]   [facility] C-ARM 1-60 MIN NO REPORT    Intraoperative interpretation by procedural physician at [facility].    Standing Status:   Standing    Number of Occurrences:   1    Reason for exam::   Assistance in needle guidance and placement for procedures requiring needle placement in or near specific anatomical locations not easily accessible without such assistance.   Informed Consent  Details: Physician/Practitioner Attestation; Transcribe to consent form and obtain patient signature    Note: Always confirm laterality of pain with Ms. Felipe Horton, before procedure. Transcribe to consent form and obtain patient signature.    Physician/Practitioner attestation of informed consent for procedure/surgical case:   I, the physician/practitioner, attest that I have discussed with the patient the benefits, risks, side effects, alternatives, likelihood of achieving goals and potential problems during recovery for the procedure that I have provided informed consent.    Procedure:   Lumbar epidural steroid injection under fluoroscopic guidance    Physician/Practitioner performing the procedure:   Daren Yeagle A. Barth Borne, MD    Indication/Reason:   Low back and/or lower extremity pain secondary to lumbar radiculitis   Provide equipment / supplies at bedside    Procedural tray: Epidural Tray (Disposable  single use) Skin infiltration needle: Regular 1.5-in, 25-G, (x1) Block needle size: Regular standard Catheter: No catheter required    Standing Status:   Standing    Number of Occurrences:   1    Specify:   Epidural Tray   Saline lock IV    Have LR [phone] mL available and administer at 125 mL/hr if patient becomes hypotensive.    Standing Status:   Standing    Number of Occurrences:   1   Chronic Opioid Analgesic:   No chronic opioid analgesics therapy prescribed by our practice. None MME/day: 0 mg/day    Medications ordered for procedure: Meds ordered this encounter  Medications   iohexol  (OMNIPAQUE ) 180 MG/ML injection 10 mL    Must be Myelogram-compatible. If not available, you may substitute with a water-soluble, non-ionic, hypoallergenic, myelogram-compatible radiological contrast medium.   lidocaine  (XYLOCAINE ) 2 % (with pres) injection 400 mg   pentafluoroprop-tetrafluoroeth (GEBAUERS) aerosol   midazolam  (VERSED ) injection 0.5-2 mg    Make sure Flumazenil is available in the  pyxis when using this medication. If  oversedation occurs, administer 0.2 mg IV over 15 sec. If after 45 sec no response, administer 0.2 mg again over 1 min; may repeat at 1 min intervals; not to exceed 4 doses (1 mg)   sodium chloride  flush (NS) 0.9 % injection 2 mL   ropivacaine  (PF) 2 mg/mL (0.2%) (NAROPIN ) injection 2 mL   triamcinolone acetonide (KENALOG-40) injection 40 mg   Medications administered: We administered iohexol , lidocaine , pentafluoroprop-tetrafluoroeth, midazolam , sodium chloride  flush, ropivacaine  (PF) 2 mg/mL (0.2%), and triamcinolone acetonide.  See the medical record for exact dosing, route, and time of administration.  Follow-up plan:   Return in about 2 weeks (around 10/30/2023) for (Face2F), (PPE).       Interventional Therapies  Risk Factors  Considerations  Medical Comorbidities:  T2IDDM  MO (BMI>35)  CAD s/p CABG x 3  Hx A-Fib/Flutter  Hx. Orthostatic Hypotension  Stage 3 CKD  GERD  HTN  Cystocele Grade 3  Incontinence     Planned  Pending:   Diagnostic right sacroiliac joint block #1 (Requested by referring neurosurgeon Dr. Errol Heaps Abd-El-Barr )   Under consideration:   Diagnostic right sacroiliac joint block #1  Diagnostic right lumbar facet MBB #1    Completed:   None at this time   Therapeutic  Palliative (PRN) options:   None established   Completed by other providers:   None reported      Recent Visits Date Type Provider Dept  09/04/23 Office Visit Renaldo Caroli, MD [facility]  08/21/23 Procedure visit Renaldo Caroli, MD [facility]  08/15/23 Office Visit Renaldo Caroli, MD [facility]  Showing recent visits within past 90 days and meeting all other requirements Today's Visits Date Type Provider Dept  10/16/23 Procedure visit Renaldo Caroli, MD [facility]  Showing today's visits and meeting all other requirements Future Appointments Date Type Provider Dept   10/30/23 Appointment Renaldo Caroli, MD [facility]  Showing future appointments within next 90 days and meeting all other requirements  Disposition: Discharge home  Discharge (Date  Time): 10/16/2023; 0911 hrs.   Primary Care Physician: Little Riff, MD Location: [facility] Note by: Candi Chafe, MD (TTS technology used. I apologize for any typographical errors that were not detected and corrected.) Date: 10/16/2023; Time: 9:25 AM  Disclaimer:  Medicine is not an Visual merchandiser. The only guarantee in medicine is that nothing is guaranteed. It is important to note that the decision to proceed with this intervention was based on the information collected from the patient. The Data and conclusions were drawn from the patient's questionnaire, the interview, and the physical examination. Because the information was provided in large part by the patient, it cannot be guaranteed that it has not been purposely or unconsciously manipulated. Every effort has been made to obtain as much relevant data as possible for this evaluation. It is important to note that the conclusions that lead to this procedure are derived in large part from the available data. Always take into account that the treatment will also be dependent on availability of resources and existing treatment guidelines, considered by other Pain Management Practitioners as being common knowledge and practice, at the time of the intervention. For Medico-Legal purposes, it is also important to point out that variation in procedural techniques and pharmacological choices are the acceptable norm. The indications, contraindications, technique, and results of the above procedure should only be interpreted and judged by a Board-Certified Interventional Pain Specialist with extensive familiarity  and expertise in the same exact procedure and technique.

## 2023-10-16 NOTE — Patient Instructions (Signed)

## 2023-10-17 ENCOUNTER — Telehealth: Payer: Self-pay | Admitting: *Deleted

## 2023-10-17 NOTE — Telephone Encounter (Signed)
 No problems post procedure.

## 2023-10-27 ENCOUNTER — Other Ambulatory Visit: Payer: Self-pay

## 2023-10-27 ENCOUNTER — Emergency Department
Admission: EM | Admit: 2023-10-27 | Discharge: 2023-10-27 | Disposition: A | Discharge status: Home / Self Care | Attending: Emergency Medicine | Admitting: Emergency Medicine

## 2023-10-27 DIAGNOSIS — R197 Diarrhea, unspecified: Secondary | ICD-10-CM | POA: Diagnosis not present

## 2023-10-27 DIAGNOSIS — R110 Nausea: Secondary | ICD-10-CM | POA: Insufficient documentation

## 2023-10-27 DIAGNOSIS — I10 Essential (primary) hypertension: Secondary | ICD-10-CM | POA: Insufficient documentation

## 2023-10-27 DIAGNOSIS — Z955 Presence of coronary angioplasty implant and graft: Secondary | ICD-10-CM | POA: Insufficient documentation

## 2023-10-27 DIAGNOSIS — I2510 Atherosclerotic heart disease of native coronary artery without angina pectoris: Secondary | ICD-10-CM | POA: Diagnosis not present

## 2023-10-27 DIAGNOSIS — R109 Unspecified abdominal pain: Secondary | ICD-10-CM | POA: Diagnosis not present

## 2023-10-27 DIAGNOSIS — R55 Syncope and collapse: Secondary | ICD-10-CM | POA: Diagnosis present

## 2023-10-27 DIAGNOSIS — Z951 Presence of aortocoronary bypass graft: Secondary | ICD-10-CM | POA: Diagnosis not present

## 2023-10-27 DIAGNOSIS — E119 Type 2 diabetes mellitus without complications: Secondary | ICD-10-CM | POA: Insufficient documentation

## 2023-10-27 LAB — CBC WITH DIFFERENTIAL/PLATELET
Abs Immature Granulocytes: 0.04 10*3/uL (ref 0.00–0.07)
Basophils Absolute: 0 10*3/uL (ref 0.0–0.1)
Basophils Relative: 0 %
Eosinophils Absolute: 0.1 10*3/uL (ref 0.0–0.5)
Eosinophils Relative: 2 %
HCT: 36.6 % (ref 36.0–46.0)
Hemoglobin: 12 g/dL (ref 12.0–15.0)
Immature Granulocytes: 1 %
Lymphocytes Relative: 21 %
Lymphs Abs: 1.5 10*3/uL (ref 0.7–4.0)
MCH: 29.6 pg (ref 26.0–34.0)
MCHC: 32.8 g/dL (ref 30.0–36.0)
MCV: 90.4 fL (ref 80.0–100.0)
Monocytes Absolute: 0.6 10*3/uL (ref 0.1–1.0)
Monocytes Relative: 9 %
Neutro Abs: 5.1 10*3/uL (ref 1.7–7.7)
Neutrophils Relative %: 67 %
Platelets: 176 10*3/uL (ref 150–400)
RBC: 4.05 MIL/uL (ref 3.87–5.11)
RDW: 14.4 % (ref 11.5–15.5)
WBC: 7.4 10*3/uL (ref 4.0–10.5)
nRBC: 0 % (ref 0.0–0.2)

## 2023-10-27 LAB — COMPREHENSIVE METABOLIC PANEL WITH GFR
ALT: 19 U/L (ref 0–44)
AST: 14 U/L — ABNORMAL LOW (ref 15–41)
Albumin: 2.6 g/dL — ABNORMAL LOW (ref 3.5–5.0)
Alkaline Phosphatase: 55 U/L (ref 38–126)
Anion gap: 7 (ref 5–15)
BUN: 10 mg/dL (ref 8–23)
CO2: 23 mmol/L (ref 22–32)
Calcium: 8.5 mg/dL — ABNORMAL LOW (ref 8.9–10.3)
Chloride: 103 mmol/L (ref 98–111)
Creatinine, Ser: 0.84 mg/dL (ref 0.44–1.00)
GFR, Estimated: >60 mL/min (ref >60)
Glucose, Bld: 217 mg/dL — ABNORMAL HIGH (ref 70–99)
Potassium: 3.3 mmol/L — ABNORMAL LOW (ref 3.5–5.1)
Sodium: 133 mmol/L — ABNORMAL LOW (ref 135–145)
Total Bilirubin: 0.7 mg/dL (ref 0.0–1.2)
Total Protein: 5 g/dL — ABNORMAL LOW (ref 6.5–8.1)

## 2023-10-27 LAB — TROPONIN I (HIGH SENSITIVITY)
Troponin I (High Sensitivity): 7 ng/L (ref <18)
Troponin I (High Sensitivity): 8 ng/L (ref <18)

## 2023-10-27 LAB — URINALYSIS, ROUTINE W REFLEX MICROSCOPIC
Bilirubin Urine: NEGATIVE
Glucose, UA: NEGATIVE mg/dL
Hgb urine dipstick: NEGATIVE
Ketones, ur: NEGATIVE mg/dL
Leukocytes,Ua: NEGATIVE
Nitrite: NEGATIVE
Protein, ur: NEGATIVE mg/dL
Specific Gravity, Urine: 1.01 (ref 1.005–1.030)
pH: 6 (ref 5.0–8.0)

## 2023-10-27 LAB — LIPASE, BLOOD: Lipase: 22 U/L (ref 11–51)

## 2023-10-27 MED ORDER — SODIUM CHLORIDE 0.9 % IV BOLUS
500.0000 mL | Freq: Once | INTRAVENOUS | Status: AC
  Administered 2023-10-27: 500 mL via INTRAVENOUS

## 2023-10-27 NOTE — ED Triage Notes (Signed)
 Pt brought in from home by EMS for syncopal episode. Per pt, she's had diarrhea since 9am this morning; when getting up to go to the bathroom she had a syncopal episode. Per pt, she did fall; her son assisted her to her chair. Per EMS, initial BP 90/37. 500cc NS administered; BP increased to 155/72.

## 2023-10-27 NOTE — ED Notes (Signed)
 Basics, blue top and lactic sent down with save tube labels. Pt hooked up to cardiac monitor.

## 2023-10-27 NOTE — ED Notes (Signed)
 This RN attempted urine sample, Pt could not provide it at this time.

## 2023-10-27 NOTE — ED Provider Notes (Signed)
 [facility] Provider Note    Event Date/Time   First MD Initiated Contact with Patient 10/27/23 1117     (approximate)   History   Loss of Consciousness   HPI  Brandi [name] is a 88 y.o. female with history of recurrent vasovagal syncope, diabetes, paroxysmal A-fib, CAD status post CABG, LAD stent, PVD, spinal stenosis, OSA on CPAP, and recurrent UTIs who presents with syncope, acute onset today while the patient was on the toilet.  The patient states that she woke up today having diarrhea and had several episodes along with abdominal cramping.  While she was on the toilet she started to feel lightheaded and nauseous, and then lost consciousness per the husband.  She did not fall or hit her head.  She states that she is feeling much better now.  She has not had any vomiting.  The patient denies any chest pain or difficulty breathing.  She states that she has had vasovagal syncopal episodes before, but the last one was probably about a year ago.  I reviewed the past medical records.  The patient was most recent admitted to the hospitalist service in November of last year with a hypertensive emergency.   Physical Exam   Triage Vital Signs: ED Triage Vitals  Encounter Vitals Group     BP      Systolic BP Percentile      Diastolic BP Percentile      Pulse      Resp      Temp      Temp src      SpO2      Weight      Height      Head Circumference      Peak Flow      Pain Score      Pain Loc      Pain Education      Exclude from Growth Chart     Most recent vital signs: Vitals:   10/27/23 1322 10/27/23 1326  BP: (!) 196/83 117/80  Pulse: 77 81  Resp: 17 16  Temp:    SpO2: 100% 100%     General: Alert and oriented, no distress.  CV:  Good peripheral perfusion.  Resp:  Normal effort.  Lungs CTAB. Abd:  Soft and nontender.  No distention.  Other:  EOMI.  PERRLA.  No facial droop.  Normal speech.  Motor intact in all extremities.  Normal  coordination.  Dry mucous membranes.   ED Results / Procedures / Treatments   Labs (all labs ordered are listed, but only abnormal results are displayed) Labs Reviewed  COMPREHENSIVE METABOLIC PANEL WITH GFR - Abnormal; Notable for the following components:      Result Value   Sodium 133 (*)    Potassium 3.3 (*)    Glucose, Bld 217 (*)    Calcium  8.5 (*)    Total Protein 5.0 (*)    Albumin 2.6 (*)    AST 14 (*)    All other components within normal limits  URINALYSIS, ROUTINE W REFLEX MICROSCOPIC - Abnormal; Notable for the following components:   Color, Urine YELLOW (*)    APPearance CLEAR (*)    All other components within normal limits  CBC WITH DIFFERENTIAL/PLATELET  LIPASE, BLOOD  TROPONIN I (HIGH SENSITIVITY)  TROPONIN I (HIGH SENSITIVITY)     EKG  ED ECG REPORT I, Lind Repine, the attending physician, personally viewed and interpreted this ECG.  Date: 10/27/2023 EKG  Time: 1124 Rate: 72 Rhythm: normal sinus rhythm QRS Axis: normal Intervals: normal ST/T Wave abnormalities: Nonspecific T wave abnormalities Narrative Interpretation: no evidence of acute ischemia  ED ECG REPORT I, Lind Repine, the attending physician, personally viewed and interpreted this ECG.  Date: 10/27/2023 EKG Time: 1238 Rate: 71 Rhythm: normal sinus rhythm QRS Axis: normal Intervals: normal ST/T Wave abnormalities: normal Narrative Interpretation: no evidence of acute ischemia; no dynamic change when compared to EKG of 1124 today   RADIOLOGY    PROCEDURES:  Critical Care performed: No  Procedures   MEDICATIONS ORDERED IN ED: Medications  sodium chloride  0.9 % bolus 500 mL (0 mLs Intravenous Stopped 10/27/23 1213)     IMPRESSION / MDM / ASSESSMENT AND PLAN / ED COURSE  I reviewed the triage vital signs and the nursing notes.  88 year old female with PMH as noted above presents with a syncopal episode while she was on the toilet having diarrhea.  Her  symptoms have now resolved.  On exam her blood pressure is actually elevated.  Other vital signs are normal.  Neurologic exam is nonfocal.  The abdomen is soft and nontender.  Differential diagnosis includes, but is not limited to,  vasovagal syncope, dehydration/hypovolemia, electrolyte abnormality, hypoglycemia, other metabolic cause, less likely cardiac etiology.  There is no evidence of CNS cause.  We will obtain basic and cardiac labs, give a fluid bolus, and reassess.  Patient's presentation is most consistent with acute presentation with potential threat to life or bodily function.  The patient is on the cardiac monitor to evaluate for evidence of arrhythmia and/or significant heart rate changes.  ----------------------------------------- 2:25 PM on 10/27/2023 -----------------------------------------  Workup is reassuring.  CMP shows no concerning electrolyte abnormalities.  CBC is normal.  Troponins are negative x 2.  Urinalysis shows no evidence of UTI.  The patient was quite hypertensive.  Orthostatics were checked, and the blood pressure significantly, but only went down to a normal blood pressure and remained there.  The patient did not have any lightheadedness or near syncope when standing.  Therefore I consider the orthostatics to be negative.  I did consider whether the patient may benefit from inpatient admission given her age and comorbidities.  I offered this to the patient.  However, the patient expresses a strong preference to go home.  I feel that this is reasonable given the negative workup, stable vital signs, and the prior history of similar episodes.  I counseled the patient on the results of the workup.  I gave strict return precautions and she expressed understanding.   FINAL CLINICAL IMPRESSION(S) / ED DIAGNOSES   Final diagnoses:  Syncope, unspecified syncope type     Rx / DC Orders   ED Discharge Orders     None        Note:  This document was  prepared using Dragon voice recognition software and may include unintentional dictation errors.    Lind Repine, MD 10/27/23 1521

## 2023-10-27 NOTE — ED Notes (Signed)
 Fall precautions in place for Pt. This RN placed fall band, fall grip socks, bed alarm and fall sign.

## 2023-10-27 NOTE — Discharge Instructions (Signed)
 Take your medications as prescribed.  Make sure to drink plenty of fluids.  Follow-up with your primary care provider.  Return to the ER immediately for new, worsening, or recurrent episodes of feeling very lightheaded or passing out, or any other new or worsening symptoms that concern you.

## 2023-10-27 NOTE — ED Notes (Signed)
 Pt tells this RN that she had a epidural a few days ago and may have contributed to her syncopal episode.

## 2023-10-30 ENCOUNTER — Encounter: Payer: Self-pay | Admitting: Pain Medicine

## 2023-10-30 ENCOUNTER — Ambulatory Visit: Attending: Pain Medicine | Admitting: Pain Medicine

## 2023-10-30 VITALS — BP 155/78 | HR 88 | Temp 97.2°F | Resp 14 | Ht 61.0 in | Wt 180.0 lb

## 2023-10-30 DIAGNOSIS — Z09 Encounter for follow-up examination after completed treatment for conditions other than malignant neoplasm: Secondary | ICD-10-CM | POA: Diagnosis present

## 2023-10-30 DIAGNOSIS — M5459 Other low back pain: Secondary | ICD-10-CM | POA: Insufficient documentation

## 2023-10-30 DIAGNOSIS — M5417 Radiculopathy, lumbosacral region: Secondary | ICD-10-CM | POA: Insufficient documentation

## 2023-10-30 DIAGNOSIS — M79604 Pain in right leg: Secondary | ICD-10-CM | POA: Diagnosis present

## 2023-10-30 DIAGNOSIS — M545 Low back pain, unspecified: Secondary | ICD-10-CM | POA: Insufficient documentation

## 2023-10-30 DIAGNOSIS — M47816 Spondylosis without myelopathy or radiculopathy, lumbar region: Secondary | ICD-10-CM | POA: Insufficient documentation

## 2023-10-30 DIAGNOSIS — M5441 Lumbago with sciatica, right side: Secondary | ICD-10-CM | POA: Diagnosis present

## 2023-10-30 DIAGNOSIS — G8929 Other chronic pain: Secondary | ICD-10-CM | POA: Diagnosis present

## 2023-10-30 NOTE — Progress Notes (Signed)
 PROVIDER NOTE: Interpretation of information contained herein should be left to medically-trained personnel. Specific patient instructions are provided elsewhere under "Patient Instructions" section of medical record. This document was created in part using AI and STT-dictation technology, any transcriptional errors that may result from this process are unintentional.  Patient: Brandi [name]  Service: E/M   PCP: Little Riff, MD  DOB: 1936/02/20  DOS: 10/30/2023  Provider: Candi Chafe, MD  MRN: [id number]  Delivery: Face-to-face  Specialty: Interventional Pain Management  Type: Established Patient  Setting: Ambulatory outpatient facility  Specialty designation: 09  Referring Prov.: Little Riff, MD  Location: Outpatient office facility       HPI  Ms. Brandi [name], a 88 y.o. year old female, is here today because of her Chronic bilateral low back pain with right-sided sciatica [G89.29, M54.41]. Brandi [name] primary complain today is Back Pain (lower)  Pertinent problems: Brandi [name] has Headache disorder; Diabetic neuropathy (HCC); Abdominal pain; Sacroiliitis (HCC); Chronic pain syndrome; Abnormal CT scan, cervical spine (07/24/2021); Abnormal MRI, lumbar spine (05/09/2023); Chronic low back pain (1ry area of Pain) (Bilateral) (R>L) w/ sciatica (Right); Low back pain radiating to leg (Right); Pain of lateral upper thigh (Right) (Intermittent); Chronic sacroiliac joint pain (Right); Somatic dysfunction of sacroiliac joint (Right); Grade 1 Anterolisthesis of lumbosacral spine of L3/L4 and L5/S1; Lumbar facet joint pain; Chronic lower extremity pain (2ry area of Pain) (Right); Lumbosacral radiculopathy at L5 (Right); Lumbosacral radiculopathy at S1 (Right); DDD, lumbosacral w/ LBP & LEP; Lumbar facet hypertrophy (Multilevel) (Bilateral); Lumbar foraminal stenosis; Chronic low back pain (Right) w/o sciatica; and Lumbar facet joint syndrome on their pertinent problem list. Pain Assessment: Severity  of Chronic pain is reported as a 3 /10. Location: Back Lower/denies. Onset: More than a month ago. Quality: Aching, Burning, Nagging. Timing: Constant. Modifying factor(s): rest. Vitals:  height is 5\' 1"  (1.549 m) and weight is 180 lb (81.6 kg). Her temporal temperature is 97.2 F (36.2 C) (abnormal). Her blood pressure is 155/78 (abnormal) and her pulse is 88. Her respiration is 14 and oxygen saturation is 99%.  BMI: Estimated body mass index is 34.01 kg/m as calculated from the following:   Height as of this encounter: 5\' 1"  (1.549 m).   Weight as of this encounter: 180 lb (81.6 kg). Last encounter: 09/04/2023. Last procedure: 10/16/2023.  Reason for encounter: post-procedure evaluation and assessment.   Discussed the use of AI scribe software for clinical note transcription with the patient, who gave verbal consent to proceed.  History of Present Illness   Brandi [name] is an 88 year old female with degenerative spine disease who presents with persistent lower back pain.  She experiences persistent lower back pain, primarily on the right side, without radiation to her legs. This is a change from previous symptoms where she experienced leg pain.  She has a history of receiving epidural steroid injections, which alleviated her leg pain but did not address her current lower back pain.  Imaging studies have shown degenerative changes in the facet joints from L3-4 through L5-S1, with a grade one retrolisthesis of L4 over L5.  She is concerned about the use of steroids due to their impact on her blood sugar levels and vision, stating it affects her eyes. She prefers to avoid steroids in future treatments.      Post-procedure evaluation   Type: Lumbar epidural steroid injection (LESI) (interlaminar) #1    Laterality: Right   Level:  L4-5 Level.  Imaging: Fluoroscopic  guidance Spinal ([id number]) Anesthesia: Local anesthesia (1-2% Lidocaine ) Anxiolysis: IV Versed  2.0 mg Sedation: No Sedation                        DOS: 10/16/2023  Performed by: Candi Chafe, MD  Purpose: Diagnostic/Therapeutic Indications: Lumbar radicular pain of intraspinal etiology of more than 4 weeks that has failed to respond to conservative therapy and is severe enough to impact quality of life or function. 1. Chronic low back pain (1ry area of Pain) (Bilateral) (R>L) w/ sciatica (Right)   2. DDD, lumbosacral w/ LBP & LEP   3. Chronic lower extremity pain (2ry area of Pain) (Right)   4. Grade 1 Anterolisthesis of lumbosacral spine of L3/L4 and L5/S1   5. Low back pain radiating to leg (Right)   6. Lumbosacral radiculopathy at L5 (Right)   7. Lumbosacral radiculopathy at S1 (Right)   8. Abnormal MRI, lumbar spine (05/09/2023)    NAS-11 Pain score:   Pre-procedure: 4 /10   Post-procedure: 4 /10   Note: The patient recently completed a round of oral steroids which she has significantly improved her pain to the point where it is no longer constant.  The pain seems to be now intermittent and related to movement.  It still goes down the leg and the patient wants to proceed with today's treatment.  She also completed a round of antibiotics for a UTI.  She is currently having no UTI symptoms.  Effectiveness:  Initial hour after procedure: 0 %. Subsequent 4-6 hours post-procedure: 100 %. Analgesia past initial 6 hours: 25 % (lasted 2 weeks). Ongoing improvement:  Analgesic: The patient initially indicated that she had attained no benefit from the injection however further questioning has revealed that she no longer has the radicular component to her pain and the only thing that remains is pain that is located on the right side of her lower back.  This would suggest and confirm that the patient has attained 100% relief of the radiculopathy which seems to be ongoing.  However, she continues to have pain on the right side of her lower back which appears to be facet mediated. Function: Somewhat improved ROM:  Somewhat improved   Pharmacotherapy Assessment  Analgesic: No chronic opioid analgesics therapy prescribed by our practice. None MME/day: 0 mg/day   Monitoring: [place] PMP: PDMP reviewed during this encounter.       Pharmacotherapy: No side-effects or adverse reactions reported. Compliance: No problems identified. Effectiveness: Clinically acceptable.  No notes on file  No results found for: "CBDTHCR" No results found for: "D8THCCBX" No results found for: "D9THCCBX"  UDS:  No results found for: "SUMMARY"    ROS  Constitutional: Denies any fever or chills Gastrointestinal: No reported hemesis, hematochezia, vomiting, or acute GI distress Musculoskeletal: Denies any acute onset joint swelling, redness, loss of ROM, or weakness Neurological: No reported episodes of acute onset apraxia, aphasia, dysarthria, agnosia, amnesia, paralysis, loss of coordination, or loss of consciousness  Medication Review  Calcium  Carb-Cholecalciferol, Cyanocobalamin , Melatonin, PreserVision/Lutein , Verapamil  HCl CR, acetaminophen , aspirin  EC, atorvastatin , budesonide-formoterol , carboxymethylcellulose, carvedilol , famotidine , ferrous sulfate, gabapentin , glipiZIDE , hydrALAZINE , insulin  glargine, loratadine , losartan , meclizine , mirabegron  ER, nitroGLYCERIN , progesterone, and sodium chloride   History Review  Allergy: Brandi [name] is allergic to accupril [quinapril hcl], micardis [telmisartan], and quinapril. Drug: Brandi [name]  reports no history of drug use. Alcohol :  reports no history of alcohol  use. Tobacco:  reports that she has never smoked. She has never used smokeless tobacco. Social:  Brandi [name]  reports that she has never smoked. She has never used smokeless tobacco. She reports that she does not drink alcohol  and does not use drugs. Medical:  has a past medical history of Acquired cyst of kidney, Anemia, Anxiety, Aortic atherosclerosis (HCC), Arthritis, degenerative, Atrial fibrillation and flutter (HCC),  Baden-Walker grade 3 cystocele, Chronic cystitis, Coronary artery disease (08/05/2010), Depression, Diabetic neuropathy (HCC), Dyspnea, GERD (gastroesophageal reflux disease), Headache, History of left atrial appendage closure (05/29/2018), HLD (hyperlipidemia), HTN (hypertension), Implantable loop recorder present (09/20/2016), Insomnia, Murmur, NSTEMI (non-ST elevated myocardial infarction) (HCC) (05/20/2018), NSVT (nonsustained ventricular tachycardia) (HCC) (01/2022), Obesity, OSA on CPAP, PONV (postoperative nausea and vomiting), Presence of drug coated stent in LAD coronary artery, PSVT (paroxysmal supraventricular tachycardia) (HCC) (01/2022), 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 Surgical: Brandi [name]  has a past surgical history that includes Cholecystectomy; Total knee arthroplasty (Bilateral); Septoplasty; Bunionectomy; LOOP RECORDER INSERTION (N/A, 09/20/2016); Colonoscopy with propofol  (N/A, 06/14/2017); Breast biopsy (Right, 2004); LEFT HEART CATH AND CORONARY ANGIOGRAPHY (N/A, 05/22/2018); XI Robotic assisted inguinal hernia repair with mesh (Left, 10/14/2020); Coronary artery bypass graft (N/A, 05/29/2018); LEFT ATRIAL APPENDAGE OCCLUSION (Left, 05/29/2018); Coronary angioplasty with stent (Left, 08/05/2010); LEFT HEART CATH AND CORONARY ANGIOGRAPHY (Left, 07/03/2011); LEFT HEART CATH AND CORONARY ANGIOGRAPHY (Left, 05/22/2018); and Hysteroscopy with D & C (N/A, 12/14/2022). Family: family history includes Bladder Cancer in her maternal aunt; Breast cancer in her cousin; COPD in an other family member; Diabetes in an other family member; Hypercholesterolemia in an other family member; Hypertension in an other family member; Skin cancer in an other family member.  Laboratory Chemistry Profile   Renal Lab Results  Component Value Date   BUN 10  10/27/2023   CREATININE 0.84 10/27/2023   LABCREA 29 04/13/2020   GFRAA 54 (L) 05/24/2018   GFRNONAA >60 10/27/2023    Hepatic Lab Results  Component Value Date   AST 14 (L) 10/27/2023   ALT 19 10/27/2023   ALBUMIN 2.6 (L) 10/27/2023   ALKPHOS 55 10/27/2023   LIPASE 22 10/27/2023    Electrolytes Lab Results  Component Value Date   NA 133 (L) 10/27/2023   K 3.3 (L) 10/27/2023   CL 103 10/27/2023   CALCIUM  8.5 (L) 10/27/2023   MG 1.9 05/13/2023   PHOS 3.8 02/06/2022    Bone Lab Results  Component Value Date   25OHVITD1 68 08/15/2023   25OHVITD2 <1.0 08/15/2023   25OHVITD3 68 08/15/2023    Inflammation (CRP: Acute Phase) (ESR: Chronic Phase) Lab Results  Component Value Date   CRP 2 08/15/2023   ESRSEDRATE 2 08/15/2023   LATICACIDVEN 1.8 10/24/2022         Note: Above Lab results reviewed.  Recent Imaging Review  DG PAIN CLINIC C-ARM 1-60 MIN NO REPORT Fluoro was used, but no Radiologist interpretation will be provided.  Please refer to "NOTES" tab for provider progress note. Note: Reviewed         Physical Exam  General appearance: Well nourished, well developed, and well hydrated. In no apparent acute distress Mental status: Alert, oriented x 3 (person, place, & time)       Respiratory: No evidence of acute respiratory distress Eyes: PERLA Vitals: BP (!) 155/78   Pulse 88   Temp (!) 97.2 F (36.2 C) (Temporal)   Resp 14   Ht 5\' 1"  (1.549 m)   Wt 180 lb (  81.6 kg)   SpO2 99%   BMI 34.01 kg/m  BMI: Estimated body mass index is 34.01 kg/m as calculated from the following:   Height as of this encounter: 5\' 1"  (1.549 m).   Weight as of this encounter: 180 lb (81.6 kg). Ideal: Ideal body weight: 47.8 kg (105 lb 6.1 oz) Adjusted ideal body weight: 61.3 kg (135 lb 3.6 oz)  Assessment   Diagnosis Status  1. Chronic low back pain (1ry area of Pain) (Bilateral) (R>L) w/ sciatica (Right)   2. Chronic lower extremity pain (2ry area of Pain) (Right)   3.  Lumbosacral radiculopathy at L5 (Right)   4. Lumbosacral radiculopathy at S1 (Right)   5. Low back pain radiating to leg (Right)   6. Postop check   7. Lumbar facet hypertrophy (Multilevel) (Bilateral)   8. Chronic low back pain (Right) w/o sciatica   9. Lumbar facet joint pain   10. Lumbar facet joint syndrome    Improved Resolved Resolved   Updated Problems: Problem  Chronic low back pain (Right) w/o sciatica  Lumbar Facet Joint Syndrome    Plan of Care  Problem-specific:  Assessment and Plan    Facet joint arthritis with back pain   Chronic right-sided back pain is linked to facet joint arthritis, with imaging showing degenerative changes from L3-4 through L5-S1, severe arthritis, and a grade one retrolisthesis of L4 over L5. Previous epidural steroid injections relieved leg pain but not back pain, indicating a facet joint issue. She is concerned about steroid side effects like hyperglycemia and ocular problems. A diagnostic injection without steroids is planned to avoid these side effects. If successful, radiofrequency ablation may be considered for longer-lasting relief, lasting three to eighteen months. Perform a diagnostic injection with anesthetic in the facet joints from L3-4 through L5-S1 without steroids. Consider radiofrequency ablation for longer-lasting pain relief if the diagnostic injection is successful.  Leg pain   Leg pain resolved after epidural steroid injections, confirming effective treatment for spinal canal issues.       Brandi [name] has a current medication list which includes the following long-term medication(s): budesonide-formoterol , carvedilol , gabapentin , glipizide , hydralazine , lantus  solostar, losartan , mirabegron  er, progesterone, sodium chloride , and verapamil  hcl cr.  Pharmacotherapy (Medications Ordered): No orders of the defined types were placed in this encounter.  Orders:  Orders Placed This Encounter  Procedures   LUMBAR  FACET(MEDIAL BRANCH NERVE BLOCK) MBNB    Diagnosis: Lumbar Facet Syndrome (M47.816); Lumbosacral Facet Syndrome (M47.817); Lumbar Facet Joint Pain (M54.59) Medical Necessity Statement: 1.Severe chronic axial low back pain causing functional impairment documented by ongoing pain scale assessments. 2.Pain present for longer than 3 months (Chronic) documented to have failed noninvasive conservative therapies. 3.Absence of untreated radiculopathy. 4.There is no radiological evidence of untreated fractures, tumor, infection, or deformity.  Physical Examination Findings: Positive Kemp Maneuver: (Y)  Positive Lumbar Hyperextension-Rotation provocative test: (Y)    Standing Status:   Future    Expiration Date:   01/30/2024    Scheduling Instructions:     Procedure: Lumbar facet Block     Type: Medial Branch Block     Side: Right-sided     Purpose: Diagnostic Radiologic Mapping     Level(s): L3-4, L4-5, L5-S1, and TBD by Fluoroscopic Mapping Facets (L2, L3, L4, L5, S1, and TBD Medial Branch)     Sedation: With Sedation.     Timeframe: As soon as schedule allows.    Where will this procedure be performed?:   [facility]   Nursing Instructions:    Please complete this patient's postprocedure evaluation.    Scheduling Instructions:     Please complete this patient's postprocedure evaluation.   Follow-up plan:   Return for (ECT): (R) L-FCT Blk #1 (NO STEROIDS).     Interventional Therapies  Risk Factors  Considerations  Medical Comorbidities:  T2IDDM  MO (BMI>35)  CAD s/p CABG x 3  Hx A-Fib/Flutter  Hx. Orthostatic Hypotension  Stage 3 CKD  GERD  HTN  Cystocele Grade 3  Incontinence     Planned  Pending:   Diagnostic right lumbar facet MBB #1    Under consideration:   Diagnostic right lumbar facet MBB #1    Completed:   Diagnostic/therapeutic right SI joint inj. x1 (08/21/2023) (100/100/0/0) (Requested by referring neurosurgeon Dr. Errol Heaps Abd-El-Barr  ) Diagnostic/therapeutic right L4-5 LESI x1 (10/16/2023) (0/100/25/LBP:25LEP:100)    Therapeutic  Palliative (PRN) options:   None established   Completed by other providers:   None reported     Recent Visits Date Type Provider Dept  10/16/23 Procedure visit Renaldo Caroli, MD [facility]  09/04/23 Office Visit Renaldo Caroli, MD [facility]  08/21/23 Procedure visit Renaldo Caroli, MD [facility]  08/15/23 Office Visit Renaldo Caroli, MD [facility]  Showing recent visits within past 90 days and meeting all other requirements Today's Visits Date Type Provider Dept  10/30/23 Office Visit Renaldo Caroli, MD [facility]  Showing today's visits and meeting all other requirements Future Appointments No visits were found meeting these conditions. Showing future appointments within next 90 days and meeting all other requirements   I discussed the assessment and treatment plan with the patient. The patient was provided an opportunity to ask questions and all were answered. The patient agreed with the plan and demonstrated an understanding of the instructions.  Patient advised to call back or seek an in-person evaluation if the symptoms or condition worsens.  Duration of encounter: 35 minutes.  Total time on encounter, as per AMA guidelines included both the face-to-face and non-face-to-face time personally spent by the physician and/or other qualified health care professional(s) on the day of the encounter (includes time in activities that require the physician or other qualified health care professional and does not include time in activities normally performed by clinical staff). Physician's time may include the following activities when performed: Preparing to see the patient (e.g., pre-charting review of records, searching for previously ordered imaging, lab work, and nerve conduction tests) Review of prior  analgesic pharmacotherapies. Reviewing PMP Interpreting ordered tests (e.g., lab work, imaging, nerve conduction tests) Performing post-procedure evaluations, including interpretation of diagnostic procedures Obtaining and/or reviewing separately obtained history Performing a medically appropriate examination and/or evaluation Counseling and educating the patient/family/caregiver Ordering medications, tests, or procedures Referring and communicating with other health care professionals (when not separately reported) Documenting clinical information in the electronic or other health record Independently interpreting results (not separately reported) and communicating results to the patient/ family/caregiver Care coordination (not separately reported)  Note by: Candi Chafe, MD (TTS and AI technology used. I apologize for any typographical errors that were not detected and corrected.) Date: 10/30/2023; Time: 2:41 PM

## 2023-10-30 NOTE — Patient Instructions (Signed)

## 2023-11-06 ENCOUNTER — Other Ambulatory Visit: Payer: Self-pay

## 2023-11-06 ENCOUNTER — Emergency Department
Admission: EM | Admit: 2023-11-06 | Discharge: 2023-11-07 | Disposition: A | Discharge status: Home / Self Care | Attending: Emergency Medicine | Admitting: Emergency Medicine

## 2023-11-06 DIAGNOSIS — R55 Syncope and collapse: Secondary | ICD-10-CM | POA: Insufficient documentation

## 2023-11-06 DIAGNOSIS — Z951 Presence of aortocoronary bypass graft: Secondary | ICD-10-CM | POA: Insufficient documentation

## 2023-11-06 DIAGNOSIS — I10 Essential (primary) hypertension: Secondary | ICD-10-CM | POA: Insufficient documentation

## 2023-11-06 DIAGNOSIS — I2510 Atherosclerotic heart disease of native coronary artery without angina pectoris: Secondary | ICD-10-CM | POA: Insufficient documentation

## 2023-11-06 DIAGNOSIS — E119 Type 2 diabetes mellitus without complications: Secondary | ICD-10-CM | POA: Insufficient documentation

## 2023-11-06 LAB — COMPREHENSIVE METABOLIC PANEL WITH GFR
ALT: 21 U/L (ref 0–44)
AST: 18 U/L (ref 15–41)
Albumin: 2.6 g/dL — ABNORMAL LOW (ref 3.5–5.0)
Alkaline Phosphatase: 81 U/L (ref 38–126)
Anion gap: 13 (ref 5–15)
BUN: 18 mg/dL (ref 8–23)
CO2: 22 mmol/L (ref 22–32)
Calcium: 8.5 mg/dL — ABNORMAL LOW (ref 8.9–10.3)
Chloride: 101 mmol/L (ref 98–111)
Creatinine, Ser: 0.89 mg/dL (ref 0.44–1.00)
GFR, Estimated: >60 mL/min (ref >60)
Glucose, Bld: 97 mg/dL (ref 70–99)
Potassium: 3.6 mmol/L (ref 3.5–5.1)
Sodium: 136 mmol/L (ref 135–145)
Total Bilirubin: 0.6 mg/dL (ref 0.0–1.2)
Total Protein: 5.2 g/dL — ABNORMAL LOW (ref 6.5–8.1)

## 2023-11-06 LAB — CBC WITH DIFFERENTIAL/PLATELET
Abs Immature Granulocytes: 0.02 10*3/uL (ref 0.00–0.07)
Basophils Absolute: 0 10*3/uL (ref 0.0–0.1)
Basophils Relative: 0 %
Eosinophils Absolute: 0.1 10*3/uL (ref 0.0–0.5)
Eosinophils Relative: 1 %
HCT: 38.8 % (ref 36.0–46.0)
Hemoglobin: 12.7 g/dL (ref 12.0–15.0)
Immature Granulocytes: 0 %
Lymphocytes Relative: 22 %
Lymphs Abs: 2 10*3/uL (ref 0.7–4.0)
MCH: 29.5 pg (ref 26.0–34.0)
MCHC: 32.7 g/dL (ref 30.0–36.0)
MCV: 90.2 fL (ref 80.0–100.0)
Monocytes Absolute: 0.8 10*3/uL (ref 0.1–1.0)
Monocytes Relative: 9 %
Neutro Abs: 6.4 10*3/uL (ref 1.7–7.7)
Neutrophils Relative %: 68 %
Platelets: 192 10*3/uL (ref 150–400)
RBC: 4.3 MIL/uL (ref 3.87–5.11)
RDW: 15.3 % (ref 11.5–15.5)
WBC: 9.4 10*3/uL (ref 4.0–10.5)
nRBC: 0 % (ref 0.0–0.2)

## 2023-11-06 LAB — TROPONIN I (HIGH SENSITIVITY): Troponin I (High Sensitivity): 8 ng/L (ref <18)

## 2023-11-06 MED ORDER — LACTATED RINGERS IV BOLUS
1000.0000 mL | Freq: Once | INTRAVENOUS | Status: AC
  Administered 2023-11-06: 1000 mL via INTRAVENOUS

## 2023-11-06 MED ORDER — CARVEDILOL 25 MG PO TABS: 12.5000 mg | ORAL_TABLET | Freq: Two times a day (BID) | ORAL | 0 refills | Status: DC

## 2023-11-06 NOTE — Group Note (Deleted)
 Date:  11/06/2023 Time:  9:45 PM  Group Topic/Focus:  Wrap-Up Group:   The focus of this group is to help patients review their daily goal of treatment and discuss progress on daily workbooks.     Participation Level:  {BHH PARTICIPATION EAVWU:98119}  Participation Quality:  {BHH PARTICIPATION QUALITY:22265}  Affect:  {BHH AFFECT:22266}  Cognitive:  {BHH COGNITIVE:22267}  Insight: {BHH Insight2:20797}  Engagement in Group:  {BHH ENGAGEMENT IN JYNWG:95621}  Modes of Intervention:  {BHH MODES OF INTERVENTION:22269}  Additional Comments:  ***  Maglione,Preciosa Bundrick E 11/06/2023, 9:45 PM

## 2023-11-06 NOTE — ED Provider Notes (Signed)
 Brandi [name] Provider Note   Event Date/Time   First MD Initiated Contact with Patient 11/06/23 2113     (approximate) History  No chief complaint on file.  HPI Brandi [name] is a 88 y.o. female with a past medical history of type 2 diabetes, hypertension, anxiety/depression, CAD status post three-vessel CABG, and recurrent syncopal episodes who presents complaining of a syncopal episode that occurred tonight after attempting to use the bathroom.  Patient states that she got up from the toilet and went in to see her son when she began feeling lightheaded.  Her son put her in her rollator and patient soon had an episode of loss of consciousness for approximately 15 seconds prior to return to her normal mental state.  Patient had no evidence of postictal period.  Patient's son at bedside states this is similar to previous episodes of syncope she has had in the past including an episode 4 days prior to arrival. ROS: Patient currently denies any vision changes, tinnitus, difficulty speaking, facial droop, sore throat, chest pain, shortness of breath, abdominal pain, nausea/vomiting/diarrhea, dysuria, or weakness/numbness/paresthesias in any extremity   Physical Exam  Triage Vital Signs: ED Triage Vitals  Encounter Vitals Group     BP      Systolic BP Percentile      Diastolic BP Percentile      Pulse      Resp      Temp      Temp src      SpO2      Weight      Height      Head Circumference      Peak Flow      Pain Score      Pain Loc      Pain Education      Exclude from Growth Chart    Most recent vital signs: There were no vitals filed for this visit. General: Awake, oriented x4. CV:  Good peripheral perfusion.  Resp:  Normal effort.  Abd:  No distention.  Other:  Elderly obese Caucasian female resting comfortably in no acute distress ED Results / Procedures / Treatments  Labs (all labs ordered are listed, but only abnormal results are  displayed) Labs Reviewed - No data to display EKG ED ECG REPORT I, Charleen Conn, the attending physician, personally viewed and interpreted this ECG. Date: 11/06/2023 EKG Time: 2119 Rate: 92 Rhythm: normal sinus rhythm QRS Axis: normal Intervals: normal ST/T Wave abnormalities: normal Narrative Interpretation: no evidence of acute ischemia PROCEDURES: Critical Care performed: No Procedures MEDICATIONS ORDERED IN ED: Medications - No data to display IMPRESSION / MDM / ASSESSMENT AND PLAN / ED COURSE  I reviewed the triage vital signs and the nursing notes.                             The patient is on the cardiac monitor to evaluate for evidence of arrhythmia and/or significant heart rate changes. Patient's presentation is most consistent with acute presentation with potential threat to life or bodily function. Patient presents with complaints of syncope/presyncope ED Workup:  CBC, BMP, Troponin, BNP, ECG, CXR Differential diagnosis includes HF, ICH, seizure, stroke, HOCM, ACS, aortic dissection, malignant arrhythmia, or GI bleed. Findings: EKG: No e/o STEMI. No evidence of Brugada's sign, delta wave, epsilon wave, significantly prolonged QTc, or malignant arrhythmia.  Disposition: Care of this patient will be signed out to the oncoming physician  at the end of my shift.  All pertinent patient information conveyed and all questions answered.  All further care and disposition decisions will be made by the oncoming physician.   FINAL CLINICAL IMPRESSION(S) / ED DIAGNOSES   Final diagnoses:  None   Rx / DC Orders   ED Discharge Orders     None      Note:  This document was prepared using Dragon voice recognition software and may include unintentional dictation errors.   Obie Silos K, MD 11/06/23 2300

## 2023-11-06 NOTE — ED Triage Notes (Signed)
 Pt presenting via EMS from home after syncopal episode. Per EMS report, pt was coming back from using bathroom when she passed out. EMS states pt was unresponsive on their arrival but now AAOx4. Pt was here for same on Saturday.   Pt also endorsing some nausea and abd pain which she states is d/t eating too much tonight.

## 2023-11-13 ENCOUNTER — Encounter: Payer: Self-pay | Admitting: Pain Medicine

## 2023-11-13 ENCOUNTER — Ambulatory Visit: Attending: Pain Medicine | Admitting: Pain Medicine

## 2023-11-13 ENCOUNTER — Ambulatory Visit
Admission: RE | Admit: 2023-11-13 | Discharge: 2023-11-13 | Disposition: A | Discharge status: Home / Self Care | Source: Ambulatory Visit | Attending: Pain Medicine | Admitting: Pain Medicine

## 2023-11-13 VITALS — BP 155/88 | HR 92 | Temp 97.4°F | Resp 16 | Ht 61.0 in | Wt 189.0 lb

## 2023-11-13 DIAGNOSIS — M47816 Spondylosis without myelopathy or radiculopathy, lumbar region: Secondary | ICD-10-CM | POA: Insufficient documentation

## 2023-11-13 DIAGNOSIS — G8929 Other chronic pain: Secondary | ICD-10-CM | POA: Diagnosis not present

## 2023-11-13 DIAGNOSIS — M5459 Other low back pain: Secondary | ICD-10-CM | POA: Diagnosis present

## 2023-11-13 DIAGNOSIS — M47817 Spondylosis without myelopathy or radiculopathy, lumbosacral region: Secondary | ICD-10-CM | POA: Insufficient documentation

## 2023-11-13 DIAGNOSIS — M545 Low back pain, unspecified: Secondary | ICD-10-CM | POA: Diagnosis present

## 2023-11-13 DIAGNOSIS — M4317 Spondylolisthesis, lumbosacral region: Secondary | ICD-10-CM | POA: Diagnosis present

## 2023-11-13 DIAGNOSIS — M79604 Pain in right leg: Secondary | ICD-10-CM | POA: Insufficient documentation

## 2023-11-13 MED ORDER — LIDOCAINE HCL 2 % IJ SOLN
20.0000 mL | Freq: Once | INTRAMUSCULAR | Status: AC
  Administered 2023-11-13: 400 mg

## 2023-11-13 MED ORDER — MIDAZOLAM HCL 5 MG/5ML IJ SOLN: 0.5000 mg | Freq: Once | INTRAMUSCULAR | Status: DC

## 2023-11-13 MED ORDER — TRIAMCINOLONE ACETONIDE 40 MG/ML IJ SUSP: 40.0000 mg | Freq: Once | INTRAMUSCULAR | Status: DC

## 2023-11-13 MED ORDER — FENTANYL CITRATE (PF) 100 MCG/2ML IJ SOLN: 25.0000 ug | INTRAMUSCULAR | Status: DC | PRN

## 2023-11-13 MED ORDER — MIDAZOLAM HCL 5 MG/5ML IJ SOLN
INTRAMUSCULAR | Status: AC
  Filled 2023-11-13: qty 5

## 2023-11-13 MED ORDER — FENTANYL CITRATE (PF) 100 MCG/2ML IJ SOLN
INTRAMUSCULAR | Status: AC
Start: 2023-11-13 — End: ?
  Filled 2023-11-13: qty 2

## 2023-11-13 MED ORDER — LIDOCAINE HCL 2 % IJ SOLN
INTRAMUSCULAR | Status: AC
  Filled 2023-11-13: qty 20

## 2023-11-13 MED ORDER — ROPIVACAINE HCL 2 MG/ML IJ SOLN
9.0000 mL | Freq: Once | INTRAMUSCULAR | Status: AC
  Administered 2023-11-13: 9 mL via PERINEURAL

## 2023-11-13 MED ORDER — PENTAFLUOROPROP-TETRAFLUOROETH EX AERO: INHALATION_SPRAY | Freq: Once | CUTANEOUS | Status: DC

## 2023-11-13 MED ORDER — ROPIVACAINE HCL 2 MG/ML IJ SOLN
INTRAMUSCULAR | Status: AC
  Filled 2023-11-13: qty 20

## 2023-11-13 NOTE — Progress Notes (Signed)
 Safety precautions to be maintained throughout the outpatient stay will include: orient to surroundings, keep bed in low position, maintain call bell within reach at all times, provide assistance with transfer out of bed and ambulation.

## 2023-11-13 NOTE — Patient Instructions (Addendum)
 Post-Procedure Discharge Instructions  Instructions: Apply ice:  Purpose: This will minimize any swelling and discomfort after procedure.  When: Day of procedure, as soon as you get home. How: Fill a plastic sandwich bag with crushed ice. Cover it with a small towel and apply to injection site. How long: (15 min on, 15 min off) Apply for 15 minutes then remove x 15 minutes.  Repeat sequence on day of procedure, until you go to bed. Apply heat:  Purpose: To treat any soreness and discomfort from the procedure. When: Starting the next day after the procedure. How: Apply heat to procedure site starting the day following the procedure. How long: May continue to repeat daily, until discomfort goes away. Food intake: Start with clear liquids (like water) and advance to regular food, as tolerated.  Physical activities: Keep activities to a minimum for the first 8 hours after the procedure. After that, then as tolerated. Driving: If you have received any sedation, be responsible and do not drive. You are not allowed to drive for 24 hours after having sedation. Blood thinner: (Applies only to those taking blood thinners) You may restart your blood thinner 6 hours after your procedure. Insulin: (Applies only to Diabetic patients taking insulin) As soon as you can eat, you may resume your normal dosing schedule. Infection prevention: Keep procedure site clean and dry. Shower daily and clean area with soap and water. Post-procedure Pain Diary: Extremely important that this be done correctly and accurately. Recorded information will be used to determine the next step in treatment. For the purpose of accuracy, follow these rules: Evaluate only the area treated. Do not report or include pain from an untreated area. For the purpose of this evaluation, ignore all other areas of pain, except for the treated area. After your procedure, avoid taking a long nap and attempting to complete the pain diary after you  wake up. Instead, set your alarm clock to go off every hour, on the hour, for the initial 8 hours after the procedure. Document the duration of the numbing medicine, and the relief you are getting from it. Do not go to sleep and attempt to complete it later. It will not be accurate. If you received sedation, it is likely that you were given a medication that may cause amnesia. Because of this, completing the diary at a later time may cause the information to be inaccurate. This information is needed to plan your care. Follow-up appointment: Keep your post-procedure follow-up evaluation appointment after the procedure (usually 2 weeks for most procedures, 6 weeks for radiofrequencies). DO NOT FORGET to bring you pain diary with you.   Expect: (What should I expect to see with my procedure?) From numbing medicine (AKA: Local Anesthetics): Numbness or decrease in pain. You may also experience some weakness, which if present, could last for the duration of the local anesthetic. Onset: Full effect within 15 minutes of injected. Duration: It will depend on the type of local anesthetic used. On the average, 1 to 8 hours.  From steroids (Applies only if steroids were used): Decrease in swelling or inflammation. Once inflammation is improved, relief of the pain will follow. Onset of benefits: Depends on the amount of swelling present. The more swelling, the longer it will take for the benefits to be seen. In some cases, up to 10 days. Duration: Steroids will stay in the system x 2 weeks. Duration of benefits will depend on multiple posibilities including persistent irritating factors. Side-effects: If present, they may typically  last 2 weeks (the duration of the steroids). Frequent: Cramps (if they occur, drink Gatorade and take over-the-counter Magnesium 450-500 mg once to twice a day); water retention with temporary weight gain; increases in blood sugar; decreased immune system response; increased  appetite. Occasional: Facial flushing (red, warm cheeks); mood swings; menstrual changes. Uncommon: Long-term decrease or suppression of natural hormones; bone thinning. (These are more common with higher doses or more frequent use. This is why we prefer that our patients avoid having any injection therapies in other practices.)  Very Rare: Severe mood changes; psychosis; aseptic necrosis. From procedure: Some discomfort is to be expected once the numbing medicine wears off. This should be minimal if ice and heat are applied as instructed.  Call if: (When should I call?) You experience numbness and weakness that gets worse with time, as opposed to wearing off. New onset bowel or bladder incontinence. (Applies only to procedures done in the spine)  Emergency Numbers: Durning business hours (Monday - Thursday, 8:00 AM - 4:00 PM) (Friday, 9:00 AM - 12:00 Noon): [phone] After hours: [phone] NOTE: If you are having a problem and are unable connect with, or to talk to a provider, then go to your nearest urgent care or emergency department. If the problem is serious and urgent, please call 911.  Moderate Conscious Sedation, Adult, Care After After the procedure, it is common to have: Sleepiness for a few hours. Impaired judgment for a few hours. Trouble with balance. Nausea or vomiting if you eat too soon. Follow these instructions at home: For the time period you were told by your health care provider:  Rest. Do not participate in activities where you could fall or become injured. Do not drive or use machinery. Do not drink alcohol. Do not take sleeping pills or medicines that cause drowsiness. Do not make important decisions or sign legal documents. Do not take care of children on your own. Eating and drinking Follow instructions from your health care provider about what you may eat and drink. Drink enough fluid to keep your urine pale yellow. If you vomit: Drink clear  fluids slowly and in small amounts as you are able. Clear fluids include water, ice chips, low-calorie sports drinks, and fruit juice that has water added to it (diluted fruit juice). Eat light and bland foods in small amounts as you are able. These foods include bananas, applesauce, rice, lean meats, toast, and crackers. General instructions Take over-the-counter and prescription medicines only as told by your health care provider. Have a responsible adult stay with you for the time you are told. Do not use any products that contain nicotine or tobacco. These products include cigarettes, chewing tobacco, and vaping devices, such as e-cigarettes. If you need help quitting, ask your health care provider. Return to your normal activities as told by your health care provider. Ask your health care provider what activities are safe for you. Your health care provider may give you more instructions. Make sure you know what you can and cannot do.   Contact a health care provider if: You are still sleepy or having trouble with balance after 24 hours. You feel light-headed. You vomit every time you eat or drink. You get a rash. You have a fever. You have redness or swelling around the IV site. Get help right away if: You have trouble breathing. You start to feel confused at home. These symptoms may be an emergency. Get help right away. Call 911. Do not wait to see  if the symptoms will go away. Do not drive yourself to the hospital. This information is not intended to replace advice given to you by your health care provider. Make sure you discuss any questions you have with your health care provider. Document Revised: 12/19/2021 Document Reviewed: 12/19/2021 [employer] Patient Education  2024 [employer].

## 2023-11-13 NOTE — Progress Notes (Signed)
 PROVIDER NOTE: Interpretation of information contained herein should be left to medically-trained personnel. Specific patient instructions are provided elsewhere under "Patient Instructions" section of medical record. This document was created in part using STT-dictation technology, any transcriptional errors that may result from this process are unintentional.  Patient: Brandi [name] Type: Established DOB: [birth year] MRN: [id number] PCP: Little Riff, MD  Service: Procedure DOS: 11/13/2023 Setting: Ambulatory Location: Ambulatory outpatient facility Delivery: Face-to-face Provider: Candi Chafe, MD Specialty: Interventional Pain Management Specialty designation: 09 Location: Outpatient facility Ref. Prov.: Little Riff, MD       Interventional Therapy   Type: Lumbar Facet, Medial Branch Block(s) (w/ fluoroscopic mapping) #1 (w/o STEROIDS) Laterality: Right  Level: L2, L3, L4, L5, and S1 Medial Branch Level(s). Injecting these levels blocks the L3-4, L4-5, and L5-S1 lumbar facet joints.  Imaging: Fluoroscopic guidance Spinal ([id number]) Anesthesia: Local anesthesia (1-2% Lidocaine ) Anxiolysis: IV Versed  1.0 mg Sedation: No Sedation                       DOS: 11/13/2023 Performed by: Candi Chafe, MD  Primary Purpose: Diagnostic/Therapeutic Indications: Low back pain severe enough to impact quality of life or function. 1. Chronic low back pain (Right) w/o sciatica   2. Lumbar facet joint pain   3. Lumbar facet hypertrophy (Multilevel) (Bilateral)   4. Lumbar facet joint syndrome   5. Grade 1 Anterolisthesis of lumbosacral spine of L3/L4 and L5/S1   6. Spondylosis without myelopathy or radiculopathy, lumbosacral region   7. Low back pain radiating to leg (Right)    NAS-11 Pain score:   Pre-procedure: 3 /10   Post-procedure: 0-No pain/10     Position / Prep / Materials:  Position: Prone  Prep solution: ChloraPrep (2% chlorhexidine  gluconate and 70%  isopropyl alcohol ) Area Prepped: Posterolateral Lumbosacral Spine (Wide prep: From the lower border of the scapula down to the end of the tailbone and from flank to flank.)  Materials:  Tray: Block Needle(s):  Type: Spinal  Gauge (G): 22  Length: 5-in Qty: 4     H&P (Pre-op Assessment):  Brandi [name] is a 88 y.o. (year old), female patient, seen today for interventional treatment. She  has a past surgical history that includes Cholecystectomy; Total knee arthroplasty (Bilateral); Septoplasty; Bunionectomy; LOOP RECORDER INSERTION (N/A, 09/20/2016); Colonoscopy with propofol  (N/A, 06/14/2017); Breast biopsy (Right, 2004); LEFT HEART CATH AND CORONARY ANGIOGRAPHY (N/A, 05/22/2018); XI Robotic assisted inguinal hernia repair with mesh (Left, 10/14/2020); Coronary artery bypass graft (N/A, 05/29/2018); LEFT ATRIAL APPENDAGE OCCLUSION (Left, 05/29/2018); Coronary angioplasty with stent (Left, 08/05/2010); LEFT HEART CATH AND CORONARY ANGIOGRAPHY (Left, 07/03/2011); LEFT HEART CATH AND CORONARY ANGIOGRAPHY (Left, 05/22/2018); and Hysteroscopy with D & C (N/A, 12/14/2022). [name] has a current medication list which includes the following prescription(s): acetaminophen , aspirin  ec, atorvastatin , budesonide-formoterol , calcium  carb-cholecalciferol, carboxymethylcellulose, carvedilol , cyanocobalamin , famotidine , ferrous sulfate, gabapentin , glipizide , hydralazine , lantus  solostar, loratadine , losartan , meclizine , melatonin, mirabegron  er, preservision/lutein , nitroglycerin , ozempic (1 mg/dose), progesterone, sodium chloride , and verapamil  hcl cr, and the following Facility-Administered Medications: midazolam  and pentafluoroprop-tetrafluoroeth. Her primarily concern today is the Back Pain (lower)  Initial Vital Signs:  Pulse/HCG Rate: 100  Temp: (!) 97 F (36.1 C) Resp: 16 BP: 133/78 SpO2: 100 %  BMI: Estimated body mass index is 35.71 kg/m as calculated from the following:   Height as of this encounter:  5\' 1"  (1.549 m).   Weight as of this encounter: 189 lb (85.7 kg).  Risk Assessment: Allergies: Reviewed. She is  allergic to accupril [quinapril hcl], micardis [telmisartan], and quinapril.  Allergy Precautions: None required Coagulopathies: Reviewed. None identified.  Blood-thinner therapy: None at this time Active Infection(s): Reviewed. None identified. Brandi [name] is afebrile  Site Confirmation: [name] was asked to confirm the procedure and laterality before marking the site Procedure checklist: Completed Consent: Before the procedure and under the influence of no sedative(s), amnesic(s), or anxiolytics, the patient was informed of the treatment options, risks and possible complications. To fulfill our ethical and legal obligations, as recommended by the American Medical Association's Code of Ethics, I have informed the patient of my clinical impression; the nature and purpose of the treatment or procedure; the risks, benefits, and possible complications of the intervention; the alternatives, including doing nothing; the risk(s) and benefit(s) of the alternative treatment(s) or procedure(s); and the risk(s) and benefit(s) of doing nothing. The patient was provided information about the general risks and possible complications associated with the procedure. These may include, but are not limited to: failure to achieve desired goals, infection, bleeding, organ or nerve damage, allergic reactions, paralysis, and death. In addition, the patient was informed of those risks and complications associated to Spine-related procedures, such as failure to decrease pain; infection (i.e.: Meningitis, epidural or intraspinal abscess); bleeding (i.e.: epidural hematoma, subarachnoid hemorrhage, or any other type of intraspinal or peri-dural bleeding); organ or nerve damage (i.e.: Any type of peripheral nerve, nerve root, or spinal cord injury) with subsequent damage to sensory, motor, and/or autonomic systems,  resulting in permanent pain, numbness, and/or weakness of one or several areas of the body; allergic reactions; (i.e.: anaphylactic reaction); and/or death. Furthermore, the patient was informed of those risks and complications associated with the medications. These include, but are not limited to: allergic reactions (i.e.: anaphylactic or anaphylactoid reaction(s)); adrenal axis suppression; blood sugar elevation that in diabetics may result in ketoacidosis or comma; water retention that in patients with history of congestive heart failure may result in shortness of breath, pulmonary edema, and decompensation with resultant heart failure; weight gain; swelling or edema; medication-induced neural toxicity; particulate matter embolism and blood vessel occlusion with resultant organ, and/or nervous system infarction; and/or aseptic necrosis of one or more joints. Finally, the patient was informed that Medicine is not an exact science; therefore, there is also the possibility of unforeseen or unpredictable risks and/or possible complications that may result in a catastrophic outcome. The patient indicated having understood very clearly. We have given the patient no guarantees and we have made no promises. Enough time was given to the patient to ask questions, all of which were answered to the patient's satisfaction. Ms. Tuch has indicated that she wanted to continue with the procedure. Attestation: I, the ordering provider, attest that I have discussed with the patient the benefits, risks, side-effects, alternatives, likelihood of achieving goals, and potential problems during recovery for the procedure that I have provided informed consent. Date  Time: 11/13/2023 10:50 AM  Pre-Procedure Preparation:  Monitoring: As per clinic protocol. Respiration, ETCO2, SpO2, BP, heart rate and rhythm monitor placed and checked for adequate function Safety Precautions: Patient was assessed for positional comfort and  pressure points before starting the procedure. Time-out: I initiated and conducted the "Time-out" before starting the procedure, as per protocol. The patient was asked to participate by confirming the accuracy of the "Time Out" information. Verification of the correct person, site, and procedure were performed and confirmed by me, the nursing staff, and the patient. "Time-out" conducted as per Joint Commission's Universal Protocol (UP.01.01.01).  Time: 1150 Start Time: 1150 hrs.  Description of Procedure:          Laterality: (see above) Targeted Levels: (see above)  Safety Precautions: Aspiration looking for blood return was conducted prior to all injections. At no point did we inject any substances, as a needle was being advanced. Before injecting, the patient was told to immediately notify me if she was experiencing any new onset of "ringing in the ears, or metallic taste in the mouth". No attempts were made at seeking any paresthesias. Safe injection practices and needle disposal techniques used. Medications properly checked for expiration dates. SDV (single dose vial) medications used. After the completion of the procedure, all disposable equipment used was discarded in the proper designated medical waste containers. Local Anesthesia: Protocol guidelines were followed. The patient was positioned over the fluoroscopy table. The area was prepped in the usual manner. The time-out was completed. The target area was identified using fluoroscopy. A 12-in long, straight, sterile hemostat was used with fluoroscopic guidance to locate the targets for each level blocked. Once located, the skin was marked with an approved surgical skin marker. Once all sites were marked, the skin (epidermis, dermis, and hypodermis), as well as deeper tissues (fat, connective tissue and muscle) were infiltrated with a small amount of a short-acting local anesthetic, loaded on a 10cc syringe with a 25G, 1.5-in  Needle. An  appropriate amount of time was allowed for local anesthetics to take effect before proceeding to the next step. Local Anesthetic: Lidocaine  2.0% The unused portion of the local anesthetic was discarded in the proper designated containers. Technical description of process:  L2 Medial Branch Nerve Block (MBB): The target area for the L2 medial branch is at the junction of the postero-lateral aspect of the superior articular process and the superior, posterior, and medial edge of the transverse process of L3. Under fluoroscopic guidance, a Quincke needle was inserted until contact was made with os over the superior postero-lateral aspect of the pedicular shadow (target area). After negative aspiration for blood, 0.5 mL of the nerve block solution was injected without difficulty or complication. The needle was removed intact. L3 Medial Branch Nerve Block (MBB): The target area for the L3 medial branch is at the junction of the postero-lateral aspect of the superior articular process and the superior, posterior, and medial edge of the transverse process of L4. Under fluoroscopic guidance, a Quincke needle was inserted until contact was made with os over the superior postero-lateral aspect of the pedicular shadow (target area). After negative aspiration for blood, 0.5 mL of the nerve block solution was injected without difficulty or complication. The needle was removed intact. L4 Medial Branch Nerve Block (MBB): The target area for the L4 medial branch is at the junction of the postero-lateral aspect of the superior articular process and the superior, posterior, and medial edge of the transverse process of L5. Under fluoroscopic guidance, a Quincke needle was inserted until contact was made with os over the superior postero-lateral aspect of the pedicular shadow (target area). After negative aspiration for blood, 0.5 mL of the nerve block solution was injected without difficulty or complication. The needle was  removed intact. L5 Medial Branch Nerve Block (MBB): The target area for the L5 medial branch is at the junction of the postero-lateral aspect of the superior articular process and the superior, posterior, and medial edge of the sacral ala. Under fluoroscopic guidance, a Quincke needle was inserted until contact was made with os over the superior postero-lateral  aspect of the pedicular shadow (target area). After negative aspiration for blood, 0.5 mL of the nerve block solution was injected without difficulty or complication. The needle was removed intact. S1 Medial Branch Nerve Block (MBB): The target area for the S1 medial branch is at the posterior and inferior 6 o'clock position of the L5-S1 facet joint. Under fluoroscopic guidance, the Quincke needle inserted for the L5 MBB was redirected until contact was made with os over the inferior and postero aspect of the sacrum, at the 6 o' clock position under the L5-S1 facet joint (Target area). After negative aspiration for blood, 0.5 mL of the nerve block solution was injected without difficulty or complication. The needle was removed intact.  Once the entire procedure was completed, the treated area was cleaned, making sure to leave some of the prepping solution back to take advantage of its long term bactericidal properties.         Illustration of the posterior view of the lumbar spine and the posterior neural structures. Laminae of L2 through S1 are labeled. DPRL5, dorsal primary ramus of L5; DPRS1, dorsal primary ramus of S1; DPR3, dorsal primary ramus of L3; FJ, facet (zygapophyseal) joint L3-L4; I, inferior articular process of L4; LB1, lateral branch of dorsal primary ramus of L1; IAB, inferior articular branches from L3 medial branch (supplies L4-L5 facet joint); IBP, intermediate branch plexus; MB3, medial branch of dorsal primary ramus of L3; NR3, third lumbar nerve root; S, superior articular process of L5; SAB, superior articular branches from  L4 (supplies L4-5 facet joint also); TP3, transverse process of L3.   Facet Joint Innervation (* possible contribution)  L1-2 T12, L1 (L2*)  Medial Branch  L2-3 L1, L2 (L3*)         "          "  L3-4 L2, L3 (L4*)         "          "  L4-5 L3, L4 (L5*)         "          "  L5-S1 L4, L5, S1          "          "    Vitals:   11/13/23 1150 11/13/23 1155 11/13/23 1159 11/13/23 1213  BP: 138/70 (!) 161/66 (!) 156/78 (!) 155/88  Pulse: 95 94 83 92  Resp: 16 15 15 16   Temp:    (!) 97.4 F (36.3 C)  SpO2: 96% 100% 100% 100%  Weight:      Height:         End Time: 1158 hrs.  Imaging Guidance (Spinal):         Type of Imaging Technique: Fluoroscopy Guidance (Spinal) Indication(s): Fluoroscopy guidance for needle placement to enhance accuracy in procedures requiring precise needle localization for targeted delivery of medication in or near specific anatomical locations not easily accessible without such real-time imaging assistance. Exposure Time: Please see nurses notes. Contrast: None used. Fluoroscopic Guidance: I was personally present during the use of fluoroscopy. "Tunnel Vision Technique" used to obtain the best possible view of the target area. Parallax error corrected before commencing the procedure. "Direction-depth-direction" technique used to introduce the needle under continuous pulsed fluoroscopy. Once target was reached, antero-posterior, oblique, and lateral fluoroscopic projection used confirm needle placement in all planes. Images permanently stored in EMR. Interpretation: No contrast injected. I personally interpreted the imaging intraoperatively. Adequate needle placement confirmed in multiple  planes. Permanent images saved into the patient's record.  Post-operative Assessment:  Post-procedure Vital Signs:  Pulse/HCG Rate: 92  Temp: (!) 97.4 F (36.3 C) Resp: 16 BP: (!) 155/88 SpO2: 100 %  EBL: None  Complications: No immediate post-treatment complications  observed by team, or reported by patient.  Note: The patient tolerated the entire procedure well. A repeat set of vitals were taken after the procedure and the patient was kept under observation following institutional policy, for this type of procedure. Post-procedural neurological assessment was performed, showing return to baseline, prior to discharge. The patient was provided with post-procedure discharge instructions, including a section on how to identify potential problems. Should any problems arise concerning this procedure, the patient was given instructions to immediately contact us , at any time, without hesitation. In any case, we plan to contact the patient by telephone for a follow-up status report regarding this interventional procedure.  Comments:  No additional relevant information.  Plan of Care (POC)  Orders:  Orders Placed This Encounter  Procedures   LUMBAR FACET(MEDIAL BRANCH NERVE BLOCK) MBNB    Scheduling Instructions:     Procedure: Lumbar facet block (AKA.: Lumbosacral medial branch nerve block)     Side: Right-sided     Level: L3-4, L4-5, L5-S1, and TBD Facets (L2, L3, L4, L5, S1, and TBD Medial Branch Nerves)     Sedation: Patient's choice.     Timeframe: Today    Where will this procedure be performed?:   [facility]   DG [facility] C-ARM 1-60 MIN NO REPORT    Intraoperative interpretation by procedural physician at [facility].    Standing Status:   Standing    Number of Occurrences:   1    Reason for exam::   Assistance in needle guidance and placement for procedures requiring needle placement in or near specific anatomical locations not easily accessible without such assistance.   Informed Consent Details: Physician/Practitioner Attestation; Transcribe to consent form and obtain patient signature    Nursing Order: Transcribe to consent form and obtain patient signature. Note: Always confirm laterality of pain with Ms. Felipe Horton, before  procedure.    Physician/Practitioner attestation of informed consent for procedure/surgical case:   I, the physician/practitioner, attest that I have discussed with the patient the benefits, risks, side effects, alternatives, likelihood of achieving goals and potential problems during recovery for the procedure that I have provided informed consent.    Procedure:   Lumbar Facet Block  under fluoroscopic guidance    Physician/Practitioner performing the procedure:   Darneshia Demary A. Barth Borne MD    Indication/Reason:   Low Back Pain, with our without leg pain, due to Facet Joint Arthralgia (Joint Pain) Spondylosis (Arthritis of the Spine), without myelopathy or radiculopathy (Nerve Damage).   Provide equipment / supplies at bedside    Procedure tray: "Block Tray" (Disposable  single use) Skin infiltration needle: Regular 1.5-in, 25-G, (x1) Block Needle type: Spinal Amount/quantity: 4 Size: Medium (5-inch) Gauge: 22G    Standing Status:   Standing    Number of Occurrences:   1    Specify:   Block Tray   Saline lock IV    Have LR [phone] mL available and administer at 125 mL/hr if patient becomes hypotensive.    Standing Status:   Standing    Number of Occurrences:   1    Chronic Opioid Analgesic:  No chronic opioid analgesics therapy prescribed by our practice. None MME/day: 0 mg/day    Medications ordered for  procedure: Meds ordered this encounter  Medications   lidocaine  (XYLOCAINE ) 2 % (with pres) injection 400 mg   pentafluoroprop-tetrafluoroeth (GEBAUERS) aerosol   midazolam  (VERSED ) 5 MG/5ML injection 0.5-2 mg    Make sure Flumazenil is available in the pyxis when using this medication. If oversedation occurs, administer 0.2 mg IV over 15 sec. If after 45 sec no response, administer 0.2 mg again over 1 min; may repeat at 1 min intervals; not to exceed 4 doses (1 mg)   DISCONTD: fentaNYL  (SUBLIMAZE ) injection 25-50 mcg    Make sure Narcan is available in the pyxis when using this  medication. In the event of respiratory depression (RR< 8/min): Titrate NARCAN (naloxone) in increments of 0.1 to 0.2 mg IV at 2-3 minute intervals, until desired degree of reversal.   ropivacaine  (PF) 2 mg/mL (0.2%) (NAROPIN ) injection 9 mL   DISCONTD: triamcinolone  acetonide (KENALOG -40) injection 40 mg   Medications administered: We administered lidocaine  and ropivacaine  (PF) 2 mg/mL (0.2%).  See the medical record for exact dosing, route, and time of administration.  Follow-up plan:   Return in about 2 weeks (around 11/27/2023) for (Face2F), (PPE).       Interventional Therapies  Risk Factors  Considerations  Medical Comorbidities:  T2IDDM  MO (BMI>35)  CAD s/p CABG x 3  Hx A-Fib/Flutter  Hx. Orthostatic Hypotension  Stage 3 CKD  GERD  HTN  Cystocele Grade 3  Incontinence     Planned  Pending:   Diagnostic right sacroiliac joint block #1 (Requested by referring neurosurgeon Dr. Errol Heaps Abd-El-Barr )   Under consideration:   Diagnostic right sacroiliac joint block #1  Diagnostic right lumbar facet MBB #1    Completed:   None at this time   Therapeutic  Palliative (PRN) options:   None established   Completed by other providers:   None reported       Recent Visits Date Type Provider Dept  10/30/23 Office Visit Renaldo Caroli, MD [facility]  10/16/23 Procedure visit Renaldo Caroli, MD [facility]  09/04/23 Office Visit Renaldo Caroli, MD [facility]  08/21/23 Procedure visit Renaldo Caroli, MD [facility]  08/15/23 Office Visit Renaldo Caroli, MD [facility]  Showing recent visits within past 90 days and meeting all other requirements Today's Visits Date Type Provider Dept  11/13/23 Procedure visit Renaldo Caroli, MD [facility]  Showing today's visits and meeting all other requirements Future Appointments Date Type Provider Dept  12/17/23 Appointment Renaldo Caroli, MD [facility]  Showing future appointments within next 90 days and meeting all other requirements  Disposition: Discharge home  Discharge (Date  Time): 11/13/2023; 1216 hrs.   Primary Care Physician: Little Riff, MD Location: [facility] Note by: Candi Chafe, MD (TTS technology used. I apologize for any typographical errors that were not detected and corrected.) Date: 11/13/2023; Time: 12:31 PM  Disclaimer:  Medicine is not an Visual merchandiser. The only guarantee in medicine is that nothing is guaranteed. It is important to note that the decision to proceed with this intervention was based on the information collected from the patient. The Data and conclusions were drawn from the patient's questionnaire, the interview, and the physical examination. Because the information was provided in large part by the patient, it cannot be guaranteed that it has not been purposely or unconsciously manipulated. Every effort has been made to obtain as much relevant data as possible for this evaluation. It is important  to note that the conclusions that lead to this procedure are derived in large part from the available data. Always take into account that the treatment will also be dependent on availability of resources and existing treatment guidelines, considered by other Pain Management Practitioners as being common knowledge and practice, at the time of the intervention. For Medico-Legal purposes, it is also important to point out that variation in procedural techniques and pharmacological choices are the acceptable norm. The indications, contraindications, technique, and results of the above procedure should only be interpreted and judged by a Board-Certified Interventional Pain Specialist with extensive familiarity and expertise in the same exact procedure and technique.

## 2023-11-14 ENCOUNTER — Observation Stay

## 2023-11-14 ENCOUNTER — Inpatient Hospital Stay
Admission: EM | Admit: 2023-11-14 | Discharge: 2023-11-19 | DRG: 312 | Disposition: A | Discharge status: Home Health | Attending: Family Medicine | Admitting: Family Medicine

## 2023-11-14 ENCOUNTER — Observation Stay
Admit: 2023-11-14 | Discharge: 2023-11-14 | Disposition: A | Discharge status: Home / Self Care | Attending: Internal Medicine

## 2023-11-14 ENCOUNTER — Encounter: Payer: Self-pay | Admitting: Internal Medicine

## 2023-11-14 ENCOUNTER — Other Ambulatory Visit: Payer: Self-pay

## 2023-11-14 DIAGNOSIS — E876 Hypokalemia: Secondary | ICD-10-CM | POA: Diagnosis present

## 2023-11-14 DIAGNOSIS — Z8249 Family history of ischemic heart disease and other diseases of the circulatory system: Secondary | ICD-10-CM

## 2023-11-14 DIAGNOSIS — Z79899 Other long term (current) drug therapy: Secondary | ICD-10-CM

## 2023-11-14 DIAGNOSIS — Z955 Presence of coronary angioplasty implant and graft: Secondary | ICD-10-CM

## 2023-11-14 DIAGNOSIS — N390 Urinary tract infection, site not specified: Secondary | ICD-10-CM | POA: Diagnosis present

## 2023-11-14 DIAGNOSIS — F325 Major depressive disorder, single episode, in full remission: Secondary | ICD-10-CM | POA: Diagnosis present

## 2023-11-14 DIAGNOSIS — Z7985 Long-term (current) use of injectable non-insulin antidiabetic drugs: Secondary | ICD-10-CM

## 2023-11-14 DIAGNOSIS — I4891 Unspecified atrial fibrillation: Secondary | ICD-10-CM | POA: Diagnosis present

## 2023-11-14 DIAGNOSIS — K5900 Constipation, unspecified: Secondary | ICD-10-CM | POA: Diagnosis present

## 2023-11-14 DIAGNOSIS — Z96653 Presence of artificial knee joint, bilateral: Secondary | ICD-10-CM | POA: Diagnosis present

## 2023-11-14 DIAGNOSIS — E1140 Type 2 diabetes mellitus with diabetic neuropathy, unspecified: Secondary | ICD-10-CM | POA: Diagnosis present

## 2023-11-14 DIAGNOSIS — I2510 Atherosclerotic heart disease of native coronary artery without angina pectoris: Secondary | ICD-10-CM | POA: Diagnosis present

## 2023-11-14 DIAGNOSIS — I4892 Unspecified atrial flutter: Secondary | ICD-10-CM | POA: Diagnosis present

## 2023-11-14 DIAGNOSIS — I951 Orthostatic hypotension: Principal | ICD-10-CM | POA: Diagnosis present

## 2023-11-14 DIAGNOSIS — Z7951 Long term (current) use of inhaled steroids: Secondary | ICD-10-CM

## 2023-11-14 DIAGNOSIS — E785 Hyperlipidemia, unspecified: Secondary | ICD-10-CM | POA: Diagnosis present

## 2023-11-14 DIAGNOSIS — R55 Syncope and collapse: Principal | ICD-10-CM | POA: Diagnosis present

## 2023-11-14 DIAGNOSIS — F419 Anxiety disorder, unspecified: Secondary | ICD-10-CM | POA: Diagnosis present

## 2023-11-14 DIAGNOSIS — K219 Gastro-esophageal reflux disease without esophagitis: Secondary | ICD-10-CM | POA: Diagnosis present

## 2023-11-14 DIAGNOSIS — Z79890 Hormone replacement therapy (postmenopausal): Secondary | ICD-10-CM

## 2023-11-14 DIAGNOSIS — G4733 Obstructive sleep apnea (adult) (pediatric): Secondary | ICD-10-CM

## 2023-11-14 DIAGNOSIS — I252 Old myocardial infarction: Secondary | ICD-10-CM

## 2023-11-14 DIAGNOSIS — Z794 Long term (current) use of insulin: Secondary | ICD-10-CM

## 2023-11-14 DIAGNOSIS — Z888 Allergy status to other drugs, medicaments and biological substances status: Secondary | ICD-10-CM

## 2023-11-14 DIAGNOSIS — Z7982 Long term (current) use of aspirin: Secondary | ICD-10-CM

## 2023-11-14 DIAGNOSIS — Z833 Family history of diabetes mellitus: Secondary | ICD-10-CM

## 2023-11-14 DIAGNOSIS — R000 Tachycardia, unspecified: Secondary | ICD-10-CM | POA: Diagnosis present

## 2023-11-14 DIAGNOSIS — Z951 Presence of aortocoronary bypass graft: Secondary | ICD-10-CM

## 2023-11-14 DIAGNOSIS — I10 Essential (primary) hypertension: Secondary | ICD-10-CM | POA: Diagnosis present

## 2023-11-14 DIAGNOSIS — E11649 Type 2 diabetes mellitus with hypoglycemia without coma: Secondary | ICD-10-CM | POA: Diagnosis present

## 2023-11-14 DIAGNOSIS — Z8619 Personal history of other infectious and parasitic diseases: Secondary | ICD-10-CM

## 2023-11-14 DIAGNOSIS — M1990 Unspecified osteoarthritis, unspecified site: Secondary | ICD-10-CM | POA: Diagnosis present

## 2023-11-14 DIAGNOSIS — G894 Chronic pain syndrome: Secondary | ICD-10-CM | POA: Diagnosis present

## 2023-11-14 DIAGNOSIS — B9620 Unspecified Escherichia coli [E. coli] as the cause of diseases classified elsewhere: Secondary | ICD-10-CM | POA: Diagnosis present

## 2023-11-14 DIAGNOSIS — Z7984 Long term (current) use of oral hypoglycemic drugs: Secondary | ICD-10-CM

## 2023-11-14 DIAGNOSIS — R6510 Systemic inflammatory response syndrome (SIRS) of non-infectious origin without acute organ dysfunction: Secondary | ICD-10-CM | POA: Diagnosis present

## 2023-11-14 DIAGNOSIS — I340 Nonrheumatic mitral (valve) insufficiency: Secondary | ICD-10-CM | POA: Diagnosis present

## 2023-11-14 DIAGNOSIS — E669 Obesity, unspecified: Secondary | ICD-10-CM | POA: Diagnosis present

## 2023-11-14 DIAGNOSIS — E119 Type 2 diabetes mellitus without complications: Secondary | ICD-10-CM

## 2023-11-14 LAB — URINALYSIS, COMPLETE (UACMP) WITH MICROSCOPIC
Bilirubin Urine: NEGATIVE
Glucose, UA: NEGATIVE mg/dL
Hgb urine dipstick: NEGATIVE
Ketones, ur: NEGATIVE mg/dL
Nitrite: POSITIVE — AB
Protein, ur: NEGATIVE mg/dL
Specific Gravity, Urine: 1.006 (ref 1.005–1.030)
WBC, UA: >50 WBC/hpf (ref 0–5)
pH: 6 (ref 5.0–8.0)

## 2023-11-14 LAB — CBC WITH DIFFERENTIAL/PLATELET
Abs Immature Granulocytes: 0.04 10*3/uL (ref 0.00–0.07)
Basophils Absolute: 0 10*3/uL (ref 0.0–0.1)
Basophils Relative: 1 %
Eosinophils Absolute: 0.3 10*3/uL (ref 0.0–0.5)
Eosinophils Relative: 4 %
HCT: 34.5 % — ABNORMAL LOW (ref 36.0–46.0)
Hemoglobin: 10.8 g/dL — ABNORMAL LOW (ref 12.0–15.0)
Immature Granulocytes: 1 %
Lymphocytes Relative: 25 %
Lymphs Abs: 2.1 10*3/uL (ref 0.7–4.0)
MCH: 28.8 pg (ref 26.0–34.0)
MCHC: 31.3 g/dL (ref 30.0–36.0)
MCV: 92 fL (ref 80.0–100.0)
Monocytes Absolute: 0.8 10*3/uL (ref 0.1–1.0)
Monocytes Relative: 9 %
Neutro Abs: 5.3 10*3/uL (ref 1.7–7.7)
Neutrophils Relative %: 60 %
Platelets: 228 10*3/uL (ref 150–400)
RBC: 3.75 MIL/uL — ABNORMAL LOW (ref 3.87–5.11)
RDW: 15.7 % — ABNORMAL HIGH (ref 11.5–15.5)
WBC: 8.6 10*3/uL (ref 4.0–10.5)
nRBC: 0 % (ref 0.0–0.2)

## 2023-11-14 LAB — BASIC METABOLIC PANEL WITH GFR
Anion gap: 6 (ref 5–15)
BUN: 12 mg/dL (ref 8–23)
CO2: 25 mmol/L (ref 22–32)
Calcium: 7.5 mg/dL — ABNORMAL LOW (ref 8.9–10.3)
Chloride: 107 mmol/L (ref 98–111)
Creatinine, Ser: 0.8 mg/dL (ref 0.44–1.00)
GFR, Estimated: >60 mL/min (ref >60)
Glucose, Bld: 123 mg/dL — ABNORMAL HIGH (ref 70–99)
Potassium: 3.4 mmol/L — ABNORMAL LOW (ref 3.5–5.1)
Sodium: 138 mmol/L (ref 135–145)

## 2023-11-14 LAB — TROPONIN I (HIGH SENSITIVITY)
Troponin I (High Sensitivity): 6 ng/L (ref <18)
Troponin I (High Sensitivity): 6 ng/L (ref <18)

## 2023-11-14 LAB — GLUCOSE, CAPILLARY: Glucose-Capillary: 96 mg/dL (ref 70–99)

## 2023-11-14 MED ORDER — FERROUS SULFATE 325 (65 FE) MG PO TABS
325.0000 mg | ORAL_TABLET | Freq: Every day | ORAL | Status: DC
  Administered 2023-11-15 – 2023-11-19 (×5): 325 mg via ORAL
  Filled 2023-11-14 (×5): qty 1

## 2023-11-14 MED ORDER — SENNOSIDES-DOCUSATE SODIUM 8.6-50 MG PO TABS: 1.0000 | ORAL_TABLET | Freq: Every evening | ORAL | Status: DC | PRN

## 2023-11-14 MED ORDER — GABAPENTIN 300 MG PO CAPS
300.0000 mg | ORAL_CAPSULE | Freq: Three times a day (TID) | ORAL | Status: AC
  Administered 2023-11-14: 300 mg via ORAL
  Filled 2023-11-14: qty 1

## 2023-11-14 MED ORDER — FLUTICASONE FUROATE-VILANTEROL 100-25 MCG/ACT IN AEPB
1.0000 | INHALATION_SPRAY | Freq: Every day | RESPIRATORY_TRACT | Status: DC
  Administered 2023-11-15 – 2023-11-19 (×5): 1 via RESPIRATORY_TRACT
  Filled 2023-11-14 (×2): qty 28

## 2023-11-14 MED ORDER — INSULIN ASPART 100 UNIT/ML IJ SOLN: 0.0000 [IU] | Freq: Every day | INTRAMUSCULAR | Status: DC

## 2023-11-14 MED ORDER — ACETAMINOPHEN 650 MG RE SUPP: 650.0000 mg | Freq: Four times a day (QID) | RECTAL | Status: AC | PRN

## 2023-11-14 MED ORDER — MELATONIN 5 MG PO TABS
10.0000 mg | ORAL_TABLET | Freq: Every day | ORAL | Status: DC
  Administered 2023-11-14 – 2023-11-18 (×5): 10 mg via ORAL
  Filled 2023-11-14 (×5): qty 2

## 2023-11-14 MED ORDER — LOSARTAN POTASSIUM 50 MG PO TABS
100.0000 mg | ORAL_TABLET | Freq: Every day | ORAL | Status: DC
  Administered 2023-11-14 – 2023-11-18 (×5): 100 mg via ORAL
  Filled 2023-11-14 (×5): qty 2

## 2023-11-14 MED ORDER — ONDANSETRON HCL 4 MG PO TABS: 4.0000 mg | ORAL_TABLET | Freq: Four times a day (QID) | ORAL | Status: AC | PRN

## 2023-11-14 MED ORDER — PROGESTERONE MICRONIZED 100 MG PO CAPS
100.0000 mg | ORAL_CAPSULE | Freq: Every day | ORAL | Status: DC
  Administered 2023-11-14 – 2023-11-18 (×5): 100 mg via ORAL
  Filled 2023-11-14 (×6): qty 1

## 2023-11-14 MED ORDER — POLYVINYL ALCOHOL 1.4 % OP SOLN
1.0000 [drp] | Freq: Every day | OPHTHALMIC | Status: DC
  Administered 2023-11-16: 2 [drp] via OPHTHALMIC
  Administered 2023-11-17 – 2023-11-18 (×2): 1 [drp] via OPHTHALMIC
  Filled 2023-11-14 (×2): qty 15

## 2023-11-14 MED ORDER — HEPARIN SODIUM (PORCINE) 5000 UNIT/ML IJ SOLN
5000.0000 [IU] | Freq: Three times a day (TID) | INTRAMUSCULAR | Status: DC
  Administered 2023-11-14 – 2023-11-19 (×14): 5000 [IU] via SUBCUTANEOUS
  Filled 2023-11-14 (×14): qty 1

## 2023-11-14 MED ORDER — ACETAMINOPHEN 325 MG PO TABS
650.0000 mg | ORAL_TABLET | Freq: Four times a day (QID) | ORAL | Status: AC | PRN
Start: 2023-11-14 — End: 2023-11-19
  Administered 2023-11-17: 650 mg via ORAL
  Filled 2023-11-14: qty 2

## 2023-11-14 MED ORDER — SODIUM CHLORIDE 0.9% FLUSH
3.0000 mL | Freq: Two times a day (BID) | INTRAVENOUS | Status: DC
  Administered 2023-11-14 – 2023-11-19 (×11): 3 mL via INTRAVENOUS

## 2023-11-14 MED ORDER — BISACODYL 5 MG PO TBEC
5.0000 mg | DELAYED_RELEASE_TABLET | Freq: Every day | ORAL | Status: DC | PRN
  Administered 2023-11-16: 5 mg via ORAL
  Filled 2023-11-14: qty 1

## 2023-11-14 MED ORDER — INSULIN GLARGINE-YFGN 100 UNIT/ML ~~LOC~~ SOLN
10.0000 [IU] | Freq: Every day | SUBCUTANEOUS | Status: DC
  Administered 2023-11-15: 10 [IU] via SUBCUTANEOUS
  Filled 2023-11-14 (×2): qty 0.1

## 2023-11-14 MED ORDER — ASPIRIN 81 MG PO TBEC
81.0000 mg | DELAYED_RELEASE_TABLET | Freq: Every day | ORAL | Status: DC
  Administered 2023-11-15 – 2023-11-19 (×5): 81 mg via ORAL
  Filled 2023-11-14 (×5): qty 1

## 2023-11-14 MED ORDER — ONDANSETRON HCL 4 MG/2ML IJ SOLN
4.0000 mg | Freq: Four times a day (QID) | INTRAMUSCULAR | Status: AC | PRN
  Administered 2023-11-16 – 2023-11-17 (×2): 4 mg via INTRAVENOUS
  Filled 2023-11-14 (×3): qty 2

## 2023-11-14 MED ORDER — CARVEDILOL 12.5 MG PO TABS
12.5000 mg | ORAL_TABLET | Freq: Two times a day (BID) | ORAL | Status: DC
  Administered 2023-11-14 – 2023-11-16 (×5): 12.5 mg via ORAL
  Filled 2023-11-14 (×5): qty 1

## 2023-11-14 MED ORDER — LORAZEPAM 0.5 MG PO TABS
0.5000 mg | ORAL_TABLET | Freq: Four times a day (QID) | ORAL | Status: AC | PRN
  Administered 2023-11-16 – 2023-11-17 (×2): 0.5 mg via ORAL
  Filled 2023-11-14 (×2): qty 1

## 2023-11-14 MED ORDER — HYDRALAZINE HCL 20 MG/ML IJ SOLN
5.0000 mg | Freq: Four times a day (QID) | INTRAMUSCULAR | Status: DC | PRN
  Administered 2023-11-15 – 2023-11-16 (×2): 5 mg via INTRAVENOUS
  Filled 2023-11-14 (×2): qty 1

## 2023-11-14 MED ORDER — INSULIN ASPART 100 UNIT/ML IJ SOLN
0.0000 [IU] | Freq: Three times a day (TID) | INTRAMUSCULAR | Status: DC
  Administered 2023-11-15: 2 [IU] via SUBCUTANEOUS
  Administered 2023-11-16: 3 [IU] via SUBCUTANEOUS
  Administered 2023-11-17: 2 [IU] via SUBCUTANEOUS
  Administered 2023-11-17: 3 [IU] via SUBCUTANEOUS
  Administered 2023-11-18: 2 [IU] via SUBCUTANEOUS
  Administered 2023-11-18 – 2023-11-19 (×3): 3 [IU] via SUBCUTANEOUS
  Administered 2023-11-19: 5 [IU] via SUBCUTANEOUS
  Filled 2023-11-14 (×9): qty 1

## 2023-11-14 NOTE — Assessment & Plan Note (Signed)
 PDMP reviewed Patient is currently on gabapentin  400 mg 3 times daily On admission I resumed gabapentin  300 mg 3 times daily due to patient with multiple episodes of near syncope and syncope over the last 2 months

## 2023-11-14 NOTE — Hospital Course (Signed)
 Ms. Syona Wroblewski is an 88 year old female with history of insulin -dependent diabetes mellitus, atrial fibrillation, atrial flutter, not on anticoagulation, hypertension, hyperlipidemia, anxiety, depression, CAD status post CABG x 3 in 2019, who presents emergency department for chief concerns of near syncope today with hypotension.  Patient has had 3 ED evaluation since April 2025 for syncopal events and 2 outpatient visits for syncopal events since April 2025.  Vitals in the ED currently showed temperature of 97.8, respiration rate 22, heart rate 82, blood pressure 146/54, SpO2 99% on room air.  Serum sodium is 138, potassium 3.4, chloride 107, bicarb 25, BUN of 12, serum creatinine 0.80, EGFR greater than 60, nonfasting blood glucose 123, WBC 8.6, hemoglobin 10.8, platelets of 228.  ED treatment: None  Per ED documentation, patient had a blood pressure of 71/36 upon standing with EMS.

## 2023-11-14 NOTE — ED Provider Notes (Signed)
 [facility] Provider Note    Event Date/Time   First MD Initiated Contact with Patient 11/14/23 1151     (approximate)   History   No chief complaint on file.  Pt here via [facility] with syncope. Pt states she was in the bathroom getting ready this morning and then felt like she was going to pass out and sat down. Pt denies hitting her head or completely passing out. EMS states pt bp was 71/36 when standing upright but came up to 118/70 when lying down, Pt reports a headache after ems was able to get her bp up.  128-cbg   HPI Brandi [name] is a 88 y.o. female with multiple medical comorbidities including prior CABG x 3 (2019), T2DM, NSVT, atrial fib/flutter not on anticoagulation, hypertension, hyperlipidemia presents for evaluation after syncopal episode - Per EMS, patient was in the bathroom getting ready to go to the clinic this morning, felt like she was going to pass out, sat on toilet, no fall/head strike.  EMS noted orthostatic vitals.   Per chart review, patient has had 2 prior visits to emergency department this month for syncopal episodes.  Subsequently seen in clinic on 11/09/2023 for evaluation of her recurrent syncope.  No orthostatic changes noted at that time.  Continued her usual carvedilol  dose and messaged cardiologist about possible outpatient cardiac monitor.      Physical Exam   Triage Vital Signs: ED Triage Vitals  Encounter Vitals Group     BP 11/14/23 1151 (!) 142/79     Systolic BP Percentile --      Diastolic BP Percentile --      Pulse Rate 11/14/23 1149 80     Resp 11/14/23 1149 16     Temp 11/14/23 1149 97.8 F (36.6 C)     Temp Source 11/14/23 1149 Oral     SpO2 11/14/23 1149 100 %     Weight 11/14/23 1152 188 lb 15 oz (85.7 kg)     Height 11/14/23 1152 5\' 1"  (1.549 m)     Head Circumference --      Peak Flow --      Pain Score 11/14/23 1152 0     Pain Loc --      Pain Education --      Exclude from Growth Chart --      Most recent vital signs: Vitals:   11/14/23 1500 11/14/23 1536  BP: (!) 151/70 119/82  Pulse: 79 84  Resp: (!) 21   Temp:  97.8 F (36.6 C)  SpO2: 100% 99%     General: Awake, no distress.  HEENT:  atraumatic CV:  Good peripheral perfusion. RRR, RP 2+ Resp:  Normal effort. CTAB Abd:  No distention. Nontender to deep palpation throughout   ED Results / Procedures / Treatments   Labs (all labs ordered are listed, but only abnormal results are displayed) Labs Reviewed  CBC WITH DIFFERENTIAL/PLATELET - Abnormal; Notable for the following components:      Result Value   RBC 3.75 (*)    Hemoglobin 10.8 (*)    HCT 34.5 (*)    RDW 15.7 (*)    All other components within normal limits  BASIC METABOLIC PANEL WITH GFR - Abnormal; Notable for the following components:   Potassium 3.4 (*)    Glucose, Bld 123 (*)    Calcium  7.5 (*)    All other components within normal limits  URINALYSIS, COMPLETE (UACMP) WITH MICROSCOPIC  TROPONIN I (HIGH  SENSITIVITY)  TROPONIN I (HIGH SENSITIVITY)     EKG  Ecg = sinus rhythm, rate 67, no ST elevation or depression, no significant repolarization abnormality, normal axis, normal intervals.  No clear evidence of ischemia on my read.   RADIOLOGY N/a    PROCEDURES:  Critical Care performed: No  Procedures   MEDICATIONS ORDERED IN ED: Medications  sodium chloride  flush (NS) 0.9 % injection 3 mL (has no administration in time range)  heparin  injection 5,000 Units (has no administration in time range)  senna-docusate (Senokot-S) tablet 1 tablet (has no administration in time range)  bisacodyl  (DULCOLAX) EC tablet 5 mg (has no administration in time range)  ondansetron  (ZOFRAN ) tablet 4 mg (has no administration in time range)    Or  ondansetron  (ZOFRAN ) injection 4 mg (has no administration in time range)  acetaminophen  (TYLENOL ) tablet 650 mg (has no administration in time range)    Or  acetaminophen  (TYLENOL ) suppository 650  mg (has no administration in time range)  insulin  aspart (novoLOG ) injection 0-5 Units (has no administration in time range)  insulin  aspart (novoLOG ) injection 0-15 Units (has no administration in time range)  aspirin  EC tablet 81 mg (has no administration in time range)  losartan  (COZAAR ) tablet 100 mg (has no administration in time range)  carvedilol  (COREG ) tablet 12.5 mg (has no administration in time range)  insulin  glargine-yfgn (SEMGLEE ) injection 10 Units (has no administration in time range)  progesterone (PROMETRIUM) capsule 100 mg (has no administration in time range)  ferrous sulfate tablet 325 mg (has no administration in time range)  melatonin tablet 10 mg (has no administration in time range)  polyvinyl alcohol  (LIQUIFILM TEARS) 1.4 % ophthalmic solution 1-2 drop (has no administration in time range)  fluticasone  furoate-vilanterol (BREO ELLIPTA ) 100-25 MCG/ACT 1 puff (has no administration in time range)  gabapentin  (NEURONTIN ) capsule 300 mg (has no administration in time range)  LORazepam  (ATIVAN ) tablet 0.5 mg (has no administration in time range)  hydrALAZINE  (APRESOLINE ) injection 5 mg (has no administration in time range)     IMPRESSION / MDM / ASSESSMENT AND PLAN / ED COURSE  I reviewed the triage vital signs and the nursing notes.                              DDX/MDM/AP: Differential diagnosis includes, but is not limited to, transient arrhythmia, consider orthostasis though appears patient does not consistently have orthostatic vital signs around all of her syncopal episodes.  Do not suspect secondary to acute underlying anemia or electrolyte abnormality.  Doubt ACS.  Do not clinically suspect PE at this time.  Overall, I am concerned about patient having had now for ED visits for syncopal episodes in the past 1.5-2 months.  Last echo in November 2024, grade 1 diastolic dysfunction, poorly visualized aortic valve with no obvious stenosis.  With her significant  cardiac history I do have high concern for possible underlying arrhythmia and I am concerned about patient having been unconscious for about 15 minutes today--anticipate need for admission given recurrent syncopal episodes.  Plan: - Labs - EKG - Cardiac monitoring - Anticipate need for admission, appears  Patient's presentation is most consistent with acute presentation with potential threat to life or bodily function.  The patient is on the cardiac monitor to evaluate for evidence of arrhythmia and/or significant heart rate changes.  ED course below.  ED workup unremarkable.  Admitted to hospitalist service for further evaluation and  management, cardiac monitoring, echo.  Clinical Course as of 11/14/23 1550  Wed Nov 14, 2023  1210 Spoke w/ son, Rad - pt brushed teeth, was leaning over vanity - pt started leaning toward shower, he caught her - she spoke briefly and then became nonresponsive - no fall/trauma - episode lasted 15 min - was breathing on her own - no convulsions  [MM]  1232 CBC with new mild anemia compared to 8 days ago  No leukocytosis  BMP reviewed, overall remarkable [MM]  1340 Trop wnl [MM]  1342 Hospitalist consult order placed [MM]    Clinical Course User Index [MM] Collis Deaner, MD     FINAL CLINICAL IMPRESSION(S) / ED DIAGNOSES   Final diagnoses:  Recurrent syncope     Rx / DC Orders   ED Discharge Orders     None        Note:  This document was prepared using Dragon voice recognition software and may include unintentional dictation errors.   Collis Deaner, MD 11/14/23 1550

## 2023-11-14 NOTE — Assessment & Plan Note (Signed)
 Lorazepam  0.5 mg every 6 hours as needed for anxiety, 2 doses ordered

## 2023-11-14 NOTE — Assessment & Plan Note (Addendum)
 Home long-acting insulin  equivalent of 10 units subcutaneous in the morning daily resumed Insulin  SSI with at bedtime coverage ordered

## 2023-11-14 NOTE — ED Triage Notes (Signed)
 Pt here via [facility] with syncope. Pt states she was in the bathroom getting ready this morning and then felt like she was going to pass out and sat down. Pt denies hitting her head or completely passing out. EMS states pt bp was 71/36 when standing upright but came up to 118/70 when lying down, Pt reports a headache after ems was able to get her bp up.  128-cbg

## 2023-11-14 NOTE — Progress Notes (Signed)
 Eeg done

## 2023-11-14 NOTE — Assessment & Plan Note (Addendum)
 Home Coreg  12.5 mg p.o. twice daily with meals, losartan  100 mg nightly were resumed Hydralazine  5 mg IV every 6 hours as needed for SBP greater 170, 5 days ordered

## 2023-11-14 NOTE — Assessment & Plan Note (Addendum)
 Etiology workup in progress, differentials include cva, seizure, cardiac valvular pathology, dysrhythmia, polypharmacy in setting of gabapentin  400 mg 3 times daily use with multiple blood pressure lowering medications including Coreg  12.5 mg p.o. twice daily, hydralazine  10 mg 3 times daily, losartan  100 mg nightly, verapamil  200 mg nightly Patient will need continuous telemetry monitoring on admission and likely Holter monitor on discharge MRI of the brain without contrast has been ordered Complete echo and portable chest x-ray were ordered EEG Fall precaution  Note: On admission I resumed Coreg  12.5 mg p.o. twice daily with meals and losartan  100 mg nightly.  I have not resumed hydralazine  10 mg p.o. 3 times daily or verapamil  200 mg nightly.  AM team to resume when the benefits outweigh the risk.

## 2023-11-14 NOTE — H&P (Addendum)
 History and Physical   Brandi [name] EAV:[id number] DOB: 04/19/1936 DOA: 11/14/2023  PCP: Little Riff, MD  Outpatient Specialists: Dr. Beau Bound, cardiology Patient coming from: Home via EMS  I have personally briefly reviewed patient's old medical records in [facility] EMR.  Chief Concern: Syncope  HPI: Ms. Brandi [name] is an 88 year old female with history of insulin -dependent diabetes mellitus, atrial fibrillation, atrial flutter, not on anticoagulation, hypertension, hyperlipidemia, anxiety, depression, CAD status post CABG x 3 in 2019, who presents emergency department for chief concerns of near syncope today with hypotension.  Patient has had 3 ED evaluation since April 2025 for syncopal events and 2 outpatient visits for syncopal events since April 2025.  Vitals in the ED currently showed temperature of 97.8, respiration rate 22, heart rate 82, blood pressure 146/54, SpO2 99% on room air.  Serum sodium is 138, potassium 3.4, chloride 107, bicarb 25, BUN of 12, serum creatinine 0.80, EGFR greater than 60, nonfasting blood glucose 123, WBC 8.6, hemoglobin 10.8, platelets of 228.  ED treatment: None  Per ED documentation, patient had a blood pressure of 71/36 upon standing with EMS. -------------------------------------- At bedside, patient is able me her first, last name, age, current location, current calendar year.   She reports new swelling of both her legs that started about one week ago.   She denies fever, chills, chest pain, abdominal pain, nausea, vomiting, dysuria, hematuria, diarrhea, blood in her stool.   Social history: She lives at home and her son lives with her. She does not use tobacco, etoh, and recreational drug use. She is retired and formerly was a Warden/ranger, [employer].  ROS: Constitutional: no weight change, no fever ENT/Mouth: no sore throat, no rhinorrhea Eyes: no eye pain, no vision changes Cardiovascular: no chest pain, no dyspnea,  no edema, no  palpitations Respiratory: no cough, no sputum, no wheezing Gastrointestinal: no nausea, no vomiting, no diarrhea, no constipation Genitourinary: no urinary incontinence, no dysuria, no hematuria Musculoskeletal: no arthralgias, no myalgias Skin: no skin lesions, no pruritus, Neuro: + weakness, no loss of consciousness, no syncope Psych: no anxiety, no depression, no decrease appetite Heme/Lymph: no bruising, no bleeding  ED Course: Discussed with EDP, patient requiring hospitalization for chief concerns of syncope and recurrent syncope over the last 2 months.  Assessment/Plan  Principal Problem:   Syncope Active Problems:   Recurrent syncope   S/P CABG x 3   Hyperlipidemia   Coronary arteriosclerosis   Depression, major, in remission (HCC)   Diabetic neuropathy (HCC)   OSA on CPAP   Diabetes mellitus type 2, insulin  dependent (HCC)   Anxiety   Presence of drug coated stent in LAD coronary artery   Chronic pain syndrome   Essential hypertension   Assessment and Plan:  * Syncope Etiology workup in progress, differentials include cva, seizure, cardiac valvular pathology, dysrhythmia, polypharmacy in setting of gabapentin  400 mg 3 times daily use with multiple blood pressure lowering medications including Coreg  12.5 mg p.o. twice daily, hydralazine  10 mg 3 times daily, losartan  100 mg nightly, verapamil  200 mg nightly Patient will need continuous telemetry monitoring on admission and likely Holter monitor on discharge MRI of the brain without contrast has been ordered Complete echo and portable chest x-ray were ordered EEG Fall precaution  Note: On admission I resumed Coreg  12.5 mg p.o. twice daily with meals and losartan  100 mg nightly.  I have not resumed hydralazine  10 mg p.o. 3 times daily or verapamil  200 mg nightly.  AM team to  resume when the benefits outweigh the risk.  Essential hypertension Home Coreg  12.5 mg p.o. twice daily with meals, losartan  100 mg nightly were  resumed Hydralazine  5 mg IV every 6 hours as needed for SBP greater 170, 5 days ordered  Chronic pain syndrome PDMP reviewed Patient is currently on gabapentin  400 mg 3 times daily On admission I resumed gabapentin  300 mg 3 times daily due to patient with multiple episodes of near syncope and syncope over the last 2 months  Anxiety Lorazepam  0.5 mg every 6 hours as needed for anxiety, 2 doses ordered  Diabetes mellitus type 2, insulin  dependent (HCC) Home long-acting insulin  equivalent of 10 units subcutaneous in the morning daily resumed Insulin  SSI with at bedtime coverage ordered  Chart reviewed.   DVT prophylaxis: Heparin  5000 units subcutaneous every 8 hours Code Status: full code  Diet: Heart healthy/carb modified Family Communication: updated son, Rad and cousin at bedside with patient's permission  Disposition Plan: Pending clinical course Consults called: none at this time. AM team to consult specialist as appropriate pending work up on admission  Admission status: Telemetry cardiac, observation  Past Medical History:  Diagnosis Date   Acquired cyst of kidney    Anemia    Anxiety    Aortic atherosclerosis (HCC)    Arthritis, degenerative    Atrial fibrillation and flutter (HCC)    a.) CHA2DS2VASc = 6 (age x 2, sex, HTN, prior MI, T2DM);  b.) s/p LAA closure duirng CABG 05/29/2018; c.) rate/rhythm maintained on oral carvedilol  + verapamil ; no chronic anticoagulation s/p LAA closure   Baden-Walker grade 3 cystocele    Chronic cystitis    Coronary artery disease 08/05/2010   a.) LHC/PCI 08/05/2010: 10% oLM, 70% pLAD-1 (3.0 x 23 mm Xience V DES), 75% pLAD-2, 40% mLAD, 50% dLAD; b.) LHC 07/03/2011: 10% oLM, 10% ISR pLAD, 50% mLAD, LCx with min luminal irregs - med mgmt; c.) LHC 05/22/2018: 80% mLCx, 70% OM3, 90% oLAD, 60% pLAD, 60% ost 1st sep, 90% p-mRCA, 40% o-pLAD --> CVTS; d.) s/p 3v CABG 05/29/2018   Depression    Diabetic neuropathy (HCC)    Dyspnea    GERD  (gastroesophageal reflux disease)    Headache    History of left atrial appendage closure 05/29/2018   HLD (hyperlipidemia)    HTN (hypertension)    Implantable loop recorder present 09/20/2016   a.) has reached EOL; no longer funcitoning/being interrogated   Insomnia    a.) uses melatonin PRN   Murmur    NSTEMI (non-ST elevated myocardial infarction) (HCC) 05/20/2018   a.) troponins were trended: 0.30 --> 0.25 --> 0.22 --> 0.24 ng/L; b.) LHC 05/22/2018: 80% mLCx, 70% OM3, 90% oLAD, 60% pLAD, 60% ost 1st sep, 90% p-mRCA, 40% o-pLAD --> CVTS consult; c.) 3v CABG 05/29/2018: LIMA-LAD, SVG-OM1, SVG-RCA   NSVT (nonsustained ventricular tachycardia) (HCC) 01/2022   a.) holter 01/2022: rates as fast as 195 bpm lasting up to 12 beats   Obesity    OSA on CPAP    PONV (postoperative nausea and vomiting)    Presence of drug coated stent in LAD coronary artery    PSVT (paroxysmal supraventricular tachycardia) (HCC) 01/2022   a.) holter 01/2022: rates as fast as 195 bpm lasting up to 12 beats   S/P CABG x 3 05/29/2018   a.) 3v CABG at [facility] 05/29/2018: LIMA-LAD, SVG-OM1, SVG-RCA   Sepsis (HCC)    Spinal stenosis    Syncopal episodes 10/2022   Syncope    T2DM (type  2 diabetes mellitus) (HCC)    Unstable angina (HCC)    Urge incontinence of urine    Past Surgical History:  Procedure Laterality Date   BREAST BIOPSY Right 2004   neg   BUNIONECTOMY     CHOLECYSTECTOMY     COLONOSCOPY WITH PROPOFOL  N/A 06/14/2017   Procedure: COLONOSCOPY WITH PROPOFOL ;  Surgeon: Deveron Fly, MD;  Location: [facility] ENDOSCOPY;  Service: Endoscopy;  Laterality: N/A;   CORONARY ANGIOPLASTY WITH STENT PLACEMENT Left 08/05/2010   Procedure: CORONARY ANGIOPLASTY WITH STENT PLACEMENT; Location: [facility]; Surgeon(s): Starlette Ebbs, MD (diagnostic) and Thais Fill, MD (interventional)   CORONARY ARTERY BYPASS GRAFT N/A 05/29/2018   Procedure: CORONARY ARTERY BYPASS GRAFT; Location: [facility]; Surgeon: Johnetta Nab, MD    HYSTEROSCOPY WITH D & C N/A 12/14/2022   Procedure: FRACTIONAL DILATATION AND CURETTAGE /HYSTEROSCOPY;  Surgeon: Carolynn Citrin, MD;  Location: [facility] ORS;  Service: Gynecology;  Laterality: N/A;   LEFT ATRIAL APPENDAGE OCCLUSION Left 05/29/2018   Procedure: LEFT ATRIAL APPENDAGE CLOSURE; Location: [facility]; Surgeon: Johnetta Nab, MD   LEFT HEART CATH AND CORONARY ANGIOGRAPHY N/A 05/22/2018   Procedure: LEFT HEART CATH AND CORONARY ANGIOGRAPHY;  Surgeon: Michelle Aid, MD;  Location: [facility] INVASIVE CV LAB;  Service: Cardiovascular;  Laterality: N/A;   LEFT HEART CATH AND CORONARY ANGIOGRAPHY Left 07/03/2011   Procedure: LEFT HEART CATH AND CORONARY ANGIOGRAPHY; Location: [facility]; Surgeon: Starlette Ebbs, MD   LEFT HEART CATH AND CORONARY ANGIOGRAPHY Left 05/22/2018   Procedure: LEFT HEART CATH AND CORONARY ANGIOGRAPHY; Location: [facility]; Surgeon: Starlette Ebbs, MD   LOOP RECORDER INSERTION N/A 09/20/2016   Procedure: Loop Recorder Insertion;  Surgeon: Percival Brace, MD;  Location: [facility] INVASIVE CV LAB;  Service: Cardiovascular;  Laterality: N/A;   SEPTOPLASTY     TOTAL KNEE ARTHROPLASTY Bilateral    XI ROBOTIC ASSISTED INGUINAL HERNIA REPAIR WITH MESH Left 10/14/2020   Procedure: XI ROBOTIC ASSISTED INGUINAL HERNIA REPAIR WITH MESH;  Surgeon: Flynn Hylan, MD;  Location: [facility] ORS;  Service: General;  Laterality: Left;   Social History:  reports that she has never smoked. She has never used smokeless tobacco. She reports that she does not drink alcohol  and does not use drugs.  Allergies  Allergen Reactions   Accupril [Quinapril Hcl] Rash   Micardis [Telmisartan] Other (See Comments)    Dizziness   Quinapril Rash   Family History  Problem Relation Age of Onset   Breast cancer Cousin    Skin cancer Other    Diabetes Other    Hypercholesterolemia Other    Hypertension Other    Bladder Cancer Maternal Aunt    COPD Other    Kidney disease Neg Hx    Kidney cancer Neg Hx    Family  history: Family history reviewed and not pertinent.  Prior to Admission medications   Medication Sig Start Date End Date Taking? Authorizing Provider  acetaminophen  (TYLENOL ) 500 MG tablet Take 1,000 mg by mouth 3 (three) times daily.   Yes [provider]  aspirin  81 MG EC tablet Take 81 mg by mouth daily.    Yes [provider]  atorvastatin  (LIPITOR) 40 MG tablet Take 40 mg by mouth every evening. 08/05/19  Yes [provider]  budesonide-formoterol  (SYMBICORT) 80-4.5 MCG/ACT inhaler Inhale 2 puffs into the lungs 2 (two) times daily.   Yes [provider]  carvedilol  (COREG ) 25 MG tablet Take 0.5 tablets (12.5 mg total) by mouth 2 (two) times daily with a meal. 11/06/23  Yes Iven Mark  K, MD  Cyanocobalamin  (VITAMIN B-12 PO) Take 1 tablet by mouth daily.   Yes [provider]  famotidine  (PEPCID ) 20 MG tablet Take 20 mg by mouth 2 (two) times daily.   Yes [provider]  ferrous sulfate 325 (65 FE) MG tablet Take 325 mg by mouth daily with breakfast.   Yes [provider]  gabapentin  (NEURONTIN ) 400 MG capsule Take 400 mg by mouth 3 (three) times daily. 06/27/21  Yes [provider]  glipiZIDE  (GLUCOTROL ) 5 MG tablet Take 1 tablet (5 mg total) by mouth daily. Patient taking differently: Take 5 mg by mouth daily before breakfast. 02/06/22  Yes Patel, Sona, MD  hydrALAZINE  (APRESOLINE ) 10 MG tablet Take 1 tablet (10 mg total) by mouth 3 (three) times daily. 05/13/23  Yes Amin, Ankit C, MD  Insulin  Glargine (LANTUS  SOLOSTAR) 100 UNIT/ML Solostar Pen Inject 10 Units into the skin every morning. 07/20/15  Yes [provider]  loratadine  (CLARITIN ) 10 MG tablet Take 10 mg by mouth every morning.   Yes [provider]  losartan  (COZAAR ) 100 MG tablet Take 100 mg by mouth at bedtime.   Yes [provider]  meclizine  (ANTIVERT ) 12.5 MG tablet Take 12.5 mg by mouth 3 (three) times daily as needed for  dizziness.   Yes [provider]  Melatonin 10 MG TABS Take 1 tablet by mouth at bedtime.   Yes [provider]  mirabegron  ER (MYRBETRIQ ) 50 MG TB24 tablet Take 1 tablet (50 mg total) by mouth daily. 06/19/23  Yes McGowan, Cathleen Coach A, PA-C  Multiple Vitamins-Minerals (PRESERVISION/LUTEIN ) CAPS Take 1 capsule by mouth 2 (two) times daily. 03/29/20  Yes Darus Engels A, DO  nitroGLYCERIN  (NITROSTAT ) 0.4 MG SL tablet Place 0.4 mg under the tongue every 5 (five) minutes as needed for chest pain.   Yes [provider]  OZEMPIC, 1 MG/DOSE, 4 MG/3ML SOPN Inject 1 mg into the skin once a week.   Yes [provider]  progesterone (PROMETRIUM) 100 MG capsule Take 100 mg by mouth at bedtime.   Yes [provider]  sodium chloride  (OCEAN) 0.65 % nasal spray Place 1 spray into the nose as needed.   Yes [provider]  Verapamil  HCl CR 300 MG CP24 Take 1 capsule by mouth at bedtime.   Yes [provider]  Calcium  Carb-Cholecalciferol (CALCIUM  CARBONATE+VITAMIN D  PO) Take 1 tablet by mouth daily at 6 (six) AM.    [provider]  carboxymethylcellulose (REFRESH PLUS) 0.5 % SOLN Apply 1-2 drops to eye at bedtime.     [provider]   Physical Exam: Vitals:   11/14/23 1200 11/14/23 1400 11/14/23 1430 11/14/23 1500  BP: (!) 146/54 (!) 166/58 112/78 (!) 151/70  Pulse: 82 74 78 79  Resp: (!) 22 16 (!) 23 (!) 21  Temp:      TempSrc:      SpO2: 99% 100% 100% 100%  Weight:      Height:       Constitutional: appears age-appropriate, frail, NAD, calm Eyes: PERRL, lids and conjunctivae normal ENMT: Mucous membranes are moist. Posterior pharynx clear of any exudate or lesions. Age-appropriate dentition. Hearing appropriate Neck: normal, supple, no masses, no thyromegaly Respiratory: clear to auscultation bilaterally, no wheezing, no crackles. Normal respiratory effort. No accessory muscle use.  Cardiovascular: Regular rate and  rhythm, no murmurs / rubs / gallops. No extremity edema. 2+ pedal pulses. No carotid bruits.  Abdomen: no tenderness, no masses palpated, no hepatosplenomegaly. Bowel  sounds positive.  Musculoskeletal: no clubbing / cyanosis. No joint deformity upper and lower extremities. Good ROM, no contractures, no atrophy. Normal muscle tone.  Skin: no rashes, lesions, ulcers. No induration Neurologic: Sensation intact. Strength 5/5 in all 4.  Psychiatric: Normal judgment and insight. Alert and oriented x 3. Normal mood.   EKG: independently reviewed, showing sinus rhythm with rate of 78, QTc 480  Chest x-ray on Admission: I personally reviewed and negative for x-ray evidence of acute cardiopulmonary process.  DG PAIN CLINIC C-ARM 1-60 MIN NO REPORT Result Date: 11/13/2023 Fluoro was used, but no Radiologist interpretation will be provided. Please refer to "NOTES" tab for provider progress note.  Labs on Admission: I have personally reviewed following labs  CBC: Recent Labs  Lab 11/14/23 1148  WBC 8.6  NEUTROABS 5.3  HGB 10.8*  HCT 34.5*  MCV 92.0  PLT 228   Basic Metabolic Panel: Recent Labs  Lab 11/14/23 1148  NA 138  K 3.4*  CL 107  CO2 25  GLUCOSE 123*  BUN 12  CREATININE 0.80  CALCIUM  7.5*   GFR: Estimated Creatinine Clearance: 51.1 mL/min (by C-G formula based on SCr of 0.8 mg/dL).  Urine analysis:    Component Value Date/Time   COLORURINE YELLOW (A) 10/27/2023 1133   APPEARANCEUR CLEAR (A) 10/27/2023 1133   APPEARANCEUR Cloudy (A) 05/04/2022 1107   LABSPEC 1.010 10/27/2023 1133   LABSPEC 1.010 02/22/2012 1204   PHURINE 6.0 10/27/2023 1133   GLUCOSEU NEGATIVE 10/27/2023 1133   GLUCOSEU Negative 02/22/2012 1204   HGBUR NEGATIVE 10/27/2023 1133   BILIRUBINUR NEGATIVE 10/27/2023 1133   BILIRUBINUR Negative 05/04/2022 1107   BILIRUBINUR Negative 02/22/2012 1204   KETONESUR NEGATIVE 10/27/2023 1133   PROTEINUR NEGATIVE 10/27/2023 1133   NITRITE NEGATIVE 10/27/2023  1133   LEUKOCYTESUR NEGATIVE 10/27/2023 1133   LEUKOCYTESUR 2+ 02/22/2012 1204   This document was prepared using Dragon Voice Recognition software and may include unintentional dictation errors.  Dr. Reinhold Carbine [facility]  If 7PM-7AM, please contact overnight-coverage provider If 7AM-7PM, please contact day attending provider [email or web address]  11/14/2023, 3:21 PM

## 2023-11-15 DIAGNOSIS — I2510 Atherosclerotic heart disease of native coronary artery without angina pectoris: Secondary | ICD-10-CM | POA: Diagnosis not present

## 2023-11-15 DIAGNOSIS — F325 Major depressive disorder, single episode, in full remission: Secondary | ICD-10-CM

## 2023-11-15 DIAGNOSIS — G4733 Obstructive sleep apnea (adult) (pediatric): Secondary | ICD-10-CM

## 2023-11-15 DIAGNOSIS — Z955 Presence of coronary angioplasty implant and graft: Secondary | ICD-10-CM

## 2023-11-15 DIAGNOSIS — G894 Chronic pain syndrome: Secondary | ICD-10-CM

## 2023-11-15 DIAGNOSIS — Z794 Long term (current) use of insulin: Secondary | ICD-10-CM

## 2023-11-15 DIAGNOSIS — I10 Essential (primary) hypertension: Secondary | ICD-10-CM

## 2023-11-15 DIAGNOSIS — E1149 Type 2 diabetes mellitus with other diabetic neurological complication: Secondary | ICD-10-CM

## 2023-11-15 DIAGNOSIS — R55 Syncope and collapse: Secondary | ICD-10-CM | POA: Diagnosis not present

## 2023-11-15 DIAGNOSIS — R569 Unspecified convulsions: Secondary | ICD-10-CM

## 2023-11-15 DIAGNOSIS — E119 Type 2 diabetes mellitus without complications: Secondary | ICD-10-CM

## 2023-11-15 DIAGNOSIS — E785 Hyperlipidemia, unspecified: Secondary | ICD-10-CM

## 2023-11-15 DIAGNOSIS — Z951 Presence of aortocoronary bypass graft: Secondary | ICD-10-CM

## 2023-11-15 LAB — CBC
HCT: 34.8 % — ABNORMAL LOW (ref 36.0–46.0)
Hemoglobin: 11.3 g/dL — ABNORMAL LOW (ref 12.0–15.0)
MCH: 29 pg (ref 26.0–34.0)
MCHC: 32.5 g/dL (ref 30.0–36.0)
MCV: 89.2 fL (ref 80.0–100.0)
Platelets: 238 10*3/uL (ref 150–400)
RBC: 3.9 MIL/uL (ref 3.87–5.11)
RDW: 15.8 % — ABNORMAL HIGH (ref 11.5–15.5)
WBC: 6.6 10*3/uL (ref 4.0–10.5)
nRBC: 0 % (ref 0.0–0.2)

## 2023-11-15 LAB — ECHOCARDIOGRAM COMPLETE
AR max vel: 1.23 cm2
AV Area VTI: 1.32 cm2
AV Area mean vel: 1.2 cm2
AV Mean grad: 8 mmHg
AV Peak grad: 14.4 mmHg
Ao pk vel: 1.9 m/s
Area-P 1/2: 4.5 cm2
Height: 61 in
S' Lateral: 2.6 cm
Weight: 3022.95 [oz_av]

## 2023-11-15 LAB — BASIC METABOLIC PANEL WITH GFR
Anion gap: 5 (ref 5–15)
BUN: 11 mg/dL (ref 8–23)
CO2: 28 mmol/L (ref 22–32)
Calcium: 8.1 mg/dL — ABNORMAL LOW (ref 8.9–10.3)
Chloride: 107 mmol/L (ref 98–111)
Creatinine, Ser: 0.7 mg/dL (ref 0.44–1.00)
GFR, Estimated: >60 mL/min (ref >60)
Glucose, Bld: 87 mg/dL (ref 70–99)
Potassium: 3.6 mmol/L (ref 3.5–5.1)
Sodium: 140 mmol/L (ref 135–145)

## 2023-11-15 LAB — GLUCOSE, CAPILLARY
Glucose-Capillary: 77 mg/dL (ref 70–99)
Glucose-Capillary: 147 mg/dL — ABNORMAL HIGH (ref 70–99)
Glucose-Capillary: 77 mg/dL (ref 70–99)
Glucose-Capillary: 108 mg/dL — ABNORMAL HIGH (ref 70–99)

## 2023-11-15 LAB — HEMOGLOBIN A1C
Hgb A1c MFr Bld: 7.6 % — ABNORMAL HIGH (ref 4.8–5.6)
Mean Plasma Glucose: 171.42 mg/dL

## 2023-11-15 NOTE — Progress Notes (Signed)
 PROGRESS NOTE    Brandi [name]  ZOX:[id number] DOB: [birth year] DOA: 11/14/2023 PCP: Little Riff, MD  No chief complaint on file.   Hospital Course:  Brandi [name] is an 88 year old female with insulin -dependent diabetes, atrial fibrillation and a flutter, not on anticoagulation, hypertension, hyperlipidemia, anxiety, depression, CAD status post CABG times 08/2017 who presents to the ED with near syncopal episode with associated hypotension.  Patient has had multiple prior ED evaluations for similar events.  On arrival to the ED this admission vital signs are stable.  Lab work is largely unremarkable.  Subjective: No acute events overnight. On evaluation today patient has no complaints. Reports she was able to stand earlier without syncope. Her son is at bedside   Objective: Vitals:   11/14/23 1952 11/14/23 2307 11/15/23 0438 11/15/23 0803  BP: (!) 194/83 (!) 170/70 (!) 157/79 (!) 190/84  Pulse: 98 (!) 102 93 (!) 104  Resp: 19 18 19    Temp: 97.7 F (36.5 C) 98.1 F (36.7 C) 98 F (36.7 C) 97.8 F (36.6 C)  TempSrc: Oral Oral Oral Oral  SpO2: 99% 99% 100% 99%  Weight:      Height:        Intake/Output Summary (Last 24 hours) at 11/15/2023 0841 Last data filed at 11/15/2023 8119 Gross per 24 hour  Intake 3 ml  Output 1400 ml  Net -1397 ml   Filed Weights   11/14/23 1152  Weight: 85.7 kg   Examination: General exam: Appears calm and comfortable, NAD  Respiratory system: No work of breathing, symmetric chest wall expansion Cardiovascular system: S1 & S2 heard, RRR.  Gastrointestinal system: Abdomen is nondistended, soft and nontender.  Neuro: Alert and oriented. No focal neurological deficits. Extremities: Symmetric, expected ROM Skin: No rashes, lesions Psychiatry: Demonstrates appropriate judgement and insight. Mood & affect appropriate for situation.   Assessment & Plan:  Principal Problem:   Syncope Active Problems:   Recurrent syncope   S/P CABG x 3    Hyperlipidemia   Coronary arteriosclerosis   Depression, major, in remission (HCC)   Diabetic neuropathy (HCC)   OSA on CPAP   Diabetes mellitus type 2, insulin  dependent (HCC)   Anxiety   Presence of drug coated stent in LAD coronary artery   Chronic pain syndrome   Essential hypertension    Syncope - Likely secondary to profound orthostasis.  Orthostatic vital signs reveal lying blood pressure: 222/68, pulse 77.  Standing blood pressure 117/80, pulse 101. - Suspect polypharmacy is playing a role.   - Repeat orthostatics ordered - Echocardiogram: LVEF 60 to 65%, no regional wall motion abnormalities, mild left ventricular hypertrophy, grade 1 diastolic dysfunction.  Mild mitral valve regurg, no aortic stenosis --EKG on arrival: Sinus rhythm, low voltage.  No obvious arrhythmia or ST changes - High-sensitivity troponin: 6 - Monitor on telemetry, may also require Holter monitor at discharge - Brain MRI: Extensive chronic small vessel ischemic disease, enlarged empty sella unchanged from prior exams. - EEG: WNL - Continue fall precautions - Glucose within normal limits  Orthostatic hypotension - Lying blood pressure is extremely elevated, SBP consistently above 180.  When standing has >173mmg hg drop. - Repeat orthostatic vitals today - Apply TED hose -- Deescalate antihypertensives  Polypharmacy -Patient is taking multiple antihistamines, and multiple antihypertensives.  Home medication list includes: Pepcid , meclizine , loratadine , Myrbetriq . Coreg , gabapentin  400 mg 3 times daily, losartan , verapamil , hydralazine  - Deprescribe where able.  Hypertension - Will resume home meds cautiously.  At this time  have resumed Coreg  and losartan  at lower doses - Hold on hydralazine  and verapamil  for now  Chronic pain syndrome - PDMP reviewed - Currently on gabapentin  400 mg 3 times daily, given concerns of syncope we will resume this at lower dose  Anxiety - Home meds   Type 2  diabetes, insulin -dependent - Continue basal/bolus with sliding scale.  Titrate as tolerated  CAD status post CABGx3, 2019 - Resume aspirin , statin.  Antihypertensive control as above  Atrial fibrillation, not on anticoagulation - Gradually resuming home meds as above.  Continue with lower dose Coreg  for now.  Currently rate controlled.  DVT prophylaxis: Heparin    Code Status: Full Code Disposition: Currently admitted for observation.  De-escalating medications.  Monitoring blood pressures closely.  Will discharge home  Consultants:    Procedures:    Antimicrobials:  Anti-infectives (From admission, onward)    None       Data Reviewed: I have personally reviewed following labs and imaging studies CBC: Recent Labs  Lab 11/14/23 1148 11/15/23 0550  WBC 8.6 6.6  NEUTROABS 5.3  --   HGB 10.8* 11.3*  HCT 34.5* 34.8*  MCV 92.0 89.2  PLT 228 238   Basic Metabolic Panel: Recent Labs  Lab 11/14/23 1148 11/15/23 0550  NA 138 140  K 3.4* 3.6  CL 107 107  CO2 25 28  GLUCOSE 123* 87  BUN 12 11  CREATININE 0.80 0.70  CALCIUM  7.5* 8.1*   GFR: Estimated Creatinine Clearance: 51.1 mL/min (by C-G formula based on SCr of 0.7 mg/dL). Liver Function Tests: No results for input(s): "AST", "ALT", "ALKPHOS", "BILITOT", "PROT", "ALBUMIN" in the last 168 hours. CBG: Recent Labs  Lab 11/14/23 1911 11/15/23 0801  GLUCAP 96 77    No results found for this or any previous visit (from the past 240 hours).   Radiology Studies: EEG adult Result Date: 11/15/2023 Brandi Lack, MD     11/15/2023  8:39 AM Patient Name: Brandi [name] MRN: [id number] Epilepsy Attending: [name] Referring Physician/Provider: Cedric Cohn, Brandi [name] Date: 11/14/2023 Duration: 26.43 mins Patient history: 88yo F with syncope. EEG to evaluate fro seizure Level of alertness: Awake AEDs during EEG study: None Technical aspects: This EEG study was done with scalp electrodes positioned according to the 10-20  International system of electrode placement. Electrical activity was reviewed with band pass filter of 1-70Hz , sensitivity of 7 uV/mm, display speed of 77mm/sec with a 60Hz  notched filter applied as appropriate. EEG data were recorded continuously and digitally stored.  Video monitoring was available and reviewed as appropriate. Description: The posterior dominant rhythm consists of 8-9 Hz activity of moderate voltage (25-35 uV) seen predominantly in posterior head regions, symmetric and reactive to eye opening and eye closing. Hyperventilation and photic stimulation were not performed.   IMPRESSION: This study is within normal limits. No seizures or epileptiform discharges were seen throughout the recording. A normal interictal EEG does not exclude the diagnosis of epilepsy. Brandi [name]   MR BRAIN WO CONTRAST Result Date: 11/14/2023 CLINICAL DATA:  Neuro deficit, acute, stroke suspected. Multiple syncope, near syncope events over the last 2 months. EXAM: MRI HEAD WITHOUT CONTRAST TECHNIQUE: Multiplanar, multiecho pulse sequences of the brain and surrounding structures were obtained without intravenous contrast. COMPARISON:  Head MRI 05/10/2023 FINDINGS: Brain: There is no evidence of an acute infarct, intracranial hemorrhage, mass, midline shift, or extra-axial fluid collection. Patchy to confluent T2 hyperintensities in the cerebral white matter bilaterally and in the pons are stable  to mildly increased and nonspecific but compatible with extensive chronic small vessel ischemic disease. There is mild generalized cerebral atrophy. An enlarged, empty sella is unchanged. Vascular: Major intracranial vascular flow voids are preserved. Skull and upper cervical spine: Unremarkable bone marrow signal. Sinuses/Orbits: Bilateral cataract extraction. Mild mucosal thickening in the right sphenoid and left maxillary sinuses. No significant mastoid fluid. Other: None. IMPRESSION: 1. No acute intracranial abnormality.  2. Extensive chronic small vessel ischemic disease. Electronically Signed   By: Brandi Lee M.D.   On: 11/14/2023 19:00   Portable Chest 1 View Result Date: 11/14/2023 CLINICAL DATA:  Near-syncope this morning. EXAM: PORTABLE CHEST 1 VIEW COMPARISON:  09/19/2023 FINDINGS: Normal sized heart. Stable loop recorder. Stable post CABG changes and left atrial clip. Clear lungs with normal vascularity. Tortuous and partially calcified thoracic aorta. Diffuse osteopenia. IMPRESSION: No active disease. Electronically Signed   By: Brandi [name] M.D.   On: 11/14/2023 15:50   DG PAIN CLINIC C-ARM 1-60 MIN NO REPORT Result Date: 11/13/2023 Fluoro was used, but no Radiologist interpretation will be provided. Please refer to "NOTES" tab for provider progress note.   Scheduled Meds:  aspirin  EC  81 mg Oral Daily   carvedilol   12.5 mg Oral BID WC   ferrous sulfate  325 mg Oral Q breakfast   fluticasone  furoate-vilanterol  1 puff Inhalation Daily   heparin   5,000 Units Subcutaneous Q8H   insulin  aspart  0-15 Units Subcutaneous TID WC   insulin  aspart  0-5 Units Subcutaneous QHS   insulin  glargine-yfgn  10 Units Subcutaneous Daily   losartan   100 mg Oral QHS   melatonin  10 mg Oral QHS   polyvinyl alcohol   1-2 drop Both Eyes QHS   progesterone  100 mg Oral QHS   sodium chloride  flush  3 mL Intravenous Q12H   Continuous Infusions:   LOS: 0 days  MDM: Patient is high risk for one or more organ failure.  They necessitate ongoing hospitalization for continued IV therapies and subsequent lab monitoring. Total time spent interpreting labs and vitals, reviewing the medical record, coordinating care amongst consultants and care team members, directly assessing and discussing care with the patient and/or family: 55 min  Brandi Soberanis, Brandi [name] [facility]  To contact the attending physician between 7A-7P please use Epic Chat. To contact the covering physician during after hours 7P-7A, please review Amion.   11/15/2023, 8:41 AM   *This document has been created with the assistance of dictation software. Please excuse typographical errors. *

## 2023-11-15 NOTE — Care Management Obs Status (Signed)
 MEDICARE OBSERVATION STATUS NOTIFICATION   Patient Details  Name: HALIMA FOGAL MRN: [id number] Date of Birth: 09-29-1935   Medicare Observation Status Notification Given:       Anise Kerns 11/15/2023, 10:33 AM

## 2023-11-15 NOTE — Procedures (Signed)
 Patient Name: Brandi [name]  MRN: [id number]  Epilepsy Attending: Arleene Lack  Referring Physician/Provider: Cedric Cohn, DO  Date: 11/14/2023 Duration: 26.43 mins  Patient history: 88yo F with syncope. EEG to evaluate fro seizure  Level of alertness: Awake  AEDs during EEG study: None  Technical aspects: This EEG study was done with scalp electrodes positioned according to the 10-20 International system of electrode placement. Electrical activity was reviewed with band pass filter of 1-70Hz , sensitivity of 7 uV/mm, display speed of 28mm/sec with a 60Hz  notched filter applied as appropriate. EEG data were recorded continuously and digitally stored.  Video monitoring was available and reviewed as appropriate.  Description: The posterior dominant rhythm consists of 8-9 Hz activity of moderate voltage (25-35 uV) seen predominantly in posterior head regions, symmetric and reactive to eye opening and eye closing. Hyperventilation and photic stimulation were not performed.     IMPRESSION: This study is within normal limits. No seizures or epileptiform discharges were seen throughout the recording.  A normal interictal EEG does not exclude the diagnosis of epilepsy.   Aryanne Gilleland O Yanky Vanderburg

## 2023-11-15 NOTE — Care Management Obs Status (Signed)
 MEDICARE OBSERVATION STATUS NOTIFICATION   Patient Details  Name: Brandi [name] MRN: [id number] Date of Birth: [birth year]   Medicare Observation Status Notification Given:       Anise Kerns 11/15/2023, 10:34 AM

## 2023-11-16 ENCOUNTER — Observation Stay

## 2023-11-16 DIAGNOSIS — R55 Syncope and collapse: Secondary | ICD-10-CM | POA: Diagnosis not present

## 2023-11-16 DIAGNOSIS — G894 Chronic pain syndrome: Secondary | ICD-10-CM | POA: Diagnosis not present

## 2023-11-16 DIAGNOSIS — I10 Essential (primary) hypertension: Secondary | ICD-10-CM | POA: Diagnosis not present

## 2023-11-16 LAB — CBC WITH DIFFERENTIAL/PLATELET
Abs Immature Granulocytes: 0.03 10*3/uL (ref 0.00–0.07)
Basophils Absolute: 0 10*3/uL (ref 0.0–0.1)
Basophils Relative: 1 %
Eosinophils Absolute: 0.2 10*3/uL (ref 0.0–0.5)
Eosinophils Relative: 4 %
HCT: 32.8 % — ABNORMAL LOW (ref 36.0–46.0)
Hemoglobin: 10.7 g/dL — ABNORMAL LOW (ref 12.0–15.0)
Immature Granulocytes: 1 %
Lymphocytes Relative: 37 %
Lymphs Abs: 2.4 10*3/uL (ref 0.7–4.0)
MCH: 28.8 pg (ref 26.0–34.0)
MCHC: 32.6 g/dL (ref 30.0–36.0)
MCV: 88.4 fL (ref 80.0–100.0)
Monocytes Absolute: 0.8 10*3/uL (ref 0.1–1.0)
Monocytes Relative: 12 %
Neutro Abs: 3 10*3/uL (ref 1.7–7.7)
Neutrophils Relative %: 45 %
Platelets: 224 10*3/uL (ref 150–400)
RBC: 3.71 MIL/uL — ABNORMAL LOW (ref 3.87–5.11)
RDW: 15.9 % — ABNORMAL HIGH (ref 11.5–15.5)
WBC: 6.5 10*3/uL (ref 4.0–10.5)
nRBC: 0 % (ref 0.0–0.2)

## 2023-11-16 LAB — COMPREHENSIVE METABOLIC PANEL WITH GFR
ALT: 21 U/L (ref 0–44)
AST: 18 U/L (ref 15–41)
Albumin: 1.9 g/dL — ABNORMAL LOW (ref 3.5–5.0)
Alkaline Phosphatase: 77 U/L (ref 38–126)
Anion gap: 7 (ref 5–15)
BUN: 12 mg/dL (ref 8–23)
CO2: 25 mmol/L (ref 22–32)
Calcium: 7.9 mg/dL — ABNORMAL LOW (ref 8.9–10.3)
Chloride: 106 mmol/L (ref 98–111)
Creatinine, Ser: 0.62 mg/dL (ref 0.44–1.00)
GFR, Estimated: >60 mL/min (ref >60)
Glucose, Bld: 67 mg/dL — ABNORMAL LOW (ref 70–99)
Potassium: 3.2 mmol/L — ABNORMAL LOW (ref 3.5–5.1)
Sodium: 138 mmol/L (ref 135–145)
Total Bilirubin: 0.6 mg/dL (ref 0.0–1.2)
Total Protein: 4 g/dL — ABNORMAL LOW (ref 6.5–8.1)

## 2023-11-16 LAB — GLUCOSE, CAPILLARY
Glucose-Capillary: 87 mg/dL (ref 70–99)
Glucose-Capillary: 106 mg/dL — ABNORMAL HIGH (ref 70–99)
Glucose-Capillary: 158 mg/dL — ABNORMAL HIGH (ref 70–99)
Glucose-Capillary: 92 mg/dL (ref 70–99)
Glucose-Capillary: 152 mg/dL — ABNORMAL HIGH (ref 70–99)

## 2023-11-16 LAB — MAGNESIUM: Magnesium: 1.7 mg/dL (ref 1.7–2.4)

## 2023-11-16 LAB — PHOSPHORUS: Phosphorus: 3.2 mg/dL (ref 2.5–4.6)

## 2023-11-16 MED ORDER — GABAPENTIN 300 MG PO CAPS: 300.0000 mg | ORAL_CAPSULE | Freq: Three times a day (TID) | ORAL | 0 refills | Status: DC

## 2023-11-16 MED ORDER — LANTUS SOLOSTAR 100 UNIT/ML ~~LOC~~ SOPN: 5.0000 [IU] | PEN_INJECTOR | SUBCUTANEOUS | Status: AC

## 2023-11-16 MED ORDER — LACTULOSE 10 GM/15ML PO SOLN
30.0000 g | Freq: Three times a day (TID) | ORAL | Status: DC
  Administered 2023-11-16 – 2023-11-17 (×3): 30 g via ORAL
  Filled 2023-11-16 (×4): qty 60

## 2023-11-16 MED ORDER — FLEET ENEMA RE ENEM
1.0000 | ENEMA | Freq: Once | RECTAL | Status: AC
  Administered 2023-11-16: 1 via RECTAL

## 2023-11-16 MED ORDER — POTASSIUM CHLORIDE CRYS ER 20 MEQ PO TBCR
40.0000 meq | EXTENDED_RELEASE_TABLET | Freq: Once | ORAL | Status: AC
  Administered 2023-11-16: 40 meq via ORAL
  Filled 2023-11-16: qty 2

## 2023-11-16 NOTE — Progress Notes (Signed)
 Occupational Therapy Evaluation Patient Details Name: Brandi [name] MRN: [id number] DOB: 10/16/1935 Today's Date: 11/16/2023   History of Present Illness   Patient is a 88 year old female with near syncope and hypotension. History of insulin -dependent diabetes, atrial fibrillation and a flutter, not on anticoagulation, hypertension, hyperlipidemia, anxiety, depression, CAD status post CABG.     Clinical Impressions Brandi [name] was seen for OT evaluation this date. Prior to hospital admission, pt received assistance with all ADLs from son. Pt lives with son. Pt presents to acute OT demonstrating impaired ADL performance and functional mobility 2/2 dizziness and low activity tolerance (See OT problem list for additional functional deficits). Pt currently requires MIN A for brief management during toileting, and MIN A for LE management for bed mobility. Pt reported dizziness when standing and demonstrated poor standing tolerance, BP seated was 143/94, HR 118. Pt would benefit from skilled OT services to address noted impairments and functional limitations (see below for any additional details) in order to maximize safety and independence while minimizing falls risk and caregiver burden. Anticipate the need for follow up OT services upon acute hospital DC.      If plan is discharge home, recommend the following:   A lot of help with walking and/or transfers;A lot of help with bathing/dressing/bathroom;Assistance with cooking/housework;Assist for transportation     Functional Status Assessment   Patient has had a recent decline in their functional status and demonstrates the ability to make significant improvements in function in a reasonable and predictable amount of time.     Equipment Recommendations   BSC/3in1     Recommendations for Other Services         Precautions/Restrictions   Precautions Precautions: Fall Recall of Precautions/Restrictions: Intact Restrictions Weight  Bearing Restrictions Per Provider Order: No     Mobility Bed Mobility Overal bed mobility: Needs Assistance Bed Mobility: Sit to Supine       Sit to supine: Min assist, HOB elevated, Used rails   General bed mobility comments: MIN A for LE management    Transfers Overall transfer level: Needs assistance Equipment used: Rolling walker (2 wheels) Transfers: Sit to/from Stand Sit to Stand: Contact guard assist     Step pivot transfers: Contact guard assist            Balance Overall balance assessment: Needs assistance Sitting-balance support: Bilateral upper extremity supported, Feet unsupported Sitting balance-Leahy Scale: Fair     Standing balance support: Bilateral upper extremity supported, Reliant on assistive device for balance Standing balance-Leahy Scale: Poor                             ADL either performed or assessed with clinical judgement   ADL Overall ADL's : Needs assistance/impaired                                       General ADL Comments: MIN A for brief management during toileting     Vision         Perception         Praxis         Pertinent Vitals/Pain Pain Assessment Pain Assessment: 0-10 Pain Score: 3  Pain Location: Generalized Pain Descriptors / Indicators: Discomfort, Grimacing Pain Intervention(s): Limited activity within patient's tolerance, Monitored during session, Repositioned     Extremity/Trunk Assessment Upper Extremity Assessment Upper Extremity  Assessment: Defer to OT evaluation   Lower Extremity Assessment Lower Extremity Assessment: Generalized weakness       Communication Communication Communication: No apparent difficulties   Cognition Arousal: Alert Behavior During Therapy: WFL for tasks assessed/performed Cognition: No apparent impairments                               Following commands: Impaired Following commands impaired: Follows one step commands  with increased time     Cueing  General Comments   Cueing Techniques: Verbal cues  patient on bed side commode on arrival to room but was unable to use the bathroom. she does report nausea after mobilizing and mild dizziness with activity. son present and very involved with care as he is the primary caregiver at home.   Exercises     Shoulder Instructions      Home Living Family/patient expects to be discharged to:: Private residence Living Arrangements: Children (son) Available Help at Discharge: Family;Available 24 hours/day Type of Home: House Home Access: Stairs to enter [employer] of Steps: 4 Entrance Stairs-Rails: Right Home Layout: Two level;Able to live on main level with bedroom/bathroom     Bathroom Shower/Tub: Producer, television/film/video: Handicapped height Bathroom Accessibility: No   Home Equipment: Rollator (4 wheels);Shower seat          Prior Functioning/Environment Prior Level of Function : Working/employed             Mobility Comments: rollator used for ambulation ADLs Comments: son assists with ADLs    OT Problem List: Decreased activity tolerance;Impaired balance (sitting and/or standing)   OT Treatment/Interventions: Self-care/ADL training      OT Goals(Current goals can be found in the care plan section)   Acute Rehab OT Goals Patient Stated Goal: to go home OT Goal Formulation: With patient/family Time For Goal Achievement: 11/30/23 Potential to Achieve Goals: Good ADL Goals Pt Will Perform Grooming: with modified independence;standing Pt Will Perform Lower Body Dressing: with min assist;sit to/from stand Pt Will Transfer to Toilet: with supervision;ambulating;regular height toilet   OT Frequency:  Min 2X/week    Co-evaluation PT/OT/SLP Co-Evaluation/Treatment: Yes Reason for Co-Treatment: Complexity of the patient's impairments (multi-system involvement) PT goals addressed during session: Mobility/safety  with mobility OT goals addressed during session: ADL's and self-care      AM-PAC OT "6 Clicks" Daily Activity     Outcome Measure Help from another person eating meals?: None Help from another person taking care of personal grooming?: A Little Help from another person toileting, which includes using toliet, bedpan, or urinal?: A Lot Help from another person bathing (including washing, rinsing, drying)?: A Lot Help from another person to put on and taking off regular upper body clothing?: A Little Help from another person to put on and taking off regular lower body clothing?: A Lot 6 Click Score: 16   End of Session Equipment Utilized During Treatment: Gait belt;Rolling walker (2 wheels)  Activity Tolerance:  (Pt limited by dizziness) Patient left: in bed;with call bell/phone within reach;with bed alarm set;with family/visitor present  OT Visit Diagnosis: Unsteadiness on feet (R26.81);Muscle weakness (generalized) (M62.81);Dizziness and giddiness (R42)                Time: 4098-1191 OT Time Calculation (min): 26 min Charges:  OT General Charges $OT Visit: 1 Visit OT Evaluation $OT Eval Low Complexity: 1 Low OT Treatments $Self Care/Home Management : 8-22 mins  Doyal Saric  Jakie Mayhew, Student OT   [employer] 11/16/2023, 2:40 PM

## 2023-11-16 NOTE — Inpatient Diabetes Management (Signed)
 Inpatient Diabetes Program Recommendations  AACE/ADA: New Consensus Statement on Inpatient Glycemic Control (2015)  Target Ranges:  Prepandial:   less than 140 mg/dL      Peak postprandial:   less than 180 mg/dL (1-2 hours)      Critically ill patients:  140 - 180 mg/dL   Lab Results  Component Value Date   GLUCAP 158 (H) 11/16/2023   HGBA1C 7.6 (H) 11/15/2023   Spoke with patient and son @ bedside. Patient and son state patient has had lows of 60's in the evening and during the night but have not noticed <70 during the day and asymptomatic of hypoglycemia. Patient wears a CGM @ home and verbalizes hypoglycemia protocol.  Thank you, Dorla Guizar E. Caral Whan, RN, MSN, CDCES  Diabetes Coordinator Inpatient Glycemic Control Team Team Pager [phone] (8am-5pm) 11/16/2023 1:28 PM

## 2023-11-16 NOTE — Progress Notes (Addendum)
 pt states she doesn't feel well. Pt is shaky and reports that she was having chills. Pt's HR is a little tachy at 110-120's, other VSS. Temp is 98.6, per pt family this is a little high for her because they said she usually run Temp 97, explained to the family and pt that technically she not running a fever. Patient's urine smells strong and amber. they did a UA at admission, Patient is also nauseous, they did a KUB today. CBG is 152. patient came in for near syncope. Notified Cornelius Dill NP if we need to send another urine sample? Awaiting for response. No other concern at the moment. Plan of care continued.   Update: no need to send UA per provider and to try ativan  with patient's symptoms.

## 2023-11-16 NOTE — Plan of Care (Signed)

## 2023-11-16 NOTE — Inpatient Diabetes Management (Signed)
 Inpatient Diabetes Program Recommendations  AACE/ADA: New Consensus Statement on Inpatient Glycemic Control   Target Ranges:  Prepandial:   less than 140 mg/dL      Peak postprandial:   less than 180 mg/dL (1-2 hours)      Critically ill patients:  140 - 180 mg/dL    Latest Reference Range & Units 11/15/23 08:01 11/15/23 12:04 11/15/23 16:43 11/15/23 21:36 11/16/23 05:22  Glucose-Capillary 70 - 99 mg/dL 77 161 (H) 77 096 (H) 87    Latest Reference Range & Units 05/10/23 18:11 11/15/23 05:50  Hemoglobin A1C 4.8 - 5.6 % 6.5 (H) 7.6 (H)   Review of Glycemic Control  Diabetes history: DM2 Outpatient Diabetes medications: Glipizide  5 mg QAM, Lantus  10 units QAM, Ozempic 1 mg Qweek Current orders for Inpatient glycemic control: Semglee  10 units daily, Novolog  0-15 units TID with meals, Novolog  0-5 units QHS  Inpatient Diabetes Program Recommendations:    Insulin : CBGs 77-147 mg/dl on 0/45 and lab glucose 67 mg/dl this morning.  Please consider discontinuing Semglee .  Outpatient DM: Question if recurrent syncope related to hypoglycemia?  Will likely need to decrease DM medication regimen.   NOTE: Patient admitted with recurrent syncope. Question if syncope related to hypoglycemia? Patient sees [facility] and was seen on 08/23/23 by Milly Almas, PA. Per office note on 08/23/23, patient was told to start Ozempic 0.25 mg Qweek (if medication affordable; take 0.25 mg x4 weeks then increase to 0.5 mg Qweek) and stop Janumet  50-1000 mg BID (if Ozempic started). On 10/01/23, order for Ozempic 1 mg Qweek and Lantus  18 units daily were prescribed. Her A1C at that time was 6.1% and current A1C is 7.6%.  Given patient is 88 years old and to decrease risk of complications from hypoglycemia, may need higher A1C goal (such as 8%).   Patient was in ED on 10/27/23 with syncope and in the ED on 11/06/23 with syncope. Per Nurse note on 11/08/23, patient's Libre CGM was reviewed and she was BELOW RANGE  7% (recommended to be less than 1% so this is very high); patient was advised to decrease Lantus  to 8 units at bedtime, continue Ozempic 0.5 mg Qweek, and continue Glipizide  5 mg QAM. Patient was seen by PCP on 11/09/23 with syncope and then returned back to ED on 11/14/23 with syncope.    Thanks, Beacher Limerick, RN, MSN, CDCES Diabetes Coordinator Inpatient Diabetes Program [phone] (Team Pager from 8am to 5pm)

## 2023-11-16 NOTE — Evaluation (Signed)
 Physical Therapy Evaluation Patient Details Name: Brandi [name] MRN: [id number] DOB: December 08, 1935 Today's Date: 11/16/2023  History of Present Illness  Patient is a 88 year old female with near syncope and hypotension. History of insulin -dependent diabetes, atrial fibrillation and a flutter, not on anticoagulation, hypertension, hyperlipidemia, anxiety, depression, CAD status post CABG.  Clinical Impression  Patient is agreeable to PT evaluation. She reports her son is her caregiver at home and he was also in the room throughout session. She is ambulatory with 4 wheeled walker at baseline and son helps with ADLs.  Today the patient reports not feeling well, needing to have a bowel movement but unable to go. She was able to stand several times with CGA using rolling walker. Limited standing tolerance with mild dizziness reported. No significant drop in blood pressure is noted with activity. Unable to progress walking this date and patient reports nausea after mobilizing. Recommend to continue PT to maximize independence and decrease caregiver burden.       If plan is discharge home, recommend the following: A little help with walking and/or transfers;A little help with bathing/dressing/bathroom;Help with stairs or ramp for entrance;Assistance with cooking/housework   Can travel by private vehicle        Equipment Recommendations None recommended by PT  Recommendations for Other Services       Functional Status Assessment Patient has had a recent decline in their functional status and demonstrates the ability to make significant improvements in function in a reasonable and predictable amount of time.     Precautions / Restrictions Precautions Precautions: Fall Restrictions Weight Bearing Restrictions Per Provider Order: No      Mobility  Bed Mobility Overal bed mobility: Needs Assistance Bed Mobility: Sit to Supine       Sit to supine: Min assist   General bed mobility comments:  assistance for LE support. patient reports nausea with return to bed    Transfers Overall transfer level: Needs assistance Equipment used: Rolling walker (2 wheels) Transfers: Sit to/from Stand, Bed to chair/wheelchair/BSC Sit to Stand: Contact guard assist   Step pivot transfers: Contact guard assist       General transfer comment: several transfers performed this session. CGA for safety    Ambulation/Gait               General Gait Details: patient declined due to feeling poorly with standing. blood pressure monitored throughout session  Stairs            Wheelchair Mobility     Tilt Bed    Modified Rankin (Stroke Patients Only)       Balance Overall balance assessment: Needs assistance Sitting-balance support: Feet supported Sitting balance-Leahy Scale: Fair     Standing balance support: Bilateral upper extremity supported Standing balance-Leahy Scale: Poor Standing balance comment: heavy reliance on rolling walker for support in standing                             Pertinent Vitals/Pain Pain Assessment Pain Assessment: 0-10 Pain Score: 3  Pain Location: generalized Pain Descriptors / Indicators: Discomfort Pain Intervention(s): Monitored during session, Limited activity within patient's tolerance, Repositioned    Home Living Family/patient expects to be discharged to:: Private residence Living Arrangements: Children (son) Available Help at Discharge: Family;Available 24 hours/day Type of Home: House Home Access: Stairs to enter Entrance Stairs-Rails: Right Entrance Stairs-Number of Steps: 4   Home Layout: Two level;Able to live on main level  with bedroom/bathroom Home Equipment: Rollator (4 wheels);Shower seat      Prior Function Prior Level of Function : Working/employed             Mobility Comments: rollator used for ambulation ADLs Comments: son assists with ADLs     Extremity/Trunk Assessment   Upper  Extremity Assessment Upper Extremity Assessment: Defer to OT evaluation    Lower Extremity Assessment Lower Extremity Assessment: Generalized weakness       Communication   Communication Communication: No apparent difficulties    Cognition Arousal: Alert Behavior During Therapy: WFL for tasks assessed/performed   PT - Cognitive impairments: Initiation                         Following commands: Impaired Following commands impaired: Follows one step commands with increased time     Cueing Cueing Techniques: Verbal cues     General Comments General comments (skin integrity, edema, etc.): patient on bed side commode on arrival to room but was unable to use the bathroom. she does report nausea after mobilizing and mild dizziness with activity. son present and very involved with care as he is the primary caregiver at home.    Exercises     Assessment/Plan    PT Assessment Patient needs continued PT services  PT Problem List Decreased strength;Decreased activity tolerance;Decreased range of motion;Decreased balance;Decreased mobility       PT Treatment Interventions DME instruction;Gait training;Stair training;Functional mobility training;Therapeutic activities;Balance training;Therapeutic exercise;Neuromuscular re-education;Cognitive remediation;Patient/family education    PT Goals (Current goals can be found in the Care Plan section)  Acute Rehab PT Goals Patient Stated Goal: to feel better PT Goal Formulation: With patient Time For Goal Achievement: 11/30/23 Potential to Achieve Goals: Fair    Frequency Min 2X/week     Co-evaluation PT/OT/SLP Co-Evaluation/Treatment: Yes Reason for Co-Treatment: Complexity of the patient's impairments (multi-system involvement) PT goals addressed during session: Mobility/safety with mobility         AM-PAC PT "6 Clicks" Mobility  Outcome Measure Help needed turning from your back to your side while in a flat bed  without using bedrails?: A Little Help needed moving from lying on your back to sitting on the side of a flat bed without using bedrails?: A Little Help needed moving to and from a bed to a chair (including a wheelchair)?: A Little Help needed standing up from a chair using your arms (e.g., wheelchair or bedside chair)?: A Little Help needed to walk in hospital room?: A Little Help needed climbing 3-5 steps with a railing? : A Lot 6 Click Score: 17    End of Session Equipment Utilized During Treatment: Gait belt Activity Tolerance: Patient limited by fatigue Patient left: in bed;with call bell/phone within reach;with bed alarm set;with family/visitor present Nurse Communication: Mobility status (patient requesting miralax ) PT Visit Diagnosis: Muscle weakness (generalized) (M62.81);Unsteadiness on feet (R26.81)    Time: 1323-1350 PT Time Calculation (min) (ACUTE ONLY): 27 min   Charges:   PT Evaluation $PT Eval Moderate Complexity: 1 Mod   PT General Charges $$ ACUTE PT VISIT: 1 Visit        Ozie Bo, PT, MPT  Erlene Hawks 11/16/2023, 2:10 PM

## 2023-11-16 NOTE — Discharge Summary (Addendum)
 Addendum: Discharge canceled.  On presentation of discharge papers patient endorsed constipation and requested an enema.  She received enema and had small but not significant bowel movement.  KUB reveals moderate stool burden.  We discussed outpatient constipation regimen, the patient endorsed increasing nausea and preferred to stay in house to resolve problem.  She reports last bowel movement was 2 weeks prior to arrival    Physician Discharge Summary   Patient: Brandi [name] MRN: [id number] DOB: 1936-02-08  Admit date:     11/14/2023  Discharge date: 11/16/23  Discharge Physician: Roise Cleaver   PCP: Little Riff, MD   Recommendations at discharge:   Follow-up closely with primary care for diabetes and blood pressure management.  Discharge Diagnoses: Principal Problem:   Syncope Active Problems:   Recurrent syncope   S/P CABG x 3   Hyperlipidemia   Coronary arteriosclerosis   Depression, major, in remission (HCC)   Diabetic neuropathy (HCC)   OSA on CPAP   Diabetes mellitus type 2, insulin  dependent (HCC)   Anxiety   Presence of drug coated stent in LAD coronary artery   Chronic pain syndrome   Essential hypertension  Resolved Problems:   HTN (hypertension), malignant  Hospital Course: Brandi [name] is an 88 year old female with insulin -dependent diabetes, atrial fibrillation and a flutter, not on anticoagulation, hypertension, hyperlipidemia, anxiety, depression, CAD status post CABG times 08/2017 who presents to the ED with near syncopal episode with associated hypotension.  Patient has had multiple prior ED evaluations for similar events.  On arrival to the ED this admission vital signs are stable.  Lab work is largely unremarkable. On further evaluation appears the patient is on multiple antihypertensives.  She was found to be profoundly orthostatic with > 100 mmHg drop and subsequent tachycardia with position changes.  Her blood pressure medications were de-escalated,  TED hose applied, and she had some improvement in her orthostasis.  No further syncopal episodes while admitted. She was also monitored on telemetry without any acute events.  Workup otherwise unremarkable including EEG, brain MRI, echocardiogram. We also discussed extensively her diabetes regimen.  Most recently patient's hemoglobin A1c is 6.1% and she was started on Ozempic which she has been gradually increasing.  She reports on review of her CGM she has consistent lows into the 60s.  We discussed that Ozempic is a poor choice for her given her comorbidities and prolonged washout period making it difficult to treat and follow her hypoglycemic episodes.  We also recommend holding her sulfonylurea which could also be contributing to hypoglycemic episodes.  We have decreased her daily glargine. She endorses understanding.  I recommend she follow-up with her endocrinologist to discuss alternative medication options.  Repeat hemoglobin A1c here reveals 7.6%, which may be a more appropriate goal for this patient given her age and comorbidities. On 5/30 patient reported she was feeling much better and was requesting for discharge home.  I discussed multiple medication changes with her son at bedside and he is in agreement.   Syncope - Likely secondary to profound orthostasis +/- hypoglycemia.  Orthostatic vital signs reveal lying blood pressure: 222/68, pulse 77.  Standing blood pressure 117/80, pulse 101. - Suspect polypharmacy is playing a role.   - Repeat orthostatics improved today after medication de-escalation - Echocardiogram: LVEF 60 to 65%, no regional wall motion abnormalities, mild left ventricular hypertrophy, grade 1 diastolic dysfunction.  Mild mitral valve regurg, no aortic stenosis --EKG on arrival: Sinus rhythm, low voltage.  No obvious arrhythmia or  ST changes - High-sensitivity troponin: 6 -No events on telemetry - Brain MRI: Extensive chronic small vessel ischemic disease, enlarged empty  sella unchanged from prior exams. - EEG: WNL -PT/OT Home health ordered   Orthostatic hypotension - Lying blood pressure is extremely elevated, SBP consistently above 180.  When standing has >147mmg hg drop. -De-escalated antihypertensives at discharge - Repeat orthostatics are improved - Continue to wear TED hose   Polypharmacy -Patient is taking multiple antihistamines, and multiple antihypertensives.  Home medication list includes: Pepcid , meclizine , loratadine , Myrbetriq . Coreg , gabapentin  400 mg 3 times daily, losartan , verapamil , hydralazine  -Have deprescribed at discharge - Decrease dose of gabapentin , consider complete taper outpatient   Hypertension -Have de-escalated multiple medications at discharge.  Discussed with patient.  Chronic pain syndrome - PDMP reviewed - Currently on gabapentin  400 mg 3 times daily, given concerns of syncope we will resume this at lower dose   Anxiety - Home meds    Type 2 diabetes, insulin -dependent -Hemoglobin A1c 7.6%.  Patient currently taking sulfonylurea and Ozempic.  Is having hypoglycemic episodes at home, reports her CBG shows consistent lows in the 60s - Discontinue Ozempic and glimepiride.  Decrease home dose glargine.  Follow-up closely with endocrinology   CAD status post CABGx3, 2019 - Resume aspirin , statin.  Antihypertensive control as above   Atrial fibrillation, not on anticoagulation - Gradually resuming home meds as above.  Continue with lower dose Coreg  for now.  Currently rate controlled.  Consultants: n/a Procedures performed: n/a  Disposition: Home health Diet recommendation:  Regular diet DISCHARGE MEDICATION: Allergies as of 11/16/2023       Reactions   Accupril [quinapril Hcl] Rash   Micardis [telmisartan] Other (See Comments)   Dizziness   Quinapril Rash        Medication List     STOP taking these medications    famotidine  20 MG tablet Commonly known as: PEPCID    glipiZIDE  5 MG  tablet Commonly known as: GLUCOTROL    hydrALAZINE  10 MG tablet Commonly known as: APRESOLINE    loratadine  10 MG tablet Commonly known as: CLARITIN    meclizine  12.5 MG tablet Commonly known as: ANTIVERT    Ozempic (1 MG/DOSE) 4 MG/3ML Sopn Generic drug: Semaglutide (1 MG/DOSE)   Verapamil  HCl CR 300 MG Cp24       TAKE these medications    acetaminophen  500 MG tablet Commonly known as: TYLENOL  Take 1,000 mg by mouth 3 (three) times daily.   aspirin  EC 81 MG tablet Take 81 mg by mouth daily.   atorvastatin  40 MG tablet Commonly known as: LIPITOR Take 40 mg by mouth every evening.   budesonide-formoterol  80-4.5 MCG/ACT inhaler Commonly known as: SYMBICORT Inhale 2 puffs into the lungs 2 (two) times daily.   CALCIUM  CARBONATE+VITAMIN D  PO Take 1 tablet by mouth daily at 6 (six) AM.   carboxymethylcellulose 0.5 % Soln Commonly known as: REFRESH PLUS Apply 1-2 drops to eye at bedtime.   carvedilol  25 MG tablet Commonly known as: COREG  Take 0.5 tablets (12.5 mg total) by mouth 2 (two) times daily with a meal.   ferrous sulfate  325 (65 FE) MG tablet Take 325 mg by mouth daily with breakfast.   gabapentin  300 MG capsule Commonly known as: NEURONTIN  Take 1 capsule (300 mg total) by mouth 3 (three) times daily. What changed:  medication strength how much to take   Lantus  SoloStar 100 UNIT/ML Solostar Pen Generic drug: insulin  glargine Inject 5 Units into the skin every morning. What changed: how much to take  losartan  100 MG tablet Commonly known as: COZAAR  Take 100 mg by mouth at bedtime.   Melatonin 10 MG Tabs Take 1 tablet by mouth at bedtime.   mirabegron  ER 50 MG Tb24 tablet Commonly known as: MYRBETRIQ  Take 1 tablet (50 mg total) by mouth daily.   nitroGLYCERIN  0.4 MG SL tablet Commonly known as: NITROSTAT  Place 0.4 mg under the tongue every 5 (five) minutes as needed for chest pain.   PreserVision/Lutein  Caps Take 1 capsule by mouth 2 (two)  times daily.   progesterone  100 MG capsule Commonly known as: PROMETRIUM  Take 100 mg by mouth at bedtime.   sodium chloride  0.65 % nasal spray Commonly known as: OCEAN Place 1 spray into the nose as needed.   VITAMIN B-12 PO Take 1 tablet by mouth daily.        Discharge Exam: Filed Weights   11/14/23 1152 11/16/23 0517  Weight: 85.7 kg 83.9 kg   Constitutional:  Normal appearance. Non toxic-appearing.  HENT: Head Normocephalic and atraumatic.  Mucous membranes are moist.  Eyes:  Extraocular intact. Conjunctivae normal. Pupils are equal, round, and reactive to light.  Cardiovascular: Rate and Rhythm: Normal rate and regular rhythm.  Pulmonary: Non labored, symmetric rise of chest wall.  Musculoskeletal:  Normal range of motion.  Skin: warm and dry. not jaundiced.  Neurological: No focal deficit present. alert. Oriented. Psychiatric: Mood and Affect congruent.    Condition at discharge: stable  The results of significant diagnostics from this hospitalization (including imaging, microbiology, ancillary and laboratory) are listed below for reference.   Imaging Studies: ECHOCARDIOGRAM COMPLETE Result Date: 11/15/2023    ECHOCARDIOGRAM REPORT   Patient Name:   Brandi [name] Date of Exam: 11/14/2023 Medical Rec #:  [id number]     Height:       61.0 in Accession #:    [id number]    Weight:       188.9 lb Date of Birth:  04-May-1936     BSA:          1.844 m Patient Age:    87 years      BP:           146/54 mmHg Patient Gender: F             HR:           99 bpm. Exam Location:  [facility] Procedure: 2D Echo, Cardiac Doppler and Color Doppler (Both Spectral and Color            Flow Doppler were utilized during procedure). Indications:     R55 Syncope  History:         Patient has prior history of Echocardiogram examinations, most                  recent 05/11/2023. CAD and NSTEMI., Prior CABG,                  Arrythmias:Atrial Fibrillation and Atrial Flutter,                   Signs/Symptoms:Syncope, Dyspnea and Murmur; Risk                  Factors:Hypertension and Dyslipidemia. Obstructive sleep                  apnea-CPAP.  Sonographer:     Brigid Canada RDCS Referring Phys:  [id number] AMY N COX Diagnosing Phys: Percival Brace MD IMPRESSIONS  1. Left ventricular ejection fraction, by  estimation, is 60 to 65%. The left ventricle has normal function. The left ventricle has no regional wall motion abnormalities. There is mild left ventricular hypertrophy. Left ventricular diastolic parameters are consistent with Grade I diastolic dysfunction (impaired relaxation).  2. Right ventricular systolic function is normal. The right ventricular size is normal.  3. The mitral valve is normal in structure. Mild mitral valve regurgitation. No evidence of mitral stenosis.  4. The aortic valve is normal in structure. Aortic valve regurgitation is not visualized. No aortic stenosis is present.  5. The inferior vena cava is normal in size with greater than 50% respiratory variability, suggesting right atrial pressure of 3 mmHg. FINDINGS  Left Ventricle: Left ventricular ejection fraction, by estimation, is 60 to 65%. The left ventricle has normal function. The left ventricle has no regional wall motion abnormalities. Strain was performed and the global longitudinal strain is indeterminate. The left ventricular internal cavity size was normal in size. There is mild left ventricular hypertrophy. Left ventricular diastolic parameters are consistent with Grade I diastolic dysfunction (impaired relaxation). Right Ventricle: The right ventricular size is normal. No increase in right ventricular wall thickness. Right ventricular systolic function is normal. Left Atrium: Left atrial size was normal in size. Right Atrium: Right atrial size was normal in size. Pericardium: There is no evidence of pericardial effusion. Mitral Valve: The mitral valve is normal in structure. Mild mitral valve  regurgitation. No evidence of mitral valve stenosis. Tricuspid Valve: The tricuspid valve is normal in structure. Tricuspid valve regurgitation is mild . No evidence of tricuspid stenosis. The aortic valve is normal in structure. Aortic valve regurgitation is not visualized. No aortic stenosis is present. Pulmonic Valve: The pulmonic valve was normal in structure. Pulmonic valve regurgitation is not visualized. No evidence of pulmonic stenosis. Aorta: The aortic root is normal in size and structure. Venous: The inferior vena cava is normal in size with greater than 50% respiratory variability, suggesting right atrial pressure of 3 mmHg. IAS/Shunts: No atrial level shunt detected by color flow Doppler. Additional Comments: 3D was performed not requiring image post processing on an independent workstation and was indeterminate.  LEFT VENTRICLE PLAX 2D LVIDd:         3.70 cm   Diastology LVIDs:         2.60 cm   LV e' medial:    5.40 cm/s LV PW:         1.30 cm   LV E/e' medial:  16.4 LV IVS:        1.20 cm   LV e' lateral:   7.87 cm/s LVOT diam:     1.90 cm   LV E/e' lateral: 11.3 LV SV:         41 LV SV Index:   22 LVOT Area:     2.84 cm  RIGHT VENTRICLE RV Basal diam:  3.30 cm RV S prime:     7.81 cm/s TAPSE (M-mode): 2.3 cm LEFT ATRIUM             Index        RIGHT ATRIUM           Index LA diam:        4.30 cm 2.33 cm/m   RA Area:     13.40 cm LA Vol (A2C):   42.3 ml 22.94 ml/m  RA Volume:   33.10 ml  17.95 ml/m LA Vol (A4C):   37.6 ml 20.39 ml/m LA Biplane Vol: 41.8 ml 22.67 ml/m  AORTIC VALVE AV Area (Vmax):    1.23 cm AV Area (Vmean):   1.20 cm AV Area (VTI):     1.32 cm AV Vmax:           190.00 cm/s AV Vmean:          131.333 cm/s AV VTI:            0.313 m AV Peak Grad:      14.4 mmHg AV Mean Grad:      8.0 mmHg LVOT Vmax:         82.70 cm/s LVOT Vmean:        55.440 cm/s LVOT VTI:          0.145 m LVOT/AV VTI ratio: 0.46  AORTA Ao Root diam: 2.80 cm Ao Asc diam:  2.80 cm MITRAL VALVE MV Area  (PHT): 4.50 cm     SHUNTS MV Decel Time: 169 msec     Systemic VTI:  0.15 m MV E velocity: 88.57 cm/s   Systemic Diam: 1.90 cm MV A velocity: 130.33 cm/s MV E/A ratio:  0.68 Percival Brace MD Electronically signed by Percival Brace MD Signature Date/Time: 11/15/2023/1:23:32 PM    Final    EEG adult Result Date: 11/15/2023 Arleene Lack, MD     11/15/2023  8:39 AM Patient Name: Brandi [name] MRN: [id number] Epilepsy Attending: Arleene Lack Referring Physician/Provider: Cedric Cohn, DO Date: 11/14/2023 Duration: 26.43 mins Patient history: 88yo F with syncope. EEG to evaluate fro seizure Level of alertness: Awake AEDs during EEG study: None Technical aspects: This EEG study was done with scalp electrodes positioned according to the 10-20 International system of electrode placement. Electrical activity was reviewed with band pass filter of 1-70Hz , sensitivity of 7 uV/mm, display speed of 45mm/sec with a 60Hz  notched filter applied as appropriate. EEG data were recorded continuously and digitally stored.  Video monitoring was available and reviewed as appropriate. Description: The posterior dominant rhythm consists of 8-9 Hz activity of moderate voltage (25-35 uV) seen predominantly in posterior head regions, symmetric and reactive to eye opening and eye closing. Hyperventilation and photic stimulation were not performed.   IMPRESSION: This study is within normal limits. No seizures or epileptiform discharges were seen throughout the recording. A normal interictal EEG does not exclude the diagnosis of epilepsy. Arleene Lack   MR BRAIN WO CONTRAST Result Date: 11/14/2023 CLINICAL DATA:  Neuro deficit, acute, stroke suspected. Multiple syncope, near syncope events over the last 2 months. EXAM: MRI HEAD WITHOUT CONTRAST TECHNIQUE: Multiplanar, multiecho pulse sequences of the brain and surrounding structures were obtained without intravenous contrast. COMPARISON:  Head MRI 05/10/2023 FINDINGS:  Brain: There is no evidence of an acute infarct, intracranial hemorrhage, mass, midline shift, or extra-axial fluid collection. Patchy to confluent T2 hyperintensities in the cerebral white matter bilaterally and in the pons are stable to mildly increased and nonspecific but compatible with extensive chronic small vessel ischemic disease. There is mild generalized cerebral atrophy. An enlarged, empty sella is unchanged. Vascular: Major intracranial vascular flow voids are preserved. Skull and upper cervical spine: Unremarkable bone marrow signal. Sinuses/Orbits: Bilateral cataract extraction. Mild mucosal thickening in the right sphenoid and left maxillary sinuses. No significant mastoid fluid. Other: None. IMPRESSION: 1. No acute intracranial abnormality. 2. Extensive chronic small vessel ischemic disease. Electronically Signed   By: Aundra Lee M.D.   On: 11/14/2023 19:00   Portable Chest 1 View Result Date: 11/14/2023 CLINICAL DATA:  Near-syncope this morning. EXAM: PORTABLE  CHEST 1 VIEW COMPARISON:  09/19/2023 FINDINGS: Normal sized heart. Stable loop recorder. Stable post CABG changes and left atrial clip. Clear lungs with normal vascularity. Tortuous and partially calcified thoracic aorta. Diffuse osteopenia. IMPRESSION: No active disease. Electronically Signed   By: Catherin Closs M.D.   On: 11/14/2023 15:50   DG PAIN CLINIC C-ARM 1-60 MIN NO REPORT Result Date: 11/13/2023 Fluoro was used, but no Radiologist interpretation will be provided. Please refer to "NOTES" tab for provider progress note.   Microbiology: Results for orders placed or performed during the hospital encounter of 09/19/23  Resp panel by RT-PCR (RSV, Flu A&B, Covid) Anterior Nasal Swab     Status: None   Collection Time: 09/19/23  9:51 PM   Specimen: Anterior Nasal Swab  Result Value Ref Range Status   SARS Coronavirus 2 by RT PCR NEGATIVE NEGATIVE Final    Comment: (NOTE) SARS-CoV-2 target nucleic acids are NOT  DETECTED.  The SARS-CoV-2 RNA is generally detectable in upper respiratory specimens during the acute phase of infection. The lowest concentration of SARS-CoV-2 viral copies this assay can detect is 138 copies/mL. A negative result does not preclude SARS-Cov-2 infection and should not be used as the sole basis for treatment or other patient management decisions. A negative result may occur with  improper specimen collection/handling, submission of specimen other than nasopharyngeal swab, presence of viral mutation(s) within the areas targeted by this assay, and inadequate number of viral copies(<138 copies/mL). A negative result must be combined with clinical observations, patient history, and epidemiological information. The expected result is Negative.  Fact Sheet for Patients:  BloggerCourse.com  Fact Sheet for Healthcare Providers:  SeriousBroker.it  This test is no t yet approved or cleared by the [place]  FDA and  has been authorized for detection and/or diagnosis of SARS-CoV-2 by FDA under an Emergency Use Authorization (EUA). This EUA will remain  in effect (meaning this test can be used) for the duration of the COVID-19 declaration under Section 564(b)(1) of the Act, 21 U.S.C.section 360bbb-3(b)(1), unless the authorization is terminated  or revoked sooner.       Influenza A by PCR NEGATIVE NEGATIVE Final   Influenza B by PCR NEGATIVE NEGATIVE Final    Comment: (NOTE) The Xpert Xpress SARS-CoV-2/FLU/RSV plus assay is intended as an aid in the diagnosis of influenza from Nasopharyngeal swab specimens and should not be used as a sole basis for treatment. Nasal washings and aspirates are unacceptable for Xpert Xpress SARS-CoV-2/FLU/RSV testing.  Fact Sheet for Patients: BloggerCourse.com  Fact Sheet for Healthcare Providers: SeriousBroker.it  This test is not yet  approved or cleared by the [place]  FDA and has been authorized for detection and/or diagnosis of SARS-CoV-2 by FDA under an Emergency Use Authorization (EUA). This EUA will remain in effect (meaning this test can be used) for the duration of the COVID-19 declaration under Section 564(b)(1) of the Act, 21 U.S.C. section 360bbb-3(b)(1), unless the authorization is terminated or revoked.     Resp Syncytial Virus by PCR NEGATIVE NEGATIVE Final    Comment: (NOTE) Fact Sheet for Patients: BloggerCourse.com  Fact Sheet for Healthcare Providers: SeriousBroker.it  This test is not yet approved or cleared by the [place]  FDA and has been authorized for detection and/or diagnosis of SARS-CoV-2 by FDA under an Emergency Use Authorization (EUA). This EUA will remain in effect (meaning this test can be used) for the duration of the COVID-19 declaration under Section 564(b)(1) of the Act, 21 U.S.C. section 360bbb-3(b)(1), unless the  authorization is terminated or revoked.  Performed at [facility], [address] [place], [place] [address]     Labs: CBC: Recent Labs  Lab 11/14/23 1148 11/15/23 0550 11/16/23 0454  WBC 8.6 6.6 6.5  NEUTROABS 5.3  --  3.0  HGB 10.8* 11.3* 10.7*  HCT 34.5* 34.8* 32.8*  MCV 92.0 89.2 88.4  PLT 228 238 224   Basic Metabolic Panel: Recent Labs  Lab 11/14/23 1148 11/15/23 0550 11/16/23 0454  NA 138 140 138  K 3.4* 3.6 3.2*  CL 107 107 106  CO2 25 28 25   GLUCOSE 123* 87 67*  BUN 12 11 12   CREATININE 0.80 0.70 0.62  CALCIUM  7.5* 8.1* 7.9*  MG  --   --  1.7  PHOS  --   --  3.2   Liver Function Tests: Recent Labs  Lab 11/16/23 0454  AST 18  ALT 21  ALKPHOS 77  BILITOT 0.6  PROT 4.0*  ALBUMIN 1.9*   CBG: Recent Labs  Lab 11/15/23 1643 11/15/23 2136 11/16/23 0522 11/16/23 0754 11/16/23 1112  GLUCAP 77 108* 87 106* 158*    Discharge time spent: 31  minutes.  Signed: Jamonte Curfman, DO [facility] 11/16/2023

## 2023-11-17 ENCOUNTER — Observation Stay

## 2023-11-17 DIAGNOSIS — Z7982 Long term (current) use of aspirin: Secondary | ICD-10-CM | POA: Diagnosis not present

## 2023-11-17 DIAGNOSIS — Z79899 Other long term (current) drug therapy: Secondary | ICD-10-CM | POA: Diagnosis not present

## 2023-11-17 DIAGNOSIS — R6510 Systemic inflammatory response syndrome (SIRS) of non-infectious origin without acute organ dysfunction: Secondary | ICD-10-CM | POA: Diagnosis present

## 2023-11-17 DIAGNOSIS — Z7984 Long term (current) use of oral hypoglycemic drugs: Secondary | ICD-10-CM | POA: Diagnosis not present

## 2023-11-17 DIAGNOSIS — E669 Obesity, unspecified: Secondary | ICD-10-CM | POA: Diagnosis present

## 2023-11-17 DIAGNOSIS — N390 Urinary tract infection, site not specified: Secondary | ICD-10-CM | POA: Diagnosis present

## 2023-11-17 DIAGNOSIS — E876 Hypokalemia: Secondary | ICD-10-CM | POA: Diagnosis present

## 2023-11-17 DIAGNOSIS — F325 Major depressive disorder, single episode, in full remission: Secondary | ICD-10-CM | POA: Diagnosis present

## 2023-11-17 DIAGNOSIS — F419 Anxiety disorder, unspecified: Secondary | ICD-10-CM | POA: Diagnosis present

## 2023-11-17 DIAGNOSIS — B9620 Unspecified Escherichia coli [E. coli] as the cause of diseases classified elsewhere: Secondary | ICD-10-CM | POA: Diagnosis present

## 2023-11-17 DIAGNOSIS — I951 Orthostatic hypotension: Secondary | ICD-10-CM | POA: Diagnosis present

## 2023-11-17 DIAGNOSIS — R55 Syncope and collapse: Secondary | ICD-10-CM | POA: Diagnosis present

## 2023-11-17 DIAGNOSIS — G894 Chronic pain syndrome: Secondary | ICD-10-CM | POA: Diagnosis present

## 2023-11-17 DIAGNOSIS — Z8249 Family history of ischemic heart disease and other diseases of the circulatory system: Secondary | ICD-10-CM | POA: Diagnosis not present

## 2023-11-17 DIAGNOSIS — I2510 Atherosclerotic heart disease of native coronary artery without angina pectoris: Secondary | ICD-10-CM | POA: Diagnosis present

## 2023-11-17 DIAGNOSIS — E1140 Type 2 diabetes mellitus with diabetic neuropathy, unspecified: Secondary | ICD-10-CM | POA: Diagnosis present

## 2023-11-17 DIAGNOSIS — E785 Hyperlipidemia, unspecified: Secondary | ICD-10-CM | POA: Diagnosis present

## 2023-11-17 DIAGNOSIS — Z7985 Long-term (current) use of injectable non-insulin antidiabetic drugs: Secondary | ICD-10-CM | POA: Diagnosis not present

## 2023-11-17 DIAGNOSIS — Z7951 Long term (current) use of inhaled steroids: Secondary | ICD-10-CM | POA: Diagnosis not present

## 2023-11-17 DIAGNOSIS — E11649 Type 2 diabetes mellitus with hypoglycemia without coma: Secondary | ICD-10-CM | POA: Diagnosis present

## 2023-11-17 DIAGNOSIS — I340 Nonrheumatic mitral (valve) insufficiency: Secondary | ICD-10-CM | POA: Diagnosis present

## 2023-11-17 DIAGNOSIS — I4891 Unspecified atrial fibrillation: Secondary | ICD-10-CM | POA: Diagnosis present

## 2023-11-17 DIAGNOSIS — I4892 Unspecified atrial flutter: Secondary | ICD-10-CM | POA: Diagnosis present

## 2023-11-17 DIAGNOSIS — Z794 Long term (current) use of insulin: Secondary | ICD-10-CM | POA: Diagnosis not present

## 2023-11-17 DIAGNOSIS — I10 Essential (primary) hypertension: Secondary | ICD-10-CM | POA: Diagnosis present

## 2023-11-17 DIAGNOSIS — G4733 Obstructive sleep apnea (adult) (pediatric): Secondary | ICD-10-CM | POA: Diagnosis present

## 2023-11-17 LAB — COMPREHENSIVE METABOLIC PANEL WITH GFR
ALT: 24 U/L (ref 0–44)
AST: 19 U/L (ref 15–41)
Albumin: 2.1 g/dL — ABNORMAL LOW (ref 3.5–5.0)
Alkaline Phosphatase: 86 U/L (ref 38–126)
Anion gap: 10 (ref 5–15)
BUN: 11 mg/dL (ref 8–23)
CO2: 22 mmol/L (ref 22–32)
Calcium: 7.8 mg/dL — ABNORMAL LOW (ref 8.9–10.3)
Chloride: 105 mmol/L (ref 98–111)
Creatinine, Ser: 0.75 mg/dL (ref 0.44–1.00)
GFR, Estimated: >60 mL/min (ref >60)
Glucose, Bld: 158 mg/dL — ABNORMAL HIGH (ref 70–99)
Potassium: 3.6 mmol/L (ref 3.5–5.1)
Sodium: 137 mmol/L (ref 135–145)
Total Bilirubin: 1 mg/dL (ref 0.0–1.2)
Total Protein: 4.5 g/dL — ABNORMAL LOW (ref 6.5–8.1)

## 2023-11-17 LAB — LACTIC ACID, PLASMA
Lactic Acid, Venous: 1 mmol/L (ref 0.5–1.9)
Lactic Acid, Venous: 1.1 mmol/L (ref 0.5–1.9)

## 2023-11-17 LAB — CBC WITH DIFFERENTIAL/PLATELET
Abs Immature Granulocytes: 0.07 10*3/uL (ref 0.00–0.07)
Basophils Absolute: 0 10*3/uL (ref 0.0–0.1)
Basophils Relative: 0 %
Eosinophils Absolute: 0 10*3/uL (ref 0.0–0.5)
Eosinophils Relative: 0 %
HCT: 35.7 % — ABNORMAL LOW (ref 36.0–46.0)
Hemoglobin: 11.9 g/dL — ABNORMAL LOW (ref 12.0–15.0)
Immature Granulocytes: 1 %
Lymphocytes Relative: 9 %
Lymphs Abs: 1.4 10*3/uL (ref 0.7–4.0)
MCH: 29.1 pg (ref 26.0–34.0)
MCHC: 33.3 g/dL (ref 30.0–36.0)
MCV: 87.3 fL (ref 80.0–100.0)
Monocytes Absolute: 0.9 10*3/uL (ref 0.1–1.0)
Monocytes Relative: 6 %
Neutro Abs: 12.9 10*3/uL — ABNORMAL HIGH (ref 1.7–7.7)
Neutrophils Relative %: 84 %
Platelets: 256 10*3/uL (ref 150–400)
RBC: 4.09 MIL/uL (ref 3.87–5.11)
RDW: 16.1 % — ABNORMAL HIGH (ref 11.5–15.5)
WBC: 15.3 10*3/uL — ABNORMAL HIGH (ref 4.0–10.5)
nRBC: 0 % (ref 0.0–0.2)

## 2023-11-17 LAB — GLUCOSE, CAPILLARY
Glucose-Capillary: 169 mg/dL — ABNORMAL HIGH (ref 70–99)
Glucose-Capillary: 166 mg/dL — ABNORMAL HIGH (ref 70–99)
Glucose-Capillary: 141 mg/dL — ABNORMAL HIGH (ref 70–99)
Glucose-Capillary: 73 mg/dL (ref 70–99)
Glucose-Capillary: 189 mg/dL — ABNORMAL HIGH (ref 70–99)

## 2023-11-17 LAB — URINALYSIS, ROUTINE W REFLEX MICROSCOPIC
Bilirubin Urine: NEGATIVE
Glucose, UA: NEGATIVE mg/dL
Hgb urine dipstick: NEGATIVE
Ketones, ur: 5 mg/dL — AB
Nitrite: NEGATIVE
Protein, ur: NEGATIVE mg/dL
Specific Gravity, Urine: 1.014 (ref 1.005–1.030)
WBC, UA: >50 WBC/hpf (ref 0–5)
pH: 5 (ref 5.0–8.0)

## 2023-11-17 LAB — MAGNESIUM: Magnesium: 1.7 mg/dL (ref 1.7–2.4)

## 2023-11-17 LAB — PHOSPHORUS: Phosphorus: 4.3 mg/dL (ref 2.5–4.6)

## 2023-11-17 LAB — D-DIMER, QUANTITATIVE: D-Dimer, Quant: 1.21 ug{FEU}/mL — ABNORMAL HIGH (ref 0.00–0.50)

## 2023-11-17 MED ORDER — IOHEXOL 350 MG/ML SOLN
75.0000 mL | Freq: Once | INTRAVENOUS | Status: AC | PRN
  Administered 2023-11-17: 75 mL via INTRAVENOUS

## 2023-11-17 MED ORDER — CARVEDILOL 25 MG PO TABS
25.0000 mg | ORAL_TABLET | Freq: Two times a day (BID) | ORAL | Status: DC
  Administered 2023-11-17 – 2023-11-19 (×5): 25 mg via ORAL
  Filled 2023-11-17 (×5): qty 1

## 2023-11-17 MED ORDER — SENNOSIDES-DOCUSATE SODIUM 8.6-50 MG PO TABS
2.0000 | ORAL_TABLET | Freq: Once | ORAL | Status: AC
  Administered 2023-11-17: 2 via ORAL
  Filled 2023-11-17: qty 2

## 2023-11-17 MED ORDER — SODIUM CHLORIDE 0.9 % IV SOLN
1.0000 g | INTRAVENOUS | Status: DC
  Administered 2023-11-17: 1 g via INTRAVENOUS
  Filled 2023-11-17 (×2): qty 10

## 2023-11-17 NOTE — Progress Notes (Signed)
 Ashley Lawrence NP about patient's HR sustaining to 120's throughout the shift and a low grade temp this morning of 99.1. NP ordered to check temp rectally and is 100.6, provider also order for EKG, CXR, labs and to send UA via in and out. No other concern at the moment. Plan of care continued.

## 2023-11-17 NOTE — Plan of Care (Signed)
  Problem: Clinical Measurements: Goal: Ability to maintain clinical measurements within normal limits will improve Outcome: Progressing Goal: Respiratory complications will improve Outcome: Progressing   Problem: Pain Managment: Goal: General experience of comfort will improve and/or be controlled Outcome: Progressing   Problem: Safety: Goal: Ability to remain free from injury will improve Outcome: Progressing

## 2023-11-17 NOTE — Significant Event (Signed)
       CROSS COVER NOTE  NAME: Brandi [name] MRN: [id number] DOB : [birth year] ATTENDING PHYSICIAN: Dezii, Alexandra, DO    Date of Service   11/17/2023   HPI/Events of Note   Patient with extensive medical history admitted with syncope, negative work up. Discharge cancelled yesterday secondary to constipation. Reported having chill like shaking earlier in shift. Meets SIRS criteria with tachycardia and increased resp rate. UA on admission indicative of UTI, no culture done  Latest Reference Range & Units 11/14/23 17:00  Appearance CLEAR  CLOUDY !  Bilirubin Urine NEGATIVE  NEGATIVE  Color, Urine YELLOW  YELLOW !  Glucose, UA NEGATIVE mg/dL NEGATIVE  Hgb urine dipstick NEGATIVE  NEGATIVE  Ketones, ur NEGATIVE mg/dL NEGATIVE  Leukocytes,Ua NEGATIVE  LARGE !  Nitrite NEGATIVE  POSITIVE !  pH 5.0 - 8.0  6.0  Protein NEGATIVE mg/dL NEGATIVE  Specific Gravity, Urine 1.005 - 1.030  1.006  Bacteria, UA NONE SEEN  RARE !  Hyaline Casts, UA  PRESENT  Mucus  PRESENT  RBC / HPF 0 - 5 RBC/hpf 0-5  !: Data is abnormal   Interventions   Assessment/Plan:    11/17/2023    3:45 AM 11/16/2023   11:19 PM 11/16/2023    8:00 PM  Vitals with BMI  Systolic 141 121 409  Diastolic 75 51 79  Pulse 121 124 116   Rectal temp 100.6  SIRS likely from UTI  Lactic acid 1.0  CBC significant leukocytosis (15.6 up from 6.5) with neutrophil dominanance Blood cultures Ua with reflex culture - in and out to obtain specimen white cells an bacteria, no nitrite present currently Start rocephin  - (pan sensitive e coli in urine 2024) - broaden scope if warranted but UTI most likely at time of initial assessment Chest xray - no acute abnormality  A fib not on anticoagulation + Progesterone  therapy  EKG - ST rate 120 with PVCs D dimer - wells score 6 but does not account for patient still on hormonal therapy for post menopausal bleeding (endometritis, negative cancer work up) since 12/2022  1.21 -  CTA chest  pending  Constipation - chronic Senna DS - 2 tabs this am; repeat enema     Kip Peon NP [facility] Cross Cover 7pm-7am - check amion for availability Pager [phone]

## 2023-11-17 NOTE — Progress Notes (Signed)
 PROGRESS NOTE    LAMYRA MALCOLM  NFA:[id number] DOB: 1935/10/10 DOA: 11/14/2023 PCP: Little Riff, MD  No chief complaint on file.   Hospital Course:  Brandi [name] is an 88 year old female with insulin -dependent diabetes, atrial fibrillation and a flutter, not on anticoagulation, hypertension, hyperlipidemia, anxiety, depression, CAD status post CABG times 08/2017 who presents to the ED with near syncopal episode with associated hypotension.  Patient has had multiple prior ED evaluations for similar events.  On arrival to the ED this admission vital signs are stable.  Lab work is largely unremarkable. On further evaluation appears the patient is on multiple antihypertensives.  She was found to be profoundly orthostatic with > 100 mmHg drop and subsequent tachycardia with position changes.  Her blood pressure medications were de-escalated, TED hose applied, and she had some improvement in her orthostasis.  No further syncopal episodes while admitted. She was also monitored on telemetry without any acute events.  Workup otherwise unremarkable including EEG, brain MRI, echocardiogram. We also discussed extensively her diabetes regimen.  Most recently patient's hemoglobin A1c is 6.1% and she was started on Ozempic which she has been gradually increasing.  She reports on review of her CGM she has consistent lows into the 60s.  We discussed that Ozempic is a poor choice for her given her comorbidities and prolonged washout period making it difficult to treat and follow her hypoglycemic episodes.  We also recommend holding her sulfonylurea which could also be contributing to hypoglycemic episodes.  We have decreased her daily glargine. She endorses understanding.  I recommend she follow-up with her endocrinologist to discuss alternative medication options.  Repeat hemoglobin A1c here reveals 7.6%, which may be a more appropriate goal for this patient given her age and comorbidities. On 5/30 patient reported  she was feeling much better and was requesting for discharge home. On presentation of discharge papers patient endorsed constipation and requested an enema.  She received enema and had small but not significant bowel movement.  KUB reveals moderate stool burden.  We discussed outpatient constipation regimen, the patient endorsed increasing nausea and preferred to stay in house to resolve problem.  She reports last bowel movement was 2 weeks prior to arrival.   Subjective: Overnight patient became increasingly tachycardic with increased respirations and endorsing some nausea.  UA was performed which revealed leukocytes and nitrates with rare bacteria.  She was started on ceftriaxone . CTA chest was ordered. This morning patient is endorsing some nausea but is otherwise well.  She has multiple family members at bedside.  Objective: Vitals:   11/17/23 0500 11/17/23 0514 11/17/23 0746 11/17/23 0749  BP:    133/70  Pulse:    (!) 114  Resp:   20 19  Temp:  (!) (P) 100.6 F (38.1 C)  98.4 F (36.9 C)  TempSrc:  (P) Rectal  Oral  SpO2:    94%  Weight: 82.9 kg     Height:        Intake/Output Summary (Last 24 hours) at 11/17/2023 0906 Last data filed at 11/17/2023 4696 Gross per 24 hour  Intake 240 ml  Output 100 ml  Net 140 ml   Filed Weights   11/14/23 1152 11/16/23 0517 11/17/23 0500  Weight: 85.7 kg 83.9 kg 82.9 kg   Examination: General exam: Appears calm and comfortable, NAD  Respiratory system: No work of breathing, symmetric chest wall expansion Cardiovascular system: S1 & S2 heard, RRR.  Gastrointestinal system: Abdomen is nondistended, soft and nontender.  Neuro:  Alert and oriented. No focal neurological deficits. Extremities: Symmetric, expected ROM Skin: No rashes, lesions Psychiatry: Demonstrates appropriate judgement and insight. Mood & affect appropriate for situation.   Assessment & Plan:  Principal Problem:   Syncope Active Problems:   Recurrent syncope   S/P  CABG x 3   Hyperlipidemia   Coronary arteriosclerosis   Depression, major, in remission (HCC)   Diabetic neuropathy (HCC)   OSA on CPAP   Diabetes mellitus type 2, insulin  dependent (HCC)   Anxiety   Presence of drug coated stent in LAD coronary artery   Chronic pain syndrome   Essential hypertension    SIRS - Criteria met with tachycardia, tachypnea, fever x 1 - Infectious workup initiated - UA with leukocytes and nitrates, has been started on ceftriaxone .  Will follow urine cultures - Blood cultures taken and pending - CTA without evidence of PE.  Notes hiatal hernia and known cardiac atherosclerosis - Suspect tachycardia is result of decreased Coreg  dosing.  Have increased back to prior dose  Constipation - Patient reveals that she has not had a bowel movement for 2 weeks prior to arrival Patient now status post enema, high-dose lactulose , patient is now having bowel movements. - KUB shows moderate stool burden - No evidence of obstruction. - Continue with lactulose  - Will need daily MiraLAX  for maintenance outpatient  Syncope - Likely secondary to profound orthostasis +/- hypoglycemia.  Orthostatic vital signs reveal lying blood pressure: 222/68, pulse 77.  Standing blood pressure 117/80, pulse 101 - Suspect polypharmacy is playing a role.   --Repeat orthostatics are significantly improved after de-escalation - Echocardiogram: LVEF 60 to 65%, no regional wall motion abnormalities, mild left ventricular hypertrophy, grade 1 diastolic dysfunction.  Mild mitral valve regurg, no aortic stenosis --EKG on arrival: Sinus rhythm, low voltage.  No obvious arrhythmia or ST changes - High-sensitivity troponin: 6 -No events on telemetry - Brain MRI: Extensive chronic small vessel ischemic disease, enlarged empty sella unchanged from prior exams. - EEG: WNL -PT/OT Home health ordered   Orthostatic hypotension - Lying blood pressure is extremely elevated, SBP consistently above 180.   When standing has >176mmg hg drop. -De-escalated antihypertensives at discharge - Repeat orthostatics are improved - Continue to wear TED hose   Polypharmacy -Patient is taking multiple antihistamines, and multiple antihypertensives.  Home medication list includes: Pepcid , meclizine , loratadine , Myrbetriq . Coreg , gabapentin  400 mg 3 times daily, losartan , verapamil , hydralazine  -Have deprescribed at discharge - Decrease dose of gabapentin , consider complete taper outpatient   Hypertension - As above.  De-escalated medications.  Have discussed with patient.   Chronic pain syndrome - PDMP reviewed - Currently on gabapentin  400 mg 3 times daily, given concerns of syncope we will resume this at lower dose   Anxiety - Home meds    Type 2 diabetes, insulin -dependent -Hemoglobin A1c 7.6%.  Patient currently taking sulfonylurea and Ozempic.  Is having hypoglycemic episodes at home, reports her CBG shows consistent lows in the 60s - Discontinue Ozempic and glimepiride.  Decrease home dose glargine.  Follow-up closely with endocrinology   CAD status post CABGx3, 2019 - Resume aspirin , statin.  Antihypertensive control as above   Atrial fibrillation, not on anticoagulation - Initially started patient on lower dose Coreg  given orthostasis as above.  Appears to becoming increasingly tachycardic.  Have resumed at higher dose of Coreg  today.  Will monitor for response   DVT prophylaxis: Heparin    Code Status: Full Code Disposition: Admitted for observation, attempted discharge yesterday which was held  for severe constipation.  Patient then began experiencing SIRS overnight.  Workup pending as above.  Consultants:    Procedures:    Antimicrobials:  Anti-infectives (From admission, onward)    Start     Dose/Rate Route Frequency Ordered Stop   11/17/23 0700  cefTRIAXone  (ROCEPHIN ) 1 g in sodium chloride  0.9 % 100 mL IVPB        1 g 200 mL/hr over 30 Minutes Intravenous Every 24 hours  11/17/23 0614         Data Reviewed: I have personally reviewed following labs and imaging studies CBC: Recent Labs  Lab 11/14/23 1148 11/15/23 0550 11/16/23 0454 11/17/23 0534  WBC 8.6 6.6 6.5 15.3*  NEUTROABS 5.3  --  3.0 12.9*  HGB 10.8* 11.3* 10.7* 11.9*  HCT 34.5* 34.8* 32.8* 35.7*  MCV 92.0 89.2 88.4 87.3  PLT 228 238 224 256   Basic Metabolic Panel: Recent Labs  Lab 11/14/23 1148 11/15/23 0550 11/16/23 0454 11/17/23 0534  NA 138 140 138 137  K 3.4* 3.6 3.2* 3.6  CL 107 107 106 105  CO2 25 28 25 22   GLUCOSE 123* 87 67* 158*  BUN 12 11 12 11   CREATININE 0.80 0.70 0.62 0.75  CALCIUM  7.5* 8.1* 7.9* 7.8*  MG  --   --  1.7 1.7  PHOS  --   --  3.2 4.3   GFR: Estimated Creatinine Clearance: 50.2 mL/min (by C-G formula based on SCr of 0.75 mg/dL). Liver Function Tests: Recent Labs  Lab 11/16/23 0454 11/17/23 0534  AST 18 19  ALT 21 24  ALKPHOS 77 86  BILITOT 0.6 1.0  PROT 4.0* 4.5*  ALBUMIN 1.9* 2.1*   CBG: Recent Labs  Lab 11/16/23 0754 11/16/23 1112 11/16/23 1727 11/16/23 2025 11/17/23 0748  GLUCAP 106* 158* 92 152* 169*    No results found for this or any previous visit (from the past 240 hours).   Radiology Studies: DG Chest Port 1 View Result Date: 11/17/2023 CLINICAL DATA:  88 year old female with tachycardia. EXAM: PORTABLE CHEST 1 VIEW COMPARISON:  Portable chest 11/14/2023 and earlier. FINDINGS: Portable AP semi upright view at 0517 hours. Chronic CABG. Left chest cardiac loop recorder or superficial ICD. Calcified aortic atherosclerosis. Stable cardiac size and mediastinal contours. Visualized tracheal air column is within normal limits. Larger lung volumes. Allowing for portable technique the lungs are clear. No pneumothorax or pleural effusion. No acute osseous abnormality identified. Paucity of visible bowel gas. IMPRESSION: No acute cardiopulmonary abnormality. Electronically Signed   By: Marlise Simpers M.D.   On: 11/17/2023 05:29   DG Abd 1  View Result Date: 11/16/2023 CLINICAL DATA:  Constipation.  Stool impaction. EXAM: ABDOMEN - 1 VIEW COMPARISON:  CT abdomen and pelvis 02/22/2023 FINDINGS: Gas and stool throughout the colon with moderately prominent colonic stool suggesting moderate stool burden. Gas-filled nondistended small bowel. No colonic distention. No radiopaque stones. Surgical clips in the right upper quadrant. Degenerative changes in the spine and hips. Vascular calcifications. Old right pubic ramus fractures. Visualized lung bases are clear. IMPRESSION: Nonobstructive bowel gas pattern with moderately prominent colonic stool. Electronically Signed   By: Boyce Byes M.D.   On: 11/16/2023 18:46    Scheduled Meds:  aspirin  EC  81 mg Oral Daily   carvedilol   25 mg Oral BID WC   ferrous sulfate   325 mg Oral Q breakfast   fluticasone  furoate-vilanterol  1 puff Inhalation Daily   heparin   5,000 Units Subcutaneous Q8H   insulin  aspart  0-15 Units Subcutaneous TID WC   insulin  aspart  0-5 Units Subcutaneous QHS   lactulose   30 g Oral TID   losartan   100 mg Oral QHS   melatonin  10 mg Oral QHS   polyvinyl alcohol   1-2 drop Both Eyes QHS   progesterone   100 mg Oral QHS   sodium chloride  flush  3 mL Intravenous Q12H   Continuous Infusions:  cefTRIAXone  (ROCEPHIN )  IV       LOS: 0 days  MDM: Patient is high risk for one or more organ failure.  They necessitate ongoing hospitalization for continued IV therapies and subsequent lab monitoring. Total time spent interpreting labs and vitals, reviewing the medical record, coordinating care amongst consultants and care team members, directly assessing and discussing care with the patient and/or family: 55 min  Felicite Zeimet, DO [facility]  To contact the attending physician between 7A-7P please use Epic Chat. To contact the covering physician during after hours 7P-7A, please review Amion.  11/17/2023, 9:06 AM   *This document has been created with the assistance  of dictation software. Please excuse typographical errors. *

## 2023-11-18 ENCOUNTER — Inpatient Hospital Stay

## 2023-11-18 DIAGNOSIS — I2510 Atherosclerotic heart disease of native coronary artery without angina pectoris: Secondary | ICD-10-CM | POA: Diagnosis not present

## 2023-11-18 DIAGNOSIS — R55 Syncope and collapse: Secondary | ICD-10-CM | POA: Diagnosis not present

## 2023-11-18 DIAGNOSIS — G894 Chronic pain syndrome: Secondary | ICD-10-CM | POA: Diagnosis not present

## 2023-11-18 DIAGNOSIS — I10 Essential (primary) hypertension: Secondary | ICD-10-CM | POA: Diagnosis not present

## 2023-11-18 LAB — CBC WITH DIFFERENTIAL/PLATELET
Abs Immature Granulocytes: 0.15 10*3/uL — ABNORMAL HIGH (ref 0.00–0.07)
Basophils Absolute: 0 10*3/uL (ref 0.0–0.1)
Basophils Relative: 0 %
Eosinophils Absolute: 0 10*3/uL (ref 0.0–0.5)
Eosinophils Relative: 0 %
HCT: 32.6 % — ABNORMAL LOW (ref 36.0–46.0)
Hemoglobin: 10.9 g/dL — ABNORMAL LOW (ref 12.0–15.0)
Immature Granulocytes: 1 %
Lymphocytes Relative: 6 %
Lymphs Abs: 1.4 10*3/uL (ref 0.7–4.0)
MCH: 29.3 pg (ref 26.0–34.0)
MCHC: 33.4 g/dL (ref 30.0–36.0)
MCV: 87.6 fL (ref 80.0–100.0)
Monocytes Absolute: 1.8 10*3/uL — ABNORMAL HIGH (ref 0.1–1.0)
Monocytes Relative: 8 %
Neutro Abs: 19.8 10*3/uL — ABNORMAL HIGH (ref 1.7–7.7)
Neutrophils Relative %: 85 %
Platelets: 252 10*3/uL (ref 150–400)
RBC: 3.72 MIL/uL — ABNORMAL LOW (ref 3.87–5.11)
RDW: 16.2 % — ABNORMAL HIGH (ref 11.5–15.5)
WBC: 23.3 10*3/uL — ABNORMAL HIGH (ref 4.0–10.5)
nRBC: 0 % (ref 0.0–0.2)

## 2023-11-18 LAB — COMPREHENSIVE METABOLIC PANEL WITH GFR
ALT: 22 U/L (ref 0–44)
AST: 17 U/L (ref 15–41)
Albumin: 2 g/dL — ABNORMAL LOW (ref 3.5–5.0)
Alkaline Phosphatase: 84 U/L (ref 38–126)
Anion gap: 8 (ref 5–15)
BUN: 15 mg/dL (ref 8–23)
CO2: 24 mmol/L (ref 22–32)
Calcium: 7.5 mg/dL — ABNORMAL LOW (ref 8.9–10.3)
Chloride: 105 mmol/L (ref 98–111)
Creatinine, Ser: 0.83 mg/dL (ref 0.44–1.00)
GFR, Estimated: >60 mL/min (ref >60)
Glucose, Bld: 183 mg/dL — ABNORMAL HIGH (ref 70–99)
Potassium: 3 mmol/L — ABNORMAL LOW (ref 3.5–5.1)
Sodium: 137 mmol/L (ref 135–145)
Total Bilirubin: 1 mg/dL (ref 0.0–1.2)
Total Protein: 4.3 g/dL — ABNORMAL LOW (ref 6.5–8.1)

## 2023-11-18 LAB — MAGNESIUM: Magnesium: 1.8 mg/dL (ref 1.7–2.4)

## 2023-11-18 LAB — GLUCOSE, CAPILLARY
Glucose-Capillary: 195 mg/dL — ABNORMAL HIGH (ref 70–99)
Glucose-Capillary: 165 mg/dL — ABNORMAL HIGH (ref 70–99)
Glucose-Capillary: 123 mg/dL — ABNORMAL HIGH (ref 70–99)
Glucose-Capillary: 129 mg/dL — ABNORMAL HIGH (ref 70–99)

## 2023-11-18 LAB — PHOSPHORUS: Phosphorus: 3.8 mg/dL (ref 2.5–4.6)

## 2023-11-18 MED ORDER — POTASSIUM CHLORIDE CRYS ER 20 MEQ PO TBCR
40.0000 meq | EXTENDED_RELEASE_TABLET | Freq: Once | ORAL | Status: AC
  Administered 2023-11-18: 40 meq via ORAL
  Filled 2023-11-18: qty 2

## 2023-11-18 MED ORDER — SODIUM CHLORIDE 0.9 % IV SOLN
1.0000 g | INTRAVENOUS | Status: DC
  Administered 2023-11-18 – 2023-11-19 (×2): 1 g via INTRAVENOUS
  Filled 2023-11-18 (×2): qty 10

## 2023-11-18 NOTE — Plan of Care (Signed)

## 2023-11-18 NOTE — Progress Notes (Signed)
 Physical Therapy Treatment Patient Details Name: Brandi [name] MRN: [id number] DOB: 10-02-1935 Today's Date: 11/18/2023   History of Present Illness Patient is a 88 year old female with near syncope and hypotension. History of insulin -dependent diabetes, atrial fibrillation and a flutter, not on anticoagulation, hypertension, hyperlipidemia, anxiety, depression, CAD status post CABG.    PT Comments  Pt ready for session.  To EOB with bed features and min a x 1. Steady in sitting.  Stands with cga x 1 and needs to use BR due to loose BM's.  Care provided with protective cream applied.  She is able to walk 12' x 1 before feeling she needs to go back to [facility].  No further BM noted.  She is able to progress gait 40' x 1 and 12' x 1 with RW and cga x 1.  Pt with some minor dizziness but did not impact session.  Gait belt given to pt/family for DC home to increase safety.  Pt does well and feels comfortable with DC home.   If plan is discharge home, recommend the following: A little help with walking and/or transfers;A little help with bathing/dressing/bathroom;Help with stairs or ramp for entrance;Assistance with cooking/housework   Can travel by private vehicle        Equipment Recommendations  None recommended by PT    Recommendations for Other Services       Precautions / Restrictions Precautions Precautions: Fall Recall of Precautions/Restrictions: Intact Restrictions Weight Bearing Restrictions Per Provider Order: No     Mobility  Bed Mobility Overal bed mobility: Needs Assistance Bed Mobility: Supine to Sit     Supine to sit: HOB elevated, Used rails, Min assist Sit to supine: Mod assist     Patient Response: Cooperative  Transfers Overall transfer level: Needs assistance Equipment used: Rolling walker (2 wheels) Transfers: Sit to/from Stand Sit to Stand: Contact guard assist                Ambulation/Gait Ambulation/Gait assistance: Contact guard assist, Min  assist Gait Distance (Feet): 40 Feet Assistive device: Rolling walker (2 wheels) Gait Pattern/deviations: Step-through pattern, Decreased step length - right, Decreased step length - left, Trunk flexed Gait velocity: dec     General Gait Details: minimal dizziness that did not limit gait   Stairs             Wheelchair Mobility     Tilt Bed Tilt Bed Patient Response: Cooperative  Modified Rankin (Stroke Patients Only)       Balance Overall balance assessment: Needs assistance Sitting-balance support: Feet supported Sitting balance-Leahy Scale: Fair     Standing balance support: Bilateral upper extremity supported Standing balance-Leahy Scale: Fair Standing balance comment: +1 for safety                            Communication Communication Communication: No apparent difficulties  Cognition       PT - Cognitive impairments: No apparent impairments                         Following commands: Intact Following commands impaired: Only follows one step commands consistently    Cueing Cueing Techniques: Verbal cues  Exercises Other Exercises Other Exercises: to BSC x 2 during session for BM    General Comments        Pertinent Vitals/Pain Pain Assessment Pain Assessment: Faces Faces Pain Scale: Hurts even more Pain Location: bottom  from loose BM's Pain Descriptors / Indicators: Sore Pain Intervention(s): Monitored during session    Home Living                          Prior Function            PT Goals (current goals can now be found in the care plan section) Progress towards PT goals: Progressing toward goals    Frequency    Min 2X/week      PT Plan      Co-evaluation              AM-PAC PT "6 Clicks" Mobility   Outcome Measure  Help needed turning from your back to your side while in a flat bed without using bedrails?: A Little Help needed moving from lying on your back to sitting on the  side of a flat bed without using bedrails?: A Little Help needed moving to and from a bed to a chair (including a wheelchair)?: A Little Help needed standing up from a chair using your arms (e.g., wheelchair or bedside chair)?: A Little Help needed to walk in hospital room?: A Little Help needed climbing 3-5 steps with a railing? : A Lot 6 Click Score: 17    End of Session Equipment Utilized During Treatment: Gait belt Activity Tolerance: Patient tolerated treatment well Patient left: in bed;with bed alarm set;with call bell/phone within reach;with family/visitor present Nurse Communication: Mobility status PT Visit Diagnosis: Muscle weakness (generalized) (M62.81);Unsteadiness on feet (R26.81)     Time: 1424-1450 PT Time Calculation (min) (ACUTE ONLY): 26 min  Charges:    $Gait Training: 8-22 mins $Therapeutic Activity: 8-22 mins PT General Charges $$ ACUTE PT VISIT: 1 Visit                   Charlanne Cong, PTA 11/18/23, 2:57 PM

## 2023-11-18 NOTE — Progress Notes (Signed)
 PROGRESS NOTE    Brandi [name]  WUJ:[id number] DOB: 1935/10/13 DOA: 11/14/2023 PCP: Little Riff, MD  No chief complaint on file.   Hospital Course:  Brandi [name] is an 88 year old female with insulin -dependent diabetes, atrial fibrillation and a flutter, not on anticoagulation, hypertension, hyperlipidemia, anxiety, depression, CAD status post CABG times 08/2017 who presents to the ED with near syncopal episode with associated hypotension.  Patient has had multiple prior ED evaluations for similar events.  On arrival to the ED this admission vital signs are stable.  Lab work is largely unremarkable. On further evaluation appears the patient is on multiple antihypertensives.  She was found to be profoundly orthostatic with > 100 mmHg drop and subsequent tachycardia with position changes.  Her blood pressure medications were de-escalated, TED hose applied, and she had some improvement in her orthostasis.  No further syncopal episodes while admitted. She was also monitored on telemetry without any acute events.  Workup otherwise unremarkable including EEG, brain MRI, echocardiogram. We also discussed extensively her diabetes regimen.  Most recently patient's hemoglobin A1c is 6.1% and she was started on Ozempic which she has been gradually increasing.  She reports on review of her CGM she has consistent lows into the 60s.  We discussed that Ozempic is a poor choice for her given her comorbidities and prolonged washout period making it difficult to treat and follow her hypoglycemic episodes.  We also recommend holding her sulfonylurea which could also be contributing to hypoglycemic episodes.  We have decreased her daily glargine. She endorses understanding.  I recommend she follow-up with her endocrinologist to discuss alternative medication options.  Repeat hemoglobin A1c here reveals 7.6%, which may be a more appropriate goal for this patient given her age and comorbidities. On 5/30 patient reported  she was feeling much better and was requesting for discharge home. On presentation of discharge papers patient endorsed constipation and requested an enema.  She received enema and had small but not significant bowel movement.  KUB reveals moderate stool burden.  We discussed outpatient constipation regimen, the patient endorsed increasing nausea and preferred to stay in house to resolve problem.  She reports last bowel movement was 2 weeks prior to arrival. Overnight patient became increasingly tachycardic with increased respirations and had a fever.  Sepsis workup was initiated.  CTA chest was performed.  Workup has been grossly unremarkable but WBC continues to rise.  Patient denies any new signs or symptoms and has had no further fever   Subjective: This morning patient is groaning as she is having a bowel movement.  She reports that she has had many bowel movements since initiating lactulose  and she would like to back off on that medication now.  She does continue to endorse some nausea but denies any other pain or symptoms.  No fever overnight.  Objective: Vitals:   11/18/23 0845 11/18/23 0921 11/18/23 1215 11/18/23 1544  BP: 118/64 (!) 155/80 135/63 (!) 116/59  Pulse: (!) 113 (!) 107 99 97  Resp: 18 19 20    Temp: 98.9 F (37.2 C) 98.9 F (37.2 C) 98 F (36.7 C) 98.4 F (36.9 C)  TempSrc: Oral  Oral Oral  SpO2: 97% 98% 96% 94%  Weight:      Height:        Intake/Output Summary (Last 24 hours) at 11/18/2023 1649 Last data filed at 11/18/2023 1357 Gross per 24 hour  Intake 220 ml  Output --  Net 220 ml   [employer]  11/16/23 0517 11/17/23 0500 11/18/23 0520  Weight: 83.9 kg 82.9 kg 85 kg   Examination: General exam: Appears calm and comfortable, NAD  Respiratory system: No work of breathing, symmetric chest wall expansion Cardiovascular system: S1 & S2 heard, RRR.  Gastrointestinal system: Abdomen is nondistended, soft and nontender.  Neuro: Alert and oriented. No focal  neurological deficits. Extremities: Symmetric, expected ROM Skin: No rashes, lesions Psychiatry: Demonstrates appropriate judgement and insight. Mood & affect appropriate for situation.   Assessment & Plan:  Principal Problem:   Syncope Active Problems:   Recurrent syncope   S/P CABG x 3   Hyperlipidemia   Coronary arteriosclerosis   Depression, major, in remission (HCC)   Diabetic neuropathy (HCC)   OSA on CPAP   Diabetes mellitus type 2, insulin  dependent (HCC)   Anxiety   Presence of drug coated stent in LAD coronary artery   Chronic pain syndrome   Essential hypertension   SIRS (systemic inflammatory response syndrome) (HCC)    SIRS Leukocytosis - SIRS criteria met with tachycardia, tachypnea, fever x 1 - Infectious workup initiated, thus far unimpressive - Blood cultures remain negative - Urine cultures positive for E. coli.  Patient is on ceftriaxone  which we will continue - CTA without evidence of PE, notes hiatal hernia and known cardiac atherosclerosis - Despite adequate treatment for UTI WBC continues to rise.  Will follow urine cultures closely in case there is some resistance - Dopplers performed today are negative for acute DVT - No further fever - Suspect tachycardia is at least some part related to decreased Coreg  dosing.  It is improving now that she is on her original dose. - Continue to trend CBC and fever curve closely - Monitor for new symptoms  Constipation - Patient reveals that she has not had a bowel movement for 2 weeks prior to arrival - KUB with moderate stool burden - Initiated on enema lactulose  5/31.  Patient has now had multiple bowel movements and would like to discontinue further lactulose  - Will need daily MiraLAX  for maintenance outpatient  Syncope -Multifactorial.  Likely secondary to profound orthostasis +/- hypoglycemia.  Orthostatic vital signs reveal lying blood pressure: 222/68, pulse 77.  Standing blood pressure 117/80, pulse  101 - Suspect polypharmacy is playing a role.   --Repeat orthostatics are significantly improved after de-escalation - Echocardiogram: LVEF 60 to 65%, no regional wall motion abnormalities, mild left ventricular hypertrophy, grade 1 diastolic dysfunction.  Mild mitral valve regurg, no aortic stenosis --EKG on arrival: Sinus rhythm, low voltage.  No obvious arrhythmia or ST changes - High-sensitivity troponin: 6 -No events on telemetry - Brain MRI: Extensive chronic small vessel ischemic disease, enlarged empty sella unchanged from prior exams. - EEG: WNL -PT/OT Home health ordered   Hypokalemia - Continue to replace as needed - Trend daily CMP  Orthostatic hypotension - Lying blood pressure is extremely elevated, SBP consistently above 180.  When standing has >176mmg hg drop. -De-escalated antihypertensives at discharge - Repeat orthostatics are improved - Continue to wear TED hose   Polypharmacy -Patient is taking multiple antihistamines, and multiple antihypertensives.  Home medication list includes: Pepcid , meclizine , loratadine , Myrbetriq . Coreg , gabapentin  400 mg 3 times daily, losartan , verapamil , hydralazine  -Have deprescribed these medications.  Will continue to titrate while admitted. - Decrease dose of gabapentin , consider complete taper outpatient   Hypertension - As above.  Medications have been de-escalated - Continue close monitoring and daily titration.  Currently blood pressure is at goal   Chronic pain syndrome -  PDMP reviewed - Currently on gabapentin  400 mg 3 times daily, given concerns of syncope we will resume this at lower dose   Anxiety - Home meds    Type 2 diabetes, insulin -dependent -Hemoglobin A1c 7.6%.  Patient currently taking sulfonylurea and Ozempic.  Is having hypoglycemic episodes at home, reports her CBG shows consistent lows in the 60s - Discontinue Ozempic and glimepiride.  Decrease home dose glargine.  Follow-up closely with  endocrinology -continue following daily CBGs while admitted.  Patient remains under excellent control with minimal amounts of short acting insulin .   CAD status post CABGx3, 2019 - Resume aspirin , statin.  Antihypertensive control as above   Atrial fibrillation, not on anticoagulation - Initially started patient on lower dose Coreg  given orthostasis as above.  Appears to becoming increasingly tachycardic.  Have resumed at higher dose of Coreg  today.  Will monitor for response   DVT prophylaxis: Heparin    Code Status: Full Code Disposition: Inpatient now for ongoing infectious workup.  Pending urine culture results.  Currently on IV antibiotics with rising leukocytosis  Consultants:    Procedures:    Antimicrobials:  Anti-infectives (From admission, onward)    Start     Dose/Rate Route Frequency Ordered Stop   11/18/23 0800  cefTRIAXone  (ROCEPHIN ) 1 g in sodium chloride  0.9 % 100 mL IVPB        1 g 200 mL/hr over 30 Minutes Intravenous Every 24 hours 11/18/23 0227     11/17/23 0700  cefTRIAXone  (ROCEPHIN ) 1 g in sodium chloride  0.9 % 100 mL IVPB  Status:  Discontinued        1 g 200 mL/hr over 30 Minutes Intravenous Every 24 hours 11/17/23 0614 11/18/23 0227       Data Reviewed: I have personally reviewed following labs and imaging studies CBC: Recent Labs  Lab 11/14/23 1148 11/15/23 0550 11/16/23 0454 11/17/23 0534 11/18/23 0616  WBC 8.6 6.6 6.5 15.3* 23.3*  NEUTROABS 5.3  --  3.0 12.9* 19.8*  HGB 10.8* 11.3* 10.7* 11.9* 10.9*  HCT 34.5* 34.8* 32.8* 35.7* 32.6*  MCV 92.0 89.2 88.4 87.3 87.6  PLT 228 238 224 256 252   Basic Metabolic Panel: Recent Labs  Lab 11/14/23 1148 11/15/23 0550 11/16/23 0454 11/17/23 0534 11/18/23 0616  NA 138 140 138 137 137  K 3.4* 3.6 3.2* 3.6 3.0*  CL 107 107 106 105 105  CO2 25 28 25 22 24   GLUCOSE 123* 87 67* 158* 183*  BUN 12 11 12 11 15   CREATININE 0.80 0.70 0.62 0.75 0.83  CALCIUM  7.5* 8.1* 7.9* 7.8* 7.5*  MG  --   --   1.7 1.7 1.8  PHOS  --   --  3.2 4.3 3.8   GFR: Estimated Creatinine Clearance: 49 mL/min (by C-G formula based on SCr of 0.83 mg/dL). Liver Function Tests: Recent Labs  Lab 11/16/23 0454 11/17/23 0534 11/18/23 0616  AST 18 19 17   ALT 21 24 22   ALKPHOS 77 86 84  BILITOT 0.6 1.0 1.0  PROT 4.0* 4.5* 4.3*  ALBUMIN 1.9* 2.1* 2.0*   CBG: Recent Labs  Lab 11/17/23 1623 11/17/23 2022 11/18/23 0819 11/18/23 1226 11/18/23 1622  GLUCAP 73 189* 195* 165* 123*    Recent Results (from the past 240 hours)  Urine Culture (for pregnant, neutropenic or urologic patients or patients with an indwelling urinary catheter)     Status: Abnormal (Preliminary result)   Collection Time: 11/17/23  6:10 AM   Specimen: Urine, Random  Result Value  Ref Range Status   Specimen Description   Final    URINE, RANDOM Performed at [facility], [address], [place], [place] [address]    Special Requests   Final    NONE Performed at [facility], [address] [place], [place] [address]    Culture (A)  Final    >=100,000 COLONIES/mL ESCHERICHIA COLI SUSCEPTIBILITIES TO FOLLOW Performed at [facility], [address] [place], [place] [address]    Report Status PENDING  Incomplete  Culture, blood (Routine X 2) w Reflex to ID Panel     Status: None (Preliminary result)   Collection Time: 11/17/23  9:18 AM   Specimen: BLOOD  Result Value Ref Range Status   Specimen Description BLOOD LEFT ANTECUBITAL  Final   Special Requests   Final    BOTTLES DRAWN AEROBIC ONLY Blood Culture results may not be optimal due to an inadequate volume of blood received in culture bottles   Culture   Final    NO GROWTH < 24 HOURS Performed at [facility], [address] [place], [place] [address]    Report Status PENDING  Incomplete  Culture, blood (Routine X 2) w Reflex to ID Panel     Status: None (Preliminary result)   Collection Time: 11/17/23  9:22 AM   Specimen: BLOOD   Result Value Ref Range Status   Specimen Description BLOOD RIGHT ANTECUBITAL  Final   Special Requests   Final    BOTTLES DRAWN AEROBIC AND ANAEROBIC Blood Culture adequate volume   Culture   Final    NO GROWTH < 24 HOURS Performed at [facility], [address] [place], [place] [address]    Report Status PENDING  Incomplete     Radiology Studies: US  Venous Img Lower Bilateral (DVT) Result Date: 11/18/2023 CLINICAL DATA:  Bilateral lower extremity swelling. EXAM: BILATERAL LOWER EXTREMITY VENOUS DOPPLER ULTRASOUND TECHNIQUE: Gray-scale sonography with graded compression, as well as color Doppler and duplex ultrasound were performed to evaluate the lower extremity deep venous systems from the level of the common femoral vein and including the common femoral, femoral, profunda femoral, popliteal and calf veins including the posterior tibial, peroneal and gastrocnemius veins when visible. The superficial great saphenous vein was also interrogated. Spectral Doppler was utilized to evaluate flow at rest and with distal augmentation maneuvers in the common femoral, femoral and popliteal veins. COMPARISON:  Right lower extremity DVT study 10/18/2022 FINDINGS: RIGHT LOWER EXTREMITY Common Femoral Vein: No evidence of thrombus. Normal compressibility, respiratory phasicity and response to augmentation. Saphenofemoral Junction: No evidence of thrombus. Normal compressibility and flow on color Doppler imaging. Profunda Femoral Vein: No evidence of thrombus. Normal compressibility and flow on color Doppler imaging. Femoral Vein: No evidence of thrombus. Normal compressibility, respiratory phasicity and response to augmentation. Popliteal Vein: No evidence of thrombus. Normal compressibility, respiratory phasicity and response to augmentation. Calf Veins: No evidence of thrombus. Other Findings:  None. LEFT LOWER EXTREMITY Common Femoral Vein: No evidence of thrombus. Normal compressibility, respiratory  phasicity and response to augmentation. Saphenofemoral Junction: No evidence of thrombus. Normal compressibility and flow on color Doppler imaging. Profunda Femoral Vein: No evidence of thrombus. Normal compressibility and flow on color Doppler imaging. Femoral Vein: No evidence of thrombus. Normal compressibility, respiratory phasicity and response to augmentation. Popliteal Vein: No evidence of thrombus.  Normal compressibility. Calf Veins: No evidence of thrombus. Normal compressibility and flow on color Doppler imaging. Other Findings:  None. IMPRESSION: No evidence  of deep venous thrombosis in either lower extremity. Electronically Signed   By: Donnal Fusi M.D.   On: 11/18/2023 11:47   CT Angio Chest Pulmonary Embolism (PE) W or WO Contrast Result Date: 11/17/2023 CLINICAL DATA:  PE EXAM: CT ANGIOGRAPHY CHEST WITH CONTRAST TECHNIQUE: Multidetector CT imaging of the chest was performed using the standard protocol during bolus administration of intravenous contrast. Multiplanar CT image reconstructions and MIPs were obtained to evaluate the vascular anatomy. RADIATION DOSE REDUCTION: This exam was performed according to the departmental dose-optimization program which includes automated exposure control, adjustment of the mA and/or kV according to patient size and/or use of iterative reconstruction technique. CONTRAST:  75mL OMNIPAQUE  IOHEXOL  350 MG/ML SOLN COMPARISON:  None Available. FINDINGS: Cardiovascular: No filling defects in pulmonary is to suggest PE. No cardiomegaly or pericardial effusion. There is marked atheromatous calcification of the aorta and its branches. No aneurysm. Status post median sternotomy and CABG. Mediastinum/Nodes: No abnormality of the thyroid. No suspicious adenopathy. Lungs/Pleura: Linear basilar subsegmental atelectasis or scarring on the right. No pneumothorax or pleural effusions. Upper Abdomen: Moderate hiatal hernia. Musculoskeletal: There are thoracic degenerative  changes. Review of the MIP images confirms the above findings. IMPRESSION: 1. No evidence of PE. 2. Aortic atherosclerosis (ICD10-I70.0). 3. Hiatal hernia. Electronically Signed   By: Sydell Eva M.D.   On: 11/17/2023 09:19   DG Chest Port 1 View Result Date: 11/17/2023 CLINICAL DATA:  88 year old female with tachycardia. EXAM: PORTABLE CHEST 1 VIEW COMPARISON:  Portable chest 11/14/2023 and earlier. FINDINGS: Portable AP semi upright view at 0517 hours. Chronic CABG. Left chest cardiac loop recorder or superficial ICD. Calcified aortic atherosclerosis. Stable cardiac size and mediastinal contours. Visualized tracheal air column is within normal limits. Larger lung volumes. Allowing for portable technique the lungs are clear. No pneumothorax or pleural effusion. No acute osseous abnormality identified. Paucity of visible bowel gas. IMPRESSION: No acute cardiopulmonary abnormality. Electronically Signed   By: Marlise Simpers M.D.   On: 11/17/2023 05:29   DG Abd 1 View Result Date: 11/16/2023 CLINICAL DATA:  Constipation.  Stool impaction. EXAM: ABDOMEN - 1 VIEW COMPARISON:  CT abdomen and pelvis 02/22/2023 FINDINGS: Gas and stool throughout the colon with moderately prominent colonic stool suggesting moderate stool burden. Gas-filled nondistended small bowel. No colonic distention. No radiopaque stones. Surgical clips in the right upper quadrant. Degenerative changes in the spine and hips. Vascular calcifications. Old right pubic ramus fractures. Visualized lung bases are clear. IMPRESSION: Nonobstructive bowel gas pattern with moderately prominent colonic stool. Electronically Signed   By: Boyce Byes M.D.   On: 11/16/2023 18:46    Scheduled Meds:  aspirin  EC  81 mg Oral Daily   carvedilol   25 mg Oral BID WC   ferrous sulfate   325 mg Oral Q breakfast   fluticasone  furoate-vilanterol  1 puff Inhalation Daily   heparin   5,000 Units Subcutaneous Q8H   insulin  aspart  0-15 Units Subcutaneous TID WC    insulin  aspart  0-5 Units Subcutaneous QHS   losartan   100 mg Oral QHS   melatonin  10 mg Oral QHS   polyvinyl alcohol   1-2 drop Both Eyes QHS   progesterone   100 mg Oral QHS   sodium chloride  flush  3 mL Intravenous Q12H   Continuous Infusions:  cefTRIAXone  (ROCEPHIN )  IV 1 g (11/18/23 0941)     LOS: 1 day  MDM: Patient is high risk for one or more organ failure.  They necessitate ongoing hospitalization for continued IV therapies  and subsequent lab monitoring. Total time spent interpreting labs and vitals, reviewing the medical record, coordinating care amongst consultants and care team members, directly assessing and discussing care with the patient and/or family: 55 min  Alfredo Collymore, DO [facility]  To contact the attending physician between 7A-7P please use Epic Chat. To contact the covering physician during after hours 7P-7A, please review Amion.  11/18/2023, 4:49 PM   *This document has been created with the assistance of dictation software. Please excuse typographical errors. *

## 2023-11-19 ENCOUNTER — Other Ambulatory Visit: Payer: Self-pay

## 2023-11-19 DIAGNOSIS — R6510 Systemic inflammatory response syndrome (SIRS) of non-infectious origin without acute organ dysfunction: Secondary | ICD-10-CM

## 2023-11-19 LAB — URINE CULTURE: Culture: >=100000 — AB

## 2023-11-19 LAB — COMPREHENSIVE METABOLIC PANEL WITH GFR
ALT: 22 U/L (ref 0–44)
AST: 16 U/L (ref 15–41)
Albumin: 1.9 g/dL — ABNORMAL LOW (ref 3.5–5.0)
Alkaline Phosphatase: 86 U/L (ref 38–126)
Anion gap: 7 (ref 5–15)
BUN: 16 mg/dL (ref 8–23)
CO2: 25 mmol/L (ref 22–32)
Calcium: 7.6 mg/dL — ABNORMAL LOW (ref 8.9–10.3)
Chloride: 103 mmol/L (ref 98–111)
Creatinine, Ser: 0.84 mg/dL (ref 0.44–1.00)
GFR, Estimated: >60 mL/min (ref >60)
Glucose, Bld: 130 mg/dL — ABNORMAL HIGH (ref 70–99)
Potassium: 4.1 mmol/L (ref 3.5–5.1)
Sodium: 135 mmol/L (ref 135–145)
Total Bilirubin: 0.8 mg/dL (ref 0.0–1.2)
Total Protein: 4.2 g/dL — ABNORMAL LOW (ref 6.5–8.1)

## 2023-11-19 LAB — CBC WITH DIFFERENTIAL/PLATELET
Abs Immature Granulocytes: 0.08 10*3/uL — ABNORMAL HIGH (ref 0.00–0.07)
Basophils Absolute: 0 10*3/uL (ref 0.0–0.1)
Basophils Relative: 0 %
Eosinophils Absolute: 0.1 10*3/uL (ref 0.0–0.5)
Eosinophils Relative: 1 %
HCT: 31.9 % — ABNORMAL LOW (ref 36.0–46.0)
Hemoglobin: 10.6 g/dL — ABNORMAL LOW (ref 12.0–15.0)
Immature Granulocytes: 1 %
Lymphocytes Relative: 12 %
Lymphs Abs: 1.8 10*3/uL (ref 0.7–4.0)
MCH: 29 pg (ref 26.0–34.0)
MCHC: 33.2 g/dL (ref 30.0–36.0)
MCV: 87.2 fL (ref 80.0–100.0)
Monocytes Absolute: 1.4 10*3/uL — ABNORMAL HIGH (ref 0.1–1.0)
Monocytes Relative: 10 %
Neutro Abs: 11.6 10*3/uL — ABNORMAL HIGH (ref 1.7–7.7)
Neutrophils Relative %: 76 %
Platelets: 237 10*3/uL (ref 150–400)
RBC: 3.66 MIL/uL — ABNORMAL LOW (ref 3.87–5.11)
RDW: 16.2 % — ABNORMAL HIGH (ref 11.5–15.5)
WBC: 15.1 10*3/uL — ABNORMAL HIGH (ref 4.0–10.5)
nRBC: 0 % (ref 0.0–0.2)

## 2023-11-19 LAB — GLUCOSE, CAPILLARY
Glucose-Capillary: 162 mg/dL — ABNORMAL HIGH (ref 70–99)
Glucose-Capillary: 244 mg/dL — ABNORMAL HIGH (ref 70–99)

## 2023-11-19 LAB — MAGNESIUM: Magnesium: 1.9 mg/dL (ref 1.7–2.4)

## 2023-11-19 LAB — PHOSPHORUS: Phosphorus: 1.7 mg/dL — ABNORMAL LOW (ref 2.5–4.6)

## 2023-11-19 MED ORDER — CARVEDILOL 25 MG PO TABS
25.0000 mg | ORAL_TABLET | Freq: Two times a day (BID) | ORAL | 0 refills | Status: DC
  Filled 2023-11-19: qty 60, 30d supply, fill #0

## 2023-11-19 MED ORDER — K PHOS MONO-SOD PHOS DI & MONO 155-852-130 MG PO TABS
500.0000 mg | ORAL_TABLET | ORAL | Status: AC
  Administered 2023-11-19: 500 mg via ORAL
  Filled 2023-11-19: qty 2

## 2023-11-19 MED ORDER — CEPHALEXIN 500 MG PO CAPS
500.0000 mg | ORAL_CAPSULE | Freq: Two times a day (BID) | ORAL | 0 refills | Status: AC
  Filled 2023-11-19: qty 8, 4d supply, fill #0

## 2023-11-19 MED ORDER — K PHOS MONO-SOD PHOS DI & MONO 155-852-130 MG PO TABS
500.0000 mg | ORAL_TABLET | Freq: Once | ORAL | 0 refills | Status: AC
  Filled 2023-11-19: qty 2, 1d supply, fill #0

## 2023-11-19 NOTE — Progress Notes (Signed)
 Occupational Therapy Treatment Patient Details Name: Brandi [name] MRN: [id number] DOB: 02/08/1936 Today's Date: 11/19/2023   History of present illness Patient is a 88 year old female with near syncope and hypotension. History of insulin -dependent diabetes, atrial fibrillation and a flutter, not on anticoagulation, hypertension, hyperlipidemia, anxiety, depression, CAD status post CABG.   OT comments  Pt is supine in bed on arrival with son and nurse tech present. Pleasant and agreeable to OT session. Reports she is having a BM in her pull-up, willing to get up to [facility] to finish. She is having severe pain to her buttocks from soreness and skin breakdown issues. Pt performed bed mobility with Min to Mod A, verb cues and use of bed rail. Pt required CGA for STS from EOB to RW and SPT bed<>BSC with cueing for safety/hand placement. Multiple STS from [facility] for peri-care and LB dressing. Min to Mod A for LB ADLs d/t weakness. Anterior lean over RW with constant CGA for safety. Son present and very active in her care-will assist as needed and reports providing varying levels of assist for ADLs. Edu on repositioning, walking and toileting schedules to prevent further skin breakdown to her buttocks. Pt returned to bed with all needs in place and will cont to require skilled acute OT services to maximize her safety and IND to return to PLOF.       If plan is discharge home, recommend the following:  A lot of help with bathing/dressing/bathroom;Assistance with cooking/housework;Assist for transportation;A little help with walking and/or transfers;Help with stairs or ramp for entrance   Equipment Recommendations  BSC/3in1    Recommendations for Other Services      Precautions / Restrictions Precautions Precautions: Fall Recall of Precautions/Restrictions: Intact Restrictions Weight Bearing Restrictions Per Provider Order: No       Mobility Bed Mobility Overal bed mobility: Needs Assistance Bed  Mobility: Supine to Sit, Sit to Supine     Supine to sit: HOB elevated, Used rails, Min assist Sit to supine: Mod assist   General bed mobility comments: Min A to bring trunk forward via HHA and Mod A for BLE management to return to supine    Transfers Overall transfer level: Needs assistance Equipment used: Rolling walker (2 wheels) Transfers: Sit to/from Stand, Bed to chair/wheelchair/BSC Sit to Stand: Contact guard assist     Step pivot transfers: Contact guard assist     General transfer comment: cueing for hand placement on bed, bed rails, BSC, etc; no LOB in standing, however significant anterior lean on walker for peri-care     Balance Overall balance assessment: Needs assistance Sitting-balance support: Feet supported Sitting balance-Leahy Scale: Fair     Standing balance support: During functional activity, Single extremity supported Standing balance-Leahy Scale: Fair Standing balance comment: CGA x1 for safety in standing                           ADL either performed or assessed with clinical judgement   ADL Overall ADL's : Needs assistance/impaired                     Lower Body Dressing: Sitting/lateral leans;Sit to/from stand;Moderate assistance Lower Body Dressing Details (indicate cue type and reason): to doff/don pull up Toilet Transfer: Contact guard assist;Rolling walker (2 wheels);Stand-pivot;BSC/3in1   Toileting- Architect and Hygiene: Minimal assistance;Sit to/from stand Toileting - Clothing Manipulation Details (indicate cue type and reason): pt able to perform most of her  peri-care, min A provided for thoroughness and to apply cream to area of breakdown            Extremity/Trunk Assessment              Vision       Perception     Praxis     Communication Communication Communication: No apparent difficulties   Cognition Arousal: Alert Behavior During Therapy: WFL for tasks assessed/performed                                  Following commands: Intact Following commands impaired: Only follows one step commands consistently      Cueing   Cueing Techniques: Verbal cues  Exercises Other Exercises Other Exercises: Edu on repositioning, movement and toileting schedules every few hours to prevent further skin breakdown on her buttocks.    Shoulder Instructions       General Comments fatigues easily, unable to urinate on [facility] although she felt like she had to, small loose BM with very sore buttocks and breakdown noted    Pertinent Vitals/ Pain       Pain Assessment Pain Assessment: Faces Faces Pain Scale: Hurts whole lot Pain Location: buttocks from loose stools Pain Descriptors / Indicators: Sore, Tender Pain Intervention(s): Monitored during session, Repositioned  Home Living                                          Prior Functioning/Environment              Frequency           Progress Toward Goals  OT Goals(current goals can now be found in the care plan section)  Progress towards OT goals: Progressing toward goals  Acute Rehab OT Goals Patient Stated Goal: return home OT Goal Formulation: With patient/family Time For Goal Achievement: 11/30/23 Potential to Achieve Goals: Good  Plan      Co-evaluation                 AM-PAC OT "6 Clicks" Daily Activity     Outcome Measure   Help from another person eating meals?: None Help from another person taking care of personal grooming?: A Little Help from another person toileting, which includes using toliet, bedpan, or urinal?: A Lot Help from another person bathing (including washing, rinsing, drying)?: A Lot Help from another person to put on and taking off regular upper body clothing?: A Little Help from another person to put on and taking off regular lower body clothing?: A Lot 6 Click Score: 16    End of Session Equipment Utilized During Treatment: Gait  belt;Rolling walker (2 wheels)  OT Visit Diagnosis: Unsteadiness on feet (R26.81);Muscle weakness (generalized) (M62.81);Dizziness and giddiness (R42)   Activity Tolerance Patient tolerated treatment well   Patient Left in bed;with call bell/phone within reach;with bed alarm set;with family/visitor present   Nurse Communication Mobility status        Time: 8295-6213 OT Time Calculation (min): 30 min  Charges: OT General Charges $OT Visit: 1 Visit OT Treatments $Self Care/Home Management : 23-37 mins  Marlis Oldaker, OTR/L  11/19/23, 1:07 PM   Joliyah Lippens E Halo Laski 11/19/2023, 1:03 PM

## 2023-11-19 NOTE — TOC Initial Note (Signed)
 Transition of Care [facility]) - Initial/Assessment Note    Patient Details  Name: Brandi [name] MRN: [id number] Date of Birth: 05-19-1936  Transition of [facility]) CM/SW Contact:    Odilia Bennett, LCSW Phone Number: 11/19/2023, 12:36 PM  Clinical Narrative:   Readmission prevention screen complete. CSW met with patient. Son at bedside. CSW introduced role and explained that discharge planning would be discussed. PCP is Martine Sleek, MD. Son drives her to appointments. Pharmacy is [facility] on the corner of [address] and [address]. No issues obtaining medications. Patient lives home with her son. No home health prior to admission but she is agreeable. First preference is [facility] as she has worked with them in the past. They have accepted the referral for PT and OT. She has a shower seat, BSC, rollator, and wheelchair at home. No further concerns. CSW will continue to follow patient and her son for support and facilitate return home once stable. Son will transport at discharge.               Expected Discharge Plan: Home w Home Health Services Barriers to Discharge: Continued Medical Work up   Patient Goals and CMS Choice   CMS Medicare.gov Compare Post Acute Care list provided to:: Patient Choice offered to / list presented to : Patient      Expected Discharge Plan and Services     Post Acute Care Choice: Home Health Living arrangements for the past 2 months: Single Family Home Expected Discharge Date: 11/16/23                         HH Arranged: PT, OT [facility] Agency: [facility] Date [facility] Agency Contacted: 11/19/23   Representative spoke with at [facility] Agency: Verdis Glade  Prior Living Arrangements/Services Living arrangements for the past 2 months: Single Family Home Lives with:: Adult Children Patient language and need for interpreter reviewed:: Yes Do you feel safe going back to the place where you live?: Yes      Need for Family Participation in Patient Care:  Yes (Comment) Care giver support system in place?: Yes (comment) Current home services: DME Criminal Activity/Legal Involvement Pertinent to Current Situation/Hospitalization: No - Comment as needed  Activities of Daily Living   ADL Screening (condition at time of admission) Independently performs ADLs?: Yes (appropriate for developmental age) Is the patient deaf or have difficulty hearing?: No Does the patient have difficulty seeing, even when wearing glasses/contacts?: No Does the patient have difficulty concentrating, remembering, or making decisions?: No  Permission Sought/Granted Permission sought to share information with : Facility Medical sales representative, Family Supports Permission granted to share information with : Yes, Verbal Permission Granted     Permission granted to share info w AGENCY: [facility]  Permission granted to share info w Relationship: Son     Emotional Assessment Appearance:: Appears stated age Attitude/Demeanor/Rapport: Engaged, Gracious Affect (typically observed): Accepting, Appropriate, Calm, Pleasant Orientation: : Oriented to Self, Oriented to Place, Oriented to  Time, Oriented to Situation Alcohol  / Substance Use: Not Applicable Psych Involvement: No (comment)  Admission diagnosis:  Syncope [R55] Recurrent syncope [R55] SIRS (systemic inflammatory response syndrome) (HCC) [R65.10] Patient Active Problem List   Diagnosis Date Noted   SIRS (systemic inflammatory response syndrome) (HCC) 11/17/2023   Syncope 11/14/2023   Essential hypertension 11/14/2023   Spondylosis without myelopathy or radiculopathy, lumbosacral region 11/13/2023   Chronic low back pain (Right) w/o sciatica 10/30/2023  Lumbar facet joint syndrome 10/30/2023   Chronic lower extremity pain (2ry area of Pain) (Right) 09/04/2023   Lumbosacral radiculopathy at L5 (Right) 09/04/2023   Lumbosacral radiculopathy at S1 (Right) 09/04/2023   DDD, lumbosacral w/ LBP & LEP  09/04/2023   Lumbar facet hypertrophy (Multilevel) (Bilateral) 09/04/2023   Lumbar foraminal stenosis 09/04/2023   Chronic pain syndrome 08/15/2023   Pharmacologic therapy 08/15/2023   Disorder of skeletal system 08/15/2023   Problems influencing health status 08/15/2023   Hypocalcemia 08/15/2023   Decreased GFR 08/15/2023   Elevated hemoglobin A1c 08/15/2023   Abnormal CT scan, cervical spine (07/24/2021) 08/15/2023   Abnormal MRI, lumbar spine (05/09/2023) 08/15/2023   Chronic low back pain (1ry area of Pain) (Bilateral) (R>L) w/ sciatica (Right) 08/15/2023   Low back pain radiating to leg (Right) 08/15/2023   Pain of lateral upper thigh (Right) (Intermittent) 08/15/2023   Chronic sacroiliac joint pain (Right) 08/15/2023   Somatic dysfunction of sacroiliac joint (Right) 08/15/2023   Grade 1 Anterolisthesis of lumbosacral spine of L3/L4 and L5/S1 08/15/2023   Lumbar facet joint pain 08/15/2023   Hypertensive emergency 05/10/2023   Peripheral vertigo 05/10/2023   Acute sinusitis 05/10/2023   A-fib s/p ligation of left atrial appendage 05/10/2023   Thickened endometrium 09/13/2022   Anemia of chronic disease 02/05/2022   Status post laparoscopic hernia repair 10/28/2020   AKI (acute kidney injury) (HCC) 04/13/2020   Acute hyponatremia 04/13/2020   Dehydration 04/13/2020   Diarrhea 04/13/2020   Abdominal pain 04/13/2020   Recurrent syncope 03/28/2020   CAD s/p CABG (coronary artery disease) 03/28/2020   Chronic kidney disease, stage III (moderate) (HCC) 02/12/2019   Background diabetic retinopathy associated with type 2 diabetes mellitus (HCC) 12/06/2018   Decreased activities of daily living (ADL) 06/05/2018   Decreased strength, endurance, and mobility 06/05/2018   S/P CABG x 3 06/04/2018   Presence of drug coated stent in LAD coronary artery 05/27/2018   Anxiety 05/25/2018   Atrial fibrillation and flutter (HCC) 05/25/2018   H/O gastroesophageal reflux (GERD) 05/25/2018    Hyperlipidemia 05/25/2018   Unstable angina (HCC) 05/21/2018   Chest pain 05/20/2018   Sacroiliitis (HCC) 02/21/2018   Positional lightheadedness 03/14/2017   Diabetes mellitus type 2, insulin  dependent (HCC) 03/14/2017   Action tremor 09/11/2016   Headache disorder 09/11/2016   Severe sepsis (HCC) 04/14/2016   Orthostatic hypotension 11/10/2015   Diabetic neuropathy (HCC) 10/06/2015   Urinary tract infection without hematuria 09/13/2015   Cystocele, grade 3 09/13/2015   Incontinence 09/13/2015   Depression, major, in remission (HCC) 12/23/2014   Pernicious anemia 12/23/2014   Coronary arteriosclerosis 01/06/2014   OSA on CPAP 01/06/2014   Benign essential hypertension 12/01/2013   Severe obesity (BMI 35.0-39.9) with comorbidity (HCC) 12/01/2013   Acquired cyst of kidney 05/02/2013   Chronic cystitis 03/11/2012   Urge incontinence 03/11/2012   PCP:  Little Riff, MD Pharmacy:   [id number] [place], [place] - [address] AT [facility] OF [address] [place] [address] Phone: [phone] Fax: [phone]     Social Drivers of Health (SDOH) Social History: SDOH Screenings   Food Insecurity: No Food Insecurity (11/14/2023)  Housing: Low Risk  (11/14/2023)  Transportation Needs: No Transportation Needs (11/14/2023)  Utilities: Not At Risk (11/14/2023)  Depression (PHQ2-9): Low Risk  (10/30/2023)  Financial Resource Strain: Low Risk  (02/22/2023)   Received from [facility]  Social Connections: Patient Declined (11/14/2023)  Tobacco Use: Low Risk  (11/14/2023)   SDOH Interventions:     Readmission Risk Interventions    11/19/2023   12:35 PM  Readmission Risk Prevention Plan  Transportation Screening Complete  PCP or Specialist Appt within 3-5 Days Complete  HRI or Home Care Consult Complete  Social Work Consult for Recovery Care Planning/Counseling Complete  Palliative Care Screening Not Applicable   Medication Review Oceanographer) Complete

## 2023-11-19 NOTE — Discharge Summary (Signed)
 Physician Discharge Summary   Patient: Brandi [name] MRN: [id number] DOB: Oct 28, 1935  Admit date:     11/14/2023  Discharge date: 11/19/23   Discharge Physician: Roise Cleaver   PCP: Little Riff, MD   Recommendations at discharge:   Follow-up with primary care and endocrinology to discuss antihypertensives and diabetes medication management.  Discharge Diagnoses: Principal Problem:   Syncope Active Problems:   Recurrent syncope   S/P CABG x 3   Hyperlipidemia   Coronary arteriosclerosis   Depression, major, in remission (HCC)   Diabetic neuropathy (HCC)   OSA on CPAP   Diabetes mellitus type 2, insulin  dependent (HCC)   Anxiety   Presence of drug coated stent in LAD coronary artery   Chronic pain syndrome   Essential hypertension   SIRS (systemic inflammatory response syndrome) (HCC)  Resolved Problems:   HTN (hypertension), malignant  Hospital Course: [name] is an 88 year old female with insulin -dependent diabetes, atrial fibrillation and a flutter, not on anticoagulation, hypertension, hyperlipidemia, anxiety, depression, CAD status post CABG times 08/2017 who presents to the ED with near syncopal episode with associated hypotension.  Patient has had multiple prior ED evaluations for similar events.  On arrival to the ED this admission vital signs are stable.  Lab work is largely unremarkable. On further evaluation appears the patient is on multiple antihypertensives.  She was found to be profoundly orthostatic with > 100 mmHg drop and subsequent tachycardia with position changes.  Her blood pressure medications were de-escalated, TED hose applied, and she had some improvement in her orthostasis.  No further syncopal episodes while admitted. She was also monitored on telemetry without any acute events.  Workup otherwise unremarkable including EEG, brain MRI, echocardiogram. We also discussed extensively her diabetes regimen.  Most recently patient's hemoglobin A1c  is 6.1% and she was started on Ozempic which she has been gradually increasing.  She reports on review of her CGM she has consistent lows into the 60s.  We discussed that Ozempic is a poor choice for her given her comorbidities and prolonged washout period making it difficult to treat and follow her hypoglycemic episodes.  We also recommend holding her sulfonylurea which could also be contributing to hypoglycemic episodes.  We have decreased her daily glargine. She endorses understanding.  I recommend she follow-up with her endocrinologist to discuss alternative medication options.  Repeat hemoglobin A1c here reveals 7.6%, which may be a more appropriate goal for this patient given her age and comorbidities. On 5/30 patient reported she was feeling much better and was requesting for discharge home. On presentation of discharge papers patient endorsed constipation and requested an enema.  She received enema and had small but not significant bowel movement.  KUB reveals moderate stool burden. She reports last bowel movement was 2 weeks prior to arrival.  Discharge was canceled and patient was started on aggressive bowel regimen.  She had multiple bowel movements and constipation resolved Overnight on 5/31 patient became increasingly tachycardic with increased respirations and had a fever.  Sepsis workup was initiated.  CTA chest was performed and ultimately unremarkable.  Patient did have rising leukocytosis.  She was ultimately found to have pansensitive E. coli UTI and initiated on treatment.  By evaluation on 6/2 patient's leukocytosis was resolving, she had no further fever in 48 hours, and she was reporting feeling back to her physiologic baseline and ready to discharge home. Care plan was discussed with the patient as well as with her sons at bedside.  SIRS  Leukocytosis - SIRS criteria met with tachycardia, tachypnea, fever x 1 - Blood cultures remain negative - Urine cultures positive for pansensitive  E. coli.  Status post 3 doses ceftriaxone .  Will continue with an additional 4 days of Keflex  given systemic symptoms while admitted - Leukocytosis resolving now - CTA without evidence of PE, notes hiatal hernia and known cardiac atherosclerosis - Lower extremity Dopplers negative for DVT - No further fever   Constipation - Patient reveals that she has not had a bowel movement for 2 weeks prior to arrival - KUB with moderate stool burden - Initiated on enema lactulose  5/31.  Patient has now had multiple bowel movements and would like to discontinue further lactulose  - Will need daily MiraLAX  for maintenance outpatient   Syncope -Multifactorial.  Likely secondary to profound orthostasis +/- hypoglycemia.  Orthostatic vital signs reveal lying blood pressure: 222/68, pulse 77.  Standing blood pressure 117/80, pulse 101 - Suspect polypharmacy is playing a role.   --Repeat orthostatics are significantly improved after de-escalation - Echocardiogram: LVEF 60 to 65%, no regional wall motion abnormalities, mild left ventricular hypertrophy, grade 1 diastolic dysfunction.  Mild mitral valve regurg, no aortic stenosis --EKG on arrival: Sinus rhythm, low voltage.  No obvious arrhythmia or ST changes - High-sensitivity troponin: 6 -No events on telemetry - Brain MRI: Extensive chronic small vessel ischemic disease, enlarged empty sella unchanged from prior exams. - EEG: WNL -PT/OT Home health ordered   Hypokalemia - Continue to replace as needed - Trend daily CMP   Orthostatic hypotension - Lying blood pressure is extremely elevated, SBP consistently above 180.  When standing has >187mmg hg drop. -De-escalated antihypertensives at discharge - Repeat orthostatics are improved - Continue to wear TED hose   Polypharmacy -Patient is taking multiple antihistamines, and multiple antihypertensives.  Home medication list includes: Pepcid , meclizine , loratadine , Myrbetriq . Coreg , gabapentin  400 mg 3  times daily, losartan , verapamil , hydralazine  -Have deprescribed these medications.  Will continue to titrate while admitted. - Decrease dose of gabapentin , consider complete taper outpatient   Hypertension - As above.  Medications have been de-escalated - Continue close monitoring and daily titration.  Currently blood pressure is at goal   Chronic pain syndrome - PDMP reviewed - Currently on gabapentin  400 mg 3 times daily, given concerns of syncope we will resume this at lower dose   Anxiety - Home meds    Type 2 diabetes, insulin -dependent -Hemoglobin A1c 7.6%.  Patient currently taking sulfonylurea and Ozempic.  Is having hypoglycemic episodes at home, reports her CBG shows consistent lows in the 60s - Discontinue Ozempic and glimepiride.  Decrease home dose glargine.  Follow-up closely with endocrinology   CAD status post CABGx3, 2019 - Resume aspirin , statin.  Antihypertensive control as above   Atrial fibrillation, not on anticoagulation - Initially started patient on lower dose Coreg  given orthostasis as above.  Appears to becoming increasingly tachycardic.  Have resumed at higher dose of Coreg   Diet recommendation:  Discharge Diet Orders (From admission, onward)     Start     Ordered   11/19/23 0000  Diet general        11/19/23 1251   11/16/23 0000  Diet general        11/16/23 1517           Carb modified diet DISCHARGE MEDICATION: Allergies as of 11/19/2023       Reactions   Accupril [quinapril Hcl] Rash   Micardis [telmisartan] Other (See Comments)   Dizziness  Quinapril Rash        Medication List     STOP taking these medications    famotidine  20 MG tablet Commonly known as: PEPCID    glipiZIDE  5 MG tablet Commonly known as: GLUCOTROL    hydrALAZINE  10 MG tablet Commonly known as: APRESOLINE    loratadine  10 MG tablet Commonly known as: CLARITIN    meclizine  12.5 MG tablet Commonly known as: ANTIVERT    Ozempic (1 MG/DOSE) 4 MG/3ML  Sopn Generic drug: Semaglutide (1 MG/DOSE)   Verapamil  HCl CR 300 MG Cp24       TAKE these medications    acetaminophen  500 MG tablet Commonly known as: TYLENOL  Take 1,000 mg by mouth 3 (three) times daily.   aspirin  EC 81 MG tablet Take 81 mg by mouth daily.   atorvastatin  40 MG tablet Commonly known as: LIPITOR Take 40 mg by mouth every evening.   budesonide-formoterol  80-4.5 MCG/ACT inhaler Commonly known as: SYMBICORT Inhale 2 puffs into the lungs 2 (two) times daily.   CALCIUM  CARBONATE+VITAMIN D  PO Take 1 tablet by mouth daily at 6 (six) AM.   carboxymethylcellulose 0.5 % Soln Commonly known as: REFRESH PLUS Apply 1-2 drops to eye at bedtime.   carvedilol  25 MG tablet Commonly known as: COREG  Take 1 tablet (25 mg total) by mouth 2 (two) times daily with a meal. What changed: how much to take   cephALEXin  500 MG capsule Commonly known as: KEFLEX  Take 1 capsule (500 mg total) by mouth in the morning and at bedtime for 4 days.   ferrous sulfate  325 (65 FE) MG tablet Take 325 mg by mouth daily with breakfast.   gabapentin  300 MG capsule Commonly known as: NEURONTIN  Take 1 capsule (300 mg total) by mouth 3 (three) times daily. What changed:  medication strength how much to take   Lantus  SoloStar 100 UNIT/ML Solostar Pen Generic drug: insulin  glargine Inject 5 Units into the skin every morning. What changed: how much to take   losartan  100 MG tablet Commonly known as: COZAAR  Take 100 mg by mouth at bedtime.   Melatonin 10 MG Tabs Take 1 tablet by mouth at bedtime.   mirabegron  ER 50 MG Tb24 tablet Commonly known as: MYRBETRIQ  Take 1 tablet (50 mg total) by mouth daily.   nitroGLYCERIN  0.4 MG SL tablet Commonly known as: NITROSTAT  Place 0.4 mg under the tongue every 5 (five) minutes as needed for chest pain.   phosphorus 155-852-130 MG tablet Commonly known as: K PHOS NEUTRAL Take 2 tablets (500 mg total) by mouth once for 1 dose.    PreserVision/Lutein  Caps Take 1 capsule by mouth 2 (two) times daily.   progesterone  100 MG capsule Commonly known as: PROMETRIUM  Take 100 mg by mouth at bedtime.   sodium chloride  0.65 % nasal spray Commonly known as: OCEAN Place 1 spray into the nose as needed.   VITAMIN B-12 PO Take 1 tablet by mouth daily.               Durable Medical Equipment  (From admission, onward)           Start     Ordered   11/18/23 0759  For home use only DME Bedside commode  Once       Question:  Patient needs a bedside commode to treat with the following condition  Answer:  Orthostatic hypotension   11/18/23 0758            Follow-up Information     [facility]  Follow up.   Specialty: Home Health Services Why: They will follow up with you for your home health therapy needs. Contact information: [address] [place] [address] [id number]                Discharge Exam: Filed Weights   11/17/23 0500 11/18/23 0520 11/19/23 0500  Weight: 82.9 kg 85 kg 87.3 kg   Constitutional:  Normal appearance. Non toxic-appearing.  HENT: Head Normocephalic and atraumatic.  Mucous membranes are moist.  Eyes:  Extraocular intact. Conjunctivae normal. Pupils are equal, round, and reactive to light.  Cardiovascular: Rate and Rhythm: Normal rate and regular rhythm.  Pulmonary: Non labored, symmetric rise of chest wall. Abdomen: Soft, nontender. Musculoskeletal:  Normal range of motion.  Skin: warm and dry. not jaundiced.  Neurological: No focal deficit present. alert. Oriented. Psychiatric: Mood and Affect congruent.   Condition at discharge: stable  The results of significant diagnostics from this hospitalization (including imaging, microbiology, ancillary and laboratory) are listed below for reference.   Imaging Studies: US  Venous Img Lower Bilateral (DVT) Result Date: 11/18/2023 CLINICAL DATA:  Bilateral lower extremity swelling. EXAM: BILATERAL LOWER  EXTREMITY VENOUS DOPPLER ULTRASOUND TECHNIQUE: Gray-scale sonography with graded compression, as well as color Doppler and duplex ultrasound were performed to evaluate the lower extremity deep venous systems from the level of the common femoral vein and including the common femoral, femoral, profunda femoral, popliteal and calf veins including the posterior tibial, peroneal and gastrocnemius veins when visible. The superficial great saphenous vein was also interrogated. Spectral Doppler was utilized to evaluate flow at rest and with distal augmentation maneuvers in the common femoral, femoral and popliteal veins. COMPARISON:  Right lower extremity DVT study 10/18/2022 FINDINGS: RIGHT LOWER EXTREMITY Common Femoral Vein: No evidence of thrombus. Normal compressibility, respiratory phasicity and response to augmentation. Saphenofemoral Junction: No evidence of thrombus. Normal compressibility and flow on color Doppler imaging. Profunda Femoral Vein: No evidence of thrombus. Normal compressibility and flow on color Doppler imaging. Femoral Vein: No evidence of thrombus. Normal compressibility, respiratory phasicity and response to augmentation. Popliteal Vein: No evidence of thrombus. Normal compressibility, respiratory phasicity and response to augmentation. Calf Veins: No evidence of thrombus. Other Findings:  None. LEFT LOWER EXTREMITY Common Femoral Vein: No evidence of thrombus. Normal compressibility, respiratory phasicity and response to augmentation. Saphenofemoral Junction: No evidence of thrombus. Normal compressibility and flow on color Doppler imaging. Profunda Femoral Vein: No evidence of thrombus. Normal compressibility and flow on color Doppler imaging. Femoral Vein: No evidence of thrombus. Normal compressibility, respiratory phasicity and response to augmentation. Popliteal Vein: No evidence of thrombus.  Normal compressibility. Calf Veins: No evidence of thrombus. Normal compressibility and flow on  color Doppler imaging. Other Findings:  None. IMPRESSION: No evidence of deep venous thrombosis in either lower extremity. Electronically Signed   By: Donnal Fusi M.D.   On: 11/18/2023 11:47   CT Angio Chest Pulmonary Embolism (PE) W or WO Contrast Result Date: 11/17/2023 CLINICAL DATA:  PE EXAM: CT ANGIOGRAPHY CHEST WITH CONTRAST TECHNIQUE: Multidetector CT imaging of the chest was performed using the standard protocol during bolus administration of intravenous contrast. Multiplanar CT image reconstructions and MIPs were obtained to evaluate the vascular anatomy. RADIATION DOSE REDUCTION: This exam was performed according to the departmental dose-optimization program which includes automated exposure control, adjustment of the mA and/or kV according to patient size and/or use of iterative reconstruction technique. CONTRAST:  75mL OMNIPAQUE  IOHEXOL  350 MG/ML SOLN COMPARISON:  None Available. FINDINGS:  Cardiovascular: No filling defects in pulmonary is to suggest PE. No cardiomegaly or pericardial effusion. There is marked atheromatous calcification of the aorta and its branches. No aneurysm. Status post median sternotomy and CABG. Mediastinum/Nodes: No abnormality of the thyroid. No suspicious adenopathy. Lungs/Pleura: Linear basilar subsegmental atelectasis or scarring on the right. No pneumothorax or pleural effusions. Upper Abdomen: Moderate hiatal hernia. Musculoskeletal: There are thoracic degenerative changes. Review of the MIP images confirms the above findings. IMPRESSION: 1. No evidence of PE. 2. Aortic atherosclerosis (ICD10-I70.0). 3. Hiatal hernia. Electronically Signed   By: Sydell Eva M.D.   On: 11/17/2023 09:19   DG Chest Port 1 View Result Date: 11/17/2023 CLINICAL DATA:  88 year old female with tachycardia. EXAM: PORTABLE CHEST 1 VIEW COMPARISON:  Portable chest 11/14/2023 and earlier. FINDINGS: Portable AP semi upright view at 0517 hours. Chronic CABG. Left chest cardiac loop recorder  or superficial ICD. Calcified aortic atherosclerosis. Stable cardiac size and mediastinal contours. Visualized tracheal air column is within normal limits. Larger lung volumes. Allowing for portable technique the lungs are clear. No pneumothorax or pleural effusion. No acute osseous abnormality identified. Paucity of visible bowel gas. IMPRESSION: No acute cardiopulmonary abnormality. Electronically Signed   By: Marlise Simpers M.D.   On: 11/17/2023 05:29   DG Abd 1 View Result Date: 11/16/2023 CLINICAL DATA:  Constipation.  Stool impaction. EXAM: ABDOMEN - 1 VIEW COMPARISON:  CT abdomen and pelvis 02/22/2023 FINDINGS: Gas and stool throughout the colon with moderately prominent colonic stool suggesting moderate stool burden. Gas-filled nondistended small bowel. No colonic distention. No radiopaque stones. Surgical clips in the right upper quadrant. Degenerative changes in the spine and hips. Vascular calcifications. Old right pubic ramus fractures. Visualized lung bases are clear. IMPRESSION: Nonobstructive bowel gas pattern with moderately prominent colonic stool. Electronically Signed   By: Boyce Byes M.D.   On: 11/16/2023 18:46   ECHOCARDIOGRAM COMPLETE Result Date: 11/15/2023    ECHOCARDIOGRAM REPORT   Patient Name:   RAINA SOLE Date of Exam: 11/14/2023 Medical Rec #:  [id number]     Height:       61.0 in Accession #:    [id number]    Weight:       188.9 lb Date of Birth:  05-29-1936     BSA:          1.844 m Patient Age:    87 years      BP:           146/54 mmHg Patient Gender: F             HR:           99 bpm. Exam Location:  [facility] Procedure: 2D Echo, Cardiac Doppler and Color Doppler (Both Spectral and Color            Flow Doppler were utilized during procedure). Indications:     R55 Syncope  History:         Patient has prior history of Echocardiogram examinations, most                  recent 05/11/2023. CAD and NSTEMI., Prior CABG,                  Arrythmias:Atrial Fibrillation and Atrial  Flutter,                  Signs/Symptoms:Syncope, Dyspnea and Murmur; Risk                  Factors:Hypertension  and Dyslipidemia. Obstructive sleep                  apnea-CPAP.  Sonographer:     Brigid Canada RDCS Referring Phys:  [id number] AMY N [facility] Diagnosing Phys: Percival Brace MD IMPRESSIONS  1. Left ventricular ejection fraction, by estimation, is 60 to 65%. The left ventricle has normal function. The left ventricle has no regional wall motion abnormalities. There is mild left ventricular hypertrophy. Left ventricular diastolic parameters are consistent with Grade I diastolic dysfunction (impaired relaxation).  2. Right ventricular systolic function is normal. The right ventricular size is normal.  3. The mitral valve is normal in structure. Mild mitral valve regurgitation. No evidence of mitral stenosis.  4. The aortic valve is normal in structure. Aortic valve regurgitation is not visualized. No aortic stenosis is present.  5. The inferior vena cava is normal in size with greater than 50% respiratory variability, suggesting right atrial pressure of 3 mmHg. FINDINGS  Left Ventricle: Left ventricular ejection fraction, by estimation, is 60 to 65%. The left ventricle has normal function. The left ventricle has no regional wall motion abnormalities. Strain was performed and the global longitudinal strain is indeterminate. The left ventricular internal cavity size was normal in size. There is mild left ventricular hypertrophy. Left ventricular diastolic parameters are consistent with Grade I diastolic dysfunction (impaired relaxation). Right Ventricle: The right ventricular size is normal. No increase in right ventricular wall thickness. Right ventricular systolic function is normal. Left Atrium: Left atrial size was normal in size. Right Atrium: Right atrial size was normal in size. Pericardium: There is no evidence of pericardial effusion. Mitral Valve: The mitral valve is normal in structure. Mild  mitral valve regurgitation. No evidence of mitral valve stenosis. Tricuspid Valve: The tricuspid valve is normal in structure. Tricuspid valve regurgitation is mild . No evidence of tricuspid stenosis. The aortic valve is normal in structure. Aortic valve regurgitation is not visualized. No aortic stenosis is present. Pulmonic Valve: The pulmonic valve was normal in structure. Pulmonic valve regurgitation is not visualized. No evidence of pulmonic stenosis. Aorta: The aortic root is normal in size and structure. Venous: The inferior vena cava is normal in size with greater than 50% respiratory variability, suggesting right atrial pressure of 3 mmHg. IAS/Shunts: No atrial level shunt detected by color flow Doppler. Additional Comments: 3D was performed not requiring image post processing on an independent workstation and was indeterminate.  LEFT VENTRICLE PLAX 2D LVIDd:         3.70 cm   Diastology LVIDs:         2.60 cm   LV e' medial:    5.40 cm/s LV PW:         1.30 cm   LV E/e' medial:  16.4 LV IVS:        1.20 cm   LV e' lateral:   7.87 cm/s LVOT diam:     1.90 cm   LV E/e' lateral: 11.3 LV SV:         41 LV SV Index:   22 LVOT Area:     2.84 cm  RIGHT VENTRICLE RV Basal diam:  3.30 cm RV S prime:     7.81 cm/s TAPSE (M-mode): 2.3 cm LEFT ATRIUM             Index        RIGHT ATRIUM           Index LA diam:  4.30 cm 2.33 cm/m   RA Area:     13.40 cm LA Vol (A2C):   42.3 ml 22.94 ml/m  RA Volume:   33.10 ml  17.95 ml/m LA Vol (A4C):   37.6 ml 20.39 ml/m LA Biplane Vol: 41.8 ml 22.67 ml/m  AORTIC VALVE AV Area (Vmax):    1.23 cm AV Area (Vmean):   1.20 cm AV Area (VTI):     1.32 cm AV Vmax:           190.00 cm/s AV Vmean:          131.333 cm/s AV VTI:            0.313 m AV Peak Grad:      14.4 mmHg AV Mean Grad:      8.0 mmHg LVOT Vmax:         82.70 cm/s LVOT Vmean:        55.440 cm/s LVOT VTI:          0.145 m LVOT/AV VTI ratio: 0.46  AORTA Ao Root diam: 2.80 cm Ao Asc diam:  2.80 cm MITRAL VALVE  MV Area (PHT): 4.50 cm     SHUNTS MV Decel Time: 169 msec     Systemic VTI:  0.15 m MV E velocity: 88.57 cm/s   Systemic Diam: 1.90 cm MV A velocity: 130.33 cm/s MV E/A ratio:  0.68 Percival Brace MD Electronically signed by Percival Brace MD Signature Date/Time: 11/15/2023/1:23:32 PM    Final    EEG adult Result Date: 11/15/2023 Arleene Lack, MD     11/15/2023  8:39 AM Patient Name: ANGELI DEMILIO MRN: [id number] Epilepsy Attending: Arleene Lack Referring Physician/Provider: Cedric Cohn, DO Date: 11/14/2023 Duration: 26.43 mins Patient history: 88yo F with syncope. EEG to evaluate fro seizure Level of alertness: Awake AEDs during EEG study: None Technical aspects: This EEG study was done with scalp electrodes positioned according to the 10-20 International system of electrode placement. Electrical activity was reviewed with band pass filter of 1-70Hz , sensitivity of 7 uV/mm, display speed of 58mm/sec with a 60Hz  notched filter applied as appropriate. EEG data were recorded continuously and digitally stored.  Video monitoring was available and reviewed as appropriate. Description: The posterior dominant rhythm consists of 8-9 Hz activity of moderate voltage (25-35 uV) seen predominantly in posterior head regions, symmetric and reactive to eye opening and eye closing. Hyperventilation and photic stimulation were not performed.   IMPRESSION: This study is within normal limits. No seizures or epileptiform discharges were seen throughout the recording. A normal interictal EEG does not exclude the diagnosis of epilepsy. Arleene Lack   MR BRAIN WO CONTRAST Result Date: 11/14/2023 CLINICAL DATA:  Neuro deficit, acute, stroke suspected. Multiple syncope, near syncope events over the last 2 months. EXAM: MRI HEAD WITHOUT CONTRAST TECHNIQUE: Multiplanar, multiecho pulse sequences of the brain and surrounding structures were obtained without intravenous contrast. COMPARISON:  Head MRI 05/10/2023  FINDINGS: Brain: There is no evidence of an acute infarct, intracranial hemorrhage, mass, midline shift, or extra-axial fluid collection. Patchy to confluent T2 hyperintensities in the cerebral white matter bilaterally and in the pons are stable to mildly increased and nonspecific but compatible with extensive chronic small vessel ischemic disease. There is mild generalized cerebral atrophy. An enlarged, empty sella is unchanged. Vascular: Major intracranial vascular flow voids are preserved. Skull and upper cervical spine: Unremarkable bone marrow signal. Sinuses/Orbits: Bilateral cataract extraction. Mild mucosal thickening in the right sphenoid and left maxillary sinuses.  No significant mastoid fluid. Other: None. IMPRESSION: 1. No acute intracranial abnormality. 2. Extensive chronic small vessel ischemic disease. Electronically Signed   By: Aundra Lee M.D.   On: 11/14/2023 19:00   Portable Chest 1 View Result Date: 11/14/2023 CLINICAL DATA:  Near-syncope this morning. EXAM: PORTABLE CHEST 1 VIEW COMPARISON:  09/19/2023 FINDINGS: Normal sized heart. Stable loop recorder. Stable post CABG changes and left atrial clip. Clear lungs with normal vascularity. Tortuous and partially calcified thoracic aorta. Diffuse osteopenia. IMPRESSION: No active disease. Electronically Signed   By: Catherin Closs M.D.   On: 11/14/2023 15:50   DG PAIN CLINIC C-ARM 1-60 MIN NO REPORT Result Date: 11/13/2023 Fluoro was used, but no Radiologist interpretation will be provided. Please refer to "NOTES" tab for provider progress note.   Microbiology: Results for orders placed or performed during the hospital encounter of 11/14/23  Urine Culture (for pregnant, neutropenic or urologic patients or patients with an indwelling urinary catheter)     Status: Abnormal   Collection Time: 11/17/23  6:10 AM   Specimen: Urine, Random  Result Value Ref Range Status   Specimen Description   Final    URINE, RANDOM Performed at [facility], [address] [place], [place] [address]    Special Requests   Final    NONE Performed at [facility], [address] [place], [place] [address]    Culture >=100,000 COLONIES/mL ESCHERICHIA COLI (A)  Final   Report Status 11/19/2023 FINAL  Final   Organism ID, Bacteria ESCHERICHIA COLI (A)  Final      Susceptibility   Escherichia coli - MIC*    AMPICILLIN 4 SENSITIVE Sensitive     CEFAZOLIN  <=4 SENSITIVE Sensitive     CEFEPIME <=0.12 SENSITIVE Sensitive     CEFTRIAXONE  <=0.25 SENSITIVE Sensitive     CIPROFLOXACIN  <=0.25 SENSITIVE Sensitive     GENTAMICIN <=1 SENSITIVE Sensitive     IMIPENEM <=0.25 SENSITIVE Sensitive     NITROFURANTOIN <=16 SENSITIVE Sensitive     TRIMETH /SULFA  <=20 SENSITIVE Sensitive     AMPICILLIN/SULBACTAM <=2 SENSITIVE Sensitive     PIP/TAZO <=4 SENSITIVE Sensitive ug/mL    * >=100,000 COLONIES/mL ESCHERICHIA COLI  Culture, blood (Routine X 2) w Reflex to ID Panel     Status: None (Preliminary result)   Collection Time: 11/17/23  9:18 AM   Specimen: BLOOD  Result Value Ref Range Status   Specimen Description BLOOD LEFT ANTECUBITAL  Final   Special Requests   Final    BOTTLES DRAWN AEROBIC ONLY Blood Culture results may not be optimal due to an inadequate volume of blood received in culture bottles   Culture   Final    NO GROWTH < 24 HOURS Performed at [facility], [address] [place], [place] [address]    Report Status PENDING  Incomplete  Culture, blood (Routine X 2) w Reflex to ID Panel     Status: None (Preliminary result)   Collection Time: 11/17/23  9:22 AM   Specimen: BLOOD  Result Value Ref Range Status   Specimen Description BLOOD RIGHT ANTECUBITAL  Final   Special Requests   Final    BOTTLES DRAWN AEROBIC AND ANAEROBIC Blood Culture adequate volume   Culture   Final    NO GROWTH < 24 HOURS Performed at [facility], [address], [place], [place] [address]    Report Status PENDING   Incomplete    Labs: CBC: Recent Labs  Lab 11/14/23 1148 11/15/23  4098 11/16/23 0454 11/17/23 0534 11/18/23 0616 11/19/23 0521  WBC 8.6 6.6 6.5 15.3* 23.3* 15.1*  NEUTROABS 5.3  --  3.0 12.9* 19.8* 11.6*  HGB 10.8* 11.3* 10.7* 11.9* 10.9* 10.6*  HCT 34.5* 34.8* 32.8* 35.7* 32.6* 31.9*  MCV 92.0 89.2 88.4 87.3 87.6 87.2  PLT 228 238 224 256 252 237   Basic Metabolic Panel: Recent Labs  Lab 11/15/23 0550 11/16/23 0454 11/17/23 0534 11/18/23 0616 11/19/23 0521  NA 140 138 137 137 135  K 3.6 3.2* 3.6 3.0* 4.1  CL 107 106 105 105 103  CO2 28 25 22 24 25   GLUCOSE 87 67* 158* 183* 130*  BUN 11 12 11 15 16   CREATININE 0.70 0.62 0.75 0.83 0.84  CALCIUM  8.1* 7.9* 7.8* 7.5* 7.6*  MG  --  1.7 1.7 1.8 1.9  PHOS  --  3.2 4.3 3.8 1.7*   Liver Function Tests: Recent Labs  Lab 11/16/23 0454 11/17/23 0534 11/18/23 0616 11/19/23 0521  AST 18 19 17 16   ALT 21 24 22 22   ALKPHOS 77 86 84 86  BILITOT 0.6 1.0 1.0 0.8  PROT 4.0* 4.5* 4.3* 4.2*  ALBUMIN 1.9* 2.1* 2.0* 1.9*   CBG: Recent Labs  Lab 11/18/23 1226 11/18/23 1622 11/18/23 2043 11/19/23 0747 11/19/23 1224  GLUCAP 165* 123* 129* 162* 244*    Discharge time spent: 32 minutes.  Signed: Calan Doren, DO [facility] 11/19/2023

## 2023-11-19 NOTE — Care Management Important Message (Signed)
 Important Message  Patient Details  Name: Brandi [name] MRN: [id number] Date of Birth: [birth year]   Important Message Given:  Yes - Medicare IM     Anise Kerns 11/19/2023, 1:02 PM

## 2023-11-19 NOTE — TOC Transition Note (Signed)
 Transition of Care [facility]) - Discharge Note   Patient Details  Name: Brandi [name] MRN: [id number] Date of Birth: 07/17/1935  Transition of Care [facility]) CM/SW Contact:  Odilia Bennett, LCSW Phone Number: 11/19/2023, 12:54 PM   Clinical Narrative:   Patient has orders to discharge home today. [facility] liaison is aware. No further concerns. CSW signing off.  Final next level of care: Home w Home Health Services Barriers to Discharge: Barriers Resolved   Patient Goals and CMS Choice   CMS Medicare.gov Compare Post Acute Care list provided to:: Patient Choice offered to / list presented to : Patient      Discharge Placement                Patient to be transferred to facility by: Son   Patient and family notified of of transfer: 11/19/23  Discharge Plan and Services Additional resources added to the After Visit Summary for       Post Acute Care Choice: Home Health                    HH Arranged: PT, OT [facility] Agency: [facility] Date [facility] Agency Contacted: 11/19/23   Representative spoke with at [facility] Agency: Verdis Glade  Social Drivers of Health (SDOH) Interventions SDOH Screenings   Food Insecurity: No Food Insecurity (11/14/2023)  Housing: Low Risk  (11/14/2023)  Transportation Needs: No Transportation Needs (11/14/2023)  Utilities: Not At Risk (11/14/2023)  Depression (PHQ2-9): Low Risk  (10/30/2023)  Financial Resource Strain: Low Risk  (02/22/2023)   Received from [facility]  Social Connections: Patient Declined (11/14/2023)  Tobacco Use: Low Risk  (11/14/2023)     Readmission Risk Interventions    11/19/2023   12:35 PM  Readmission Risk Prevention Plan  Transportation Screening Complete  PCP or Specialist Appt within 3-5 Days Complete  HRI or Home Care Consult Complete  Social Work Consult for Recovery Care Planning/Counseling Complete  Palliative Care Screening Not Applicable  Medication Review Oceanographer) Complete

## 2023-11-19 NOTE — Plan of Care (Signed)
  Problem: Education: Goal: Ability to describe self-care measures that may prevent or decrease complications (Diabetes Survival Skills Education) will improve Outcome: Progressing Goal: Individualized Educational Video(s) Outcome: Progressing   Problem: Coping: Goal: Ability to adjust to condition or change in health will improve Outcome: Progressing   Problem: Fluid Volume: Goal: Ability to maintain a balanced intake and output will improve Outcome: Progressing   Problem: Health Behavior/Discharge Planning: Goal: Ability to identify and utilize available resources and services will improve Outcome: Progressing Goal: Ability to manage health-related needs will improve Outcome: Progressing   Problem: Nutritional: Goal: Maintenance of adequate nutrition will improve Outcome: Progressing Goal: Progress toward achieving an optimal weight will improve Outcome: Progressing   Problem: Skin Integrity: Goal: Risk for impaired skin integrity will decrease Outcome: Progressing   Problem: Tissue Perfusion: Goal: Adequacy of tissue perfusion will improve Outcome: Progressing   Problem: Education: Goal: Knowledge of condition and prescribed therapy will improve Outcome: Progressing   Problem: Clinical Measurements: Goal: Ability to maintain clinical measurements within normal limits will improve Outcome: Progressing Goal: Will remain free from infection Outcome: Progressing Goal: Diagnostic test results will improve Outcome: Progressing Goal: Respiratory complications will improve Outcome: Progressing Goal: Cardiovascular complication will be avoided Outcome: Progressing   Problem: Elimination: Goal: Will not experience complications related to bowel motility Outcome: Progressing Goal: Will not experience complications related to urinary retention Outcome: Progressing   Problem: Coping: Goal: Level of anxiety will decrease Outcome: Progressing   Problem:  Nutrition: Goal: Adequate nutrition will be maintained Outcome: Progressing   Problem: Skin Integrity: Goal: Risk for impaired skin integrity will decrease Outcome: Progressing   Problem: Safety: Goal: Ability to remain free from injury will improve Outcome: Progressing

## 2023-11-19 NOTE — Progress Notes (Signed)
 Physical Therapy Treatment Patient Details Name: Brandi [name] MRN: [id number] DOB: 1935-12-10 Today's Date: 11/19/2023   History of Present Illness Patient is a 88 year old female with near syncope and hypotension. History of insulin -dependent diabetes, atrial fibrillation and a flutter, not on anticoagulation, hypertension, hyperlipidemia, anxiety, depression, CAD status post CABG.    PT Comments  In bed, ready to get up.  She stands and has small loose BM and transfers to [facility] for care and barrier cream applied.  She is able to walk 20' x 2 and 40' x 1 with RW and cga x 1 with occasional cues for hand placements.  Son in stated pt remains generally weak and not at baseline but does not voice concerns over discharge home.  Discussed +2 for home entry and use of gait belt for safety.  Voiced understanding.     If plan is discharge home, recommend the following: A little help with walking and/or transfers;A little help with bathing/dressing/bathroom;Help with stairs or ramp for entrance;Assistance with cooking/housework   Can travel by private vehicle        Equipment Recommendations  None recommended by PT    Recommendations for Other Services       Precautions / Restrictions Precautions Precautions: Fall Recall of Precautions/Restrictions: Intact Restrictions Weight Bearing Restrictions Per Provider Order: No     Mobility  Bed Mobility Overal bed mobility: Needs Assistance Bed Mobility: Supine to Sit, Sit to Supine     Supine to sit: HOB elevated, Used rails, Min assist Sit to supine: Mod assist   General bed mobility comments: assistance for LE support. Patient Response: Cooperative  Transfers Overall transfer level: Needs assistance Equipment used: Rolling walker (2 wheels) Transfers: Sit to/from Stand Sit to Stand: Contact guard assist           General transfer comment: occasional cues for hand placements    Ambulation/Gait Ambulation/Gait assistance:  Contact guard assist, Min assist Gait Distance (Feet): 40 Feet Assistive device: Rolling walker (2 wheels)   Gait velocity: dec     General Gait Details: 20;' x 2, 40' x 1 with break in chair outside of door   Stairs             Wheelchair Mobility     Tilt Bed Tilt Bed Patient Response: Cooperative  Modified Rankin (Stroke Patients Only)       Balance Overall balance assessment: Needs assistance Sitting-balance support: Feet supported Sitting balance-Leahy Scale: Fair     Standing balance support: Bilateral upper extremity supported Standing balance-Leahy Scale: Fair Standing balance comment: +1 for safety                            Communication Communication Communication: No apparent difficulties  Cognition Arousal: Alert Behavior During Therapy: WFL for tasks assessed/performed   PT - Cognitive impairments: No apparent impairments                         Following commands: Intact Following commands impaired: Only follows one step commands consistently    Cueing Cueing Techniques: Verbal cues  Exercises Other Exercises Other Exercises: BSC forsmall  BM upon standing.    General Comments        Pertinent Vitals/Pain Pain Assessment Pain Assessment: Faces Faces Pain Scale: Hurts whole lot Pain Location: bottom from loose BM's Pain Descriptors / Indicators: Sore Pain Intervention(s): Monitored during session, Repositioned    Home Living  Prior Function            PT Goals (current goals can now be found in the care plan section) Progress towards PT goals: Progressing toward goals    Frequency    Min 2X/week      PT Plan      Co-evaluation              AM-PAC PT "6 Clicks" Mobility   Outcome Measure  Help needed turning from your back to your side while in a flat bed without using bedrails?: A Little Help needed moving from lying on your back to sitting on the  side of a flat bed without using bedrails?: A Little Help needed moving to and from a bed to a chair (including a wheelchair)?: A Little Help needed standing up from a chair using your arms (e.g., wheelchair or bedside chair)?: A Little Help needed to walk in hospital room?: A Little Help needed climbing 3-5 steps with a railing? : A Lot 6 Click Score: 17    End of Session Equipment Utilized During Treatment: Gait belt Activity Tolerance: Patient tolerated treatment well Patient left: in bed;with bed alarm set;with call bell/phone within reach;with family/visitor present Nurse Communication: Mobility status PT Visit Diagnosis: Muscle weakness (generalized) (M62.81);Unsteadiness on feet (R26.81)     Time: 4098-1191 PT Time Calculation (min) (ACUTE ONLY): 25 min  Charges:    $Gait Training: 8-22 mins $Therapeutic Activity: 8-22 mins PT General Charges $$ ACUTE PT VISIT: 1 Visit                   Charlanne Cong, PTA 11/19/23, 9:35 AM

## 2023-11-19 NOTE — Progress Notes (Signed)
 Discharge teaching complete with patient and husband. Meds, diet, activity, follow up appointments reviewed and all questions answered. Copy of instructions given to patient and meds brought to bedside.

## 2023-11-22 LAB — CULTURE, BLOOD (ROUTINE X 2)
Culture: NO GROWTH
Culture: NO GROWTH
Special Requests: ADEQUATE

## 2023-12-17 ENCOUNTER — Ambulatory Visit (HOSPITAL_BASED_OUTPATIENT_CLINIC_OR_DEPARTMENT_OTHER): Admitting: Pain Medicine

## 2023-12-17 DIAGNOSIS — Z91199 Patient's noncompliance with other medical treatment and regimen due to unspecified reason: Secondary | ICD-10-CM

## 2023-12-17 DIAGNOSIS — Z09 Encounter for follow-up examination after completed treatment for conditions other than malignant neoplasm: Secondary | ICD-10-CM

## 2023-12-17 NOTE — Progress Notes (Signed)
 Department: [place] [facility]) Date: 12/17/2023  Encounter Type: Post-procedure evaluation (PPE).          Event: NO SHOW.          Unfortunately, this may have some unintended consequences where we will not be able to obtain adequate/accurate information from the diagnostic portion of the procedure. Reason: None communicated.

## 2024-01-23 ENCOUNTER — Other Ambulatory Visit: Payer: Self-pay | Admitting: Internal Medicine

## 2024-01-23 DIAGNOSIS — Z1231 Encounter for screening mammogram for malignant neoplasm of breast: Secondary | ICD-10-CM

## 2024-01-24 ENCOUNTER — Ambulatory Visit
Admission: RE | Admit: 2024-01-24 | Discharge: 2024-01-24 | Disposition: A | Discharge status: Home / Self Care | Source: Ambulatory Visit | Attending: Internal Medicine | Admitting: Internal Medicine

## 2024-01-24 DIAGNOSIS — Z1231 Encounter for screening mammogram for malignant neoplasm of breast: Secondary | ICD-10-CM | POA: Diagnosis present

## 2024-02-10 NOTE — Progress Notes (Unsigned)
 PROVIDER NOTE: Interpretation of information contained herein should be left to medically-trained personnel. Specific patient instructions are provided elsewhere under Patient Instructions section of medical record. This document was created in part using AI and STT-dictation technology, any transcriptional errors that may result from this process are unintentional.  Patient: Brandi [name]  Service: E/M   PCP: Rudolpho Norleen BIRCH, MD  DOB: 18-Sep-1935  DOS: 02/11/2024  Provider: Eric DELENA Como, MD  MRN: [id number]  Delivery: Face-to-face  Specialty: Interventional Pain Management  Type: Established Patient  Setting: Ambulatory outpatient facility  Specialty designation: 09  Referring Prov.: Rudolpho Norleen BIRCH, MD  Location: Outpatient office facility       History of present illness (HPI) Ms. Brandi [name], a 88 y.o. year old female, is here today because of her Chronic right-sided low back pain without sciatica [M54.50, G89.29]. Brandi [name] primary complain today is Back Pain (Lower, )  Pertinent problems: Brandi [name] has Headache disorder; Diabetic neuropathy (HCC); Abdominal pain; Sacroiliitis (HCC); Chronic pain syndrome; Abnormal CT scan, cervical spine (07/24/2021); Abnormal MRI, lumbar spine (05/09/2023); Chronic low back pain (1ry area of Pain) (Bilateral) (R>L) w/ sciatica (Right); Low back pain radiating to leg (Right); Pain of lateral upper thigh (Right) (Intermittent); Chronic sacroiliac joint pain (Right); Somatic dysfunction of sacroiliac joint (Right); Grade 1 Anterolisthesis of lumbosacral spine of L3/L4 and L5/S1; Lumbar facet joint pain; Chronic lower extremity pain (2ry area of Pain) (Right); Lumbosacral radiculopathy at L5 (Right); Lumbosacral radiculopathy at S1 (Right); DDD, lumbosacral w/ LBP & LEP; Lumbar facet hypertrophy (Multilevel) (Bilateral); Lumbar foraminal stenosis; Chronic low back pain (Right) w/o sciatica; Lumbar facet joint syndrome; Spondylosis without myelopathy or  radiculopathy, lumbosacral region; SIRS (systemic inflammatory response syndrome) (HCC); Abnormal NCS (nerve conduction studies) (03/07/2021); Polyneuropathy, peripheral sensorimotor axonal; Diabetic sensorimotor polyneuropathy (HCC); and Chronic painful diabetic neuropathy (HCC) on their pertinent problem list.  Pain Assessment: Severity of Chronic pain is reported as a 2 /10. Location: Back Lower/Down back of right leg, unsure at this time if it reaches foot and toes. Onset: More than a month ago. Quality: Nagging. Timing: Intermittent. Modifying factor(s): Heat, Medication. Vitals:  height is 5' 1 (1.549 m) and weight is 188 lb 2.9 oz (85.4 kg). Her temperature is 97.2 F (36.2 C) (abnormal). Her blood pressure is 192/67 (abnormal) and her pulse is 76. Her respiration is 18.  BMI: Estimated body mass index is 35.56 kg/m as calculated from the following:   Height as of this encounter: 5' 1 (1.549 m).   Weight as of this encounter: 188 lb 2.9 oz (85.4 kg).  Last encounter: 12/17/2023. Last procedure: 11/13/2023.  Reason for encounter: post-procedure evaluation and assessment.  The patient requested to come in today for evaluation of low back pain.  On 11/13/2023 we did a diagnostic, right sided, lumbar facet MBB (without steroids), and schedule her to return on 12/17/2023 to collect the diagnostic information from that procedure.  The patient did not keep the 12/17/2023 appointment and returns today 02/11/2024 for evaluation of her low back pain.  Unfortunately, the accuracy and validity of the results of the right sided lumbar facet block may have been compromised due to the patient's noncompliance with her follow-up appointment.  Review: The patient came in for an initial evaluation on 08/15/2023 referred by neurosurgery for a diagnostic sacroiliac joint block. Diagnostic right sacroiliac joint block x1 (performed on 08/21/2023) (Requested by referring neurosurgeon Dr. Deatrice Mullet Abd-El-Barr  ).  Procedure performed with 1 mg of midazolam  as an  anxiolytic, but no significant sedation.  On admission the patient indicated having a 3/10 pain level.  On discharge the patient indicated having a 3/10 level with no apparent immediate relief of the pain.  Follow-up performed on 09/04/2023 revealed 100% relief of the pain for the duration of the local anesthetic with 0% long-term benefit.  Procedure performed with steroids using Depo-Medrol  (methylprednisolone ) 80 mg.  Review and interpretation of results which suggest the pain to be originating from the injected area.  However pain does not seem to have an inflammatory component to it suggesting a mechanical etiology.  Diagnostic x-rays of the lumbar spine with bending views demonstrated osteopenia as well as lumbar DDD affecting the L4-5 and L5-S1 with a grade 1 retrolisthesis of L4 over L5 and facet joint degenerative changes from L3-4 through L5-S1.  Lumbar MRI performed on 05/09/2023 revealed a grade 1 anterolisthesis of (2 mm) L3 over L4, as well has a (3 mm) grade 1 anterolisthesis of L5 over S1.  In addition it also revealed multilevel, bilateral, lumbar facet arthropathy with bilateral L3-4 facet joint effusions.  Although the patient appears to have attained relief of the pain from sacroiliac joint injections in the past, we must remember that the posterior innervation of the sacroiliac joint is shared with the L5-S1 facet joints through the medial branch of the dorsal rami.  This means that it is clinically difficult to differentiate between lower lumbar facet joint pain and sacroiliac joint pain.  For this reason on 11/13/2023 we had done diagnostic, right sided, lumbar facet MBB, but unfortunately the patient did not show up to her follow-up appointment to collect the information from that diagnostic injection.   Discussed the use of AI scribe software for clinical note transcription with the patient, who gave verbal consent to proceed.  History  of Present Illness   Brandi [name] is an 88 year old female who presents with episodes of syncope following a lumbar facet block procedure.  She experienced syncope episodes after a diagnostic right-sided lumbar facet block on Nov 13, 2023, resulting in four hospital visits, with the last episode on Nov 14, 2023. Since stopping Ozempic, blood pressure medication, and Claritin , she has not had further syncope.  She cannot recall the results of the lumbar facet block due to subsequent hospitalizations. Initially, she felt the procedure was beneficial, but her memory is unclear. The procedure was performed without steroids.  She has ongoing lower back pain, now bilateral, with radiation down the right leg, typically to the posterior thigh, but not consistently to the foot. Pain medication relieves leg pain but not back pain.  Her medical history includes atrial fibrillation and flutter, with no current anticoagulation. She had open heart surgery in 2019 without complications and experiences significant water retention. No current syncope episodes since medication adjustments. No recollection of abnormal heart rhythms during recent hospitalizations.      Post-Procedure Evaluation   Type: Lumbar Facet, Medial Branch Block(s) (w/ fluoroscopic mapping) #1 (w/o STEROIDS) Laterality: Right  Level: L2, L3, L4, L5, and S1 Medial Branch Level(s). Injecting these levels blocks the L3-4, L4-5, and L5-S1 lumbar facet joints. Imaging: Fluoroscopic guidance Spinal ([id number]) Anesthesia: Local anesthesia (1-2% Lidocaine ) Anxiolysis: IV Versed  1.0 mg Sedation: No Sedation                       DOS: 11/13/2023 Performed by: Eric DELENA Como, MD  Primary Purpose: Diagnostic/Therapeutic Indications: Low back pain severe enough to impact quality of  life or function. 1. Chronic low back pain (Right) w/o sciatica   2. Lumbar facet joint pain   3. Lumbar facet hypertrophy (Multilevel) (Bilateral)   4. Lumbar  facet joint syndrome   5. Grade 1 Anterolisthesis of lumbosacral spine of L3/L4 and L5/S1   6. Spondylosis without myelopathy or radiculopathy, lumbosacral region   7. Low back pain radiating to leg (Right)    NAS-11 Pain score:   Pre-procedure: 3 /10   Post-procedure: 0-No pain/10     Effectiveness:  Initial hour after procedure:  (Undetermined). Subsequent 4-6 hours post-procedure:  (Undetermined). Analgesia past initial 6 hours:  (Undetermined). Note: The patient indicates not being able to recall the results of the diagnostic lumbar facet blocks secondary to unrelated problems with syncope where she ended up in the hospital for several days to determine the reason behind her syncope's.  It was determined that it was likely due to the combination of medications that she was taking at the time.  Pharmacotherapy Assessment   Opioid Analgesic: No chronic opioid analgesics therapy prescribed by our practice. None MME/day: 0 mg/day   Monitoring: [place] PMP: PDMP reviewed during this encounter.       Pharmacotherapy: No side-effects or adverse reactions reported. Compliance: No problems identified. Effectiveness: Clinically acceptable.  Brandi [name], [place]  02/11/2024  9:55 AM  Sign when Signing Visit Safety precautions to be maintained throughout the outpatient stay will include: orient to surroundings, keep bed in low position, maintain call bell within reach at all times, provide assistance with transfer out of bed and ambulation.     UDS:  No results found for: SUMMARY  No results found for: CBDTHCR No results found for: D8THCCBX No results found for: D9THCCBX  ROS  Constitutional: Denies any fever or chills Gastrointestinal: No reported hemesis, hematochezia, vomiting, or acute GI distress Musculoskeletal: Denies any acute onset joint swelling, redness, loss of ROM, or weakness Neurological: No reported episodes of acute onset apraxia, aphasia, dysarthria, agnosia,  amnesia, paralysis, loss of coordination, or loss of consciousness  Medication Review  Calcium  Carb-Cholecalciferol, Cyanocobalamin , Melatonin, PreserVision/Lutein , acetaminophen , aspirin  EC, atorvastatin , budesonide-formoterol , carboxymethylcellulose, carvedilol , ferrous sulfate , gabapentin , insulin  glargine, losartan , mirabegron  ER, nitroGLYCERIN , progesterone , and sodium chloride   History Review  Allergy: Ms. Brandi [name] is allergic to accupril [quinapril hcl], micardis [telmisartan], and quinapril. Drug: Brandi [name]  reports no history of drug use. Alcohol :  reports no history of alcohol  use. Tobacco:  reports that she has never smoked. She has never used smokeless tobacco. Social: Ms. Bashor  reports that she has never smoked. She has never used smokeless tobacco. She reports that she does not drink alcohol  and does not use drugs. Medical:  has a past medical history of Acquired cyst of kidney, Anemia, Anxiety, Aortic atherosclerosis (HCC), Arthritis, degenerative, Atrial fibrillation and flutter (HCC), Baden-Walker grade 3 cystocele, Chronic cystitis, Coronary artery disease (08/05/2010), Depression, Diabetic neuropathy (HCC), Dyspnea, GERD (gastroesophageal reflux disease), Headache, History of left atrial appendage closure (05/29/2018), HLD (hyperlipidemia), HTN (hypertension), Implantable loop recorder present (09/20/2016), Insomnia, Murmur, NSTEMI (non-ST elevated myocardial infarction) (HCC) (05/20/2018), NSVT (nonsustained ventricular tachycardia) (HCC) (01/2022), Obesity, OSA on CPAP, PONV (postoperative nausea and vomiting), Presence of drug coated stent in LAD coronary artery, PSVT (paroxysmal supraventricular tachycardia) (HCC) (01/2022), 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 Surgical: Ms. Jansma  has a past surgical history that includes  Cholecystectomy; Total knee arthroplasty (Bilateral);  Septoplasty; Bunionectomy; LOOP RECORDER INSERTION (N/A, 09/20/2016); Colonoscopy with propofol  (N/A, 06/14/2017); Breast biopsy (Right, 2004); LEFT HEART CATH AND CORONARY ANGIOGRAPHY (N/A, 05/22/2018); XI Robotic assisted inguinal hernia repair with mesh (Left, 10/14/2020); Coronary artery bypass graft (N/A, 05/29/2018); LEFT ATRIAL APPENDAGE OCCLUSION (Left, 05/29/2018); Coronary angioplasty with stent (Left, 08/05/2010); LEFT HEART CATH AND CORONARY ANGIOGRAPHY (Left, 07/03/2011); LEFT HEART CATH AND CORONARY ANGIOGRAPHY (Left, 05/22/2018); and Hysteroscopy with D & C (N/A, 12/14/2022). Family: family history includes Bladder Cancer in her maternal aunt; Breast cancer in her cousin; COPD in an other family member; Diabetes in an other family member; Hypercholesterolemia in an other family member; Hypertension in an other family member; Skin cancer in an other family member.  Laboratory Chemistry Profile   Renal Lab Results  Component Value Date   BUN 16 11/19/2023   CREATININE 0.84 11/19/2023   LABCREA 29 04/13/2020   GFRAA 54 (L) 05/24/2018   GFRNONAA >60 11/19/2023    Hepatic Lab Results  Component Value Date   AST 16 11/19/2023   ALT 22 11/19/2023   ALBUMIN 1.9 (L) 11/19/2023   ALKPHOS 86 11/19/2023   LIPASE 22 10/27/2023    Electrolytes Lab Results  Component Value Date   NA 135 11/19/2023   K 4.1 11/19/2023   CL 103 11/19/2023   CALCIUM  7.6 (L) 11/19/2023   MG 1.9 11/19/2023   PHOS 1.7 (L) 11/19/2023    Bone Lab Results  Component Value Date   25OHVITD1 68 08/15/2023   25OHVITD2 <1.0 08/15/2023   25OHVITD3 68 08/15/2023    Inflammation (CRP: Acute Phase) (ESR: Chronic Phase) Lab Results  Component Value Date   CRP 2 08/15/2023   ESRSEDRATE 2 08/15/2023   LATICACIDVEN 1.1 11/17/2023         Note: Above Lab results reviewed.  Recent Imaging Review  MM 3D SCREENING MAMMOGRAM BILATERAL BREAST CLINICAL DATA:   Screening.  EXAM: DIGITAL SCREENING BILATERAL MAMMOGRAM WITH TOMOSYNTHESIS AND CAD  TECHNIQUE: Bilateral screening digital craniocaudal and mediolateral oblique mammograms were obtained. Bilateral screening digital breast tomosynthesis was performed. The images were evaluated with computer-aided detection.  COMPARISON:  Previous exam(s).  ACR Breast Density Category b: There are scattered areas of fibroglandular density.  FINDINGS: There are no findings suspicious for malignancy.  IMPRESSION: No mammographic evidence of malignancy. A result letter of this screening mammogram will be mailed directly to the patient.  RECOMMENDATION: Screening mammogram in one year. (Code:[id number]-B-01Y)  BI-RADS CATEGORY  1: Negative.  Electronically Signed   By: Alm Parkins M.D.   On: 01/28/2024 09:12 Note: Reviewed        Physical Exam  Vitals: BP (!) 192/67   Pulse 76   Temp (!) 97.2 F (36.2 C)   Resp 18   Ht 5' 1 (1.549 m)   Wt 188 lb 2.9 oz (85.4 kg)   BMI 35.56 kg/m  BMI: Estimated body mass index is 35.56 kg/m as calculated from the following:   Height as of this encounter: 5' 1 (1.549 m).   Weight as of this encounter: 188 lb 2.9 oz (85.4 kg). Ideal: Ideal body weight: 47.8 kg (105 lb 6.1 oz) Adjusted ideal body weight: 62.8 kg (138 lb 8 oz) General appearance: Well nourished, well developed, and well hydrated. In no apparent acute distress Mental status: Alert, oriented x 3 (person, place, & time)       Respiratory: No evidence of acute respiratory distress Eyes: PERLA   Assessment   Diagnosis Status  1. Chronic low back pain (  Right) w/o sciatica   2. Grade 1 Anterolisthesis of lumbosacral spine of L3/L4 and L5/S1   3. Lumbar facet hypertrophy (Multilevel) (Bilateral)   4. Lumbar facet joint pain   5. Lumbar facet joint syndrome   6. Low back pain radiating to leg (Right)   7. Pain of lateral upper thigh (Right) (Intermittent)   8. Abnormal MRI, lumbar spine  (05/09/2023)   9. Chronic lower extremity pain (2ry area of Pain) (Right)   10. Lumbosacral radiculopathy at L5 (Right)   11. Lumbosacral radiculopathy at S1 (Right)   12. Abnormal NCS (nerve conduction studies) (03/07/2021)   13. Polyneuropathy, peripheral sensorimotor axonal   14. Diabetic sensorimotor polyneuropathy (HCC)   15. Chronic painful diabetic neuropathy (HCC)    Controlled Controlled Controlled   Updated Problems: Problem  Abnormal NCS (nerve conduction studies) (03/07/2021)   Diagnostic (L)LE NCT (EMG/PNCV) (03/07/2021) by Arthea Farrow, MD ([facility]) Dx: Electrodiagnostic evidence of chronic severe sensorimotor polyneuropathy.   Polyneuropathy, Peripheral Sensorimotor Axonal  Diabetic Sensorimotor Polyneuropathy (Hcc)   Diagnostic (L)LE NCT (EMG/PNCV) (03/07/2021) by Arthea Farrow, MD ([facility]) Dx: Electrodiagnostic evidence of chronic severe sensorimotor polyneuropathy.   Chronic Painful Diabetic Neuropathy (Hcc)   Diagnostic (L)LE NCT (EMG/PNCV) (03/07/2021) by Arthea Farrow, MD ([facility]) Dx: Electrodiagnostic evidence of chronic severe sensorimotor polyneuropathy.     Plan of Care  Problem-specific:  Assessment and Plan    Chronic low back pain with right leg referred pain   Chronic low back pain with referred pain to the right leg likely originates from lumbar facet joints. A previous diagnostic right-sided lumbar facet block without steroids was inconclusive due to subsequent syncopal episodes and hospitalization. Pain persists across the lower back and down the right leg, suggesting referred pain from lumbar facet joints. No long-term benefit is expected from the initial injection due to the absence of steroids. Radiofrequency ablation is considered for longer-term relief if facet joints are confirmed as the pain source. Order a repeat lumbar facet block without steroids to assess pain relief during the initial 6-8 hours  post-procedure. Evaluate pain relief during the numbness period to determine if lumbar facet joints are the pain source. Consider radiofrequency ablation if facet joints are confirmed as the pain source.  Atrial fibrillation and atrial flutter (status unclear, not on anticoagulation)   Atrial fibrillation and atrial flutter status is unclear, and she is not currently on anticoagulation. There is a potential risk of thromboembolic events if atrial fibrillation is active. No recent abnormal heart rhythms were reported during hospitalization for syncopal episodes. The importance of anticoagulation if atrial fibrillation is active to prevent thromboembolic events such as stroke or pulmonary embolism was discussed. Advise consultation with her primary care physician to assess the current status of atrial fibrillation and the need for anticoagulation.       Brandi [name] has a current medication list which includes the following long-term medication(s): budesonide-formoterol , carvedilol , gabapentin , lantus  solostar, losartan , mirabegron  er, progesterone , and sodium chloride .  Pharmacotherapy (Medications Ordered): No orders of the defined types were placed in this encounter.  Orders:  Orders Placed This Encounter  Procedures   LUMBAR FACET(MEDIAL BRANCH NERVE BLOCK) MBNB    Diagnosis: Lumbar Facet Syndrome (M47.816); Lumbosacral Facet Syndrome (M47.817); Lumbar Facet Joint Pain (M54.59) Medical Necessity Statement: 1.Severe chronic axial low back pain causing functional impairment documented by ongoing pain scale assessments. 2.Pain present for longer than 3 months (Chronic) documented to have failed noninvasive conservative therapies. 3.Absence of untreated radiculopathy. 4.There is no  radiological evidence of untreated fractures, tumor, infection, or deformity.  Physical Examination Findings: Positive Kemp Maneuver: (Y)  Positive Lumbar Hyperextension-Rotation provocative test: (Y)     Standing Status:   Future    Expiration Date:   05/13/2024    Scheduling Instructions:     Procedure: Lumbar facet Block (without steroids)     Type: Medial Branch Block     Side: Bilateral     Purpose: Diagnostic Radiologic Mapping     Level(s): L3-4, L4-5, and L5-S1 Facets (L2, L3, L4, L5, and S1 Medial Branch)     Sedation: Patient's choice.     Timeframe: ASAP    Where will this procedure be performed?:   [facility]     Interventional Therapies  Risk Factors  Considerations  Medical Comorbidities:  T2IDDM  MO (BMI>35)  CAD s/p CABG x 3  Hx A-Fib/Flutter  Hx. Orthostatic Hypotension  Stage 3 CKD  GERD  HTN  Cystocele Grade 3  Incontinence     Planned  Pending:      Under consideration:   Diagnostic right sacroiliac joint BLK #2  Diagnostic right lumbar facet MBB #2    Completed:   Diagnostic right SI joint Blk x1 (08/21/2023) (100/100/0/0) (Requested by referring NS: Dr. CHRISTELLA. CHRISTELLA. Abd-El-Barr ) Diagnostic/therapeutic right L4-5 LESI x1 (10/16/2023) (0/100/25 x 2 weeks/0)  Diagnostic right lumbar facet (L2-S1) MBB x1 (11/13/2023) (   Therapeutic  Palliative (PRN) options:   Diagnostic (L)LE NCT (EMG/PNCV) (03/07/2021) by Arthea Farrow, MD ([facility]) Dx: Electrodiagnostic evidence of chronic severe sensorimotor polyneuropathy.   Completed by other providers:   Therapeutic right L4-5 TFESI x3 (05/24/2017, 06/29/2017, 08/30/2017) by Morene Falcon, DO [facility])  Therapeutic right SI joint inj. x4 (09/28/2016, 02/20/2017, 12/04/2017, 01/24/2018) by Morene Falcon, DO [facility])  Diagnostic left IA hip joint inj. x1 (06/10/2015) by Morene Falcon, DO ([facility])       Return for [facility]): (B) L-FCT Blk #1 (w/o Steroids).    Recent Visits Date Type Provider Dept  11/13/23 Procedure visit Tanya Glisson, MD [facility]  Showing recent visits within past 90 days and meeting all other requirements Today's Visits Date Type Provider Dept   02/11/24 Office Visit Tanya Glisson, MD [facility]  Showing today's visits and meeting all other requirements Future Appointments No visits were found meeting these conditions. Showing future appointments within next 90 days and meeting all other requirements  I discussed the assessment and treatment plan with the patient. The patient was provided an opportunity to ask questions and all were answered. The patient agreed with the plan and demonstrated an understanding of the instructions.  Patient advised to call back or seek an in-person evaluation if the symptoms or condition worsens.  Duration of encounter: 60 minutes.  Total time on encounter, as per AMA guidelines included both the face-to-face and non-face-to-face time personally spent by the physician and/or other qualified health care professional(s) on the day of the encounter (includes time in activities that require the physician or other qualified health care professional and does not include time in activities normally performed by clinical staff). Physician's time may include the following activities when performed: Preparing to see the patient (e.g., pre-charting review of records, searching for previously ordered imaging, lab work, and nerve conduction tests) Review of prior analgesic pharmacotherapies. Reviewing PMP Interpreting ordered tests (e.g., lab work, imaging, nerve conduction tests) Performing post-procedure evaluations, including interpretation of diagnostic procedures Obtaining and/or reviewing separately obtained history Performing a medically  appropriate examination and/or evaluation Counseling and educating the patient/family/caregiver Ordering medications, tests, or procedures Referring and communicating with other health care professionals (when not separately reported) Documenting clinical information in the electronic or other health record Independently interpreting results (not separately  reported) and communicating results to the patient/ family/caregiver Care coordination (not separately reported)  Note by: Eric DELENA Como, MD (TTS and AI technology used. I apologize for any typographical errors that were not detected and corrected.) Date: 02/11/2024; Time: 12:55 PM

## 2024-02-11 ENCOUNTER — Encounter: Payer: Self-pay | Admitting: Pain Medicine

## 2024-02-11 ENCOUNTER — Ambulatory Visit: Attending: Pain Medicine | Admitting: Pain Medicine

## 2024-02-11 VITALS — BP 192/67 | HR 76 | Temp 97.2°F | Resp 18 | Ht 61.0 in | Wt 188.2 lb

## 2024-02-11 DIAGNOSIS — M47816 Spondylosis without myelopathy or radiculopathy, lumbar region: Secondary | ICD-10-CM | POA: Insufficient documentation

## 2024-02-11 DIAGNOSIS — M4317 Spondylolisthesis, lumbosacral region: Secondary | ICD-10-CM | POA: Diagnosis present

## 2024-02-11 DIAGNOSIS — G8929 Other chronic pain: Secondary | ICD-10-CM | POA: Insufficient documentation

## 2024-02-11 DIAGNOSIS — M545 Low back pain, unspecified: Secondary | ICD-10-CM | POA: Insufficient documentation

## 2024-02-11 DIAGNOSIS — E1142 Type 2 diabetes mellitus with diabetic polyneuropathy: Secondary | ICD-10-CM | POA: Diagnosis present

## 2024-02-11 DIAGNOSIS — M5417 Radiculopathy, lumbosacral region: Secondary | ICD-10-CM | POA: Insufficient documentation

## 2024-02-11 DIAGNOSIS — M79651 Pain in right thigh: Secondary | ICD-10-CM | POA: Diagnosis present

## 2024-02-11 DIAGNOSIS — R937 Abnormal findings on diagnostic imaging of other parts of musculoskeletal system: Secondary | ICD-10-CM | POA: Diagnosis present

## 2024-02-11 DIAGNOSIS — R94130 Abnormal response to nerve stimulation, unspecified: Secondary | ICD-10-CM | POA: Diagnosis present

## 2024-02-11 DIAGNOSIS — M79604 Pain in right leg: Secondary | ICD-10-CM | POA: Insufficient documentation

## 2024-02-11 DIAGNOSIS — G608 Other hereditary and idiopathic neuropathies: Secondary | ICD-10-CM | POA: Diagnosis present

## 2024-02-11 DIAGNOSIS — M5459 Other low back pain: Secondary | ICD-10-CM | POA: Insufficient documentation

## 2024-02-11 DIAGNOSIS — E1140 Type 2 diabetes mellitus with diabetic neuropathy, unspecified: Secondary | ICD-10-CM | POA: Diagnosis present

## 2024-02-11 DIAGNOSIS — Z794 Long term (current) use of insulin: Secondary | ICD-10-CM

## 2024-02-11 NOTE — Progress Notes (Signed)
 Safety precautions to be maintained throughout the outpatient stay will include: orient to surroundings, keep bed in low position, maintain call bell within reach at all times, provide assistance with transfer out of bed and ambulation.

## 2024-02-11 NOTE — Patient Instructions (Signed)

## 2024-02-18 ENCOUNTER — Other Ambulatory Visit: Payer: Self-pay

## 2024-02-18 ENCOUNTER — Emergency Department
Admission: EM | Admit: 2024-02-18 | Discharge: 2024-02-18 | Disposition: A | Discharge status: Home / Self Care | Attending: Emergency Medicine | Admitting: Emergency Medicine

## 2024-02-18 ENCOUNTER — Emergency Department

## 2024-02-18 DIAGNOSIS — I10 Essential (primary) hypertension: Secondary | ICD-10-CM | POA: Diagnosis not present

## 2024-02-18 DIAGNOSIS — E119 Type 2 diabetes mellitus without complications: Secondary | ICD-10-CM | POA: Insufficient documentation

## 2024-02-18 DIAGNOSIS — Y92009 Unspecified place in unspecified non-institutional (private) residence as the place of occurrence of the external cause: Secondary | ICD-10-CM

## 2024-02-18 DIAGNOSIS — S0990XA Unspecified injury of head, initial encounter: Secondary | ICD-10-CM | POA: Insufficient documentation

## 2024-02-18 DIAGNOSIS — W010XXA Fall on same level from slipping, tripping and stumbling without subsequent striking against object, initial encounter: Secondary | ICD-10-CM | POA: Insufficient documentation

## 2024-02-18 NOTE — ED Triage Notes (Signed)
 Pt sts that she was at home getting food out of the fridge and fell. Pt sts that she did not have any LOC during the event. Pt sts that she did hit her head. Pt denies any blood thinners at this time.

## 2024-02-18 NOTE — Discharge Instructions (Signed)
 Your exam and CT scans are normal or reassuring following your fall.  No signs of a serious injury related to your incident.  You have a small scalp hematoma which should resolve without difficulty.  Take your home meds as prescribed.  Follow-up with primary provider as needed.

## 2024-02-18 NOTE — ED Notes (Signed)
 See triage note  Presents s/p fall   states she was getting something out of the fridge  Spilled it and thinks she slipped in it  Hitting her head  Having to right side of head /neck  No LOC

## 2024-02-18 NOTE — ED Provider Notes (Signed)
 [facility] Emergency Department Provider Note     Event Date/Time   First MD Initiated Contact with Patient 02/18/24 1612     (approximate)   History   Fall   HPI  Brandi [name] is a 88 y.o. female with a history of HTN, DM type II, obesity, depression, GERD, and A-fib not on Eliquis, presents to the ED following a mechanical fall.  Patient presents via personal vehicle company by family member, reporting that she slipped and fell this morning getting food out of the fridge.  She reports a minor head injury but denies any LOC, nausea, vomiting, weakness.  No other injury reported at this time.  Physical Exam   Triage Vital Signs: ED Triage Vitals  Encounter Vitals Group     BP 02/18/24 1509 (S) (!) 256/94     Girls Systolic BP Percentile --      Girls Diastolic BP Percentile --      Boys Systolic BP Percentile --      Boys Diastolic BP Percentile --      Pulse Rate 02/18/24 1509 78     Resp 02/18/24 1509 17     Temp 02/18/24 1509 98 F (36.7 C)     Temp Source 02/18/24 1509 Oral     SpO2 02/18/24 1509 98 %     Weight 02/18/24 1507 186 lb (84.4 kg)     Height 02/18/24 1507 5' 1 (1.549 m)     Head Circumference --      Peak Flow --      Pain Score 02/18/24 1506 4     Pain Loc --      Pain Education --      Exclude from Growth Chart --     Most recent vital signs: Vitals:   02/18/24 1509 02/18/24 1612  BP: (S) (!) 256/94 (!) 199/90  Pulse: 78 70  Resp: 17 18  Temp: 98 F (36.7 C)   SpO2: 98% 98%    General Awake, no distress. NAD HEENT NCAT, except for some soft tissue swelling noted to the right parietal scalp.  No abrasion, laceration, or active bleeding noted. PERRL. EOMI. No rhinorrhea. Mucous membranes are moist. CV:  Good peripheral perfusion. RRR RESP:  Normal effort. CTA ABD:  No distention.  MSK:  AROM of all extremities NEURO: CN 2-12 grossly intact.   ED Results / Procedures / Treatments   Labs (all labs ordered  are listed, but only abnormal results are displayed) Labs Reviewed - No data to display   EKG   RADIOLOGY  I personally viewed and evaluated these images as part of my medical decision making, as well as reviewing the written report by the radiologist.  ED Provider Interpretation: No acute intracranial or cervical spine findings  CT Cervical Spine Wo Contrast Result Date: 02/18/2024 CLINICAL DATA:  Fall, hit head. EXAM: CT CERVICAL SPINE WITHOUT CONTRAST TECHNIQUE: Multidetector CT imaging of the cervical spine was performed without intravenous contrast. Multiplanar CT image reconstructions were also generated. RADIATION DOSE REDUCTION: This exam was performed according to the departmental dose-optimization program which includes automated exposure control, adjustment of the mA and/or kV according to patient size and/or use of iterative reconstruction technique. COMPARISON:  07/24/2021 FINDINGS: Alignment: Chronic grade 1 anterolisthesis of C5 on C6. No traumatic subluxation. Skull base and vertebrae: No acute fracture. Vertebral body heights are maintained. The dens and skull base are intact. Moderate pannus is C1-C2. Soft tissues and spinal canal: No  prevertebral fluid or swelling. No visible canal hematoma. Disc levels: Moderate diffuse facet hypertrophy with mild multilevel degenerative disc disease. No significant change from prior exam. Upper chest: No acute findings. Other: Carotid calcifications. IMPRESSION: 1. No acute fracture or traumatic subluxation of the cervical spine. 2. Multilevel degenerative disc disease and facet hypertrophy. Electronically Signed   By: Andrea Gasman M.D.   On: 02/18/2024 16:21   CT Head Wo Contrast Result Date: 02/18/2024 CLINICAL DATA:  Status post fall, struck head. EXAM: CT HEAD WITHOUT CONTRAST TECHNIQUE: Contiguous axial images were obtained from the base of the skull through the vertex without intravenous contrast. RADIATION DOSE REDUCTION: This exam was  performed according to the departmental dose-optimization program which includes automated exposure control, adjustment of the mA and/or kV according to patient size and/or use of iterative reconstruction technique. COMPARISON:  05/10/2023 FINDINGS: Brain: No intracranial hemorrhage, mass effect, or midline shift. Stable atrophy. No hydrocephalus. The basilar cisterns are patent. Periventricular and deep white matter hypodensity typical of chronic small vessel ischemia. No evidence of territorial infarct or acute ischemia. Enlarged partially empty sella, chronic. No extra-axial or intracranial fluid collection. Vascular: Atherosclerosis of skullbase vasculature without hyperdense vessel or abnormal calcification. Skull: No fracture or focal lesion. Sinuses/Orbits: Chronic mucosal thickening throughout the paranasal sinuses with frothy debris in the right side of sphenoid sinus. No mastoid effusion Other: Right parietal scalp hematoma. IMPRESSION: 1. Right parietal scalp hematoma. No acute intracranial abnormality. No skull fracture. 2. Stable atrophy and chronic small vessel ischemia. 3. Chronic paranasal sinus disease. Electronically Signed   By: Andrea Gasman M.D.   On: 02/18/2024 16:16    PROCEDURES:  Critical Care performed: No  Procedures   MEDICATIONS ORDERED IN ED: Medications - No data to display   IMPRESSION / MDM / ASSESSMENT AND PLAN / ED COURSE  I reviewed the triage vital signs and the nursing notes.                              Differential diagnosis includes, but is not limited to, closed head injury, concussion, SDH, skull fracture, cervical fracture, cervical radiculopathy  Patient's presentation is most consistent with acute complicated illness / injury requiring diagnostic workup.  Patient's diagnosis is consistent with mild scalp contusion following mechanical fall.  Geriatric patient with reassuring exam and workup at this time.  She endorses a minor head injury without  LOC.  CT images interpreted by me, showed no acute intracranial process, and no acute cervical findings.  Patient's exam is reassuring with no neuro muscle deficits appreciated.  Patient will be discharged home with instructions to take her home meds. Patient is to follow up with her primary provider as discussed, as needed or otherwise directed. Patient is given ED precautions to return to the ED for any worsening or new symptoms.   FINAL CLINICAL IMPRESSION(S) / ED DIAGNOSES   Final diagnoses:  Fall in home, initial encounter  Minor head injury, initial encounter     Rx / DC Orders   ED Discharge Orders     None        Note:  This document was prepared using Dragon voice recognition software and may include unintentional dictation errors.    Loyd Candida LULLA Aldona, PA-C 02/18/24 1845    Willo Dunnings, MD 02/18/24 2259

## 2024-02-26 ENCOUNTER — Telehealth: Payer: Self-pay | Admitting: Pain Medicine

## 2024-02-26 NOTE — Telephone Encounter (Signed)
 Patient had a fall and wants to speak with someone about this. She also has questions about her appt for injections. She is diabetic

## 2024-02-26 NOTE — Telephone Encounter (Signed)
 Spoke with patient and she was trying to figure out whether or not to come in for procedure.  Was concerned about waiting until 1120 to have [procedure when she is diabetic.  Appointment moved to 0800.

## 2024-03-04 ENCOUNTER — Ambulatory Visit: Admitting: Pain Medicine

## 2024-03-25 ENCOUNTER — Other Ambulatory Visit: Payer: Self-pay

## 2024-03-25 ENCOUNTER — Emergency Department

## 2024-03-25 ENCOUNTER — Ambulatory Visit: Admitting: Pain Medicine

## 2024-03-25 ENCOUNTER — Observation Stay
Admission: EM | Admit: 2024-03-25 | Discharge: 2024-03-28 | Disposition: A | Discharge status: Home Health | Attending: Internal Medicine | Admitting: Internal Medicine

## 2024-03-25 DIAGNOSIS — Z96653 Presence of artificial knee joint, bilateral: Secondary | ICD-10-CM | POA: Insufficient documentation

## 2024-03-25 DIAGNOSIS — E1140 Type 2 diabetes mellitus with diabetic neuropathy, unspecified: Secondary | ICD-10-CM | POA: Insufficient documentation

## 2024-03-25 DIAGNOSIS — I471 Supraventricular tachycardia, unspecified: Secondary | ICD-10-CM | POA: Diagnosis present

## 2024-03-25 DIAGNOSIS — I4892 Unspecified atrial flutter: Secondary | ICD-10-CM

## 2024-03-25 DIAGNOSIS — Z955 Presence of coronary angioplasty implant and graft: Secondary | ICD-10-CM | POA: Insufficient documentation

## 2024-03-25 DIAGNOSIS — Z794 Long term (current) use of insulin: Secondary | ICD-10-CM | POA: Diagnosis not present

## 2024-03-25 DIAGNOSIS — I959 Hypotension, unspecified: Secondary | ICD-10-CM | POA: Diagnosis not present

## 2024-03-25 DIAGNOSIS — E669 Obesity, unspecified: Secondary | ICD-10-CM | POA: Diagnosis not present

## 2024-03-25 DIAGNOSIS — Z7984 Long term (current) use of oral hypoglycemic drugs: Secondary | ICD-10-CM | POA: Insufficient documentation

## 2024-03-25 DIAGNOSIS — I491 Atrial premature depolarization: Secondary | ICD-10-CM | POA: Insufficient documentation

## 2024-03-25 DIAGNOSIS — Z7982 Long term (current) use of aspirin: Secondary | ICD-10-CM | POA: Insufficient documentation

## 2024-03-25 DIAGNOSIS — I160 Hypertensive urgency: Secondary | ICD-10-CM | POA: Insufficient documentation

## 2024-03-25 DIAGNOSIS — I480 Paroxysmal atrial fibrillation: Secondary | ICD-10-CM | POA: Diagnosis not present

## 2024-03-25 DIAGNOSIS — Z951 Presence of aortocoronary bypass graft: Secondary | ICD-10-CM | POA: Diagnosis not present

## 2024-03-25 DIAGNOSIS — R531 Weakness: Secondary | ICD-10-CM

## 2024-03-25 DIAGNOSIS — I4891 Unspecified atrial fibrillation: Secondary | ICD-10-CM | POA: Diagnosis not present

## 2024-03-25 DIAGNOSIS — I161 Hypertensive emergency: Secondary | ICD-10-CM

## 2024-03-25 DIAGNOSIS — I2510 Atherosclerotic heart disease of native coronary artery without angina pectoris: Secondary | ICD-10-CM | POA: Insufficient documentation

## 2024-03-25 DIAGNOSIS — Z79899 Other long term (current) drug therapy: Secondary | ICD-10-CM | POA: Insufficient documentation

## 2024-03-25 DIAGNOSIS — I4729 Other ventricular tachycardia: Secondary | ICD-10-CM | POA: Diagnosis present

## 2024-03-25 DIAGNOSIS — E1142 Type 2 diabetes mellitus with diabetic polyneuropathy: Secondary | ICD-10-CM | POA: Diagnosis present

## 2024-03-25 DIAGNOSIS — R55 Syncope and collapse: Secondary | ICD-10-CM | POA: Diagnosis present

## 2024-03-25 DIAGNOSIS — R519 Headache, unspecified: Secondary | ICD-10-CM | POA: Diagnosis present

## 2024-03-25 DIAGNOSIS — G4733 Obstructive sleep apnea (adult) (pediatric): Secondary | ICD-10-CM

## 2024-03-25 DIAGNOSIS — E785 Hyperlipidemia, unspecified: Secondary | ICD-10-CM | POA: Diagnosis not present

## 2024-03-25 DIAGNOSIS — E876 Hypokalemia: Secondary | ICD-10-CM | POA: Insufficient documentation

## 2024-03-25 DIAGNOSIS — I951 Orthostatic hypotension: Secondary | ICD-10-CM | POA: Diagnosis present

## 2024-03-25 LAB — CBC
HCT: 38.9 % (ref 36.0–46.0)
Hemoglobin: 12.6 g/dL (ref 12.0–15.0)
MCH: 28.2 pg (ref 26.0–34.0)
MCHC: 32.4 g/dL (ref 30.0–36.0)
MCV: 87 fL (ref 80.0–100.0)
Platelets: 179 K/uL (ref 150–400)
RBC: 4.47 MIL/uL (ref 3.87–5.11)
RDW: 14.6 % (ref 11.5–15.5)
WBC: 6.9 K/uL (ref 4.0–10.5)
nRBC: 0 % (ref 0.0–0.2)

## 2024-03-25 LAB — BASIC METABOLIC PANEL WITH GFR
Anion gap: 11 (ref 5–15)
BUN: 12 mg/dL (ref 8–23)
CO2: 27 mmol/L (ref 22–32)
Calcium: 8.3 mg/dL — ABNORMAL LOW (ref 8.9–10.3)
Chloride: 98 mmol/L (ref 98–111)
Creatinine, Ser: 0.87 mg/dL (ref 0.44–1.00)
GFR, Estimated: >60 mL/min (ref >60)
Glucose, Bld: 269 mg/dL — ABNORMAL HIGH (ref 70–99)
Potassium: 3.8 mmol/L (ref 3.5–5.1)
Sodium: 136 mmol/L (ref 135–145)

## 2024-03-25 LAB — TROPONIN I (HIGH SENSITIVITY)
Troponin I (High Sensitivity): 7 ng/L (ref <18)
Troponin I (High Sensitivity): 8 ng/L (ref <18)

## 2024-03-25 LAB — CBG MONITORING, ED: Glucose-Capillary: 218 mg/dL — ABNORMAL HIGH (ref 70–99)

## 2024-03-25 MED ORDER — LABETALOL HCL 5 MG/ML IV SOLN
10.0000 mg | Freq: Once | INTRAVENOUS | Status: AC
  Administered 2024-03-25: 10 mg via INTRAVENOUS
  Filled 2024-03-25: qty 4

## 2024-03-25 MED ORDER — VITAMIN B-12 100 MCG PO TABS
100.0000 ug | ORAL_TABLET | Freq: Every day | ORAL | Status: DC
  Administered 2024-03-26 – 2024-03-28 (×3): 100 ug via ORAL
  Filled 2024-03-25 (×3): qty 1

## 2024-03-25 MED ORDER — CLONIDINE HCL 0.1 MG PO TABS: 0.1000 mg | ORAL_TABLET | Freq: Once | ORAL | Status: DC

## 2024-03-25 MED ORDER — MIRABEGRON ER 50 MG PO TB24
50.0000 mg | ORAL_TABLET | Freq: Every day | ORAL | Status: DC
  Administered 2024-03-26 – 2024-03-28 (×3): 50 mg via ORAL
  Filled 2024-03-25 (×3): qty 1

## 2024-03-25 MED ORDER — ATORVASTATIN CALCIUM 20 MG PO TABS
40.0000 mg | ORAL_TABLET | Freq: Every evening | ORAL | Status: DC
  Administered 2024-03-26 – 2024-03-27 (×3): 40 mg via ORAL
  Filled 2024-03-25 (×3): qty 2

## 2024-03-25 MED ORDER — SODIUM CHLORIDE 0.9 % IV BOLUS
500.0000 mL | Freq: Once | INTRAVENOUS | Status: AC
  Administered 2024-03-25: 500 mL via INTRAVENOUS

## 2024-03-25 MED ORDER — HYDRALAZINE HCL 50 MG PO TABS
50.0000 mg | ORAL_TABLET | Freq: Three times a day (TID) | ORAL | Status: DC
  Administered 2024-03-26 (×2): 50 mg via ORAL
  Filled 2024-03-25 (×2): qty 1

## 2024-03-25 MED ORDER — ACETAMINOPHEN 325 MG PO TABS
650.0000 mg | ORAL_TABLET | Freq: Four times a day (QID) | ORAL | Status: DC | PRN
  Administered 2024-03-27: 650 mg via ORAL
  Filled 2024-03-25: qty 2

## 2024-03-25 MED ORDER — INSULIN ASPART 100 UNIT/ML IJ SOLN
0.0000 [IU] | Freq: Three times a day (TID) | INTRAMUSCULAR | Status: DC
  Administered 2024-03-26: 3 [IU] via SUBCUTANEOUS
  Administered 2024-03-26 – 2024-03-27 (×5): 2 [IU] via SUBCUTANEOUS
  Filled 2024-03-25 (×6): qty 1

## 2024-03-25 MED ORDER — FERROUS SULFATE 325 (65 FE) MG PO TABS
325.0000 mg | ORAL_TABLET | Freq: Every day | ORAL | Status: DC
  Administered 2024-03-26 – 2024-03-28 (×3): 325 mg via ORAL
  Filled 2024-03-25 (×3): qty 1

## 2024-03-25 MED ORDER — ONDANSETRON HCL 4 MG/2ML IJ SOLN: 4.0000 mg | Freq: Three times a day (TID) | INTRAMUSCULAR | Status: DC | PRN

## 2024-03-25 MED ORDER — SALINE SPRAY 0.65 % NA SOLN: 2.0000 | NASAL | Status: DC | PRN

## 2024-03-25 MED ORDER — NITROGLYCERIN 0.4 MG SL SUBL
0.4000 mg | SUBLINGUAL_TABLET | SUBLINGUAL | Status: DC | PRN
Start: 2024-03-25 — End: 2024-03-28

## 2024-03-25 MED ORDER — MELATONIN 5 MG PO TABS
10.0000 mg | ORAL_TABLET | Freq: Every day | ORAL | Status: DC
  Administered 2024-03-26 – 2024-03-27 (×3): 10 mg via ORAL
  Filled 2024-03-25 (×3): qty 2

## 2024-03-25 MED ORDER — KETOROLAC TROMETHAMINE 15 MG/ML IJ SOLN
15.0000 mg | Freq: Once | INTRAMUSCULAR | Status: AC
  Administered 2024-03-25: 15 mg via INTRAVENOUS
  Filled 2024-03-25: qty 1

## 2024-03-25 MED ORDER — ENOXAPARIN SODIUM 40 MG/0.4ML IJ SOSY
40.0000 mg | PREFILLED_SYRINGE | Freq: Every day | INTRAMUSCULAR | Status: DC
  Administered 2024-03-25 – 2024-03-27 (×3): 40 mg via SUBCUTANEOUS
  Filled 2024-03-25 (×3): qty 0.4

## 2024-03-25 MED ORDER — PROSIGHT PO TABS
1.0000 | ORAL_TABLET | Freq: Two times a day (BID) | ORAL | Status: DC
  Administered 2024-03-26 – 2024-03-28 (×6): 1 via ORAL
  Filled 2024-03-25 (×6): qty 1

## 2024-03-25 MED ORDER — LOSARTAN POTASSIUM 50 MG PO TABS
100.0000 mg | ORAL_TABLET | Freq: Every day | ORAL | Status: DC
  Administered 2024-03-26 – 2024-03-27 (×2): 100 mg via ORAL
  Filled 2024-03-25 (×2): qty 2

## 2024-03-25 MED ORDER — POLYVINYL ALCOHOL 1.4 % OP SOLN
1.0000 [drp] | Freq: Every day | OPHTHALMIC | Status: DC
Start: 2024-03-25 — End: 2024-03-28
  Administered 2024-03-26: 1 [drp] via OPHTHALMIC
  Administered 2024-03-27: 2 [drp] via OPHTHALMIC
  Filled 2024-03-25 (×2): qty 15

## 2024-03-25 MED ORDER — PROGESTERONE MICRONIZED 100 MG PO CAPS
100.0000 mg | ORAL_CAPSULE | Freq: Every day | ORAL | Status: DC
  Administered 2024-03-26 (×2): 100 mg via ORAL
  Filled 2024-03-25 (×3): qty 1

## 2024-03-25 MED ORDER — INSULIN GLARGINE 100 UNIT/ML ~~LOC~~ SOLN
5.0000 [IU] | Freq: Every day | SUBCUTANEOUS | Status: DC
  Administered 2024-03-26 – 2024-03-28 (×3): 5 [IU] via SUBCUTANEOUS
  Filled 2024-03-25 (×3): qty 0.05

## 2024-03-25 MED ORDER — ACETAMINOPHEN 325 MG PO TABS
650.0000 mg | ORAL_TABLET | Freq: Once | ORAL | Status: AC
  Administered 2024-03-25: 650 mg via ORAL
  Filled 2024-03-25: qty 2

## 2024-03-25 MED ORDER — GABAPENTIN 400 MG PO CAPS
400.0000 mg | ORAL_CAPSULE | Freq: Three times a day (TID) | ORAL | Status: DC
  Administered 2024-03-26 – 2024-03-28 (×8): 400 mg via ORAL
  Filled 2024-03-25 (×8): qty 1

## 2024-03-25 MED ORDER — ASPIRIN 81 MG PO TBEC
81.0000 mg | DELAYED_RELEASE_TABLET | Freq: Every day | ORAL | Status: DC
  Administered 2024-03-26 – 2024-03-28 (×3): 81 mg via ORAL
  Filled 2024-03-25 (×3): qty 1

## 2024-03-25 MED ORDER — FLUTICASONE FUROATE-VILANTEROL 100-25 MCG/ACT IN AEPB
1.0000 | INHALATION_SPRAY | Freq: Every day | RESPIRATORY_TRACT | Status: DC
  Administered 2024-03-26 – 2024-03-28 (×3): 1 via RESPIRATORY_TRACT
  Filled 2024-03-25: qty 28

## 2024-03-25 MED ORDER — INSULIN ASPART 100 UNIT/ML IJ SOLN
0.0000 [IU] | Freq: Every day | INTRAMUSCULAR | Status: DC
  Administered 2024-03-25: 2 [IU] via SUBCUTANEOUS
  Filled 2024-03-25: qty 2
  Filled 2024-03-25: qty 1

## 2024-03-25 MED ORDER — NICARDIPINE HCL IN NACL 20-0.86 MG/200ML-% IV SOLN
3.0000 mg/h | INTRAVENOUS | Status: DC
  Administered 2024-03-25: 5 mg/h via INTRAVENOUS
  Filled 2024-03-25: qty 200

## 2024-03-25 MED ORDER — FUROSEMIDE 20 MG PO TABS
20.0000 mg | ORAL_TABLET | ORAL | Status: DC
  Administered 2024-03-26 – 2024-03-28 (×2): 20 mg via ORAL
  Filled 2024-03-25 (×2): qty 1

## 2024-03-25 MED ORDER — CARVEDILOL 25 MG PO TABS
25.0000 mg | ORAL_TABLET | Freq: Two times a day (BID) | ORAL | Status: DC
  Administered 2024-03-26 – 2024-03-28 (×6): 25 mg via ORAL
  Filled 2024-03-25: qty 4
  Filled 2024-03-25 (×2): qty 1
  Filled 2024-03-25: qty 4
  Filled 2024-03-25: qty 1
  Filled 2024-03-25: qty 4

## 2024-03-25 NOTE — ED Notes (Signed)
 Pt to CT, will do u/s IV when returns.

## 2024-03-25 NOTE — ED Notes (Signed)
 BP still high.

## 2024-03-25 NOTE — ED Triage Notes (Addendum)
 Per EMS, pt was feeling dizzy this AM and went to sit down and did pass out. Pt is being treated for sinusitis.

## 2024-03-25 NOTE — ED Notes (Signed)
 Called Lab to collect 2nd troponin. RN was unable to get IV acsess 2x.

## 2024-03-25 NOTE — ED Notes (Addendum)
 Niu MD aware of BP, Per Hilma MD stop it now to see if the Bp can maintain well, if not restart the drip please. Cardene  gtt stopped at this time

## 2024-03-25 NOTE — ED Provider Notes (Signed)
 [facility] Provider Note    Event Date/Time   First MD Initiated Contact with Patient 03/25/24 1605     (approximate)   History   Loss of Consciousness   HPI  Brandi [name] is a 88 year old female with history of EDS, A-fib not on anticoagulation, hypertension, CAD presenting to the emergency department following a syncopal episode.  Patient reports that she woke up this morning with a headache, has had some recent headaches that she attributed to sinusitis that she was recently treated for.  She does report associated nausea with this.  She went to the bathroom and was having a bowel movement when she had a witnessed syncopal episode.  Some down on the toilet, did not hit her head.  Her family member estimates that she was unresponsive for several minutes.  Does feel that she is back at her baseline currently.  Patient notes that this is her fifth syncopal episode this year, previously thought to be related to her medications which were adjusted.  Reviewed discharge summary from 11/19/23.  At that time patient presented with a near syncopal episode.  She was found to have profound orthostasis and her blood pressure medications were adjusted.  Diabetes medications were also adjusted.  Also treated for UTI at that time.  Reviewed cardiology follow-up from 6-20 10/2023.  Ordered for 3-day Holter and given information for follow-up with neurology.  Reviewed neurology visit from 12/24/2023.  Had additional medication adjustments, EEG ordered.  This was performed on 7/10, reportedly normal.  Reviewed office visit from 9/30.  Patient presented with sinus tenderness, treated empirically for acute bacterial sinusitis with prednisone  and amoxicillin .     Physical Exam   Triage Vital Signs: ED Triage Vitals  Encounter Vitals Group     BP 03/25/24 1352 (!) 142/86     Girls Systolic BP Percentile --      Girls Diastolic BP Percentile --      Boys Systolic BP Percentile --       Boys Diastolic BP Percentile --      Pulse Rate 03/25/24 1352 79     Resp 03/25/24 1352 17     Temp 03/25/24 1352 98 F (36.7 C)     Temp Source 03/25/24 1352 Oral     SpO2 03/25/24 1352 97 %     Weight 03/25/24 1353 174 lb (78.9 kg)     Height 03/25/24 1353 5' 1 (1.549 m)     Head Circumference --      Peak Flow --      Pain Score --      Pain Loc --      Pain Education --      Exclude from Growth Chart --     Most recent vital signs: Vitals:   03/25/24 1835 03/25/24 1910  BP: (!) 246/85 (!) 242/86  Pulse: 76 72  Resp: 16 17  Temp:    SpO2: 97% 100%     General: Awake, interactive  CV:  Good peripheral perfusion Resp:  Unlabored respirations, lungs clear to auscultation Abd:  Nondistended.  Neuro:  Alert and oriented, normal extraocular movements, symmetric facial movement, sensation intact over bilateral upper and lower extremities with 5 out of 5 strength.    ED Results / Procedures / Treatments   Labs (all labs ordered are listed, but only abnormal results are displayed) Labs Reviewed  BASIC METABOLIC PANEL WITH GFR - Abnormal; Notable for the following components:  Result Value   Glucose, Bld 269 (*)    Calcium  8.3 (*)    All other components within normal limits  CBC  TROPONIN I (HIGH SENSITIVITY)  TROPONIN I (HIGH SENSITIVITY)     EKG EKG independently reviewed and interpreted by myself demonstrates:  EKG demonstrate sinus rhythm heart rate 78, PR 122, QRS 82, QTc 453, no acute ST changes  RADIOLOGY Imaging independently reviewed and interpreted by myself demonstrates:  CT head without acute bleed, radiologist notes findings consistent consistent with sinusitis  Formal Radiology Read:  CT Head Wo Contrast Result Date: 03/25/2024 CLINICAL DATA:  Headache, new onset (Age >= 51y) EXAM: CT HEAD WITHOUT CONTRAST TECHNIQUE: Contiguous axial images were obtained from the base of the skull through the vertex without intravenous contrast. RADIATION DOSE  REDUCTION: This exam was performed according to the departmental dose-optimization program which includes automated exposure control, adjustment of the mA and/or kV according to patient size and/or use of iterative reconstruction technique. COMPARISON:  Head CT 02/18/2024 FINDINGS: Brain: No intracranial hemorrhage, mass effect, or midline shift. No hydrocephalus. The basilar cisterns are patent. No evidence of territorial infarct or acute ischemia. No extra-axial or intracranial fluid collection. Partially empty sella again seen. Vascular: Atherosclerosis of skullbase vasculature without hyperdense vessel or abnormal calcification. Skull: No fracture or focal lesion. Sinuses/Orbits: Progressive paranasal sinus opacification from prior exam. Fluid levels noted in the left maxillary sinus. Other: None. IMPRESSION: 1. No acute intracranial abnormality. 2. Progressive paranasal sinus opacification from prior exam. Fluid levels in the left maxillary sinus, can be seen with acute sinusitis. Electronically Signed   By: Andrea Gasman M.D.   On: 03/25/2024 17:52    PROCEDURES:  Critical Care performed: Yes, see critical care procedure note(s)  CRITICAL CARE Performed by: Nilsa Dade   Total critical care time: 31 minutes  Critical care time was exclusive of separately billable procedures and treating other patients.  Critical care was necessary to treat or prevent imminent or life-threatening deterioration.  Critical care was time spent personally by me on the following activities: development of treatment plan with patient and/or surrogate as well as nursing, discussions with consultants, evaluation of patient's response to treatment, examination of patient, obtaining history from patient or surrogate, ordering and performing treatments and interventions, ordering and review of laboratory studies, ordering and review of radiographic studies, pulse oximetry and re-evaluation of patient's  condition.   Procedures   MEDICATIONS ORDERED IN ED: Medications  nicardipine  (CARDENE ) 20mg  in 0.86% saline 200ml IV infusion (0.1 mg/ml) (has no administration in time range)  acetaminophen  (TYLENOL ) tablet 650 mg (has no administration in time range)  ondansetron  (ZOFRAN ) injection 4 mg (has no administration in time range)  insulin  aspart (novoLOG ) injection 0-5 Units (has no administration in time range)  insulin  aspart (novoLOG ) injection 0-9 Units (has no administration in time range)  sodium chloride  0.9 % bolus 500 mL (500 mLs Intravenous New Bag/Given 03/25/24 1721)  labetalol  (NORMODYNE ) injection 10 mg (10 mg Intravenous Given 03/25/24 1753)  acetaminophen  (TYLENOL ) tablet 650 mg (650 mg Oral Given 03/25/24 1753)  ketorolac  (TORADOL ) 15 MG/ML injection 15 mg (15 mg Intravenous Given 03/25/24 1847)     IMPRESSION / MDM / ASSESSMENT AND PLAN / ED COURSE  I reviewed the triage vital signs and the nursing notes.  Differential diagnosis includes, but is not limited to, arrhythmia, anemia, electrolyte abnormality, medication adverse effect, intracranial bleed, hypertensive emergency including PRES, dehydration  Patient's presentation is most consistent with acute presentation with potential  threat to life or bodily function.  88 year old female presenting to the emergency department following a witnessed syncopal episode without head trauma.  Stable vitals on presentation.  Reassuring CBC, BMP with mildly elevated glucose without evidence of DKA.  Normal initial troponin.  With new onset headache, will obtain CT head to further evaluate.  Will also plan for second troponin given episode occurred shortly prior to presentation.  Initial BP not significantly elevated, but while in the ER patient developed significant hypertension with systolics in the 230s to 240s, normal diastolics.  CT head resulted without acute bleed.  Negative second troponin.  Patient was treated with IV labetalol   without any significant change in her blood pressure.  She did have significant persistent headache on reevaluation.  Did trial Toradol  with some improvement in her headache but no improvement in her blood pressure.  I do question if some of her headache could be related to a hypertensive emergency given her degree of hypertension.  With this, did discuss nicardipine  drip to help manage her hypertension and admission for further evaluation of her blood pressure as well as her syncopal episode.  Patient is agreeable.  Will reach out to hospitalist team.  Clinical Course as of 03/25/24 1947  Tue Mar 25, 2024  1944 Case discussed with Dr. Hilma.  He will evaluate for anticipated admission. [NR]    Clinical Course User Index [NR] Levander Slate, MD     FINAL CLINICAL IMPRESSION(S) / ED DIAGNOSES   Final diagnoses:  Syncope and collapse  Hypertensive emergency     Rx / DC Orders   ED Discharge Orders     None        Note:  This document was prepared using Dragon voice recognition software and may include unintentional dictation errors.   Levander Slate, MD 03/25/24 [phone]

## 2024-03-25 NOTE — ED Notes (Signed)
 Pt soiled brief. Pt cleaned. Pt tolerated well. Lexie RN at bedside to help readjust patient in bed and look for additional IV.

## 2024-03-25 NOTE — H&P (Signed)
 History and Physical    Brandi [name] FMW:[id number] DOB: Oct 17, 1935 DOA: 03/25/2024  Referring MD/NP/PA:   PCP: Rudolpho Norleen BIRCH, MD   Patient coming from:  The patient is coming from home.     Chief Complaint: Syncope  HPI: Brandi [name] is a 88 y.o. female with medical history significant of HTN, HLD, DM, CAD, s/p of CABG, A fib not on AC, PSVT, NSVT, PAC, iron deficiency anemia, obesity, OSA not on CPAP, chronic pain, syncope, orthostatic status, who presents with syncope.  Per her son at bedside, pt passed out for few minutes in bathroom after having bowel movement around 11 PM.  Patient does not have unilateral numbness or tingling in extremities.  No facial droop or slurred speech.  She moves all extremities normally.  She complains of headache which is located in right side of and front head, constant, severe, aching, nonradiating, not aggravated or dictated by any known factors.  Her headache has improved after received 15 mg of Toradol  in ED. Denies chest pain, cough, SOB.  Patient has nausea, no vomiting, diarrhea or abdominal pain.  No symptoms UTI.  Patient states that she completed course of prednisone  and amoxicillin  yesterday for sinusitis.  She still has some nasal congestion. No fever or chills.   Of note, pt was admitted 5/8 - 6/2 because of syncope which was likely due to profound orthostatic vital signs and polypharmacy.  Patient had extensive workup, including negative MRI of brain for stroke on 5/28,, CTA for PE on 5/31, negative EEG on 5/28, relatively benign Holter except for frequent multiple PACs on 7/9.  2D echo on 11/14/2023 showed EF 60 to 65% with grade 1 diastolic dysfunction.  Her blood pressure medications was also adjusted.  Patient was found to have severely elevated blood pressure 242/68 --> 267/88 in ED. Patient was given IV 10 mg of labetalol  without any improvement.  Cardene  drip was started in ED.  Data reviewed independently and ED Course: pt was found to  have trop 7 --> 8, WBC 6.9, GFR> 60.  Temperature normal, heart rate 79, RR 22, oxygen saturation 100% on room air.  Patient is placed in PCU for observation.  CT of head: 1. No acute intracranial abnormality. 2. Progressive paranasal sinus opacification from prior exam. Fluid levels in the left maxillary sinus, can be seen with acute sinusitis.    EKG: I have personally reviewed.  Sinus rhythm, QTc 453, LAE, borderline IVCD.   Review of Systems:   General: no fevers, chills, no body weight gain,  has fatigue HEENT: no blurry vision, hearing changes or sore throat Respiratory: no dyspnea, coughing, wheezing CV: no chest pain, no palpitations GI: no nausea, vomiting, abdominal pain, diarrhea, constipation GU: no dysuria, burning on urination, increased urinary frequency, hematuria  Ext: no leg edema Neuro: no unilateral weakness, numbness, or tingling, no vision change or hearing loss.  Has syncope and headache. Skin: no rash, no skin tear. MSK: No muscle spasm, no deformity, no limitation of range of movement in spin Heme: No easy bruising.  Travel history: No recent long distant travel.   Allergy:  Allergies  Allergen Reactions   Accupril [Quinapril Hcl] Rash   Micardis [Telmisartan] Other (See Comments)    Dizziness   Quinapril Rash    Past Medical History:  Diagnosis Date   Acquired cyst of kidney    Anemia    Anxiety    Aortic atherosclerosis    Arthritis, degenerative    Atrial fibrillation and  flutter (HCC)    a.) CHA2DS2VASc = 6 (age x 2, sex, HTN, prior MI, T2DM);  b.) s/p LAA closure duirng CABG 05/29/2018; c.) rate/rhythm maintained on oral carvedilol  + verapamil ; no chronic anticoagulation s/p LAA closure   Baden-Walker grade 3 cystocele    Chronic cystitis    Coronary artery disease 08/05/2010   a.) LHC/PCI 08/05/2010: 10% oLM, 70% pLAD-1 (3.0 x 23 mm Xience V DES), 75% pLAD-2, 40% mLAD, 50% dLAD; b.) LHC 07/03/2011: 10% oLM, 10% ISR pLAD, 50% mLAD, LCx  with min luminal irregs - med mgmt; c.) LHC 05/22/2018: 80% mLCx, 70% OM3, 90% oLAD, 60% pLAD, 60% ost 1st sep, 90% p-mRCA, 40% o-pLAD --> CVTS; d.) s/p 3v CABG 05/29/2018   Depression    Diabetic neuropathy (HCC)    Dyspnea    GERD (gastroesophageal reflux disease)    Headache    History of left atrial appendage closure 05/29/2018   HLD (hyperlipidemia)    HTN (hypertension)    Implantable loop recorder present 09/20/2016   a.) has reached EOL; no longer funcitoning/being interrogated   Insomnia    a.) uses melatonin PRN   Murmur    NSTEMI (non-ST elevated myocardial infarction) (HCC) 05/20/2018   a.) troponins were trended: 0.30 --> 0.25 --> 0.22 --> 0.24 ng/L; b.) LHC 05/22/2018: 80% mLCx, 70% OM3, 90% oLAD, 60% pLAD, 60% ost 1st sep, 90% p-mRCA, 40% o-pLAD --> CVTS consult; c.) 3v CABG 05/29/2018: LIMA-LAD, SVG-OM1, SVG-RCA   NSVT (nonsustained ventricular tachycardia) (HCC) 01/2022   a.) holter 01/2022: rates as fast as 195 bpm lasting up to 12 beats   Obesity    OSA on CPAP    PONV (postoperative nausea and vomiting)    Presence of drug coated stent in LAD coronary artery    PSVT (paroxysmal supraventricular tachycardia) 01/2022   a.) holter 01/2022: rates as fast as 195 bpm lasting up to 12 beats   S/P CABG x 3 05/29/2018   a.) 3v CABG at [facility] 05/29/2018: LIMA-LAD, SVG-OM1, SVG-RCA   Sepsis (HCC)    Spinal stenosis    Syncopal episodes 10/2022   Syncope    T2DM (type 2 diabetes mellitus) (HCC)    Unstable angina (HCC)    Urge incontinence of urine     Past Surgical History:  Procedure Laterality Date   BREAST BIOPSY Right 2004   neg   BUNIONECTOMY     CHOLECYSTECTOMY     COLONOSCOPY WITH PROPOFOL  N/A 06/14/2017   Procedure: COLONOSCOPY WITH PROPOFOL ;  Surgeon: Gaylyn Gladis PENNER, MD;  Location: [facility] ENDOSCOPY;  Service: Endoscopy;  Laterality: N/A;   CORONARY ANGIOPLASTY WITH STENT PLACEMENT Left 08/05/2010   Procedure: CORONARY ANGIOPLASTY WITH STENT PLACEMENT;  Location: [facility]; Surgeon(s): Vinie Jude, MD (diagnostic) and Margie Lovelace, MD (interventional)   CORONARY ARTERY BYPASS GRAFT N/A 05/29/2018   Procedure: CORONARY ARTERY BYPASS GRAFT; Location: [facility]; Surgeon: Bernardino Lares, MD   HYSTEROSCOPY WITH D & C N/A 12/14/2022   Procedure: FRACTIONAL DILATATION AND CURETTAGE /HYSTEROSCOPY;  Surgeon: Lovetta Debby PARAS, MD;  Location: [facility] ORS;  Service: Gynecology;  Laterality: N/A;   LEFT ATRIAL APPENDAGE OCCLUSION Left 05/29/2018   Procedure: LEFT ATRIAL APPENDAGE CLOSURE; Location: [facility]; Surgeon: Bernardino Lares, MD   LEFT HEART CATH AND CORONARY ANGIOGRAPHY N/A 05/22/2018   Procedure: LEFT HEART CATH AND CORONARY ANGIOGRAPHY;  Surgeon: Hester Wolm PARAS, MD;  Location: [facility] INVASIVE CV LAB;  Service: Cardiovascular;  Laterality: N/A;   LEFT HEART CATH AND CORONARY ANGIOGRAPHY Left 07/03/2011   Procedure:  LEFT HEART CATH AND CORONARY ANGIOGRAPHY; Location: [facility]; Surgeon: Vinie Jude, MD   LEFT HEART CATH AND CORONARY ANGIOGRAPHY Left 05/22/2018   Procedure: LEFT HEART CATH AND CORONARY ANGIOGRAPHY; Location: [facility]; Surgeon: Vinie Jude, MD   LOOP RECORDER INSERTION N/A 09/20/2016   Procedure: Loop Recorder Insertion;  Surgeon: Marsa Dooms, MD;  Location: [facility] INVASIVE CV LAB;  Service: Cardiovascular;  Laterality: N/A;   SEPTOPLASTY     TOTAL KNEE ARTHROPLASTY Bilateral    XI ROBOTIC ASSISTED INGUINAL HERNIA REPAIR WITH MESH Left 10/14/2020   Procedure: XI ROBOTIC ASSISTED INGUINAL HERNIA REPAIR WITH MESH;  Surgeon: Lane Shope, MD;  Location: [facility] ORS;  Service: General;  Laterality: Left;    Social History:  reports that she has never smoked. She has never used smokeless tobacco. She reports that she does not drink alcohol  and does not use drugs.  Family History:  Family History  Problem Relation Age of Onset   Breast cancer Cousin    Skin cancer Other    Diabetes Other    Hypercholesterolemia Other    Hypertension Other     Bladder Cancer Maternal Aunt    COPD Other    Kidney disease Neg Hx    Kidney cancer Neg Hx      Prior to Admission medications   Medication Sig Start Date End Date Taking? Authorizing Provider  acetaminophen  (TYLENOL ) 500 MG tablet Take 1,000 mg by mouth 3 (three) times daily.    [provider]  aspirin  81 MG EC tablet Take 81 mg by mouth daily.     [provider]  atorvastatin  (LIPITOR) 40 MG tablet Take 40 mg by mouth every evening. 08/05/19   [provider]  budesonide-formoterol  (SYMBICORT) 80-4.5 MCG/ACT inhaler Inhale 2 puffs into the lungs 2 (two) times daily.    [provider]  Calcium  Carb-Cholecalciferol (CALCIUM  CARBONATE+VITAMIN D  PO) Take 1 tablet by mouth daily at 6 (six) AM.    [provider]  carboxymethylcellulose (REFRESH PLUS) 0.5 % SOLN Apply 1-2 drops to eye at bedtime.     [provider]  carvedilol  (COREG ) 25 MG tablet Take 1 tablet (25 mg total) by mouth 2 (two) times daily with a meal. 11/19/23 02/11/24  Dezii, Alexandra, DO  Cyanocobalamin  (VITAMIN B-12 PO) Take 1 tablet by mouth daily.    [provider]  ferrous sulfate  325 (65 FE) MG tablet Take 325 mg by mouth daily with breakfast.    [provider]  gabapentin  (NEURONTIN ) 300 MG capsule Take 1 capsule (300 mg total) by mouth 3 (three) times daily. 11/16/23 02/11/24  Dezii, Alexandra, DO  insulin  glargine (LANTUS  SOLOSTAR) 100 UNIT/ML Solostar Pen Inject 5 Units into the skin every morning. 11/16/23   Dezii, Alexandra, DO  losartan  (COZAAR ) 100 MG tablet Take 100 mg by mouth at bedtime.    [provider]  Melatonin 10 MG TABS Take 1 tablet by mouth at bedtime.    [provider]  mirabegron  ER (MYRBETRIQ ) 50 MG TB24 tablet Take 1 tablet (50 mg total) by mouth daily. 06/19/23   Helon Kirsch A, PA-C  Multiple Vitamins-Minerals (PRESERVISION/LUTEIN ) CAPS Take 1 capsule by mouth 2 (two) times daily. 03/29/20   Fausto Burnard LABOR, DO  nitroGLYCERIN  (NITROSTAT ) 0.4 MG SL tablet Place 0.4 mg under the tongue every 5 (five) minutes as needed for chest pain.    [provider]  progesterone  (PROMETRIUM ) 100 MG capsule Take 100 mg by mouth at bedtime.    [provider]  sodium chloride  (OCEAN) 0.65 % nasal spray Place 1 spray into the nose as needed.    [provider]    Physical Exam: Vitals:   03/25/24 1915 03/25/24 1950 03/25/24 2008 03/25/24 2015  BP: (!) 261/76 (!) 267/88 (!) 223/74 (!) 224/78  Pulse: 79 74 86 95  Resp: 13 13 18 18   Temp:      TempSrc:      SpO2: 97% 98% 96% 97%  Weight:      Height:       General: Not in acute distress HEENT:       Eyes: PERRL, EOMI, no jaundice       ENT: No discharge from the ears and nose, no pharynx injection, no tonsillar enlargement.        Neck: No JVD, no bruit, no mass felt. Heme: No neck lymph node enlargement. Cardiac: S1/S2, RRR, No murmurs, No gallops or rubs. Respiratory: No rales, wheezing, rhonchi or rubs. GI: Soft, nondistended, nontender, no rebound pain, no organomegaly, BS present. GU: No hematuria Ext: No pitting leg edema bilaterally. 1+DP/PT pulse bilaterally. Musculoskeletal: No joint deformities, No joint redness or warmth, no limitation of ROM in spin. Skin: No rashes.  Neuro: Alert, oriented X3, cranial nerves II-XII grossly intact, moves all extremities normally. Muscle strength 5/5 in all extremities, sensation to light touch intact. Psych: Patient is not psychotic, no suicidal or hemocidal ideation.   Labs on Admission: I have personally reviewed following labs and imaging studies  CBC: Recent Labs  Lab 03/25/24 1355  WBC 6.9  HGB 12.6  HCT 38.9  MCV 87.0  PLT 179   Basic Metabolic Panel: Recent Labs  Lab 03/25/24 1355  NA 136  K 3.8  CL 98  CO2 27  GLUCOSE 269*  BUN 12  CREATININE 0.87  CALCIUM  8.3*   GFR: Estimated Creatinine Clearance: 44.1 mL/min (by C-G formula based on SCr of  0.87 mg/dL). Liver Function Tests: No results for input(s): AST, ALT, ALKPHOS, BILITOT, PROT, ALBUMIN in the last 168 hours. No results for input(s): LIPASE, AMYLASE in the last 168 hours. No results for input(s): AMMONIA in the last 168 hours. Coagulation Profile: No results for input(s): INR, PROTIME in the last 168 hours. Cardiac Enzymes: No results for input(s): CKTOTAL, CKMB, CKMBINDEX, TROPONINI in the last 168 hours. BNP (last 3 results) No results for input(s): PROBNP in the last 8760 hours. HbA1C: No results for input(s): HGBA1C in the last 72 hours. CBG: No results for input(s): GLUCAP in the last 168 hours. Lipid Profile: No results for input(s): CHOL, HDL, LDLCALC, TRIG, CHOLHDL, LDLDIRECT in the last 72 hours. Thyroid Function Tests: No results for input(s): TSH, T4TOTAL, FREET4, T3FREE, THYROIDAB in the last 72 hours. Anemia Panel: No results for input(s): VITAMINB12, FOLATE, FERRITIN, TIBC, IRON, RETICCTPCT in the last 72 hours. Urine analysis:    Component Value Date/Time   COLORURINE YELLOW (A) 11/17/2023 0610   APPEARANCEUR CLOUDY (A) 11/17/2023 0610   APPEARANCEUR Cloudy (A) 05/04/2022 1107   LABSPEC 1.014 11/17/2023 0610   LABSPEC 1.010 02/22/2012 1204   PHURINE 5.0 11/17/2023 0610   GLUCOSEU NEGATIVE 11/17/2023 0610   GLUCOSEU Negative 02/22/2012 1204   HGBUR NEGATIVE 11/17/2023 0610   BILIRUBINUR NEGATIVE 11/17/2023 0610   BILIRUBINUR Negative 05/04/2022 1107   BILIRUBINUR Negative 02/22/2012 1204   KETONESUR 5 (A) 11/17/2023 0610   PROTEINUR NEGATIVE 11/17/2023 0610   NITRITE NEGATIVE 11/17/2023 0610   LEUKOCYTESUR MODERATE (A) 11/17/2023 0610   LEUKOCYTESUR 2+  02/22/2012 1204   Sepsis Labs: @LABRCNTIP (procalcitonin:4,lacticidven:4) )No results found for this or any previous visit (from the past 240 hours).   Radiological Exams on Admission:   Assessment/Plan Principal  Problem:   Hypertensive urgency Active Problems:   Recurrent syncope   CAD s/p CABG (coronary artery disease)   HLD (hyperlipidemia)   Atrial fibrillation and flutter (HCC)   Type 2 diabetes mellitus with peripheral neuropathy (HCC)   Headache   PSVT (paroxysmal supraventricular tachycardia)   NSVT (nonsustained ventricular tachycardia) (HCC)   PAC (premature atrial contraction)   Obesity (BMI 30-39.9)   Assessment and Plan:  Hypertensive urgency: Blood pressure up to 267/88.  Received 10 mg of labetalol  IV without improvement.  Cardene  drip was started in ED, currently blood pressure 223/74 when I saw patient in ED.  -will place in PCU for obs -continue Cardene  drip, to lowe 25-30% of SBP tonight -continue home Coreg  25 mg twice daily, Cozaar  100 mg daily, lasix  -Increase hydralazine  dose from 10 to 50 mg tid  Recurrent syncope: Etiology is not clear.  Patient had extensive workup in the past as described in the HPI.  Today's episode may be related to hypertensive urgency.  No focal neurodeficit on physical examination.  CT head negative for acute intracranial abnormalities except for sinus issue.  Differential diagnosis includes including vasovagal syncope, arrhythmia (has hx of A fib, PSVT, NSVT and PAC), orthostatic status.  -Bp control as above - Orthostatic vital signs  - Frequent neuro checks  - IVF:  500 cc of NS in ED - PT/OT eval and treat - consulted Dr. Ammon of cardiology  CAD s/p CABG (coronary artery disease): no CP. -ASA and lipitor  HLD (hyperlipidemia) -Lipitor  Atrial fibrillation and flutter (HCC): not on AC. HR in 70s - Coreg   Type 2 diabetes mellitus with peripheral neuropathy [facility]): Recent A1c 7.6, poorly controlled.  Patien is taking glargine insulin  5 units daily. - Glargine insulin  5 units daily - SSI  Headache: Likely due to hypertensive urgency.  Headache has improved after received 15 mg of Toradol  and started Cardene  drip. -prn  Tylenol   Hx of PSVT (paroxysmal supraventricular tachycardia), NSVT (nonsustained ventricular tachycardia) (HCC), PAC (premature atrial contraction): - Continue Coreg   Obesity (BMI 30-39.9): Patient has Obesity Class I, with body weight 78.9 Kg and BMI 32.88 kg/m2.  - Encourage losing weight - Exercise and healthy diet       DVT ppx:   SQ Lovenox   Code Status: Full code   Family Communication:  Yes, patient's son at bed side.      Disposition Plan:  Anticipate discharge back to previous environment  Consults called:  none  Admission status and Level of care: Progressive:    for obs     Dispo: The patient is from: Home              Anticipated d/c is to: Home              Anticipated d/c date is: 1 day              Patient currently is not medically stable to d/c.    Severity of Illness:  The appropriate patient status for this patient is OBSERVATION. Observation status is judged to be reasonable and necessary in order to provide the required intensity of service to ensure the patient's safety. The patient's presenting symptoms, physical exam findings, and initial radiographic and laboratory data in the context of their medical condition is felt to  place them at decreased risk for further clinical deterioration. Furthermore, it is anticipated that the patient will be medically stable for discharge from the hospital within 2 midnights of admission.        Date of Service 03/25/2024    Caleb Exon [facility]   If 7PM-7AM, please contact night-coverage [email or web address] 03/25/2024, 8:34 PM

## 2024-03-25 NOTE — ED Notes (Signed)
 Update EDP about patients hypertension at 249/77 mAP (127).

## 2024-03-25 NOTE — ED Notes (Signed)
 IV team order put in due to RN unsuccessful IV attempt 2x.

## 2024-03-26 ENCOUNTER — Encounter: Payer: Self-pay | Admitting: Internal Medicine

## 2024-03-26 DIAGNOSIS — R55 Syncope and collapse: Secondary | ICD-10-CM | POA: Diagnosis not present

## 2024-03-26 DIAGNOSIS — R531 Weakness: Secondary | ICD-10-CM

## 2024-03-26 DIAGNOSIS — E1142 Type 2 diabetes mellitus with diabetic polyneuropathy: Secondary | ICD-10-CM | POA: Diagnosis not present

## 2024-03-26 DIAGNOSIS — I160 Hypertensive urgency: Secondary | ICD-10-CM | POA: Diagnosis not present

## 2024-03-26 DIAGNOSIS — E785 Hyperlipidemia, unspecified: Secondary | ICD-10-CM | POA: Diagnosis not present

## 2024-03-26 DIAGNOSIS — I480 Paroxysmal atrial fibrillation: Secondary | ICD-10-CM | POA: Insufficient documentation

## 2024-03-26 DIAGNOSIS — E669 Obesity, unspecified: Secondary | ICD-10-CM

## 2024-03-26 LAB — CBG MONITORING, ED
Glucose-Capillary: 165 mg/dL — ABNORMAL HIGH (ref 70–99)
Glucose-Capillary: 224 mg/dL — ABNORMAL HIGH (ref 70–99)
Glucose-Capillary: 191 mg/dL — ABNORMAL HIGH (ref 70–99)
Glucose-Capillary: 180 mg/dL — ABNORMAL HIGH (ref 70–99)

## 2024-03-26 MED ORDER — HYDRALAZINE HCL 50 MG PO TABS: 50.0000 mg | ORAL_TABLET | Freq: Three times a day (TID) | ORAL | Status: DC

## 2024-03-26 MED ORDER — HYDRALAZINE HCL 50 MG PO TABS
100.0000 mg | ORAL_TABLET | Freq: Three times a day (TID) | ORAL | Status: DC
  Administered 2024-03-27: 100 mg via ORAL
  Filled 2024-03-26: qty 2

## 2024-03-26 MED ORDER — HYDRALAZINE HCL 50 MG PO TABS
50.0000 mg | ORAL_TABLET | Freq: Once | ORAL | Status: AC
  Administered 2024-03-26: 50 mg via ORAL
  Filled 2024-03-26: qty 1

## 2024-03-26 MED ORDER — AMLODIPINE BESYLATE 5 MG PO TABS
5.0000 mg | ORAL_TABLET | Freq: Once | ORAL | Status: AC
  Administered 2024-03-26: 5 mg via ORAL
  Filled 2024-03-26: qty 1

## 2024-03-26 MED ORDER — AMLODIPINE BESYLATE 5 MG PO TABS
10.0000 mg | ORAL_TABLET | Freq: Every day | ORAL | Status: DC
  Administered 2024-03-27: 10 mg via ORAL
  Filled 2024-03-26: qty 2

## 2024-03-26 MED ORDER — AMLODIPINE BESYLATE 5 MG PO TABS
5.0000 mg | ORAL_TABLET | Freq: Every day | ORAL | Status: DC
  Administered 2024-03-26: 5 mg via ORAL
  Filled 2024-03-26: qty 1

## 2024-03-26 MED ORDER — HYDRALAZINE HCL 50 MG PO TABS
100.0000 mg | ORAL_TABLET | Freq: Three times a day (TID) | ORAL | Status: DC
Start: 2024-03-26 — End: 2024-03-26
  Filled 2024-03-26: qty 2

## 2024-03-26 MED ORDER — HYDRALAZINE HCL 50 MG PO TABS
50.0000 mg | ORAL_TABLET | Freq: Three times a day (TID) | ORAL | Status: DC
  Administered 2024-03-26: 50 mg via ORAL
  Filled 2024-03-26: qty 1

## 2024-03-26 NOTE — Assessment & Plan Note (Signed)
 Patient on low-dose Lantus  insulin  plus sliding scale

## 2024-03-26 NOTE — Assessment & Plan Note (Signed)
 Likely secondary to orthostatic hypotension.  Continue to check orthostatics.

## 2024-03-26 NOTE — Assessment & Plan Note (Signed)
 Patient well with nursing staff in the hallway and felt okay.

## 2024-03-26 NOTE — ED Notes (Signed)
 Dr. Cleatus made aware of pt's hypertension at this time

## 2024-03-26 NOTE — Assessment & Plan Note (Signed)
 Initially started on Cardene  drip.  Continue Coreg  25 mg twice daily, losartan  daily.  Will lower the dose of Norvasc  and Aldactone.  Discontinue hydralazine .  Blood pressure may have to be in the 180s for when she stands up so it will drop down below 100.

## 2024-03-26 NOTE — Evaluation (Signed)
 Occupational Therapy Evaluation Patient Details Name: Brandi [name] MRN: [id number] DOB: 27-Jun-1935 Today's Date: 03/26/2024   History of Present Illness   88 y.o. female with medical history significant of HTN, HLD, DM, CAD, s/p of CABG, A fib not on AC, PSVT, NSVT, PAC, iron deficiency anemia, obesity, OSA not on CPAP, chronic pain, syncope, orthostatic status, who presents with syncope.     Clinical Impressions Patient presenting with decreased Ind in self care,balance, functional mobility/transfers, endurance, and safety awareness. Patient reports being mod I with RW for mobility and needing assistance with clothing management. She lives with son who assists with self care needs. Pt limited this session secondary to hypertension when attempting orthostatic BP's. In supine BP is 189/54(92) and then in sitting 199/67(62). Pt performed mobility with CGA. Pt asked to lateral scoot to [facility] but instead stands without assist and takes several steps closer to [facility] and sits. Min guard to return to supine. Session terminated secondary to hypertension.  Patient will benefit from acute OT to increase overall independence in the areas of ADLs, functional mobility, and safety awareness in order to safely discharge.      If plan is discharge home, recommend the following:   A lot of help with walking and/or transfers;A lot of help with bathing/dressing/bathroom     Functional Status Assessment   Patient has had a recent decline in their functional status and demonstrates the ability to make significant improvements in function in a reasonable and predictable amount of time.     Equipment Recommendations   None recommended by OT      Precautions/Restrictions   Precautions Precautions: Fall     Mobility Bed Mobility Overal bed mobility: Needs Assistance Bed Mobility: Supine to Sit, Sit to Supine     Supine to sit: Contact guard Sit to supine: Contact guard assist         Transfers Overall transfer level: Needs assistance Equipment used: 1 person hand held assist Transfers: Sit to/from Stand Sit to Stand: Contact guard assist                      ADL either performed or assessed with clinical judgement   ADL Overall ADL's : Needs assistance/impaired     Grooming: Sitting;Set up                                       Vision Patient Visual Report: No change from baseline              Pertinent Vitals/Pain Pain Assessment Pain Assessment: No/denies pain     Extremity/Trunk Assessment Upper Extremity Assessment Upper Extremity Assessment: Generalized weakness   Lower Extremity Assessment Lower Extremity Assessment: Generalized weakness       Communication Communication Communication: No apparent difficulties   Cognition Arousal: Alert Behavior During Therapy: WFL for tasks assessed/performed Cognition: No apparent impairments                               Following commands: Intact       Cueing  General Comments   Cueing Techniques: Verbal cues              Home Living Family/patient expects to be discharged to:: Private residence Living Arrangements: Children (son) Available Help at Discharge: Family;Available 24 hours/day Type of Home: House Home Access: Stairs  to enter Entrance Stairs-Number of Steps: 3-4 Entrance Stairs-Rails: Right Home Layout: Two level;Able to live on main level with bedroom/bathroom     Bathroom Shower/Tub: Producer, television/film/video: Handicapped height Bathroom Accessibility: No   Home Equipment: Rollator (4 wheels);Shower seat;Toilet riser          Prior Functioning/Environment Prior Level of Function : Working/employed             Mobility Comments: rollator used for ambulation ADLs Comments: son assists with ADLs for self care needs    OT Problem List: Decreased strength;Impaired balance (sitting and/or standing);Decreased  safety awareness;Decreased activity tolerance   OT Treatment/Interventions: Self-care/ADL training;Therapeutic activities;Balance training;Therapeutic exercise;Manual therapy;Energy conservation;Patient/family education      OT Goals(Current goals can be found in the care plan section)   Acute Rehab OT Goals Patient Stated Goal: to go home OT Goal Formulation: With patient/family Time For Goal Achievement: 04/09/24 Potential to Achieve Goals: Fair ADL Goals Pt Will Perform Grooming: with modified independence;standing Pt Will Transfer to Toilet: with modified independence;ambulating Pt Will Perform Toileting - Clothing Manipulation and hygiene: with modified independence;sit to/from stand   OT Frequency:  Min 2X/week    AM-PAC OT 6 Clicks Daily Activity     Outcome Measure Help from another person eating meals?: None Help from another person taking care of personal grooming?: None Help from another person toileting, which includes using toliet, bedpan, or urinal?: A Little Help from another person bathing (including washing, rinsing, drying)?: A Little Help from another person to put on and taking off regular upper body clothing?: A Little Help from another person to put on and taking off regular lower body clothing?: A Little 6 Click Score: 20   End of Session    Activity Tolerance: Treatment limited secondary to medical complications (Comment) (hypertension) Patient left: in bed;with call bell/phone within reach;with bed alarm set;with family/visitor present  OT Visit Diagnosis: Unsteadiness on feet (R26.81);Repeated falls (R29.6)                Time: 9073-9055 OT Time Calculation (min): 18 min Charges:  OT General Charges $OT Visit: 1 Visit OT Evaluation $OT Eval Low Complexity: 1 Low OT Treatments $Therapeutic Activity: 8-22 mins Izetta Claude, MS, OTR/L , CBIS ascom [phone]  03/26/24, 10:34 AM

## 2024-03-26 NOTE — Evaluation (Signed)
 Physical Therapy Evaluation Patient Details Name: JUSTYN BOYSON MRN: [id number] DOB: Jan 20, 1936 Today's Date: 03/26/2024  History of Present Illness  88 y.o. female with medical history significant of HTN, HLD, DM, CAD, s/p of CABG, A fib not on AC, PSVT, NSVT, PAC, iron deficiency anemia, obesity, OSA not on CPAP, chronic pain, syncope, orthostatic status, who presents with syncope.   Clinical Impression  Pt A&Ox4, agreeable to participate in PT evaluation. At baseline, pt is modI with rollator for household amb, requires assistance from her son for basic ADLs. Pt was met semi-supine in bed, CGA-minA for bed mobility due to height of stretcher. Able to perform STS and march in place with RW and CGA, steady balance in standing but increased postural sway with unilateral UE support. Orthostatic vitals assessed during session, significant drop in BP with sit>stand transition, see below for specific readings. Pt reported increased symptoms of dizziness and not feeling well with prolonged standing, additional mobility deferred at this time. Pt was left supine in bed at end of session, all needs in reach. Pt would benefit from skilled PT intervention to address listed deficits (see PT Problem List) and allow for safe return to PLOF.      If plan is discharge home, recommend the following: A little help with walking and/or transfers;A little help with bathing/dressing/bathroom;Assistance with cooking/housework;Assist for transportation;Help with stairs or ramp for entrance   Can travel by private vehicle        Equipment Recommendations Rolling walker (2 wheels)  Recommendations for Other Services       Functional Status Assessment Patient has had a recent decline in their functional status and demonstrates the ability to make significant improvements in function in a reasonable and predictable amount of time.     Precautions / Restrictions Precautions Precautions: Fall Recall of  Precautions/Restrictions: Intact Restrictions Weight Bearing Restrictions Per Provider Order: No      Mobility  Bed Mobility Overal bed mobility: Needs Assistance Bed Mobility: Supine to Sit, Sit to Supine     Supine to sit: Contact guard, HOB elevated Sit to supine: Min assist   General bed mobility comments: minA to assist BLE into bed, suspect due to height of stretcher    Transfers Overall transfer level: Needs assistance Equipment used: Rolling walker (2 wheels) Transfers: Sit to/from Stand Sit to Stand: Contact guard assist, From elevated surface           General transfer comment: no physical assistance for STS    Ambulation/Gait               General Gait Details: NT this session due to pt fatigue and symptoms  Stairs            Wheelchair Mobility     Tilt Bed    Modified Rankin (Stroke Patients Only)       Balance Overall balance assessment: Needs assistance Sitting-balance support: Feet unsupported Sitting balance-Leahy Scale: Fair Sitting balance - Comments: requires at least unilateral UE support to maintain upright   Standing balance support: Bilateral upper extremity supported, Reliant on assistive device for balance Standing balance-Leahy Scale: Fair Standing balance comment: able to stand and march in place with BUE support, steady balance with unilateral UE support for BP reading                             Pertinent Vitals/Pain Pain Assessment Pain Assessment: 0-10 Pain Score: 4  Pain Location: headache  Pain Descriptors / Indicators: Headache Pain Intervention(s): Monitored during session, Repositioned    Home Living Family/patient expects to be discharged to:: Private residence Living Arrangements: Children (son) Available Help at Discharge: Family;Available 24 hours/day Type of Home: House Home Access: Stairs to enter Entrance Stairs-Rails: Right Entrance Stairs-Number of Steps: 3-4   Home Layout:  Two level;Able to live on main level with bedroom/bathroom Home Equipment: Rollator (4 wheels);Shower seat;Toilet riser      Prior Function Prior Level of Function : Working/employed;Needs assist             Mobility Comments: pt reports using rollator for amb, mostly household distances ADLs Comments: pt's son assists with dressing, bathing, cooking     Extremity/Trunk Assessment   Upper Extremity Assessment Upper Extremity Assessment: Defer to OT evaluation    Lower Extremity Assessment Lower Extremity Assessment: Generalized weakness       Communication   Communication Communication: No apparent difficulties    Cognition Arousal: Alert Behavior During Therapy: WFL for tasks assessed/performed   PT - Cognitive impairments: No apparent impairments                       PT - Cognition Comments: A&Ox4, pleasant Following commands: Intact       Cueing Cueing Techniques: Verbal cues     General Comments      Exercises Other Exercises Other Exercises: Orthostatic vitals assessed during session: 126/52 at rest in supine, 135/94 sitting EOB, 94/69 initial standing, 110/45 3 mins standing. Pt became increasingly symptomatic with prolonged standing   Assessment/Plan    PT Assessment Patient needs continued PT services  PT Problem List Decreased strength;Decreased activity tolerance;Decreased balance;Decreased mobility       PT Treatment Interventions DME instruction;Gait training;Stair training;Functional mobility training;Therapeutic activities;Therapeutic exercise;Balance training;Neuromuscular re-education;Patient/family education    PT Goals (Current goals can be found in the Care Plan section)  Acute Rehab PT Goals Patient Stated Goal: to go home PT Goal Formulation: With patient Time For Goal Achievement: 04/09/24 Potential to Achieve Goals: Good    Frequency Min 2X/week     Co-evaluation               AM-PAC PT 6 Clicks  Mobility  Outcome Measure Help needed turning from your back to your side while in a flat bed without using bedrails?: None Help needed moving from lying on your back to sitting on the side of a flat bed without using bedrails?: A Little Help needed moving to and from a bed to a chair (including a wheelchair)?: A Little Help needed standing up from a chair using your arms (e.g., wheelchair or bedside chair)?: A Little Help needed to walk in hospital room?: A Little Help needed climbing 3-5 steps with a railing? : A Lot 6 Click Score: 18    End of Session Equipment Utilized During Treatment: Gait belt Activity Tolerance: Other (comment) (limited by orthostasis) Patient left: in bed;with call bell/phone within reach;with family/visitor present Nurse Communication: Mobility status PT Visit Diagnosis: Unsteadiness on feet (R26.81);Muscle weakness (generalized) (M62.81);Difficulty in walking, not elsewhere classified (R26.2)    Time: 8890-8870 PT Time Calculation (min) (ACUTE ONLY): 20 min   Charges:   PT Evaluation $PT Eval Moderate Complexity: 1 Mod   PT General Charges $$ ACUTE PT VISIT: 1 Visit         Sharlene Mccluskey, SPT

## 2024-03-26 NOTE — Consult Note (Signed)
 [facility] CLINIC CARDIOLOGY CONSULT NOTE       Patient ID: Brandi [name] MRN: [id number] DOB/AGE: 03/04/1936 88 y.o.  Admit date: 03/25/2024 Referring Physician Dr. Caleb Exon Primary Physician Rudolpho Norleen BIRCH, MD  Primary Cardiologist Dr. Florencio Reason for Consultation syncope  HPI: Brandi [name] is a 88 y.o. female  with a past medical history of coronary artery disease s/p CABG x3 2019 (LIMA to LAD, SVG to OM1, SVG to RCA at [facility]), hypertension, hyperlipidemia, obstructive sleep apnea, paroxsymal atrial fibrillation (LAA clipping), and history of syncopal episodes who presented to the ED on 03/25/2024 for syncope. Cardiology was consulted for further evaluation.   Patient states that yesterday evening she had a syncopal episode and, EMS was called and she was brought to the ED for further evaluation.  Workup in the ED notable for creatinine 0.87, potassium 3.8, hemoglobin 12.6, WBC 6.9. Troponins 7 > 8, BNP. EKG in the ED NSR rate 78 bpm, nonischemic. CT head without acute abnormality. Started on Cardene  gtt due to hypertensive urgency.  At the time my evaluation this morning patient is resting comfortably in hospital bed with family at bedside.  She states that yesterday she was sitting in her chair and got up to go to the bathroom and at that time she felt funny.  States that while sitting on the toilet she developed nausea but did not vomit.  After defecating she experienced syncope.  Unknown time of loss of consciousness.  Does endorse some associated lightheadedness and dizziness.  States that she had a headache yesterday but overall this is improved.  She denies any chest pain, shortness of breath, palpitations before or after this episode.  Review of systems complete and found to be negative unless listed above    Past Medical History:  Diagnosis Date   Acquired cyst of kidney    Anemia    Anxiety    Aortic atherosclerosis    Arthritis, degenerative    Atrial fibrillation and  flutter (HCC)    a.) CHA2DS2VASc = 6 (age x 2, sex, HTN, prior MI, T2DM);  b.) s/p LAA closure duirng CABG 05/29/2018; c.) rate/rhythm maintained on oral carvedilol  + verapamil ; no chronic anticoagulation s/p LAA closure   Baden-Walker grade 3 cystocele    Chronic cystitis    Coronary artery disease 08/05/2010   a.) LHC/PCI 08/05/2010: 10% oLM, 70% pLAD-1 (3.0 x 23 mm Xience V DES), 75% pLAD-2, 40% mLAD, 50% dLAD; b.) LHC 07/03/2011: 10% oLM, 10% ISR pLAD, 50% mLAD, LCx with min luminal irregs - med mgmt; c.) LHC 05/22/2018: 80% mLCx, 70% OM3, 90% oLAD, 60% pLAD, 60% ost 1st sep, 90% p-mRCA, 40% o-pLAD --> CVTS; d.) s/p 3v CABG 05/29/2018   Depression    Diabetic neuropathy (HCC)    Dyspnea    GERD (gastroesophageal reflux disease)    Headache    History of left atrial appendage closure 05/29/2018   HLD (hyperlipidemia)    HTN (hypertension)    Implantable loop recorder present 09/20/2016   a.) has reached EOL; no longer funcitoning/being interrogated   Insomnia    a.) uses melatonin PRN   Murmur    NSTEMI (non-ST elevated myocardial infarction) (HCC) 05/20/2018   a.) troponins were trended: 0.30 --> 0.25 --> 0.22 --> 0.24 ng/L; b.) LHC 05/22/2018: 80% mLCx, 70% OM3, 90% oLAD, 60% pLAD, 60% ost 1st sep, 90% p-mRCA, 40% o-pLAD --> CVTS consult; c.) 3v CABG 05/29/2018: LIMA-LAD, SVG-OM1, SVG-RCA   NSVT (nonsustained ventricular tachycardia) (HCC) 01/2022  a.) holter 01/2022: rates as fast as 195 bpm lasting up to 12 beats   Obesity    OSA on CPAP    PONV (postoperative nausea and vomiting)    Presence of drug coated stent in LAD coronary artery    PSVT (paroxysmal supraventricular tachycardia) 01/2022   a.) holter 01/2022: rates as fast as 195 bpm lasting up to 12 beats   S/P CABG x 3 05/29/2018   a.) 3v CABG at [facility] 05/29/2018: LIMA-LAD, SVG-OM1, SVG-RCA   Sepsis (HCC)    Spinal stenosis    Syncopal episodes 10/2022   Syncope    T2DM (type 2 diabetes mellitus) (HCC)    Unstable  angina (HCC)    Urge incontinence of urine     Past Surgical History:  Procedure Laterality Date   BREAST BIOPSY Right 2004   neg   BUNIONECTOMY     CHOLECYSTECTOMY     COLONOSCOPY WITH PROPOFOL  N/A 06/14/2017   Procedure: COLONOSCOPY WITH PROPOFOL ;  Surgeon: Gaylyn Gladis PENNER, MD;  Location: [facility] ENDOSCOPY;  Service: Endoscopy;  Laterality: N/A;   CORONARY ANGIOPLASTY WITH STENT PLACEMENT Left 08/05/2010   Procedure: CORONARY ANGIOPLASTY WITH STENT PLACEMENT; Location: [facility]; Surgeon(s): Vinie Jude, MD (diagnostic) and Margie Lovelace, MD (interventional)   CORONARY ARTERY BYPASS GRAFT N/A 05/29/2018   Procedure: CORONARY ARTERY BYPASS GRAFT; Location: [facility]; Surgeon: Bernardino Lares, MD   HYSTEROSCOPY WITH D & C N/A 12/14/2022   Procedure: FRACTIONAL DILATATION AND CURETTAGE /HYSTEROSCOPY;  Surgeon: Lovetta Debby PARAS, MD;  Location: [facility] ORS;  Service: Gynecology;  Laterality: N/A;   LEFT ATRIAL APPENDAGE OCCLUSION Left 05/29/2018   Procedure: LEFT ATRIAL APPENDAGE CLOSURE; Location: [facility]; Surgeon: Bernardino Lares, MD   LEFT HEART CATH AND CORONARY ANGIOGRAPHY N/A 05/22/2018   Procedure: LEFT HEART CATH AND CORONARY ANGIOGRAPHY;  Surgeon: Hester Wolm PARAS, MD;  Location: [facility] INVASIVE CV LAB;  Service: Cardiovascular;  Laterality: N/A;   LEFT HEART CATH AND CORONARY ANGIOGRAPHY Left 07/03/2011   Procedure: LEFT HEART CATH AND CORONARY ANGIOGRAPHY; Location: [facility]; Surgeon: Vinie Jude, MD   LEFT HEART CATH AND CORONARY ANGIOGRAPHY Left 05/22/2018   Procedure: LEFT HEART CATH AND CORONARY ANGIOGRAPHY; Location: [facility]; Surgeon: Vinie Jude, MD   LOOP RECORDER INSERTION N/A 09/20/2016   Procedure: Loop Recorder Insertion;  Surgeon: Marsa Dooms, MD;  Location: [facility] INVASIVE CV LAB;  Service: Cardiovascular;  Laterality: N/A;   SEPTOPLASTY     TOTAL KNEE ARTHROPLASTY Bilateral    XI ROBOTIC ASSISTED INGUINAL HERNIA REPAIR WITH MESH Left 10/14/2020   Procedure: XI ROBOTIC ASSISTED  INGUINAL HERNIA REPAIR WITH MESH;  Surgeon: Lane Shope, MD;  Location: [facility] ORS;  Service: General;  Laterality: Left;    (Not in a hospital admission)  Social History   Socioeconomic History   Marital status: Widowed    Spouse name: Not on file   Number of children: Not on file   Years of education: Not on file   Highest education level: Not on file  Occupational History   Not on file  Tobacco Use   Smoking status: Never   Smokeless tobacco: Never  Vaping Use   Vaping status: Never Used  Substance and Sexual Activity   Alcohol  use: No   Drug use: No   Sexual activity: Not Currently  Other Topics Concern   Not on file  Social History Narrative   Not on file   Social Drivers of Health   Financial Resource Strain: Low Risk  (02/22/2023)   [facility]  System   Overall Financial Resource Strain (CARDIA)    Difficulty of Paying Living Expenses: Not hard at all  Food Insecurity: No Food Insecurity (11/14/2023)   Hunger Vital Sign    Worried About Running Out of Food in the Last Year: Never true    Ran Out of Food in the Last Year: Never true  Transportation Needs: No Transportation Needs (11/14/2023)   PRAPARE - Administrator, Civil Service (Medical): No    Lack of Transportation (Non-Medical): No  Physical Activity: Not on file  Stress: Not on file  Social Connections: Patient Declined (11/14/2023)   Social Connection and Isolation Panel    Frequency of Communication with Friends and Family: Patient declined    Frequency of Social Gatherings with Friends and Family: Patient declined    Attends Religious Services: Patient declined    Active Member of Clubs or Organizations: Patient declined    Attends Banker Meetings: Patient declined    Marital Status: Patient declined  Intimate Partner Violence: Not At Risk (11/14/2023)   Humiliation, Afraid, Rape, and Kick questionnaire    Fear of Current or Ex-Partner: No     Emotionally Abused: No    Physically Abused: No    Sexually Abused: No    Family History  Problem Relation Age of Onset   Breast cancer Cousin    Skin cancer Other    Diabetes Other    Hypercholesterolemia Other    Hypertension Other    Bladder Cancer Maternal Aunt    COPD Other    Kidney disease Neg Hx    Kidney cancer Neg Hx      Vitals:   03/26/24 1030 03/26/24 1115 03/26/24 1130 03/26/24 1151  BP: (!) 165/54 (!) 135/94 116/66   Pulse: 80 80 73   Resp: 17 17 16    Temp:    97.6 F (36.4 C)  TempSrc:    Oral  SpO2: 100% 100% 99%   Weight:      Height:        PHYSICAL EXAM General: Appearing elderly female, well nourished, in no acute distress. HEENT: Normocephalic and atraumatic. Neck: No JVD.  Lungs: Normal respiratory effort on room air. Clear bilaterally to auscultation. No wheezes, crackles, rhonchi.  Heart: HRRR. Normal S1 and S2 without gallops or murmurs.  Abdomen: Non-distended appearing.  Msk: Normal strength and tone for age. Extremities: Warm and well perfused. No clubbing, cyanosis.  No edema.  Neuro: Alert and oriented X 3. Psych: Answers questions appropriately.   Labs: Basic Metabolic Panel: Recent Labs    03/25/24 1355  NA 136  K 3.8  CL 98  CO2 27  GLUCOSE 269*  BUN 12  CREATININE 0.87  CALCIUM  8.3*   Liver Function Tests: No results for input(s): AST, ALT, ALKPHOS, BILITOT, PROT, ALBUMIN in the last 72 hours. No results for input(s): LIPASE, AMYLASE in the last 72 hours. CBC: Recent Labs    03/25/24 1355  WBC 6.9  HGB 12.6  HCT 38.9  MCV 87.0  PLT 179   Cardiac Enzymes: Recent Labs    03/25/24 1355 03/25/24 1703  TROPONINIHS 7 8   BNP: No results for input(s): BNP in the last 72 hours. D-Dimer: No results for input(s): DDIMER in the last 72 hours. Hemoglobin A1C: No results for input(s): HGBA1C in the last 72 hours. Fasting Lipid Panel: No results for input(s): CHOL, HDL, LDLCALC, TRIG,  CHOLHDL, LDLDIRECT in the last 72 hours. Thyroid Function Tests: No results  for input(s): TSH, T4TOTAL, T3FREE, THYROIDAB in the last 72 hours.  Invalid input(s): FREET3 Anemia Panel: No results for input(s): VITAMINB12, FOLATE, FERRITIN, TIBC, IRON, RETICCTPCT in the last 72 hours.   Radiology: CT Head Wo Contrast Result Date: 03/25/2024 CLINICAL DATA:  Headache, new onset (Age >= 51y) EXAM: CT HEAD WITHOUT CONTRAST TECHNIQUE: Contiguous axial images were obtained from the base of the skull through the vertex without intravenous contrast. RADIATION DOSE REDUCTION: This exam was performed according to the departmental dose-optimization program which includes automated exposure control, adjustment of the mA and/or kV according to patient size and/or use of iterative reconstruction technique. COMPARISON:  Head CT 02/18/2024 FINDINGS: Brain: No intracranial hemorrhage, mass effect, or midline shift. No hydrocephalus. The basilar cisterns are patent. No evidence of territorial infarct or acute ischemia. No extra-axial or intracranial fluid collection. Partially empty sella again seen. Vascular: Atherosclerosis of skullbase vasculature without hyperdense vessel or abnormal calcification. Skull: No fracture or focal lesion. Sinuses/Orbits: Progressive paranasal sinus opacification from prior exam. Fluid levels noted in the left maxillary sinus. Other: None. IMPRESSION: 1. No acute intracranial abnormality. 2. Progressive paranasal sinus opacification from prior exam. Fluid levels in the left maxillary sinus, can be seen with acute sinusitis. Electronically Signed   By: Andrea Gasman M.D.   On: 03/25/2024 17:52    ECHO 10/2023: 1. Left ventricular ejection fraction, by estimation, is 60 to 65%. The left ventricle has normal function. The left ventricle has no regional wall motion abnormalities. There is mild left ventricular hypertrophy. Left ventricular diastolic parameters  are  consistent with Grade I diastolic dysfunction (impaired relaxation).   2. Right ventricular systolic function is normal. The right ventricular  size is normal.   3. The mitral valve is normal in structure. Mild mitral valve  regurgitation. No evidence of mitral stenosis.   4. The aortic valve is normal in structure. Aortic valve regurgitation is  not visualized. No aortic stenosis is present.   5. The inferior vena cava is normal in size with greater than 50%  respiratory variability, suggesting right atrial pressure of 3 mmHg.   TELEMETRY reviewed by me 03/26/2024: Off telemetry  EKG reviewed by me: NSR rate 78 bpm, nonischemic  Data reviewed by me 03/26/2024: last 24h vitals tele labs imaging I/O ED provider note, admission H&P  Principal Problem:   Hypertensive urgency Active Problems:   Recurrent syncope   CAD s/p CABG (coronary artery disease)   Atrial fibrillation and flutter (HCC)   HLD (hyperlipidemia)   Type 2 diabetes mellitus with peripheral neuropathy (HCC)   Headache   PSVT (paroxysmal supraventricular tachycardia)   NSVT (nonsustained ventricular tachycardia) (HCC)   PAC (premature atrial contraction)   Obesity (BMI 30-39.9)    ASSESSMENT AND PLAN:  Brandi [name] is a 88 y.o. female  with a past medical history of coronary artery disease s/p CABG x3 2019 (LIMA to LAD, SVG to OM1, SVG to RCA at [facility]), hypertension, hyperlipidemia, obstructive sleep apnea, paroxsymal atrial fibrillation (LAA clipping), and history of syncopal episodes who presented to the ED on 03/25/2024 for syncope. Cardiology was consulted for further evaluation.   # Syncope # Hypertensive urgency # Coronary artery disease # Paroxysmal atrial fibrillation s/p LAA clipping Patient with a history of syncope for multiple years presented after syncopal episode which followed a bowel movement.  BP significantly elevated in the ED with systolics in the 260s.  Started on nicardipine  drip which was stopped  overnight.  Troponins normal x 2.  EKG without acute ischemic changes.  Recent echo in May with preserved EF, no wall motion abnormalities, grade 1 diastolic dysfunction. - Increase hydralazine  to 100 mg 3 times daily.  Continue amlodipine  5 mg daily, carvedilol  25 mg daily, losartan  100 mg daily.  Can consider up titration of amlodipine  if BP remains elevated. - 3-day Holter 12/2023 with sinus rhythm, no evidence of high-grade AV block or pauses.  Also no evidence of significant arrhythmias but does have frequent PACs. - Suspect noncardiac cause of syncopal episode.  Likely would not recommend additional monitoring given recent monitor after syncopal event earlier this year was unremarkable.  This patient's plan of care was discussed and created with Dr. Ammon and he is in agreement.  Signed: Danita Bloch, PA-C  03/26/2024, 12:04 PM [facility] Cardiology

## 2024-03-26 NOTE — Assessment & Plan Note (Signed)
 Status post LAA clipping.  Heart rate while ambulating with nursing staff was 110.

## 2024-03-26 NOTE — Assessment & Plan Note (Signed)
BMI: 32.88

## 2024-03-26 NOTE — Progress Notes (Signed)
 Progress Note   Patient: Brandi [name] FMW:[id number] DOB: 1935-09-18 DOA: 03/25/2024     0 DOS: the patient was seen and examined on 03/26/2024   Brief hospital course: 88 y.o. female with medical history significant of HTN, HLD, DM, CAD, s/p of CABG, A fib not on AC, PSVT, NSVT, PAC, iron deficiency anemia, obesity, OSA not on CPAP, chronic pain, syncope, orthostatic status, who presents with syncope.   Per her son at bedside, pt passed out for few minutes in bathroom after having bowel movement around 11 PM.  Patient does not have unilateral numbness or tingling in extremities.  No facial droop or slurred speech.  She moves all extremities normally.  She complains of headache which is located in right side of and front head, constant, severe, aching, nonradiating, not aggravated or dictated by any known factors.  Her headache has improved after received 15 mg of Toradol  in ED. Denies chest pain, cough, SOB.  Patient has nausea, no vomiting, diarrhea or abdominal pain.  No symptoms UTI.  Patient states that she completed course of prednisone  and amoxicillin  yesterday for sinusitis.  She still has some nasal congestion. No fever or chills.    Of note, pt was admitted 5/8 - 6/2 because of syncope which was likely due to profound orthostatic vital signs and polypharmacy.  Patient had extensive workup, including negative MRI of brain for stroke on 5/28,, CTA for PE on 5/31, negative EEG on 5/28, relatively benign Holter except for frequent multiple PACs on 7/9.  2D echo on 11/14/2023 showed EF 60 to 65% with grade 1 diastolic dysfunction.  Her blood pressure medications was also adjusted.   Patient was found to have severely elevated blood pressure 242/68 --> 267/88 in ED. Patient was given IV 10 mg of labetalol  without any improvement.  Cardene  drip was started in ED.   Data reviewed independently and ED Course: pt was found to have trop 7 --> 8, WBC 6.9, GFR> 60.  Temperature normal, heart rate 79, RR  22, oxygen saturation 100% on room air.  Patient is placed in PCU for observation.   CT of head: 1. No acute intracranial abnormality. 2. Progressive paranasal sinus opacification from prior exam. Fluid levels in the left maxillary sinus, can be seen with acute sinusitis.  10/8.  Blood pressure very variable.  Off of Cardene  drip.  Titrating up oral medications.  Cardiology increased hydralazine  to 100 mg every 8 hours.  I added Norvasc  5 mg daily.  He then had a low blood pressure.  Blood pressure has come up a little bit.  Will put holding parameters on hydralazine  and will decrease to 50 mg every 8 hours.  Watch overnight.    Assessment and Plan: * Hypertensive urgency Initially started on Cardene  drip.  Continue Coreg  25 mg twice daily, losartan  daily.  Hydralazine  increased to 100 mg 3 times daily by cardiology.  I started amlodipine  5 mg this morning.  Blood pressure very variable.  Need to check orthostatic vitals.  Recurrent syncope Check orthostatic vitals.  Could be secondary to very high blood pressure upon admission.  Hyperlipidemia, unspecified On atorvastatin   Type 2 diabetes mellitus with peripheral neuropathy (HCC) Patient on low-dose Lantus  insulin  plus sliding scale  Obesity (BMI 30-39.9) BMI 32.88  Paroxysmal atrial fibrillation (HCC) Status post LAA clipping  Weakness Patient stated she only marched in place and did not walk around today.        Subjective: Patient passed out a few minutes after having  a bowel movement.  Found to have very high blood pressure.  Physical Exam: Vitals:   03/26/24 1430 03/26/24 1500 03/26/24 1530 03/26/24 1600  BP: (!) 140/51 (!) 108/52 (!) 131/49 (!) 150/59  Pulse: 72 71 71 71  Resp:  16 16 20   Temp:      TempSrc:      SpO2: 100% 100% 100% 100%  Weight:      Height:       Physical Exam HENT:     Head: Normocephalic.  Eyes:     General: Lids are normal.  Cardiovascular:     Rate and Rhythm: Normal rate and  regular rhythm.     Heart sounds: Normal heart sounds, S1 normal and S2 normal.  Pulmonary:     Breath sounds: No decreased breath sounds, wheezing, rhonchi or rales.  Abdominal:     Palpations: Abdomen is soft.     Tenderness: There is no abdominal tenderness.  Musculoskeletal:     Right lower leg: No swelling.     Left lower leg: No swelling.  Skin:    General: Skin is warm.     Findings: No rash.  Neurological:     Mental Status: She is alert and oriented to person, place, and time.     Data Reviewed: Cardiac enzymes negative, creatinine 0.87, CBC normal range CT scan of the head showed no acute intracranial abnormality Family Communication: Spoke with son at the bedside  Disposition: Status is: Observation Blood pressure very variable.  Orthostatic vital signs need to be checked.  Planned Discharge Destination: Home    Time spent: 28 minutes  Author: Charlie Patterson, MD 03/26/2024 4:09 PM  For on call review [email or web address].

## 2024-03-26 NOTE — Assessment & Plan Note (Signed)
 On atorvastatin

## 2024-03-26 NOTE — Hospital Course (Signed)
 88 y.o. female with medical history significant of HTN, HLD, DM, CAD, s/p of CABG, A fib not on AC, PSVT, NSVT, PAC, iron deficiency anemia, obesity, OSA not on CPAP, chronic pain, syncope, orthostatic status, who presents with syncope.   Per her son at bedside, pt passed out for few minutes in bathroom after having bowel movement around 11 PM.  Patient does not have unilateral numbness or tingling in extremities.  No facial droop or slurred speech.  She moves all extremities normally.  She complains of headache which is located in right side of and front head, constant, severe, aching, nonradiating, not aggravated or dictated by any known factors.  Her headache has improved after received 15 mg of Toradol  in ED. Denies chest pain, cough, SOB.  Patient has nausea, no vomiting, diarrhea or abdominal pain.  No symptoms UTI.  Patient states that she completed course of prednisone  and amoxicillin  yesterday for sinusitis.  She still has some nasal congestion. No fever or chills.    Of note, pt was admitted 5/8 - 6/2 because of syncope which was likely due to profound orthostatic vital signs and polypharmacy.  Patient had extensive workup, including negative MRI of brain for stroke on 5/28,, CTA for PE on 5/31, negative EEG on 5/28, relatively benign Holter except for frequent multiple PACs on 7/9.  2D echo on 11/14/2023 showed EF 60 to 65% with grade 1 diastolic dysfunction.  Her blood pressure medications was also adjusted.   Patient was found to have severely elevated blood pressure 242/68 --> 267/88 in ED. Patient was given IV 10 mg of labetalol  without any improvement.  Cardene  drip was started in ED.   Data reviewed independently and ED Course: pt was found to have trop 7 --> 8, WBC 6.9, GFR> 60.  Temperature normal, heart rate 79, RR 22, oxygen saturation 100% on room air.  Patient is placed in PCU for observation.   CT of head: 1. No acute intracranial abnormality. 2. Progressive paranasal sinus  opacification from prior exam. Fluid levels in the left maxillary sinus, can be seen with acute sinusitis.  10/8.  Blood pressure very variable.  Off of Cardene  drip.  Titrating up oral medications.  Cardiology increased hydralazine  to 100 mg every 8 hours.  I added Norvasc  5 mg daily.  He then had a low blood pressure.  Blood pressure has come up a little bit.  Will put holding parameters on hydralazine  and will decrease to 50 mg every 8 hours.  Watch overnight. 10/9.  With orthostatic vital signs.  Patient's blood pressure dropped down to systolic in the 70s.  Patient did not feel well.  Need to cut back on medications.  Unable to get an a.m. cortisol since patient recently on steroids. 10/10.  Patient with severe orthostatic hypotension.  Patient ambulated in hallway with nursing staff and felt okay.  Patient advised to go from a lying position to a sitting position and stay there for 5 minutes.  Then stand before walking away for a few minutes.  TED hose.  Must check standing blood pressure prior to adjusting blood pressure medications.

## 2024-03-27 DIAGNOSIS — E876 Hypokalemia: Secondary | ICD-10-CM

## 2024-03-27 DIAGNOSIS — E1142 Type 2 diabetes mellitus with diabetic polyneuropathy: Secondary | ICD-10-CM | POA: Diagnosis not present

## 2024-03-27 DIAGNOSIS — R55 Syncope and collapse: Secondary | ICD-10-CM | POA: Diagnosis not present

## 2024-03-27 DIAGNOSIS — I161 Hypertensive emergency: Secondary | ICD-10-CM | POA: Diagnosis not present

## 2024-03-27 DIAGNOSIS — E785 Hyperlipidemia, unspecified: Secondary | ICD-10-CM | POA: Diagnosis not present

## 2024-03-27 LAB — GLUCOSE, CAPILLARY: Glucose-Capillary: 150 mg/dL — ABNORMAL HIGH (ref 70–99)

## 2024-03-27 LAB — CBG MONITORING, ED
Glucose-Capillary: 171 mg/dL — ABNORMAL HIGH (ref 70–99)
Glucose-Capillary: 191 mg/dL — ABNORMAL HIGH (ref 70–99)
Glucose-Capillary: 169 mg/dL — ABNORMAL HIGH (ref 70–99)

## 2024-03-27 LAB — BASIC METABOLIC PANEL WITH GFR
Anion gap: 7 (ref 5–15)
BUN: 9 mg/dL (ref 8–23)
CO2: 25 mmol/L (ref 22–32)
Calcium: 8.1 mg/dL — ABNORMAL LOW (ref 8.9–10.3)
Chloride: 106 mmol/L (ref 98–111)
Creatinine, Ser: 0.63 mg/dL (ref 0.44–1.00)
GFR, Estimated: >60 mL/min (ref >60)
Glucose, Bld: 146 mg/dL — ABNORMAL HIGH (ref 70–99)
Potassium: 3.4 mmol/L — ABNORMAL LOW (ref 3.5–5.1)
Sodium: 138 mmol/L (ref 135–145)

## 2024-03-27 LAB — CBC
HCT: 38.8 % (ref 36.0–46.0)
Hemoglobin: 12.6 g/dL (ref 12.0–15.0)
MCH: 28.2 pg (ref 26.0–34.0)
MCHC: 32.5 g/dL (ref 30.0–36.0)
MCV: 86.8 fL (ref 80.0–100.0)
Platelets: 174 K/uL (ref 150–400)
RBC: 4.47 MIL/uL (ref 3.87–5.11)
RDW: 14.6 % (ref 11.5–15.5)
WBC: 9 K/uL (ref 4.0–10.5)
nRBC: 0 % (ref 0.0–0.2)

## 2024-03-27 MED ORDER — SPIRONOLACTONE 25 MG PO TABS
50.0000 mg | ORAL_TABLET | Freq: Every day | ORAL | Status: DC
  Administered 2024-03-27: 50 mg via ORAL
  Filled 2024-03-27: qty 2

## 2024-03-27 MED ORDER — POTASSIUM CHLORIDE ER 10 MEQ PO TBCR
20.0000 meq | EXTENDED_RELEASE_TABLET | Freq: Once | ORAL | Status: AC
  Administered 2024-03-27: 20 meq via ORAL
  Filled 2024-03-27: qty 2

## 2024-03-27 MED ORDER — AMLODIPINE BESYLATE 5 MG PO TABS
5.0000 mg | ORAL_TABLET | Freq: Every day | ORAL | Status: DC
  Administered 2024-03-28: 5 mg via ORAL
  Filled 2024-03-27: qty 1

## 2024-03-27 MED ORDER — SPIRONOLACTONE 25 MG PO TABS: 25.0000 mg | ORAL_TABLET | Freq: Every day | ORAL | Status: DC

## 2024-03-27 MED ORDER — SODIUM CHLORIDE 0.9 % IV BOLUS
500.0000 mL | Freq: Once | INTRAVENOUS | Status: AC
  Administered 2024-03-27: 500 mL via INTRAVENOUS

## 2024-03-27 MED ORDER — SPIRONOLACTONE 12.5 MG HALF TABLET
12.5000 mg | ORAL_TABLET | Freq: Every day | ORAL | Status: DC
  Administered 2024-03-28: 12.5 mg via ORAL
  Filled 2024-03-27: qty 1

## 2024-03-27 NOTE — Assessment & Plan Note (Signed)
 Blood pressure dropped down into the 70s with treating high blood pressure.  Need to cut back on meds.  Patient's blood pressure will likely have to be in the 180s at rest.  Need to check blood pressure standing with every set of vitals.

## 2024-03-27 NOTE — TOC Initial Note (Signed)
 Transition of Care [facility]) - Initial/Assessment Note    Patient Details  Name: Brandi [name] MRN: [id number] Date of Birth: 1936/01/07  Transition of Care [facility] CM/SW Contact:    Lauraine JAYSON Carpen, LCSW Phone Number: 03/27/2024, 3:17 PM  Clinical Narrative:  CSW met with patient. Son at bedside. CSW introduced role and explained that therapy recommendations would be discussed. Patient is agreeable to home health. She recently worked with [facility] and would like to use them again. They have accepted her referral for PT and OT. Patient declined RW, preferring to use her rollator. No further concerns. CSW will continue to follow patient for support and facilitate return home once stable. Son will likely transport her home at discharge.              Expected Discharge Plan: Home w Home Health Services Barriers to Discharge: Continued Medical Work up   Patient Goals and CMS Choice     Choice offered to / list presented to : Patient, Adult Children      Expected Discharge Plan and Services     Post Acute Care Choice: Home Health Living arrangements for the past 2 months: Single Family Home                           HH Arranged: PT, OT HH Agency: [facility] Date [facility] Agency Contacted: 03/27/24   Representative spoke with at [facility] Agency: Larraine  Prior Living Arrangements/Services Living arrangements for the past 2 months: Single Family Home Lives with:: Adult Children Patient language and need for interpreter reviewed:: Yes Do you feel safe going back to the place where you live?: Yes      Need for Family Participation in Patient Care: Yes (Comment) Care giver support system in place?: Yes (comment) Current home services: DME Criminal Activity/Legal Involvement Pertinent to Current Situation/Hospitalization: No - Comment as needed  Activities of Daily Living      Permission Sought/Granted Permission sought to share information with : Facility Medical sales representative,  Family Supports Permission granted to share information with : Yes, Verbal Permission Granted  Share Information with NAME: Rad Martelle  Permission granted to share info w AGENCY: [facility]  Permission granted to share info w Relationship: Son  Permission granted to share info w Contact Information: [phone]  Emotional Assessment Appearance:: Appears stated age Attitude/Demeanor/Rapport: Engaged, Gracious Affect (typically observed): Accepting, Appropriate, Calm, Pleasant Orientation: : Oriented to Self, Oriented to Place, Oriented to  Time, Oriented to Situation Alcohol  / Substance Use: Not Applicable Psych Involvement: No (comment)  Admission diagnosis:  Hypertensive urgency [I16.0] Patient Active Problem List   Diagnosis Date Noted   Hypokalemia 03/27/2024   Paroxysmal atrial fibrillation (HCC) 03/26/2024   Hypertensive urgency 03/25/2024   Hyperlipidemia, unspecified 03/25/2024   Type 2 diabetes mellitus with peripheral neuropathy (HCC) 03/25/2024   Headache 03/25/2024   PSVT (paroxysmal supraventricular tachycardia) 03/25/2024   NSVT (nonsustained ventricular tachycardia) (HCC) 03/25/2024   PAC (premature atrial contraction) 03/25/2024   Obesity (BMI 30-39.9) 03/25/2024   Abnormal NCS (nerve conduction studies) (03/07/2021) 02/11/2024   Polyneuropathy, peripheral sensorimotor axonal 02/11/2024   Diabetic sensorimotor polyneuropathy (HCC) 02/11/2024   Chronic painful diabetic neuropathy (HCC) 02/11/2024   SIRS (systemic inflammatory response syndrome) (HCC) 11/17/2023   Essential hypertension 11/14/2023   Spondylosis without myelopathy or radiculopathy, lumbosacral region 11/13/2023   Chronic low back pain (Right) w/o sciatica 10/30/2023   Lumbar facet joint syndrome 10/30/2023  Chronic lower extremity pain (2ry area of Pain) (Right) 09/04/2023   Lumbosacral radiculopathy at L5 (Right) 09/04/2023   Lumbosacral radiculopathy at S1 (Right) 09/04/2023   DDD,  lumbosacral w/ LBP & LEP 09/04/2023   Lumbar facet hypertrophy (Multilevel) (Bilateral) 09/04/2023   Lumbar foraminal stenosis 09/04/2023   Chronic pain syndrome 08/15/2023   Pharmacologic therapy 08/15/2023   Disorder of skeletal system 08/15/2023   Problems influencing health status 08/15/2023   Hypocalcemia 08/15/2023   Decreased GFR 08/15/2023   Elevated hemoglobin A1c 08/15/2023   Abnormal CT scan, cervical spine (07/24/2021) 08/15/2023   Abnormal MRI, lumbar spine (05/09/2023) 08/15/2023   Chronic low back pain (1ry area of Pain) (Bilateral) (R>L) w/ sciatica (Right) 08/15/2023   Low back pain radiating to leg (Right) 08/15/2023   Pain of lateral upper thigh (Right) (Intermittent) 08/15/2023   Chronic sacroiliac joint pain (Right) 08/15/2023   Somatic dysfunction of sacroiliac joint (Right) 08/15/2023   Grade 1 Anterolisthesis of lumbosacral spine of L3/L4 and L5/S1 08/15/2023   Lumbar facet joint pain 08/15/2023   Hypertensive emergency 05/10/2023   Peripheral vertigo 05/10/2023   Acute sinusitis 05/10/2023   A-fib s/p ligation of left atrial appendage 05/10/2023   Thickened endometrium 09/13/2022   Anemia of chronic disease 02/05/2022   Status post laparoscopic hernia repair 10/28/2020   AKI (acute kidney injury) 04/13/2020   Acute hyponatremia 04/13/2020   Dehydration 04/13/2020   Diarrhea 04/13/2020   Abdominal pain 04/13/2020   Recurrent syncope 03/28/2020   CAD s/p CABG (coronary artery disease) 03/28/2020   Chronic kidney disease, stage III (moderate) (HCC) 02/12/2019   Background diabetic retinopathy associated with type 2 diabetes mellitus (HCC) 12/06/2018   Decreased activities of daily living (ADL) 06/05/2018   Weakness 06/05/2018   S/P CABG x 3 06/04/2018   Presence of drug coated stent in LAD coronary artery 05/27/2018   Anxiety 05/25/2018   Atrial fibrillation and flutter (HCC) 05/25/2018   H/O gastroesophageal reflux (GERD) 05/25/2018   Hyperlipidemia  05/25/2018   Unstable angina (HCC) 05/21/2018   Chest pain 05/20/2018   Sacroiliitis 02/21/2018   Positional lightheadedness 03/14/2017   Diabetes mellitus type 2, insulin  dependent (HCC) 03/14/2017   Action tremor 09/11/2016   Headache disorder 09/11/2016   Severe sepsis (HCC) 04/14/2016   Orthostatic hypotension 11/10/2015   Diabetic neuropathy (HCC) 10/06/2015   Urinary tract infection without hematuria 09/13/2015   Cystocele, grade 3 09/13/2015   Incontinence 09/13/2015   Depression, major, in remission 12/23/2014   Pernicious anemia 12/23/2014   Coronary arteriosclerosis 01/06/2014   OSA on CPAP 01/06/2014   Benign essential hypertension 12/01/2013   Severe obesity (BMI 35.0-39.9) with comorbidity (HCC) 12/01/2013   Acquired cyst of kidney 05/02/2013   Chronic cystitis 03/11/2012   Urge incontinence 03/11/2012   PCP:  Rudolpho Norleen BIRCH, MD Pharmacy:   [id number] [place], [place] - [address] AT [facility] OF [address] [place] [address] Phone: [phone] Fax: [phone]     Social Drivers of Health (SDOH) Social History: SDOH Screenings   Food Insecurity: No Food Insecurity (11/14/2023)  Housing: Low Risk  (11/14/2023)  Transportation Needs: No Transportation Needs (11/14/2023)  Utilities: Not At Risk (11/14/2023)  Depression (PHQ2-9): Low Risk  (02/11/2024)  Financial Resource Strain: Low Risk  (02/22/2023)   Received from [facility]  Social Connections: Patient Declined (11/14/2023)  Tobacco Use: Low Risk  (03/25/2024)   SDOH Interventions:  Readmission Risk Interventions    11/19/2023   12:35 PM  Readmission Risk Prevention Plan  Transportation Screening Complete  PCP or Specialist Appt within 3-5 Days Complete  HRI or Home Care Consult Complete  Social Work Consult for Recovery Care Planning/Counseling Complete  Palliative Care Screening Not Applicable  Medication Review Special educational needs teacher) Complete

## 2024-03-27 NOTE — Progress Notes (Signed)
 [facility] CLINIC CARDIOLOGY PROGRESS NOTE       Patient ID: Brandi [name] MRN: [id number] DOB/AGE: 1936/03/25 88 y.o.  Admit date: 03/25/2024 Referring Physician Dr. Caleb Exon Primary Physician Rudolpho Norleen BIRCH, MD  Primary Cardiologist Dr. Florencio Reason for Consultation syncope  HPI: Brandi [name] is a 88 y.o. female  with a past medical history of coronary artery disease s/p CABG x3 2019 (LIMA to LAD, SVG to OM1, SVG to RCA at [facility]), hypertension, hyperlipidemia, obstructive sleep apnea, paroxsymal atrial fibrillation (LAA clipping), and history of syncopal episodes who presented to the ED on 03/25/2024 for syncope. Cardiology was consulted for further evaluation.   Interval history: -Patient seen and examined this AM, resting in hospital bed.  -Reports some back pain and mild headache this AM. No cardiac complaints.  -BP elevated again today. HR controlled, no events on tele.   Review of systems complete and found to be negative unless listed above    Past Medical History:  Diagnosis Date   Acquired cyst of kidney    Anemia    Anxiety    Aortic atherosclerosis    Arthritis, degenerative    Atrial fibrillation and flutter (HCC)    a.) CHA2DS2VASc = 6 (age x 2, sex, HTN, prior MI, T2DM);  b.) s/p LAA closure duirng CABG 05/29/2018; c.) rate/rhythm maintained on oral carvedilol  + verapamil ; no chronic anticoagulation s/p LAA closure   Baden-Walker grade 3 cystocele    Chronic cystitis    Coronary artery disease 08/05/2010   a.) LHC/PCI 08/05/2010: 10% oLM, 70% pLAD-1 (3.0 x 23 mm Xience V DES), 75% pLAD-2, 40% mLAD, 50% dLAD; b.) LHC 07/03/2011: 10% oLM, 10% ISR pLAD, 50% mLAD, LCx with min luminal irregs - med mgmt; c.) LHC 05/22/2018: 80% mLCx, 70% OM3, 90% oLAD, 60% pLAD, 60% ost 1st sep, 90% p-mRCA, 40% o-pLAD --> CVTS; d.) s/p 3v CABG 05/29/2018   Depression    Diabetic neuropathy (HCC)    Dyspnea    GERD (gastroesophageal reflux disease)    Headache    History of left  atrial appendage closure 05/29/2018   HLD (hyperlipidemia)    HTN (hypertension)    Implantable loop recorder present 09/20/2016   a.) has reached EOL; no longer funcitoning/being interrogated   Insomnia    a.) uses melatonin PRN   Murmur    NSTEMI (non-ST elevated myocardial infarction) (HCC) 05/20/2018   a.) troponins were trended: 0.30 --> 0.25 --> 0.22 --> 0.24 ng/L; b.) LHC 05/22/2018: 80% mLCx, 70% OM3, 90% oLAD, 60% pLAD, 60% ost 1st sep, 90% p-mRCA, 40% o-pLAD --> CVTS consult; c.) 3v CABG 05/29/2018: LIMA-LAD, SVG-OM1, SVG-RCA   NSVT (nonsustained ventricular tachycardia) (HCC) 01/2022   a.) holter 01/2022: rates as fast as 195 bpm lasting up to 12 beats   Obesity    OSA on CPAP    PONV (postoperative nausea and vomiting)    Presence of drug coated stent in LAD coronary artery    PSVT (paroxysmal supraventricular tachycardia) 01/2022   a.) holter 01/2022: rates as fast as 195 bpm lasting up to 12 beats   S/P CABG x 3 05/29/2018   a.) 3v CABG at [facility] 05/29/2018: LIMA-LAD, SVG-OM1, SVG-RCA   Sepsis (HCC)    Spinal stenosis    Syncopal episodes 10/2022   Syncope    T2DM (type 2 diabetes mellitus) (HCC)    Unstable angina (HCC)    Urge incontinence of urine     Past Surgical History:  Procedure Laterality Date  BREAST BIOPSY Right 2004   neg   BUNIONECTOMY     CHOLECYSTECTOMY     COLONOSCOPY WITH PROPOFOL  N/A 06/14/2017   Procedure: COLONOSCOPY WITH PROPOFOL ;  Surgeon: Gaylyn Gladis PENNER, MD;  Location: [facility] ENDOSCOPY;  Service: Endoscopy;  Laterality: N/A;   CORONARY ANGIOPLASTY WITH STENT PLACEMENT Left 08/05/2010   Procedure: CORONARY ANGIOPLASTY WITH STENT PLACEMENT; Location: [facility]; Surgeon(s): Vinie Jude, MD (diagnostic) and Margie Lovelace, MD (interventional)   CORONARY ARTERY BYPASS GRAFT N/A 05/29/2018   Procedure: CORONARY ARTERY BYPASS GRAFT; Location: [facility]; Surgeon: Bernardino Lares, MD   HYSTEROSCOPY WITH D & C N/A 12/14/2022   Procedure: FRACTIONAL  DILATATION AND CURETTAGE /HYSTEROSCOPY;  Surgeon: Lovetta Debby PARAS, MD;  Location: [facility] ORS;  Service: Gynecology;  Laterality: N/A;   LEFT ATRIAL APPENDAGE OCCLUSION Left 05/29/2018   Procedure: LEFT ATRIAL APPENDAGE CLOSURE; Location: [facility]; Surgeon: Bernardino Lares, MD   LEFT HEART CATH AND CORONARY ANGIOGRAPHY N/A 05/22/2018   Procedure: LEFT HEART CATH AND CORONARY ANGIOGRAPHY;  Surgeon: Hester Wolm PARAS, MD;  Location: [facility] INVASIVE CV LAB;  Service: Cardiovascular;  Laterality: N/A;   LEFT HEART CATH AND CORONARY ANGIOGRAPHY Left 07/03/2011   Procedure: LEFT HEART CATH AND CORONARY ANGIOGRAPHY; Location: [facility]; Surgeon: Vinie Jude, MD   LEFT HEART CATH AND CORONARY ANGIOGRAPHY Left 05/22/2018   Procedure: LEFT HEART CATH AND CORONARY ANGIOGRAPHY; Location: [facility]; Surgeon: Vinie Jude, MD   LOOP RECORDER INSERTION N/A 09/20/2016   Procedure: Loop Recorder Insertion;  Surgeon: Marsa Dooms, MD;  Location: [facility] INVASIVE CV LAB;  Service: Cardiovascular;  Laterality: N/A;   SEPTOPLASTY     TOTAL KNEE ARTHROPLASTY Bilateral    XI ROBOTIC ASSISTED INGUINAL HERNIA REPAIR WITH MESH Left 10/14/2020   Procedure: XI ROBOTIC ASSISTED INGUINAL HERNIA REPAIR WITH MESH;  Surgeon: Lane Shope, MD;  Location: [facility] ORS;  Service: General;  Laterality: Left;    (Not in a hospital admission)  Social History   Socioeconomic History   Marital status: Widowed    Spouse name: Not on file   Number of children: Not on file   Years of education: Not on file   Highest education level: Not on file  Occupational History   Not on file  Tobacco Use   Smoking status: Never   Smokeless tobacco: Never  Vaping Use   Vaping status: Never Used  Substance and Sexual Activity   Alcohol  use: No   Drug use: No   Sexual activity: Not Currently  Other Topics Concern   Not on file  Social History Narrative   Not on file   Social Drivers of Health   Financial Resource Strain: Low Risk   (02/22/2023)   Received from [facility]   Overall Financial Resource Strain (CARDIA)    Difficulty of Paying Living Expenses: Not hard at all  Food Insecurity: No Food Insecurity (11/14/2023)   Hunger Vital Sign    Worried About Running Out of Food in the Last Year: Never true    Ran Out of Food in the Last Year: Never true  Transportation Needs: No Transportation Needs (11/14/2023)   PRAPARE - Administrator, Civil Service (Medical): No    Lack of Transportation (Non-Medical): No  Physical Activity: Not on file  Stress: Not on file  Social Connections: Patient Declined (11/14/2023)   Social Connection and Isolation Panel    Frequency of Communication with Friends and Family: Patient declined    Frequency of Social Gatherings with Friends and Family: Patient declined  Attends Religious Services: Patient declined    Active Member of Clubs or Organizations: Patient declined    Attends Banker Meetings: Patient declined    Marital Status: Patient declined  Intimate Partner Violence: Not At Risk (11/14/2023)   Humiliation, Afraid, Rape, and Kick questionnaire    Fear of Current or Ex-Partner: No    Emotionally Abused: No    Physically Abused: No    Sexually Abused: No    Family History  Problem Relation Age of Onset   Breast cancer Cousin    Skin cancer Other    Diabetes Other    Hypercholesterolemia Other    Hypertension Other    Bladder Cancer Maternal Aunt    COPD Other    Kidney disease Neg Hx    Kidney cancer Neg Hx      Vitals:   03/27/24 0300 03/27/24 0510 03/27/24 0600 03/27/24 0630  BP: (!) 155/69 (!) 162/78 (!) 169/67 (!) 174/65  Pulse: 72 84 81 83  Resp: 18  (!) 23 13  Temp:  97.9 F (36.6 C)    TempSrc:  Oral    SpO2: 98% 100% 98% 98%  Weight:      Height:        PHYSICAL EXAM General: Appearing elderly female, well nourished, in no acute distress. HEENT: Normocephalic and atraumatic. Neck: No JVD.  Lungs:  Normal respiratory effort on room air. Clear bilaterally to auscultation. No wheezes, crackles, rhonchi.  Heart: HRRR. Normal S1 and S2 without gallops or murmurs.  Abdomen: Non-distended appearing.  Msk: Normal strength and tone for age. Extremities: Warm and well perfused. No clubbing, cyanosis.  No edema.  Neuro: Alert and oriented X 3. Psych: Answers questions appropriately.   Labs: Basic Metabolic Panel: Recent Labs    03/25/24 1355 03/27/24 0519  NA 136 138  K 3.8 3.4*  CL 98 106  CO2 27 25  GLUCOSE 269* 146*  BUN 12 9  CREATININE 0.87 0.63  CALCIUM  8.3* 8.1*   Liver Function Tests: No results for input(s): AST, ALT, ALKPHOS, BILITOT, PROT, ALBUMIN in the last 72 hours. No results for input(s): LIPASE, AMYLASE in the last 72 hours. CBC: Recent Labs    03/25/24 1355 03/27/24 0519  WBC 6.9 9.0  HGB 12.6 12.6  HCT 38.9 38.8  MCV 87.0 86.8  PLT 179 174   Cardiac Enzymes: Recent Labs    03/25/24 1355 03/25/24 1703  TROPONINIHS 7 8   BNP: No results for input(s): BNP in the last 72 hours. D-Dimer: No results for input(s): DDIMER in the last 72 hours. Hemoglobin A1C: No results for input(s): HGBA1C in the last 72 hours. Fasting Lipid Panel: No results for input(s): CHOL, HDL, LDLCALC, TRIG, CHOLHDL, LDLDIRECT in the last 72 hours. Thyroid Function Tests: No results for input(s): TSH, T4TOTAL, T3FREE, THYROIDAB in the last 72 hours.  Invalid input(s): FREET3 Anemia Panel: No results for input(s): VITAMINB12, FOLATE, FERRITIN, TIBC, IRON, RETICCTPCT in the last 72 hours.   Radiology: CT Head Wo Contrast Result Date: 03/25/2024 CLINICAL DATA:  Headache, new onset (Age >= 51y) EXAM: CT HEAD WITHOUT CONTRAST TECHNIQUE: Contiguous axial images were obtained from the base of the skull through the vertex without intravenous contrast. RADIATION DOSE REDUCTION: This exam was performed according to the  departmental dose-optimization program which includes automated exposure control, adjustment of the mA and/or kV according to patient size and/or use of iterative reconstruction technique. COMPARISON:  Head CT 02/18/2024 FINDINGS: Brain: No intracranial hemorrhage, mass effect,  or midline shift. No hydrocephalus. The basilar cisterns are patent. No evidence of territorial infarct or acute ischemia. No extra-axial or intracranial fluid collection. Partially empty sella again seen. Vascular: Atherosclerosis of skullbase vasculature without hyperdense vessel or abnormal calcification. Skull: No fracture or focal lesion. Sinuses/Orbits: Progressive paranasal sinus opacification from prior exam. Fluid levels noted in the left maxillary sinus. Other: None. IMPRESSION: 1. No acute intracranial abnormality. 2. Progressive paranasal sinus opacification from prior exam. Fluid levels in the left maxillary sinus, can be seen with acute sinusitis. Electronically Signed   By: Andrea Gasman M.D.   On: 03/25/2024 17:52    ECHO 10/2023: 1. Left ventricular ejection fraction, by estimation, is 60 to 65%. The left ventricle has normal function. The left ventricle has no regional wall motion abnormalities. There is mild left ventricular hypertrophy. Left ventricular diastolic parameters  are consistent with Grade I diastolic dysfunction (impaired relaxation).   2. Right ventricular systolic function is normal. The right ventricular  size is normal.   3. The mitral valve is normal in structure. Mild mitral valve  regurgitation. No evidence of mitral stenosis.   4. The aortic valve is normal in structure. Aortic valve regurgitation is  not visualized. No aortic stenosis is present.   5. The inferior vena cava is normal in size with greater than 50%  respiratory variability, suggesting right atrial pressure of 3 mmHg.   TELEMETRY reviewed by me 03/27/2024: Sinus rhythm   EKG reviewed by me: NSR rate 78 bpm,  nonischemic  Data reviewed by me 03/27/2024: last 24h vitals tele labs imaging I/O ED provider note, admission H&P, hospitalist progress note  Principal Problem:   Hypertensive urgency Active Problems:   Recurrent syncope   CAD s/p CABG (coronary artery disease)   Atrial fibrillation and flutter (HCC)   Weakness   Hyperlipidemia, unspecified   Type 2 diabetes mellitus with peripheral neuropathy (HCC)   Headache   PSVT (paroxysmal supraventricular tachycardia)   NSVT (nonsustained ventricular tachycardia) (HCC)   PAC (premature atrial contraction)   Obesity (BMI 30-39.9)   Paroxysmal atrial fibrillation (HCC)    ASSESSMENT AND PLAN:  Brandi [name] is a 88 y.o. female  with a past medical history of coronary artery disease s/p CABG x3 2019 (LIMA to LAD, SVG to OM1, SVG to RCA at [facility]), hypertension, hyperlipidemia, obstructive sleep apnea, paroxsymal atrial fibrillation (LAA clipping), and history of syncopal episodes who presented to the ED on 03/25/2024 for syncope. Cardiology was consulted for further evaluation.   # Syncope # Hypertensive urgency # Coronary artery disease # Paroxysmal atrial fibrillation s/p LAA clipping Patient with a history of syncope for multiple years presented after syncopal episode which followed a bowel movement.  BP significantly elevated in the ED with systolics in the 260s.  Started on nicardipine  drip which was stopped overnight.  Troponins normal x 2.  EKG without acute ischemic changes.  Recent echo in May with preserved EF, no wall motion abnormalities, grade 1 diastolic dysfunction. - Increase spironolactone to 50 mg daily. Continue hydralazine  100 mg 3 times daily.  Continue amlodipine  10 mg daily, carvedilol  25 mg daily, losartan  100 mg daily.   - 3-day Holter 12/2023 with sinus rhythm, no evidence of high-grade AV block or pauses.  Also no evidence of significant arrhythmias but does have frequent PACs. - Suspect noncardiac cause of syncopal episode.   Likely would not recommend additional monitoring given recent monitor after syncopal event earlier this year was unremarkable.  This patient's plan  of care was discussed and created with Dr. Ammon and he is in agreement.  Signed: Danita Bloch, PA-C  03/27/2024, 8:17 AM [facility] Cardiology

## 2024-03-27 NOTE — Care Management Obs Status (Signed)
 MEDICARE OBSERVATION STATUS NOTIFICATION   Patient Details  Name: Brandi [name] MRN: [id number] Date of Birth: [birth year]   Medicare Observation Status Notification Given:  Yes    [name] 03/27/2024, 11:36 AM

## 2024-03-27 NOTE — Progress Notes (Signed)
 Patient's BP on admission 189/52. RN made Dr. Josette aware and MD asked for patient's blood pressure to be checked standing. Standing BP 134/63. RN made MD aware and MD stated No extra treatment. Need bp high for when she stands up. No new orders given.

## 2024-03-27 NOTE — Progress Notes (Addendum)
 Mobility Specialist - Progress Note     03/27/24 1302  Mobility  Activity Stood at bedside;Pivoted/transferred from bed to chair;Dangled on edge of bed;Pivoted/transferred from chair to bed  Level of Assistance Minimal assist, patient does 75% or more  Assistive Device None  Distance Ambulated (ft) 3 ft  Range of Motion/Exercises Active  Activity Response Tolerated well  Mobility Referral Yes  Mobility visit 1 Mobility  Mobility Specialist Start Time (ACUTE ONLY) 1231  Mobility Specialist Stop Time (ACUTE ONLY) 1257  Mobility Specialist Time Calculation (min) (ACUTE ONLY) 26 min   Pt resting in bed on RA upon entry. Pt STS and pivots to recliner to sit during bed clean up MinA +2 for safety. Pt returned to bed and left with needs in reach and bed alarm activated.   Guido Rumble Mobility Specialist 03/27/24, 2:20 PM

## 2024-03-27 NOTE — Progress Notes (Signed)
 Progress Note   Patient: Brandi [name] FMW:[id number] DOB: 28-Sep-1935 DOA: 03/25/2024     0 DOS: the patient was seen and examined on 03/27/2024   Brief hospital course: 88 y.o. female with medical history significant of HTN, HLD, DM, CAD, s/p of CABG, A fib not on AC, PSVT, NSVT, PAC, iron deficiency anemia, obesity, OSA not on CPAP, chronic pain, syncope, orthostatic status, who presents with syncope.   Per her son at bedside, pt passed out for few minutes in bathroom after having bowel movement around 11 PM.  Patient does not have unilateral numbness or tingling in extremities.  No facial droop or slurred speech.  She moves all extremities normally.  She complains of headache which is located in right side of and front head, constant, severe, aching, nonradiating, not aggravated or dictated by any known factors.  Her headache has improved after received 15 mg of Toradol  in ED. Denies chest pain, cough, SOB.  Patient has nausea, no vomiting, diarrhea or abdominal pain.  No symptoms UTI.  Patient states that she completed course of prednisone  and amoxicillin  yesterday for sinusitis.  She still has some nasal congestion. No fever or chills.    Of note, pt was admitted 5/8 - 6/2 because of syncope which was likely due to profound orthostatic vital signs and polypharmacy.  Patient had extensive workup, including negative MRI of brain for stroke on 5/28,, CTA for PE on 5/31, negative EEG on 5/28, relatively benign Holter except for frequent multiple PACs on 7/9.  2D echo on 11/14/2023 showed EF 60 to 65% with grade 1 diastolic dysfunction.  Her blood pressure medications was also adjusted.   Patient was found to have severely elevated blood pressure 242/68 --> 267/88 in ED. Patient was given IV 10 mg of labetalol  without any improvement.  Cardene  drip was started in ED.   Data reviewed independently and ED Course: pt was found to have trop 7 --> 8, WBC 6.9, GFR> 60.  Temperature normal, heart rate 79, RR  22, oxygen saturation 100% on room air.  Patient is placed in PCU for observation.   CT of head: 1. No acute intracranial abnormality. 2. Progressive paranasal sinus opacification from prior exam. Fluid levels in the left maxillary sinus, can be seen with acute sinusitis.  10/8.  Blood pressure very variable.  Off of Cardene  drip.  Titrating up oral medications.  Cardiology increased hydralazine  to 100 mg every 8 hours.  I added Norvasc  5 mg daily.  He then had a low blood pressure.  Blood pressure has come up a little bit.  Will put holding parameters on hydralazine  and will decrease to 50 mg every 8 hours.  Watch overnight. 10/9.  With orthostatic vital signs.  Patient's blood pressure dropped down to systolic in the 70s.  Patient did not feel well.  Need to cut back on medications.  Unable to get an a.m. cortisol since patient recently on steroids.    Assessment and Plan: * Orthostatic hypotension Blood pressure dropped down into the 70s with treating high blood pressure.  Need to cut back on meds.  Patient's blood pressure will likely have to be in the 180s at rest.  Need to check blood pressure standing with every set of vitals.  Hypertensive urgency Initially started on Cardene  drip.  Continue Coreg  25 mg twice daily, losartan  daily.  Will lower the dose of Norvasc  and Aldactone.  Discontinue hydralazine .  Blood pressure may have to be in the 180s for when she  stands up so it will drop down below 100.  Recurrent syncope Likely secondary to orthostatic hypotension.  Continue to check orthostatics.  Hyperlipidemia, unspecified On atorvastatin   Type 2 diabetes mellitus with peripheral neuropathy (HCC) Patient on low-dose Lantus  insulin  plus sliding scale  Obesity (BMI 30-39.9) BMI 32.88  Hypokalemia Replace potassium  Paroxysmal atrial fibrillation (HCC) Status post LAA clipping  Weakness Patient stated she only marched in place and did not walk around yesterday.         Subjective: Patient did not feel well with standing up earlier.  Blood pressure dropped down into the 70s.  Fluid bolus ordered.  Physical Exam: Vitals:   03/27/24 0730 03/27/24 0800 03/27/24 0946 03/27/24 1130  BP: (!) 211/66 (!) 204/63  (!) 184/55  Pulse: 80 74  72  Resp: 16 13  16   Temp:   98.2 F (36.8 C)   TempSrc:   Oral   SpO2: 100% 97%  100%  Weight:      Height:       Physical Exam HENT:     Head: Normocephalic.  Eyes:     General: Lids are normal.  Cardiovascular:     Rate and Rhythm: Normal rate and regular rhythm.     Heart sounds: Normal heart sounds, S1 normal and S2 normal.  Pulmonary:     Breath sounds: No decreased breath sounds, wheezing, rhonchi or rales.  Abdominal:     Palpations: Abdomen is soft.     Tenderness: There is no abdominal tenderness.  Musculoskeletal:     Right lower leg: No swelling.     Left lower leg: No swelling.  Skin:    General: Skin is warm.     Findings: No rash.  Neurological:     Mental Status: She is alert and oriented to person, place, and time.     Data Reviewed: Potassium 3.4, creatinine 0.63, CBC normal range  Family Communication: Family at bedside  Disposition: Status is: Observation Patient with severe orthostatic hypotension today.  Cutting back on medications.  Will need to have blood pressure on the higher side  Planned Discharge Destination: Home    Time spent: 28 minutes  Author: Charlie Patterson, MD 03/27/2024 1:26 PM  For on call review [email or web address].

## 2024-03-27 NOTE — Assessment & Plan Note (Signed)
 Replace potassium

## 2024-03-28 ENCOUNTER — Emergency Department

## 2024-03-28 ENCOUNTER — Other Ambulatory Visit: Payer: Self-pay

## 2024-03-28 ENCOUNTER — Encounter: Payer: Self-pay | Admitting: Emergency Medicine

## 2024-03-28 DIAGNOSIS — Z96653 Presence of artificial knee joint, bilateral: Secondary | ICD-10-CM | POA: Insufficient documentation

## 2024-03-28 DIAGNOSIS — E785 Hyperlipidemia, unspecified: Secondary | ICD-10-CM | POA: Diagnosis not present

## 2024-03-28 DIAGNOSIS — R7989 Other specified abnormal findings of blood chemistry: Secondary | ICD-10-CM | POA: Insufficient documentation

## 2024-03-28 DIAGNOSIS — I10 Essential (primary) hypertension: Secondary | ICD-10-CM | POA: Insufficient documentation

## 2024-03-28 DIAGNOSIS — I951 Orthostatic hypotension: Secondary | ICD-10-CM | POA: Insufficient documentation

## 2024-03-28 DIAGNOSIS — I2510 Atherosclerotic heart disease of native coronary artery without angina pectoris: Secondary | ICD-10-CM | POA: Insufficient documentation

## 2024-03-28 DIAGNOSIS — Z951 Presence of aortocoronary bypass graft: Secondary | ICD-10-CM | POA: Insufficient documentation

## 2024-03-28 DIAGNOSIS — Z955 Presence of coronary angioplasty implant and graft: Secondary | ICD-10-CM | POA: Insufficient documentation

## 2024-03-28 DIAGNOSIS — E1140 Type 2 diabetes mellitus with diabetic neuropathy, unspecified: Secondary | ICD-10-CM | POA: Insufficient documentation

## 2024-03-28 DIAGNOSIS — E1142 Type 2 diabetes mellitus with diabetic polyneuropathy: Secondary | ICD-10-CM | POA: Diagnosis not present

## 2024-03-28 DIAGNOSIS — N390 Urinary tract infection, site not specified: Secondary | ICD-10-CM | POA: Insufficient documentation

## 2024-03-28 DIAGNOSIS — I160 Hypertensive urgency: Secondary | ICD-10-CM | POA: Diagnosis not present

## 2024-03-28 DIAGNOSIS — Z79899 Other long term (current) drug therapy: Secondary | ICD-10-CM | POA: Insufficient documentation

## 2024-03-28 DIAGNOSIS — Z794 Long term (current) use of insulin: Secondary | ICD-10-CM | POA: Insufficient documentation

## 2024-03-28 DIAGNOSIS — Z7984 Long term (current) use of oral hypoglycemic drugs: Secondary | ICD-10-CM | POA: Insufficient documentation

## 2024-03-28 DIAGNOSIS — R55 Syncope and collapse: Secondary | ICD-10-CM | POA: Diagnosis not present

## 2024-03-28 DIAGNOSIS — Z7982 Long term (current) use of aspirin: Secondary | ICD-10-CM | POA: Insufficient documentation

## 2024-03-28 LAB — BASIC METABOLIC PANEL WITH GFR
Anion gap: 7 (ref 5–15)
BUN: 8 mg/dL (ref 8–23)
CO2: 26 mmol/L (ref 22–32)
Calcium: 8.5 mg/dL — ABNORMAL LOW (ref 8.9–10.3)
Chloride: 105 mmol/L (ref 98–111)
Creatinine, Ser: 0.81 mg/dL (ref 0.44–1.00)
GFR, Estimated: >60 mL/min (ref >60)
Glucose, Bld: 150 mg/dL — ABNORMAL HIGH (ref 70–99)
Potassium: 3.5 mmol/L (ref 3.5–5.1)
Sodium: 138 mmol/L (ref 135–145)

## 2024-03-28 LAB — CBC
HCT: 40.4 % (ref 36.0–46.0)
Hemoglobin: 12.5 g/dL (ref 12.0–15.0)
MCH: 27.7 pg (ref 26.0–34.0)
MCHC: 30.9 g/dL (ref 30.0–36.0)
MCV: 89.4 fL (ref 80.0–100.0)
Platelets: 179 K/uL (ref 150–400)
RBC: 4.52 MIL/uL (ref 3.87–5.11)
RDW: 14.6 % (ref 11.5–15.5)
WBC: 6.3 K/uL (ref 4.0–10.5)
nRBC: 0 % (ref 0.0–0.2)

## 2024-03-28 LAB — GLUCOSE, CAPILLARY: Glucose-Capillary: 166 mg/dL — ABNORMAL HIGH (ref 70–99)

## 2024-03-28 MED ORDER — AMLODIPINE BESYLATE 5 MG PO TABS: 5.0000 mg | ORAL_TABLET | Freq: Every day | ORAL | 0 refills | Status: AC

## 2024-03-28 MED ORDER — SPIRONOLACTONE 25 MG PO TABS: 25.0000 mg | ORAL_TABLET | Freq: Every day | ORAL | 0 refills | Status: DC

## 2024-03-28 MED ORDER — CARVEDILOL 25 MG PO TABS: 25.0000 mg | ORAL_TABLET | Freq: Two times a day (BID) | ORAL | 0 refills | Status: AC

## 2024-03-28 NOTE — TOC Transition Note (Signed)
 Transition of Care [facility]) - Discharge Note   Patient Details  Name: Brandi [name] MRN: [id number] Date of Birth: Dec 23, 1935  [facility] CM/SW Contact:  Lauraine JAYSON Carpen, LCSW Phone Number: 03/28/2024, 11:03 AM   Clinical Narrative:  Patient has orders to discharge home today. [facility] liaison is aware. No further concerns. CSW signing off.   Final next level of care: Home w Home Health Services Barriers to Discharge: Barriers Resolved   Patient Goals and CMS Choice     Choice offered to / list presented to : Patient, Adult Children      Discharge Placement                Patient to be transferred to facility by: Son   Patient and family notified of of transfer: 03/28/24  Discharge Plan and Services Additional resources added to the After Visit Summary for       Post Acute Care Choice: Home Health                    HH Arranged: PT, OT [facility] Agency: [facility] Date [facility] Agency Contacted: 03/28/24   Representative spoke with at [facility] Agency: [place]  Social Drivers of Health (SDOH) Interventions SDOH Screenings   Food Insecurity: No Food Insecurity (03/27/2024)  Housing: Low Risk  (03/27/2024)  Transportation Needs: No Transportation Needs (03/27/2024)  Utilities: Not At Risk (03/27/2024)  Depression (PHQ2-9): Low Risk  (02/11/2024)  Financial Resource Strain: Low Risk  (02/22/2023)   Received from [facility]  Social Connections: Patient Declined (03/27/2024)  Tobacco Use: Low Risk  (03/25/2024)     Readmission Risk Interventions    11/19/2023   12:35 PM  Readmission Risk Prevention Plan  Transportation Screening Complete  PCP or Specialist Appt within 3-5 Days Complete  HRI or Home Care Consult Complete  Social Work Consult for Recovery Care Planning/Counseling Complete  Palliative Care Screening Not Applicable  Medication Review Oceanographer) Complete

## 2024-03-28 NOTE — Discharge Summary (Signed)
 Physician Discharge Summary   Patient: Brandi [name] MRN: [id number] DOB: 08/30/1935  Admit date:     03/25/2024  Discharge date: 03/28/24  Discharge Physician: Charlie Patterson   PCP: Rudolpho Norleen BIRCH, MD   Recommendations at discharge:   Follow-up PCP 5 days Follow-up cardiology 1 week  Discharge Diagnoses: Principal Problem:   Orthostatic hypotension Active Problems:   Recurrent syncope   Hypertensive urgency   CAD s/p CABG (coronary artery disease)   Hyperlipidemia, unspecified   Type 2 diabetes mellitus with peripheral neuropathy (HCC)   Headache   PAC (premature atrial contraction)   Obesity (BMI 30-39.9)   Weakness   Paroxysmal atrial fibrillation (HCC)   Hypokalemia    Hospital Course: 88 y.o. female with medical history significant of HTN, HLD, DM, CAD, s/p of CABG, A fib not on AC, PSVT, NSVT, PAC, iron deficiency anemia, obesity, OSA not on CPAP, chronic pain, syncope, orthostatic status, who presents with syncope.   Per her son at bedside, pt passed out for few minutes in bathroom after having bowel movement around 11 PM.  Patient does not have unilateral numbness or tingling in extremities.  No facial droop or slurred speech.  She moves all extremities normally.  She complains of headache which is located in right side of and front head, constant, severe, aching, nonradiating, not aggravated or dictated by any known factors.  Her headache has improved after received 15 mg of Toradol  in ED. Denies chest pain, cough, SOB.  Patient has nausea, no vomiting, diarrhea or abdominal pain.  No symptoms UTI.  Patient states that she completed course of prednisone  and amoxicillin  yesterday for sinusitis.  She still has some nasal congestion. No fever or chills.    Of note, pt was admitted 5/8 - 6/2 because of syncope which was likely due to profound orthostatic vital signs and polypharmacy.  Patient had extensive workup, including negative MRI of brain for stroke on 5/28,, CTA  for PE on 5/31, negative EEG on 5/28, relatively benign Holter except for frequent multiple PACs on 7/9.  2D echo on 11/14/2023 showed EF 60 to 65% with grade 1 diastolic dysfunction.  Her blood pressure medications was also adjusted.   Patient was found to have severely elevated blood pressure 242/68 --> 267/88 in ED. Patient was given IV 10 mg of labetalol  without any improvement.  Cardene  drip was started in ED.   Data reviewed independently and ED Course: pt was found to have trop 7 --> 8, WBC 6.9, GFR> 60.  Temperature normal, heart rate 79, RR 22, oxygen saturation 100% on room air.  Patient is placed in PCU for observation.   CT of head: 1. No acute intracranial abnormality. 2. Progressive paranasal sinus opacification from prior exam. Fluid levels in the left maxillary sinus, can be seen with acute sinusitis.  10/8.  Blood pressure very variable.  Off of Cardene  drip.  Titrating up oral medications.  Cardiology increased hydralazine  to 100 mg every 8 hours.  I added Norvasc  5 mg daily.  He then had a low blood pressure.  Blood pressure has come up a little bit.  Will put holding parameters on hydralazine  and will decrease to 50 mg every 8 hours.  Watch overnight. 10/9.  With orthostatic vital signs.  Patient's blood pressure dropped down to systolic in the 70s.  Patient did not feel well.  Need to cut back on medications.  Unable to get an a.m. cortisol since patient recently on steroids. 10/10.  Patient with  severe orthostatic hypotension.  Patient ambulated in hallway with nursing staff and felt okay.  Patient advised to go from a lying position to a sitting position and stay there for 5 minutes.  Then stand before walking away for a few minutes.  TED hose.  Must check standing blood pressure prior to adjusting blood pressure medications.   Assessment and Plan: * Orthostatic hypotension Blood pressure dropped down into the 70s while standing with treating high blood pressure on 10/9.   Needed to cut back on meds.  Patient's blood pressure will likely have to be in the 180s at rest.  Need to check blood pressure standing prior to adjusting any blood pressure medications.  Since the patient was recently on steroids I do recommend checking a a.m. cortisol in about 6 to 8 weeks.  Hypertensive urgency Initially started on Cardene  drip.  Continue Coreg  25 mg twice daily, losartan  daily.  Continue Norvasc  and Aldactone.  Discontinue hydralazine .  Blood pressure may have to be in the 180s,  so when it drops it will go below 100.  Recurrent syncope Likely secondary to orthostatic hypotension.  Continue to check orthostatics even at home.  Hyperlipidemia, unspecified On atorvastatin   Type 2 diabetes mellitus with peripheral neuropathy (HCC) Patient on low-dose Lantus  insulin  plus sliding scale  Obesity (BMI 30-39.9) BMI 32.88  Hypokalemia Replace potassium  Paroxysmal atrial fibrillation (HCC) Status post LAA clipping.  Heart rate while ambulating with nursing staff was 110.  Weakness Patient well with nursing staff in the hallway and felt okay.         Consultants: Cardiology Procedures performed: None Disposition: Home health Diet recommendation:  Cardiac diet DISCHARGE MEDICATION: Allergies as of 03/28/2024       Reactions   Accupril [quinapril Hcl] Rash   Micardis [telmisartan] Other (See Comments)   Dizziness   Quinapril Rash        Medication List     STOP taking these medications    glipiZIDE  5 MG 24 hr tablet Commonly known as: GLUCOTROL  XL   hydrALAZINE  10 MG tablet Commonly known as: APRESOLINE        TAKE these medications    acetaminophen  500 MG tablet Commonly known as: TYLENOL  Take 1,000 mg by mouth 3 (three) times daily.   amLODipine  5 MG tablet Commonly known as: NORVASC  Take 1 tablet (5 mg total) by mouth daily. Start taking on: March 29, 2024   aspirin  EC 81 MG tablet Take 81 mg by mouth daily.   atorvastatin  40  MG tablet Commonly known as: LIPITOR Take 40 mg by mouth every evening.   budesonide-formoterol  80-4.5 MCG/ACT inhaler Commonly known as: SYMBICORT Inhale 2 puffs into the lungs 2 (two) times daily.   carboxymethylcellulose 0.5 % Soln Commonly known as: REFRESH PLUS Apply 1-2 drops to eye at bedtime.   carvedilol  25 MG tablet Commonly known as: COREG  Take 1 tablet (25 mg total) by mouth 2 (two) times daily with a meal.   ferrous sulfate  325 (65 FE) MG tablet Take 325 mg by mouth daily with breakfast.   furosemide 20 MG tablet Commonly known as: LASIX Take 20 mg by mouth every other day.   gabapentin  400 MG capsule Commonly known as: NEURONTIN  Take 400 mg by mouth 3 (three) times daily.   ipratropium 0.06 % nasal spray Commonly known as: ATROVENT Place 2 sprays into the nose 3 (three) times daily as needed for rhinitis.   Janumet  XR 50-1000 MG Tb24 Generic drug: SitaGLIPtin -MetFORMIN  HCl Take 1 tablet  by mouth 2 (two) times daily.   Lantus  SoloStar 100 UNIT/ML Solostar Pen Generic drug: insulin  glargine Inject 5 Units into the skin every morning.   losartan  100 MG tablet Commonly known as: COZAAR  Take 100 mg by mouth at bedtime.   Melatonin 10 MG Tabs Take 1 tablet by mouth at bedtime.   mirabegron  ER 50 MG Tb24 tablet Commonly known as: MYRBETRIQ  Take 1 tablet (50 mg total) by mouth daily.   nitroGLYCERIN  0.4 MG SL tablet Commonly known as: NITROSTAT  Place 0.4 mg under the tongue every 5 (five) minutes as needed for chest pain.   PreserVision/Lutein  Caps Take 1 capsule by mouth 2 (two) times daily.   progesterone  100 MG capsule Commonly known as: PROMETRIUM  Take 100 mg by mouth at bedtime.   sodium chloride  0.65 % nasal spray Commonly known as: OCEAN Place 1 spray into the nose as needed.   spironolactone 25 MG tablet Commonly known as: ALDACTONE Take 1 tablet (25 mg total) by mouth daily. Start taking on: March 29, 2024   VITAMIN B-12 PO Take  1 tablet by mouth daily.        Contact information for follow-up providers     Florencio Cara BIRCH, MD. Go on 04/02/2024.   Specialties: Cardiology, Internal Medicine Why: Hospital Follow-Up: October 15th @ 3PM Contact information: [address] [place] [address] [phone]         Rudolpho Norleen BIRCH, MD Follow up in 5 day(s).   Specialty: Internal Medicine Why: Hospital Follow-Up: please follow up after discharge. Contact information: [address] [facility] [place] [address] [phone]              Contact information for after-discharge care     Home Medical Care     [facility]) .   Service: Home Health Services Contact information: [address] [place]  [address] [phone]                    Discharge Exam: Fredricka Weights   03/25/24 1353  Weight: 78.9 kg   Physical Exam HENT:     Head: Normocephalic.  Eyes:     General: Lids are normal.  Cardiovascular:     Rate and Rhythm: Normal rate and regular rhythm.     Heart sounds: Normal heart sounds, S1 normal and S2 normal.  Pulmonary:     Breath sounds: No decreased breath sounds, wheezing, rhonchi or rales.  Abdominal:     Palpations: Abdomen is soft.     Tenderness: There is no abdominal tenderness.  Musculoskeletal:     Right lower leg: No swelling.     Left lower leg: No swelling.  Skin:    General: Skin is warm.     Findings: No rash.  Neurological:     Mental Status: She is alert and oriented to person, place, and time.      Condition at discharge: stable  The results of significant diagnostics from this hospitalization (including imaging, microbiology, ancillary and laboratory) are listed below for reference.   Imaging Studies: CT Head Wo Contrast Result Date: 03/25/2024 CLINICAL DATA:  Headache, new onset (Age >= 51y) EXAM: CT HEAD WITHOUT CONTRAST TECHNIQUE: Contiguous axial images  were obtained from the base of the skull through the vertex without intravenous contrast. RADIATION DOSE REDUCTION: This exam was performed according to the departmental dose-optimization program which includes automated exposure control, adjustment of the mA and/or  kV according to patient size and/or use of iterative reconstruction technique. COMPARISON:  Head CT 02/18/2024 FINDINGS: Brain: No intracranial hemorrhage, mass effect, or midline shift. No hydrocephalus. The basilar cisterns are patent. No evidence of territorial infarct or acute ischemia. No extra-axial or intracranial fluid collection. Partially empty sella again seen. Vascular: Atherosclerosis of skullbase vasculature without hyperdense vessel or abnormal calcification. Skull: No fracture or focal lesion. Sinuses/Orbits: Progressive paranasal sinus opacification from prior exam. Fluid levels noted in the left maxillary sinus. Other: None. IMPRESSION: 1. No acute intracranial abnormality. 2. Progressive paranasal sinus opacification from prior exam. Fluid levels in the left maxillary sinus, can be seen with acute sinusitis. Electronically Signed   By: Andrea Gasman M.D.   On: 03/25/2024 17:52       Labs: CBC: Recent Labs  Lab 03/25/24 1355 03/27/24 0519  WBC 6.9 9.0  HGB 12.6 12.6  HCT 38.9 38.8  MCV 87.0 86.8  PLT 179 174   Basic Metabolic Panel: Recent Labs  Lab 03/25/24 1355 03/27/24 0519 03/28/24 0250  NA 136 138 138  K 3.8 3.4* 3.5  CL 98 106 105  CO2 27 25 26   GLUCOSE 269* 146* 150*  BUN 12 9 8   CREATININE 0.87 0.63 0.81  CALCIUM  8.3* 8.1* 8.5*   Liver Function Tests: No results for input(s): AST, ALT, ALKPHOS, BILITOT, PROT, ALBUMIN in the last 168 hours. CBG: Recent Labs  Lab 03/27/24 0816 03/27/24 1205 03/27/24 1606 03/27/24 2114 03/28/24 0838  GLUCAP 171* 191* 169* 150* 166*    Discharge time spent: greater than 30 minutes.  Signed: Charlie Patterson, MD [facility] 03/28/2024

## 2024-03-28 NOTE — Progress Notes (Signed)
 Physical Therapy Treatment Patient Details Name: Brandi [name] MRN: [id number] DOB: [birth year] Today's Date: 03/28/2024   History of Present Illness 88 y.o. female with medical history significant of HTN, HLD, DM, CAD, s/p of CABG, A fib not on AC, PSVT, NSVT, PAC, iron deficiency anemia, obesity, OSA not on CPAP, chronic pain, syncope, orthostatic status, who presents with syncope.    PT Comments  Pt is progressing with mobility performing at an overall supervision level with increased gait distance (150' x2 with rollator), stair negotiation, and functional  standing balance with intermittent UE support.   Pt will benefit from continued PT services upon discharge to progress with dynamic standing balance and activity tolerance to decreased caregiver assistance and eventual return to PLOF.     If plan is discharge home, recommend the following: A little help with walking and/or transfers;A little help with bathing/dressing/bathroom;Assistance with cooking/housework;Assist for transportation;Help with stairs or ramp for entrance   Can travel by private vehicle        Equipment Recommendations  Other (comment) (pt has rollator)    Recommendations for Other Services       Precautions / Restrictions Precautions Precautions: Fall Recall of Precautions/Restrictions: Intact Restrictions Weight Bearing Restrictions Per Provider Order: No     Mobility  Bed Mobility               General bed mobility comments: NT, pt up in recliner pre/post PT session    Transfers Overall transfer level: Needs assistance Equipment used: Rollator (4 wheels) Transfers: Sit to/from Stand, Bed to chair/wheelchair/BSC Sit to Stand: Supervision   Step pivot transfers: Supervision       General transfer comment: demonstrates safe technique with rollator.    Ambulation/Gait Ambulation/Gait assistance: Supervision Gait Distance (Feet): 150 Feet (x2) Assistive device: Rollator (4 wheels) Gait  Pattern/deviations: Step-through pattern, Decreased stride length, Trunk flexed Gait velocity: decreased     General Gait Details: x1 seated rest on rollator seat during return trip from stair negotiation.   Stairs Stairs: Yes Stairs assistance: Supervision Stair Management: Step to pattern, Sideways, One rail Right Number of Stairs: 6 General stair comments: performed with stability   Wheelchair Mobility     Tilt Bed    Modified Rankin (Stroke Patients Only)       Balance Overall balance assessment: Needs assistance Sitting-balance support: Feet unsupported Sitting balance-Leahy Scale: Normal Sitting balance - Comments: no UE support needed   Standing balance support: No upper extremity supported, Single extremity supported, During functional activity Standing balance-Leahy Scale: Good Standing balance comment: intermittent to no UE support with funcitonal standing at supervision level.                            Communication Communication Communication: No apparent difficulties  Cognition Arousal: Alert Behavior During Therapy: WFL for tasks assessed/performed   PT - Cognitive impairments: No apparent impairments                         Following commands: Intact      Cueing Cueing Techniques: Verbal cues  Exercises      General Comments        Pertinent Vitals/Pain Pain Assessment Pain Score: 2  Pain Location: L shoulder Pain Descriptors / Indicators: Aching Pain Intervention(s): Monitored during session    Home Living  Prior Function            PT Goals (current goals can now be found in the care plan section) Acute Rehab PT Goals Patient Stated Goal: to go home PT Goal Formulation: With patient Time For Goal Achievement: 04/09/24 Potential to Achieve Goals: Good Progress towards PT goals: Progressing toward goals    Frequency    Min 2X/week      PT Plan       Co-evaluation              AM-PAC PT 6 Clicks Mobility   Outcome Measure  Help needed turning from your back to your side while in a flat bed without using bedrails?: None Help needed moving from lying on your back to sitting on the side of a flat bed without using bedrails?: A Little Help needed moving to and from a bed to a chair (including a wheelchair)?: A Little Help needed standing up from a chair using your arms (e.g., wheelchair or bedside chair)?: A Little Help needed to walk in hospital room?: A Little Help needed climbing 3-5 steps with a railing? : A Little 6 Click Score: 19    End of Session Equipment Utilized During Treatment: Gait belt Activity Tolerance: Patient limited by fatigue Patient left: in chair;with family/visitor present Nurse Communication: Mobility status PT Visit Diagnosis: Unsteadiness on feet (R26.81);Muscle weakness (generalized) (M62.81);Difficulty in walking, not elsewhere classified (R26.2)     Time: 8961-8948 PT Time Calculation (min) (ACUTE ONLY): 13 min  Charges:    $Gait Training: 8-22 mins PT General Charges $$ ACUTE PT VISIT: 1 Visit                     Harland Irving, PTA  03/28/24, 11:15 AM

## 2024-03-28 NOTE — ED Triage Notes (Signed)
 Pt BIB [facility] Pt had syncopal episode  did not hit head. Pt'[s son found pt on the floor and did 2 compressions. Pt is awake, alert and oriented.  Pt was discharged today with orthostatic hypotension was told to start new medication tomorrow 157/78, 99% RA, 71 HF

## 2024-03-28 NOTE — Progress Notes (Signed)
 [facility] CLINIC CARDIOLOGY PROGRESS NOTE       Patient ID: Brandi [name] MRN: [id number] DOB/AGE: 86-Jan-1937 86 y.o.  Admit date: 03/25/2024 Referring Physician Dr. Caleb Exon Primary Physician Rudolpho Norleen BIRCH, MD  Primary Cardiologist Dr. Florencio Reason for Consultation syncope  HPI: Brandi [name] is a 88 y.o. female  with a past medical history of coronary artery disease s/p CABG x3 2019 (LIMA to LAD, SVG to OM1, SVG to RCA at [facility]), hypertension, hyperlipidemia, obstructive sleep apnea, paroxsymal atrial fibrillation (LAA clipping), and history of syncopal episodes who presented to the ED on 03/25/2024 for syncope. Cardiology was consulted for further evaluation.   Interval history: -Patient seen and examined this AM, resting in hospital bed.  -Reports some back pain, denies headache. No cardiac complaints.  -BP overall better earlier this a.m.  Orthostatics yesterday positive for orthostatic hypotension, BP medications minimized.  She is without any dizziness or lightheadedness this morning.  Review of systems complete and found to be negative unless listed above    Past Medical History:  Diagnosis Date   Acquired cyst of kidney    Anemia    Anxiety    Aortic atherosclerosis    Arthritis, degenerative    Atrial fibrillation and flutter (HCC)    a.) CHA2DS2VASc = 6 (age x 2, sex, HTN, prior MI, T2DM);  b.) s/p LAA closure duirng CABG 05/29/2018; c.) rate/rhythm maintained on oral carvedilol  + verapamil ; no chronic anticoagulation s/p LAA closure   Baden-Walker grade 3 cystocele    Chronic cystitis    Coronary artery disease 08/05/2010   a.) LHC/PCI 08/05/2010: 10% oLM, 70% pLAD-1 (3.0 x 23 mm Xience V DES), 75% pLAD-2, 40% mLAD, 50% dLAD; b.) LHC 07/03/2011: 10% oLM, 10% ISR pLAD, 50% mLAD, LCx with min luminal irregs - med mgmt; c.) LHC 05/22/2018: 80% mLCx, 70% OM3, 90% oLAD, 60% pLAD, 60% ost 1st sep, 90% p-mRCA, 40% o-pLAD --> CVTS; d.) s/p 3v CABG 05/29/2018   Depression     Diabetic neuropathy (HCC)    Dyspnea    GERD (gastroesophageal reflux disease)    Headache    History of left atrial appendage closure 05/29/2018   HLD (hyperlipidemia)    HTN (hypertension)    Implantable loop recorder present 09/20/2016   a.) has reached EOL; no longer funcitoning/being interrogated   Insomnia    a.) uses melatonin PRN   Murmur    NSTEMI (non-ST elevated myocardial infarction) (HCC) 05/20/2018   a.) troponins were trended: 0.30 --> 0.25 --> 0.22 --> 0.24 ng/L; b.) LHC 05/22/2018: 80% mLCx, 70% OM3, 90% oLAD, 60% pLAD, 60% ost 1st sep, 90% p-mRCA, 40% o-pLAD --> CVTS consult; c.) 3v CABG 05/29/2018: LIMA-LAD, SVG-OM1, SVG-RCA   NSVT (nonsustained ventricular tachycardia) (HCC) 01/2022   a.) holter 01/2022: rates as fast as 195 bpm lasting up to 12 beats   Obesity    OSA on CPAP    PONV (postoperative nausea and vomiting)    Presence of drug coated stent in LAD coronary artery    PSVT (paroxysmal supraventricular tachycardia) 01/2022   a.) holter 01/2022: rates as fast as 195 bpm lasting up to 12 beats   S/P CABG x 3 05/29/2018   a.) 3v CABG at [facility] 05/29/2018: LIMA-LAD, SVG-OM1, SVG-RCA   Sepsis (HCC)    Spinal stenosis    Syncopal episodes 10/2022   Syncope    T2DM (type 2 diabetes mellitus) (HCC)    Unstable angina (HCC)    Urge incontinence of urine  Past Surgical History:  Procedure Laterality Date   BREAST BIOPSY Right 2004   neg   BUNIONECTOMY     CHOLECYSTECTOMY     COLONOSCOPY WITH PROPOFOL  N/A 06/14/2017   Procedure: COLONOSCOPY WITH PROPOFOL ;  Surgeon: Gaylyn Gladis PENNER, MD;  Location: [facility] ENDOSCOPY;  Service: Endoscopy;  Laterality: N/A;   CORONARY ANGIOPLASTY WITH STENT PLACEMENT Left 08/05/2010   Procedure: CORONARY ANGIOPLASTY WITH STENT PLACEMENT; Location: [facility]; Surgeon(s): Vinie Jude, MD (diagnostic) and Margie Lovelace, MD (interventional)   CORONARY ARTERY BYPASS GRAFT N/A 05/29/2018   Procedure: CORONARY ARTERY BYPASS GRAFT;  Location: [facility]; Surgeon: Bernardino Lares, MD   HYSTEROSCOPY WITH D & C N/A 12/14/2022   Procedure: FRACTIONAL DILATATION AND CURETTAGE /HYSTEROSCOPY;  Surgeon: Lovetta Debby PARAS, MD;  Location: [facility] ORS;  Service: Gynecology;  Laterality: N/A;   LEFT ATRIAL APPENDAGE OCCLUSION Left 05/29/2018   Procedure: LEFT ATRIAL APPENDAGE CLOSURE; Location: [facility]; Surgeon: Bernardino Lares, MD   LEFT HEART CATH AND CORONARY ANGIOGRAPHY N/A 05/22/2018   Procedure: LEFT HEART CATH AND CORONARY ANGIOGRAPHY;  Surgeon: Hester Wolm PARAS, MD;  Location: [facility] INVASIVE CV LAB;  Service: Cardiovascular;  Laterality: N/A;   LEFT HEART CATH AND CORONARY ANGIOGRAPHY Left 07/03/2011   Procedure: LEFT HEART CATH AND CORONARY ANGIOGRAPHY; Location: [facility]; Surgeon: Vinie Jude, MD   LEFT HEART CATH AND CORONARY ANGIOGRAPHY Left 05/22/2018   Procedure: LEFT HEART CATH AND CORONARY ANGIOGRAPHY; Location: [facility]; Surgeon: Vinie Jude, MD   LOOP RECORDER INSERTION N/A 09/20/2016   Procedure: Loop Recorder Insertion;  Surgeon: Marsa Dooms, MD;  Location: [facility] INVASIVE CV LAB;  Service: Cardiovascular;  Laterality: N/A;   SEPTOPLASTY     TOTAL KNEE ARTHROPLASTY Bilateral    XI ROBOTIC ASSISTED INGUINAL HERNIA REPAIR WITH MESH Left 10/14/2020   Procedure: XI ROBOTIC ASSISTED INGUINAL HERNIA REPAIR WITH MESH;  Surgeon: Lane Shope, MD;  Location: [facility] ORS;  Service: General;  Laterality: Left;    Medications Prior to Admission  Medication Sig Dispense Refill Last Dose/Taking   acetaminophen  (TYLENOL ) 500 MG tablet Take 1,000 mg by mouth 3 (three) times daily.   03/25/2024   aspirin  81 MG EC tablet Take 81 mg by mouth daily.    03/25/2024   atorvastatin  (LIPITOR) 40 MG tablet Take 40 mg by mouth every evening.   03/24/2024   budesonide-formoterol  (SYMBICORT) 80-4.5 MCG/ACT inhaler Inhale 2 puffs into the lungs 2 (two) times daily.   03/25/2024   carboxymethylcellulose (REFRESH PLUS) 0.5 % SOLN Apply 1-2 drops to eye at  bedtime.    03/24/2024   carvedilol  (COREG ) 25 MG tablet Take 1 tablet (25 mg total) by mouth 2 (two) times daily with a meal. 60 tablet 0 03/25/2024 Morning   Cyanocobalamin  (VITAMIN B-12 PO) Take 1 tablet by mouth daily.   03/25/2024   ferrous sulfate  325 (65 FE) MG tablet Take 325 mg by mouth daily with breakfast.   03/25/2024   furosemide (LASIX) 20 MG tablet Take 20 mg by mouth every other day.   03/24/2024   gabapentin  (NEURONTIN ) 400 MG capsule Take 400 mg by mouth 3 (three) times daily.   03/25/2024   glipiZIDE  (GLUCOTROL  XL) 5 MG 24 hr tablet Take 5 mg by mouth daily with breakfast.   03/25/2024   hydrALAZINE  (APRESOLINE ) 10 MG tablet Take 10 mg by mouth 3 (three) times daily.   03/25/2024   insulin  glargine (LANTUS  SOLOSTAR) 100 UNIT/ML Solostar Pen Inject 5 Units into the skin every morning.   03/25/2024   ipratropium (ATROVENT) 0.06 %  nasal spray Place 2 sprays into the nose 3 (three) times daily as needed for rhinitis.   Unknown   JANUMET  XR 50-1000 MG TB24 Take 1 tablet by mouth 2 (two) times daily.   03/25/2024   losartan  (COZAAR ) 100 MG tablet Take 100 mg by mouth at bedtime.   03/25/2024   Melatonin 10 MG TABS Take 1 tablet by mouth at bedtime.   03/24/2024 Bedtime   mirabegron  ER (MYRBETRIQ ) 50 MG TB24 tablet Take 1 tablet (50 mg total) by mouth daily. 90 tablet 3 03/25/2024 Morning   Multiple Vitamins-Minerals (PRESERVISION/LUTEIN ) CAPS Take 1 capsule by mouth 2 (two) times daily. 60 capsule 1 03/25/2024   nitroGLYCERIN  (NITROSTAT ) 0.4 MG SL tablet Place 0.4 mg under the tongue every 5 (five) minutes as needed for chest pain.   Unknown   progesterone  (PROMETRIUM ) 100 MG capsule Take 100 mg by mouth at bedtime.   03/24/2024 Bedtime   sodium chloride  (OCEAN) 0.65 % nasal spray Place 1 spray into the nose as needed. (Patient not taking: Reported on 03/25/2024)   Not Taking   Social History   Socioeconomic History   Marital status: Widowed    Spouse name: Not on file   Number of children: Not  on file   Years of education: Not on file   Highest education level: Not on file  Occupational History   Not on file  Tobacco Use   Smoking status: Never   Smokeless tobacco: Never  Vaping Use   Vaping status: Never Used  Substance and Sexual Activity   Alcohol  use: No   Drug use: No   Sexual activity: Not Currently  Other Topics Concern   Not on file  Social History Narrative   Not on file   Social Drivers of Health   Financial Resource Strain: Low Risk  (02/22/2023)   Received from [facility]   Overall Financial Resource Strain (CARDIA)    Difficulty of Paying Living Expenses: Not hard at all  Food Insecurity: No Food Insecurity (03/27/2024)   Hunger Vital Sign    Worried About Running Out of Food in the Last Year: Never true    Ran Out of Food in the Last Year: Never true  Transportation Needs: No Transportation Needs (03/27/2024)   PRAPARE - Administrator, Civil Service (Medical): No    Lack of Transportation (Non-Medical): No  Physical Activity: Not on file  Stress: Not on file  Social Connections: Patient Declined (03/27/2024)   Social Connection and Isolation Panel    Frequency of Communication with Friends and Family: Patient declined    Frequency of Social Gatherings with Friends and Family: Patient declined    Attends Religious Services: Patient declined    Database administrator or Organizations: Patient declined    Attends Banker Meetings: Patient declined    Marital Status: Patient declined  Intimate Partner Violence: Not At Risk (03/27/2024)   Humiliation, Afraid, Rape, and Kick questionnaire    Fear of Current or Ex-Partner: No    Emotionally Abused: No    Physically Abused: No    Sexually Abused: No    Family History  Problem Relation Age of Onset   Breast cancer Cousin    Skin cancer Other    Diabetes Other    Hypercholesterolemia Other    Hypertension Other    Bladder Cancer Maternal Aunt    COPD Other     Kidney disease Neg Hx    Kidney cancer  Neg Hx      Vitals:   03/27/24 2334 03/27/24 2337 03/28/24 0421 03/28/24 0424  BP: (!) 166/79 (!) 84/38 (!) 224/72 139/70  Pulse: 88 99 72 (!) 102  Resp: 20  20   Temp: 97.8 F (36.6 C)  98 F (36.7 C)   TempSrc:      SpO2: 95%  97% 99%  Weight:      Height:        PHYSICAL EXAM General: Appearing elderly female, well nourished, in no acute distress. HEENT: Normocephalic and atraumatic. Neck: No JVD.  Lungs: Normal respiratory effort on room air. Clear bilaterally to auscultation. No wheezes, crackles, rhonchi.  Heart: HRRR. Normal S1 and S2 without gallops or murmurs.  Abdomen: Non-distended appearing.  Msk: Normal strength and tone for age. Extremities: Warm and well perfused. No clubbing, cyanosis.  No edema.  Neuro: Alert and oriented X 3. Psych: Answers questions appropriately.   Labs: Basic Metabolic Panel: Recent Labs    03/27/24 0519 03/28/24 0250  NA 138 138  K 3.4* 3.5  CL 106 105  CO2 25 26  GLUCOSE 146* 150*  BUN 9 8  CREATININE 0.63 0.81  CALCIUM  8.1* 8.5*   Liver Function Tests: No results for input(s): AST, ALT, ALKPHOS, BILITOT, PROT, ALBUMIN in the last 72 hours. No results for input(s): LIPASE, AMYLASE in the last 72 hours. CBC: Recent Labs    03/25/24 1355 03/27/24 0519  WBC 6.9 9.0  HGB 12.6 12.6  HCT 38.9 38.8  MCV 87.0 86.8  PLT 179 174   Cardiac Enzymes: Recent Labs    03/25/24 1355 03/25/24 1703  TROPONINIHS 7 8   BNP: No results for input(s): BNP in the last 72 hours. D-Dimer: No results for input(s): DDIMER in the last 72 hours. Hemoglobin A1C: No results for input(s): HGBA1C in the last 72 hours. Fasting Lipid Panel: No results for input(s): CHOL, HDL, LDLCALC, TRIG, CHOLHDL, LDLDIRECT in the last 72 hours. Thyroid Function Tests: No results for input(s): TSH, T4TOTAL, T3FREE, THYROIDAB in the last 72 hours.  Invalid input(s):  FREET3 Anemia Panel: No results for input(s): VITAMINB12, FOLATE, FERRITIN, TIBC, IRON, RETICCTPCT in the last 72 hours.   Radiology: CT Head Wo Contrast Result Date: 03/25/2024 CLINICAL DATA:  Headache, new onset (Age >= 51y) EXAM: CT HEAD WITHOUT CONTRAST TECHNIQUE: Contiguous axial images were obtained from the base of the skull through the vertex without intravenous contrast. RADIATION DOSE REDUCTION: This exam was performed according to the departmental dose-optimization program which includes automated exposure control, adjustment of the mA and/or kV according to patient size and/or use of iterative reconstruction technique. COMPARISON:  Head CT 02/18/2024 FINDINGS: Brain: No intracranial hemorrhage, mass effect, or midline shift. No hydrocephalus. The basilar cisterns are patent. No evidence of territorial infarct or acute ischemia. No extra-axial or intracranial fluid collection. Partially empty sella again seen. Vascular: Atherosclerosis of skullbase vasculature without hyperdense vessel or abnormal calcification. Skull: No fracture or focal lesion. Sinuses/Orbits: Progressive paranasal sinus opacification from prior exam. Fluid levels noted in the left maxillary sinus. Other: None. IMPRESSION: 1. No acute intracranial abnormality. 2. Progressive paranasal sinus opacification from prior exam. Fluid levels in the left maxillary sinus, can be seen with acute sinusitis. Electronically Signed   By: Andrea Gasman M.D.   On: 03/25/2024 17:52    ECHO 10/2023: 1. Left ventricular ejection fraction, by estimation, is 60 to 65%. The left ventricle has normal function. The left ventricle has no regional wall motion abnormalities.  There is mild left ventricular hypertrophy. Left ventricular diastolic parameters  are consistent with Grade I diastolic dysfunction (impaired relaxation).   2. Right ventricular systolic function is normal. The right ventricular  size is normal.   3. The mitral  valve is normal in structure. Mild mitral valve  regurgitation. No evidence of mitral stenosis.   4. The aortic valve is normal in structure. Aortic valve regurgitation is  not visualized. No aortic stenosis is present.   5. The inferior vena cava is normal in size with greater than 50%  respiratory variability, suggesting right atrial pressure of 3 mmHg.   TELEMETRY reviewed by me 03/28/2024: Sinus rhythm rate 70s  EKG reviewed by me: NSR rate 78 bpm, nonischemic  Data reviewed by me 03/28/2024: last 24h vitals tele labs imaging I/O ED provider note, admission H&P, hospitalist progress note  Principal Problem:   Orthostatic hypotension Active Problems:   Recurrent syncope   CAD s/p CABG (coronary artery disease)   Atrial fibrillation and flutter (HCC)   Weakness   Hypertensive urgency   Hyperlipidemia, unspecified   Type 2 diabetes mellitus with peripheral neuropathy (HCC)   Headache   PSVT (paroxysmal supraventricular tachycardia)   NSVT (nonsustained ventricular tachycardia) (HCC)   PAC (premature atrial contraction)   Obesity (BMI 30-39.9)   Paroxysmal atrial fibrillation (HCC)   Hypokalemia    ASSESSMENT AND PLAN:  Brandi [name] is a 88 y.o. female  with a past medical history of coronary artery disease s/p CABG x3 2019 (LIMA to LAD, SVG to OM1, SVG to RCA at [facility]), hypertension, hyperlipidemia, obstructive sleep apnea, paroxsymal atrial fibrillation (LAA clipping), and history of syncopal episodes who presented to the ED on 03/25/2024 for syncope. Cardiology was consulted for further evaluation.   # Syncope # Hypertensive urgency # Orthostatic hypotension # Coronary artery disease # Paroxysmal atrial fibrillation s/p LAA clipping Patient with a history of syncope for multiple years presented after syncopal episode which followed a bowel movement.  BP significantly elevated in the ED with systolics in the 260s.  Started on nicardipine  drip which was stopped overnight.   Troponins normal x 2.  EKG without acute ischemic changes.  Recent echo in May with preserved EF, no wall motion abnormalities, grade 1 diastolic dysfunction. - Continue amlodipine  5 mg daily, carvedilol  25 mg daily, losartan  100 mg daily, spironolactone 12.5 mg daily.   - 3-day Holter 12/2023 with sinus rhythm, no evidence of high-grade AV block or pauses.  Also no evidence of significant arrhythmias but does have frequent PACs. - Suspect orthostatic hypotension as cause of syncopal episode.  Likely would not recommend additional monitoring given recent monitor after syncopal event earlier this year was unremarkable.  This patient's plan of care was discussed and created with Dr. Ammon and he is in agreement.  Signed: Danita Bloch, PA-C  03/28/2024, 8:24 AM [facility] Cardiology

## 2024-03-28 NOTE — ED Triage Notes (Addendum)
 Pt arrives via ems from home. Pt reports syncopal episode tonight while getting ready for bed, denies hitting head, dizziness, sob, or pain anywhere but reports nausea. NADN.

## 2024-03-28 NOTE — Discharge Instructions (Addendum)
 Check orthostatic Blood pressure reading before adjusting medications  Check your blood pressure at home at rest and standing and bring that log to medical and cardiology appointments  Rec checking and am cortisol level in 6-8 weeks  Sit at edge of bed for five minutes before getting up,  stand in place for a few minutes before walking around

## 2024-03-28 NOTE — Plan of Care (Signed)

## 2024-03-28 NOTE — Progress Notes (Signed)
 Occupational Therapy Treatment Patient Details Name: Brandi [name] MRN: [id number] DOB: 1935/11/28 Today's Date: 03/28/2024   History of present illness 88 y.o. female with medical history significant of HTN, HLD, DM, CAD, s/p of CABG, A fib not on AC, PSVT, NSVT, PAC, iron deficiency anemia, obesity, OSA not on CPAP, chronic pain, syncope, orthostatic status, who presents with syncope.   OT comments  Pt is supine in bed on arrival. Pleasant and agreeable to OT session. She denies pain. Pt performed bed mobility with supervision from lowest supine position to simulate home environment. She reports very minimal dizziness with initial transition to EOB, but resolves quickly. Max A to donn bil socks which husband assists her with at home. Supervision for STS from EOB to RW and step pivot to recliner. Pt ready to eat breakfast, therefore session ended. She was left in recliner with all needs in place and will cont to require skilled acute OT services to maximize her safety and IND to return to PLOF.       If plan is discharge home, recommend the following:  A lot of help with bathing/dressing/bathroom;A little help with walking and/or transfers   Equipment Recommendations  None recommended by OT    Recommendations for Other Services      Precautions / Restrictions Precautions Precautions: Fall Recall of Precautions/Restrictions: Intact Restrictions Weight Bearing Restrictions Per Provider Order: No       Mobility Bed Mobility Overal bed mobility: Needs Assistance Bed Mobility: Supine to Sit     Supine to sit: Used rails, Supervision     General bed mobility comments: from full supine position    Transfers Overall transfer level: Needs assistance Equipment used: Rolling walker (2 wheels) Transfers: Sit to/from Stand, Bed to chair/wheelchair/BSC Sit to Stand: Supervision     Step pivot transfers: Supervision           Balance Overall balance assessment: Needs  assistance Sitting-balance support: Feet unsupported Sitting balance-Leahy Scale: Normal     Standing balance support: No upper extremity supported, Single extremity supported, During functional activity Standing balance-Leahy Scale: Good Standing balance comment: use of RW, no LOB                           ADL either performed or assessed with clinical judgement   ADL Overall ADL's : Needs assistance/impaired Eating/Feeding: Set up;Sitting Eating/Feeding Details (indicate cue type and reason): in recliner                 Lower Body Dressing: Maximal assistance;Sitting/lateral leans Lower Body Dressing Details (indicate cue type and reason): donn bil socks-husband assists at home                    Extremity/Trunk Assessment              Vision       Perception     Praxis     Communication Communication Communication: No apparent difficulties   Cognition Arousal: Alert Behavior During Therapy: WFL for tasks assessed/performed                                 Following commands: Intact        Cueing   Cueing Techniques: Verbal cues  Exercises      Shoulder Instructions       General Comments no reports of dizziness during session, slow  transitions made with good safety awareness    Pertinent Vitals/ Pain       Pain Assessment Pain Assessment: No/denies pain Pain Intervention(s): Monitored during session, Repositioned  Home Living                                          Prior Functioning/Environment              Frequency  Min 2X/week        Progress Toward Goals  OT Goals(current goals can now be found in the care plan section)  Progress towards OT goals: Progressing toward goals  Acute Rehab OT Goals Patient Stated Goal: go home OT Goal Formulation: With patient/family Time For Goal Achievement: 04/09/24 Potential to Achieve Goals: Fair  Plan      Co-evaluation                  AM-PAC OT 6 Clicks Daily Activity     Outcome Measure   Help from another person eating meals?: None Help from another person taking care of personal grooming?: None Help from another person toileting, which includes using toliet, bedpan, or urinal?: A Little Help from another person bathing (including washing, rinsing, drying)?: A Little Help from another person to put on and taking off regular upper body clothing?: None Help from another person to put on and taking off regular lower body clothing?: A Little 6 Click Score: 21    End of Session Equipment Utilized During Treatment: Rolling walker (2 wheels)  OT Visit Diagnosis: Unsteadiness on feet (R26.81);Repeated falls (R29.6)   Activity Tolerance Patient tolerated treatment well   Patient Left with call bell/phone within reach;with family/visitor present;in chair;with chair alarm set   Nurse Communication Mobility status        Time: 9075-9059 OT Time Calculation (min): 16 min  Charges: OT General Charges $OT Visit: 1 Visit OT Treatments $Therapeutic Activity: 8-22 mins  Fanchon Papania, OTR/L  03/28/24, 3:45 PM   Gwynevere Lizana E Mckynleigh Mussell 03/28/2024, 3:42 PM

## 2024-03-29 ENCOUNTER — Emergency Department
Admission: EM | Admit: 2024-03-29 | Discharge: 2024-03-29 | Disposition: A | Discharge status: Home / Self Care | Source: Home / Self Care | Attending: Emergency Medicine | Admitting: Emergency Medicine

## 2024-03-29 ENCOUNTER — Other Ambulatory Visit: Payer: Self-pay

## 2024-03-29 DIAGNOSIS — I951 Orthostatic hypotension: Secondary | ICD-10-CM

## 2024-03-29 DIAGNOSIS — N390 Urinary tract infection, site not specified: Secondary | ICD-10-CM

## 2024-03-29 DIAGNOSIS — R0989 Other specified symptoms and signs involving the circulatory and respiratory systems: Secondary | ICD-10-CM

## 2024-03-29 DIAGNOSIS — R55 Syncope and collapse: Secondary | ICD-10-CM

## 2024-03-29 LAB — URINALYSIS, ROUTINE W REFLEX MICROSCOPIC
Bilirubin Urine: NEGATIVE
Glucose, UA: 50 mg/dL — AB
Hgb urine dipstick: NEGATIVE
Ketones, ur: NEGATIVE mg/dL
Nitrite: NEGATIVE
Protein, ur: NEGATIVE mg/dL
Specific Gravity, Urine: 1.006 (ref 1.005–1.030)
pH: 6 (ref 5.0–8.0)

## 2024-03-29 LAB — COMPREHENSIVE METABOLIC PANEL WITH GFR
ALT: 14 U/L (ref 0–44)
AST: 22 U/L (ref 15–41)
Albumin: 3.1 g/dL — ABNORMAL LOW (ref 3.5–5.0)
Alkaline Phosphatase: 91 U/L (ref 38–126)
Anion gap: 11 (ref 5–15)
BUN: 16 mg/dL (ref 8–23)
CO2: 24 mmol/L (ref 22–32)
Calcium: 8.4 mg/dL — ABNORMAL LOW (ref 8.9–10.3)
Chloride: 96 mmol/L — ABNORMAL LOW (ref 98–111)
Creatinine, Ser: 1.25 mg/dL — ABNORMAL HIGH (ref 0.44–1.00)
GFR, Estimated: 42 mL/min — ABNORMAL LOW (ref >60)
Glucose, Bld: 360 mg/dL — ABNORMAL HIGH (ref 70–99)
Potassium: 4 mmol/L (ref 3.5–5.1)
Sodium: 132 mmol/L — ABNORMAL LOW (ref 135–145)
Total Bilirubin: 0.6 mg/dL (ref 0.0–1.2)
Total Protein: 6.4 g/dL — ABNORMAL LOW (ref 6.5–8.1)

## 2024-03-29 LAB — D-DIMER, QUANTITATIVE: D-Dimer, Quant: 0.68 ug{FEU}/mL — ABNORMAL HIGH (ref 0.00–0.50)

## 2024-03-29 LAB — TROPONIN I (HIGH SENSITIVITY)
Troponin I (High Sensitivity): 7 ng/L (ref <18)
Troponin I (High Sensitivity): 7 ng/L (ref <18)

## 2024-03-29 LAB — MAGNESIUM: Magnesium: 1.8 mg/dL (ref 1.7–2.4)

## 2024-03-29 MED ORDER — SODIUM CHLORIDE 0.9 % IV SOLN
2.0000 g | Freq: Once | INTRAVENOUS | Status: AC
  Administered 2024-03-29: 2 g via INTRAVENOUS
  Filled 2024-03-29: qty 20

## 2024-03-29 MED ORDER — SODIUM CHLORIDE 0.9 % IV BOLUS (SEPSIS)
1000.0000 mL | Freq: Once | INTRAVENOUS | Status: AC
  Administered 2024-03-29: 1000 mL via INTRAVENOUS

## 2024-03-29 MED ORDER — MIDODRINE HCL 5 MG PO TABS
10.0000 mg | ORAL_TABLET | Freq: Once | ORAL | Status: AC
  Administered 2024-03-29: 10 mg via ORAL
  Filled 2024-03-29: qty 2

## 2024-03-29 MED ORDER — CEPHALEXIN 500 MG PO CAPS: 500.0000 mg | ORAL_CAPSULE | Freq: Two times a day (BID) | ORAL | 0 refills | Status: DC

## 2024-03-29 NOTE — ED Provider Notes (Signed)
 [facility] Provider Note    Event Date/Time   First MD Initiated Contact with Patient 03/29/24 0120     (approximate)   History   Loss of Consciousness   HPI  Brandi [name] is a 88 y.o. female with history of atrial fibrillation, coronary artery disease, hypertension, diabetes, hyperlipidemia, recurrent syncope, orthostasis who presents to the emergency department after a syncopal event.  Patient denies any preceding symptoms that led to this.  Son reports he found her on the floor and felt like her breathing looked shallow so he gave her 2 chest compressions and then patient became responsive.  He did not check to see if she had a pulse or not.  She denies having any chest pain, shortness of breath, dizziness, palpitations, calf tenderness or calf swelling.  She was just discharged from the hospital yesterday for labile blood pressures.  Was found to be extremely hypertensive but then was orthostatic.  She states she had her blood pressure medications adjusted.  She has been worked up extensively for syncope both in and outpatient.   History provided by patient, son.    Past Medical History:  Diagnosis Date   Acquired cyst of kidney    Anemia    Anxiety    Aortic atherosclerosis    Arthritis, degenerative    Atrial fibrillation and flutter (HCC)    a.) CHA2DS2VASc = 6 (age x 2, sex, HTN, prior MI, T2DM);  b.) s/p LAA closure duirng CABG 05/29/2018; c.) rate/rhythm maintained on oral carvedilol  + verapamil ; no chronic anticoagulation s/p LAA closure   Baden-Walker grade 3 cystocele    Chronic cystitis    Coronary artery disease 08/05/2010   a.) LHC/PCI 08/05/2010: 10% oLM, 70% pLAD-1 (3.0 x 23 mm Xience V DES), 75% pLAD-2, 40% mLAD, 50% dLAD; b.) LHC 07/03/2011: 10% oLM, 10% ISR pLAD, 50% mLAD, LCx with min luminal irregs - med mgmt; c.) LHC 05/22/2018: 80% mLCx, 70% OM3, 90% oLAD, 60% pLAD, 60% ost 1st sep, 90% p-mRCA, 40% o-pLAD --> CVTS; d.) s/p 3v  CABG 05/29/2018   Depression    Diabetic neuropathy (HCC)    Dyspnea    GERD (gastroesophageal reflux disease)    Headache    History of left atrial appendage closure 05/29/2018   HLD (hyperlipidemia)    HTN (hypertension)    Implantable loop recorder present 09/20/2016   a.) has reached EOL; no longer funcitoning/being interrogated   Insomnia    a.) uses melatonin PRN   Murmur    NSTEMI (non-ST elevated myocardial infarction) (HCC) 05/20/2018   a.) troponins were trended: 0.30 --> 0.25 --> 0.22 --> 0.24 ng/L; b.) LHC 05/22/2018: 80% mLCx, 70% OM3, 90% oLAD, 60% pLAD, 60% ost 1st sep, 90% p-mRCA, 40% o-pLAD --> CVTS consult; c.) 3v CABG 05/29/2018: LIMA-LAD, SVG-OM1, SVG-RCA   NSVT (nonsustained ventricular tachycardia) (HCC) 01/2022   a.) holter 01/2022: rates as fast as 195 bpm lasting up to 12 beats   Obesity    OSA on CPAP    PONV (postoperative nausea and vomiting)    Presence of drug coated stent in LAD coronary artery    PSVT (paroxysmal supraventricular tachycardia) 01/2022   a.) holter 01/2022: rates as fast as 195 bpm lasting up to 12 beats   S/P CABG x 3 05/29/2018   a.) 3v CABG at [facility] 05/29/2018: LIMA-LAD, SVG-OM1, SVG-RCA   Sepsis (HCC)    Spinal stenosis    Syncopal episodes 10/2022   Syncope  T2DM (type 2 diabetes mellitus) (HCC)    Unstable angina (HCC)    Urge incontinence of urine     Past Surgical History:  Procedure Laterality Date   BREAST BIOPSY Right 2004   neg   BUNIONECTOMY     CHOLECYSTECTOMY     COLONOSCOPY WITH PROPOFOL  N/A 06/14/2017   Procedure: COLONOSCOPY WITH PROPOFOL ;  Surgeon: Gaylyn Gladis PENNER, MD;  Location: [facility] ENDOSCOPY;  Service: Endoscopy;  Laterality: N/A;   CORONARY ANGIOPLASTY WITH STENT PLACEMENT Left 08/05/2010   Procedure: CORONARY ANGIOPLASTY WITH STENT PLACEMENT; Location: [facility]; Surgeon(s): Vinie Jude, MD (diagnostic) and Margie Lovelace, MD (interventional)   CORONARY ARTERY BYPASS GRAFT N/A 05/29/2018   Procedure:  CORONARY ARTERY BYPASS GRAFT; Location: [facility]; Surgeon: Bernardino Lares, MD   HYSTEROSCOPY WITH D & C N/A 12/14/2022   Procedure: FRACTIONAL DILATATION AND CURETTAGE /HYSTEROSCOPY;  Surgeon: Lovetta Debby PARAS, MD;  Location: [facility] ORS;  Service: Gynecology;  Laterality: N/A;   LEFT ATRIAL APPENDAGE OCCLUSION Left 05/29/2018   Procedure: LEFT ATRIAL APPENDAGE CLOSURE; Location: [facility]; Surgeon: Bernardino Lares, MD   LEFT HEART CATH AND CORONARY ANGIOGRAPHY N/A 05/22/2018   Procedure: LEFT HEART CATH AND CORONARY ANGIOGRAPHY;  Surgeon: Hester Wolm PARAS, MD;  Location: [facility] INVASIVE CV LAB;  Service: Cardiovascular;  Laterality: N/A;   LEFT HEART CATH AND CORONARY ANGIOGRAPHY Left 07/03/2011   Procedure: LEFT HEART CATH AND CORONARY ANGIOGRAPHY; Location: [facility]; Surgeon: Vinie Jude, MD   LEFT HEART CATH AND CORONARY ANGIOGRAPHY Left 05/22/2018   Procedure: LEFT HEART CATH AND CORONARY ANGIOGRAPHY; Location: [facility]; Surgeon: Vinie Jude, MD   LOOP RECORDER INSERTION N/A 09/20/2016   Procedure: Loop Recorder Insertion;  Surgeon: Marsa Dooms, MD;  Location: [facility] INVASIVE CV LAB;  Service: Cardiovascular;  Laterality: N/A;   SEPTOPLASTY     TOTAL KNEE ARTHROPLASTY Bilateral    XI ROBOTIC ASSISTED INGUINAL HERNIA REPAIR WITH MESH Left 10/14/2020   Procedure: XI ROBOTIC ASSISTED INGUINAL HERNIA REPAIR WITH MESH;  Surgeon: Lane Shope, MD;  Location: [facility] ORS;  Service: General;  Laterality: Left;    MEDICATIONS:  Prior to Admission medications   Medication Sig Start Date End Date Taking? Authorizing Provider  acetaminophen  (TYLENOL ) 500 MG tablet Take 1,000 mg by mouth 3 (three) times daily.    [provider]  amLODipine  (NORVASC ) 5 MG tablet Take 1 tablet (5 mg total) by mouth daily. 03/29/24   Josette Ade, MD  aspirin  81 MG EC tablet Take 81 mg by mouth daily.     [provider]  atorvastatin  (LIPITOR) 40 MG tablet Take 40 mg by mouth every evening. 08/05/19    [provider]  budesonide-formoterol  (SYMBICORT) 80-4.5 MCG/ACT inhaler Inhale 2 puffs into the lungs 2 (two) times daily.    [provider]  carboxymethylcellulose (REFRESH PLUS) 0.5 % SOLN Apply 1-2 drops to eye at bedtime.     [provider]  carvedilol  (COREG ) 25 MG tablet Take 1 tablet (25 mg total) by mouth 2 (two) times daily with a meal. 03/28/24 04/27/24  Josette Ade, MD  Cyanocobalamin  (VITAMIN B-12 PO) Take 1 tablet by mouth daily.    [provider]  ferrous sulfate  325 (65 FE) MG tablet Take 325 mg by mouth daily with breakfast.    [provider]  furosemide (LASIX) 20 MG tablet Take 20 mg by mouth every other day.    [provider]  gabapentin  (NEURONTIN ) 400 MG capsule Take 400 mg by mouth 3 (three) times daily.    [provider]  insulin  glargine (LANTUS  SOLOSTAR) 100 UNIT/ML Solostar Pen Inject 5 Units into the skin every morning. 11/16/23   Dezii, Alexandra, DO  ipratropium (ATROVENT) 0.06 % nasal spray Place 2 sprays into the nose 3 (three) times daily as needed for rhinitis. 12/16/23 12/15/24  [provider]  JANUMET  XR 50-1000 MG TB24 Take 1 tablet by mouth 2 (two) times daily.    [provider]  losartan  (COZAAR ) 100 MG tablet Take 100 mg by mouth at bedtime.    [provider]  Melatonin 10 MG TABS Take 1 tablet by mouth at bedtime.    [provider]  mirabegron  ER (MYRBETRIQ ) 50 MG TB24 tablet Take 1 tablet (50 mg total) by mouth daily. 06/19/23   Helon Kirsch A, PA-C  Multiple Vitamins-Minerals (PRESERVISION/LUTEIN ) CAPS Take 1 capsule by mouth 2 (two) times daily. 03/29/20   Fausto Burnard LABOR, DO  nitroGLYCERIN  (NITROSTAT ) 0.4 MG SL tablet Place 0.4 mg under the tongue every 5 (five) minutes as needed for chest pain.    [provider]  progesterone  (PROMETRIUM ) 100 MG capsule Take 100 mg by mouth at bedtime.    [provider]  sodium chloride   (OCEAN) 0.65 % nasal spray Place 1 spray into the nose as needed. Patient not taking: Reported on 03/25/2024    [provider]  spironolactone (ALDACTONE) 25 MG tablet Take 1 tablet (25 mg total) by mouth daily. 03/29/24   Josette Ade, MD    Physical Exam   Triage Vital Signs: ED Triage Vitals [03/28/24 2307]  Encounter Vitals Group     BP (!) 139/57     Girls Systolic BP Percentile      Girls Diastolic BP Percentile      Boys Systolic BP Percentile      Boys Diastolic BP Percentile      Pulse Rate 76     Resp 18     Temp 98.3 F (36.8 C)     Temp Source Oral     SpO2 97 %     Weight      Height      Head Circumference      Peak Flow      Pain Score 0     Pain Loc      Pain Education      Exclude from Growth Chart     Most recent vital signs: Vitals:   03/29/24 0406 03/29/24 0450  BP: (!) 191/96 (!) 169/73  Pulse: 72 65  Resp: 19 16  Temp: (!) 97.4 F (36.3 C)   SpO2: 100% 99%    CONSTITUTIONAL: Alert, responds appropriately to questions. Well-appearing; well-nourished HEAD: Normocephalic, atraumatic EYES: Conjunctivae clear, pupils appear equal, sclera nonicteric ENT: normal nose; moist mucous membranes NECK: Supple, normal ROM, no midline spinal tenderness or step-off or deformity CARD: RRR; S1 and S2 appreciated RESP: Normal chest excursion without splinting or tachypnea; breath sounds clear and equal bilaterally; no wheezes, no rhonchi, no rales, no hypoxia or respiratory distress, speaking full sentences ABD/GI: Non-distended; soft, non-tender, no rebound, no guarding, no peritoneal signs BACK: The back appears normal EXT: Normal ROM in all joints; no deformity noted, no edema, no calf tenderness or calf swelling SKIN: Normal color for age and race; warm; no rash on exposed skin NEURO: Moves all extremities equally, normal speech, normal sensation diffusely, cranial nerves II through XII intact PSYCH: The patient's mood and manner are  appropriate.   ED Results / Procedures / Treatments  LABS: (all labs ordered are listed, but only abnormal results are displayed) Labs Reviewed  COMPREHENSIVE METABOLIC PANEL WITH GFR - Abnormal; Notable for the following components:      Result Value   Sodium 132 (*)    Chloride 96 (*)    Glucose, Bld 360 (*)    Creatinine, Ser 1.25 (*)    Calcium  8.4 (*)    Total Protein 6.4 (*)    Albumin 3.1 (*)    GFR, Estimated 42 (*)    All other components within normal limits  URINALYSIS, ROUTINE W REFLEX MICROSCOPIC - Abnormal; Notable for the following components:   Color, Urine YELLOW (*)    APPearance HAZY (*)    Glucose, UA 50 (*)    Leukocytes,Ua LARGE (*)    Bacteria, UA MANY (*)    All other components within normal limits  D-DIMER, QUANTITATIVE - Abnormal; Notable for the following components:   D-Dimer, Quant 0.68 (*)    All other components within normal limits  URINE CULTURE  CBC  MAGNESIUM   TROPONIN I (HIGH SENSITIVITY)  TROPONIN I (HIGH SENSITIVITY)     EKG:  EKG Interpretation Date/Time:  Friday March 28 2024 23:15:59 EDT Ventricular Rate:  81 PR Interval:  128 QRS Duration:  80 QT Interval:  390 QTC Calculation: 453 R Axis:   13  Text Interpretation: Normal sinus rhythm Normal ECG When compared with ECG of 25-Mar-2024 13:58, Premature atrial complexes are no longer Present Confirmed by Neomi Neptune [phone]) on 03/29/2024 1:21:49 AM         RADIOLOGY: My personal review and interpretation of imaging: CT head and cervical spine showed no acute traumatic injury.  I have personally reviewed all radiology reports.   CT HEAD WO CONTRAST ( ) Result Date: 03/28/2024 EXAM: CT HEAD AND CERVICAL SPINE 03/28/2024 11:41:00 PM TECHNIQUE: CT of the head and cervical spine was performed without the administration of intravenous contrast. Multiplanar reformatted images are provided for review. Automated exposure control, iterative reconstruction, and/or weight  based adjustment of the mA/kV was utilized to reduce the radiation dose to as low as reasonably achievable. COMPARISON: None available. CLINICAL HISTORY: Head trauma, minor (Age >= 65y). FINDINGS: CT HEAD BRAIN AND VENTRICLES: No acute intracranial hemorrhage. No mass effect or midline shift. No abnormal extra-axial fluid collection. No evidence of acute infarct. No hydrocephalus. Partially empty sella. White matter hypodensities, compatible with chronic microvascular ischemic change. ORBITS: No acute abnormality. SINUSES AND MASTOIDS: Partially opacified left maxillary sinus and right frontal sinus. SOFT TISSUES AND SKULL: No acute skull fracture. No acute soft tissue abnormality. CT CERVICAL SPINE BONES AND ALIGNMENT: No acute fracture or traumatic malalignment. DEGENERATIVE CHANGES: Multilevel cervical spondylosis with varying degrees of neural foraminal stenosis, likely greatest on the right at C4-C5. SOFT TISSUES: No prevertebral soft tissue swelling. IMPRESSION: 1. No acute intracranial abnormality. 2. No acute fracture or traumatic malalignment of the cervical spine. Electronically signed by: Gilmore Molt MD 03/28/2024 11:54 PM EDT RP Workstation: [id number]   CT Cervical Spine Wo Contrast Result Date: 03/28/2024 EXAM: CT HEAD AND CERVICAL SPINE 03/28/2024 11:41:00 PM TECHNIQUE: CT of the head and cervical spine was performed without the administration of intravenous contrast. Multiplanar reformatted images are provided for review. Automated exposure control, iterative reconstruction, and/or weight based adjustment of the mA/kV was utilized to reduce the radiation dose to as low as reasonably achievable. COMPARISON: None available. CLINICAL HISTORY: Head trauma, minor (Age >= 65y). FINDINGS: CT HEAD BRAIN AND VENTRICLES: No acute intracranial hemorrhage. No  mass effect or midline shift. No abnormal extra-axial fluid collection. No evidence of acute infarct. No hydrocephalus. Partially empty sella.  White matter hypodensities, compatible with chronic microvascular ischemic change. ORBITS: No acute abnormality. SINUSES AND MASTOIDS: Partially opacified left maxillary sinus and right frontal sinus. SOFT TISSUES AND SKULL: No acute skull fracture. No acute soft tissue abnormality. CT CERVICAL SPINE BONES AND ALIGNMENT: No acute fracture or traumatic malalignment. DEGENERATIVE CHANGES: Multilevel cervical spondylosis with varying degrees of neural foraminal stenosis, likely greatest on the right at C4-C5. SOFT TISSUES: No prevertebral soft tissue swelling. IMPRESSION: 1. No acute intracranial abnormality. 2. No acute fracture or traumatic malalignment of the cervical spine. Electronically signed by: Gilmore Molt MD 03/28/2024 11:54 PM EDT RP Workstation: [id number]     PROCEDURES:  Critical Care performed: No      .1-3 Lead EKG Interpretation  Performed by: Richardson Dubree, Josette SAILOR, DO Authorized by: Cissy Galbreath, Josette SAILOR, DO     Interpretation: normal     ECG rate:  65   ECG rate assessment: normal     Rhythm: sinus rhythm     Ectopy: none     Conduction: normal       IMPRESSION / MDM / ASSESSMENT AND PLAN / ED COURSE  I reviewed the triage vital signs and the nursing notes.    Patient here with recurrent syncope.  She is orthostatic here and her blood pressure is 100 systolic with standing.  History of very labile blood pressures and extensive workup for the same.  The patient is on the cardiac monitor to evaluate for evidence of arrhythmia and/or significant heart rate changes.   DIFFERENTIAL DIAGNOSIS (includes but not limited to):   Orthostasis, medication side effect, anemia, electrolyte derangement, dehydration, ACS, arrhythmia, PE, UTI, doubt sepsis   Patient's presentation is most consistent with acute presentation with potential threat to life or bodily function.   PLAN: Will give IV fluids, midodrine.  Will obtain labs, urine.  CT of the head and cervical spine reviewed and  interpreted by myself and the radiologist and show no acute traumatic injury.  She denies any symptoms currently other than dizziness with standing.   MEDICATIONS GIVEN IN ED: Medications  sodium chloride  0.9 % bolus 1,000 mL (0 mLs Intravenous Stopped 03/29/24 0405)  midodrine (PROAMATINE) tablet 10 mg (10 mg Oral Given 03/29/24 0223)  cefTRIAXone  (ROCEPHIN ) 2 g in sodium chloride  0.9 % 100 mL IVPB (0 g Intravenous Stopped 03/29/24 0530)     ED COURSE: Normal hemoglobin.  Minimally elevated creatinine and she has received a liter of fluid.  Troponin x 2 negative.  Age-adjusted D-dimer negative.  Urine does show white blood cells, bacteria.  Will cover for UTI.  Culture pending.  Patient feeling better with standing and blood pressure now improved with standing and she is able to ambulate without difficulty.  I feel she is safe for discharge home.  Patient and son are comfortable with this plan.  Have recommended that she change positions very slowly, monitor her blood pressures at home closely and hold her blood pressure medications if her blood pressures are low and call her PCP Monday morning.    At this time, I do not feel there is any life-threatening condition present. I reviewed all nursing notes, vitals, pertinent previous records.  All lab and urine results, EKGs, imaging ordered have been independently reviewed and interpreted by myself.  I reviewed all available radiology reports from any imaging ordered this visit.  Based on my assessment, I  feel the patient is safe to be discharged home without further emergent workup and can continue workup as an outpatient as needed. Discussed all findings, treatment plan as well as usual and customary return precautions.  They verbalize understanding and are comfortable with this plan.  Outpatient follow-up has been provided as needed.  All questions have been answered.    CONSULTS: Admission considered but workup reassuring, patient has a known  history of orthostasis and is orthostatic here likely the cause of her syncopal event.  Symptoms have improved and she is no longer dizzy with standing and blood pressure in the 160s with standing.   OUTSIDE RECORDS REVIEWED: Reviewed recent admission note.       FINAL CLINICAL IMPRESSION(S) / ED DIAGNOSES   Final diagnoses:  Recurrent syncope  Labile blood pressure  Orthostatic hypotension  Acute UTI     Rx / DC Orders   ED Discharge Orders          Ordered    cephALEXin  (KEFLEX ) 500 MG capsule  2 times daily        03/29/24 0454             Note:  This document was prepared using Dragon voice recognition software and may include unintentional dictation errors.   Kaaliyah Kita, Josette SAILOR, DO 03/29/24 [phone]

## 2024-03-31 LAB — URINE CULTURE: Culture: >=100000 — AB

## 2024-04-03 ENCOUNTER — Ambulatory Visit: Admitting: Pain Medicine

## 2024-04-07 NOTE — Progress Notes (Signed)
 History of Present Illness:   Brandi [name] is a 88 y.o. female here for   Verbally consented to the use of AI for note-taking.   Chief Complaint  Patient presents with  . Hospital Follow Up      History of Present Illness Brandi [name] is an 88 year old female who presents for hospital admission follow-up with a history of syncopal episodes and hypertension management.  She experienced a syncopal episode on October 7th while having a bowel movement, similar to a previous episode on May 8th, 2025, attributed to orthostasis. During the recent ER visit, her blood pressure was significantly elevated, and she was treated with labetalol  and a cardine drip. A CT head scan and troponin levels were normal. She had a constant headache in the ER, which improved with Toradol .  Her medication regimen was adjusted during the hospital stay. Hydralazine  was initially increased but later discontinued due to orthostasis. Coreg , losartan , Norvasc , and spironolactone were continued. She was advised to stop glipizide  and hydralazine . Potassium supplementation was provided for low potassium levels. She experiences occasional dizziness upon standing, which she manages by holding onto her walker.  She returned to the emergency room on October 11th due to a urinary tract infection, which has since resolved with no current urinary symptoms.  She has a history of recent steroid use, which precluded immediate cortisol level testing.  She reports some swelling, which she manages with a fluid pill and spironolactone. She also experiences itching at the site of a previous IV insertion, which she has been treating with eczema lotion.   Past Medical History:   Past Medical History:  Diagnosis Date  . Anemia, unspecified   . ASCVD (arteriosclerotic cardiovascular disease)   . Coronary arteriosclerosis due to lipid rich plaque 01/06/2014   status post PCI of proximal LAD with a Xience 3.0 x 23 mm drug-eluting stent    . Coronary artery disease   . Essential hypertension, benign   . Hemorrhage of rectum and anus   . Left wrist fracture 04/2016  . Obesity   . On continuous heparin  infusion 05/27/2018  . Osteoarthritis   . Other and unspecified hyperlipidemia   . Pelvic fracture (CMS/HHS-HCC)   . Pernicious anemia   . Presence of drug coated stent in LAD coronary artery 05/27/2018   Status post PCI of proximal LAD with a Xience 3.0 x 23 mm drug-eluting stent   . PUD (peptic ulcer disease)   . Recurrent UTI    Followed by Dr. Twylla  . Right ulnar fracture 07/2021  . Spinal stenosis   . Type 2 diabetes mellitus (CMS/HHS-HCC)     Past Surgical History:   Past Surgical History:  Procedure Laterality Date  . ERCP  1998  . KNEE ARTHROSCOPY Right 2004   Total knee Replacement  . KNEE ARTHROSCOPY Left 09/2008   Total Knee Replacement  . coronary stent  2012  . COLONOSCOPY  06/14/2017   Tubular adenoma of the colon/Repeat 4yrs if desired/MUS  . CORONARY ARTERY BYPASS W/SINGLE ARTERY GRAFT N/A 05/29/2018   Procedure: CORONARY ARTERY BYPASS, USING ARTERIAL GRAFT(S); SINGLE ARTERIAL GRAFT, Left internal mammary artery harvest;  Surgeon: Dora Bernardino Dover, MD;  Location: [facility];  Service: Cardiothoracic;  Laterality: N/A;  . CORONARY ARTERY BYPASS W/VEIN ONLY N/A 05/29/2018   Procedure: CORONARY ARTERY BYPASS, USING VENOUS GRAFT(S) AND ARTERIAL GRAFT(S);SINGLE VEIN GRAFT (LIST IN ADDITION TO PRIMARY PROCEDURE);  Surgeon: Dora Bernardino Dover, MD;  Location: [facility];  Service: Cardiothoracic;  Laterality: N/A;  . APPLICATION WOUND VAC N/A 05/29/2018   Procedure: NEGATIVE PRESSURE WOUND THERAPY, INCLUDING TOPICAL APPLICATION(S), ASSESSMENT, INSTRUCTION(S) FOR ONGOING CARE, PER SESSION; TOTAL WOUND(S) SURFACE AREA LESS THAN OR EQUAL TO 50 SQUARE CENTIMETERS;  Surgeon: Dora Bernardino Dover, MD;  Location: [facility];  Service: Cardiothoracic;  Laterality: N/A;  .  Fractional D&C with hysteroscopy  12/14/2022  . Bunionectomy Right   . CARDIAC CATHETERIZATION    . CHOLECYSTECTOMY    . CORONARY ANGIOPLASTY     status post PCI of proximal LAD with a Xience 3.0 x 23 mm drug-eluting stent  . CORONARY ARTERY BYPASS GRAFT    . REPAIR NASAL SEPTAL PERFORATIONS      Allergies:   Allergies  Allergen Reactions  . Accupril [Quinapril] Rash  . Micardis [Telmisartan] Dizziness    Current Medications:   Prior to Admission medications  Medication Sig Taking? Last Dose  acetaminophen  (TYLENOL ) 500 MG tablet Take 1,000 mg by mouth 2 (two) times daily    Yes Taking  amLODIPine  (NORVASC ) 5 MG tablet Take 5 mg by mouth once daily Yes Taking  aspirin  81 MG EC tablet Take 81 mg by mouth once daily. Yes Taking  atorvastatin  (LIPITOR) 40 MG tablet TAKE 1 TABLET(40 MG) BY MOUTH EVERY NIGHT Yes Taking  BD ULTRA-FINE SHORT PEN NEEDLE 31 gauge x 5/16 needle USE ONCE DAILY AS DIRECTED Yes Taking  blood glucose diagnostic (ACCU-CHEK AVIVA PLUS TEST STRP) test strip 3 (three) times daily Use as instructed to test blood sugar Yes Taking  blood-glucose sensor (FREESTYLE LIBRE 3 SENSOR) Use 1 each every 10 (ten) days Yes Taking  carboxymethylcellulose (REFRESH TEARS) 0.5 % ophthalmic solution Place 1 drop into both eyes nightly Yes Taking  carvediloL  (COREG ) 25 MG tablet Take 1 tablet (25 mg total) by mouth 2 (two) times daily with meals Yes Taking  cephalexin  (KEFLEX ) 500 MG capsule Take 500 mg by mouth 2 (two) times daily Yes Taking  cholecalciferol 1000 unit tablet Take by mouth Yes Taking  cyanocobalamin  (VITAMIN B12) 1000 MCG tablet Take 1,000 mcg by mouth once daily   Yes Taking  ferrous sulfate  325 (65 FE) MG tablet Take 325 mg by mouth once daily Yes Taking  fluticasone  propionate (FLONASE ) 50 mcg/actuation nasal spray Place 1 spray into both nostrils 2 (two) times daily Yes Taking  FUROsemide (LASIX) 20 MG tablet Take 1 tablet (20 mg total) by mouth every other day  Yes Taking  gabapentin  (NEURONTIN ) 400 MG capsule Take 1 capsule (400 mg total) by mouth 3 (three) times daily Yes Taking  insulin  GLARGINE (LANTUS  SOLOSTAR U-100 INSULIN ) pen injector (concentration 100 units/mL) INJECT 18 UNITS UNDER THE SKIN ONCE DAILY Patient taking differently: INJECT 5 UNITS UNDER THE SKIN ONCE DAILY Yes Taking  ipratropium (ATROVENT) 0.06 % nasal spray Place 2 sprays into both nostrils 3 (three) times daily as needed for Rhinitis Yes Taking  lancets (ACCU-CHEK SOFTCLIX LANCETS) USE AS DIRECTED TWICE DAILY Yes Taking  losartan  (COZAAR ) 100 MG tablet TAKE 1 TABLET(100 MG) BY MOUTH EVERY NIGHT Yes Taking  meclizine  (ANTIVERT ) 12.5 mg tablet Take 12.5 mg 1-3 times daily as needed for vertigo Yes Taking  melatonin 10 mg Tab Take 1 tablet by mouth nightly Yes Taking  mirabegron  (MYRBETRIQ ) 50 mg ER tablet Take 50 mg by mouth once daily Yes Taking  multivitamin with minerals, EYE, (PRESERVISION AREDS 2) soft gel capsule Take 1 capsule by mouth 2 (two) times daily with meals  Yes Taking  polyethylene glycol (MIRALAX ) powder Mix in 4-8ounces of fluid prior to taking. Yes Taking  progesterone  (PROMETRIUM ) 100 MG capsule Take 1 capsule (100 mg total) by mouth at bedtime Yes Taking  SITagliptin  phos-metFORMIN  (JANUMET  XR) 50-1,000 mg ER 24 hr mutliphase tablet Take 1 tablet by mouth once daily Yes Taking  spironolactone (ALDACTONE) 25 MG tablet Take 25 mg by mouth once daily Yes Taking  SYMBICORT 80-4.5 mcg/actuation inhaler INHALE 2 PUFFS INTO THE LUNGS TWICE DAILY Yes Taking  gabapentin  (NEURONTIN ) 300 MG capsule Take 300 mg by mouth 3 (three) times daily Patient not taking: Reported on 04/07/2024  Not Taking  nitroGLYcerin  (NITROSTAT ) 0.4 MG SL tablet Place 1 tablet (0.4 mg total) under the tongue every 5 (five) minutes as needed for Chest pain May take up to 3 doses.    triamcinolone  0.5 % cream APPLY TOPICALLY TO THE AFFECTED AREA TWICE DAILY Patient not taking: Reported on  04/07/2024  Not Taking  verapamiL  (VERELAN  PM) 300 mg ER capsule TAKE 1 CAPSULE(300 MG) BY MOUTH AT BEDTIME Patient not taking: Reported on 04/07/2024  Not Taking    Family History:   Family History  Problem Relation Name Age of Onset  . Alzheimer's disease Mother    . Myocardial Infarction (Heart attack) Mother    . No Known Problems Father    . No Known Problems Sister    . No Known Problems Brother    . No Known Problems Daughter    . No Known Problems Son    . Autism Maternal Aunt    . No Known Problems Maternal Uncle    . No Known Problems Paternal Aunt    . No Known Problems Paternal Uncle    . No Known Problems Maternal Grandmother    . No Known Problems Maternal Grandfather    . No Known Problems Paternal Grandmother    . No Known Problems Paternal Grandfather      Social History:   Social History   Socioeconomic History  . Marital status: Widowed  Occupational History  . Occupation: retired  Tobacco Use  . Smoking status: Never  . Smokeless tobacco: Never  Vaping Use  . Vaping status: Never Used  Substance and Sexual Activity  . Alcohol  use: No    Alcohol /week: 0.0 standard drinks of alcohol   . Drug use: Never  . Sexual activity: Not Currently    Birth control/protection: Post-menopausal   Social Drivers of Health   Financial Resource Strain: Low Risk  (02/22/2023)   Overall Financial Resource Strain (CARDIA)   . Difficulty of Paying Living Expenses: Not hard at all  Food Insecurity: No Food Insecurity (03/27/2024)   Received from [facility]   Hunger Vital Sign   . Within the past 12 months, you worried that your food would run out before you got the money to buy more.: Never true   . Within the past 12 months, the food you bought just didn't last and you didn't have money to get more.: Never true  Transportation Needs: No Transportation Needs (03/27/2024)   Received from [facility] - Transportation   . In the past 12 months, has lack of  transportation kept you from medical appointments or from getting medications?: No   . In the past 12 months, has lack of transportation kept you from meetings, work, or from getting things needed for daily living?: No  Social Connections: Patient Declined (03/27/2024)   Received from [facility]  Social Connection and Isolation Panel   . In a typical week, how many times do you talk on the phone with family, friends, or neighbors?: Patient declined   . How often do you get together with friends or relatives?: Patient declined   . How often do you attend church or religious services?: Patient declined   . Do you belong to any clubs or organizations such as church groups, unions, fraternal or athletic groups, or school groups?: Patient declined   . How often do you attend meetings of the clubs or organizations you belong to?: Patient declined   . Are you married, widowed, divorced, separated, never married, or living with a partner?: Patient declined  Housing Stability: Low Risk  (07/05/2023)   Housing Stability Vital Sign   . Unable to Pay for Housing in the Last Year: No   . Number of Times Moved in the Last Year: 0   . Homeless in the Last Year: No    Review of Systems:   A 10 point review of systems is negative, except for the pertinent positives and negatives detailed in the HPI.  Vitals:   Vitals:   04/07/24 1135  BP: 110/72  Pulse: 79  SpO2: 96%  Weight: 79.4 kg (175 lb)     Body mass index is 33.07 kg/m.  Physical Exam:   Physical Exam Vitals and nursing note reviewed.  Constitutional:      General: She is not in acute distress.    Appearance: Normal appearance. She is not ill-appearing, toxic-appearing or diaphoretic.  HENT:     Head: Normocephalic and atraumatic.     Right Ear: External ear normal.     Left Ear: External ear normal.  Eyes:     Conjunctiva/sclera: Conjunctivae normal.  Cardiovascular:     Rate and Rhythm: Normal rate and regular rhythm.      Pulses: Normal pulses.     Heart sounds: Normal heart sounds.  Pulmonary:     Effort: Pulmonary effort is normal.     Breath sounds: Normal breath sounds.  Skin:    General: Skin is warm.     Findings: No erythema or rash.  Neurological:     Mental Status: She is alert.     Assessment and Plan:  No results found for this visit on 04/07/24.  I reviewed the hospital discharge summary, medication and allergies reconciliation, and any relevant labs/imaging. A phone call was made to the patient within 2 business days of discharge to review their condition, medications, and follow-up needs. Patient seen in clinic today for post-discharge follow-up as part of transition of care management.  Assessment & Plan Orthostatic hypotension with syncope Recent syncopal episode linked to orthostatic hypotension. Blood pressure management adjusted during hospitalization. Occasional dizziness persists, manageable with walker. - Monitor blood pressure and dizziness. - Report worsening dizziness or unstable blood pressure. - Fasting cortisol level ordered for 05/07/2024  Hypertension Elevated blood pressure with recent hypertensive crisis. Medications adjusted; current regimen includes carvedilol , losartan , amlodipine , and spironolactone. Tolerating regimen well. - Continue current antihypertensive regimen. - Monitor blood pressure regularly. - Report side effects or blood pressure changes.  Type 2 diabetes mellitus Type 2 diabetes mellitus with recent discontinuation of glipizide . Ongoing blood glucose monitoring with reader device. - Monitor blood glucose levels regularly. - Follow-up with Cassandra as scheduled  Lower extremity edema Chronic lower extremity edema managed with diuretics. Compression hose used as needed. - Continue spironolactone. - Use diuretics as needed. - Wear compression hose  as needed.  Pruritus, localized Localized pruritus at previous IV site, likely irritation. No  infection signs. - Apply hydrocortisone cream. - Consider oral antihistamine like Allegra, Zyrtec, or Claritin . - Monitor for infection or worsening symptoms.   Disposition: Follow-up as scheduled  There are no Patient Instructions on file for this visit.   This note has been created using automated tools and reviewed for accuracy by provider.  Patient received an After Visit Summary    Attestation Statement:   I personally performed the service, non-incident to. (WP)   MASON MCCLELLAND MINOR, PA

## 2024-04-23 DIAGNOSIS — G8929 Other chronic pain: Secondary | ICD-10-CM | POA: Insufficient documentation

## 2024-04-23 DIAGNOSIS — M545 Low back pain, unspecified: Secondary | ICD-10-CM | POA: Insufficient documentation

## 2024-04-23 NOTE — Patient Instructions (Signed)
 ______________________________________________________________________    Post-Procedure Discharge Instructions  INSTRUCTIONS Apply ice:  Purpose: This will minimize any swelling and discomfort after procedure.  When: Day of procedure, as soon as you get home. How: Fill a plastic sandwich bag with crushed ice. Cover it with a small towel and apply to injection site. How long: (15 min on, 15 min off) Apply for 15 minutes then remove x 15 minutes.  Repeat sequence on day of procedure, until you go to bed. Apply heat:  Purpose: To treat any soreness and discomfort from the procedure. When: Starting the next day after the procedure. How: Apply heat to procedure site starting the day following the procedure. How long: May continue to repeat daily, until discomfort goes away. Food intake: Start with clear liquids (like water) and advance to regular food, as tolerated.  Physical activities: Keep activities to a minimum for the first 8 hours after the procedure. After that, then as tolerated. Driving: If you have received any sedation, be responsible and do not drive. You are not allowed to drive for 24 hours after having sedation. Blood thinner: (Applies only to those taking blood thinners) You may restart your blood thinner 6 hours after your procedure. Insulin : (Applies only to Diabetic patients taking insulin ) As soon as you can eat, you may resume your normal dosing schedule. Infection prevention: Keep procedure site clean and dry. Shower daily and clean area with soap and water.  PAIN DIARY Post-procedure Pain Diary: Extremely important that this be done correctly and accurately. Recorded information will be used to determine the next step in treatment. For the purpose of accuracy, follow these rules: Evaluate only the area treated. Do not report or include pain from an untreated area. For the purpose of this evaluation, ignore all other areas of pain, except for the treated area. After your  procedure, avoid taking a long nap and attempting to complete the pain diary after you wake up. Instead, set your alarm clock to go off every hour, on the hour, for the initial 8 hours after the procedure. Document the duration of the numbing medicine, and the relief you are getting from it. Do not go to sleep and attempt to complete it later. It will not be accurate. If you received sedation, it is likely that you were given a medication that may cause amnesia. Because of this, completing the diary at a later time may cause the information to be inaccurate. This information is needed to plan your care. Follow-up appointment: Keep your post-procedure follow-up evaluation appointment after the procedure (usually 2 weeks for most procedures, 6 weeks for radiofrequencies). DO NOT FORGET to bring you pain diary with you.   EXPECT... (What should I expect to see with my procedure?) From numbing medicine (AKA: Local Anesthetics): Numbness or decrease in pain. You may also experience some weakness, which if present, could last for the duration of the local anesthetic. Onset: Full effect within 15 minutes of injected. Duration: It will depend on the type of local anesthetic used. On the average, 1 to 8 hours.  From steroids (None used) From procedure: Some discomfort is to be expected once the numbing medicine wears off. This should be minimal if ice and heat are applied as instructed.  CALL IF... (When should I call?) You experience numbness and weakness that gets worse with time, as opposed to wearing off. New onset bowel or bladder incontinence. (Applies only to procedures done in the spine)  Emergency Numbers: [place] business hours (Monday - Thursday,  8:00 AM - 4:00 PM) (Friday, 9:00 AM - 12:00 Noon): [phone] After hours: [phone] NOTE: If you are having a problem and are unable connect with, or to talk to a provider, then go to your nearest urgent care or emergency department. If the  problem is serious and urgent, please call 911. ______________________________________________________________________

## 2024-04-23 NOTE — Progress Notes (Unsigned)
 PROVIDER NOTE: Interpretation of information contained herein should be left to medically-trained personnel. Specific patient instructions are provided elsewhere under Patient Instructions section of medical record. This document was created in part using STT-dictation technology, any transcriptional errors that may result from this process are unintentional.  Patient: Brandi [name] Type: Established DOB: 02-19-1936 MRN: [id number] PCP: Rudolpho Norleen BIRCH, MD  Service: Procedure DOS: 04/24/2024 Setting: Ambulatory Location: Ambulatory outpatient facility Delivery: Face-to-face Provider: Eric DELENA Como, MD Specialty: Interventional Pain Management Specialty designation: 09 Location: Outpatient facility Ref. Prov.: Rudolpho Norleen BIRCH, MD       Interventional Therapy   Type: Lumbar Facet, Medial Branch Block(s) (w/ fluoroscopic mapping) #1 (WITHOUT STEROIDS) Laterality: Bilateral  Level: L2, L3, L4, L5, and S1 Medial Branch/Dorsal Rami Level(s). Injecting these levels blocks the L3-4, L4-5, and L5-S1 lumbar facet joints. Imaging: Fluoroscopic guidance Spinal ([id number]) Anesthesia: Local anesthesia (1-2% Lidocaine ) Anxiolysis: IV Versed  1.0 mg Sedation: No Sedation                       DOS: 04/24/2024 Performed by: Eric DELENA Como, MD  Primary Purpose: Diagnostic/Therapeutic Indications: Low back pain severe enough to impact quality of life or function. 1. Chronic low back pain (Bilateral) w/o sciatica   2. Grade 1 Anterolisthesis of lumbosacral spine of L3/L4 and L5/S1   3. Lumbar facet joint pain   4. Lumbar facet hypertrophy (Multilevel) (Bilateral)   5. Lumbar facet joint syndrome   6. Spondylosis without myelopathy or radiculopathy, lumbosacral region   7. Low back pain of over 3 months duration   8. Multifactorial low back pain    NAS-11 Pain score:   Pre-procedure: 6/10   Post-procedure: 0-No pain/10     Position / Prep / Materials:  Position: Prone  Prep  solution: ChloraPrep (2% chlorhexidine  gluconate and 70% isopropyl alcohol ) Area Prepped: Posterolateral Lumbosacral Spine (Wide prep: From the lower border of the scapula down to the end of the tailbone and from flank to flank.)  Materials:  Tray: Block Needle(s):  Type: Spinal  Gauge (G): 22  Length: 3.5-in Qty: 4     H&P (Pre-op Assessment):  Brandi [name] is a 88 y.o. (year old), female patient, seen today for interventional treatment. She  has a past surgical history that includes Cholecystectomy; Total knee arthroplasty (Bilateral); Septoplasty; Bunionectomy; LOOP RECORDER INSERTION (N/A, 09/20/2016); Colonoscopy with propofol  (N/A, 06/14/2017); Breast biopsy (Right, 2004); LEFT HEART CATH AND CORONARY ANGIOGRAPHY (N/A, 05/22/2018); XI Robotic assisted inguinal hernia repair with mesh (Left, 10/14/2020); Coronary artery bypass graft (N/A, 05/29/2018); LEFT ATRIAL APPENDAGE OCCLUSION (Left, 05/29/2018); Coronary angioplasty with stent (Left, 08/05/2010); LEFT HEART CATH AND CORONARY ANGIOGRAPHY (Left, 07/03/2011); LEFT HEART CATH AND CORONARY ANGIOGRAPHY (Left, 05/22/2018); and Hysteroscopy with D & C (N/A, 12/14/2022). Brandi [name] has a current medication list which includes the following prescription(s): acetaminophen , amlodipine , aspirin  ec, atorvastatin , budesonide-formoterol , carboxymethylcellulose, carvedilol , cyanocobalamin , ferrous sulfate , furosemide, gabapentin , lantus  solostar, ipratropium, janumet  xr, losartan , melatonin, mirabegron  er, preservision/lutein , nitroglycerin , progesterone , sodium chloride , spironolactone, and cephalexin . Her primarily concern today is the No chief complaint on file.  Initial Vital Signs:  Pulse/HCG Rate: 72ECG Heart Rate: 83 Temp: (!) 97.2 F (36.2 C) Resp: 14 BP: (!) 157/66 SpO2: 97 %  BMI: Estimated body mass index is 32.5 kg/m as calculated from the following:   Height as of this encounter: 5' 1 (1.549 m).   Weight as of this encounter: 172 lb (78  kg).  Risk Assessment: Allergies: Reviewed. She is allergic to  accupril [quinapril hcl], micardis [telmisartan], and quinapril.  Allergy Precautions: None required Coagulopathies: Reviewed. None identified.  Blood-thinner therapy: None at this time Active Infection(s): Reviewed. None identified. [name] is afebrile  Site Confirmation: Brandi [name] was asked to confirm the procedure and laterality before marking the site Procedure checklist: Completed Consent: Before the procedure and under the influence of no sedative(s), amnesic(s), or anxiolytics, the patient was informed of the treatment options, risks and possible complications. To fulfill our ethical and legal obligations, as recommended by the American Medical Association's Code of Ethics, I have informed the patient of my clinical impression; the nature and purpose of the treatment or procedure; the risks, benefits, and possible complications of the intervention; the alternatives, including doing nothing; the risk(s) and benefit(s) of the alternative treatment(s) or procedure(s); and the risk(s) and benefit(s) of doing nothing. The patient was provided information about the general risks and possible complications associated with the procedure. These may include, but are not limited to: failure to achieve desired goals, infection, bleeding, organ or nerve damage, allergic reactions, paralysis, and death. In addition, the patient was informed of those risks and complications associated to Spine-related procedures, such as failure to decrease pain; infection (i.e.: Meningitis, epidural or intraspinal abscess); bleeding (i.e.: epidural hematoma, subarachnoid hemorrhage, or any other type of intraspinal or peri-dural bleeding); organ or nerve damage (i.e.: Any type of peripheral nerve, nerve root, or spinal cord injury) with subsequent damage to sensory, motor, and/or autonomic systems, resulting in permanent pain, numbness, and/or weakness of one or  several areas of the body; allergic reactions; (i.e.: anaphylactic reaction); and/or death. Furthermore, the patient was informed of those risks and complications associated with the medications. These include, but are not limited to: allergic reactions (i.e.: anaphylactic or anaphylactoid reaction(s)); adrenal axis suppression; blood sugar elevation that in diabetics may result in ketoacidosis or comma; water retention that in patients with history of congestive heart failure may result in shortness of breath, pulmonary edema, and decompensation with resultant heart failure; weight gain; swelling or edema; medication-induced neural toxicity; particulate matter embolism and blood vessel occlusion with resultant organ, and/or nervous system infarction; and/or aseptic necrosis of one or more joints. Finally, the patient was informed that Medicine is not an exact science; therefore, there is also the possibility of unforeseen or unpredictable risks and/or possible complications that may result in a catastrophic outcome. The patient indicated having understood very clearly. We have given the patient no guarantees and we have made no promises. Enough time was given to the patient to ask questions, all of which were answered to the patient's satisfaction. Ms. Margerum has indicated that she wanted to continue with the procedure. Attestation: I, the ordering provider, attest that I have discussed with the patient the benefits, risks, side-effects, alternatives, likelihood of achieving goals, and potential problems during recovery for the procedure that I have provided informed consent. Date  Time: 04/24/2024  8:11 AM  Pre-Procedure Preparation:  Monitoring: As per clinic protocol. Respiration, ETCO2, SpO2, BP, heart rate and rhythm monitor placed and checked for adequate function Safety Precautions: Patient was assessed for positional comfort and pressure points before starting the procedure. Time-out: I initiated  and conducted the Time-out before starting the procedure, as per protocol. The patient was asked to participate by confirming the accuracy of the Time Out information. Verification of the correct person, site, and procedure were performed and confirmed by me, the nursing staff, and the patient. Time-out conducted as per Joint Commission's Universal Protocol (UP.01.01.01). Time:  9163 Start Time: 0836 hrs.  Description of Procedure:          Laterality: (see above) Targeted Levels: (see above)  Safety Precautions: Aspiration looking for blood return was conducted prior to all injections. At no point did we inject any substances, as a needle was being advanced. Before injecting, the patient was told to immediately notify me if she was experiencing any new onset of ringing in the ears, or metallic taste in the mouth. No attempts were made at seeking any paresthesias. Safe injection practices and needle disposal techniques used. Medications properly checked for expiration dates. SDV (single dose vial) medications used. After the completion of the procedure, all disposable equipment used was discarded in the proper designated medical waste containers. Local Anesthesia: Protocol guidelines were followed. The patient was positioned over the fluoroscopy table. The area was prepped in the usual manner. The time-out was completed. The target area was identified using fluoroscopy. A 12-in long, straight, sterile hemostat was used with fluoroscopic guidance to locate the targets for each level blocked. Once located, the skin was marked with an approved surgical skin marker. Once all sites were marked, the skin (epidermis, dermis, and hypodermis), as well as deeper tissues (fat, connective tissue and muscle) were infiltrated with a small amount of a short-acting local anesthetic, loaded on a 10cc syringe with a 25G, 1.5-in  Needle. An appropriate amount of time was allowed for local anesthetics to take effect  before proceeding to the next step. Local Anesthetic: Lidocaine  2.0% The unused portion of the local anesthetic was discarded in the proper designated containers. Technical description of process:  Medial Branch  Dorsal Rami Nerve Block (MBB):  Neuroanatomy note: Each lumbar facet joint receives dual innervation from medial branches arising from the posterior primary rami at the same level and one level above. The target for each lumbar medial branch is the junction of the ipsilateral superior articular and transverse process of the lower vertebral body. (i.e.: The L4-L5 facet joint is innervated by the L4 medial branch [located at L5] and the L3 medial branch [located at L4]. Blocking the L4 Medial Branch is therefore achieved by injecting at the junction of the ipsilateral superior articular and transverse process of the lower vertebral body [L5].).  Exception: The exception to the above rule is the L5-S1 facet joint which has triple innervation requiring the L4 medial branch, as well as the L5 and the S1 Dorsal Rami(s) to be blocked to fully denervate the joint.  Under fluoroscopic guidance, a needle was inserted until contact was made with os over the target area. After negative aspiration, 0.5 mL of the nerve block solution was injected without difficulty or complication. Paresthesia were avoided during injection. The needle(s) were removed intact and without complication.  Once the entire procedure was completed, the treated area was cleaned, making sure to leave some of the prepping solution back to take advantage of its long term bactericidal properties.         Illustration of the posterior view of the lumbar spine and the posterior neural structures. Laminae of L2 through S1 are labeled. DPRL5, dorsal primary ramus of L5; DPRS1, dorsal primary ramus of S1; DPR3, dorsal primary ramus of L3; FJ, facet (zygapophyseal) joint L3-L4; I, inferior articular process of L4; LB1, lateral branch of  dorsal primary ramus of L1; IAB, inferior articular branches from L3 medial branch (supplies L4-L5 facet joint); IBP, intermediate branch plexus; MB3, medial branch of dorsal primary ramus of L3; NR3, third lumbar  nerve root; S, superior articular process of L5; SAB, superior articular branches from L4 (supplies L4-5 facet joint also); TP3, transverse process of L3.   Facet Joint Innervation (* possible contribution)  L1-2 T12, L1 (L2*)  Medial Branch  L2-3 L1, L2 (L3*)                     L3-4 L2, L3 (L4*)                     L4-5 L3, L4 (L5*)                     L5-S1 L4, L5, S1                        Vitals:   04/24/24 0835 04/24/24 0840 04/24/24 0845 04/24/24 0855  BP: (!) 172/82 (!) 194/92 (!) 186/79 (!) 184/73  Pulse:      Resp: 17 15 16 16   Temp:      TempSrc:      SpO2: 98% 99% 99% 99%  Weight:      Height:         End Time: 0846 hrs.  Imaging Guidance (Spinal):         Type of Imaging Technique: Fluoroscopy Guidance (Spinal) Indication(s): Fluoroscopy guidance for needle placement to enhance accuracy in procedures requiring precise needle localization for targeted delivery of medication in or near specific anatomical locations not easily accessible without such real-time imaging assistance. Exposure Time: Please see nurses notes. Contrast: None used. Fluoroscopic Guidance: I was personally present during the use of fluoroscopy. Tunnel Vision Technique used to obtain the best possible view of the target area. Parallax error corrected before commencing the procedure. Direction-depth-direction technique used to introduce the needle under continuous pulsed fluoroscopy. Once target was reached, antero-posterior, oblique, and lateral fluoroscopic projection used confirm needle placement in all planes. Images permanently stored in EMR. Interpretation: No contrast injected. I personally interpreted the imaging intraoperatively. Adequate needle placement confirmed in  multiple planes. Permanent images saved into the patient's record.  Post-operative Assessment:  Post-procedure Vital Signs:  Pulse/HCG Rate: 7275 Temp: (!) 97.2 F (36.2 C) Resp: 16 BP: (!) 184/73 SpO2: 99 %  EBL: None  Complications: No immediate post-treatment complications observed by team, or reported by patient.  Note: The patient tolerated the entire procedure well. A repeat set of vitals were taken after the procedure and the patient was kept under observation following institutional policy, for this type of procedure. Post-procedural neurological assessment was performed, showing return to baseline, prior to discharge. The patient was provided with post-procedure discharge instructions, including a section on how to identify potential problems. Should any problems arise concerning this procedure, the patient was given instructions to immediately contact us , at any time, without hesitation. In any case, we plan to contact the patient by telephone for a follow-up status report regarding this interventional procedure.  Comments:  No additional relevant information.  Plan of Care (POC)  Orders:  Orders Placed This Encounter  Procedures   LUMBAR FACET(MEDIAL BRANCH NERVE BLOCK) MBNB    Scheduling Instructions:     Procedure: Lumbar facet block (AKA.: Lumbosacral medial branch nerve block) (without steroids)     Side: Bilateral     Level: L3-4, L4-5, and L5-S1 Facets (L2, L3, L4, L5, and S1 Medial Branch Nerves)     Sedation: Patient's choice.     Date: 04/24/2024    Where  will this procedure be performed?:   [facility]   DG [facility] C-ARM 1-60 MIN NO REPORT    Intraoperative interpretation by procedural physician at [facility].    Standing Status:   Standing    Number of Occurrences:   1    Reason for exam::   Assistance in needle guidance and placement for procedures requiring needle placement in or near specific anatomical locations not easily  accessible without such assistance.   Informed Consent Details: Physician/Practitioner Attestation; Transcribe to consent form and obtain patient signature    Nursing Order: Transcribe to consent form and obtain patient signature. Note: Always confirm laterality of pain with Ms. Claudene, before procedure.    Physician/Practitioner attestation of informed consent for procedure/surgical case:   I, the physician/practitioner, attest that I have discussed with the patient the benefits, risks, side effects, alternatives, likelihood of achieving goals and potential problems during recovery for the procedure that I have provided informed consent.    Procedure:   Lumbar Facet Block  under fluoroscopic guidance    Physician/Practitioner performing the procedure:   Emika Tiano A. Tanya MD    Indication/Reason:   Low Back Pain, with our without leg pain, due to Facet Joint Arthralgia (Joint Pain) Spondylosis (Arthritis of the Spine), without myelopathy or radiculopathy (Nerve Damage).   Provide equipment / supplies at bedside    Procedure tray: Block Tray (Disposable  single use) Skin infiltration needle: Regular 1.5-in, 25-G, (x1) Block Needle type: Spinal Amount/quantity: 4 Size: Regular (3.5-inch) Gauge: 22G    Standing Status:   Standing    Number of Occurrences:   1    Specify:   Block Tray   Saline lock IV    Have LR [id number] mL available and administer at 125 mL/hr if patient becomes hypotensive.    Standing Status:   Standing    Number of Occurrences:   1     Opioid Analgesic: No chronic opioid analgesics therapy prescribed by our practice. None MME/day: 0 mg/day    Medications ordered for procedure: Meds ordered this encounter  Medications   lidocaine  (XYLOCAINE ) 2 % (with pres) injection 400 mg   pentafluoroprop-tetrafluoroeth (GEBAUERS) aerosol   midazolam  PF (VERSED ) injection 0.5-2 mg    Make sure Flumazenil is available in the pyxis when using this medication. If oversedation  occurs, administer 0.2 mg IV over 15 sec. If after 45 sec no response, administer 0.2 mg again over 1 min; may repeat at 1 min intervals; not to exceed 4 doses (1 mg)   ropivacaine  (PF) 2 mg/mL (0.2%) (NAROPIN ) injection 18 mL   Medications administered: We administered lidocaine , pentafluoroprop-tetrafluoroeth, midazolam  PF, and ropivacaine  (PF) 2 mg/mL (0.2%).  See the medical record for exact dosing, route, and time of administration.    Interventional Therapies  Risk Factors  Considerations  Medical Comorbidities:  T2IDDM  MO (BMI>35)  CAD s/p CABG x 3  Hx A-Fib/Flutter  Hx. Orthostatic Hypotension  Stage 3 CKD  GERD  HTN  Cystocele Grade 3  Incontinence     Planned  Pending:   Diagnostic bilateral lumbar facet (L2-S1) MBB #1    Under consideration:   Diagnostic right sacroiliac joint BLK #2  Diagnostic right lumbar facet MBB #2    Completed:   Diagnostic right SI joint Blk x1 (08/21/2023) (100/100/0/0) (Requested by referring NS: Dr. CHRISTELLA. CHRISTELLA. Abd-El-Barr ) Diagnostic/therapeutic right L4-5 LESI x1 (10/16/2023) (0/100/25 x 2 weeks/LBP:0LEP:100)  Diagnostic right lumbar facet (L2-S1) MBB x1 (11/13/2023) (Undetermined) (No  useful information from patient)   Therapeutic  Palliative (PRN) options:   Diagnostic (L)LE NCT (EMG/PNCV) (03/07/2021) by Arthea Farrow, MD ([facility]) Dx: Electrodiagnostic evidence of chronic severe sensorimotor polyneuropathy.   Completed by other providers:   Therapeutic right L4-5 TFESI x3 (05/24/2017, 06/29/2017, 08/30/2017) by Morene Falcon, DO [facility])  Therapeutic right SI joint inj. x4 (09/28/2016, 02/20/2017, 12/04/2017, 01/24/2018) by Morene Falcon, DO [facility])  Diagnostic left IA hip joint inj. x1 (06/10/2015) by Morene Falcon, DO [facility] PMR)        Follow-up plan:   Return in about 2 weeks (around 05/08/2024) for (Face2F), (PPE).     Recent Visits Date Type Provider Dept  02/11/24 Office Visit Tanya Glisson,  MD [facility]  Showing recent visits within past 90 days and meeting all other requirements Today's Visits Date Type Provider Dept  04/24/24 Procedure visit Tanya Glisson, MD [facility]  Showing today's visits and meeting all other requirements Future Appointments Date Type Provider Dept  05/19/24 Appointment Tanya Glisson, MD [facility]  Showing future appointments within next 90 days and meeting all other requirements   Disposition: Discharge home  Discharge (Date  Time): 04/24/2024; 0903 hrs.   Primary Care Physician: Rudolpho Norleen BIRCH, MD Location: [facility] Note by: Glisson DELENA Tanya, MD (TTS technology used. I apologize for any typographical errors that were not detected and corrected.) Date: 04/24/2024; Time: 9:30 AM  Disclaimer:  Medicine is not an visual merchandiser. The only guarantee in medicine is that nothing is guaranteed. It is important to note that the decision to proceed with this intervention was based on the information collected from the patient. The Data and conclusions were drawn from the patient's questionnaire, the interview, and the physical examination. Because the information was provided in large part by the patient, it cannot be guaranteed that it has not been purposely or unconsciously manipulated. Every effort has been made to obtain as much relevant data as possible for this evaluation. It is important to note that the conclusions that lead to this procedure are derived in large part from the available data. Always take into account that the treatment will also be dependent on availability of resources and existing treatment guidelines, considered by other Pain Management Practitioners as being common knowledge and practice, at the time of the intervention. For Medico-Legal purposes, it is also important to point out that variation in procedural techniques and pharmacological choices are the  acceptable norm. The indications, contraindications, technique, and results of the above procedure should only be interpreted and judged by a Board-Certified Interventional Pain Specialist with extensive familiarity and expertise in the same exact procedure and technique.

## 2024-04-24 ENCOUNTER — Ambulatory Visit: Admitting: Pain Medicine

## 2024-04-24 ENCOUNTER — Ambulatory Visit
Admission: RE | Admit: 2024-04-24 | Discharge: 2024-04-24 | Disposition: A | Discharge status: Home / Self Care | Source: Ambulatory Visit | Attending: Pain Medicine | Admitting: Pain Medicine

## 2024-04-24 ENCOUNTER — Encounter: Payer: Self-pay | Admitting: Pain Medicine

## 2024-04-24 VITALS — BP 184/73 | HR 72 | Temp 97.2°F | Resp 16 | Ht 61.0 in | Wt 172.0 lb

## 2024-04-24 DIAGNOSIS — M545 Low back pain, unspecified: Secondary | ICD-10-CM

## 2024-04-24 DIAGNOSIS — M4317 Spondylolisthesis, lumbosacral region: Secondary | ICD-10-CM

## 2024-04-24 DIAGNOSIS — G8929 Other chronic pain: Secondary | ICD-10-CM

## 2024-04-24 DIAGNOSIS — M47816 Spondylosis without myelopathy or radiculopathy, lumbar region: Secondary | ICD-10-CM | POA: Diagnosis present

## 2024-04-24 DIAGNOSIS — M47817 Spondylosis without myelopathy or radiculopathy, lumbosacral region: Secondary | ICD-10-CM | POA: Insufficient documentation

## 2024-04-24 DIAGNOSIS — M5459 Other low back pain: Secondary | ICD-10-CM | POA: Diagnosis present

## 2024-04-24 MED ORDER — PENTAFLUOROPROP-TETRAFLUOROETH EX AERO: INHALATION_SPRAY | Freq: Once | CUTANEOUS | Status: AC

## 2024-04-24 MED ORDER — MIDAZOLAM HCL 2 MG/2ML IJ SOLN
INTRAMUSCULAR | Status: AC
Start: 2024-04-24 — End: 2024-04-24
  Filled 2024-04-24: qty 2

## 2024-04-24 MED ORDER — LIDOCAINE HCL 2 % IJ SOLN
20.0000 mL | Freq: Once | INTRAMUSCULAR | Status: AC
  Administered 2024-04-24: 400 mg

## 2024-04-24 MED ORDER — ROPIVACAINE HCL 2 MG/ML IJ SOLN
18.0000 mL | Freq: Once | INTRAMUSCULAR | Status: AC
  Administered 2024-04-24: 18 mL via PERINEURAL

## 2024-04-24 MED ORDER — LIDOCAINE HCL 2 % IJ SOLN
INTRAMUSCULAR | Status: AC
  Filled 2024-04-24: qty 20

## 2024-04-24 MED ORDER — ROPIVACAINE HCL 2 MG/ML IJ SOLN
INTRAMUSCULAR | Status: AC
  Filled 2024-04-24: qty 20

## 2024-04-24 MED ORDER — MIDAZOLAM HCL (PF) 2 MG/2ML IJ SOLN
0.5000 mg | Freq: Once | INTRAMUSCULAR | Status: AC
  Administered 2024-04-24: 1 mg via INTRAVENOUS

## 2024-04-25 ENCOUNTER — Telehealth: Payer: Self-pay | Admitting: *Deleted

## 2024-04-25 NOTE — Telephone Encounter (Signed)
 No problems post procedure.

## 2024-05-06 ENCOUNTER — Emergency Department: Admission: EM | Admit: 2024-05-06 | Discharge: 2024-05-06 | Disposition: A | Discharge status: Home / Self Care

## 2024-05-06 ENCOUNTER — Other Ambulatory Visit: Payer: Self-pay

## 2024-05-06 DIAGNOSIS — R55 Syncope and collapse: Secondary | ICD-10-CM | POA: Insufficient documentation

## 2024-05-06 DIAGNOSIS — N3000 Acute cystitis without hematuria: Secondary | ICD-10-CM | POA: Diagnosis not present

## 2024-05-06 LAB — URINALYSIS, ROUTINE W REFLEX MICROSCOPIC
Bilirubin Urine: NEGATIVE
Glucose, UA: NEGATIVE mg/dL
Hgb urine dipstick: NEGATIVE
Ketones, ur: NEGATIVE mg/dL
Nitrite: NEGATIVE
Protein, ur: NEGATIVE mg/dL
Specific Gravity, Urine: 1.023 (ref 1.005–1.030)
WBC, UA: >50 WBC/hpf (ref 0–5)
pH: 5 (ref 5.0–8.0)

## 2024-05-06 LAB — COMPREHENSIVE METABOLIC PANEL WITH GFR
ALT: 14 U/L (ref 0–44)
AST: 19 U/L (ref 15–41)
Albumin: 3.8 g/dL (ref 3.5–5.0)
Alkaline Phosphatase: 104 U/L (ref 38–126)
Anion gap: 12 (ref 5–15)
BUN: 15 mg/dL (ref 8–23)
CO2: 26 mmol/L (ref 22–32)
Calcium: 9.3 mg/dL (ref 8.9–10.3)
Chloride: 98 mmol/L (ref 98–111)
Creatinine, Ser: 0.86 mg/dL (ref 0.44–1.00)
GFR, Estimated: >60 mL/min (ref >60)
Glucose, Bld: 170 mg/dL — ABNORMAL HIGH (ref 70–99)
Potassium: 4.7 mmol/L (ref 3.5–5.1)
Sodium: 135 mmol/L (ref 135–145)
Total Bilirubin: 0.3 mg/dL (ref 0.0–1.2)
Total Protein: 6.5 g/dL (ref 6.5–8.1)

## 2024-05-06 LAB — CBC
HCT: 41.6 % (ref 36.0–46.0)
Hemoglobin: 13.1 g/dL (ref 12.0–15.0)
MCH: 27.5 pg (ref 26.0–34.0)
MCHC: 31.5 g/dL (ref 30.0–36.0)
MCV: 87.4 fL (ref 80.0–100.0)
Platelets: 208 K/uL (ref 150–400)
RBC: 4.76 MIL/uL (ref 3.87–5.11)
RDW: 14.6 % (ref 11.5–15.5)
WBC: 7.8 K/uL (ref 4.0–10.5)
nRBC: 0 % (ref 0.0–0.2)

## 2024-05-06 MED ORDER — CEPHALEXIN 500 MG PO CAPS
500.0000 mg | ORAL_CAPSULE | Freq: Once | ORAL | Status: AC
  Administered 2024-05-06: 500 mg via ORAL
  Filled 2024-05-06: qty 1

## 2024-05-06 MED ORDER — CEPHALEXIN 500 MG PO CAPS: 500.0000 mg | ORAL_CAPSULE | Freq: Two times a day (BID) | ORAL | 0 refills | Status: AC

## 2024-05-06 NOTE — ED Triage Notes (Signed)
 First nurse note: pt to ED [facility] from home for near syncope while baking. Reports this happens very frequently and has been seen for same multiple times. VSS. 4mg  zofran  IM PTA

## 2024-05-06 NOTE — ED Provider Notes (Signed)
 [facility] Provider Note    Event Date/Time   First MD Initiated Contact with Patient 05/06/24 2019     (approximate)   History   Near Syncope   HPI  Brandi [name] is a 88 y.o. female with history of multiple syncopal episodes who presents today with concern of a syncopal episode.  Apparently multiple visits here in our emergency department for similar episodes today she was in her kitchen making banana bread and, apparently she was standing on her rollator, her son who was next to her noticed that she passed out, believed to have passed out for a few minutes per the son, EMS arrived initially a firefighter arrived and she passed out while conversing with the firefighter, and then by the time EMS arrived she had returned back to baseline and was conversive again.  Currently she feels well she has no complaints, she denies any neurodeficits no associated chest pain shortness of breath or other symptoms.  She tells me that she has not had her symptomatology extensively worked up with cardiology in the outpatient setting including having echo Holter monitoring and other workup done without clear explanation.  She was seen multiple different specialist who changed her medications without any improvement or clear resolution of symptoms.      Physical Exam   Triage Vital Signs: ED Triage Vitals  Encounter Vitals Group     BP 05/06/24 1823 (!) 166/68     Girls Systolic BP Percentile --      Girls Diastolic BP Percentile --      Boys Systolic BP Percentile --      Boys Diastolic BP Percentile --      Pulse Rate 05/06/24 1823 73     Resp 05/06/24 1823 17     Temp 05/06/24 1823 97.7 F (36.5 C)     Temp Source 05/06/24 1823 Oral     SpO2 05/06/24 1823 99 %     Weight 05/06/24 1826 174 lb (78.9 kg)     Height 05/06/24 1826 5' 1 (1.549 m)     Head Circumference --      Peak Flow --      Pain Score 05/06/24 1823 0     Pain Loc --      Pain Education --       Exclude from Growth Chart --     Most recent vital signs: Vitals:   05/06/24 1823  BP: (!) 166/68  Pulse: 73  Resp: 17  Temp: 97.7 F (36.5 C)  SpO2: 99%     General: Awake, no distress.  CV:  Good peripheral perfusion.  Regular rate and rhythm, no auscultated carotid bruits Resp:  Normal effort.  Able to speak in full sentences Abd:  No distention.  Soft nontender nondistended Other:     ED Results / Procedures / Treatments   Labs (all labs ordered are listed, but only abnormal results are displayed) Labs Reviewed  COMPREHENSIVE METABOLIC PANEL WITH GFR - Abnormal; Notable for the following components:      Result Value   Glucose, Bld 170 (*)    All other components within normal limits  URINALYSIS, ROUTINE W REFLEX MICROSCOPIC - Abnormal; Notable for the following components:   Color, Urine YELLOW (*)    APPearance CLOUDY (*)    Leukocytes,Ua MODERATE (*)    Bacteria, UA FEW (*)    All other components within normal limits  CBC  CBG MONITORING, ED  EKG  Sinus rhythm with rate of about 75, axis of 0, intervals appear to be within normal limits, no obvious ischemia that appreciated on this EKG   RADIOLOGY   PROCEDURES:  Critical Care performed: No  Procedures   MEDICATIONS ORDERED IN ED: Medications  cephALEXin  (KEFLEX ) capsule 500 mg (500 mg Oral Given 05/06/24 2106)     IMPRESSION / MDM / ASSESSMENT AND PLAN / ED COURSE  I reviewed the triage vital signs and the nursing notes.                               Patient's presentation is most consistent with acute, uncomplicated illness.  88 year old female who presents today with concern of a syncopal episode.  Has had multiple evaluations for syncope, seems that it is felt that this is unlikely cardiac related and believe more so related to possible medications or orthostatic cause.  She appears well at this time and she is not in any acute distress.  Blood work also still pending, we will  follow-up on this, and will have the patient ambulated if she is able to ambulate appropriately and she feels well believe would be reasonable for discharge home, could also consider admission given her recurrent syncopal episodes without clear explanation, but will discuss with her and determine safe disposition accordingly.   Clinical Course as of 05/06/24 2357  Tue May 06, 2024  2053 Patient labs demonstrating evidence of urinary tract infection but otherwise reassuring without clear explanation of symptoms.  She was able to ambulate here without recurrence of symptoms and she otherwise feels well.  I did shared decision making, and discussed admission for observation, I explained risk benefits of possible cardiac arrhythmias or cardiac death as a possibility for the patient's symptomatology.  She verbalized understanding, she has had extensive workup with cardiology, and she is open to following up with them in the outpatient setting, and she would prefer to be discharged home.  Will have her discharged at this time. [SK]    Clinical Course User Index [SK] Fernand Rossie HERO, MD     FINAL CLINICAL IMPRESSION(S) / ED DIAGNOSES   Final diagnoses:  Near syncope  Acute cystitis without hematuria     Rx / DC Orders   ED Discharge Orders          Ordered    cephALEXin  (KEFLEX ) 500 MG capsule  2 times daily        05/06/24 2055    Ambulatory referral to Cardiology       Comments: If you have not heard from the Cardiology office within the next 72 hours please call [phone].   05/06/24 2057             Note:  This document was prepared using Dragon voice recognition software and may include unintentional dictation errors.   Fernand Rossie HERO, MD 05/07/24 [phone]

## 2024-05-06 NOTE — Discharge Instructions (Signed)
 You were seen today due to concern of an episode of passing out.  Fortunately at this time your labs are reassuring, aside from a urinary infection that we have noted.  I have started you on some antibiotics, please take these as instructed.  If you have any worsening of symptoms such as fevers, chills, recurring episodes of lightheadedness, or any other symptoms you find concerning please return to the emergency department immediately for further medical management.  Otherwise please be sure to follow-up with your cardiologist to discuss further workup and management of your symptoms.

## 2024-05-06 NOTE — ED Triage Notes (Signed)
 Pt arrives via [facility] with c/o a near syncopal episode while baking banana bread at home. Pt was on the phone with their son and she stopped answering his questions and went out. Pt has a hx of syncopal episodes and has orthostatic hypertension. Pt is A&Ox4 during triage.

## 2024-05-07 NOTE — ED Provider Notes (Incomplete)
 [facility] Provider Note    Event Date/Time   First MD Initiated Contact with Patient 05/06/24 2019     (approximate)   History   Near Syncope   HPI  Brandi [name] is a 88 y.o. female  ***       Physical Exam   Triage Vital Signs: ED Triage Vitals  Encounter Vitals Group     BP 05/06/24 1823 (!) 166/68     Girls Systolic BP Percentile --      Girls Diastolic BP Percentile --      Boys Systolic BP Percentile --      Boys Diastolic BP Percentile --      Pulse Rate 05/06/24 1823 73     Resp 05/06/24 1823 17     Temp 05/06/24 1823 97.7 F (36.5 C)     Temp Source 05/06/24 1823 Oral     SpO2 05/06/24 1823 99 %     Weight 05/06/24 1826 174 lb (78.9 kg)     Height 05/06/24 1826 5' 1 (1.549 m)     Head Circumference --      Peak Flow --      Pain Score 05/06/24 1823 0     Pain Loc --      Pain Education --      Exclude from Growth Chart --     Most recent vital signs: Vitals:   05/06/24 1823  BP: (!) 166/68  Pulse: 73  Resp: 17  Temp: 97.7 F (36.5 C)  SpO2: 99%     General: Awake, no distress. *** CV:  Good peripheral perfusion. *** Resp:  Normal effort. *** Abd:  No distention. *** Other:     ED Results / Procedures / Treatments   Labs (all labs ordered are listed, but only abnormal results are displayed) Labs Reviewed  COMPREHENSIVE METABOLIC PANEL WITH GFR - Abnormal; Notable for the following components:      Result Value   Glucose, Bld 170 (*)    All other components within normal limits  URINALYSIS, ROUTINE W REFLEX MICROSCOPIC - Abnormal; Notable for the following components:   Color, Urine YELLOW (*)    APPearance CLOUDY (*)    Leukocytes,Ua MODERATE (*)    Bacteria, UA FEW (*)    All other components within normal limits  CBC  CBG MONITORING, ED     EKG  ***   RADIOLOGY ***  PROCEDURES:  Critical Care performed: {CriticalCareYesNo:19197::Yes, see critical care procedure  note(s),No}  Procedures   MEDICATIONS ORDERED IN ED: Medications  cephALEXin  (KEFLEX ) capsule 500 mg (500 mg Oral Given 05/06/24 2106)     IMPRESSION / MDM / ASSESSMENT AND PLAN / ED COURSE  I reviewed the triage vital signs and the nursing notes.                               Patient's presentation is most consistent with {EM COPA:27473}  ***   Clinical Course as of 05/06/24 2357  Tue May 06, 2024  2053 Patient labs demonstrating evidence of urinary tract infection but otherwise reassuring without clear explanation of symptoms.  She was able to ambulate here without recurrence of symptoms and she otherwise feels well.  I did shared decision making, and discussed admission for observation, I explained risk benefits of possible cardiac arrhythmias or cardiac death as a possibility for the patient's symptomatology.  She verbalized understanding, she has had  extensive workup with cardiology, and she is open to following up with them in the outpatient setting, and she would prefer to be discharged home.  Will have her discharged at this time. [SK]    Clinical Course User Index [SK] Fernand Rossie HERO, MD     FINAL CLINICAL IMPRESSION(S) / ED DIAGNOSES   Final diagnoses:  Near syncope  Acute cystitis without hematuria     Rx / DC Orders   ED Discharge Orders          Ordered    cephALEXin  (KEFLEX ) 500 MG capsule  2 times daily        05/06/24 2055    Ambulatory referral to Cardiology       Comments: If you have not heard from the Cardiology office within the next 72 hours please call [phone].   05/06/24 2057             Note:  This document was prepared using Dragon voice recognition software and may include unintentional dictation errors.

## 2024-05-18 NOTE — Progress Notes (Unsigned)
 PROVIDER NOTE: Interpretation of information contained herein should be left to medically-trained personnel. Specific patient instructions are provided elsewhere under Patient Instructions section of medical record. This document was created in part using AI and STT-dictation technology, any transcriptional errors that may result from this process are unintentional.  Patient: Brandi [name]  Service: E/M   PCP: Rudolpho Norleen BIRCH, MD  DOB: [birth year]  DOS: 05/19/2024  Provider: Eric DELENA Como, MD  MRN: [id number]  Delivery: Face-to-face  Specialty: Interventional Pain Management  Type: Established Patient  Setting: Ambulatory outpatient facility  Specialty designation: 09  Referring Prov.: Rudolpho Norleen BIRCH, MD  Location: Outpatient office facility       History of present illness (HPI) Ms. Brandi [name], a 88 y.o. year old female, is here today because of her Chronic bilateral low back pain without sciatica [M54.50, G89.29]. Ms. Brandi [name] primary complain today is No chief complaint on file.  Pertinent problems: Ms. Brandi [name] has Headache disorder; Diabetic neuropathy (HCC); Abdominal pain; Sacroiliitis; Chronic pain syndrome; Abnormal CT scan, cervical spine (07/24/2021); Abnormal MRI, lumbar spine (05/09/2023); Chronic low back pain (1ry area of Pain) (Bilateral) (R>L) w/ sciatica (Right); Low back pain radiating to leg (Right); Pain of lateral upper thigh (Right) (Intermittent); Chronic sacroiliac joint pain (Right); Somatic dysfunction of sacroiliac joint (Right); Grade 1 Anterolisthesis of lumbosacral spine of L3/L4 and L5/S1; Lumbar facet joint pain; Chronic lower extremity pain (2ry area of Pain) (Right); Lumbosacral radiculopathy at L5 (Right); Lumbosacral radiculopathy at S1 (Right); DDD, lumbosacral w/ LBP & LEP; Lumbar facet hypertrophy (Multilevel) (Bilateral); Lumbar foraminal stenosis; Chronic low back pain (Right) w/o sciatica; Lumbar facet joint syndrome; Spondylosis without myelopathy or  radiculopathy, lumbosacral region; SIRS (systemic inflammatory response syndrome) (HCC); Abnormal NCS (nerve conduction studies) (03/07/2021); Polyneuropathy, peripheral sensorimotor axonal; Diabetic sensorimotor polyneuropathy (HCC); Chronic painful diabetic neuropathy (HCC); Type 2 diabetes mellitus with peripheral neuropathy (HCC); Headache; Chronic low back pain (Bilateral) w/o sciatica; Low back pain of over 3 months duration; and Multifactorial low back pain on their pertinent problem list.  Pain Assessment: Severity of   is reported as a  /10. Location:    / . Onset:  . Quality:  . Timing:  . Modifying factor(s):  SABRA Vitals:  vitals were not taken for this visit.  BMI: Estimated body mass index is 32.88 kg/m as calculated from the following:   Height as of 05/06/24: 5' 1 (1.549 m).   Weight as of 05/06/24: 174 lb (78.9 kg).  Last encounter: 02/11/2024. Last procedure: 04/24/2024.  Reason for encounter: post-procedure evaluation and assessment.   Discussed the use of AI scribe software for clinical note transcription with the patient, who gave verbal consent to proceed.  History of Present Illness          Post-Procedure Evaluation   Type: Lumbar Facet, Medial Branch Block(s) (w/ fluoroscopic mapping) #1 (WITHOUT STEROIDS) Laterality: Bilateral  Level: L2, L3, L4, L5, and S1 Medial Branch/Dorsal Rami Level(s). Injecting these levels blocks the L3-4, L4-5, and L5-S1 lumbar facet joints. Imaging: Fluoroscopic guidance Spinal ([id number]) Anesthesia: Local anesthesia (1-2% Lidocaine ) Anxiolysis: IV Versed  1.0 mg Sedation: No Sedation                       DOS: 04/24/2024 Performed by: Eric DELENA Como, MD  Primary Purpose: Diagnostic/Therapeutic Indications: Low back pain severe enough to impact quality of life or function. 1. Chronic low back pain (Bilateral) w/o sciatica   2. Grade 1 Anterolisthesis of lumbosacral spine of  L3/L4 and L5/S1   3. Lumbar facet joint pain   4.  Lumbar facet hypertrophy (Multilevel) (Bilateral)   5. Lumbar facet joint syndrome   6. Spondylosis without myelopathy or radiculopathy, lumbosacral region   7. Low back pain of over 3 months duration   8. Multifactorial low back pain    NAS-11 Pain score:   Pre-procedure: 6/10   Post-procedure: 0-No pain/10     Effectiveness:  Initial hour after procedure:   ***. Subsequent 4-6 hours post-procedure:   ***. Analgesia past initial 6 hours:   ***. Ongoing improvement:  Analgesic:  *** Function:    ***    ROM:    ***    Interpretation: ***  Pharmacotherapy Assessment   Opioid Analgesic: No chronic opioid analgesics therapy prescribed by our practice. None MME/day: 0 mg/day   Monitoring: [place] PMP: PDMP reviewed during this encounter.       Pharmacotherapy: No side-effects or adverse reactions reported. Compliance: No problems identified. Effectiveness: Clinically acceptable.  No notes on file  UDS:  No results found for: SUMMARY  No results found for: CBDTHCR No results found for: D8THCCBX No results found for: D9THCCBX  ROS  Constitutional: Denies any fever or chills Gastrointestinal: No reported hemesis, hematochezia, vomiting, or acute GI distress Musculoskeletal: Denies any acute onset joint swelling, redness, loss of ROM, or weakness Neurological: No reported episodes of acute onset apraxia, aphasia, dysarthria, agnosia, amnesia, paralysis, loss of coordination, or loss of consciousness  Medication Review  Cyanocobalamin , Melatonin, PreserVision/Lutein , SitaGLIPtin -MetFORMIN  HCl, acetaminophen , amLODipine , aspirin  EC, atorvastatin , budesonide-formoterol , carboxymethylcellulose, carvedilol , cephALEXin , ferrous sulfate , furosemide , gabapentin , insulin  glargine, ipratropium, losartan , mirabegron  ER, nitroGLYCERIN , progesterone , sodium chloride , and spironolactone   History Review  Allergy: Ms. Brandi [name] is allergic to accupril [quinapril hcl], micardis [telmisartan],  and quinapril. Drug: Ms. Brandi [name]  reports no history of drug use. Alcohol :  reports no history of alcohol  use. Tobacco:  reports that she has never smoked. She has never used smokeless tobacco. Social: Ms. Brandi [name]  reports that she has never smoked. She has never used smokeless tobacco. She reports that she does not drink alcohol  and does not use drugs. Medical:  has a past medical history of Acquired cyst of kidney, Anemia, Anxiety, Aortic atherosclerosis, Arthritis, degenerative, Atrial fibrillation and flutter (HCC), Baden-Walker grade 3 cystocele, Chronic cystitis, Coronary artery disease (08/05/2010), Depression, Diabetic neuropathy (HCC), Dyspnea, GERD (gastroesophageal reflux disease), Headache, History of left atrial appendage closure (05/29/2018), HLD (hyperlipidemia), HTN (hypertension), Implantable loop recorder present (09/20/2016), Insomnia, Murmur, NSTEMI (non-ST elevated myocardial infarction) (HCC) (05/20/2018), NSVT (nonsustained ventricular tachycardia) (HCC) (01/2022), Obesity, OSA on CPAP, PONV (postoperative nausea and vomiting), Presence of drug coated stent in LAD coronary artery, PSVT (paroxysmal supraventricular tachycardia) (01/2022), 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 Surgical: Ms. Brandi [name]  has a past surgical history that includes Cholecystectomy; Total knee arthroplasty (Bilateral); Septoplasty; Bunionectomy; LOOP RECORDER INSERTION (N/A, 09/20/2016); Colonoscopy with propofol  (N/A, 06/14/2017); Breast biopsy (Right, 2004); LEFT HEART CATH AND CORONARY ANGIOGRAPHY (N/A, 05/22/2018); XI Robotic assisted inguinal hernia repair with mesh (Left, 10/14/2020); Coronary artery bypass graft (N/A, 05/29/2018); LEFT ATRIAL APPENDAGE OCCLUSION (Left, 05/29/2018); Coronary angioplasty with stent (Left, 08/05/2010); LEFT HEART CATH AND CORONARY  ANGIOGRAPHY (Left, 07/03/2011); LEFT HEART CATH AND CORONARY ANGIOGRAPHY (Left, 05/22/2018); and Hysteroscopy with D & C (N/A, 12/14/2022). Family: family history includes Bladder Cancer in her maternal aunt; Breast cancer in her cousin; COPD in an  other family member; Diabetes in an other family member; Hypercholesterolemia in an other family member; Hypertension in an other family member; Skin cancer in an other family member.  Laboratory Chemistry Profile   Renal Lab Results  Component Value Date   BUN 15 05/06/2024   CREATININE 0.86 05/06/2024   LABCREA 29 04/13/2020   GFRAA 54 (L) 05/24/2018   GFRNONAA >60 05/06/2024    Hepatic Lab Results  Component Value Date   AST 19 05/06/2024   ALT 14 05/06/2024   ALBUMIN 3.8 05/06/2024   ALKPHOS 104 05/06/2024   LIPASE 22 10/27/2023    Electrolytes Lab Results  Component Value Date   NA 135 05/06/2024   K 4.7 05/06/2024   CL 98 05/06/2024   CALCIUM  9.3 05/06/2024   MG 1.8 03/29/2024   PHOS 1.7 (L) 11/19/2023    Bone Lab Results  Component Value Date   25OHVITD1 68 08/15/2023   25OHVITD2 <1.0 08/15/2023   25OHVITD3 68 08/15/2023    Inflammation (CRP: Acute Phase) (ESR: Chronic Phase) Lab Results  Component Value Date   CRP 2 08/15/2023   ESRSEDRATE 2 08/15/2023   LATICACIDVEN 1.1 11/17/2023         Note: Above Lab results reviewed.  Recent Imaging Review  DG PAIN CLINIC C-ARM 1-60 MIN NO REPORT Fluoro was used, but no Radiologist interpretation will be provided.  Please refer to NOTES tab for provider progress note. Note: Reviewed        Physical Exam  Vitals: There were no vitals taken for this visit. BMI: Estimated body mass index is 32.88 kg/m as calculated from the following:   Height as of 05/06/24: 5' 1 (1.549 m).   Weight as of 05/06/24: 174 lb (78.9 kg). Ideal: Ideal body weight: 47.8 kg (105 lb 6.1 oz) Adjusted ideal body weight: 60.2 kg (132 lb 13.2 oz) General appearance: Well nourished, well  developed, and well hydrated. In no apparent acute distress Mental status: Alert, oriented x 3 (person, place, & time)       Respiratory: No evidence of acute respiratory distress Eyes: PERLA   Assessment   Diagnosis Status  1. Chronic low back pain (Bilateral) w/o sciatica   2. Lumbar facet joint pain   3. Lumbar facet joint syndrome   4. Low back pain of over 3 months duration   5. Chronic low back pain (Right) w/o sciatica   6. Low back pain radiating to leg (Right)   7. Postop check    Controlled Controlled Controlled   Updated Problems: No problems updated.  Plan of Care  Problem-specific:  Assessment and Plan            Ms. MANNAT BENEDETTI has a current medication list which includes the following long-term medication(s): amlodipine , budesonide-formoterol , carvedilol , gabapentin , lantus  solostar, ipratropium, losartan , mirabegron  er, progesterone , sodium chloride , and spironolactone .  Pharmacotherapy (Medications Ordered): No orders of the defined types were placed in this encounter.  Orders:  No orders of the defined types were placed in this encounter.    Interventional Therapies  Risk Factors  Considerations  Medical Comorbidities:  T2IDDM  MO (BMI>35)  CAD s/p CABG x 3  Hx A-Fib/Flutter  Hx. Orthostatic Hypotension  Stage 3 CKD  GERD  HTN  Cystocele Grade 3  Incontinence     Planned  Pending:   Diagnostic bilateral lumbar facet (L2-S1) MBB #1    Under consideration:   Diagnostic right sacroiliac joint BLK #2  Diagnostic right lumbar facet MBB #2  Completed:   Diagnostic right SI joint Blk x1 (08/21/2023) (100/100/0/0) (Requested by referring NS: Dr. CHRISTELLA. CHRISTELLA. Abd-El-Barr ) Diagnostic/therapeutic right L4-5 LESI x1 (10/16/2023) (0/100/25 x 2 weeks/LBP:0LEP:100)  Diagnostic right lumbar facet (L2-S1) MBB x1 (11/13/2023) (Undetermined) (No useful information from patient)   Therapeutic  Palliative (PRN) options:   Diagnostic (L)LE NCT  (EMG/PNCV) (03/07/2021) by Arthea Farrow, MD ([facility]) Dx: Electrodiagnostic evidence of chronic severe sensorimotor polyneuropathy.   Completed by other providers:   Therapeutic right L4-5 TFESI x3 (05/24/2017, 06/29/2017, 08/30/2017) by Morene Falcon, DO [facility])  Therapeutic right SI joint inj. x4 (09/28/2016, 02/20/2017, 12/04/2017, 01/24/2018) by Morene Falcon, DO [facility])  Diagnostic left IA hip joint inj. x1 (06/10/2015) by Morene Falcon, DO ([facility])       No follow-ups on file.    Recent Visits Date Type Provider Dept  04/24/24 Procedure visit Tanya Glisson, MD [facility]  Showing recent visits within past 90 days and meeting all other requirements Future Appointments Date Type Provider Dept  05/19/24 Appointment Tanya Glisson, MD [facility]  Showing future appointments within next 90 days and meeting all other requirements  I discussed the assessment and treatment plan with the patient. The patient was provided an opportunity to ask questions and all were answered. The patient agreed with the plan and demonstrated an understanding of the instructions.  Patient advised to call back or seek an in-person evaluation if the symptoms or condition worsens.  Duration of encounter: *** minutes.  Total time on encounter, as per AMA guidelines included both the face-to-face and non-face-to-face time personally spent by the physician and/or other qualified health care professional(s) on the day of the encounter (includes time in activities that require the physician or other qualified health care professional and does not include time in activities normally performed by clinical staff). Physician's time may include the following activities when performed: Preparing to see the patient (e.g., pre-charting review of records, searching for previously ordered imaging, lab work, and nerve conduction tests) Review of prior analgesic  pharmacotherapies. Reviewing PMP Interpreting ordered tests (e.g., lab work, imaging, nerve conduction tests) Performing post-procedure evaluations, including interpretation of diagnostic procedures Obtaining and/or reviewing separately obtained history Performing a medically appropriate examination and/or evaluation Counseling and educating the patient/family/caregiver Ordering medications, tests, or procedures Referring and communicating with other health care professionals (when not separately reported) Documenting clinical information in the electronic or other health record Independently interpreting results (not separately reported) and communicating results to the patient/ family/caregiver Care coordination (not separately reported)  Note by: Glisson DELENA Tanya, MD (TTS and AI technology used. I apologize for any typographical errors that were not detected and corrected.) Date: 05/19/2024; Time: 10:38 AM

## 2024-05-19 ENCOUNTER — Encounter: Payer: Self-pay | Admitting: Pain Medicine

## 2024-05-19 ENCOUNTER — Ambulatory Visit: Attending: Pain Medicine | Admitting: Pain Medicine

## 2024-05-19 VITALS — BP 224/82 | HR 74 | Temp 98.2°F | Resp 16 | Ht 66.0 in | Wt 176.0 lb

## 2024-05-19 DIAGNOSIS — Z09 Encounter for follow-up examination after completed treatment for conditions other than malignant neoplasm: Secondary | ICD-10-CM | POA: Diagnosis present

## 2024-05-19 DIAGNOSIS — M79604 Pain in right leg: Secondary | ICD-10-CM | POA: Diagnosis present

## 2024-05-19 DIAGNOSIS — M545 Low back pain, unspecified: Secondary | ICD-10-CM | POA: Insufficient documentation

## 2024-05-19 DIAGNOSIS — G8929 Other chronic pain: Secondary | ICD-10-CM | POA: Insufficient documentation

## 2024-05-19 DIAGNOSIS — M5459 Other low back pain: Secondary | ICD-10-CM | POA: Insufficient documentation

## 2024-05-19 DIAGNOSIS — M47816 Spondylosis without myelopathy or radiculopathy, lumbar region: Secondary | ICD-10-CM | POA: Diagnosis present

## 2024-05-19 NOTE — Progress Notes (Signed)
 Safety precautions to be maintained throughout the outpatient stay will include: orient to surroundings, keep bed in low position, maintain call bell within reach at all times, provide assistance with transfer out of bed and ambulation.    Bp increased took x3 systolic over 200, patient states they have been rushing and medication decreased and MD watching.

## 2024-05-19 NOTE — Patient Instructions (Signed)

## 2024-05-20 ENCOUNTER — Other Ambulatory Visit: Payer: Self-pay

## 2024-05-20 ENCOUNTER — Emergency Department: Admission: EM | Admit: 2024-05-20 | Discharge: 2024-05-20 | Disposition: A | Discharge status: Home / Self Care

## 2024-05-20 DIAGNOSIS — I10 Essential (primary) hypertension: Secondary | ICD-10-CM | POA: Diagnosis not present

## 2024-05-20 DIAGNOSIS — R55 Syncope and collapse: Secondary | ICD-10-CM | POA: Diagnosis present

## 2024-05-20 DIAGNOSIS — I2510 Atherosclerotic heart disease of native coronary artery without angina pectoris: Secondary | ICD-10-CM | POA: Insufficient documentation

## 2024-05-20 DIAGNOSIS — E119 Type 2 diabetes mellitus without complications: Secondary | ICD-10-CM | POA: Insufficient documentation

## 2024-05-20 DIAGNOSIS — I4891 Unspecified atrial fibrillation: Secondary | ICD-10-CM | POA: Insufficient documentation

## 2024-05-20 LAB — PROTIME-INR
INR: 1 (ref 0.8–1.2)
Prothrombin Time: 13.8 s (ref 11.4–15.2)

## 2024-05-20 LAB — URINALYSIS, COMPLETE (UACMP) WITH MICROSCOPIC
Bilirubin Urine: NEGATIVE
Glucose, UA: NEGATIVE mg/dL
Hgb urine dipstick: NEGATIVE
Ketones, ur: NEGATIVE mg/dL
Leukocytes,Ua: NEGATIVE
Nitrite: NEGATIVE
Protein, ur: NEGATIVE mg/dL
Specific Gravity, Urine: 1.006 (ref 1.005–1.030)
pH: 6 (ref 5.0–8.0)

## 2024-05-20 LAB — COMPREHENSIVE METABOLIC PANEL WITH GFR
ALT: 13 U/L (ref 0–44)
AST: 20 U/L (ref 15–41)
Albumin: 3.5 g/dL (ref 3.5–5.0)
Alkaline Phosphatase: 86 U/L (ref 38–126)
Anion gap: 12 (ref 5–15)
BUN: 14 mg/dL (ref 8–23)
CO2: 24 mmol/L (ref 22–32)
Calcium: 8.9 mg/dL (ref 8.9–10.3)
Chloride: 101 mmol/L (ref 98–111)
Creatinine, Ser: 0.98 mg/dL (ref 0.44–1.00)
GFR, Estimated: 56 mL/min — ABNORMAL LOW (ref >60)
Glucose, Bld: 250 mg/dL — ABNORMAL HIGH (ref 70–99)
Potassium: 4.2 mmol/L (ref 3.5–5.1)
Sodium: 137 mmol/L (ref 135–145)
Total Bilirubin: 0.4 mg/dL (ref 0.0–1.2)
Total Protein: 6.1 g/dL — ABNORMAL LOW (ref 6.5–8.1)

## 2024-05-20 LAB — CBC WITH DIFFERENTIAL/PLATELET
Abs Immature Granulocytes: 0.02 K/uL (ref 0.00–0.07)
Basophils Absolute: 0 K/uL (ref 0.0–0.1)
Basophils Relative: 1 %
Eosinophils Absolute: 0.3 K/uL (ref 0.0–0.5)
Eosinophils Relative: 5 %
HCT: 38.9 % (ref 36.0–46.0)
Hemoglobin: 12.2 g/dL (ref 12.0–15.0)
Immature Granulocytes: 0 %
Lymphocytes Relative: 31 %
Lymphs Abs: 1.8 K/uL (ref 0.7–4.0)
MCH: 27.5 pg (ref 26.0–34.0)
MCHC: 31.4 g/dL (ref 30.0–36.0)
MCV: 87.8 fL (ref 80.0–100.0)
Monocytes Absolute: 0.5 K/uL (ref 0.1–1.0)
Monocytes Relative: 8 %
Neutro Abs: 3.3 K/uL (ref 1.7–7.7)
Neutrophils Relative %: 55 %
Platelets: 181 K/uL (ref 150–400)
RBC: 4.43 MIL/uL (ref 3.87–5.11)
RDW: 14.6 % (ref 11.5–15.5)
WBC: 6 K/uL (ref 4.0–10.5)
nRBC: 0 % (ref 0.0–0.2)

## 2024-05-20 LAB — TROPONIN T, HIGH SENSITIVITY
Troponin T High Sensitivity: 28 ng/L — ABNORMAL HIGH (ref 0–19)
Troponin T High Sensitivity: 24 ng/L — ABNORMAL HIGH (ref 0–19)

## 2024-05-20 LAB — D-DIMER, QUANTITATIVE: D-Dimer, Quant: 1.02 ug{FEU}/mL — ABNORMAL HIGH (ref 0.00–0.50)

## 2024-05-20 MED ORDER — HYDRALAZINE HCL 20 MG/ML IJ SOLN
5.0000 mg | Freq: Once | INTRAMUSCULAR | Status: AC
  Administered 2024-05-20: 5 mg via INTRAVENOUS
  Filled 2024-05-20: qty 1

## 2024-05-20 MED ORDER — HYDRALAZINE HCL 20 MG/ML IJ SOLN
10.0000 mg | Freq: Once | INTRAMUSCULAR | Status: AC
  Administered 2024-05-20: 10 mg via INTRAVENOUS
  Filled 2024-05-20: qty 1

## 2024-05-20 MED ORDER — CARVEDILOL 6.25 MG PO TABS
12.5000 mg | ORAL_TABLET | Freq: Once | ORAL | Status: AC
  Administered 2024-05-20: 12.5 mg via ORAL
  Filled 2024-05-20: qty 2

## 2024-05-20 MED ORDER — LOSARTAN POTASSIUM 50 MG PO TABS
100.0000 mg | ORAL_TABLET | Freq: Once | ORAL | Status: AC
  Administered 2024-05-20: 100 mg via ORAL
  Filled 2024-05-20: qty 2

## 2024-05-20 NOTE — ED Notes (Signed)
 Called CCMD to add pt to monitoring.

## 2024-05-20 NOTE — ED Provider Notes (Signed)
 [facility] Provider Note    None    (approximate)   History   Loss of Consciousness  Pt arrives from home via [facility] with c/o near syncopal episode while wrapping presents with no known fall or injuries. Per EMS, pt has a hx of syncopal episodes; pt's initial BP was 98/40 while sitting, pt was pale with c/o nausea, and recently had a UTI and completed antibiotics one week ago. Pt's BP is 212/78 (109) at this time. Pt c/o headache 1/10. Pt reports SOB. Pt denies being light headed or dizzy. Pt states that she has a echocardiogram scheduled tomorrow. Pt is A&Ox4 at this time. Pt is NAD at this time.     HPI Brandi [name] is a 88 y.o. female PMH multiple medical comorbidities including recurrent syncope, CAD status post CABG, DM2 with neuropathy, OSA on CPAP, chronic pain syndrome, paroxysmal A-fib not on anticoagulation, PSVT presents for evaluation of a syncopal episode - Patient had a loss of consciousness episode around 2 PM today.  Occurred while seated, no associated trauma.  Unconscious for several minutes.  Similar to multiple prior episodes as recently as this past month.  Was feeling vaguely lightheaded prior.  Denies any chest pain or shortness of breath preceding this.  Is taking all of her medications as prescribed.  Chart review, last seen in our emergency department on 11/18 for near syncopal episode as well as 10/11 for syncopal episode.  Subsequently seen in cardiology clinic on 05/09/2024.  Workups with concern for UTI, treated with antibiotics.  Noted to have had spironolactone  discontinued earlier in the month and losartan  and Coreg  halved.     Physical Exam   Triage Vital Signs: BP (!) 191/67   Pulse 82   Temp (!) 97.5 F (36.4 C) (Oral)   Resp 13   Ht 5' 1 (1.549 m)   Wt 81.2 kg   SpO2 99%   BMI 33.82 kg/m    Most recent vital signs: Vitals:   05/20/24 1827 05/20/24 1840  BP: (!) 200/83 (!) 191/67  Pulse: 86 82  Resp:  13  Temp:     SpO2:  99%    General: Awake, no distress.  HEENT: Normocephalic, atraumatic CV:  Good peripheral perfusion. RRR, RP 2+ Resp:  Normal effort. CTAB Abd:  No distention. Nontender to deep palpation throughout Neuro:  Alert, oriented, face symmetric, moving all extremities spontaneously, no focal motor deficit appreciated   ED Results / Procedures / Treatments   Labs (all labs ordered are listed, but only abnormal results are displayed) Labs Reviewed  COMPREHENSIVE METABOLIC PANEL WITH GFR - Abnormal; Notable for the following components:      Result Value   Glucose, Bld 250 (*)    Total Protein 6.1 (*)    GFR, Estimated 56 (*)    All other components within normal limits  URINALYSIS, COMPLETE (UACMP) WITH MICROSCOPIC - Abnormal; Notable for the following components:   Color, Urine YELLOW (*)    APPearance CLEAR (*)    Bacteria, UA FEW (*)    All other components within normal limits  D-DIMER, QUANTITATIVE - Abnormal; Notable for the following components:   D-Dimer, Quant 1.02 (*)    All other components within normal limits  TROPONIN T, HIGH SENSITIVITY - Abnormal; Notable for the following components:   Troponin T High Sensitivity 28 (*)    All other components within normal limits  TROPONIN T, HIGH SENSITIVITY - Abnormal; Notable for the following components:  Troponin T High Sensitivity 24 (*)    All other components within normal limits  CBC WITH DIFFERENTIAL/PLATELET  PROTIME-INR     EKG  Ecg = sinus rhythm, rate 67, no gross ST elevation or depression, no significant repolarization abnormality, normal axis, normal intervals.  No clear evidence of ischemia nor arrhythmia on my interpretation.   RADIOLOGY N/a    PROCEDURES:  Critical Care performed: No  Procedures   MEDICATIONS ORDERED IN ED: Medications  hydrALAZINE  (APRESOLINE ) injection 10 mg (10 mg Intravenous Given 05/20/24 1620)  hydrALAZINE  (APRESOLINE ) injection 5 mg (5 mg Intravenous Given  05/20/24 1826)  carvedilol  (COREG ) tablet 12.5 mg (12.5 mg Oral Given 05/20/24 1827)  losartan  (COZAAR ) tablet 100 mg (100 mg Oral Given 05/20/24 1828)     IMPRESSION / MDM / ASSESSMENT AND PLAN / ED COURSE  I reviewed the triage vital signs and the nursing notes.                              DDX/MDM/AP: Differential diagnosis includes, but is not limited to, recurrent syncope somewhat unclear etiology--does have an outpatient echocardiogram scheduled for tomorrow coordination with her cardiologist for further evaluation of this.  Was reportedly mildly hypotensive on scene though is notably hypertensive with a systolic in the 220s during my eval.  Fortunately asymptomatic at this time but given degree of hypertension will require some control if persistent.  Consider transient arrhythmia, doubt underlying electrolyte abnormality.  Doubt PE.  No headache, no neurologic abnormalities, do not suspect intracranial pathology, recent unremarkable CT head imaging, known history of recurrent syncopal episodes.  Plan: - Labs  -EKG - Chest x-ray - Blood pressure control - Cardiac monitor - Reassess.  If unremarkable workup, anticipate discharge home for close outpatient cardiology follow-up which patient prefers as opposed to admission.  Patient's presentation is most consistent with acute presentation with potential threat to life or bodily function.  The patient is on the cardiac monitor to evaluate for evidence of arrhythmia and/or significant heart rate changes.  ED course below.  Workup overall reassuring including stable serial troponins.  No arrhythmias here, no recurrence of syncopal episodes.  Did remain notably hypertensive here.  Medications had recently been decreased because there was concerned about orthostasis, will put her losartan  back to prior but leave the other changes of discontinuation of spironolactone  and decrease of carvedilol  to 12.5 mg twice daily same.  Loaded with her  nightly oral antihypertensives in addition to IV hydralazine  here.  Has echocardiogram tomorrow, plan for close outpatient primary and cardiology follow-up, prefers this over admission which I believe is reasonable.  Discharged home with family.  ED return precautions in place.  Patient agrees with plan.  Clinical Course as of 05/20/24 1847  Tue May 20, 2024  1731 UA w/ no e/o infxn [MM]  1731 Troponin very mildly elevated, nonspecific, will trend  No complaint of chest pain [MM]  1731 CBC, CMP reviewed, unremarkable [MM]  1732 Ecg = sinus rhythm, rate 67, no gross ST elevation or depression, no significant repolarization normality, normal axis, normal intervals.  No evidence of ischemia nor arrhythmia on my interpretation. [MM]  1755 Age-adjusted D-dimer normal  Repeat trop pending [MM]  1808 Rpt trop stable/downtrending [MM]  1819 Patient reevaluated, remains feeling very well here, no recurrence of symptoms.  Systolic blood pressure 200 with a diastolic of about 80 on my recheck.  Asymptomatic.  Appears that earlier this month her  carvedilol  was cut in half to 12.5 twice daily and her losartan  was also cut in half to 50 mg nightly.  They also stopped her spironolactone .  Heart rate in the 70s-80s here, we will keep carvedilol  at 12.5 will increase her losartan .  Will give 1 more small dose of hydralazine  as well but is otherwise stable for discharge home with close outpatient follow-up.  Has echocardiogram tomorrow.  Preferred this as opposed to admission which I believe is reasonable in this patient with long history of syncope with overall reassuring workup here.  ED return precautions in place.  Patient agrees with plan. [MM]  1847 BP(!): 191/67 Blood pressure appropriately downtrending, loaded with oral home meds including increased losartan  dose, will proceed with discharge [MM]    Clinical Course User Index [MM] Clarine Ozell LABOR, MD     FINAL CLINICAL IMPRESSION(S) / ED DIAGNOSES    Final diagnoses:  Syncope, unspecified syncope type  Hypertension, unspecified type     Rx / DC Orders   ED Discharge Orders     None        Note:  This document was prepared using Dragon voice recognition software and may include unintentional dictation errors.   Clarine Ozell LABOR, MD 05/20/24 [phone]

## 2024-05-20 NOTE — ED Triage Notes (Addendum)
 Pt arrives from home via [facility] with c/o near syncopal episode while wrapping presents with no known fall or injuries. Per EMS, pt has a hx of syncopal episodes; pt's initial BP was 98/40 while sitting, pt was pale with c/o nausea, and recently had a UTI and completed antibiotics one week ago. Pt's BP is 212/78 (109) at this time. Pt c/o headache 1/10. Pt reports SOB. Pt denies being light headed or dizzy. Pt states that she has a echocardiogram scheduled tomorrow. Pt is A&Ox4 at this time. Pt is NAD at this time.

## 2024-05-20 NOTE — ED Notes (Signed)
 MD made aware BP 237/83 at this time.

## 2024-05-20 NOTE — Discharge Instructions (Signed)
 Dilation in the emergency department was overall reassuring.  As discussed, your blood pressure was quite elevated here.  I recommend returning to your prior dose of losartan  (1 tablet / 100 mg) nightly and leaving the rest of your blood pressure regimen unchanged.  Please continue with your heart ultrasound tomorrow as already planned and follow-up closely with your primary care provider and cardiologist for reevaluation.  Return to the emergency department with any new or worsening symptoms.

## 2024-06-03 ENCOUNTER — Ambulatory Visit: Admitting: Pain Medicine

## 2024-06-05 ENCOUNTER — Emergency Department
Admission: EM | Admit: 2024-06-05 | Discharge: 2024-06-05 | Disposition: A | Discharge status: Home / Self Care | Attending: Emergency Medicine | Admitting: Emergency Medicine

## 2024-06-05 ENCOUNTER — Other Ambulatory Visit: Payer: Self-pay

## 2024-06-05 DIAGNOSIS — R55 Syncope and collapse: Secondary | ICD-10-CM | POA: Diagnosis present

## 2024-06-05 DIAGNOSIS — I2510 Atherosclerotic heart disease of native coronary artery without angina pectoris: Secondary | ICD-10-CM | POA: Diagnosis not present

## 2024-06-05 DIAGNOSIS — N390 Urinary tract infection, site not specified: Secondary | ICD-10-CM | POA: Insufficient documentation

## 2024-06-05 DIAGNOSIS — Z955 Presence of coronary angioplasty implant and graft: Secondary | ICD-10-CM | POA: Insufficient documentation

## 2024-06-05 DIAGNOSIS — D72829 Elevated white blood cell count, unspecified: Secondary | ICD-10-CM | POA: Insufficient documentation

## 2024-06-05 DIAGNOSIS — E119 Type 2 diabetes mellitus without complications: Secondary | ICD-10-CM | POA: Diagnosis not present

## 2024-06-05 LAB — CBC WITH DIFFERENTIAL/PLATELET
Abs Immature Granulocytes: 0.04 K/uL (ref 0.00–0.07)
Basophils Absolute: 0.1 K/uL (ref 0.0–0.1)
Basophils Relative: 0 %
Eosinophils Absolute: 0.4 K/uL (ref 0.0–0.5)
Eosinophils Relative: 4 %
HCT: 38.5 % (ref 36.0–46.0)
Hemoglobin: 12.4 g/dL (ref 12.0–15.0)
Immature Granulocytes: 0 %
Lymphocytes Relative: 22 %
Lymphs Abs: 2.5 K/uL (ref 0.7–4.0)
MCH: 27.4 pg (ref 26.0–34.0)
MCHC: 32.2 g/dL (ref 30.0–36.0)
MCV: 85 fL (ref 80.0–100.0)
Monocytes Absolute: 0.7 K/uL (ref 0.1–1.0)
Monocytes Relative: 6 %
Neutro Abs: 7.8 K/uL — ABNORMAL HIGH (ref 1.7–7.7)
Neutrophils Relative %: 68 %
Platelets: 210 K/uL (ref 150–400)
RBC: 4.53 MIL/uL (ref 3.87–5.11)
RDW: 14.4 % (ref 11.5–15.5)
WBC: 11.6 K/uL — ABNORMAL HIGH (ref 4.0–10.5)
nRBC: 0 % (ref 0.0–0.2)

## 2024-06-05 LAB — URINALYSIS, ROUTINE W REFLEX MICROSCOPIC
Bilirubin Urine: NEGATIVE
Glucose, UA: NEGATIVE mg/dL
Hgb urine dipstick: NEGATIVE
Ketones, ur: NEGATIVE mg/dL
Nitrite: NEGATIVE
Protein, ur: NEGATIVE mg/dL
Specific Gravity, Urine: 1.003 — ABNORMAL LOW (ref 1.005–1.030)
WBC, UA: >50 WBC/hpf (ref 0–5)
pH: 7 (ref 5.0–8.0)

## 2024-06-05 LAB — COMPREHENSIVE METABOLIC PANEL WITH GFR
ALT: 13 U/L (ref 0–44)
AST: 21 U/L (ref 15–41)
Albumin: 3.6 g/dL (ref 3.5–5.0)
Alkaline Phosphatase: 76 U/L (ref 38–126)
Anion gap: 13 (ref 5–15)
BUN: 13 mg/dL (ref 8–23)
CO2: 25 mmol/L (ref 22–32)
Calcium: 9 mg/dL (ref 8.9–10.3)
Chloride: 99 mmol/L (ref 98–111)
Creatinine, Ser: 0.97 mg/dL (ref 0.44–1.00)
GFR, Estimated: 56 mL/min — ABNORMAL LOW (ref >60)
Glucose, Bld: 197 mg/dL — ABNORMAL HIGH (ref 70–99)
Potassium: 3.7 mmol/L (ref 3.5–5.1)
Sodium: 137 mmol/L (ref 135–145)
Total Bilirubin: 0.4 mg/dL (ref 0.0–1.2)
Total Protein: 6.3 g/dL — ABNORMAL LOW (ref 6.5–8.1)

## 2024-06-05 LAB — TROPONIN T, HIGH SENSITIVITY
Troponin T High Sensitivity: 30 ng/L — ABNORMAL HIGH (ref 0–19)
Troponin T High Sensitivity: 24 ng/L — ABNORMAL HIGH (ref 0–19)

## 2024-06-05 MED ORDER — SODIUM CHLORIDE 0.9 % IV BOLUS
500.0000 mL | Freq: Once | INTRAVENOUS | Status: AC
  Administered 2024-06-05: 11:00:00 500 mL via INTRAVENOUS

## 2024-06-05 MED ORDER — CEPHALEXIN 500 MG PO CAPS: 500.0000 mg | ORAL_CAPSULE | Freq: Two times a day (BID) | ORAL | 0 refills | Status: AC

## 2024-06-05 MED ORDER — CEPHALEXIN 500 MG PO CAPS
500.0000 mg | ORAL_CAPSULE | Freq: Once | ORAL | Status: AC
  Administered 2024-06-05: 16:00:00 500 mg via ORAL
  Filled 2024-06-05: qty 1

## 2024-06-05 MED ORDER — CLONIDINE HCL 0.1 MG PO TABS
0.2000 mg | ORAL_TABLET | Freq: Once | ORAL | Status: AC
  Administered 2024-06-05: 13:00:00 0.2 mg via ORAL
  Filled 2024-06-05: qty 2

## 2024-06-05 NOTE — Discharge Instructions (Addendum)
 You are seeing signs of a urinary tract infection, similar to when you were here a couple months ago.  Take the antibiotic as prescribed and finish the full 7-day course.  Follow-up with your primary care provider.  In the meantime, return to the ER immediately for new, worsening, or persistent severe dizziness or lightheadedness, recurrent episodes of passing out, or any other new or worsening symptoms that concern you.

## 2024-06-05 NOTE — ED Triage Notes (Signed)
 Pt comes from [facility] with c/o loc. Pt was at Pulmonologist visit and was fine until telling son she didn't feel good. Pt went out and stopped talking to son. THis RN spoke with pt upon arrival and she was minimal responsive. Pt id open her eyes and grip hands. Pt leaning to left side. MD Siadeck with pt in hallway and to ED 19. Pt assisted over into bed and now more alert and speaking with staff.

## 2024-06-05 NOTE — ED Provider Notes (Signed)
 [facility] Provider Note    Event Date/Time   First MD Initiated Contact with Patient 06/05/24 1100     (approximate)   History   Loss of Consciousness   HPI  Brandi [name] is a 88 y.o. female with a history of recurrent vasovagal syncope, diabetes, paroxysmal A-fib, CAD status post CABG, LAD stent, PVD, spinal stenosis, OSA on CPAP, and recurrent UTIs who presents with syncope.  Per the husband, they were in the exam room for a pulmonology appointment when she suddenly passed out.  She stated several minutes before that she was not feeling well.  Previously, she was in her usual state of health this morning.  The patient is unable to give any history right now.  She did not fall or hit her head.  I reviewed the past medical records.  The patient was most recently seen in the ED on 12/2 for syncopal episode.  She was seen on 11/18 for near syncope.  She was most recently admitted to the hospitalist service in October with orthostatic hypotension.   Physical Exam   Triage Vital Signs: ED Triage Vitals  Encounter Vitals Group     BP 06/05/24 1104 (!) 167/66     Girls Systolic BP Percentile --      Girls Diastolic BP Percentile --      Boys Systolic BP Percentile --      Boys Diastolic BP Percentile --      Pulse Rate 06/05/24 1104 65     Resp 06/05/24 1104 19     Temp 06/05/24 1104 97.7 F (36.5 C)     Temp src --      SpO2 06/05/24 1104 99 %     Weight 06/05/24 1105 179 lb 0.2 oz (81.2 kg)     Height 06/05/24 1105 5' 1 (1.549 m)     Head Circumference --      Peak Flow --      Pain Score 06/05/24 1102 0     Pain Loc --      Pain Education --      Exclude from Growth Chart --     Most recent vital signs: Vitals:   06/05/24 1328 06/05/24 1330  BP: (!) 213/74 (!) 213/74  Pulse:  64  Resp:  14  Temp:    SpO2:  100%     General: Sleepy appearing but arousable, following commands, no distress.  CV:  Good peripheral perfusion.   Resp:  Normal effort.  Lungs CTAB. Abd:  No distention.  Other:  EOMI.  PERRLA.  No photophobia.  No facial droop.  Motor intact in all extremities.   ED Results / Procedures / Treatments   Labs (all labs ordered are listed, but only abnormal results are displayed) Labs Reviewed  COMPREHENSIVE METABOLIC PANEL WITH GFR - Abnormal; Notable for the following components:      Result Value   Glucose, Bld 197 (*)    Total Protein 6.3 (*)    GFR, Estimated 56 (*)    All other components within normal limits  CBC WITH DIFFERENTIAL/PLATELET - Abnormal; Notable for the following components:   WBC 11.6 (*)    Neutro Abs 7.8 (*)    All other components within normal limits  URINALYSIS, ROUTINE W REFLEX MICROSCOPIC - Abnormal; Notable for the following components:   Color, Urine COLORLESS (*)    APPearance CLEAR (*)    Specific Gravity, Urine 1.003 (*)    Leukocytes,Ua MODERATE (*)  Bacteria, UA RARE (*)    All other components within normal limits  TROPONIN T, HIGH SENSITIVITY - Abnormal; Notable for the following components:   Troponin T High Sensitivity 30 (*)    All other components within normal limits  TROPONIN T, HIGH SENSITIVITY - Abnormal; Notable for the following components:   Troponin T High Sensitivity 24 (*)    All other components within normal limits     EKG  ED ECG REPORT I, Waylon Cassis, the attending physician, personally viewed and interpreted this ECG.  Date: 06/05/2024 EKG Time: 1102 Rate: 68 Rhythm: normal sinus rhythm QRS Axis: normal Intervals: normal ST/T Wave abnormalities: Nonspecific T wave abnormalities Narrative Interpretation: no evidence of acute ischemia    RADIOLOGY    PROCEDURES:  Critical Care performed: No  Procedures   MEDICATIONS ORDERED IN ED: Medications  cephALEXin  (KEFLEX ) capsule 500 mg (has no administration in time range)  sodium chloride  0.9 % bolus 500 mL (500 mLs Intravenous New Bag/Given 06/05/24 1120)   cloNIDine  (CATAPRES ) tablet 0.2 mg (0.2 mg Oral Given 06/05/24 1328)     IMPRESSION / MDM / ASSESSMENT AND PLAN / ED COURSE  I reviewed the triage vital signs and the nursing notes.  88 year old female with PMH as noted above presents with a syncopal episodes while in the outpatient clinic today.  On arrival to the ED the patient was still somewhat somnolent although arousable.  Thorough neurologic exam is nonfocal.  She is hypertensive with otherwise normal vital signs.  Differential diagnosis includes, but is not limited to, vasovagal syncope, hypovolemia, dehydration, hypoglycemia, electrolyte abnormality, other metabolic cause, less likely cardiac dysrhythmia.  There is no evidence of CNS etiology.  The patient has a long history of recurrent syncopal events and multiple ED evaluations and admissions.  EKG is nonischemic.  We will obtain lab workup, give a fluid bolus, and reassess.  Based on the nonfocal neurologic exam and multiple prior similar episodes, there is no indication for imaging.  Patient's presentation is most consistent with acute complicated illness / injury requiring diagnostic workup.  ----------------------------------------- 3:17 PM on 06/05/2024 -----------------------------------------  I gave clonidine  for the patient's elevated blood pressure, and at the time of discharge her systolic blood pressure was in the 140s.  Lab workup is unremarkable.  CMP and CBC show no acute findings except for mild leukocytosis.  Troponins are both minimally elevated but went down from the first of the second.  They are in the same range as prior troponins.  Given her normal EKG and lack of any chest pain or other cardiac symptoms, there is no indication for further cardiac workup.  Urinalysis shows findings concerning for possible UTI.  The patient had similar findings a few months ago and when she was seen in the ED and was treated successfully with Keflex .  Most recent cultures show  E. coli sensitive to cephalosporins.  I did consider whether the patient may benefit from inpatient admission given her age and comorbidities.  I offered admission to the patient.  However, she states she would strongly prefer to go home.  Given that she has had multiple prior ED visits and admissions for this and an extensive syncope workup in the past with a reassuring workup in the ED today, I feel that discharge is reasonable.  I counseled her on the results of the workup and answered all of her questions.  I gave strict return precautions, and she expressed understanding.    FINAL CLINICAL IMPRESSION(S) / ED DIAGNOSES  Final diagnoses:  Syncope, unspecified syncope type  Urinary tract infection without hematuria, site unspecified     Rx / DC Orders   ED Discharge Orders          Ordered    cephALEXin  (KEFLEX ) 500 MG capsule  2 times daily        06/05/24 1453             Note:  This document was prepared using Dragon voice recognition software and may include unintentional dictation errors.    Jacolyn Pae, MD 06/05/24 [phone]

## 2024-06-05 NOTE — ED Notes (Signed)
 Called ccmd for cardiac monitoring

## 2024-06-05 NOTE — ED Notes (Signed)
 This tech assisted pt to ambulate to the restroom at this time.

## 2024-06-09 ENCOUNTER — Telehealth: Payer: Self-pay | Admitting: Pain Medicine

## 2024-06-09 NOTE — Telephone Encounter (Signed)
 Went to ED on 06-05-24 due to syncopal episode, diagnosed with possible UTI.  Denies any fever or symptoms today. I spoke with Dr. Naveira, ok to come for procedure, especially as no steroids will be used. Patient informed.

## 2024-06-09 NOTE — Telephone Encounter (Signed)
 Pt stated that she has Antibiotics 3 days remaining do she needs to cancel appt for tomorrow

## 2024-06-10 ENCOUNTER — Ambulatory Visit: Admitting: Pain Medicine

## 2024-06-10 ENCOUNTER — Telehealth: Payer: Self-pay

## 2024-06-10 ENCOUNTER — Ambulatory Visit
Admission: RE | Admit: 2024-06-10 | Discharge: 2024-06-10 | Disposition: A | Discharge status: Home / Self Care | Source: Ambulatory Visit | Attending: Pain Medicine | Admitting: Pain Medicine

## 2024-06-10 ENCOUNTER — Encounter: Payer: Self-pay | Admitting: Pain Medicine

## 2024-06-10 VITALS — BP 198/90 | HR 80 | Temp 97.8°F | Resp 18 | Ht 61.0 in | Wt 172.0 lb

## 2024-06-10 DIAGNOSIS — G8929 Other chronic pain: Secondary | ICD-10-CM | POA: Insufficient documentation

## 2024-06-10 DIAGNOSIS — M47817 Spondylosis without myelopathy or radiculopathy, lumbosacral region: Secondary | ICD-10-CM

## 2024-06-10 DIAGNOSIS — M5459 Other low back pain: Secondary | ICD-10-CM

## 2024-06-10 DIAGNOSIS — M4317 Spondylolisthesis, lumbosacral region: Secondary | ICD-10-CM | POA: Diagnosis present

## 2024-06-10 DIAGNOSIS — M545 Low back pain, unspecified: Secondary | ICD-10-CM

## 2024-06-10 DIAGNOSIS — M47816 Spondylosis without myelopathy or radiculopathy, lumbar region: Secondary | ICD-10-CM

## 2024-06-10 DIAGNOSIS — M79604 Pain in right leg: Secondary | ICD-10-CM | POA: Diagnosis present

## 2024-06-10 MED ORDER — PENTAFLUOROPROP-TETRAFLUOROETH EX AERO: INHALATION_SPRAY | Freq: Once | CUTANEOUS | Status: DC

## 2024-06-10 MED ORDER — MIDAZOLAM HCL (PF) 2 MG/2ML IJ SOLN
0.5000 mg | Freq: Once | INTRAMUSCULAR | Status: AC
  Administered 2024-06-10: 1 mg via INTRAVENOUS
  Filled 2024-06-10: qty 2

## 2024-06-10 MED ORDER — ROPIVACAINE HCL 2 MG/ML IJ SOLN
18.0000 mL | Freq: Once | INTRAMUSCULAR | Status: AC
  Administered 2024-06-10: 18 mL via PERINEURAL
  Filled 2024-06-10: qty 20

## 2024-06-10 MED ORDER — LIDOCAINE HCL 2 % IJ SOLN
20.0000 mL | Freq: Once | INTRAMUSCULAR | Status: AC
  Administered 2024-06-10: 400 mg
  Filled 2024-06-10: qty 20

## 2024-06-10 NOTE — Patient Instructions (Signed)
 ______________________________________________________________________    Post-Procedure Discharge Instructions  INSTRUCTIONS Apply ice:  Purpose: This will minimize any swelling and discomfort after procedure.  When: Day of procedure, as soon as you get home. How: Fill a plastic sandwich bag with crushed ice. Cover it with a small towel and apply to injection site. How long: (15 min on, 15 min off) Apply for 15 minutes then remove x 15 minutes.  Repeat sequence on day of procedure, until you go to bed. Apply heat:  Purpose: To treat any soreness and discomfort from the procedure. When: Starting the next day after the procedure. How: Apply heat to procedure site starting the day following the procedure. How long: May continue to repeat daily, until discomfort goes away. Food intake: Start with clear liquids (like water) and advance to regular food, as tolerated.  Physical activities: Keep activities to a minimum for the first 8 hours after the procedure. After that, then as tolerated. Driving: If you have received any sedation, be responsible and do not drive. You are not allowed to drive for 24 hours after having sedation. Blood thinner: (Applies only to those taking blood thinners) You may restart your blood thinner 6 hours after your procedure. Insulin : (Applies only to Diabetic patients taking insulin ) As soon as you can eat, you may resume your normal dosing schedule. Infection prevention: Keep procedure site clean and dry. Shower daily and clean area with soap and water.  PAIN DIARY Post-procedure Pain Diary: Extremely important that this be done correctly and accurately. Recorded information will be used to determine the next step in treatment. For the purpose of accuracy, follow these rules: Evaluate only the area treated. Do not report or include pain from an untreated area. For the purpose of this evaluation, ignore all other areas of pain, except for the treated area. After your  procedure, avoid taking a long nap and attempting to complete the pain diary after you wake up. Instead, set your alarm clock to go off every hour, on the hour, for the initial 8 hours after the procedure. Document the duration of the numbing medicine, and the relief you are getting from it. Do not go to sleep and attempt to complete it later. It will not be accurate. If you received sedation, it is likely that you were given a medication that may cause amnesia. Because of this, completing the diary at a later time may cause the information to be inaccurate. This information is needed to plan your care. Follow-up appointment: Keep your post-procedure follow-up evaluation appointment after the procedure (usually 2 weeks for most procedures, 6 weeks for radiofrequencies). DO NOT FORGET to bring you pain diary with you.   EXPECT... (What should I expect to see with my procedure?) From numbing medicine (AKA: Local Anesthetics): Numbness or decrease in pain. You may also experience some weakness, which if present, could last for the duration of the local anesthetic. Onset: Full effect within 15 minutes of injected. Duration: It will depend on the type of local anesthetic used. On the average, 1 to 8 hours.  From steroids (None used) From procedure: Some discomfort is to be expected once the numbing medicine wears off. This should be minimal if ice and heat are applied as instructed.  CALL IF... (When should I call?) You experience numbness and weakness that gets worse with time, as opposed to wearing off. New onset bowel or bladder incontinence. (Applies only to procedures done in the spine)  Emergency Numbers: [place] business hours (Monday - Thursday,  8:00 AM - 4:00 PM) (Friday, 9:00 AM - 12:00 Noon): [phone] After hours: [phone] NOTE: If you are having a problem and are unable connect with, or to talk to a provider, then go to your nearest urgent care or emergency department. If the  problem is serious and urgent, please call 911. ______________________________________________________________________

## 2024-06-10 NOTE — Progress Notes (Addendum)
 PROVIDER NOTE: Interpretation of information contained herein should be left to medically-trained personnel. Specific patient instructions are provided elsewhere under Patient Instructions section of medical record. This document was created in part using STT-dictation technology, any transcriptional errors that may result from this process are unintentional.  Patient: Brandi [name] Type: Established DOB: 1936/05/27 MRN: [id number] PCP: Rudolpho Norleen BIRCH, MD  Service: Procedure DOS: 06/10/2024 Setting: Ambulatory Location: Ambulatory outpatient facility Delivery: Face-to-face Provider: Eric DELENA Como, MD Specialty: Interventional Pain Management Specialty designation: 09 Location: Outpatient facility Ref. Prov.: Como Eric, MD       Interventional Therapy   Type: Lumbar Facet, Medial Branch Block(s)   #2 (w/o STEROIDS) Laterality: Bilateral  Level: L2, L3, L4, L5, and S1 Medial Branch/Dorsal Rami Level(s). Injecting these levels blocks the L3-4, L4-5, and L5-S1 lumbar facet joints. Imaging: Fluoroscopic guidance Spinal ([id number]) Anesthesia: Local anesthesia (1-2% Lidocaine ) Anxiolysis: IV Versed  1.0 mg            Sedation: No Sedation                       DOS: 06/10/2024 Performed by: Eric DELENA Como, MD  Primary Purpose: Diagnostic/Therapeutic Indications: Low back pain severe enough to impact quality of life or function. 1. Chronic low back pain (Bilateral) w/o sciatica   2. Grade 1 Anterolisthesis of lumbosacral spine of L3/L4 and L5/S1   3. Low back pain of over 3 months duration   4. Lumbar facet hypertrophy (Multilevel) (Bilateral)   5. Lumbar facet joint pain   6. Lumbar facet joint syndrome   7. Spondylosis without myelopathy or radiculopathy, lumbosacral region    NAS-11 Pain score:   Pre-procedure: 3 /10   Post-procedure: 0-No pain/10     Position / Prep / Materials:  Position: Prone  Prep solution: ChloraPrep (2% chlorhexidine  gluconate and 70%  isopropyl alcohol ) Area Prepped: Posterolateral Lumbosacral Spine (Wide prep: From the lower border of the scapula down to the end of the tailbone and from flank to flank.)  Materials:  Tray: Block Needle(s):  Type: Spinal  Gauge (G): 22  Length: 5-in Qty: 4     H&P (Pre-op Assessment):  Brandi [name] is a 88 y.o. (year old), female patient, seen today for interventional treatment. She  has a past surgical history that includes Cholecystectomy; Total knee arthroplasty (Bilateral); Septoplasty; Bunionectomy; LOOP RECORDER INSERTION (N/A, 09/20/2016); Colonoscopy with propofol  (N/A, 06/14/2017); Breast biopsy (Right, 2004); LEFT HEART CATH AND CORONARY ANGIOGRAPHY (N/A, 05/22/2018); XI Robotic assisted inguinal hernia repair with mesh (Left, 10/14/2020); Coronary artery bypass graft (N/A, 05/29/2018); LEFT ATRIAL APPENDAGE OCCLUSION (Left, 05/29/2018); Coronary angioplasty with stent (Left, 08/05/2010); LEFT HEART CATH AND CORONARY ANGIOGRAPHY (Left, 07/03/2011); LEFT HEART CATH AND CORONARY ANGIOGRAPHY (Left, 05/22/2018); and Hysteroscopy with D & C (N/A, 12/14/2022). Brandi [name] has a current medication list which includes the following prescription(s): acetaminophen , amlodipine , aspirin  ec, atorvastatin , budesonide-formoterol , carboxymethylcellulose, carvedilol , cephalexin , cyanocobalamin , ferrous sulfate , furosemide , gabapentin , lantus  solostar, ipratropium, janumet  xr, losartan , melatonin, mirabegron  er, preservision/lutein , nitroglycerin , progesterone , and sodium chloride , and the following Facility-Administered Medications: pentafluoroprop-tetrafluoroeth. Her primarily concern today is the Back Pain  Initial Vital Signs:  Pulse/HCG Rate: 80ECG Heart Rate: 87 Temp: (!) 97.2 F (36.2 C) Resp: 16 BP: (!) 193/89 (took BP med about 20 mins ago) SpO2: 98 %  BMI: Estimated body mass index is 32.5 kg/m as calculated from the following:   Height as of this encounter: 5' 1 (1.549 m).   Weight as of  this encounter: 172  lb (78 kg).  Risk Assessment: Allergies: Reviewed. She is allergic to accupril [quinapril hcl], micardis [telmisartan], and quinapril.  Allergy Precautions: None required Coagulopathies: Reviewed. None identified.  Blood-thinner therapy: None at this time Active Infection(s): Reviewed. None identified. Brandi [name] is afebrile  Site Confirmation: Brandi [name] was asked to confirm the procedure and laterality before marking the site Procedure checklist: Completed Consent: Before the procedure and under the influence of no sedative(s), amnesic(s), or anxiolytics, the patient was informed of the treatment options, risks and possible complications. To fulfill our ethical and legal obligations, as recommended by the American Medical Association's Code of Ethics, I have informed the patient of my clinical impression; the nature and purpose of the treatment or procedure; the risks, benefits, and possible complications of the intervention; the alternatives, including doing nothing; the risk(s) and benefit(s) of the alternative treatment(s) or procedure(s); and the risk(s) and benefit(s) of doing nothing. The patient was provided information about the general risks and possible complications associated with the procedure. These may include, but are not limited to: failure to achieve desired goals, infection, bleeding, organ or nerve damage, allergic reactions, paralysis, and death. In addition, the patient was informed of those risks and complications associated to Spine-related procedures, such as failure to decrease pain; infection (i.e.: Meningitis, epidural or intraspinal abscess); bleeding (i.e.: epidural hematoma, subarachnoid hemorrhage, or any other type of intraspinal or peri-dural bleeding); organ or nerve damage (i.e.: Any type of peripheral nerve, nerve root, or spinal cord injury) with subsequent damage to sensory, motor, and/or autonomic systems, resulting in permanent pain, numbness,  and/or weakness of one or several areas of the body; allergic reactions; (i.e.: anaphylactic reaction); and/or death. Furthermore, the patient was informed of those risks and complications associated with the medications. These include, but are not limited to: allergic reactions (i.e.: anaphylactic or anaphylactoid reaction(s)); adrenal axis suppression; blood sugar elevation that in diabetics may result in ketoacidosis or comma; water retention that in patients with history of congestive heart failure may result in shortness of breath, pulmonary edema, and decompensation with resultant heart failure; weight gain; swelling or edema; medication-induced neural toxicity; particulate matter embolism and blood vessel occlusion with resultant organ, and/or nervous system infarction; and/or aseptic necrosis of one or more joints. Finally, the patient was informed that Medicine is not an exact science; therefore, there is also the possibility of unforeseen or unpredictable risks and/or possible complications that may result in a catastrophic outcome. The patient indicated having understood very clearly. We have given the patient no guarantees and we have made no promises. Enough time was given to the patient to ask questions, all of which were answered to the patient's satisfaction. Brandi [name] has indicated that she wanted to continue with the procedure. Attestation: I, the ordering provider, attest that I have discussed with the patient the benefits, risks, side-effects, alternatives, likelihood of achieving goals, and potential problems during recovery for the procedure that I have provided informed consent. Date  Time: 06/10/2024  8:09 AM  Pre-Procedure Preparation:  Monitoring: As per clinic protocol. Respiration, ETCO2, SpO2, BP, heart rate and rhythm monitor placed and checked for adequate function Safety Precautions: Patient was assessed for positional comfort and pressure points before starting the  procedure. Time-out: I initiated and conducted the Time-out before starting the procedure, as per protocol. The patient was asked to participate by confirming the accuracy of the Time Out information. Verification of the correct person, site, and procedure were performed and confirmed by me, the nursing staff, and  the patient. Time-out conducted as per Joint Commission's Universal Protocol (UP.01.01.01). Time: 0832 Start Time: 0832 hrs.  Description of Procedure:          Laterality: (see above) Targeted Levels: (see above)  Safety Precautions: Aspiration looking for blood return was conducted prior to all injections. At no point did we inject any substances, as a needle was being advanced. Before injecting, the patient was told to immediately notify me if she was experiencing any new onset of ringing in the ears, or metallic taste in the mouth. No attempts were made at seeking any paresthesias. Safe injection practices and needle disposal techniques used. Medications properly checked for expiration dates. SDV (single dose vial) medications used. After the completion of the procedure, all disposable equipment used was discarded in the proper designated medical waste containers. Local Anesthesia: Protocol guidelines were followed. The patient was positioned over the fluoroscopy table. The area was prepped in the usual manner. The time-out was completed. The target area was identified using fluoroscopy. A 12-in long, straight, sterile hemostat was used with fluoroscopic guidance to locate the targets for each level blocked. Once located, the skin was marked with an approved surgical skin marker. Once all sites were marked, the skin (epidermis, dermis, and hypodermis), as well as deeper tissues (fat, connective tissue and muscle) were infiltrated with a small amount of a short-acting local anesthetic, loaded on a 10cc syringe with a 25G, 1.5-in  Needle. An appropriate amount of time was allowed for  local anesthetics to take effect before proceeding to the next step. Local Anesthetic: Lidocaine  2.0% The unused portion of the local anesthetic was discarded in the proper designated containers. Technical description of process:  Medial Branch  Dorsal Rami Nerve Block (MBB):  Neuroanatomy note: Each lumbar facet joint receives dual innervation from medial branches arising from the posterior primary rami at the same level and one level above. The target for each lumbar medial branch is the junction of the ipsilateral superior articular and transverse process of the lower vertebral body. (i.e.: The L4-L5 facet joint is innervated by the L4 medial branch [located at L5] and the L3 medial branch [located at L4]. Blocking the L4 Medial Branch is therefore achieved by injecting at the junction of the ipsilateral superior articular and transverse process of the lower vertebral body [L5].).  Exception: The exception to the above rule is the L5-S1 facet joint which has triple innervation requiring the L4 medial branch, as well as the L5 and the S1 Dorsal Rami(s) to be blocked to fully denervate the joint.  Under fluoroscopic guidance, a needle was inserted until contact was made with os over the target area. After negative aspiration, 0.5 mL of the nerve block solution was injected without difficulty or complication. Paresthesia were avoided during injection. The needle(s) were removed intact and without complication.  Once the entire procedure was completed, the treated area was cleaned, making sure to leave some of the prepping solution back to take advantage of its long term bactericidal properties.         Illustration of the posterior view of the lumbar spine and the posterior neural structures. Laminae of L2 through S1 are labeled. DPRL5, dorsal primary ramus of L5; DPRS1, dorsal primary ramus of S1; DPR3, dorsal primary ramus of L3; FJ, facet (zygapophyseal) joint L3-L4; I, inferior articular process  of L4; LB1, lateral branch of dorsal primary ramus of L1; IAB, inferior articular branches from L3 medial branch (supplies L4-L5 facet joint); IBP, intermediate branch plexus;  MB3, medial branch of dorsal primary ramus of L3; NR3, third lumbar nerve root; S, superior articular process of L5; SAB, superior articular branches from L4 (supplies L4-5 facet joint also); TP3, transverse process of L3.   Facet Joint Innervation (* possible contribution)  L1-2 T12, L1 (L2*)  Medial Branch  L2-3 L1, L2 (L3*)                     L3-4 L2, L3 (L4*)                     L4-5 L3, L4 (L5*)                     L5-S1 L4, L5, S1                        Vitals:   06/10/24 0832 06/10/24 0836 06/10/24 0841 06/10/24 0848  BP: (!) 227/95 (!) 198/155 (!) 213/76 (!) 198/90  Pulse:      Resp: 20 19 19 18   Temp:    97.8 F (36.6 C)  TempSrc:    Temporal  SpO2: 99% 99% 100% 98%  Weight:      Height:         End Time: 0839 hrs.  Imaging Guidance (Spinal):         Type of Imaging Technique: Fluoroscopy Guidance (Spinal) Indication(s): Fluoroscopy guidance for needle placement to enhance accuracy in procedures requiring precise needle localization for targeted delivery of medication in or near specific anatomical locations not easily accessible without such real-time imaging assistance. Exposure Time: Please see nurses notes. Contrast: None used. Fluoroscopic Guidance: I was personally present during the use of fluoroscopy. Tunnel Vision Technique used to obtain the best possible view of the target area. Parallax error corrected before commencing the procedure. Direction-depth-direction technique used to introduce the needle under continuous pulsed fluoroscopy. Once target was reached, antero-posterior, oblique, and lateral fluoroscopic projection used confirm needle placement in all planes. Images permanently stored in EMR. Interpretation: No contrast injected. I personally interpreted the imaging  intraoperatively. Adequate needle placement confirmed in multiple planes. Permanent images saved into the patient's record.  Post-operative Assessment:  Post-procedure Vital Signs:  Pulse/HCG Rate: 8078 Temp: 97.8 F (36.6 C) Resp: 18 BP: (!) 198/90 SpO2: 98 %  EBL: None  Complications: No immediate post-treatment complications observed by team, or reported by patient.  Note: The patient tolerated the entire procedure well. A repeat set of vitals were taken after the procedure and the patient was kept under observation following institutional policy, for this type of procedure. Post-procedural neurological assessment was performed, showing return to baseline, prior to discharge. The patient was provided with post-procedure discharge instructions, including a section on how to identify potential problems. Should any problems arise concerning this procedure, the patient was given instructions to immediately contact us , at any time, without hesitation. In any case, we plan to contact the patient by telephone for a follow-up status report regarding this interventional procedure.  Comments:  No additional relevant information.  Plan of Care (POC)  Orders:  Orders Placed This Encounter  Procedures   LUMBAR FACET(MEDIAL BRANCH NERVE BLOCK) MBNB    Scheduling Instructions:     Procedure: Lumbar facet block (AKA.: Lumbosacral medial branch nerve block)     Side: Bilateral     Level: L3-4, L4-5, and L5-S1 Facets (L2, L3, L4, L5, and S1 Medial Branch Nerves)     Sedation:  Patient's choice.     Date: 06/10/2024    Where will this procedure be performed?:   [facility]   DG [facility] C-ARM 1-60 MIN NO REPORT    Intraoperative interpretation by procedural physician at [facility].    Standing Status:   Standing    Number of Occurrences:   1    Reason for exam::   Assistance in needle guidance and placement for procedures requiring needle placement in or near specific  anatomical locations not easily accessible without such assistance.   Informed Consent Details: Physician/Practitioner Attestation; Transcribe to consent form and obtain patient signature    Nursing Order: Transcribe to consent form and obtain patient signature. Note: Always confirm laterality of pain with Brandi [name], before procedure.    Physician/Practitioner attestation of informed consent for procedure/surgical case:   I, the physician/practitioner, attest that I have discussed with the patient the benefits, risks, side effects, alternatives, likelihood of achieving goals and potential problems during recovery for the procedure that I have provided informed consent.    Procedure:   Lumbar Facet Block  under fluoroscopic guidance    Physician/Practitioner performing the procedure:   Jayland Null A. Tanya MD    Indication/Reason:   Low Back Pain, with our without leg pain, due to Facet Joint Arthralgia (Joint Pain) Spondylosis (Arthritis of the Spine), without myelopathy or radiculopathy (Nerve Damage).   Provide equipment / supplies at bedside    Procedure tray: Block Tray (Disposable  single use) Skin infiltration needle: Regular 1.5-in, 25-G, (x1) Block Needle type: Spinal Amount/quantity: 4 Size: Regular (3.5-inch) Gauge: 22G    Standing Status:   Standing    Number of Occurrences:   1    Specify:   Block Tray   Saline lock IV    Have LR [phone] mL available and administer at 125 mL/hr if patient becomes hypotensive.    Standing Status:   Standing    Number of Occurrences:   1     Opioid Analgesic: No chronic opioid analgesics therapy prescribed by our practice. None MME/day: 0 mg/day    Medications ordered for procedure: Meds ordered this encounter  Medications   lidocaine  (XYLOCAINE ) 2 % (with pres) injection 400 mg   pentafluoroprop-tetrafluoroeth (GEBAUERS) aerosol   midazolam  PF (VERSED ) injection 0.5-2 mg    Make sure Flumazenil is available in the pyxis when using  this medication. If oversedation occurs, administer 0.2 mg IV over 15 sec. If after 45 sec no response, administer 0.2 mg again over 1 min; may repeat at 1 min intervals; not to exceed 4 doses (1 mg)   ropivacaine  (PF) 2 mg/mL (0.2%) (NAROPIN ) injection 18 mL   Medications administered: We administered lidocaine , midazolam  PF, and ropivacaine  (PF) 2 mg/mL (0.2%).  See the medical record for exact dosing, route, and time of administration.    Interventional Therapies  Risk Factors  Considerations  Medical Comorbidities:  T2IDDM  MO (BMI>35)  CAD s/p CABG x 3  Hx A-Fib/Flutter  Hx. Orthostatic Hypotension  Stage 3 CKD  GERD  HTN  Cystocele Grade 3  Incontinence     Planned  Pending:   Diagnostic bilateral lumbar facet (L2-S1) MBB #1    Under consideration:   Diagnostic right sacroiliac joint BLK #2  Diagnostic right lumbar facet MBB #2    Completed:   Diagnostic right SI joint Blk x1 (08/21/2023) (100/100/0/0) (Requested by referring NS: Dr. CHRISTELLA. CHRISTELLA. Abd-El-Barr ) Diagnostic/therapeutic right L4-5 LESI x1 (10/16/2023) (0/100/25 x 2 weeks/LBP:0LEP:100)  Diagnostic right lumbar facet (L2-S1) MBB x1 (11/13/2023) (Undetermined) (No useful information from patient)   Therapeutic  Palliative (PRN) options:   Diagnostic (L)LE NCT (EMG/PNCV) (03/07/2021) by Arthea Farrow, MD ([facility]) Dx: Electrodiagnostic evidence of chronic severe sensorimotor polyneuropathy.   Completed by other providers:   Therapeutic right L4-5 TFESI x3 (05/24/2017, 06/29/2017, 08/30/2017) by Morene Falcon, DO [facility])  Therapeutic right SI joint inj. x4 (09/28/2016, 02/20/2017, 12/04/2017, 01/24/2018) by Morene Falcon, DO [facility])  Diagnostic left IA hip joint inj. x1 (06/10/2015) by Morene Falcon, DO [facility])        Follow-up plan:   Return in about 2 weeks (around 06/24/2024) for (Face2F), (PPE), w/ Emmy Blanch, NP.     Recent Visits Date Type Provider Dept  05/19/24 Office Visit  Tanya Glisson, MD [facility]  04/24/24 Procedure visit Tanya Glisson, MD [facility]  Showing recent visits within past 90 days and meeting all other requirements Today's Visits Date Type Provider Dept  06/10/24 Procedure visit Tanya Glisson, MD [facility]  Showing today's visits and meeting all other requirements Future Appointments Date Type Provider Dept  06/23/24 Appointment Patel, Seema K, NP [facility]  Showing future appointments within next 90 days and meeting all other requirements   Disposition: Discharge home  Discharge (Date  Time): 06/10/2024; 0850 hrs.   Primary Care Physician: Rudolpho Norleen BIRCH, MD Location: [facility] Note by: Glisson DELENA Tanya, MD (TTS technology used. I apologize for any typographical errors that were not detected and corrected.) Date: 06/10/2024; Time: 9:22 AM  Disclaimer:  Medicine is not an visual merchandiser. The only guarantee in medicine is that nothing is guaranteed. It is important to note that the decision to proceed with this intervention was based on the information collected from the patient. The Data and conclusions were drawn from the patient's questionnaire, the interview, and the physical examination. Because the information was provided in large part by the patient, it cannot be guaranteed that it has not been purposely or unconsciously manipulated. Every effort has been made to obtain as much relevant data as possible for this evaluation. It is important to note that the conclusions that lead to this procedure are derived in large part from the available data. Always take into account that the treatment will also be dependent on availability of resources and existing treatment guidelines, considered by other Pain Management Practitioners as being common knowledge and practice, at the time of the intervention. For Medico-Legal purposes, it is also important to  point out that variation in procedural techniques and pharmacological choices are the acceptable norm. The indications, contraindications, technique, and results of the above procedure should only be interpreted and judged by a Board-Certified Interventional Pain Specialist with extensive familiarity and expertise in the same exact procedure and technique.

## 2024-06-10 NOTE — Progress Notes (Signed)
 Safety precautions to be maintained throughout the outpatient stay will include: orient to surroundings, keep bed in low position, maintain call bell within reach at all times, provide assistance with transfer out of bed and ambulation.

## 2024-06-11 ENCOUNTER — Telehealth: Payer: Self-pay

## 2024-06-11 NOTE — Telephone Encounter (Signed)
 Post procedure follow up ,  Patient states she is doing good.

## 2024-06-13 NOTE — Progress Notes (Signed)
 Chief Complaint:   Chief Complaint  Patient presents with   Hospital Follow Up    Subjective:   Brandi [name] is a 88 y.o. female in today for ER follow-up.  She was recently seen in the ER for syncopal episode and was diagnosed with urinary tract infection.  Current Outpatient Medications  Medication Sig Dispense Refill   acetaminophen  (TYLENOL ) 500 MG tablet Take 1,000 mg by mouth 2 (two) times daily        aspirin  81 MG EC tablet Take 81 mg by mouth once daily.     atorvastatin  (LIPITOR) 40 MG tablet TAKE 1 TABLET(40 MG) BY MOUTH EVERY NIGHT 90 tablet 1   BD ULTRA-FINE SHORT PEN NEEDLE 31 gauge x 5/16 needle USE ONCE DAILY AS DIRECTED 100 each 3   blood glucose diagnostic (ACCU-CHEK AVIVA PLUS TEST STRP) test strip 3 (three) times daily Use as instructed to test blood sugar 200 strip 4   blood-glucose sensor (FREESTYLE LIBRE 3 SENSOR) Use 1 each every 10 (ten) days     carboxymethylcellulose (REFRESH TEARS) 0.5 % ophthalmic solution Place 1 drop into both eyes nightly     carvediloL  (COREG ) 25 MG tablet Take 0.5 tablets (12.5 mg total) by mouth 2 (two) times daily with meals (Patient taking differently: Take 25 mg by mouth 2 (two) times daily with meals) 90 tablet 3   cholecalciferol 1000 unit tablet Take by mouth     cyanocobalamin  (VITAMIN B12) 1000 MCG tablet Take 1,000 mcg by mouth once daily       ferrous sulfate  325 (65 FE) MG tablet Take 325 mg by mouth once daily     fluticasone  propionate (FLONASE ) 50 mcg/actuation nasal spray Place 1 spray into both nostrils 2 (two) times daily 16 g 0   FUROsemide  (LASIX ) 20 MG tablet TAKE 1 TABLET(20 MG) BY MOUTH EVERY OTHER DAY 15 tablet 3   gabapentin  (NEURONTIN ) 300 MG capsule Take 300 mg by mouth 3 (three) times daily     gabapentin  (NEURONTIN ) 400 MG capsule TAKE 1 CAPSULE(400 MG) BY MOUTH THREE TIMES DAILY 90 capsule 2   hydrALAZINE  (APRESOLINE ) 10 MG tablet Take 1 tablet (10 mg total) by mouth 2 (two) times daily 60  tablet 11   insulin  GLARGINE (LANTUS  SOLOSTAR U-100 INSULIN ) pen injector (concentration 100 units/mL) INJECT 18 UNITS UNDER THE SKIN ONCE DAILY (Patient taking differently: INJECT 5 UNITS UNDER THE SKIN ONCE DAILY) 15 mL 3   ipratropium (ATROVENT) 0.06 % nasal spray Place 2 sprays into both nostrils 3 (three) times daily as needed for Rhinitis 15 mL 0   lancets (ACCU-CHEK SOFTCLIX LANCETS) USE AS DIRECTED TWICE DAILY 200 each 3   losartan  (COZAAR ) 100 MG tablet Take 0.5 tablets (50 mg total) by mouth at bedtime (Patient taking differently: Take 100 mg by mouth at bedtime) 90 tablet 3   meclizine  (ANTIVERT ) 12.5 mg tablet Take 12.5 mg 1-3 times daily as needed for vertigo 30 tablet 1   melatonin 10 mg Tab Take 1 tablet by mouth nightly     mirabegron  (MYRBETRIQ ) 50 mg ER tablet Take 50 mg by mouth once daily     multivitamin with minerals, EYE, (PRESERVISION AREDS 2) soft gel capsule Take 1 capsule by mouth 2 (two) times daily with meals        nitroGLYcerin  (NITROSTAT ) 0.4 MG SL tablet Place 1 tablet (0.4 mg total) under the tongue every 5 (five) minutes as needed for Chest pain May take up to 3 doses. 25  tablet 11   polyethylene glycol (MIRALAX ) powder Mix in 4-8ounces of fluid prior to taking. 238 g 0   progesterone  (PROMETRIUM ) 100 MG capsule Take 1 capsule (100 mg total) by mouth at bedtime 30 capsule 0   SITagliptin  phos-metFORMIN  (JANUMET  XR) 100-1,000 mg ER 24 hr multiphase tablet Take 1 tablet by mouth daily with dinner 90 tablet 3   spironolactone  (ALDACTONE ) 25 MG tablet Take 25 mg by mouth once daily     SYMBICORT 80-4.5 mcg/actuation inhaler INHALE 2 PUFFS INTO THE LUNGS TWICE DAILY 10.2 g 2   triamcinolone  0.5 % cream APPLY TOPICALLY TO THE AFFECTED AREA TWICE DAILY 30 g 1   verapamiL  (VERELAN  PM) 300 mg ER capsule TAKE ONE CAPSULE BY MOUTH AT BEDTIME 90 capsule 1   No current facility-administered medications for this visit.    Allergies as of 06/13/2024 - Reviewed  06/05/2024  Allergen Reaction Noted   Accupril [quinapril] Rash    Micardis [telmisartan] Dizziness     Past Medical History:  Diagnosis Date   Anemia, unspecified    ASCVD (arteriosclerotic cardiovascular disease)    Coronary arteriosclerosis due to lipid rich plaque 01/06/2014   status post PCI of proximal LAD with a Xience 3.0 x 23 mm drug-eluting stent    Coronary artery disease    Essential hypertension, benign    Hemorrhage of rectum and anus    Left wrist fracture 04/2016   Obesity    On continuous heparin  infusion 05/27/2018   Osteoarthritis    Other and unspecified hyperlipidemia    Pelvic fracture (CMS/HHS-HCC)    Pernicious anemia    Presence of drug coated stent in LAD coronary artery 05/27/2018   Status post PCI of proximal LAD with a Xience 3.0 x 23 mm drug-eluting stent    PUD (peptic ulcer disease)    Recurrent UTI    Followed by Dr. Twylla   Right ulnar fracture 07/2021   Spinal stenosis    Type 2 diabetes mellitus (CMS/HHS-HCC)     Past Surgical History:  Procedure Laterality Date   ERCP  1998   KNEE ARTHROSCOPY Right 2004   Total knee Replacement   KNEE ARTHROSCOPY Left 09/2008   Total Knee Replacement   coronary stent  2012   COLONOSCOPY  06/14/2017   Tubular adenoma of the colon/Repeat 75yrs if desired/MUS   CORONARY ARTERY BYPASS W/SINGLE ARTERY GRAFT N/A 05/29/2018   Procedure: CORONARY ARTERY BYPASS, USING ARTERIAL GRAFT(S); SINGLE ARTERIAL GRAFT, Left internal mammary artery harvest;  Surgeon: Dora Bernardino Dover, MD;  Location: [facility];  Service: Cardiothoracic;  Laterality: N/A;   CORONARY ARTERY BYPASS W/VEIN ONLY N/A 05/29/2018   Procedure: CORONARY ARTERY BYPASS, USING VENOUS GRAFT(S) AND ARTERIAL GRAFT(S);SINGLE VEIN GRAFT (LIST IN ADDITION TO PRIMARY PROCEDURE);  Surgeon: Dora Bernardino Dover, MD;  Location: [facility];  Service: Cardiothoracic;  Laterality: N/A;   APPLICATION WOUND VAC  N/A 05/29/2018   Procedure: NEGATIVE PRESSURE WOUND THERAPY, INCLUDING TOPICAL APPLICATION(S), ASSESSMENT, INSTRUCTION(S) FOR ONGOING CARE, PER SESSION; TOTAL WOUND(S) SURFACE AREA LESS THAN OR EQUAL TO 50 SQUARE CENTIMETERS;  Surgeon: Dora Bernardino Dover, MD;  Location: [facility];  Service: Cardiothoracic;  Laterality: N/A;   Fractional D&C with hysteroscopy  12/14/2022   Bunionectomy Right    CARDIAC CATHETERIZATION     CHOLECYSTECTOMY     CORONARY ANGIOPLASTY     status post PCI of proximal LAD with a Xience 3.0 x 23 mm drug-eluting stent   CORONARY ARTERY BYPASS GRAFT  REPAIR NASAL SEPTAL PERFORATIONS       Family History  Problem Relation Name Age of Onset   Alzheimer's disease Mother     Myocardial Infarction (Heart attack) Mother     No Known Problems Father     No Known Problems Sister     No Known Problems Brother     No Known Problems Daughter     No Known Problems Son     Autism Maternal Aunt     No Known Problems Maternal Uncle     No Known Problems Paternal Aunt     No Known Problems Paternal Uncle     No Known Problems Maternal Grandmother     No Known Problems Maternal Grandfather     No Known Problems Paternal Grandmother     No Known Problems Paternal Grandfather      Social History:  reports that she has never smoked. She has never used smokeless tobacco. She reports that she does not drink alcohol  and does not use drugs.  Results for orders placed or performed in visit on 06/02/24  ECG 12-lead  Result Value Ref Range   Vent Rate (bpm) 67    PR Interval (msec) 138    QRS Interval (msec) 76    QT Interval (msec) 412    QTc (msec) 435    Narrative   Normal sinus rhythm Left ventricular hypertrophy with repolarization abnormality ( R in aVL ) Abnormal ECG When compared with ECG of 07-Nov-2022 12:20, Nonspecific T wave abnormality, worse in Lateral leads I reviewed and concur with this report. Electronically signed  ab:RJOOTNNI MD, DWAYNE ([id number]) on 06/03/2024 7:31:54 PM   *Note: Due to a large number of results and/or encounters for the requested time period, some results have not been displayed. A complete set of results can be found in Results Review.      ROS:  General: No fever, chills or recent illness. No change in weight Skin:   No skin lesions, growths, masses, rashes, pruritus  HEENT: No change in vision or hearing. No pain or difficulty with swallowing Respiratory: No cough or shortness of breath CV:  No chest pain or palpitations GI:  No pain, dyspepsia or change in bowel habits GU:  No dysuria, frequency, or hesitancy MSK:  No joint pain or injury Neurological: No headaches, changes in mental status, loss of sensation or strength Endocrine:  No heat or cold intolerance, polydipsia, polyuria  Objective:   Body mass index is 32.31 kg/m.  BP 112/70   Pulse 72   Ht 154.9 cm (5' 1)   Wt 77.6 kg (171 lb) Comment: per chart  LMP  (LMP Unknown)   SpO2 97%   BMI 32.31 kg/m   General: WD/WN female, in no acute distress HEENT: Pupils equal and round, EOMI. oral mucosa moist.  Oropharynx clear. Neck: supple, trachea midline; no thyromegaly Respiratory:clear to auscultation.  No dullness to percussion.  No use of accessory muscles. Cardiac:  Regular rate and rhythm without murmur, gallops, or rubs   Assessment/Plan:   Syncope, unspecified syncope type  (primary encounter diagnosis) Urinary tract infection without hematuria, site unspecified  Assessment and Plan  1.  Syncope.  Reviewed ER notes on her visit.  She has had multiple workups for syncope and nothing specific has been found.  No further workup at this point. 2.  Urinary tract infection.  She was treated with Keflex  and has finished the prescription.  No symptoms.    Goals      *  Exercise (x goals) (pt-stated)      Be more active, move more.      * Increase physical activity (pt-stated)      To be more  Physical     * Maintain health/healthy lifestyle (pt-stated)     * Patient Goals (pt-stated)      Wear CPAP consistently Eat more vegetables.        NORLEEN ALM ROWER, MD  Portions of this note were created using dictation software and may contain typographical errors.  *Some images could not be shown.

## 2024-06-20 NOTE — Progress Notes (Signed)
 PROVIDER NOTE: Interpretation of information contained herein should be left to medically-trained personnel. Specific patient instructions are provided elsewhere under Patient Instructions section of medical record. This document was created in part using AI and STT-dictation technology, any transcriptional errors that may result from this process are unintentional.  Patient: Brandi [name]  Service: E/M   PCP: Rudolpho Norleen BIRCH, MD  DOB: Oct 08, 1935  DOS: 06/23/2024  Provider: Emmy MARLA Blanch, NP  MRN: [id number]  Delivery: Face-to-face  Specialty: Interventional Pain Management  Type: Established Patient  Setting: Ambulatory outpatient facility  Specialty designation: 09  Referring Prov.: Rudolpho Norleen BIRCH, MD  Location: Outpatient office facility       History of present illness (HPI) Ms. Brandi [name], a 89 y.o. year old female, is here today because of her Chronic bilateral low back pain without sciatica [M54.50, G89.29]. Ms. Brandi [name] primary complain today is Back Pain (Lumbar bilateral, right is worse )  Pertinent problems: Ms. Son has Chronic cystitis; Diabetic neuropathy (HCC); Severe obesity (BMI 35.0-39.9) with comorbidity (HCC); Diabetes mellitus type 2, insulin  dependent (HCC); Chronic kidney disease, stage III (moderate) (HCC); Chronic pain syndrome; Pharmacologic therapy; Disorder of skeletal system; Chronic low back pain (1ry area of Pain) (Bilateral) (R>L) w/ sciatica (Right); Low back pain radiating to leg (Right); Pain of lateral upper thigh (Right) (Intermittent); Chronic sacroiliac joint pain (Right); Somatic dysfunction of sacroiliac joint (Right); Grade 1 Anterolisthesis of lumbosacral spine of L3/L4 and L5/S1; Lumbar facet joint pain; Chronic lower extremity pain (2ry area of Pain) (Right); Lumbosacral radiculopathy at L5 (Right); Lumbosacral radiculopathy at S1 (Right); DDD, lumbosacral w/ LBP & LEP; Lumbar facet hypertrophy (Multilevel) (Bilateral); Lumbar foraminal stenosis; Chronic  low back pain (Right) w/o sciatica; Lumbar facet joint syndrome; Spondylosis without myelopathy or radiculopathy, lumbosacral region; Polyneuropathy, peripheral sensorimotor axonal; Diabetic sensorimotor polyneuropathy (HCC); Type 2 diabetes mellitus with peripheral neuropathy (HCC); Chronic low back pain (Bilateral) w/o sciatica; Low back pain of over 3 months duration; and Multifactorial low back pain on their pertinent problem list.  Pain Assessment: Severity of Chronic pain is reported as a 3 /10. Location: Back Lower, Right, Left/down the right leg to the foot. Onset: More than a month ago. Quality: Discomfort, Aching, Nagging. Timing: Constant. Modifying factor(s): heat helps a lot, medications and procedure. Vitals:  height is 5' 1 (1.549 m) and weight is 173 lb 12.8 oz (78.8 kg). Her temporal temperature is 97.4 F (36.3 C) (abnormal). Her blood pressure is 160/80 (abnormal) and her pulse is 71. Her respiration is 16 and oxygen saturation is 98%.  BMI: Estimated body mass index is 32.84 kg/m as calculated from the following:   Height as of this encounter: 5' 1 (1.549 m).   Weight as of this encounter: 173 lb 12.8 oz (78.8 kg).  Last encounter: Visit date not found. Last procedure: 06/10/2024  Reason for encounter: post-procedure evaluation and assessment.   Ms. Brandi [name] underwent a diagnostic/therapeutic lumbar facet block on June 10, 2024.  She reports 100% pain relief from the lower lumbar facet joints during local anesthetic phase, followed by sustained ongoing 50% improvement despite the diagnostic injection being performed without steroids.   The patient reports 50% improvement on the left side and 25% improvement on the right side.  She also reports that her bilateral lower extremity pain has significantly improved.  Discussed the use of AI scribe software for clinical note transcription with the patient, who gave verbal consent to proceed.  History of Present Illness   Brandi Bennison  [name] is an 89 year old female with chronic back pain who presents for evaluation of persistent pain after lumbar facet block.  She experiences persistent lower back pain primarily on the right side, described as chronic. A lumbar facet block was performed on June 10, 2024, which provided some initial pain relief the day after the procedure, but the pain soon returned. However, the pain returned, with only a 25% improvement overall. The pain radiates to her right leg, but the leg pain has resolved post-procedure.  She also reports additional pain in the mid-thoracic region, which worsens with activity such as bending or working. This pain has been present since the procedure and affects both sides of her back, with the right side being more severe than the left. She notes that the left side pain may have improved by about 50% after the procedure, but she is uncertain.  She takes gabapentin  and Tylenol  every six hours for pain management. She has diabetes.     Procedure Type: Lumbar Facet, Medial Branch Block(s)   #2 (w/o STEROIDS) Laterality: Bilateral  Level: L2, L3, L4, L5, and S1 Medial Branch/Dorsal Rami Level(s). Injecting these levels blocks the L3-4, L4-5, and L5-S1 lumbar facet joints. Imaging: Fluoroscopic guidance Spinal ([id number]) Anesthesia: Local anesthesia (1-2% Lidocaine ) Anxiolysis: IV Versed  1.0 mg            Sedation: No Sedation                       DOS: 06/10/2024 Performed by: Eric DELENA Como, MD   Primary Purpose: Diagnostic/Therapeutic Indications: Low back pain severe enough to impact quality of life or function. 1. Chronic low back pain (Bilateral) w/o sciatica   2. Grade 1 Anterolisthesis of lumbosacral spine of L3/L4 and L5/S1   3. Low back pain of over 3 months duration   4. Lumbar facet hypertrophy (Multilevel) (Bilateral)   5. Lumbar facet joint pain   6. Lumbar facet joint syndrome   7. Spondylosis without myelopathy or radiculopathy, lumbosacral  region     NAS-11 Pain score:        Pre-procedure: 3 /10        Post-procedure: 0-No pain/10   Post-Procedure Evaluation    Effectiveness:  Initial hour after procedure: 100 % . Subsequent 4-6 hours post-procedure: 100 % . Analgesia past initial 6 hours: 50 % (Current) Ongoing improvement: The patient reports 100% pain relief during the local anesthetic phase, followed by an ongoing 50% improvement, despite the diagnostic injection being performed without steroids. Analgesic:  50 % (Current) Function: Ms. Brandi [name] reports improvement in function ROM: Ms. Brandi [name] reports improvement in ROM Interpretation: Ms. Brandi [name] underwent a diagnostic/therapeutic lumbar facet block on June 10, 2024.  She reports 100% pain relief from the lower lumbar facet joints during local anesthetic phase, followed by sustained ongoing 50% improvement despite the diagnostic injection being performed without steroids.  Pharmacotherapy Assessment   Monitoring: [place] PMP: PDMP reviewed during this encounter.       Pharmacotherapy: No side-effects or adverse reactions reported. Compliance: No problems identified. Effectiveness: Clinically acceptable.  Jakie Chrissie MATSU, RN  06/23/2024  1:22 PM  Sign when Signing Visit Safety precautions to be maintained throughout the outpatient stay will include: orient to surroundings, keep bed in low position, maintain call bell within reach at all times, provide assistance with transfer out of bed and ambulation.     UDS:  No results found for: SUMMARY  No results found for: CBDTHCR No results  found for: D8THCCBX No results found for: D9THCCBX  ROS  Constitutional: Denies any fever or chills Gastrointestinal: No reported hemesis, hematochezia, vomiting, or acute GI distress Musculoskeletal: Bilateral low back pain (R>L) Neurological: No reported episodes of acute onset apraxia, aphasia, dysarthria, agnosia, amnesia, paralysis, loss of coordination, or loss of  consciousness  Medication Review  Cyanocobalamin , Melatonin, PreserVision/Lutein , SitaGLIPtin -MetFORMIN  HCl, acetaminophen , amLODipine , aspirin  EC, atorvastatin , budesonide-formoterol , carboxymethylcellulose, carvedilol , ferrous sulfate , furosemide , gabapentin , insulin  glargine, ipratropium, losartan , mirabegron  ER, nitroGLYCERIN , progesterone , and sodium chloride   History Review  Allergy: Ms. Brandi [name] is allergic to accupril [quinapril hcl], micardis [telmisartan], and quinapril. Drug: Ms. Brandi [name]  reports no history of drug use. Alcohol :  reports no history of alcohol  use. Tobacco:  reports that she has never smoked. She has never used smokeless tobacco. Social: Ms. Brandi [name]  reports that she has never smoked. She has never used smokeless tobacco. She reports that she does not drink alcohol  and does not use drugs. Medical:  has a past medical history of Acquired cyst of kidney, Anemia, Anxiety, Aortic atherosclerosis, Arthritis, degenerative, Atrial fibrillation and flutter (HCC), Baden-Walker grade 3 cystocele, Chronic cystitis, Coronary artery disease (08/05/2010), Depression, Diabetic neuropathy (HCC), Dyspnea, GERD (gastroesophageal reflux disease), Headache, History of left atrial appendage closure (05/29/2018), HLD (hyperlipidemia), HTN (hypertension), Implantable loop recorder present (09/20/2016), Insomnia, Murmur, NSTEMI (non-ST elevated myocardial infarction) (HCC) (05/20/2018), NSVT (nonsustained ventricular tachycardia) (HCC) (01/2022), Obesity, OSA on CPAP, PONV (postoperative nausea and vomiting), Presence of drug coated stent in LAD coronary artery, PSVT (paroxysmal supraventricular tachycardia) (01/2022), 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 Surgical: Ms. Brandi [name]  has a past surgical history that includes Cholecystectomy; Total knee arthroplasty  (Bilateral); Septoplasty; Bunionectomy; LOOP RECORDER INSERTION (N/A, 09/20/2016); Colonoscopy with propofol  (N/A, 06/14/2017); Breast biopsy (Right, 2004); LEFT HEART CATH AND CORONARY ANGIOGRAPHY (N/A, 05/22/2018); XI Robotic assisted inguinal hernia repair with mesh (Left, 10/14/2020); Coronary artery bypass graft (N/A, 05/29/2018); LEFT ATRIAL APPENDAGE OCCLUSION (Left, 05/29/2018); Coronary angioplasty with stent (Left, 08/05/2010); LEFT HEART CATH AND CORONARY ANGIOGRAPHY (Left, 07/03/2011); LEFT HEART CATH AND CORONARY ANGIOGRAPHY (Left, 05/22/2018); and Hysteroscopy with D & C (N/A, 12/14/2022). Family: family history includes Bladder Cancer in her maternal aunt; Breast cancer in her cousin; COPD in an other family member; Diabetes in an other family member; Hypercholesterolemia in an other family member; Hypertension in an other family member; Skin cancer in an other family member.  Laboratory Chemistry Profile   Renal Lab Results  Component Value Date   BUN 13 06/05/2024   CREATININE 0.97 06/05/2024   LABCREA 29 04/13/2020   GFRAA 54 (L) 05/24/2018   GFRNONAA 56 (L) 06/05/2024    Hepatic Lab Results  Component Value Date   AST 21 06/05/2024   ALT 13 06/05/2024   ALBUMIN 3.6 06/05/2024   ALKPHOS 76 06/05/2024   LIPASE 22 10/27/2023    Electrolytes Lab Results  Component Value Date   NA 137 06/05/2024   K 3.7 06/05/2024   CL 99 06/05/2024   CALCIUM  9.0 06/05/2024   MG 1.8 03/29/2024   PHOS 1.7 (L) 11/19/2023    Bone Lab Results  Component Value Date   25OHVITD1 68 08/15/2023   25OHVITD2 <1.0 08/15/2023   25OHVITD3 68 08/15/2023    Inflammation (CRP: Acute Phase) (ESR: Chronic Phase) Lab Results  Component Value Date   CRP 2 08/15/2023   ESRSEDRATE 2 08/15/2023   LATICACIDVEN 1.1 11/17/2023  Note: Above Lab results reviewed.  Recent Imaging Review  DG PAIN CLINIC C-ARM 1-60 MIN NO REPORT Fluoro was used, but no Radiologist interpretation will be  provided.  Please refer to NOTES tab for provider progress note. Note: Reviewed        Physical Exam  Vitals: BP (!) 160/80 (BP Location: Left Arm, Patient Position: Sitting, Cuff Size: Normal) Comment: patient denies any headache currently, states she had a headache this morning.  reported to provider. Comment (Cuff Size): manual  Pulse 71   Temp (!) 97.4 F (36.3 C) (Temporal)   Resp 16   Ht 5' 1 (1.549 m)   Wt 173 lb 12.8 oz (78.8 kg)   SpO2 98%   BMI 32.84 kg/m  BMI: Estimated body mass index is 32.84 kg/m as calculated from the following:   Height as of this encounter: 5' 1 (1.549 m).   Weight as of this encounter: 173 lb 12.8 oz (78.8 kg). Ideal: Ideal body weight: 47.8 kg (105 lb 6.1 oz) Adjusted ideal body weight: 60.2 kg (132 lb 12 oz) General appearance: Well nourished, well developed, and well hydrated. In no apparent acute distress Mental status: Alert, oriented x 3 (person, place, & time)       Respiratory: No evidence of acute respiratory distress Eyes: PERLA  Musculoskeletal: +LBP Assessment   Diagnosis Status  1. Chronic low back pain (Bilateral) w/o sciatica   2. Lumbar facet hypertrophy (Multilevel) (Bilateral)   3. Lumbar facet joint pain   4. Lumbar facet joint syndrome   5. Low back pain of over 3 months duration   6. Chronic low back pain (Right) w/o sciatica   7. Low back pain radiating to leg (Right)    Controlled Controlled Controlled   Updated Problems: No problems updated.  Plan of Care  Problem-specific:  Assessment and Plan    Chronic low back pain with lumbar facet arthropathy Chronic low back pain with right-sided radicular pain. Lumbar facet block relieved radicular pain significantly but only 25% relief for back pain. Gabapentin  and Tylenol  used for pain management. Steroids avoided due to diabetes. - Continue gabapentin  and Tylenol  for pain management.  Thoracic back pain New thoracic back pain from mid-thoracic area upwards,  worsened by activity. Bilateral lumbar facet block provided 50% relief on left side, less on right. - Follow-up with Dr. Kirk in one month for evaluation of thoracic back pain.      Ms. Brandi [name] has a current medication list which includes the following long-term medication(s): amlodipine , budesonide-formoterol , carvedilol , gabapentin , lantus  solostar, ipratropium, losartan , mirabegron  er, progesterone , and sodium chloride .  Pharmacotherapy (Medications Ordered): No orders of the defined types were placed in this encounter.  Orders:  No orders of the defined types were placed in this encounter.       Return in about 1 month (around 07/24/2024) for (F2F), eval by MD with Dr. Kennard .    Recent Visits Date Type Provider Dept  06/10/24 Procedure visit Tanya Glisson, MD [facility]  05/19/24 Office Visit Tanya Glisson, MD [facility]  04/24/24 Procedure visit Tanya Glisson, MD [facility]  Showing recent visits within past 90 days and meeting all other requirements Today's Visits Date Type Provider Dept  06/23/24 Office Visit Jessly Lebeck K, NP [facility]  Showing today's visits and meeting all other requirements Future Appointments Date Type Provider Dept  07/21/24 Appointment Tanya Glisson, MD [facility]  Showing future appointments within next 90 days and  meeting all other requirements  I discussed the assessment and treatment plan with the patient. The patient was provided an opportunity to ask questions and all were answered. The patient agreed with the plan and demonstrated an understanding of the instructions.  Patient advised to call back or seek an in-person evaluation if the symptoms or condition worsens.  I personally spent a total of 20 minutes in the care of the patient today including preparing to see the patient, getting/reviewing separately obtained history, performing a medically  appropriate exam/evaluation, counseling and educating, placing orders, referring and communicating with other health care professionals, documenting clinical information in the EHR, independently interpreting results, communicating results, and coordinating care.   Note by: Kymoni Monday K Jeovanni Heuring, NP (TTS and AI technology used. I apologize for any typographical errors that were not detected and corrected.) Date: 06/23/2024; Time: 3:12 PM

## 2024-06-23 ENCOUNTER — Encounter: Payer: Self-pay | Admitting: Nurse Practitioner

## 2024-06-23 ENCOUNTER — Ambulatory Visit: Attending: Nurse Practitioner | Admitting: Nurse Practitioner

## 2024-06-23 VITALS — BP 160/80 | HR 71 | Temp 97.4°F | Resp 16 | Ht 61.0 in | Wt 173.8 lb

## 2024-06-23 DIAGNOSIS — M79604 Pain in right leg: Secondary | ICD-10-CM | POA: Insufficient documentation

## 2024-06-23 DIAGNOSIS — M545 Low back pain, unspecified: Secondary | ICD-10-CM | POA: Insufficient documentation

## 2024-06-23 DIAGNOSIS — M47816 Spondylosis without myelopathy or radiculopathy, lumbar region: Secondary | ICD-10-CM | POA: Diagnosis not present

## 2024-06-23 DIAGNOSIS — M5459 Other low back pain: Secondary | ICD-10-CM | POA: Diagnosis not present

## 2024-06-23 DIAGNOSIS — G8929 Other chronic pain: Secondary | ICD-10-CM | POA: Diagnosis not present

## 2024-06-23 NOTE — Progress Notes (Signed)
 Safety precautions to be maintained throughout the outpatient stay will include: orient to surroundings, keep bed in low position, maintain call bell within reach at all times, provide assistance with transfer out of bed and ambulation.

## 2024-06-27 NOTE — Progress Notes (Signed)
 Today the history is gathered from: 100% - patient  0% - patient's husband today in clinic  REFERRING PHYSICIAN: Florencio Cara Endow* PRIMARY CARE PHYSICIAN:  Rudolpho Norleen Lenis, MD   IMPRESSION/PLAN  Brandi [name] is a 89 y.o. female presenting for evaluation of  SYNCOPE/ TREMOR/ HEADACHES/ MEMORY LOSS Assessment & Plan Recurrent syncope and orthostatic hypotension Recurrent syncope episodes likely related to blood pressure fluctuations. Previous EEGs were normal, reducing suspicion for seizures. Blood pressure readings at home show variability with occasional low readings. Current management involves medication adjustments, but symptoms persist. - Referred to blood pressure specialist for further evaluation and management. - Advised to monitor blood pressure at home and log readings. - Instructed to report any new symptoms suggestive of seizures, such as tongue biting, shaking, or staring spells.  Benign essential tremor Tremor is well-controlled except during eating, causing difficulty with utensils. Gabapentin  is currently used for management, but additional support is needed during meals. - Prescribed additional 100 mg gabapentin  tablets for use as needed during meals or other situations requiring increased hand stability. - Continue gabapentin  400 mg 3 times a day  Headache disorder Intermittent headaches, often occurring in the mornings. Possible contribution from sleep disturbances and obstructive sleep apnea. No current use of CPAP due to previous skin cancer treatment and habit discontinuation. - Encouraged resumption of CPAP use to address potential sleep apnea contribution to headaches. - Advised to discuss frequent nocturnal urination with primary care for potential management options.  Memory difficulty with word finding impairment Memory score remains at 30/30, indicating good overall memory function. Persistent word finding difficulty, particularly with names and  precise word retrieval. Previous speech therapy was completed. - Referred to cognitive therapy with a speech therapist for word finding difficulty and mild memory issues.  Mobility impairment and frequent falls Mobility is managed with home health physical therapy exercises. She uses a wheelchair for longer distances and continues exercises independently. - Continue current home health physical therapy exercises independently. - Advised to contact if there are changes in mobility or if additional physical therapy is desired.   Follow-up in 6 months   CHIEF COMPLAIN & HISTORY OF PRESENT ILLNESS:  Brandi [name] is a 89 y.o. female presenting for evaluation of: Chief Complaint  Patient presents with    SYNCOPE/ TREMORS/ MEMORY LOSS/ HEADACHES    History of Present Illness  Brandi [name] is an 89 year old female with orthostatic hypotension and recurrent syncope who presents for follow-up. She is accompanied by her son.  She has a history of orthostatic hypotension and recurrent syncope, with multiple episodes of syncope. One significant episode occurred during a pulmonology appointment, coinciding with a urinary tract infection. She has been hospitalized frequently, with stays lasting up to four days for medication adjustments. Despite these adjustments, her medication regimen remains inconsistent, leading to ongoing symptoms. Routine and three-hour EEGs have been performed, both of which were within normal limits.  She has hypertension with fluctuating blood pressure, monitored at home using a wrist cuff. Readings are mostly high, with occasional low readings. She is following up with cardiology for this issue.  She experiences intermittent headaches, often starting in the morning. Frequent nocturia disrupts her sleep, and she wakes up early. She has a history of sleep apnea but is not using a CPAP machine due to past skin cancer treatment on her head.  She takes gabapentin  400 mg three times  a day for low back pain, headaches, and tremor. Tremors are manageable except when eating,  requiring her to use a spoon or be assisted by her son when dining out. Weighted silverware helps at home but is impractical when eating out.  Her memory is generally good, scoring 30 out of 30 on her last memory test, but she experiences significant word-finding difficulties. She needs to change topics during conversations when she cannot find the right word. 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  She uses a wheelchair for mobility outside the home, such as at church, but walks at home. She previously participated in home health physical therapy, which she found beneficial, and continues to do some exercises independently.   DATA SUMMARY: 03/28/2024 CT HEAD WO CONTRAST IMPRESSION:  1. No acute intracranial abnormality.  2. No acute fracture or traumatic malalignment of the cervical spine.   03/28/2024 CT CERVICAL SPINE WO CONTRAST IMPRESSION:  1. No acute intracranial abnormality.  2. No acute fracture or traumatic malalignment of the cervical spine.   03/25/2024 CT HEAD WO CONTRAST IMPRESSION:  1. No acute intracranial abnormality.  2. Progressive paranasal sinus opacification from prior exam. Fluid  levels in the left maxillary sinus, can be seen with acute  sinusitis.   02/18/2024 IMPRESSION:  1. Right parietal scalp hematoma. No acute intracranial abnormality.  No skull fracture.  2. Stable atrophy and chronic small vessel ischemia.  3. Chronic paranasal sinus disease.   02/18/2024 CT CERVICAL SPINE WO CONTRAST IMPRESSION:  1. No acute fracture or traumatic subluxation of the cervical spine.  2. Multilevel degenerative disc disease and facet hypertrophy.   12/27/2023 3 HOUR EEG IMPRESSION: This extended in-office continuously monitored video EEG in the awake and asleep states is within normal limits.  11/15/2023 EEG  ADULT AT [place] IMPRESSION:  This study is within normal limits. No seizures or epileptiform  discharges were seen throughout the recording.   11/14/2023 MR BRAIN WO CONTRAST IMPRESSION:  1. No acute intracranial abnormality.  2. Extensive chronic small vessel ischemic disease.   05/10/2023 MR BRAIN WO CONTRAST IMPRESSION:  1. No acute intracranial abnormality.  2. Moderately advanced chronic microvascular ischemic disease.  3. Scattered paranasal sinus disease with small air-fluid level  within the left sphenoid sinus. Clinical correlation for possible  acute sinusitis recommended.   05/10/2023 CT HEAD WO CONTRAST IMPRESSION:  No acute intracranial abnormality noted.   02/03/2022 MR BRAIN WO CONTRAST IMPRESSION:  1. No acute intracranial abnormality.  2. Findings of chronic small vessel ischemia.   02/02/2022 CT HEAD WO CONTRAST IMPRESSION:  Chronic ischemic changes are noted.  No acute abnormality noted.   07/24/2021:  CT MAXILLOFACIAL WITHOUT CONTRAST Impression: 1. No acute traumatic injury identified in the face. 2. Stable chronic sphenoid sinusitis. 3. Bulky calcified carotid atherosclerosis in the neck.  07/24/2021:  CT CERVICAL SPINE WITHOUT CONTRAST Impression: 1. No acute traumatic injury identified in the cervical spine. 2. Widespread cervical spine degeneration. C3-C4 ankylosis. No cervical spinal stenosis suspected. 3. Bulky calcified carotid and left subclavian artery Atherosclerosis.  07/24/2021:  CT HEAD WITHOUT CONTRAST Impression: 1. No acute intracranial abnormality or acute traumatic injury identified. 2. Stable non contrast CT appearance of chronic white matter disease. 3. Stable chronic sphenoid sinusitis.   EMG LOWERS Impression: Abnormal study.  There is electrodiagnostic evidence of a chronic severe sensorimotor polyneuropathy in the legs.  Ct HEAD WITHOUT 02/18/21 IMPRESSION:  1. No evidence of acute intracranial  abnormality.  2. Moderate chronic small vessel ischemic disease.  VISIT SUMMARIES:   01/25/21: Stable. Head CT without to rule out stroke.Memory score at next visit.EMG lowers to evaluate for neuropathy.Increase gabapentin  to 300mg  twice daily for tremors and numbness/tingling.Call if you decide you want to do gait and balance PT.Continue nortriptyline  25mg  nightly.Continue Effexor  37.5mg  twice daily.  07/23/2019:Refilled nortriptyline  25 mg and  Effexor  37.5 mg twice a daEncouraged her to continue to monitor her BPRecommend she wear CPAP nightly and with napsRefill Gabapentin  200 mg twice a day  09/04/17: Improving headache. Refill Effexor  37.5 mg twice daily and Nortriptyline  25 mg nightly. Tremors stable. Refill Gabapentin  200 mg twice daily. Hypertension ongoing. Advised patient to check BP at home and call with results.   06/06/2017:  Improved headache and tremors. No recent falls. Continue taking Effexor  37.5 mg twice a day for headaches. Continue taking Gabapentin  200 mg twice a day for tremors. Recommend balance exercises. Recommend that patient track BP twice a day for one week. Continue all BP medications.   01/24/2017: Patient with headaches, hypertension, OSA, and tremors. Refill effexor  37.5 mg twice a day. Refill nortriptyline  25 mg nightly. Recommend starting gabapentin  100 mg twice a day. Once in the morning, once in the afternoon. If symptoms do not improve increase to 200 mg twice a day. Recommend find sleep study report, contact [facility], and check setting updates and qualification for new machine or mask.  09/11/2016: Patient with headaches, hypertension, osa and tremors. Recommend Effexor  37.5 once a day for a week, then increase to twice a day. Recommend monitor BP at home. Will consider gabapentin  or propanolol at next visit.  MEDICATIONS Current Outpatient Medications  Medication Sig Dispense Refill   acetaminophen  (TYLENOL ) 500 MG tablet Take 1,000 mg by mouth 2 (two)  times daily        aspirin  81 MG EC tablet Take 81 mg by mouth once daily.     atorvastatin  (LIPITOR) 40 MG tablet TAKE 1 TABLET(40 MG) BY MOUTH EVERY NIGHT 90 tablet 1   BD ULTRA-FINE SHORT PEN NEEDLE 31 gauge x 5/16 needle USE ONCE DAILY AS DIRECTED 100 each 3   blood glucose diagnostic (ACCU-CHEK AVIVA PLUS TEST STRP) test strip 3 (three) times daily Use as instructed to test blood sugar 200 strip 4   blood-glucose sensor (FREESTYLE LIBRE 3 SENSOR) Use 1 each every 10 (ten) days     carboxymethylcellulose (REFRESH TEARS) 0.5 % ophthalmic solution Place 1 drop into both eyes nightly     carvediloL  (COREG ) 25 MG tablet Take 0.5 tablets (12.5 mg total) by mouth 2 (two) times daily with meals (Patient taking differently: Take 25 mg by mouth 2 (two) times daily with meals) 90 tablet 3   cholecalciferol 1000 unit tablet Take by mouth     cyanocobalamin  (VITAMIN B12) 1000 MCG tablet Take 1,000 mcg by mouth once daily       ferrous sulfate  325 (65 FE) MG tablet Take 325 mg by mouth once daily     fluticasone  propionate (FLONASE ) 50 mcg/actuation nasal spray Place 1 spray into both nostrils 2 (two) times daily 16 g 0   FUROsemide  (LASIX ) 20 MG tablet TAKE 1 TABLET(20 MG) BY MOUTH EVERY OTHER DAY 15 tablet 3   gabapentin  (NEURONTIN ) 400 MG capsule TAKE 1 CAPSULE(400 MG) BY MOUTH THREE TIMES DAILY 90 capsule 2   hydrALAZINE  (APRESOLINE ) 10 MG tablet Take 1 tablet (10 mg total) by mouth 2 (two) times daily 60 tablet 11   insulin  GLARGINE (LANTUS  SOLOSTAR U-100 INSULIN ) pen injector (concentration 100 units/mL)  INJECT 18 UNITS UNDER THE SKIN ONCE DAILY (Patient taking differently: INJECT 8 UNITS UNDER THE SKIN ONCE DAILY) 15 mL 3   ipratropium (ATROVENT) 0.06 % nasal spray Place 2 sprays into both nostrils 3 (three) times daily as needed for Rhinitis 15 mL 0   lancets (ACCU-CHEK SOFTCLIX LANCETS) USE AS DIRECTED TWICE DAILY 200 each 3   losartan  (COZAAR ) 100 MG tablet Take 0.5 tablets (50  mg total) by mouth at bedtime (Patient taking differently: Take 100 mg by mouth at bedtime) 90 tablet 3   meclizine  (ANTIVERT ) 12.5 mg tablet Take 12.5 mg 1-3 times daily as needed for vertigo 30 tablet 1   melatonin 10 mg Tab Take 1 tablet by mouth nightly     mirabegron  (MYRBETRIQ ) 50 mg ER tablet Take 50 mg by mouth once daily     multivitamin with minerals, EYE, (PRESERVISION AREDS 2) soft gel capsule Take 1 capsule by mouth 2 (two) times daily with meals        nitroGLYcerin  (NITROSTAT ) 0.4 MG SL tablet Place 1 tablet (0.4 mg total) under the tongue every 5 (five) minutes as needed for Chest pain May take up to 3 doses. 25 tablet 11   polyethylene glycol (MIRALAX ) powder Mix in 4-8ounces of fluid prior to taking. 238 g 0   SITagliptin  phos-metFORMIN  (JANUMET  XR) 100-1,000 mg ER 24 hr multiphase tablet Take 1 tablet by mouth daily with dinner 90 tablet 3   SYMBICORT 80-4.5 mcg/actuation inhaler INHALE 2 PUFFS INTO THE LUNGS TWICE DAILY 10.2 g 2   triamcinolone  0.5 % cream APPLY TOPICALLY TO THE AFFECTED AREA TWICE DAILY 30 g 1   carvedilol  (COREG ) 12.5 MG tablet TAKE 1 TABLET(12.5 MG) BY MOUTH TWICE DAILY WITH MEALS (Patient not taking: Reported on 06/27/2024) 180 tablet 1   progesterone  (PROMETRIUM ) 100 MG capsule Take 1 capsule (100 mg total) by mouth at bedtime (Patient not taking: Reported on 06/27/2024) 30 capsule 0   spironolactone  (ALDACTONE ) 25 MG tablet Take 25 mg by mouth once daily (Patient not taking: Reported on 06/27/2024)     verapamiL  (VERELAN  PM) 300 mg ER capsule TAKE ONE CAPSULE BY MOUTH AT BEDTIME (Patient not taking: Reported on 06/27/2024) 90 capsule 1   No current facility-administered medications for this visit.    ALLERGIES Allergies  Allergen Reactions   Accupril [Quinapril] Rash   Micardis [Telmisartan] Dizziness     EXAM   Vitals:   06/27/24 1108  BP: (!) 146/92  PainSc: 0-No pain    There is no height or weight on file to calculate BMI.    Memory evaluation 06/27/2024 - 30/30 12/24/2023 - 30/30 11/29/2022-30/30 04/27/2021 - 30/30  GENERAL: Pleasant female, no distress. Normocephalic and atraumatic.  Baseline neurological exam below was obtained at prior office visit. Changes from today's visit appear in bold.    MUSCULOSKELETAL: Bulk - Obese. Tone - Normal Pronator Drift - Absent bilaterally. Ambulation - Gait and station are not assessed; in wheelchair today. Romberg: not tested for safety.   NEUROLOGICAL: MENTAL STATUS: Patient is oriented to person, place and time.   Recent memory is intact.   Remote memory is intact.   Attention span and concentration are intact.   Naming and repetition are intact. Comprehension is intact.   Expressive speech is intact.   Patient's fund of knowledge is normal for educational level.  CRANIAL NERVES: Visual acuity and visual fields are intact         Extraocular muscles are intact  Facial sensation is intact bilaterally                Facial strength is intact bilaterally                   Hearing is intact bilaterally                              Palate elevates midline, normal phonation     Shoulder shrug strength is intact                     COORDINATION/CEREBELLAR: Finger to nose testing is not tested      PAST MEDICAL HISTORY Past Medical History:  Diagnosis Date   Anemia, unspecified    ASCVD (arteriosclerotic cardiovascular disease)    Coronary arteriosclerosis due to lipid rich plaque 01/06/2014   status post PCI of proximal LAD with a Xience 3.0 x 23 mm drug-eluting stent    Coronary artery disease    Essential hypertension, benign    Hemorrhage of rectum and anus    Left wrist fracture 04/2016   Obesity    On continuous heparin  infusion 05/27/2018   Osteoarthritis    Other and unspecified hyperlipidemia    Pelvic fracture (CMS/HHS-HCC)    Pernicious anemia    Presence of drug coated stent in LAD coronary  artery 05/27/2018   Status post PCI of proximal LAD with a Xience 3.0 x 23 mm drug-eluting stent    PUD (peptic ulcer disease)    Recurrent UTI    Followed by Dr. Twylla   Right ulnar fracture 07/2021   Spinal stenosis    Type 2 diabetes mellitus (CMS/HHS-HCC)     PAST SURGICAL HISTORY Past Surgical History:  Procedure Laterality Date   ERCP  1998   KNEE ARTHROSCOPY Right 2004   Total knee Replacement   KNEE ARTHROSCOPY Left 09/2008   Total Knee Replacement   coronary stent  2012   COLONOSCOPY  06/14/2017   Tubular adenoma of the colon/Repeat 62yrs if desired/MUS   CORONARY ARTERY BYPASS W/SINGLE ARTERY GRAFT N/A 05/29/2018   Procedure: CORONARY ARTERY BYPASS, USING ARTERIAL GRAFT(S); SINGLE ARTERIAL GRAFT, Left internal mammary artery harvest;  Surgeon: Dora Bernardino Dover, MD;  Location: [facility];  Service: Cardiothoracic;  Laterality: N/A;   CORONARY ARTERY BYPASS W/VEIN ONLY N/A 05/29/2018   Procedure: CORONARY ARTERY BYPASS, USING VENOUS GRAFT(S) AND ARTERIAL GRAFT(S);SINGLE VEIN GRAFT (LIST IN ADDITION TO PRIMARY PROCEDURE);  Surgeon: Dora Bernardino Dover, MD;  Location: [facility];  Service: Cardiothoracic;  Laterality: N/A;   APPLICATION WOUND VAC N/A 05/29/2018   Procedure: NEGATIVE PRESSURE WOUND THERAPY, INCLUDING TOPICAL APPLICATION(S), ASSESSMENT, INSTRUCTION(S) FOR ONGOING CARE, PER SESSION; TOTAL WOUND(S) SURFACE AREA LESS THAN OR EQUAL TO 50 SQUARE CENTIMETERS;  Surgeon: Dora Bernardino Dover, MD;  Location: [facility];  Service: Cardiothoracic;  Laterality: N/A;   Fractional D&C with hysteroscopy  12/14/2022   Bunionectomy Right    CARDIAC CATHETERIZATION     CHOLECYSTECTOMY     CORONARY ANGIOPLASTY     status post PCI of proximal LAD with a Xience 3.0 x 23 mm drug-eluting stent   CORONARY ARTERY BYPASS GRAFT     REPAIR NASAL SEPTAL PERFORATIONS      FAMILY HISTORY Family History  Problem Relation Name Age of  Onset   Alzheimer's disease Mother     Myocardial Infarction (Heart attack) Mother     No  Known Problems Father     No Known Problems Sister     No Known Problems Brother     No Known Problems Daughter     No Known Problems Son     Autism Maternal Aunt     No Known Problems Maternal Uncle     No Known Problems Paternal Aunt     No Known Problems Paternal Uncle     No Known Problems Maternal Grandmother     No Known Problems Maternal Grandfather     No Known Problems Paternal Grandmother     No Known Problems Paternal Grandfather      SOCIAL HISTORY  Social History   Tobacco Use   Smoking status: Never   Smokeless tobacco: Never  Vaping Use   Vaping status: Never Used  Substance Use Topics   Alcohol  use: No    Alcohol /week: 0.0 standard drinks of alcohol    Drug use: Never     REVIEW OF SYSTEMS:  13 system ROS was verbally reviewed with patient. Pertinent positives and negatives are mentioned above in the HPI and all other systems are negative.   DATA   ___________________________________________________________  10/01/2015 Syncopal episode today. Headache for the past 6 weeks  EXAM: CT HEAD WITHOUT CONTRAST  TECHNIQUE: Contiguous axial images were obtained from the base of the skull through the vertex without intravenous contrast.  COMPARISON:  05/06/2015.  FINDINGS: Diffusely enlarged ventricles and subarachnoid spaces. Patchy white matter low density in both cerebral hemispheres. No skull fracture, intracranial hemorrhage, mass lesion CT evidence of acute infarction. The small amount fluid is noted in the sphenoid sinus on the left.  IMPRESSION: 1. No acute intracranial abnormality. 2. Stable mild atrophy and mild chronic small vessel white matter ischemic changes. 3. Mild acute left sphenoid sinusitis. ___________________________________________________________  Appointment on 06/18/2024  Component Date Value Ref Range Status    Color 06/18/2024 Light Yellow  Colorless, Straw, Light Yellow, Yellow, Dark Yellow Final   Clarity 06/18/2024 Cloudy (!)  Clear Final   Specific Gravity 06/18/2024 1.013  1.005 - 1.030 Final   pH, Urine 06/18/2024 7.0  5.0 - 8.0 Final   Protein, Urinalysis 06/18/2024 Negative  Negative mg/dL Final   Glucose, Urinalysis 06/18/2024 Negative  Negative mg/dL Final   Ketones, Urinalysis 06/18/2024 Negative  Negative mg/dL Final   Blood, Urinalysis 06/18/2024 Negative  Negative Final   Nitrite, Urinalysis 06/18/2024 Negative  Negative Final   Leukocyte Esterase, Urinalysis 06/18/2024 3+ (!)  Negative Final   Bilirubin, Urinalysis 06/18/2024 Negative  Negative Final   Urobilinogen, Urinalysis 06/18/2024 0.2  0.2 - 1.0 mg/dL Final   WBC, UA 87/68/7974 >182 (H)  <=5 /hpf Final   Red Blood Cells, Urinalysis 06/18/2024 0  <=3 /hpf Final   Bacteria, Urinalysis 06/18/2024 5-50 (!)  0 - 5 /hpf Final   Squamous Epithelial Cells, Urinaly* 06/18/2024 0  /hpf Final   Urine Culture, Routine - [facility] 06/18/2024 Final report (!)   Final   Result 1 - [facility] 06/18/2024 Klebsiella pneumoniae (!)   Final   Antimicrobial Susceptibility - Lab* 06/18/2024 Comment   Final  Office Visit on 06/02/2024  Component Date Value Ref Range Status   Vent Rate (bpm) 06/02/2024 67   Final   PR Interval (msec) 06/02/2024 138   Final   QRS Interval (msec) 06/02/2024 76   Final   QT Interval (msec) 06/02/2024 412   Final   QTc (msec) 06/02/2024 435   Final  Office Visit on 05/26/2024  Component Date  Value Ref Range Status   Hemoglobin A1C 05/26/2024 8.7 (H)  4.2 - 5.6 % Final   Average Blood Glucose (Calc) 05/26/2024 203  mg/dL Final  Ancillary Procedure on 05/21/2024  Component Date Value Ref Range Status   LV Ejection Fraction (%) 05/21/2024 55  % Final   Right Ventricle Systolic Pressure * 05/21/2024 40  mmHg Final   Left Atrium Diameter (cm) 05/21/2024 4.1  cm Final   LV End Diastolic  Diameter (cm) 05/21/2024 4.3  cm Final   LV End Systolic Diameter (cm) 05/21/2024 3.2  cm Final   LV Septum Wall Thickness (cm) 05/21/2024 1.1  cm Final   LV Posterior Wall Thickness (cm) 05/21/2024 1.1  cm Final   Tricuspid Valve Regurgitation Grade 05/21/2024 mild   Final   Tricuspid Valve Regurgitation Max * 05/21/2024 3.1  m/s Final   Mitral Valve Regurgitation Grade 05/21/2024 mild   Final   Mitral Valve Stenosis Grade 05/21/2024 none   Final   Aortic Valve Regurgitation Grade 05/21/2024 none   Final   Aortic Valve Stenosis Grade 05/21/2024 trivial   Final   Aortic Valve Stenosis Mean Gradien* 05/21/2024 12  mmHg Final   Aortic Valve Max Velocity (m/s) 05/21/2024 2.4  m/s Final  Appointment on 05/07/2024  Component Date Value Ref Range Status   Cortisol 05/07/2024 16.8  6.7 - 22.6 ug/dL Final   No follow-ups on file.  Payor: [employer] PLANS / Plan: [employer] PPO CHOICE / Product Type: PPO /     This note is partially written by Roe Mater, in the presence of and acting as the scribe of Lauraine Rocks, PA-C.    I agree that the scribe documentation is complete and accurate.  This note was generated in part with voice recognition software and I apologize for any typographical errors that were not detected and corrected.     Attestation Statement:   I personally performed the service, non-incident to. [facility])   SARAH ELIZABETH MASON, PA       This note has been created using automated tools and reviewed for accuracy by [facility].

## 2024-07-16 ENCOUNTER — Ambulatory Visit

## 2024-07-16 DIAGNOSIS — R41841 Cognitive communication deficit: Secondary | ICD-10-CM | POA: Insufficient documentation

## 2024-07-16 NOTE — Therapy (Signed)
 " OUTPATIENT SPEECH LANGUAGE PATHOLOGY  EVALUATION   Patient Name: Brandi [name] MRN: [id number] DOB:1936-05-23, 89 y.o., female Today's Date: 07/16/2024  PCP: Norleen Rower, MD REFERRING PROVIDER: Arthea Farrow, MD   End of Session - 07/16/24 1201     Visit Number 1    Number of Visits 4    Date for Recertification  08/13/24    SLP Start Time 1108    SLP Stop Time  1150    SLP Time Calculation (min) 42 min    Activity Tolerance Patient tolerated treatment well          Patient Active Problem List   Diagnosis Date Noted   Chronic low back pain (Bilateral) w/o sciatica 04/23/2024   Low back pain of over 3 months duration 04/23/2024   Multifactorial low back pain 04/23/2024   Hypokalemia 03/27/2024   Paroxysmal atrial fibrillation (HCC) 03/26/2024   Hypertensive urgency 03/25/2024   Hyperlipidemia, unspecified 03/25/2024   Type 2 diabetes mellitus with peripheral neuropathy (HCC) 03/25/2024   Headache 03/25/2024   PAC (premature atrial contraction) 03/25/2024   Obesity (BMI 30-39.9) 03/25/2024   Abnormal NCS (nerve conduction studies) (03/07/2021) 02/11/2024   Polyneuropathy, peripheral sensorimotor axonal 02/11/2024   Diabetic sensorimotor polyneuropathy (HCC) 02/11/2024   Chronic painful diabetic neuropathy (HCC) 02/11/2024   SIRS (systemic inflammatory response syndrome) (HCC) 11/17/2023   Essential hypertension 11/14/2023   Spondylosis without myelopathy or radiculopathy, lumbosacral region 11/13/2023   Chronic low back pain (Right) w/o sciatica 10/30/2023   Lumbar facet joint syndrome 10/30/2023   Chronic lower extremity pain (2ry area of Pain) (Right) 09/04/2023   Lumbosacral radiculopathy at L5 (Right) 09/04/2023   Lumbosacral radiculopathy at S1 (Right) 09/04/2023   DDD, lumbosacral w/ LBP & LEP 09/04/2023   Lumbar facet hypertrophy (Multilevel) (Bilateral) 09/04/2023   Lumbar foraminal stenosis 09/04/2023   Chronic pain syndrome 08/15/2023   Pharmacologic  therapy 08/15/2023   Disorder of skeletal system 08/15/2023   Problems influencing health status 08/15/2023   Hypocalcemia 08/15/2023   Decreased GFR 08/15/2023   Elevated hemoglobin A1c 08/15/2023   Abnormal CT scan, cervical spine (07/24/2021) 08/15/2023   Abnormal MRI, lumbar spine (05/09/2023) 08/15/2023   Chronic low back pain (1ry area of Pain) (Bilateral) (R>L) w/ sciatica (Right) 08/15/2023   Low back pain radiating to leg (Right) 08/15/2023   Pain of lateral upper thigh (Right) (Intermittent) 08/15/2023   Chronic sacroiliac joint pain (Right) 08/15/2023   Somatic dysfunction of sacroiliac joint (Right) 08/15/2023   Grade 1 Anterolisthesis of lumbosacral spine of L3/L4 and L5/S1 08/15/2023   Lumbar facet joint pain 08/15/2023   Hypertensive emergency 05/10/2023   Peripheral vertigo 05/10/2023   Acute sinusitis 05/10/2023   A-fib s/p ligation of left atrial appendage 05/10/2023   Thickened endometrium 09/13/2022   Anemia of chronic disease 02/05/2022   Status post laparoscopic hernia repair 10/28/2020   AKI (acute kidney injury) 04/13/2020   Acute hyponatremia 04/13/2020   Dehydration 04/13/2020   Diarrhea 04/13/2020   Abdominal pain 04/13/2020   Recurrent syncope 03/28/2020   CAD s/p CABG (coronary artery disease) 03/28/2020   Chronic kidney disease, stage III (moderate) (HCC) 02/12/2019   Background diabetic retinopathy associated with type 2 diabetes mellitus (HCC) 12/06/2018   Decreased activities of daily living (ADL) 06/05/2018   Weakness 06/05/2018   S/P CABG x 3 06/04/2018   Presence of drug coated stent in LAD coronary artery 05/27/2018   Anxiety 05/25/2018   H/O gastroesophageal reflux (GERD) 05/25/2018   Hyperlipidemia 05/25/2018  Unstable angina (HCC) 05/21/2018   Chest pain 05/20/2018   Sacroiliitis 02/21/2018   Positional lightheadedness 03/14/2017   Diabetes mellitus type 2, insulin  dependent (HCC) 03/14/2017   Action tremor 09/11/2016   Headache  disorder 09/11/2016   Severe sepsis (HCC) 04/14/2016   Orthostatic hypotension 11/10/2015   Diabetic neuropathy (HCC) 10/06/2015   Urinary tract infection without hematuria 09/13/2015   Cystocele, grade 3 09/13/2015   Incontinence 09/13/2015   Depression, major, in remission 12/23/2014   Pernicious anemia 12/23/2014   Coronary arteriosclerosis 01/06/2014   OSA on CPAP 01/06/2014   Benign essential hypertension 12/01/2013   Severe obesity (BMI 35.0-39.9) with comorbidity (HCC) 12/01/2013   Acquired cyst of kidney 05/02/2013   Chronic cystitis 03/11/2012   Urge incontinence 03/11/2012    ONSET DATE: referral date 06/30/24   REFERRING DIAG: memory loss; wordfinding difficulty  THERAPY DIAG:  Cognitive communication deficit  Rationale for Evaluation and Treatment Rehabilitation  SUBJECTIVE:   SUBJECTIVE STATEMENT: Pt alert, pleasant, and cooperative.  Pt accompanied by: family member; son, Rad  PERTINENT HISTORY & DIAGNOSTIC FINDINGS: Per Neurology, Brandi [name] is an 89 year old female with orthostatic hypotension and recurrent syncope... She experiences intermittent headaches, often starting in the morning. Frequent nocturia disrupts her sleep, and she wakes up early. She has a history of sleep apnea but is not using a CPAP machine due to past skin cancer treatment on her head. She takes gabapentin  400 mg three times a day for low back pain, headaches, and tremor. Tremors are manageable except when eating, requiring her to use a spoon or be assisted by her son when dining out. Weighted silverware helps at home but is impractical when eating out. Her memory is generally good, scoring 30 out of 30 on her last memory test, but she experiences significant word-finding difficulties. She needs to change topics during conversations when she cannot find the right word. 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 CT Head, 03/28/24, no  acute findings.   PAIN:  Are you having pain? No   FALLS: Has patient fallen in last 6 months?  Yes  LIVING ENVIRONMENT: Lives with: lives with their family Lives in: House/apartment  PLOF:  Level of assistance: Needed assistance with ADLs, Needed assistance with IADLS Employment: Retired   PATIENT GOALS   for communication to improve  OBJECTIVE:   COGNITIVE COMMUNICATION: Overall cognitive status: Impaired   AUDITORY COMPREHENSION: Overall auditory comprehension: Appears intact    READING COMPREHENSION: Intact  EXPRESSION: verbal  VERBAL EXPRESSION: Level of generative/spontaneous verbalization: sentence and conversation Automatic speech: name: intact and social response: intact  Repetition: Appears intact Naming: Confrontation: WFL and Divergent: reduced 10 p words and 9 animals in 60s Pragmatics: Appears intact  WRITTEN EXPRESSION: Dominant hand: right   Written expression: Appears intact  MOTOR SPEECH: Overall motor speech: Appears intact   ORAL MOTOR EXAMINATION: No functional deficits  STANDARDIZED ASSESSMENTS: Addenbrooke's Cognitive Examination - ACE III The Addenbrooke's Cognitive Examination-III (ACE-III) is a brief cognitive test that assesses five cognitive domains. The total score is 100 with higher scores indicating better cognitive functioning. Cut off scores of 88 and 82 are recommended for suspicion of dementia (88 has sensitivity of 1.00 and specificity of 0.96, 82 has sensitivity of 0.93 and specificity of 1.00). American Version A  Attention 18/18  Memory 26/26  Fluency 7/14  Language 25/26  Visuospatial 16/16  TOTAL ACE- III Score 92/100  PATIENT REPORTED OUTCOME MEASURES (PROM): Communicative Participation Item Bank (CPIB) Age Range: 18+   The Communicative Participation Item Bank (CPIB) is a patient-reported outcomes instrument that targets the construct of communicative participation [employer] et al., 2013). The CPIB  was developed with the intent of being appropriate for community-dwelling adults in the variety of conversational situations they frequently engage in as part of life roles at home, at work, and in social and leisure situations. All items in the CPIB start with the stem, Does your condition interfere with. followed by various conversational situations such as Making a phone call to get information, or Having a conversation in a noisy place. Respondents choose from four response categories to rate the level of interference they experience in that situation, ranging from Not at all, to Very much. The item bank consists of 46 items, and a paper-and-pencil 10-item disorder-generic short form is available. The CPIB scores are reported as T-scores where 50 = the mean of the calibration sample and standard deviation = 10.  Does your condition interfere with talking with people you know? QUITE A BIT (value=1) Does your condition interfere with communicating when you need to say something quickly? QUITE A BIT (value=1) Does your condition interfere with talking with people you do NOT know?  A LITTLE (value=2) Does your condition interfere with communicating when you are out in the community (e.g., running errands, appointments)?  NOT AT ALL (value=3) Does your condition interfere with asking questions in a conversation?  NOT AT ALL (value=3) Does your condition interfere with communicating in a small group of people?  NOT AT ALL (value=3) Does your condition interfere with having a long conversation with someone you know about a book, movie, show or sports event? VERY MUCH (value=0) Does your condition interfere with giving someone DETAILED information? QUITE A BIT (value=1) Does your condition interfere with getting your turn in a fast-moving conversation? QUITE A BIT (value=1) Does your condition interfere with trying to persuade a friend or family member to see a different point of view?  NOT AT ALL  (value=3)  Pt's T-Score is 43.3 which is considered .67 SD mean below of 50.    TODAY'S TREATMENT:  Pt educated re: role of SLP, rationale for assessment, domains of cognition, results of assessment, and plan for f/u education re: general communication strategies for anomia as well as normal cognitive changes in the aging brain and ways to promote cognitive functioning.    PATIENT EDUCATION: Education details: as above Person educated: Patient and Child(ren) Education method: Explanation Education comprehension: verbalized understanding and needs further education  HOME EXERCISE PROGRAM:        To be provided in upcoming sessions    GOALS:  Goals reviewed with patient? Yes  SHORT TERM GOALS: Target date: 4 sessions  Pt will use preferred communication strategy (e.g. semantic features, circumlocution strategy, writing, drawing, and/or gesturing) to describe target words with 75% accuracy to improve word-finding and reduce communication breakdowns. Baseline: Goal status: INITIAL   2.  Pt will ID x3 normal changes to cognition in the aging brain.  Baseline:  Goal status: INITIAL  3.  Pt will ID x3 ways to promote cognitive functioning.  Baseline:  Goal status: INITIAL    LONG TERM GOALS: Target date: 1 month  Pt will demonstrate understanding of normal changes to aging (vs red flags for cognitive decline) as well as ways to promote cognitive functioning.  Baseline:  Goal status: INITIAL    ASSESSMENT:  CLINICAL IMPRESSION:  Patient  is a 89 y.o. female who was seen today for cognitive-communication evaluation. Brandi [name] is an 89 year old female with orthostatic hypotension and recurrent syncope... She experiences intermittent headaches, often starting in the morning. Frequent nocturia disrupts her sleep, and she wakes up early. She has a history of sleep apnea but is not using a CPAP machine due to past skin cancer treatment on her head. She takes gabapentin  400 mg  three times a day for low back pain, headaches, and tremor. Tremors are manageable except when eating, requiring her to use a spoon or be assisted by her son when dining out. Weighted silverware helps at home but is impractical when eating out. Her memory is generally good, scoring 30 out of 30 on her last memory test, but she experiences significant word-finding difficulties. She needs to change topics during conversations when she cannot find the right word. 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 CT Head, 03/28/24, no acute findings.   Assessment completed via informal means, standardized assessment (ACE-III), and PROM (CPIB). Pt scored 92/100 on ACE-III which is above the cutoff scores of 82 and 88. Pt with reports of changes to functional verbal communication including wordfinding difficulty which is impacting her QoL per PROM. Pt scored ~1 SD below average on PROM for cognition. Pt endorsed frustration with CLOF, but relief that such changes may be typical in aging.    Recommend brief course of ST for education re: anomia strategies as well as normal changes to cognition with aging (vs red flags for decline) and ways to promote cognitive functioning.   OBJECTIVE IMPAIRMENTS include expressive language. These impairments are limiting patient from effectively communicating at home and in community. Factors affecting potential to achieve goals and functional outcome are none.. Patient will benefit from skilled SLP services to address above impairments and improve overall function.  REHAB POTENTIAL: Good  PLAN: SLP FREQUENCY: 1x/week  SLP DURATION: 4 weeks  PLANNED INTERVENTIONS: Language facilitation, Cueing hierachy, Internal/external aids, Functional tasks, Multimodal communication approach, SLP instruction and feedback, Compensatory strategies, and Patient/family education    Delon Bangs, M.S., CCC-SLP Speech-Language  Pathologist [facility] [phone] [place])  [facility] at [facility] [address] [place], [place], [address] Phone: [phone]   Fax:  [phone]            "

## 2024-07-17 ENCOUNTER — Other Ambulatory Visit: Payer: Self-pay

## 2024-07-17 DIAGNOSIS — N3946 Mixed incontinence: Secondary | ICD-10-CM

## 2024-07-17 MED ORDER — MIRABEGRON ER 50 MG PO TB24: 50.0000 mg | ORAL_TABLET | Freq: Every day | ORAL | 0 refills | Status: AC

## 2024-07-17 NOTE — Progress Notes (Signed)
 PROVIDER NOTE: Interpretation of information contained herein should be left to medically-trained personnel. Specific patient instructions are provided elsewhere under Patient Instructions section of medical record. This document was created in part using AI and STT-dictation technology, any transcriptional errors that may result from this process are unintentional.  Patient: Brandi [name]  Service: E/M   PCP: Rudolpho Norleen BIRCH, MD  DOB: 1935-09-14  DOS: 07/21/2024  Provider: Eric DELENA Como, MD  MRN: [id number]  Delivery: Virtual Visit  Specialty: Interventional Pain Management  Type: Established Patient  Setting: Ambulatory outpatient facility  Specialty designation: 09  Referring Prov.: Rudolpho Norleen BIRCH, MD  Location: Remote location       Virtual Encounter - Pain Management PROVIDER NOTE: Information contained herein reflects review and annotations entered in association with encounter. Interpretation of such information and data should be left to medically-trained personnel. Information provided to patient can be located elsewhere in the medical record under Patient Instructions. Document created using STT-dictation technology, any transcriptional errors that may result from process are unintentional.    Contact & Pharmacy Preferred: [phone] Home: [phone] (home) Mobile: [phone] (mobile) E-mail: No e-mail address on record  [id number] [place], [place] - [address] AT [facility] [address] [place] [address] Phone: [phone] Fax: [phone]   Pre-screening  Brandi [name] offered in-person vs virtual encounter. She indicated preferring virtual for this encounter.   Reason COVID-19*  Social distancing based on CDC and AMA recommendations.   I contacted Brandi [name] on 07/21/2024 via telephone.      I clearly identified myself as Eric DELENA Como, MD. I verified that I was speaking with the correct person  using two identifiers (Name: EARLIE ARCIGA, and date of birth: 1936/01/23).  Consent I sought verbal advanced consent from Brandi [name] for virtual visit interactions. I informed Brandi [name] of possible security and privacy concerns, risks, and limitations associated with providing not-in-person medical evaluation and management services. I also informed Brandi [name] of the availability of in-person appointments. Finally, I informed her that there would be a charge for the virtual visit and that she could be  personally, fully or partially, financially responsible for it. Brandi [name] expressed understanding and agreed to proceed.   Historic Elements   Brandi [name] is a 89 y.o. year old, female patient evaluated today after our last contact on 06/10/2024. Ms. Stelmach  has a past medical history of Acquired cyst of kidney, Anemia, Anxiety, Aortic atherosclerosis, Arthritis, degenerative, Atrial fibrillation and flutter (HCC), Baden-Walker grade 3 cystocele, Chronic cystitis, Coronary artery disease (08/05/2010), Depression, Diabetic neuropathy (HCC), Dyspnea, GERD (gastroesophageal reflux disease), Headache, History of left atrial appendage closure (05/29/2018), HLD (hyperlipidemia), HTN (hypertension), Implantable loop recorder present (09/20/2016), Insomnia, Murmur, NSTEMI (non-ST elevated myocardial infarction) (HCC) (05/20/2018), NSVT (nonsustained ventricular tachycardia) (HCC) (01/2022), Obesity, OSA on CPAP, PONV (postoperative nausea and vomiting), Presence of drug coated stent in LAD coronary artery, PSVT (paroxysmal supraventricular tachycardia) (01/2022), 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 She also  has a past surgical history that includes Cholecystectomy; Total knee arthroplasty (Bilateral); Septoplasty; Bunionectomy; LOOP RECORDER INSERTION (N/A,  09/20/2016); Colonoscopy with propofol  (N/A, 06/14/2017); Breast biopsy (Right, 2004); LEFT HEART CATH AND CORONARY ANGIOGRAPHY (N/A, 05/22/2018); XI Robotic assisted inguinal hernia repair with mesh (Left, 10/14/2020); Coronary artery bypass graft (N/A, 05/29/2018);  LEFT ATRIAL APPENDAGE OCCLUSION (Left, 05/29/2018); Coronary angioplasty with stent (Left, 08/05/2010); LEFT HEART CATH AND CORONARY ANGIOGRAPHY (Left, 07/03/2011); LEFT HEART CATH AND CORONARY ANGIOGRAPHY (Left, 05/22/2018); and Hysteroscopy with D & C (N/A, 12/14/2022). Brandi [name] has a current medication list which includes the following prescription(s): acetaminophen , amlodipine , aspirin  ec, atorvastatin , budesonide-formoterol , carboxymethylcellulose, carvedilol , ciprofloxacin , cyanocobalamin , ferrous sulfate , furosemide , gabapentin , lantus  solostar, ipratropium, janumet  xr, losartan , melatonin, mirabegron  er, preservision/lutein , nitroglycerin , and sodium chloride . She  reports that she has never smoked. She has never used smokeless tobacco. She reports that she does not drink alcohol  and does not use drugs. Brandi [name] is allergic to accupril [quinapril hcl], micardis [telmisartan], and quinapril.  BMI: Estimated body mass index is 32.84 kg/m as calculated from the following:   Height as of 06/23/24: 5' 1 (1.549 m).   Weight as of 06/23/24: 173 lb 12.8 oz (78.8 kg). Last encounter: 05/19/2024. Last procedure: 06/10/2024.  HPI  Today, she is being contacted for a post-procedure assessment.  Post-Procedure Evaluation   Type: Lumbar Facet, Medial Branch Block(s) #2 (w/o STEROIDS) Laterality: Bilateral  Level: L2, L3, L4, L5, and S1 Medial Branch/Dorsal Rami Level(s). Injecting these levels blocks the L3-4, L4-5, and L5-S1 lumbar facet joints. Imaging: Fluoroscopic guidance Spinal ([id number]) Anesthesia: Local anesthesia (1-2% Lidocaine ) Anxiolysis: IV Versed  1.0 mg            Sedation: No Sedation                       DOS:  06/10/2024 Performed by: Eric DELENA Como, MD  Primary Purpose: Diagnostic/Therapeutic Indications: Low back pain severe enough to impact quality of life or function. 1. Chronic low back pain (Bilateral) w/o sciatica   2. Grade 1 Anterolisthesis of lumbosacral spine of L3/L4 and L5/S1   3. Low back pain of over 3 months duration   4. Lumbar facet hypertrophy (Multilevel) (Bilateral)   5. Lumbar facet joint pain   6. Lumbar facet joint syndrome   7. Spondylosis without myelopathy or radiculopathy, lumbosacral region    NAS-11 Pain score:   Pre-procedure: 3 /10   Post-procedure: 0-No pain/10     Effectiveness:  Initial hour after procedure: 100 %. Subsequent 4-6 hours post-procedure: 100 %. Analgesia past initial 6 hours: 60 %. Ongoing improvement:  Analgesic: The patient indicates having attained 100% relief of the pain for the duration of the local anesthetic which in fact despite the fact that she did not get any steroids has decreased down to 60% that seems to be ongoing.  At this point she refers that she is doing good enough that she does not need anything for now and therefore we will see her on a as needed basis. Function: Brandi [name] reports improvement in function ROM: Brandi [name] reports improvement in ROM Interpretation: The results of the above treatment again confirmed that most of her pain is coming from the lumbar facet joints and that blocking and treatment of those joints seem to be a reasonable way of managing her pain.  Pharmacotherapy Assessment  Opioid Analgesic: No chronic opioid analgesics therapy prescribed by our practice. None MME/day: 0 mg/day   Monitoring: [place] PMP: PDMP not reviewed this encounter.       Pharmacotherapy: No side-effects or adverse reactions reported. Compliance: No problems identified. Effectiveness: Clinically acceptable. Plan: Refer to POC.  UDS: No results found for: SUMMARY No results found for: CBDTHCR, D8THCCBX,  D9THCCBX  Laboratory Chemistry Profile   Renal Lab Results  Component Value Date  BUN 13 06/05/2024   CREATININE 0.97 06/05/2024   LABCREA 29 04/13/2020   GFRAA 54 (L) 05/24/2018   GFRNONAA 56 (L) 06/05/2024    Hepatic Lab Results  Component Value Date   AST 21 06/05/2024   ALT 13 06/05/2024   ALBUMIN 3.6 06/05/2024   ALKPHOS 76 06/05/2024   LIPASE 22 10/27/2023    Electrolytes Lab Results  Component Value Date   NA 137 06/05/2024   K 3.7 06/05/2024   CL 99 06/05/2024   CALCIUM  9.0 06/05/2024   MG 1.8 03/29/2024   PHOS 1.7 (L) 11/19/2023    Bone Lab Results  Component Value Date   25OHVITD1 68 08/15/2023   25OHVITD2 <1.0 08/15/2023   25OHVITD3 68 08/15/2023    Inflammation (CRP: Acute Phase) (ESR: Chronic Phase) Lab Results  Component Value Date   CRP 2 08/15/2023   ESRSEDRATE 2 08/15/2023   LATICACIDVEN 1.1 11/17/2023         Note: Above Lab results reviewed.  Imaging  DG PAIN CLINIC C-ARM 1-60 MIN NO REPORT Fluoro was used, but no Radiologist interpretation will be provided.  Please refer to NOTES tab for provider progress note.  Assessment  The primary encounter diagnosis was Chronic low back pain (Bilateral) w/o sciatica. Diagnoses of Lumbar facet joint pain, Lumbar facet joint syndrome, Low back pain of over 3 months duration, and Postop check were also pertinent to this visit.  Plan of Care  Problem-specific:  No problem-specific Assessment & Plan notes found for this encounter.  Brandi [name] has a current medication list which includes the following long-term medication(s): amlodipine , budesonide-formoterol , carvedilol , gabapentin , lantus  solostar, ipratropium, losartan , mirabegron  er, and sodium chloride .  Pharmacotherapy (Medications Ordered): No orders of the defined types were placed in this encounter.  Orders:  Orders Placed This Encounter  Procedures   Nursing Instructions:    Please complete this patient's postprocedure  evaluation.    Scheduling Instructions:     Please complete this patient's postprocedure evaluation.   Follow-up plan:   Return if symptoms worsen or fail to improve.      Interventional Therapies  Risk Factors  Considerations  Medical Comorbidities:  T2IDDM  MO (BMI>35)  CAD s/p CABG x 3  Hx A-Fib/Flutter  Hx. Orthostatic Hypotension  Stage 3 CKD  GERD  HTN  Cystocele Grade 3  Incontinence     Planned  Pending:      Under consideration:   Diagnostic right sacroiliac joint BLK #2  Diagnostic right lumbar facet MBB #3    Completed:   Diagnostic right SI joint Blk x1 (08/21/2023) (100/100/0/0) (Requested by referring NS: Dr. CHRISTELLA. CHRISTELLA. Abd-El-Barr ) Diagnostic/therapeutic right L4-5 LESI x1 (10/16/2023) (0/100/25 x 2 weeks/LBP:0LEP:100)  Diagnostic bilateral lumbar facet (L2-S1) MBB x2 (06/10/2024) (100/100/60)    Therapeutic  Palliative (PRN) options:   Diagnostic (L)LE NCT (EMG/PNCV) (03/07/2021) by Arthea Farrow, MD ([facility]) Dx: Electrodiagnostic evidence of chronic severe sensorimotor polyneuropathy.   Completed by other providers:   Therapeutic right L4-5 TFESI x3 (05/24/2017, 06/29/2017, 08/30/2017) by Morene Falcon, DO [facility])  Therapeutic right SI joint inj. x4 (09/28/2016, 02/20/2017, 12/04/2017, 01/24/2018) by Morene Falcon, DO [facility] PMR)  Diagnostic left IA hip joint inj. x1 (06/10/2015) by Morene Falcon, DO [facility] PMR)        Recent Visits Date Type Provider Dept  06/23/24 Office Visit Patel, Seema K, NP [facility]  06/10/24 Procedure visit Tanya Glisson, MD [facility]  05/19/24 Office Visit Tanya Glisson, MD [facility]  04/24/24 Procedure visit Tanya Glisson, MD [facility]  Showing recent visits within past 90 days and meeting all other requirements Today's Visits Date Type Provider Dept  07/21/24 Office Visit Tanya Glisson, MD [facility]  Showing today's visits  and meeting all other requirements Future Appointments No visits were found meeting these conditions. Showing future appointments within next 90 days and meeting all other requirements  I discussed the assessment and treatment plan with the patient. The patient was provided an opportunity to ask questions and all were answered. The patient agreed with the plan and demonstrated an understanding of the instructions.  Patient advised to call back or seek an in-person evaluation if the symptoms or condition worsens.  Duration of encounter: 11 minutes.  Note by: Glisson DELENA Tanya, MD Date: 07/21/2024; Time: 10:42 AM

## 2024-07-18 ENCOUNTER — Encounter: Payer: Self-pay | Admitting: Pain Medicine

## 2024-07-21 ENCOUNTER — Ambulatory Visit: Admitting: Pain Medicine

## 2024-07-21 DIAGNOSIS — M5459 Other low back pain: Secondary | ICD-10-CM

## 2024-07-21 DIAGNOSIS — Z09 Encounter for follow-up examination after completed treatment for conditions other than malignant neoplasm: Secondary | ICD-10-CM | POA: Diagnosis not present

## 2024-07-21 DIAGNOSIS — M47816 Spondylosis without myelopathy or radiculopathy, lumbar region: Secondary | ICD-10-CM

## 2024-07-21 DIAGNOSIS — G8929 Other chronic pain: Secondary | ICD-10-CM

## 2024-07-21 DIAGNOSIS — M545 Low back pain, unspecified: Secondary | ICD-10-CM | POA: Diagnosis not present

## 2024-07-22 ENCOUNTER — Ambulatory Visit

## 2024-07-24 ENCOUNTER — Ambulatory Visit

## 2024-07-24 DIAGNOSIS — R41841 Cognitive communication deficit: Secondary | ICD-10-CM

## 2024-07-24 NOTE — Therapy (Signed)
 " OUTPATIENT SPEECH LANGUAGE PATHOLOGY  TREATMENT   Patient Name: Brandi [name] MRN: [id number] DOB:21-Jul-1935, 89 y.o., female Today's Date: 07/24/2024  PCP: Norleen Rower, MD REFERRING PROVIDER: Arthea Farrow, MD   End of Session - 07/24/24 1056     Visit Number 2    Number of Visits 4    Date for Recertification  08/13/24    SLP Start Time 1100    SLP Stop Time  1145    SLP Time Calculation (min) 45 min    Activity Tolerance Patient tolerated treatment well          Patient Active Problem List   Diagnosis Date Noted   Chronic low back pain (Bilateral) w/o sciatica 04/23/2024   Low back pain of over 3 months duration 04/23/2024   Multifactorial low back pain 04/23/2024   Hypokalemia 03/27/2024   Paroxysmal atrial fibrillation (HCC) 03/26/2024   Hypertensive urgency 03/25/2024   Hyperlipidemia, unspecified 03/25/2024   Type 2 diabetes mellitus with peripheral neuropathy (HCC) 03/25/2024   Headache 03/25/2024   PAC (premature atrial contraction) 03/25/2024   Obesity (BMI 30-39.9) 03/25/2024   Abnormal NCS (nerve conduction studies) (03/07/2021) 02/11/2024   Polyneuropathy, peripheral sensorimotor axonal 02/11/2024   Diabetic sensorimotor polyneuropathy (HCC) 02/11/2024   Chronic painful diabetic neuropathy (HCC) 02/11/2024   SIRS (systemic inflammatory response syndrome) (HCC) 11/17/2023   Essential hypertension 11/14/2023   Spondylosis without myelopathy or radiculopathy, lumbosacral region 11/13/2023   Chronic low back pain (Right) w/o sciatica 10/30/2023   Lumbar facet joint syndrome 10/30/2023   Chronic lower extremity pain (2ry area of Pain) (Right) 09/04/2023   Lumbosacral radiculopathy at L5 (Right) 09/04/2023   Lumbosacral radiculopathy at S1 (Right) 09/04/2023   DDD, lumbosacral w/ LBP & LEP 09/04/2023   Lumbar facet hypertrophy (Multilevel) (Bilateral) 09/04/2023   Lumbar foraminal stenosis 09/04/2023   Chronic pain syndrome 08/15/2023   Pharmacologic  therapy 08/15/2023   Disorder of skeletal system 08/15/2023   Problems influencing health status 08/15/2023   Hypocalcemia 08/15/2023   Decreased GFR 08/15/2023   Elevated hemoglobin A1c 08/15/2023   Abnormal CT scan, cervical spine (07/24/2021) 08/15/2023   Abnormal MRI, lumbar spine (05/09/2023) 08/15/2023   Chronic low back pain (1ry area of Pain) (Bilateral) (R>L) w/ sciatica (Right) 08/15/2023   Low back pain radiating to leg (Right) 08/15/2023   Pain of lateral upper thigh (Right) (Intermittent) 08/15/2023   Chronic sacroiliac joint pain (Right) 08/15/2023   Somatic dysfunction of sacroiliac joint (Right) 08/15/2023   Grade 1 Anterolisthesis of lumbosacral spine of L3/L4 and L5/S1 08/15/2023   Lumbar facet joint pain 08/15/2023   Hypertensive emergency 05/10/2023   Peripheral vertigo 05/10/2023   Acute sinusitis 05/10/2023   A-fib s/p ligation of left atrial appendage 05/10/2023   Thickened endometrium 09/13/2022   Anemia of chronic disease 02/05/2022   Status post laparoscopic hernia repair 10/28/2020   AKI (acute kidney injury) 04/13/2020   Acute hyponatremia 04/13/2020   Dehydration 04/13/2020   Diarrhea 04/13/2020   Abdominal pain 04/13/2020   Recurrent syncope 03/28/2020   CAD s/p CABG (coronary artery disease) 03/28/2020   Chronic kidney disease, stage III (moderate) (HCC) 02/12/2019   Background diabetic retinopathy associated with type 2 diabetes mellitus (HCC) 12/06/2018   Decreased activities of daily living (ADL) 06/05/2018   Weakness 06/05/2018   S/P CABG x 3 06/04/2018   Presence of drug coated stent in LAD coronary artery 05/27/2018   Anxiety 05/25/2018   H/O gastroesophageal reflux (GERD) 05/25/2018   Hyperlipidemia 05/25/2018  Unstable angina (HCC) 05/21/2018   Chest pain 05/20/2018   Sacroiliitis 02/21/2018   Positional lightheadedness 03/14/2017   Diabetes mellitus type 2, insulin  dependent (HCC) 03/14/2017   Action tremor 09/11/2016   Headache  disorder 09/11/2016   Severe sepsis (HCC) 04/14/2016   Orthostatic hypotension 11/10/2015   Diabetic neuropathy (HCC) 10/06/2015   Urinary tract infection without hematuria 09/13/2015   Cystocele, grade 3 09/13/2015   Incontinence 09/13/2015   Depression, major, in remission 12/23/2014   Pernicious anemia 12/23/2014   Coronary arteriosclerosis 01/06/2014   OSA on CPAP 01/06/2014   Benign essential hypertension 12/01/2013   Severe obesity (BMI 35.0-39.9) with comorbidity (HCC) 12/01/2013   Acquired cyst of kidney 05/02/2013   Chronic cystitis 03/11/2012   Urge incontinence 03/11/2012    ONSET DATE: referral date 06/30/24   REFERRING DIAG: memory loss; wordfinding difficulty  THERAPY DIAG:  Cognitive communication deficit  Rationale for Evaluation and Treatment Rehabilitation  SUBJECTIVE:   SUBJECTIVE STATEMENT: Pt alert, pleasant, and cooperative.  Pt accompanied by: family member; Rad (son)  PERTINENT HISTORY & DIAGNOSTIC FINDINGS: Per Neurology, ARYIA DELIRA is an 89 year old female with orthostatic hypotension and recurrent syncope... She experiences intermittent headaches, often starting in the morning. Frequent nocturia disrupts her sleep, and she wakes up early. She has a history of sleep apnea but is not using a CPAP machine due to past skin cancer treatment on her head. She takes gabapentin  400 mg three times a day for low back pain, headaches, and tremor. Tremors are manageable except when eating, requiring her to use a spoon or be assisted by her son when dining out. Weighted silverware helps at home but is impractical when eating out. Her memory is generally good, scoring 30 out of 30 on her last memory test, but she experiences significant word-finding difficulties. She needs to change topics during conversations when she cannot find the right word. 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 CT Head, 03/28/24, no  acute findings.   PAIN:  Are you having pain? No   FALLS: Has patient fallen in last 6 months?  Yes  LIVING ENVIRONMENT: Lives with: lives with their family Lives in: House/apartment  PLOF:  Level of assistance: Needed assistance with ADLs, Needed assistance with IADLS Employment: Retired   PATIENT GOALS   for communication to improve  OBJECTIVE:  TODAY'S TREATMENT:  Introduced anomia strategies including, but not limited to, stop/pause, circumlocution, gesturing, and synonym antonym use. Pt utilized with initial mod cues during barrier task, improving to rare-min cues during barrier task and barrier task with semantic constraints (Taboo).   PATIENT EDUCATION: Education details: as above Person educated: Patient and Child(ren) Education method: Heritage Manager; Handout Education comprehension: verbalized understanding and needs further education  HOME EXERCISE PROGRAM:        To be provided in upcoming sessions, as indicated    GOALS:  Goals reviewed with patient? Yes  SHORT TERM GOALS: Target date: 4 sessions  Pt will use preferred communication strategy (e.g. semantic features, circumlocution strategy, writing, drawing, and/or gesturing) to describe target words with 75% accuracy to improve word-finding and reduce communication breakdowns. Baseline: Goal status: INITIAL   2.  Pt will ID x3 normal changes to cognition in the aging brain.  Baseline:  Goal status: INITIAL  3.  Pt will ID x3 ways to promote cognitive functioning.  Baseline:  Goal status: INITIAL    LONG TERM GOALS: Target  date: 1 month  Pt will demonstrate understanding of normal changes to aging (vs red flags for cognitive decline) as well as ways to promote cognitive functioning.  Baseline:  Goal status: INITIAL    ASSESSMENT:  CLINICAL IMPRESSION:  Patient is a 89 y.o. female who was seen today for cognitive-communication tx. SYRINA WAKE is an 89 year old female with  orthostatic hypotension and recurrent syncope... She experiences intermittent headaches, often starting in the morning. Frequent nocturia disrupts her sleep, and she wakes up early. She has a history of sleep apnea but is not using a CPAP machine due to past skin cancer treatment on her head. She takes gabapentin  400 mg three times a day for low back pain, headaches, and tremor. Tremors are manageable except when eating, requiring her to use a spoon or be assisted by her son when dining out. Weighted silverware helps at home but is impractical when eating out. Her memory is generally good, scoring 30 out of 30 on her last memory test, but she experiences significant word-finding difficulties. She needs to change topics during conversations when she cannot find the right word. 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 CT Head, 03/28/24, no acute findings.   Assessment completed via informal means, standardized assessment (ACE-III), and PROM (CPIB). Pt scored 92/100 on ACE-III which is above the cutoff scores of 82 and 88. Pt with reports of changes to functional verbal communication including wordfinding difficulty which is impacting her QoL per PROM. Pt scored ~1 SD below average on PROM for cognition. Pt endorsed frustration with CLOF, but relief that such changes may be typical in aging.   See details of today's tx above.   Recommend brief course of ST for education re: anomia strategies as well as normal changes to cognition with aging (vs red flags for decline) and ways to promote cognitive functioning.   OBJECTIVE IMPAIRMENTS include expressive language. These impairments are limiting patient from effectively communicating at home and in community. Factors affecting potential to achieve goals and functional outcome are none.. Patient will benefit from skilled SLP services to address above impairments and improve overall function.  REHAB  POTENTIAL: Good  PLAN: SLP FREQUENCY: 1x/week  SLP DURATION: 4 weeks  PLANNED INTERVENTIONS: Language facilitation, Cueing hierachy, Internal/external aids, Functional tasks, Multimodal communication approach, SLP instruction and feedback, Compensatory strategies, and Patient/family education    Delon Bangs, M.S., CCC-SLP Speech-Language Pathologist [place] [facility] [phone] [place])   [facility] at [facility] [address] [place], [place], [address] Phone: [phone]   Fax:  [phone]            "

## 2024-07-29 ENCOUNTER — Ambulatory Visit

## 2024-07-31 ENCOUNTER — Ambulatory Visit

## 2024-08-04 ENCOUNTER — Ambulatory Visit

## 2024-08-06 ENCOUNTER — Ambulatory Visit

## 2024-08-12 ENCOUNTER — Ambulatory Visit

## 2024-08-13 ENCOUNTER — Ambulatory Visit: Admitting: Urology

## 2024-08-14 ENCOUNTER — Ambulatory Visit

## 2024-08-19 ENCOUNTER — Ambulatory Visit

## 2024-08-21 ENCOUNTER — Ambulatory Visit

## 2024-08-26 ENCOUNTER — Ambulatory Visit

## 2024-08-28 ENCOUNTER — Ambulatory Visit

## 2024-09-02 ENCOUNTER — Ambulatory Visit

## 2024-09-04 ENCOUNTER — Ambulatory Visit

## 2024-09-09 ENCOUNTER — Ambulatory Visit

## 2024-09-11 ENCOUNTER — Ambulatory Visit

## 2024-09-16 ENCOUNTER — Ambulatory Visit

## 2024-09-18 ENCOUNTER — Ambulatory Visit
